# Patient Record
Sex: Female | Born: 1940
Health system: Southern US, Community
[De-identification: ages and names within clinical notes are randomized; demographics above are authoritative.]

## PROBLEM LIST (undated history)

## (undated) DIAGNOSIS — D649 Anemia, unspecified: Secondary | ICD-10-CM

## (undated) DIAGNOSIS — E349 Endocrine disorder, unspecified: Secondary | ICD-10-CM

## (undated) DIAGNOSIS — I428 Other cardiomyopathies: Secondary | ICD-10-CM

## (undated) DIAGNOSIS — I509 Heart failure, unspecified: Secondary | ICD-10-CM

## (undated) DIAGNOSIS — N189 Chronic kidney disease, unspecified: Secondary | ICD-10-CM

## (undated) DIAGNOSIS — M1990 Unspecified osteoarthritis, unspecified site: Secondary | ICD-10-CM

## (undated) DIAGNOSIS — I429 Cardiomyopathy, unspecified: Secondary | ICD-10-CM

## (undated) DIAGNOSIS — K219 Gastro-esophageal reflux disease without esophagitis: Secondary | ICD-10-CM

## (undated) DIAGNOSIS — I2510 Atherosclerotic heart disease of native coronary artery without angina pectoris: Secondary | ICD-10-CM

## (undated) DIAGNOSIS — Z8739 Personal history of other diseases of the musculoskeletal system and connective tissue: Secondary | ICD-10-CM

## (undated) DIAGNOSIS — G63 Polyneuropathy in diseases classified elsewhere: Secondary | ICD-10-CM

## (undated) DIAGNOSIS — I10 Essential (primary) hypertension: Secondary | ICD-10-CM

## (undated) HISTORY — PX: NEUROPLASTY / TRANSPOSITION MEDIAN NERVE AT CARPAL TUNNEL: SUR893

## (undated) HISTORY — PX: APPENDECTOMY: SHX54

## (undated) HISTORY — DX: Personal history of other diseases of the musculoskeletal system and connective tissue: Z87.39

## (undated) HISTORY — DX: Heart failure, unspecified: I50.9

## (undated) HISTORY — DX: Anemia, unspecified: D64.9

## (undated) HISTORY — DX: Essential (primary) hypertension: I10

## (undated) HISTORY — PX: FOOT SURGERY: SHX648

---

## 1967-11-21 HISTORY — PX: CHOLECYSTECTOMY: SHX55

## 1969-11-20 HISTORY — PX: ABDOMINAL HYSTERECTOMY: SHX81

## 2007-11-21 HISTORY — PX: BACK SURGERY: SHX140

## 2010-11-22 ENCOUNTER — Ambulatory Visit (HOSPITAL_BASED_OUTPATIENT_CLINIC_OR_DEPARTMENT_OTHER): Payer: Medicare PPO | Admitting: Hematology and Oncology

## 2010-12-22 ENCOUNTER — Encounter (HOSPITAL_BASED_OUTPATIENT_CLINIC_OR_DEPARTMENT_OTHER): Payer: Medicare PPO | Admitting: Hematology and Oncology

## 2010-12-22 DIAGNOSIS — D509 Iron deficiency anemia, unspecified: Secondary | ICD-10-CM

## 2010-12-22 LAB — URINALYSIS, MICROSCOPIC - CHCC
Bilirubin (Urine): NEGATIVE
Leukocyte Esterase: NEGATIVE
RBC count: NEGATIVE (ref 0–2)
WBC, UA: NEGATIVE (ref 0–2)
pH: 5 (ref 4.6–8.0)

## 2010-12-22 LAB — CBC & DIFF AND RETIC
Basophils Absolute: 0 10*3/uL (ref 0.0–0.1)
EOS%: 1 % (ref 0.0–7.0)
Eosinophils Absolute: 0.1 10*3/uL (ref 0.0–0.5)
HCT: 30.9 % — ABNORMAL LOW (ref 34.8–46.6)
HGB: 10.2 g/dL — ABNORMAL LOW (ref 11.6–15.9)
Immature Retic Fract: 7 % (ref 0.00–10.70)
MCH: 29.1 pg (ref 25.1–34.0)
MONO#: 0.3 10*3/uL (ref 0.1–0.9)
NEUT#: 3.1 10*3/uL (ref 1.5–6.5)
RDW: 15.2 % — ABNORMAL HIGH (ref 11.2–14.5)
Retic %: 0.63 % (ref 0.50–1.50)
Retic Ct Abs: 22.11 10*3/uL (ref 18.30–72.70)
WBC: 4.8 10*3/uL (ref 3.9–10.3)
lymph#: 1.4 10*3/uL (ref 0.9–3.3)

## 2010-12-22 LAB — MORPHOLOGY: PLT EST: ADEQUATE

## 2010-12-26 LAB — PROTEIN ELECTROPHORESIS, SERUM, WITH REFLEX
Albumin ELP: 56.4 % (ref 55.8–66.1)
Alpha-1-Globulin: 4.3 % (ref 2.9–4.9)
Alpha-2-Globulin: 7.7 % (ref 7.1–11.8)
Gamma Globulin: 19.4 % — ABNORMAL HIGH (ref 11.1–18.8)
Total Protein, Serum Electrophoresis: 7.3 g/dL (ref 6.0–8.3)

## 2010-12-26 LAB — COMPREHENSIVE METABOLIC PANEL
ALT: 10 U/L (ref 0–35)
AST: 15 U/L (ref 0–37)
Creatinine, Ser: 1.35 mg/dL — ABNORMAL HIGH (ref 0.40–1.20)
Total Bilirubin: 0.4 mg/dL (ref 0.3–1.2)

## 2010-12-26 LAB — IRON AND TIBC
Iron: 51 ug/dL (ref 42–145)
UIBC: 243 ug/dL

## 2010-12-26 LAB — DIRECT ANTIGLOBULIN TEST (NOT AT ARMC)
DAT (Complement): NEGATIVE
DAT IgG: NEGATIVE

## 2010-12-26 LAB — HEMOGLOBINOPATHY EVALUATION
Hemoglobin Other: 0 % (ref 0.0–0.0)
Hgb A: 97.6 % (ref 96.8–97.8)

## 2010-12-26 LAB — LACTATE DEHYDROGENASE: LDH: 171 U/L (ref 94–250)

## 2010-12-28 ENCOUNTER — Encounter (HOSPITAL_BASED_OUTPATIENT_CLINIC_OR_DEPARTMENT_OTHER): Payer: Medicare PPO | Admitting: Hematology and Oncology

## 2010-12-28 DIAGNOSIS — D509 Iron deficiency anemia, unspecified: Secondary | ICD-10-CM

## 2011-01-04 ENCOUNTER — Encounter (HOSPITAL_BASED_OUTPATIENT_CLINIC_OR_DEPARTMENT_OTHER): Payer: Medicare PPO | Admitting: Hematology and Oncology

## 2011-01-04 DIAGNOSIS — D509 Iron deficiency anemia, unspecified: Secondary | ICD-10-CM

## 2011-03-27 ENCOUNTER — Other Ambulatory Visit: Payer: Self-pay | Admitting: Hematology and Oncology

## 2011-03-27 ENCOUNTER — Encounter (HOSPITAL_BASED_OUTPATIENT_CLINIC_OR_DEPARTMENT_OTHER): Payer: Medicare PPO | Admitting: Hematology and Oncology

## 2011-03-27 DIAGNOSIS — D509 Iron deficiency anemia, unspecified: Secondary | ICD-10-CM

## 2011-03-27 LAB — CBC WITH DIFFERENTIAL/PLATELET
BASO%: 0.2 % (ref 0.0–2.0)
Basophils Absolute: 0 10e3/uL (ref 0.0–0.1)
EOS%: 1.2 % (ref 0.0–7.0)
Eosinophils Absolute: 0.1 10e3/uL (ref 0.0–0.5)
HCT: 30.3 % — ABNORMAL LOW (ref 34.8–46.6)
HGB: 10.1 g/dL — ABNORMAL LOW (ref 11.6–15.9)
LYMPH%: 30.8 % (ref 14.0–49.7)
MCH: 29.3 pg (ref 25.1–34.0)
MCHC: 33.3 g/dL (ref 31.5–36.0)
MCV: 87.8 fL (ref 79.5–101.0)
MONO#: 0.3 10e3/uL (ref 0.1–0.9)
MONO%: 7.2 % (ref 0.0–14.0)
NEUT#: 2.6 10e3/uL (ref 1.5–6.5)
NEUT%: 60.6 % (ref 38.4–76.8)
Platelets: 184 10e3/uL (ref 145–400)
RBC: 3.45 10e6/uL — ABNORMAL LOW (ref 3.70–5.45)
RDW: 15.8 % — ABNORMAL HIGH (ref 11.2–14.5)
WBC: 4.3 10e3/uL (ref 3.9–10.3)
lymph#: 1.3 10e3/uL (ref 0.9–3.3)
nRBC: 0 % (ref 0–0)

## 2011-03-27 LAB — IRON AND TIBC
%SAT: 22 % (ref 20–55)
Iron: 54 ug/dL (ref 42–145)
TIBC: 247 ug/dL — ABNORMAL LOW (ref 250–470)
UIBC: 193 ug/dL

## 2011-03-27 LAB — BASIC METABOLIC PANEL
BUN: 33 mg/dL — ABNORMAL HIGH (ref 6–23)
CO2: 25 mEq/L (ref 19–32)
Chloride: 107 mEq/L (ref 96–112)
Glucose, Bld: 110 mg/dL — ABNORMAL HIGH (ref 70–99)
Potassium: 3.9 mEq/L (ref 3.5–5.3)

## 2011-03-27 LAB — FERRITIN: Ferritin: 106 ng/mL (ref 10–291)

## 2011-03-29 ENCOUNTER — Encounter (HOSPITAL_BASED_OUTPATIENT_CLINIC_OR_DEPARTMENT_OTHER): Payer: Medicare PPO | Admitting: Hematology and Oncology

## 2011-03-29 DIAGNOSIS — D509 Iron deficiency anemia, unspecified: Secondary | ICD-10-CM

## 2011-04-01 ENCOUNTER — Emergency Department (HOSPITAL_COMMUNITY)
Admission: EM | Admit: 2011-04-01 | Discharge: 2011-04-02 | Disposition: A | Discharge status: Home / Self Care | Payer: Medicare PPO | Attending: Emergency Medicine | Admitting: Emergency Medicine

## 2011-04-01 ENCOUNTER — Emergency Department (HOSPITAL_COMMUNITY): Payer: Medicare PPO

## 2011-04-01 DIAGNOSIS — M545 Low back pain, unspecified: Secondary | ICD-10-CM | POA: Insufficient documentation

## 2011-04-01 DIAGNOSIS — X500XXA Overexertion from strenuous movement or load, initial encounter: Secondary | ICD-10-CM | POA: Insufficient documentation

## 2011-04-01 DIAGNOSIS — T148XXA Other injury of unspecified body region, initial encounter: Secondary | ICD-10-CM | POA: Insufficient documentation

## 2011-04-01 DIAGNOSIS — R609 Edema, unspecified: Secondary | ICD-10-CM | POA: Insufficient documentation

## 2011-04-01 DIAGNOSIS — E119 Type 2 diabetes mellitus without complications: Secondary | ICD-10-CM | POA: Insufficient documentation

## 2011-04-01 DIAGNOSIS — I10 Essential (primary) hypertension: Secondary | ICD-10-CM | POA: Insufficient documentation

## 2011-06-14 ENCOUNTER — Inpatient Hospital Stay (HOSPITAL_COMMUNITY)
Admission: EM | Admit: 2011-06-14 | Discharge: 2011-06-16 | DRG: 683 | Disposition: A | Discharge status: Home / Self Care | Payer: Medicare HMO | Attending: Family Medicine | Admitting: Family Medicine

## 2011-06-14 DIAGNOSIS — D649 Anemia, unspecified: Secondary | ICD-10-CM | POA: Diagnosis present

## 2011-06-14 DIAGNOSIS — E871 Hypo-osmolality and hyponatremia: Secondary | ICD-10-CM | POA: Diagnosis present

## 2011-06-14 DIAGNOSIS — N390 Urinary tract infection, site not specified: Secondary | ICD-10-CM | POA: Diagnosis present

## 2011-06-14 DIAGNOSIS — E119 Type 2 diabetes mellitus without complications: Secondary | ICD-10-CM | POA: Diagnosis present

## 2011-06-14 DIAGNOSIS — R197 Diarrhea, unspecified: Secondary | ICD-10-CM | POA: Diagnosis present

## 2011-06-14 DIAGNOSIS — N179 Acute kidney failure, unspecified: Principal | ICD-10-CM | POA: Diagnosis present

## 2011-06-14 LAB — CBC
HCT: 32.1 % — ABNORMAL LOW (ref 36.0–46.0)
MCHC: 34 g/dL (ref 30.0–36.0)
MCV: 85.8 fL (ref 78.0–100.0)
Platelets: 181 10*3/uL (ref 150–400)
RDW: 14.1 % (ref 11.5–15.5)

## 2011-06-14 LAB — DIFFERENTIAL
Basophils Absolute: 0 10*3/uL (ref 0.0–0.1)
Lymphocytes Relative: 32 % (ref 12–46)
Lymphs Abs: 1.4 10*3/uL (ref 0.7–4.0)
Neutrophils Relative %: 53 % (ref 43–77)

## 2011-06-15 ENCOUNTER — Emergency Department (HOSPITAL_COMMUNITY): Payer: Medicare HMO

## 2011-06-15 LAB — COMPREHENSIVE METABOLIC PANEL
AST: 22 U/L (ref 0–37)
Albumin: 3.7 g/dL (ref 3.5–5.2)
BUN: 43 mg/dL — ABNORMAL HIGH (ref 6–23)
Creatinine, Ser: 3.09 mg/dL — ABNORMAL HIGH (ref 0.50–1.10)
Total Protein: 7.5 g/dL (ref 6.0–8.3)

## 2011-06-15 LAB — BASIC METABOLIC PANEL
CO2: 21 mEq/L (ref 19–32)
Calcium: 10 mg/dL (ref 8.4–10.5)
Creatinine, Ser: 2.82 mg/dL — ABNORMAL HIGH (ref 0.50–1.10)
GFR calc non Af Amer: 17 mL/min — ABNORMAL LOW (ref >60)

## 2011-06-15 LAB — HEMOGLOBIN A1C
Hgb A1c MFr Bld: 6.2 % — ABNORMAL HIGH (ref <5.7)
Mean Plasma Glucose: 131 mg/dL — ABNORMAL HIGH (ref <117)

## 2011-06-15 LAB — URINALYSIS, ROUTINE W REFLEX MICROSCOPIC
Glucose, UA: NEGATIVE mg/dL
Protein, ur: NEGATIVE mg/dL
Specific Gravity, Urine: 1.012 (ref 1.005–1.030)
pH: 5.5 (ref 5.0–8.0)

## 2011-06-15 LAB — URINE MICROSCOPIC-ADD ON

## 2011-06-15 LAB — LIPID PANEL
Cholesterol: 152 mg/dL (ref 0–200)
VLDL: 16 mg/dL (ref 0–40)

## 2011-06-15 LAB — GLUCOSE, CAPILLARY: Glucose-Capillary: 71 mg/dL (ref 70–99)

## 2011-06-16 LAB — GLUCOSE, CAPILLARY: Glucose-Capillary: 86 mg/dL (ref 70–99)

## 2011-06-16 LAB — BASIC METABOLIC PANEL
GFR calc Af Amer: 27 mL/min — ABNORMAL LOW (ref >60)
GFR calc non Af Amer: 22 mL/min — ABNORMAL LOW (ref >60)
Potassium: 3.2 mEq/L — ABNORMAL LOW (ref 3.5–5.1)
Sodium: 138 mEq/L (ref 135–145)

## 2011-06-16 LAB — DIFFERENTIAL
Basophils Absolute: 0 10*3/uL (ref 0.0–0.1)
Basophils Relative: 1 % (ref 0–1)
Eosinophils Relative: 1 % (ref 0–5)
Monocytes Absolute: 0.6 10*3/uL (ref 0.1–1.0)
Neutro Abs: 1.9 10*3/uL (ref 1.7–7.7)

## 2011-06-16 LAB — CBC
Hemoglobin: 9.5 g/dL — ABNORMAL LOW (ref 12.0–15.0)
MCHC: 34.2 g/dL (ref 30.0–36.0)
RDW: 14.4 % (ref 11.5–15.5)

## 2011-06-18 LAB — URINE CULTURE: Colony Count: 15000

## 2011-06-19 NOTE — H&P (Signed)
NAME:  ALEA, RYER NO.:  [id number]  MEDICAL RECORD NO.:  [id number]  LOCATION:  WLED                         FACILITY:  [facility]  PHYSICIAN:  Houston Siren, MD           DATE OF BIRTH:  July 13, 1941  DATE OF ADMISSION:  06/14/2011 DATE OF DISCHARGE:                             HISTORY & PHYSICAL   PRIMARY CARE PHYSICIAN:  Stacie Acres. Cliffton Asters, M.D.  ADVANCE DIRECTIVE:  Full code.  REASON FOR ADMISSION:  Acute renal failure.  HISTORY OF PRESENT ILLNESS:  This is a 70 year old female with history of diabetes type 2, hypertension, hypercholesterolemia who was recently started on metformin, developed watery diarrhea, presents to her primary care where a creatinine was found to be elevated.  She was subsequently sent to the emergency room where evaluation confirmed a creatinine of 3.0 along with concomitant serum sodium of 128.  Review of her records showed that she has been on captopril along with HCTZ.  She also has aurinalysis, which was positive for evidence of a urinary tract infection as well.  She clinically was dehydrated.  She denied any nonsteroidal anti-inflammatory drug use.  Hospitalist was asked to admit the patient because of acute renal failure.  PAST MEDICAL HISTORY: 1. Diabetes. 2. Hypertension. 3. Hypercholesterolemia. 4. Obesity.  FAMILY HISTORY:  Diabetes and hypertension.  SOCIAL HISTORY:  She is divorced with 3 children.  She is retired and denied tobacco, alcohol, or drug use.  ALLERGIES:  SULFA.  CURRENT MEDICATIONS:  Norvasc, captopril, HCTZ, omeprazole, pravastatin, ophthalmic tobramycin, vitamin D, metformin, and glipizide.  PHYSICAL EXAMINATION:  GENERAL:  She is alert and oriented and is in no apparent distress. VITAL SIGNS:  Blood pressure 140/57, pulse of 61, respiratory rate of 17, temperature 97.8. HEENT:  She has facial symmetry and fluent speech.  Tongue is midline. NECK:  Supple.  No carotid bruit. CARDIAC:  S1 and S2  regular.  I did not hear murmur, rub, or gallop. LUNGS:  Clear. ABDOMEN:  Slightly obese, nondistended, nontender.  No renal bruit. EXTREMITIES:  No edema.  Good distal pulses bilaterally.  No calf tenderness. SKIN:  Warm and dry.  She has no asterixis. NEUROLOGIC:  Unremarkable. PSYCHIATRIC:  Unremarkable.  OBJECTIVE FINDINGS:  Serum sodium 128, potassium 3.4, glucose 134, creatinine 3.04, BUN 43.  Liver function tests are normal.  White count of 4500, hemoglobin of 10.9, MCV of 85, platelet count of 181,000. Urinalysis shows that there is cloudy with moderate leukocyte esterase, many bacteria, and too numerous to count WBCs.  IMPRESSION AND PLAN:  This is a 70 year old with diabetes, hypertension, hypercholesterolemia, admitted for acute renal failure.  I suspect that this is prerenal, and it is likely because of the diuretic along with the diarrhea.  I think the diarrhea is indeed from the metformin.  She is volume depleted as evident with hyponatremia as well.  We will need to admit her and give normal saline.  I would discontinue metformin, hold Capoten, and discontinue HCTZ.  I suspect that her creatinine will normalize.  Even though it admittedly low yield, we will send stool for studies to include Clostridium difficile and C and S.  We will  treat her urinary tract infection with Rocephin.  She is stable and we will admit her to [facility].  She is a  full code.     Houston Siren, MD     PL/MEDQ  D:  06/15/2011  T:  06/15/2011  Job:  [id number]  Electronically Signed by Houston Siren  on 06/19/2011 03:57:46 AM

## 2011-06-29 NOTE — Discharge Summary (Signed)
NAME:  NICKOL, COLLISTER NO.:  [id number]  MEDICAL RECORD NO.:  [id number]  LOCATION:  1513                         FACILITY:  [facility]  PHYSICIAN:  Brendia Sacks, MD    DATE OF BIRTH:  July 26, 1941  DATE OF ADMISSION:  06/14/2011 DATE OF DISCHARGE:  06/16/2011                              DISCHARGE SUMMARY   PRIMARY CARE PHYSICIAN:  Stacie Acres. Cliffton Asters, M.D.  PRIMARY NEPHROLOGIST:  Wilber Bihari. Caryn Section, M.D.  CONDITION ON DISCHARGE:  Improved.  DISPOSITION:  Home.  DISCHARGE DIAGNOSES: 1. Acute renal failure. 2. Hyponatremia. 3. Diabetes mellitus type 2, stable. 4. Urinary tract infection. 5. Normocytic anemia.  HISTORY OF PRESENT ILLNESS:  This is a 70 year old woman who developed watery diarrhea and saw her primary care physician for the same. Laboratory studies revealed elevated creatinine, and she was referred to the emergency room for further evaluation for a creatinine of 3.0 and the sodium of 128.  Of note, patient is also on captopril and hydrochlorothiazide at that time.  Urinary tract infection was also noted.  HOSPITAL COURSE:  The patient was admitted to the medical floor.  She was placed on IV fluids.  She has had excellent urine output and creatinine continues to trend downwards.  Her electrolytes are otherwise unremarkable except for mild hypokalemia.  Metformin has been discontinued.  She has excellent control of her diabetes, will continue on glipizide alone.  Her hyponatremia has resolved with fluid repletion. Presumably, her renal failure is multifactorial in nature including diarrhea superimposed, on diuretic and ACE inhibitor therapy.  Her blood pressure is stable, on Norvasc at this point and her diuretic and ACE inhibitor have been discontinued at this time.  Her normocytic anemia appears to be stable, and she continued iron in the outpatient setting. She will followup with her primary care physician next week, at which time basic  metabolic panel is suggested to can follow her creatinine. Once her creatinine normalizes, consider reinstituting ACE inhibitor therapy.  CONSULTATIONS:  None.  PROCEDURES:  None.  IMAGING:  Renal ultrasound, June 15, 2011:  No hydronephrosis. Echogenicity of the renal parenchyma is within normal limits.  PERTINENT LABORATORY STUDIES: 1. TSH within normal limits. 2. Hemoglobin A1c is 6.2. 3. Hemoglobin stable at 9.5. 4. Capillary blood sugars well controlled. 5. BUN 32 and creatinine 2.19 on discharge.  On admission BUN was 43     and creatinine was 3.09. 6. Sodium was 128 on admission and is 138 on discharge.  PHYSICAL EXAMINATION ON DISCHARGE:  GENERAL:  The patient is feeling well.  She has had no diarrhea.  She has excellent urine output. VITAL SIGNS:  Temperature is 97.8, pulse 63, respirations 18, blood pressure 110/64, sat 100% on room air.  Urine output 1350 over the last 24 hours. CARDIOVASCULAR:  Regular rate and rhythm.  No murmurs, rubs, or gallops. RESPIRATORY:  Clear to auscultation bilaterally.  No wheezes, rales, or rhonchi.  She has normal respiratory effort.  She appears well.  DISCHARGE INSTRUCTIONS: 1. The patient will be discharged home today. 2. Her diet is a heart-healthy diabetic diet. 3. Activities unrestricted. 4. Followup with Dr. Laurann Montana in 1 week.  DISCHARGE MEDICATIONS:  Amoxicillin 875  mg p.o. b.i.d. for urinary tract infection.  First dose on June 09, 2011 at 8 a.m. Resume: 1. Amlodipine 10 mg p.o. daily. 2. Glipizide XL 2.5 mg p.o. daily. 3. Multivitamins p.o. daily. 4. NU - iron 150 mg p.o. daily. 5. Omeprazole 40 mg p.o. daily. 6. Pravastatin 40 mg p.o. daily. 7. Vitamin D3 2000 units p.o. daily.  DISCONTINUED MEDICATIONS:  Discontinue the following medications: 1. Metformin. 2. Captopril. 3. Hydrochlorothiazide.  Things followup in the outpatient setting: 1. Resolution of acute renal failure.  Consider reinstituting ACE      inhibitor therapy.  Defer to her primary care physician.  The     patient has been followed by Dr. Caryn Section in the outpatient setting.     Most recent creatinine was 1.29, June 05, 2011 per his office. 2. Normocytic anemia further evaluation as clinically indicated. 3. Three urine culture results.  Time coordinating discharge is 20 minutes.     Brendia Sacks, MD     DG/MEDQ  D:  06/16/2011  T:  06/16/2011  Job:  [id number]  cc:   Stacie Acres. Cliffton Asters, M.D. Fax: [phone]  Wilber Bihari. Caryn Section, M.D. Fax: [phone]  Electronically Signed by Brendia Sacks  on 06/29/2011 05:36:33 PM

## 2011-09-01 ENCOUNTER — Encounter: Payer: Self-pay | Admitting: *Deleted

## 2011-09-22 ENCOUNTER — Encounter: Payer: Self-pay | Admitting: *Deleted

## 2011-09-26 ENCOUNTER — Other Ambulatory Visit: Payer: Self-pay | Admitting: Hematology and Oncology

## 2011-09-26 ENCOUNTER — Other Ambulatory Visit (HOSPITAL_BASED_OUTPATIENT_CLINIC_OR_DEPARTMENT_OTHER): Payer: Medicare HMO | Admitting: Lab

## 2011-09-26 DIAGNOSIS — D509 Iron deficiency anemia, unspecified: Secondary | ICD-10-CM

## 2011-09-26 LAB — CBC WITH DIFFERENTIAL/PLATELET
BASO%: 0.5 % (ref 0.0–2.0)
Eosinophils Absolute: 0.1 10*3/uL (ref 0.0–0.5)
MCV: 91.2 fL (ref 79.5–101.0)
MONO#: 0.4 10*3/uL (ref 0.1–0.9)
MONO%: 9.2 % (ref 0.0–14.0)
NEUT#: 2.5 10*3/uL (ref 1.5–6.5)
RBC: 3.41 10*6/uL — ABNORMAL LOW (ref 3.70–5.45)
RDW: 15.1 % — ABNORMAL HIGH (ref 11.2–14.5)
WBC: 4.4 10*3/uL (ref 3.9–10.3)

## 2011-09-26 LAB — IRON AND TIBC
%SAT: 18 % — ABNORMAL LOW (ref 20–55)
TIBC: 277 ug/dL (ref 250–470)

## 2011-09-26 LAB — FERRITIN: Ferritin: 110 ng/mL (ref 10–291)

## 2011-09-26 LAB — COMPREHENSIVE METABOLIC PANEL
BUN: 29 mg/dL — ABNORMAL HIGH (ref 6–23)
CO2: 26 mEq/L (ref 19–32)
Calcium: 9.8 mg/dL (ref 8.4–10.5)
Chloride: 105 mEq/L (ref 96–112)
Creatinine, Ser: 1.45 mg/dL — ABNORMAL HIGH (ref 0.50–1.10)

## 2011-09-29 ENCOUNTER — Ambulatory Visit (HOSPITAL_BASED_OUTPATIENT_CLINIC_OR_DEPARTMENT_OTHER): Payer: Medicare HMO | Admitting: Hematology and Oncology

## 2011-09-29 VITALS — BP 115/67 | HR 79 | Temp 97.4°F | Ht 65.0 in | Wt 233.7 lb

## 2011-09-29 DIAGNOSIS — E119 Type 2 diabetes mellitus without complications: Secondary | ICD-10-CM

## 2011-09-29 DIAGNOSIS — N289 Disorder of kidney and ureter, unspecified: Secondary | ICD-10-CM

## 2011-09-29 DIAGNOSIS — D638 Anemia in other chronic diseases classified elsewhere: Secondary | ICD-10-CM

## 2011-09-29 DIAGNOSIS — D509 Iron deficiency anemia, unspecified: Secondary | ICD-10-CM

## 2011-09-29 DIAGNOSIS — D539 Nutritional anemia, unspecified: Secondary | ICD-10-CM

## 2011-09-29 NOTE — Progress Notes (Signed)
CC:   Mandy Rhodes, M.D. Richard F. Caryn Section, M.D. Shirley Friar, MD  IDENTIFYING STATEMENT:  The patient is a 70 year old woman with anemia who presents for followup.  INTERVAL HISTORY:  Mandy Rhodes was last seen 6 months ago.  Since that time has had no major concerns.  She has good energy levels and is able to complete all ADLs.  Has no evidence of bleeding.  Continues on Nu- Iron daily.  Has not had any change in her bowel function.  LABORATORY DATA:  Review of lab data obtained 09/26/2011:  White cell count 4.4, hemoglobin 10.3, hematocrit 31.1, platelets 205; BUN and creatinine 29 and 1.45 respectively; ferritin 110 (106), iron 49, TIBC 277, saturation 18%.  MEDICATIONS:  Medications documented and as in nursing intake sheet.  ALLERGIES:  Sulfa.  PHYSICAL EXAMINATION:  General:  Well-appearing, well-nourished woman in no distress.  Vitals:  Pulse 79, blood pressure 105/67, temperature 97.4, respirations 20, weight 233 pounds.  HEENT:  Head is atraumatic, normocephalic.  Sclerae anicteric.  Mouth moist.  Neck:  Supple. Chest/CVS:  Unremarkable.  Abdomen:  Soft.  Bowel sounds present. Extremities:  No edema.  LABORATORY DATA:  Lab data as above.  In addition, sodium 140, potassium 3.7, chloride 105, CO2 of 26, BUN 109, T-bili 0.2, alkaline phosphatase 112, AST 14, ALT 12, calcium 9.8.  IMPRESSION AND PLAN:  Mandy Rhodes is a 70 year old woman with mixture of iron deficiency and anemia of chronic disease.  She has underlying diabetes and also mild renal insufficiency.  She has more than adequate iron levels, thus will not benefit from IV iron at this time. She is to continue with oral iron.  She routinely follows up in 6 months' time.    ______________________________ Laurice Record, M.D. LIO/MEDQ  D:  09/29/2011  T:  09/29/2011  Job:  [id number]

## 2011-09-29 NOTE — Progress Notes (Signed)
This office note has been dictated.

## 2012-06-04 ENCOUNTER — Telehealth: Payer: Self-pay | Admitting: Hematology and Oncology

## 2012-06-04 NOTE — Telephone Encounter (Signed)
Moved 7/23 appt to 8/1 w/JH per LO (PAL). lmonvm for pt and mailed new schedule. Also confirmed 7/19 appt.

## 2012-06-07 ENCOUNTER — Other Ambulatory Visit (HOSPITAL_BASED_OUTPATIENT_CLINIC_OR_DEPARTMENT_OTHER): Payer: Medicare HMO | Admitting: Lab

## 2012-06-07 DIAGNOSIS — D638 Anemia in other chronic diseases classified elsewhere: Secondary | ICD-10-CM

## 2012-06-07 LAB — CBC WITH DIFFERENTIAL/PLATELET
BASO%: 0.8 % (ref 0.0–2.0)
Basophils Absolute: 0 10*3/uL (ref 0.0–0.1)
EOS%: 1.6 % (ref 0.0–7.0)
HCT: 30 % — ABNORMAL LOW (ref 34.8–46.6)
HGB: 9.8 g/dL — ABNORMAL LOW (ref 11.6–15.9)
MCH: 29.3 pg (ref 25.1–34.0)
MCHC: 32.5 g/dL (ref 31.5–36.0)
MONO#: 0.4 10*3/uL (ref 0.1–0.9)
NEUT%: 65.9 % (ref 38.4–76.8)
RDW: 16.4 % — ABNORMAL HIGH (ref 11.2–14.5)
WBC: 4.8 10*3/uL (ref 3.9–10.3)
lymph#: 1.1 10*3/uL (ref 0.9–3.3)

## 2012-06-07 LAB — IRON AND TIBC
%SAT: 21 % (ref 20–55)
TIBC: 263 ug/dL (ref 250–470)
UIBC: 207 ug/dL (ref 125–400)

## 2012-06-07 LAB — BASIC METABOLIC PANEL
BUN: 24 mg/dL — ABNORMAL HIGH (ref 6–23)
Calcium: 9.7 mg/dL (ref 8.4–10.5)
Glucose, Bld: 132 mg/dL — ABNORMAL HIGH (ref 70–99)
Potassium: 4.2 mEq/L (ref 3.5–5.3)

## 2012-06-12 ENCOUNTER — Ambulatory Visit: Payer: Medicare HMO | Admitting: Hematology and Oncology

## 2012-06-20 ENCOUNTER — Telehealth: Payer: Self-pay | Admitting: Hematology and Oncology

## 2012-06-20 ENCOUNTER — Ambulatory Visit (HOSPITAL_BASED_OUTPATIENT_CLINIC_OR_DEPARTMENT_OTHER): Payer: Medicare HMO | Admitting: Family

## 2012-06-20 ENCOUNTER — Encounter: Payer: Self-pay | Admitting: Family

## 2012-06-20 VITALS — BP 120/70 | HR 75 | Temp 97.8°F | Ht 65.0 in | Wt 241.5 lb

## 2012-06-20 DIAGNOSIS — N289 Disorder of kidney and ureter, unspecified: Secondary | ICD-10-CM

## 2012-06-20 DIAGNOSIS — D539 Nutritional anemia, unspecified: Secondary | ICD-10-CM

## 2012-06-20 DIAGNOSIS — D509 Iron deficiency anemia, unspecified: Secondary | ICD-10-CM

## 2012-06-20 DIAGNOSIS — E119 Type 2 diabetes mellitus without complications: Secondary | ICD-10-CM

## 2012-06-20 DIAGNOSIS — D638 Anemia in other chronic diseases classified elsewhere: Secondary | ICD-10-CM

## 2012-06-20 NOTE — Progress Notes (Signed)
Patient ID: Mandy Rhodes, female   DOB: 08-26-1941, 71 y.o.   MRN: [id number] CSN: [id number]  CC: Stacie Acres. Cliffton Asters, M.D.  Richard F. Caryn Section, M.D.  Shirley Friar, MD  Identifying Statement: Mandy Rhodes is a 71 y.o. African-American female with anemia who presents for follow-up.   Interval History: Mandy Rhodes was last seen 6 months ago in November, 2012.  Since that time she has not had any major concerns.  The patient did state that she does have bilateral lower extremity pain for which she receives steroid injections in her knees by an Orthopedist.  The patient continues oral iron supplementation daily with Nu-Iron.  The patient denies constipation or any other symptomatology.  Medications: Current Outpatient Prescriptions  Medication Sig Dispense Refill  . amLODipine (NORVASC) 5 MG tablet Take 5 mg by mouth daily.        . captopril (CAPOTEN) 50 MG tablet Take 50 mg by mouth 2 (two) times daily.        . cholecalciferol (VITAMIN D) 1000 UNITS tablet Take 2,000 Units by mouth daily.        Marland Kitchen glipiZIDE (GLUCOTROL XL) 2.5 MG 24 hr tablet Take 2.5 mg by mouth daily.        . hydrochlorothiazide (HYDRODIURIL) 12.5 MG tablet Take 12.5 mg by mouth daily.        Marland Kitchen ibuprofen (ADVIL,MOTRIN) 200 MG tablet Take 200 mg by mouth as needed.        . metFORMIN (GLUCOPHAGE-XR) 500 MG 24 hr tablet Take 1,000 mg by mouth daily.       . Multiple Vitamin (MULTIVITAMIN) tablet Take 1 tablet by mouth daily.        Marland Kitchen omeprazole (PRILOSEC) 40 MG capsule Take 40 mg by mouth daily.        . polysaccharide iron (NIFEREX) 150 MG CAPS capsule Take 150 mg by mouth daily.        . pravastatin (PRAVACHOL) 40 MG tablet Take 40 mg by mouth daily.         Allergies: Allergies  Allergen Reactions  . Sulfa Antibiotics Swelling    Family History: No family history on file.  Social History: History  Substance Use Topics  . Smoking status: Never Smoker   . Smokeless tobacco: Not on file  . Alcohol Use:       Review of Systems: 10 point review of systems was completed and is negative except as noted above.   Physical Exam: Blood pressure 120/70, pulse 75, temperature 97.8 F (36.6 C), temperature source Oral, height 5\' 5"  (1.651 m), weight 241 lb 8 oz (109.544 kg).   General appearance: Alert, cooperative, well developed, well nourished, obese, no apparent distress Head: Normocephalic, without obvious abnormality, atraumatic Eyes: Conjunctivae clear, PERRLA, EOMI  Nose: Nares, septum, mucosa are normal, no drainage or sinus tenderness Throat: Lips, mucosa, gums and tongue are normal, dentures for upper and lower dentition Resp: Clear to auscultation bilaterally Cardio: Regular rate and rhythm, S1, S2 normal, distant heart sounds, no murmur/click/rub/gallop GI: Soft, non-tender, distended, hypoactive bowel sounds, no organomegaly Extremities: Extremities normal, atraumatic, no cyanosis, generalized/dependent LE edema 2+  Laboratory Data: WC is 4.8,Neutrophils 3.1, hemoglobin 9.8, hematocrit 30.0, platelets 187, MCV 90.1, MCH 29.3, MCHC 32.5, RBC 3.33, RDW 16.4, iron 56, U. IBC 207, TIBC 263, percent sat 21 ferritin 102, sodium 139, potassium 4.2, chloride 107, CO2 22, glucose 132, BUN 24, creatinine 1.37, calcium 9.7  Impression/Plan: Mandy Rhodes is a 71 year old woman with mixture  of iron deficiency and anemia of chronic disease. She has underlying  diabetes and also mild renal insufficiency. She has adequate iron levels, and will contnue with oral iron therpay. She routinely follows up in 6 months' time (February 2014).   Mandy Devery NP-C 06/20/2012, 11:11 AM

## 2012-06-20 NOTE — Patient Instructions (Signed)
Patient ID: Mandy Rhodes,   DOB: Feb 21, 1941,  MRN: [id number]   [facility] Discharge Instructions  RECOMMENDATIONS MAD BY THE CONSULTANT AND ANY TEST RESULT(S) WILL BE FORWARDED TO YOU REFERRING DOCTOR   EXAM FINDINGS BY NURSE PRACTITIONER TODAY TO REPORT TO THE CLINIC OR PRIMARY PROVIDER:    Your Current Medications Are: Current Outpatient Prescriptions  Medication Sig Dispense Refill  . amLODipine (NORVASC) 5 MG tablet Take 5 mg by mouth daily.        . captopril (CAPOTEN) 50 MG tablet Take 50 mg by mouth 2 (two) times daily.        . cholecalciferol (VITAMIN D) 1000 UNITS tablet Take 2,000 Units by mouth daily.        Marland Kitchen glipiZIDE (GLUCOTROL XL) 2.5 MG 24 hr tablet Take 2.5 mg by mouth daily.        . hydrochlorothiazide (HYDRODIURIL) 12.5 MG tablet Take 12.5 mg by mouth daily.        Marland Kitchen ibuprofen (ADVIL,MOTRIN) 200 MG tablet Take 200 mg by mouth as needed.        . metFORMIN (GLUCOPHAGE-XR) 500 MG 24 hr tablet Take 1,000 mg by mouth daily.       . Multiple Vitamin (MULTIVITAMIN) tablet Take 1 tablet by mouth daily.        Marland Kitchen omeprazole (PRILOSEC) 40 MG capsule Take 40 mg by mouth daily.        . polysaccharide iron (NIFEREX) 150 MG CAPS capsule Take 150 mg by mouth daily.        . pravastatin (PRAVACHOL) 40 MG tablet Take 40 mg by mouth daily.           INSTRUCTIONS GIVEN, DISCUSSED AND FOLLOW-UP: Your doing great, please continue doing what you are doing.  I acknowledge that I have been informed and understand all the instructions given to me and have received a copy.  I do not have any further questions at this time, but I understand that I may call the [facility] at [phone] during business hours should I have any further questions or need assistance in obtaining follow-up care.   06/20/2012, 10:52 AM

## 2012-06-20 NOTE — Telephone Encounter (Signed)
appts and printed for pt aom °

## 2012-12-27 ENCOUNTER — Other Ambulatory Visit (HOSPITAL_BASED_OUTPATIENT_CLINIC_OR_DEPARTMENT_OTHER): Payer: Medicare HMO | Admitting: Lab

## 2012-12-27 DIAGNOSIS — D509 Iron deficiency anemia, unspecified: Secondary | ICD-10-CM

## 2012-12-27 DIAGNOSIS — D539 Nutritional anemia, unspecified: Secondary | ICD-10-CM

## 2012-12-27 LAB — IRON AND TIBC
Iron: 33 ug/dL — ABNORMAL LOW (ref 42–145)
UIBC: 273 ug/dL (ref 125–400)

## 2012-12-27 LAB — CBC WITH DIFFERENTIAL/PLATELET
EOS%: 1.8 % (ref 0.0–7.0)
MCH: 29.5 pg (ref 25.1–34.0)
MCHC: 33.2 g/dL (ref 31.5–36.0)
MCV: 88.8 fL (ref 79.5–101.0)
MONO%: 8 % (ref 0.0–14.0)
NEUT#: 3.1 10*3/uL (ref 1.5–6.5)
RBC: 3.49 10*6/uL — ABNORMAL LOW (ref 3.70–5.45)
RDW: 15.9 % — ABNORMAL HIGH (ref 11.2–14.5)

## 2012-12-27 LAB — COMPREHENSIVE METABOLIC PANEL (CC13)
CO2: 22 mEq/L (ref 22–29)
Calcium: 10 mg/dL (ref 8.4–10.4)
Chloride: 108 mEq/L — ABNORMAL HIGH (ref 98–107)
Creatinine: 2 mg/dL — ABNORMAL HIGH (ref 0.6–1.1)
Glucose: 108 mg/dl — ABNORMAL HIGH (ref 70–99)
Total Bilirubin: 0.37 mg/dL (ref 0.20–1.20)
Total Protein: 7.4 g/dL (ref 6.4–8.3)

## 2012-12-31 ENCOUNTER — Ambulatory Visit (HOSPITAL_BASED_OUTPATIENT_CLINIC_OR_DEPARTMENT_OTHER): Payer: Medicare HMO | Admitting: Physician Assistant

## 2012-12-31 ENCOUNTER — Other Ambulatory Visit: Payer: Medicare HMO | Admitting: Lab

## 2012-12-31 VITALS — BP 138/64 | HR 78 | Temp 97.3°F | Resp 20 | Ht 65.0 in | Wt 248.4 lb

## 2012-12-31 DIAGNOSIS — D509 Iron deficiency anemia, unspecified: Secondary | ICD-10-CM

## 2012-12-31 DIAGNOSIS — D631 Anemia in chronic kidney disease: Secondary | ICD-10-CM

## 2012-12-31 DIAGNOSIS — N289 Disorder of kidney and ureter, unspecified: Secondary | ICD-10-CM

## 2012-12-31 DIAGNOSIS — D539 Nutritional anemia, unspecified: Secondary | ICD-10-CM

## 2012-12-31 NOTE — Progress Notes (Signed)
[facility]  [phone]   OFFICE PROGRESS NOTE  Rhodes,Mandy S, MD  PATIENT HISTORY:  Anemia of Chronic Disease Anemia, Iron deficiency H/o Renal Insufficiency Hypertension H/o Spinal Stenosis H/o Rotator Cuff Tear S/P Left Carpal Tunnel Repair S/P Cholecystectomy, 1969 S/P Partial Hysterectomy, 1971  INTERVAL HISTORY:  Mandy Rhodes returns to the Cancer Center for a 6 month follow up visit for Iron deficiency in the setting of Chronic Renal Insufficiency.. She continues to take oral Iron supplements daily with Niferex 150 mg caps. She tolerates the drug well without significant sides effects. She had a recent evaluation by her PCP, Dr. Cliffton Asters. Patient reports no new health issues. She is up to date with her mammograms, last performed at [facility] on October 2013, normal. She has also been seen by her Nephrologist, Dr. Caryn Section in January 2014. Her labs as of 12/27/2012 show Iron of 33 with  Ferritin levels of 71. TIBC and percent saturation are 306 and 11 respectively. No bleeding issues. No significant cold intolerance. She has gained weight, diet induced, from 141 lbs to 148 lbs today.   MEDICATIONS: Current Outpatient Prescriptions  Medication Sig Dispense Refill  . amLODipine (NORVASC) 5 MG tablet Take 5 mg by mouth daily.        . captopril (CAPOTEN) 50 MG tablet Take 50 mg by mouth 2 (two) times daily.        . cholecalciferol (VITAMIN D) 1000 UNITS tablet Take 2,000 Units by mouth daily.        Marland Kitchen glipiZIDE (GLUCOTROL XL) 2.5 MG 24 hr tablet Take 2.5 mg by mouth daily.        . hydrochlorothiazide (HYDRODIURIL) 12.5 MG tablet Take 12.5 mg by mouth daily.        Marland Kitchen ibuprofen (ADVIL,MOTRIN) 200 MG tablet Take 200 mg by mouth as needed.        . metFORMIN (GLUCOPHAGE-XR) 500 MG 24 hr tablet Take 1,000 mg by mouth daily.       . Multiple Vitamin (MULTIVITAMIN) tablet Take 1 tablet by mouth daily.        Marland Kitchen omeprazole (PRILOSEC) 40 MG capsule Take 40 mg by mouth  daily.        . polysaccharide iron (NIFEREX) 150 MG CAPS capsule Take 150 mg by mouth daily.        . pravastatin (PRAVACHOL) 40 MG tablet Take 40 mg by mouth daily.         No current facility-administered medications for this visit.    ALLERGIES:  is allergic to sulfa antibiotics.  REVIEW OF SYSTEMS:  The rest of the 14-point review of system was negative.   Filed Vitals:   12/31/12 1011  BP: 138/64  Pulse: 78  Temp: 97.3 F (36.3 C)  Resp: 20   Wt Readings from Last 3 Encounters:  12/31/12 248 lb 6.4 oz (112.674 kg)  06/20/12 241 lb 8 oz (109.544 kg)  09/29/11 233 lb 11.2 oz (106.006 kg)      PHYSICAL EXAMINATION:  General:  well-nourished in no acute distress. Alert and oriented x 3  HEENT:  Eyes:  no scleral icterus.No oropharyngeal lesions.  Neck was without   thyromegaly. No cervical, supraclavicular or axillary adenopathy. Respiratory: lungs were clear bilaterally without wheezing or crackles.  Cardiovascular:  Regular rate and rhythm, S1/S2, without murmur, rub or gallop.   GI:  abdomen was  Obese, soft, nontender, without organomegaly.   Musculoskeletal:  no spinal tenderness of palpation of vertebral spine.  Skin exam was without ecchymosis, petechiae.   Neuro exam: nonfocal.  Patient was able to get on and off exam table without assistance.    Attention was good.   Language was appropriate.  Mood was normal without depression.  Speech was not pressured.  Thought content was not tangential.      LABORATORY/RADIOLOGY DATA:   Recent Labs Lab 12/27/12 0849  WBC 4.9  HGB 10.3*  HCT 31.0*  PLT 179  MCV 88.8  MCH 29.5  MCHC 33.2  RDW 15.9*  LYMPHSABS 1.3  MONOABS 0.4  EOSABS 0.1  BASOSABS 0.0    CMP    Recent Labs Lab 12/27/12 0849  NA 141  K 3.9  CL 108*  CO2 22  GLUCOSE 108*  BUN 42.0*  CREATININE 2.0*  CALCIUM 10.0  AST 17  ALT 14  ALKPHOS 92  BILITOT 0.37        Component Value Date/Time   BILITOT 0.37 12/27/2012 0849   BILITOT  0.2* 09/26/2011 1703     Radiology Studies:  No results found.     ASSESSMENT AND PLAN:  Mandy Rhodes is a 72 year old woman with a mixed history of Iron Deficiency anemia and anemia of chronic disease, in the setting of mild renal insufficiency. Her Iron levels as of 12/27/12 are somewhat lower than previous at 33 but Ferritin levels of 71 . As of 06/07/12, Fe was 56 and Ferritin 102. She remains clinically stable; however, she has been instructed that increasing Niferex to bid may be beneficial, and she is considering to increase her dose. PAtient wishes to draw her interim labs at her PCP's office, that will be in 6 months, September 2014. She is to follow up with Dr. Arline Asp in one year, February 2015, with CBC, CMET, and Iron studies. She knows to call us if she has any questions or concerns.   Time spent extensively researching the patient's chart, Meeting her new Hematologist, Dr. Arline Asp  and time spent face to face was greater than 40 minutes.

## 2012-12-31 NOTE — Patient Instructions (Signed)
You Iron is a little lower than before, but if wish to improve the Iron levels then you may want to take Niferex 150 mg 2 times a day  You will have your labs checked by r. White in  6 months including Iron panel with Ferritin and return to Dr. Arline Asp in 1 year.

## 2013-01-02 ENCOUNTER — Telehealth: Payer: Self-pay | Admitting: Oncology

## 2013-01-02 NOTE — Telephone Encounter (Signed)
lmonvm for pt re appt for 12/30/2013. Schedule mailed.

## 2013-12-15 ENCOUNTER — Other Ambulatory Visit: Payer: Self-pay | Admitting: Nephrology

## 2013-12-15 DIAGNOSIS — Q619 Cystic kidney disease, unspecified: Secondary | ICD-10-CM

## 2013-12-18 ENCOUNTER — Ambulatory Visit
Admission: RE | Admit: 2013-12-18 | Discharge: 2013-12-18 | Disposition: A | Discharge status: Home / Self Care | Payer: Medicare HMO | Source: Ambulatory Visit | Attending: Nephrology | Admitting: Nephrology

## 2013-12-18 DIAGNOSIS — Q619 Cystic kidney disease, unspecified: Secondary | ICD-10-CM

## 2013-12-30 ENCOUNTER — Ambulatory Visit (HOSPITAL_BASED_OUTPATIENT_CLINIC_OR_DEPARTMENT_OTHER): Payer: Medicare HMO | Admitting: Internal Medicine

## 2013-12-30 ENCOUNTER — Other Ambulatory Visit (HOSPITAL_BASED_OUTPATIENT_CLINIC_OR_DEPARTMENT_OTHER): Payer: Medicare HMO

## 2013-12-30 ENCOUNTER — Encounter (INDEPENDENT_AMBULATORY_CARE_PROVIDER_SITE_OTHER): Payer: Self-pay

## 2013-12-30 ENCOUNTER — Telehealth: Payer: Self-pay | Admitting: Internal Medicine

## 2013-12-30 VITALS — BP 133/75 | HR 80 | Temp 97.0°F | Resp 18 | Ht 65.0 in | Wt 253.2 lb

## 2013-12-30 DIAGNOSIS — D539 Nutritional anemia, unspecified: Secondary | ICD-10-CM

## 2013-12-30 DIAGNOSIS — N189 Chronic kidney disease, unspecified: Secondary | ICD-10-CM

## 2013-12-30 DIAGNOSIS — D509 Iron deficiency anemia, unspecified: Secondary | ICD-10-CM

## 2013-12-30 DIAGNOSIS — D638 Anemia in other chronic diseases classified elsewhere: Secondary | ICD-10-CM

## 2013-12-30 DIAGNOSIS — N289 Disorder of kidney and ureter, unspecified: Secondary | ICD-10-CM

## 2013-12-30 LAB — CBC WITH DIFFERENTIAL/PLATELET
BASO%: 0.4 % (ref 0.0–2.0)
BASOS ABS: 0 10*3/uL (ref 0.0–0.1)
EOS%: 1.7 % (ref 0.0–7.0)
Eosinophils Absolute: 0.1 10*3/uL (ref 0.0–0.5)
HEMATOCRIT: 29.7 % — AB (ref 34.8–46.6)
HEMOGLOBIN: 9.6 g/dL — AB (ref 11.6–15.9)
LYMPH%: 28.7 % (ref 14.0–49.7)
MCH: 28.5 pg (ref 25.1–34.0)
MCHC: 32.3 g/dL (ref 31.5–36.0)
MCV: 88.1 fL (ref 79.5–101.0)
MONO#: 0.3 10*3/uL (ref 0.1–0.9)
MONO%: 6.5 % (ref 0.0–14.0)
NEUT#: 2.9 10*3/uL (ref 1.5–6.5)
NEUT%: 62.7 % (ref 38.4–76.8)
PLATELETS: 181 10*3/uL (ref 145–400)
RBC: 3.37 10*6/uL — ABNORMAL LOW (ref 3.70–5.45)
RDW: 16.1 % — ABNORMAL HIGH (ref 11.2–14.5)
WBC: 4.6 10*3/uL (ref 3.9–10.3)
lymph#: 1.3 10*3/uL (ref 0.9–3.3)

## 2013-12-30 LAB — COMPREHENSIVE METABOLIC PANEL (CC13)
ALT: 15 U/L (ref 0–55)
ANION GAP: 7 meq/L (ref 3–11)
AST: 15 U/L (ref 5–34)
Albumin: 3.8 g/dL (ref 3.5–5.0)
Alkaline Phosphatase: 86 U/L (ref 40–150)
BILIRUBIN TOTAL: 0.45 mg/dL (ref 0.20–1.20)
BUN: 25.8 mg/dL (ref 7.0–26.0)
CO2: 24 mEq/L (ref 22–29)
CREATININE: 1.5 mg/dL — AB (ref 0.6–1.1)
Calcium: 9.6 mg/dL (ref 8.4–10.4)
Chloride: 110 mEq/L — ABNORMAL HIGH (ref 98–109)
Glucose: 131 mg/dl (ref 70–140)
Potassium: 4.5 mEq/L (ref 3.5–5.1)
Sodium: 141 mEq/L (ref 136–145)
Total Protein: 7.3 g/dL (ref 6.4–8.3)

## 2013-12-30 LAB — VITAMIN B12: Vitamin B-12: 1137 pg/mL — ABNORMAL HIGH (ref 211–911)

## 2013-12-30 LAB — IRON AND TIBC CHCC
%SAT: 17 % — ABNORMAL LOW (ref 21–57)
Iron: 44 ug/dL (ref 41–142)
TIBC: 254 ug/dL (ref 236–444)
UIBC: 209 ug/dL (ref 120–384)

## 2013-12-30 LAB — FERRITIN CHCC: FERRITIN: 94 ng/mL (ref 9–269)

## 2013-12-30 NOTE — Patient Instructions (Signed)
Anemia, Nonspecific Anemia is a condition in which the concentration of red blood cells or hemoglobin in the blood is below normal. Hemoglobin is a substance in red blood cells that carries oxygen to the tissues of the body. Anemia results in not enough oxygen reaching these tissues.  CAUSES  Common causes of anemia include:   Excessive bleeding. Bleeding may be internal or external. This includes excessive bleeding from periods (in women) or from the intestine.   Poor nutrition.   Chronic kidney, thyroid, and liver disease.  Bone marrow disorders that decrease red blood cell production.  Cancer and treatments for cancer.  HIV, AIDS, and their treatments.  Spleen problems that increase red blood cell destruction.  Blood disorders.  Excess destruction of red blood cells due to infection, medicines, and autoimmune disorders. SIGNS AND SYMPTOMS   Minor weakness.   Dizziness.   Headache.  Palpitations.   Shortness of breath, especially with exercise.   Paleness.  Cold sensitivity.  Indigestion.  Nausea.  Difficulty sleeping.  Difficulty concentrating. Symptoms may occur suddenly or they may develop slowly.  DIAGNOSIS  Additional blood tests are often needed. These help your health care provider determine the best treatment. Your health care provider will check your stool for blood and look for other causes of blood loss.  TREATMENT  Treatment varies depending on the cause of the anemia. Treatment can include:   Supplements of iron, vitamin T06, or folic acid.   Hormone medicines.   A blood transfusion. This may be needed if blood loss is severe.   Hospitalization. This may be needed if there is significant continual blood loss.   Dietary changes.  Spleen removal. HOME CARE INSTRUCTIONS Keep all follow-up appointments. It often takes many weeks to correct anemia, and having your health care provider check on your condition and your response to  treatment is very important. SEEK IMMEDIATE MEDICAL CARE IF:   You develop extreme weakness, shortness of breath, or chest pain.   You become dizzy or have trouble concentrating.  You develop heavy vaginal bleeding.   You develop a rash.   You have bloody or black, tarry stools.   You faint.   You vomit up blood.   You vomit repeatedly.   You have abdominal pain.  You have a fever or persistent symptoms for more than 2 3 days.   You have a fever and your symptoms suddenly get worse.   You are dehydrated.  MAKE SURE YOU:  Understand these instructions.  Will watch your condition.  Will get help right away if you are not doing well or get worse. Document Released: 12/14/2004 Document Revised: 07/09/2013 Document Reviewed: 05/02/2013 [facility] Patient Information 2014 [place]. Chronic Kidney Disease Chronic kidney disease occurs when the kidneys are damaged over a long period. The kidneys are two organs that lie on either side of the spine between the middle of the back and the front of the abdomen. The kidneys:   Remove wastes and extra water from the blood.   Produce important hormones. These help keep bones strong, regulate blood pressure, and help create red blood cells.   Balance the fluids and chemicals in the blood and tissues. A small amount of kidney damage may not cause problems, but a large amount of damage may make it difficult or impossible for the kidneys to work the way they should. If steps are not taken to slow down the kidney damage or stop it from getting worse, the kidneys may stop working  permanently. Most of the time, chronic kidney disease does not go away. However, it can often be controlled, and those with the disease can usually live normal lives. CAUSES  The most common causes of chronic kidney disease are diabetes and high blood pressure (hypertension). Chronic kidney disease may also be caused by:   Diseases that cause  kidneys' filters to become inflamed.   Diseases that affect the immune system.   Genetic diseases.   Medicines that damage the kidneys, such as anti-inflammatory medicines.  Poisoning or exposure to toxic substances.   A reoccurring kidney or urinary infection.   A problem with urine flow. This may be caused by:   Cancer.   Kidney stones.   An enlarged prostate in males. SYMPTOMS  Because the kidney damage in chronic kidney disease occurs slowly, symptoms develop slowly and may not be obvious until the kidney damage becomes severe. A person may have a kidney disease for years without showing any symptoms. Symptoms can include:   Swelling (edema) of the legs, ankles, or feet.   Tiredness (lethargy).   Nausea or vomiting.   Confusion.   Problems with urination, such as:   Decreased urine production.   Frequent urination, especially at night.   Frequent accidents in children who are potty trained.   Muscle twitches and cramps.   Shortness of breath.  Weakness.   Persistent itchiness.   Loss of appetite.  Metallic taste in the mouth.  Trouble sleeping.  Slowed development in children.  Short stature in children. DIAGNOSIS  Chronic kidney disease may be detected and diagnosed by tests, including blood, urine, imaging, or kidney biopsy tests.  TREATMENT  Most chronic kidney diseases cannot be cured. Treatment usually involves relieving symptoms and preventing or slowing the progression of the disease. Treatment may include:   A special diet. You may need to avoid alcohol and foods thatare salty and high in potassium.   Medicines. These may:   Lower blood pressure.   Relieve anemia.   Relieve swelling.   Protect the bones. HOME CARE INSTRUCTIONS   Follow your prescribed diet.   Only take over-the-counter or prescription medicines as directed by your caregiver.  Do not take any new medicines (prescription,  over-the-counter, or nutritional supplements) unless approved by your caregiver. Many medicines can worsen your kidney damage or need to have the dose adjusted.   Quit smoking if you are a smoker. Talk to your caregiver about a smoking cessation program.   Keep all follow-up appointments as directed by your caregiver. SEEK IMMEDIATE MEDICAL CARE IF:  Your symptoms get worse or you develop new symptoms.   You develop symptoms of end-stage kidney disease. These include:   Headaches.   Abnormally dark or light skin.   Numbness in the hands or feet.   Easy bruising.   Frequent hiccups.   Menstruation stops.   You have a fever.   You have decreased urine production.   You havepain or bleeding when urinating. MAKE SURE YOU:  Understand these instructions.  Will watch your condition.  Will get help right away if you are not doing well or get worse. FOR MORE INFORMATION  American Association of Kidney Patients: BombTimer.gl National Kidney Foundation: www.kidney.[place]: https://mathis.com/ [facility]: www.lifeoptions.org and www.kidneyschool.org Document Released: 08/15/2008 Document Revised: 10/23/2012 Document Reviewed: 07/05/2012 [facility] Patient Information 2014 [place], [place].

## 2013-12-30 NOTE — Telephone Encounter (Signed)
Gave pt appt for lab and MD for 2016  °

## 2013-12-31 ENCOUNTER — Encounter: Payer: Self-pay | Admitting: Internal Medicine

## 2013-12-31 NOTE — Progress Notes (Signed)
[facility] OFFICE PROGRESS NOTE  Vidal Schwalbe, MD [place], [place] [id number]  DIAGNOSIS: Renal insufficiency - Plan: CBC with Differential, Comprehensive metabolic panel (Cmet) - [facility], Ferritin, Iron and TIBC  Unspecified deficiency anemia - Plan: CBC with Differential, Comprehensive metabolic panel (Cmet) - [facility], Ferritin, Iron and TIBC  Chief Complaint  Patient presents with  . Unspecified deficiency anemia    CURRENT THERAPY: Niferex 150 mg daily  INTERVAL HISTORY: Mandy Rhodes 73 y.o. female with a history of Iron deficiency in the setting of Chronic Renal Insufficiency is her for a one-year follow-up.  She continues to take oral Iron supplements daily with Niferex 150 mg caps. She tolerates the drug well without significant sides effects. She was recently seen by Dr. Baird Cancer her nephrologist with minor changes in her anti-hypertensive she reports.  She has not required an iron infusion since several years.   Her mammogram was last performed at [facility] on October 2013, normal.  She has gained weight to 253.2 pounds up from 248.6 pounds.  She denies any recent hospitalizations or emergency room visits.   MEDICAL HISTORY: Past Medical History  Diagnosis Date  . Anemia   . Diabetes mellitus   . Hypertension   . History of spinal stenosis   . History of rotator cuff tear     Torn right rotator cuff.    INTERIM HISTORY: has Unspecified deficiency anemia; Diabetes mellitus; and Renal insufficiency on her problem list.    ALLERGIES:  is allergic to sulfa antibiotics.  MEDICATIONS: has a current medication list which includes the following prescription(s): amitriptyline, amlodipine, captopril, cholecalciferol, glipizide, hydrochlorothiazide, ibuprofen, metformin, multivitamin, omeprazole, polysaccharide iron, and pravastatin.  SURGICAL HISTORY:  Past Surgical History  Procedure Laterality Date  . Neuroplasty / transposition median nerve  at carpal tunnel      Hx  Left carpal tunnel repair  . Cholecystectomy  1969    Status post   . Cholecystectomy  1969  . Abdominal hysterectomy  1971    Partial    REVIEW OF SYSTEMS:   Constitutional: Denies fevers, chills or abnormal weight loss but a 5 lb weight gain  Eyes: Denies blurriness of vision Ears, nose, mouth, throat, and face: Denies mucositis or sore throat Respiratory: Denies cough, dyspnea or wheezes Cardiovascular: Denies palpitation, chest discomfort or lower extremity swelling Gastrointestinal:  Denies nausea, heartburn or change in bowel habits Skin: Denies abnormal skin rashes Lymphatics: Denies new lymphadenopathy or easy bruising Neurological:Denies numbness, tingling or new weaknesses Behavioral/Psych: Mood is stable, no new changes  All other systems were reviewed with the patient and are negative.  PHYSICAL EXAMINATION: ECOG PERFORMANCE STATUS: 1 - Symptomatic but completely ambulatory  Blood pressure 133/75, pulse 80, temperature 97 F (36.1 C), temperature source Oral, resp. rate 18, height 5\' 5"  (1.651 m), weight 253 lb 3.2 oz (114.851 kg).  GENERAL:alert, no distress and comfortable; obese, ambulates with a walker.  SKIN: skin color, texture, turgor are normal, no rashes or significant lesions EYES: normal, Conjunctiva are pink and non-injected, sclera clear OROPHARYNX:no exudate, no erythema and lips, buccal mucosa, and tongue normal  NECK: supple, thyroid normal size, non-tender, without nodularity LYMPH:  no palpable lymphadenopathy in the cervical, axillary or supraclavicular LUNGS: clear to auscultation and percussion with normal breathing effort HEART: regular rate & rhythm and no murmurs and trace extremity edema ABDOMEN:abdomen soft, non-tender and normal bowel sounds Musculoskeletal:no cyanosis of digits and no clubbing  NEURO: alert & oriented x 3  with fluent speech, no focal motor/sensory deficits   LABORATORY DATA: Results for orders  placed in visit on 12/30/13 (from the past 48 hour(s))  CBC WITH DIFFERENTIAL     Status: Abnormal   Collection Time    12/30/13 10:14 AM      Result Value Ref Range   WBC 4.6  3.9 - 10.3 10e3/uL   NEUT# 2.9  1.5 - 6.5 10e3/uL   HGB 9.6 (*) 11.6 - 15.9 g/dL   HCT 29.7 (*) 34.8 - 46.6 %   Platelets 181  145 - 400 10e3/uL   MCV 88.1  79.5 - 101.0 fL   MCH 28.5  25.1 - 34.0 pg   MCHC 32.3  31.5 - 36.0 g/dL   RBC 3.37 (*) 3.70 - 5.45 10e6/uL   RDW 16.1 (*) 11.2 - 14.5 %   lymph# 1.3  0.9 - 3.3 10e3/uL   MONO# 0.3  0.1 - 0.9 10e3/uL   Eosinophils Absolute 0.1  0.0 - 0.5 10e3/uL   Basophils Absolute 0.0  0.0 - 0.1 10e3/uL   NEUT% 62.7  38.4 - 76.8 %   LYMPH% 28.7  14.0 - 49.7 %   MONO% 6.5  0.0 - 14.0 %   EOS% 1.7  0.0 - 7.0 %   BASO% 0.4  0.0 - 2.0 %  VITAMIN B12     Status: Abnormal   Collection Time    12/30/13 10:14 AM      Result Value Ref Range   Vitamin B-12 1137 (*) 211 - 911 pg/mL  COMPREHENSIVE METABOLIC PANEL (0000000)     Status: Abnormal   Collection Time    12/30/13 10:14 AM      Result Value Ref Range   Sodium 141  136 - 145 mEq/L   Potassium 4.5  3.5 - 5.1 mEq/L   Chloride 110 (*) 98 - 109 mEq/L   CO2 24  22 - 29 mEq/L   Glucose 131  70 - 140 mg/dl   BUN 25.8  7.0 - 26.0 mg/dL   Creatinine 1.5 (*) 0.6 - 1.1 mg/dL   Total Bilirubin 0.45  0.20 - 1.20 mg/dL   Alkaline Phosphatase 86  40 - 150 U/L   AST 15  5 - 34 U/L   ALT 15  0 - 55 U/L   Total Protein 7.3  6.4 - 8.3 g/dL   Albumin 3.8  3.5 - 5.0 g/dL   Calcium 9.6  8.4 - 10.4 mg/dL   Anion Gap 7  3 - 11 mEq/L  FERRITIN [facility]     Status: None   Collection Time    12/30/13 10:14 AM      Result Value Ref Range   Ferritin 94  9 - 269 ng/ml  IRON AND TIBC [facility]     Status: Abnormal   Collection Time    12/30/13 10:14 AM      Result Value Ref Range   Iron 44  41 - 142 ug/dL   TIBC 254  236 - 444 ug/dL   UIBC 209  120 - 384 ug/dL   %SAT 17 (*) 21 - 57 %     Labs:  Lab Results  Component Value Date   WBC  4.6 12/30/2013   HGB 9.6* 12/30/2013   HCT 29.7* 12/30/2013   MCV 88.1 12/30/2013   PLT 181 12/30/2013   NEUTROABS 2.9 12/30/2013      Chemistry      Component Value Date/Time   NA 141 12/30/2013 1014  NA 139 06/07/2012 0857   K 4.5 12/30/2013 1014   K 4.2 06/07/2012 0857   CL 108* 12/27/2012 0849   CL 107 06/07/2012 0857   CO2 24 12/30/2013 1014   CO2 22 06/07/2012 0857   BUN 25.8 12/30/2013 1014   BUN 24* 06/07/2012 0857   CREATININE 1.5* 12/30/2013 1014   CREATININE 1.37* 06/07/2012 0857      Component Value Date/Time   CALCIUM 9.6 12/30/2013 1014   CALCIUM 9.7 06/07/2012 0857   ALKPHOS 86 12/30/2013 1014   ALKPHOS 112 09/26/2011 1703   AST 15 12/30/2013 1014   AST 14 09/26/2011 1703   ALT 15 12/30/2013 1014   ALT 12 09/26/2011 1703   BILITOT 0.45 12/30/2013 1014   BILITOT 0.2* 09/26/2011 1703      Basic Metabolic Panel:  Recent Labs Lab 12/30/13 1014  NA 141  K 4.5  CO2 24  GLUCOSE 131  BUN 25.8  CREATININE 1.5*  CALCIUM 9.6   GFR Estimated Creatinine Clearance: 42.9 ml/min (by C-G formula based on Cr of 1.5). Liver Function Tests:  Recent Labs Lab 12/30/13 1014  AST 15  ALT 15  ALKPHOS 86  BILITOT 0.45  PROT 7.3  ALBUMIN 3.8   CBC:  Recent Labs Lab 12/30/13 1014  WBC 4.6  NEUTROABS 2.9  HGB 9.6*  HCT 29.7*  MCV 88.1  PLT 181   Anemia work up  Recent Labs  12/30/13 1014  VITAMINB12 1137*  FERRITIN 94  TIBC 254  IRON 44   Studies:  No results found.   RADIOGRAPHIC STUDIES: US Renal  12/18/2013   CLINICAL DATA:  Renal cysts.  EXAM: RENAL/URINARY TRACT ULTRASOUND COMPLETE  COMPARISON:  06/15/2011.  FINDINGS: Right Kidney:  Length: 10.3 cm in length. Echogenicity within normal limits. No mass or hydronephrosis visualized. In the upper pole of the right kidney there is a 2.4 x 2.4 x 2.3 cm anechoic lesion with increased through transmission and no internal blood flow, compatible with a simple cyst. A smaller 1.0 x 0.8 x 0.9 cm hypoechoic lesion with  increased through transmission is also noted in the lower aspect of the kidney at the junction of the interpolar and lower pole, favored to represent a small cyst (too small to characterize).  Left Kidney:  Length: 9.7 cm in length. Echogenicity within normal limits. No mass or hydronephrosis visualized. 1 cm hypoechoic lesion with increased through transmission in the interpolar region of the left kidney, favored to represent a small cyst (but too small to definitively characterize).  Bladder:  Appears normal for degree of bladder distention.  IMPRESSION: 1. Slight interval enlargement of simple cyst in the upper pole of the right kidney. 2. Additional smaller lesions in the kidneys bilaterally are favored to represent tiny cysts, but are too small to definitively characterize by ultrasound. 3. No hydronephrosis.   Electronically Signed   By: Vinnie Langton M.D.   On: 12/18/2013 11:25    ASSESSMENT: Mandy Rhodes 73 y.o. female with a history of Renal insufficiency - Plan: CBC with Differential, Comprehensive metabolic panel (Cmet) - [facility], Ferritin, Iron and TIBC  Unspecified deficiency anemia - Plan: CBC with Differential, Comprehensive metabolic panel (Cmet) - [facility], Ferritin, Iron and TIBC   PLAN:  1. Mixed anemia. -- She has a mixed history of Iron Deficiency anemia and anemia of chronic disease, in the setting of mild renal insufficiency. Her iron levels are stable.  She remains clinically stable;  she has been instructed to continue her  Niferex bid . She wishes to draw her interim labs at her PCP's office, that will be in 6 months, September 2015. She is to follow up February 2016, with CBC, CMET, and Iron studies. She knows to call us if she has any questions or concerns.   All questions were answered. The patient knows to call the clinic with any problems, questions or concerns. We can certainly see the patient much sooner if necessary.  I spent 15 minutes counseling the patient face to  face. The total time spent in the appointment was 25 minutes. Time was spent researching the patient's chart.     Jolyssa Oplinger, MD 12/31/2013 4:59 AM

## 2014-11-23 ENCOUNTER — Other Ambulatory Visit (INDEPENDENT_AMBULATORY_CARE_PROVIDER_SITE_OTHER): Payer: Self-pay | Admitting: Surgery

## 2014-12-01 ENCOUNTER — Encounter (HOSPITAL_COMMUNITY): Payer: Self-pay | Admitting: *Deleted

## 2014-12-02 MED ORDER — CEFAZOLIN SODIUM-DEXTROSE 2-3 GM-% IV SOLR
2.0000 g | INTRAVENOUS | Status: AC
  Administered 2014-12-03: 2 g via INTRAVENOUS
  Filled 2014-12-02: qty 50

## 2014-12-02 NOTE — H&P (Signed)
rances L. Duecker 11/23/2014 1:53 PM Location: [facility] Patient #: [id number] DOB: 1941-08-09 Undefined / Language: Cleophus Molt / Race: Black or African American Female  History of Present Illness (Ryder Chesmore A. Ninfa Linden MD; 11/23/2014 2:13 PM) Patient words: sebc. cyst on mid back.  The patient is a 74 year old female who presents with a complaint of Skin problems. This is a very pleasant lady referred by Dr. Harlan Stains for evaluation of a chronic infected sebaceous cyst. She has had 2 separate incision and drainage of a cyst on her right back over the past several years. The most recent was in October. She reports that she will still have occasional drainage from the cyst. She is diabetic. She is otherwise without complaints.   Other Problems Briant Cedar, CMA; 11/23/2014 1:54 PM) Arthritis Back Pain Cholelithiasis Diabetes Mellitus High blood pressure Hypercholesterolemia  Past Surgical History Briant Cedar, CMA; 11/23/2014 1:54 PM) Breast Biopsy Right. Cataract Surgery Bilateral. Foot Surgery Bilateral. Gallbladder Surgery - Open Hysterectomy (not due to cancer) - Partial Spinal Surgery - Neck  Diagnostic Studies History Briant Cedar, CMA; 11/23/2014 1:54 PM) Colonoscopy 5-10 years ago Mammogram within last year  Allergies Briant Cedar, [place]; 11/23/2014 1:59 PM) [facility]  Medication History Briant Cedar, CMA; 11/23/2014 2:00 PM) MetFORMIN HCl (1000MG  Tablet, Oral) Active. Pravastatin Sodium (40MG  Tablet, Oral) Active. AmLODIPine Besylate (5MG  Tablet, Oral) Active. Hydrochlorothiazide (25MG  Tablet, Oral) Active. Omeprazole (40MG  Capsule DR, Oral) Active. GlipiZIDE ER (2.5MG  Tablet ER 24HR, Oral) Active. Iron-Vitamins (Oral) Active. Nortriptyline HCl (10MG  Capsule, Oral) Active.  Social History Briant Cedar, [place]; 11/23/2014 1:54 PM) Alcohol use Remotely quit alcohol use. Caffeine use Carbonated beverages, Coffee,  Tea. No drug use Tobacco use Former smoker.  Family History Briant Cedar, [place]; 11/23/2014 1:54 PM) Arthritis Father. Diabetes Mellitus Mother. Heart Disease Father. Heart disease in female family member before age 69 Hypertension Father. Kidney Disease Father. Ovarian Cancer Sister.  Pregnancy / Birth History Briant Cedar, [place]; 11/23/2014 1:54 PM) Age at menarche 87 years. Age of menopause <45 Gravida 3 Para 3  Review of Systems Briant Cedar CMA; 11/23/2014 1:54 PM) General Not Present- Appetite Loss, Chills, Fatigue, Fever, Night Sweats, Weight Gain and Weight Loss. HEENT Not Present- Earache, Hearing Loss, Hoarseness, Nose Bleed, Oral Ulcers, Ringing in the Ears, Seasonal Allergies, Sinus Pain, Sore Throat, Visual Disturbances, Wears glasses/contact lenses and Yellow Eyes. Respiratory Not Present- Bloody sputum, Chronic Cough, Difficulty Breathing, Snoring and Wheezing. Breast Not Present- Breast Mass, Breast Pain, Nipple Discharge and Skin Changes. Cardiovascular Not Present- Chest Pain, Difficulty Breathing Lying Down, Leg Cramps, Palpitations, Rapid Heart Rate, Shortness of Breath and Swelling of Extremities. Gastrointestinal Not Present- Abdominal Pain, Bloating, Bloody Stool, Change in Bowel Habits, Chronic diarrhea, Constipation, Difficulty Swallowing, Excessive gas, Gets full quickly at meals, Hemorrhoids, Indigestion, Nausea, Rectal Pain and Vomiting. Female Genitourinary Present- Frequency. Not Present- Nocturia, Painful Urination, Pelvic Pain and Urgency. Musculoskeletal Present- Joint Pain. Not Present- Back Pain, Joint Stiffness, Muscle Pain, Muscle Weakness and Swelling of Extremities. Neurological Not Present- Decreased Memory, Fainting, Headaches, Numbness, Seizures, Tingling, Tremor, Trouble walking and Weakness. Psychiatric Not Present- Anxiety, Bipolar, Change in Sleep Pattern, Depression, Fearful and Frequent crying. Endocrine Not Present- Cold  Intolerance, Excessive Hunger, Hair Changes, Heat Intolerance, Hot flashes and New Diabetes. Hematology Not Present- Easy Bruising, Excessive bleeding, Gland problems, HIV and Persistent Infections.   Vitals Briant Cedar CMA; 11/23/2014 2:01 PM) 11/23/2014 2:01 PM Weight: 245 lb Height: 63in Body Surface Area: 2.22 m Body Mass Index: 43.4 kg/m Temp.:  97.56F  Pulse: 83 (Regular)  BP: 122/78 (Sitting, Left Arm, Standard)    Physical Exam (Bader Stubblefield A. Ninfa Linden MD; 11/23/2014 2:13 PM) The physical exam findings are as follows: Note:Well-developed well-nourished female in no distress walking with a walker. Lungs clear bilaterally. Cardiac is regular rate and rhythm There is a chronic sebaceous cyst which I could express sebaceous debris from on the right mid back laterally.    Assessment & Plan (Robert Sperl A. Ninfa Linden MD; 11/23/2014 2:14 PM) INFLAMED SEBACEOUS CYST (706.2  L72.3) Impression: Because of recurrent infections and her diabetes, excision of this cyst and surrounding skin is recommended to prevent further infections and recurrences. I discussed this with her in detail. I discussed the risk which includes but is not limited to bleeding, infection, recurrence, having a chronic open wound,

## 2014-12-03 ENCOUNTER — Encounter (HOSPITAL_COMMUNITY): Payer: Self-pay | Admitting: Critical Care Medicine

## 2014-12-03 ENCOUNTER — Encounter (HOSPITAL_COMMUNITY)
Admission: RE | Disposition: A | Discharge status: Home / Self Care | Payer: Self-pay | Source: Ambulatory Visit | Attending: Surgery

## 2014-12-03 ENCOUNTER — Ambulatory Visit (HOSPITAL_COMMUNITY): Payer: Medicare HMO | Admitting: Critical Care Medicine

## 2014-12-03 ENCOUNTER — Ambulatory Visit (HOSPITAL_COMMUNITY)
Admission: RE | Admit: 2014-12-03 | Discharge: 2014-12-03 | Disposition: A | Discharge status: Home / Self Care | Payer: Medicare HMO | Source: Ambulatory Visit | Attending: Surgery | Admitting: Surgery

## 2014-12-03 DIAGNOSIS — Z6841 Body Mass Index (BMI) 40.0 and over, adult: Secondary | ICD-10-CM | POA: Insufficient documentation

## 2014-12-03 DIAGNOSIS — Z79899 Other long term (current) drug therapy: Secondary | ICD-10-CM | POA: Diagnosis not present

## 2014-12-03 DIAGNOSIS — Z833 Family history of diabetes mellitus: Secondary | ICD-10-CM | POA: Diagnosis not present

## 2014-12-03 DIAGNOSIS — E119 Type 2 diabetes mellitus without complications: Secondary | ICD-10-CM | POA: Diagnosis not present

## 2014-12-03 DIAGNOSIS — M1990 Unspecified osteoarthritis, unspecified site: Secondary | ICD-10-CM | POA: Diagnosis not present

## 2014-12-03 DIAGNOSIS — L728 Other follicular cysts of the skin and subcutaneous tissue: Secondary | ICD-10-CM | POA: Insufficient documentation

## 2014-12-03 DIAGNOSIS — K219 Gastro-esophageal reflux disease without esophagitis: Secondary | ICD-10-CM | POA: Insufficient documentation

## 2014-12-03 DIAGNOSIS — Z87891 Personal history of nicotine dependence: Secondary | ICD-10-CM | POA: Insufficient documentation

## 2014-12-03 DIAGNOSIS — I10 Essential (primary) hypertension: Secondary | ICD-10-CM | POA: Diagnosis not present

## 2014-12-03 DIAGNOSIS — L723 Sebaceous cyst: Secondary | ICD-10-CM | POA: Diagnosis present

## 2014-12-03 DIAGNOSIS — E780 Pure hypercholesterolemia: Secondary | ICD-10-CM | POA: Diagnosis not present

## 2014-12-03 DIAGNOSIS — G629 Polyneuropathy, unspecified: Secondary | ICD-10-CM | POA: Insufficient documentation

## 2014-12-03 HISTORY — DX: Unspecified osteoarthritis, unspecified site: M19.90

## 2014-12-03 HISTORY — PX: CYST REMOVAL TRUNK: SHX6283

## 2014-12-03 HISTORY — DX: Polyneuropathy in diseases classified elsewhere: G63

## 2014-12-03 HISTORY — DX: Endocrine disorder, unspecified: E34.9

## 2014-12-03 HISTORY — DX: Gastro-esophageal reflux disease without esophagitis: K21.9

## 2014-12-03 HISTORY — DX: Chronic kidney disease, unspecified: N18.9

## 2014-12-03 LAB — GLUCOSE, CAPILLARY
GLUCOSE-CAPILLARY: 93 mg/dL (ref 70–99)
Glucose-Capillary: 101 mg/dL — ABNORMAL HIGH (ref 70–99)

## 2014-12-03 LAB — CBC
HCT: 31 % — ABNORMAL LOW (ref 36.0–46.0)
Hemoglobin: 10.2 g/dL — ABNORMAL LOW (ref 12.0–15.0)
MCH: 29.2 pg (ref 26.0–34.0)
MCHC: 32.9 g/dL (ref 30.0–36.0)
MCV: 88.8 fL (ref 78.0–100.0)
PLATELETS: 204 10*3/uL (ref 150–400)
RBC: 3.49 MIL/uL — ABNORMAL LOW (ref 3.87–5.11)
RDW: 15.8 % — ABNORMAL HIGH (ref 11.5–15.5)
WBC: 4.7 10*3/uL (ref 4.0–10.5)

## 2014-12-03 LAB — BASIC METABOLIC PANEL
ANION GAP: 12 (ref 5–15)
BUN: 26 mg/dL — ABNORMAL HIGH (ref 6–23)
CALCIUM: 9.8 mg/dL (ref 8.4–10.5)
CHLORIDE: 104 meq/L (ref 96–112)
CO2: 22 mmol/L (ref 19–32)
CREATININE: 1.84 mg/dL — AB (ref 0.50–1.10)
GFR calc Af Amer: 30 mL/min — ABNORMAL LOW (ref >90)
GFR calc non Af Amer: 26 mL/min — ABNORMAL LOW (ref >90)
GLUCOSE: 103 mg/dL — AB (ref 70–99)
Potassium: 4 mmol/L (ref 3.5–5.1)
Sodium: 138 mmol/L (ref 135–145)

## 2014-12-03 SURGERY — CYST REMOVAL TRUNK
Anesthesia: Monitor Anesthesia Care | Site: Back | Laterality: Right

## 2014-12-03 MED ORDER — MIDAZOLAM HCL 5 MG/5ML IJ SOLN
INTRAMUSCULAR | Status: DC | PRN
  Administered 2014-12-03 (×2): 1 mg via INTRAVENOUS

## 2014-12-03 MED ORDER — OXYCODONE-ACETAMINOPHEN 5-325 MG PO TABS: 1.0000 | ORAL_TABLET | ORAL | Status: DC | PRN

## 2014-12-03 MED ORDER — LIDOCAINE HCL (CARDIAC) 20 MG/ML IV SOLN
INTRAVENOUS | Status: AC
  Filled 2014-12-03: qty 5

## 2014-12-03 MED ORDER — LIDOCAINE HCL 1 % IJ SOLN
INTRAMUSCULAR | Status: DC | PRN
  Administered 2014-12-03: 30 mL

## 2014-12-03 MED ORDER — LIDOCAINE HCL (PF) 1 % IJ SOLN
INTRAMUSCULAR | Status: AC
  Filled 2014-12-03: qty 30

## 2014-12-03 MED ORDER — PROPOFOL 10 MG/ML IV BOLUS
INTRAVENOUS | Status: AC
  Filled 2014-12-03: qty 20

## 2014-12-03 MED ORDER — MIDAZOLAM HCL 2 MG/2ML IJ SOLN
INTRAMUSCULAR | Status: AC
  Filled 2014-12-03: qty 2

## 2014-12-03 MED ORDER — 0.9 % SODIUM CHLORIDE (POUR BTL) OPTIME
TOPICAL | Status: DC | PRN
  Administered 2014-12-03: 1000 mL

## 2014-12-03 MED ORDER — ROCURONIUM BROMIDE 50 MG/5ML IV SOLN
INTRAVENOUS | Status: AC
  Filled 2014-12-03: qty 1

## 2014-12-03 MED ORDER — BUPIVACAINE-EPINEPHRINE (PF) 0.25% -1:200000 IJ SOLN
INTRAMUSCULAR | Status: AC
  Filled 2014-12-03: qty 30

## 2014-12-03 MED ORDER — EPHEDRINE SULFATE 50 MG/ML IJ SOLN
INTRAMUSCULAR | Status: AC
  Filled 2014-12-03: qty 1

## 2014-12-03 MED ORDER — FENTANYL CITRATE 0.05 MG/ML IJ SOLN
INTRAMUSCULAR | Status: AC
  Filled 2014-12-03: qty 5

## 2014-12-03 MED ORDER — SUCCINYLCHOLINE CHLORIDE 20 MG/ML IJ SOLN
INTRAMUSCULAR | Status: AC
  Filled 2014-12-03: qty 1

## 2014-12-03 MED ORDER — LACTATED RINGERS IV SOLN
INTRAVENOUS | Status: DC | PRN
  Administered 2014-12-03: 07:00:00 via INTRAVENOUS

## 2014-12-03 MED ORDER — BUPIVACAINE-EPINEPHRINE 0.25% -1:200000 IJ SOLN
INTRAMUSCULAR | Status: DC | PRN
  Administered 2014-12-03: 30 mL

## 2014-12-03 MED ORDER — ARTIFICIAL TEARS OP OINT
TOPICAL_OINTMENT | OPHTHALMIC | Status: AC
  Filled 2014-12-03: qty 3.5

## 2014-12-03 MED ORDER — PROPOFOL INFUSION 10 MG/ML OPTIME
INTRAVENOUS | Status: DC | PRN
  Administered 2014-12-03: 25 ug/kg/min via INTRAVENOUS

## 2014-12-03 MED ORDER — FENTANYL CITRATE 0.05 MG/ML IJ SOLN
INTRAMUSCULAR | Status: DC | PRN
  Administered 2014-12-03 (×3): 25 ug via INTRAVENOUS

## 2014-12-03 MED ORDER — SODIUM CHLORIDE 0.9 % IJ SOLN
INTRAMUSCULAR | Status: AC
  Filled 2014-12-03: qty 10

## 2014-12-03 MED ORDER — ONDANSETRON HCL 4 MG/2ML IJ SOLN
INTRAMUSCULAR | Status: DC | PRN
  Administered 2014-12-03: 4 mg via INTRAVENOUS

## 2014-12-03 SURGICAL SUPPLY — 47 items
BENZOIN TINCTURE PRP APPL 2/3 (GAUZE/BANDAGES/DRESSINGS) ×2 IMPLANT
BLADE SURG 10 STRL SS (BLADE) ×2 IMPLANT
BLADE SURG 15 STRL LF DISP TIS (BLADE) ×1 IMPLANT
BLADE SURG 15 STRL SS (BLADE) ×1
CANISTER SUCTION 2500CC (MISCELLANEOUS) IMPLANT
COVER SURGICAL LIGHT HANDLE (MISCELLANEOUS) ×2 IMPLANT
DECANTER SPIKE VIAL GLASS SM (MISCELLANEOUS) ×2 IMPLANT
DRAPE LAPAROSCOPIC ABDOMINAL (DRAPES) IMPLANT
DRAPE PED LAPAROTOMY (DRAPES) ×2 IMPLANT
DRAPE UTILITY XL STRL (DRAPES) ×4 IMPLANT
ELECT CAUTERY BLADE 6.4 (BLADE) ×2 IMPLANT
ELECT REM PT RETURN 9FT ADLT (ELECTROSURGICAL) ×2
ELECTRODE REM PT RTRN 9FT ADLT (ELECTROSURGICAL) ×1 IMPLANT
GAUZE SPONGE 4X4 12PLY STRL (GAUZE/BANDAGES/DRESSINGS) IMPLANT
GLOVE BIO SURGEON STRL SZ7 (GLOVE) ×2 IMPLANT
GLOVE BIO SURGEON STRL SZ7.5 (GLOVE) ×2 IMPLANT
GLOVE BIOGEL PI IND STRL 7.0 (GLOVE) ×1 IMPLANT
GLOVE BIOGEL PI IND STRL 7.5 (GLOVE) ×3 IMPLANT
GLOVE BIOGEL PI INDICATOR 7.0 (GLOVE) ×1
GLOVE BIOGEL PI INDICATOR 7.5 (GLOVE) ×3
GLOVE SURG SIGNA 7.5 PF LTX (GLOVE) ×2 IMPLANT
GLOVE SURG SS PI 7.5 STRL IVOR (GLOVE) ×4 IMPLANT
GOWN STRL REUS W/ TWL LRG LVL3 (GOWN DISPOSABLE) ×2 IMPLANT
GOWN STRL REUS W/ TWL XL LVL3 (GOWN DISPOSABLE) ×3 IMPLANT
GOWN STRL REUS W/TWL LRG LVL3 (GOWN DISPOSABLE) ×2
GOWN STRL REUS W/TWL XL LVL3 (GOWN DISPOSABLE) ×3
KIT BASIN OR (CUSTOM PROCEDURE TRAY) ×2 IMPLANT
KIT ROOM TURNOVER OR (KITS) ×2 IMPLANT
LIQUID BAND (GAUZE/BANDAGES/DRESSINGS) ×2 IMPLANT
NEEDLE HYPO 25GX1X1/2 BEV (NEEDLE) ×2 IMPLANT
NEEDLE HYPO 25X1 1.5 SAFETY (NEEDLE) ×2 IMPLANT
NS IRRIG 1000ML POUR BTL (IV SOLUTION) ×2 IMPLANT
PACK SURGICAL SETUP 50X90 (CUSTOM PROCEDURE TRAY) ×2 IMPLANT
PAD ARMBOARD 7.5X6 YLW CONV (MISCELLANEOUS) ×4 IMPLANT
PENCIL BUTTON HOLSTER BLD 10FT (ELECTRODE) ×2 IMPLANT
SPECIMEN JAR SMALL (MISCELLANEOUS) ×2 IMPLANT
SPONGE LAP 18X18 X RAY DECT (DISPOSABLE) ×4 IMPLANT
STRIP CLOSURE SKIN 1/2X4 (GAUZE/BANDAGES/DRESSINGS) ×2 IMPLANT
SUT MNCRL AB 4-0 PS2 18 (SUTURE) ×2 IMPLANT
SUT VIC AB 3-0 SH 27 (SUTURE) ×1
SUT VIC AB 3-0 SH 27XBRD (SUTURE) ×1 IMPLANT
SYR BULB 3OZ (MISCELLANEOUS) ×2 IMPLANT
SYR CONTROL 10ML LL (SYRINGE) ×4 IMPLANT
TOWEL OR 17X24 6PK STRL BLUE (TOWEL DISPOSABLE) ×2 IMPLANT
TOWEL OR 17X26 10 PK STRL BLUE (TOWEL DISPOSABLE) ×2 IMPLANT
TUBE CONNECTING 12X1/4 (SUCTIONS) IMPLANT
YANKAUER SUCT BULB TIP NO VENT (SUCTIONS) IMPLANT

## 2014-12-03 NOTE — Anesthesia Procedure Notes (Signed)
Procedure Name: MAC Date/Time: 12/03/2014 7:30 AM Performed by: Carola Frost Pre-anesthesia Checklist: Patient identified and Timeout performed Oxygen Delivery Method: Nasal cannula Intubation Type: IV induction Placement Confirmation: positive ETCO2 and breath sounds checked- equal and bilateral Dental Injury: Teeth and Oropharynx as per pre-operative assessment

## 2014-12-03 NOTE — Transfer of Care (Signed)
Immediate Anesthesia Transfer of Care Note  Patient: Mandy Rhodes  Procedure(s) Performed: Procedure(s): EXCISION OF RIGHT BACK CYST  (Right)  Patient Location: PACU  Anesthesia Type:MAC  Level of Consciousness: awake, alert  and oriented  Airway & Oxygen Therapy: Patient Spontanous Breathing and Patient connected to nasal cannula oxygen  Post-op Assessment: Report given to PACU RN, Post -op Vital signs reviewed and stable and Patient moving all extremities X 4  Post vital signs: Reviewed and stable  Complications: No apparent anesthesia complications

## 2014-12-03 NOTE — Interval H&P Note (Signed)
History and Physical Interval Note:No change in H and P  12/03/2014 6:59 AM  Mandy Rhodes  has presented today for surgery, with the diagnosis of Infected Sebacious Cyst  The various methods of treatment have been discussed with the patient and family. After consideration of risks, benefits and other options for treatment, the patient has consented to  Procedure(s): EXCISION OF RIGHT BACK CYST  (Right) as a surgical intervention .  The patient's history has been reviewed, patient examined, no change in status, stable for surgery.  I have reviewed the patient's chart and labs.  Questions were answered to the patient's satisfaction.     Hania Cerone A

## 2014-12-03 NOTE — Op Note (Signed)
EXCISION OF RIGHT BACK CYST   Procedure Note  Mandy Rhodes 12/03/2014   Pre-op Diagnosis: chronically Infected Sebacious Cyst right back     Post-op Diagnosis: same  Procedure(s): EXCISION OF RIGHT BACK CYST ( 2 cm )  Surgeon(s): Coralie Keens, MD  Anesthesia: Monitor Anesthesia Care  Staff:  Circulator: Denna Haggard, RN Scrub Person: Jon Gills Kretschmaier Circulator Assistant: Jaci Standard, RN  Estimated Blood Loss: Minimal               Specimens: sent to path  Findings: The patient was found to have a chronic sebaceous cyst without evidence of acute.  Procedure: The patient was brought to the operating room and identified as the correct patient. She is placed on the operative table and anesthesia was induced. I then placed a bump over the patient shoulder. The cyst was on the very lateral right mid back. There was already some sebaceous debris visible. The area was prepped and draped in the usual sterile fashion. I anesthetized skin with 1% lidocaine and made an elliptical incision around the opening from the cyst with a scalpel. After the stented subjacent tissue with electrocautery. I was able to remove the cyst entirely with the electrocautery. I evaluated the wound and found no more capsule from the cyst. I then anesthetized further with Marcaine. I achieved hemostasis with the cautery. I irrigated the wound with saline. I then closed subjacent tissue with interrupted 2-0 Vicryl sutures and closed the skin with a running 4-0 Monocryl suture. Skin glue was then applied. The patient tolerated the procedure well. All counts were correct at the end of procedure. The patient was then taken in a stable condition from the operating room to the recovery room.          Antavion Bartoszek A   Date: 12/03/2014  Time: 8:03 AM

## 2014-12-03 NOTE — Discharge Instructions (Signed)
Ok to shower starting tomorrow  Ice pack as needed also for pain

## 2014-12-03 NOTE — Anesthesia Postprocedure Evaluation (Signed)
  Anesthesia Post-op Note  Patient: Mandy Rhodes  Procedure(s) Performed: Procedure(s): EXCISION OF RIGHT BACK CYST  (Right)  Patient Location: PACU  Anesthesia Type:MAC  Level of Consciousness: awake, alert , oriented and patient cooperative  Airway and Oxygen Therapy: Patient Spontanous Breathing  Post-op Pain: none  Post-op Assessment: Post-op Vital signs reviewed, Patient's Cardiovascular Status Stable, Respiratory Function Stable, Patent Airway, No signs of Nausea or vomiting, Adequate PO intake and Pain level controlled  Post-op Vital Signs: Reviewed and stable  Last Vitals:  Filed Vitals:   12/03/14 0850  BP: 120/63  Pulse:   Temp:   Resp: 17    Complications: No apparent anesthesia complications

## 2014-12-03 NOTE — Anesthesia Preprocedure Evaluation (Addendum)
Anesthesia Evaluation  Patient identified by MRN, date of birth, ID band Patient awake    Reviewed: Allergy & Precautions, NPO status , Patient's Chart, lab work & pertinent test results  History of Anesthesia Complications Negative for: history of anesthetic complications  Airway Mallampati: II  TM Distance: >3 FB Neck ROM: Full    Dental  (+) Edentulous Upper, Missing, Dental Advisory Given, Poor Dentition   Pulmonary former smoker,  breath sounds clear to auscultation        Cardiovascular hypertension, Pt. on medications - anginaRhythm:Regular Rate:Normal     Neuro/Psych H/o spinal stenosis and neuropathy    GI/Hepatic Neg liver ROS, GERD-  Medicated and Controlled,  Endo/Other  diabetes (glu 101), Type 2, Oral Hypoglycemic AgentsMorbid obesity  Renal/GU Renal InsufficiencyRenal disease     Musculoskeletal  (+) Arthritis -,   Abdominal (+) + obese,   Peds  Hematology   Anesthesia Other Findings   Reproductive/Obstetrics                           Anesthesia Physical Anesthesia Plan  ASA: III  Anesthesia Plan: MAC   Post-op Pain Management:    Induction: Intravenous  Airway Management Planned: Simple Face Mask and Natural Airway  Additional Equipment:   Intra-op Plan:   Post-operative Plan:   Informed Consent: I have reviewed the patients History and Physical, chart, labs and discussed the procedure including the risks, benefits and alternatives for the proposed anesthesia with the patient or authorized representative who has indicated his/her understanding and acceptance.   Dental advisory given  Plan Discussed with: CRNA and Surgeon  Anesthesia Plan Comments: (Plan routine monitors, MAC)        Anesthesia Quick Evaluation

## 2014-12-07 ENCOUNTER — Encounter (HOSPITAL_COMMUNITY): Payer: Self-pay | Admitting: Surgery

## 2014-12-09 ENCOUNTER — Telehealth: Payer: Self-pay | Admitting: Internal Medicine

## 2014-12-09 NOTE — Telephone Encounter (Signed)
, °

## 2014-12-30 ENCOUNTER — Ambulatory Visit: Payer: Medicare HMO

## 2014-12-30 ENCOUNTER — Other Ambulatory Visit: Payer: Medicare HMO

## 2015-01-12 ENCOUNTER — Other Ambulatory Visit: Payer: Self-pay | Admitting: Medical Oncology

## 2015-01-12 DIAGNOSIS — D649 Anemia, unspecified: Secondary | ICD-10-CM

## 2015-01-13 ENCOUNTER — Other Ambulatory Visit (HOSPITAL_BASED_OUTPATIENT_CLINIC_OR_DEPARTMENT_OTHER): Payer: Medicare HMO

## 2015-01-13 ENCOUNTER — Ambulatory Visit (HOSPITAL_BASED_OUTPATIENT_CLINIC_OR_DEPARTMENT_OTHER): Payer: Medicare HMO | Admitting: Internal Medicine

## 2015-01-13 ENCOUNTER — Encounter: Payer: Self-pay | Admitting: Internal Medicine

## 2015-01-13 VITALS — BP 132/59 | HR 80 | Temp 97.5°F | Resp 19 | Ht 63.0 in | Wt 233.2 lb

## 2015-01-13 DIAGNOSIS — N289 Disorder of kidney and ureter, unspecified: Secondary | ICD-10-CM

## 2015-01-13 DIAGNOSIS — D638 Anemia in other chronic diseases classified elsewhere: Secondary | ICD-10-CM | POA: Diagnosis not present

## 2015-01-13 DIAGNOSIS — D649 Anemia, unspecified: Secondary | ICD-10-CM

## 2015-01-13 DIAGNOSIS — D509 Iron deficiency anemia, unspecified: Secondary | ICD-10-CM | POA: Diagnosis not present

## 2015-01-13 DIAGNOSIS — D539 Nutritional anemia, unspecified: Secondary | ICD-10-CM

## 2015-01-13 LAB — CBC WITH DIFFERENTIAL/PLATELET
BASO%: 0.4 % (ref 0.0–2.0)
Basophils Absolute: 0 10*3/uL (ref 0.0–0.1)
EOS ABS: 0.1 10*3/uL (ref 0.0–0.5)
EOS%: 1.4 % (ref 0.0–7.0)
HEMATOCRIT: 32.2 % — AB (ref 34.8–46.6)
HGB: 10.6 g/dL — ABNORMAL LOW (ref 11.6–15.9)
LYMPH%: 30.7 % (ref 14.0–49.7)
MCH: 29.4 pg (ref 25.1–34.0)
MCHC: 32.9 g/dL (ref 31.5–36.0)
MCV: 89.4 fL (ref 79.5–101.0)
MONO#: 0.4 10*3/uL (ref 0.1–0.9)
MONO%: 7.2 % (ref 0.0–14.0)
NEUT#: 3.1 10*3/uL (ref 1.5–6.5)
NEUT%: 60.3 % (ref 38.4–76.8)
Platelets: 166 10*3/uL (ref 145–400)
RBC: 3.6 10*6/uL — AB (ref 3.70–5.45)
RDW: 15.1 % — ABNORMAL HIGH (ref 11.2–14.5)
WBC: 5.1 10*3/uL (ref 3.9–10.3)
lymph#: 1.6 10*3/uL (ref 0.9–3.3)

## 2015-01-13 LAB — COMPREHENSIVE METABOLIC PANEL (CC13)
ALT: 13 U/L (ref 0–55)
ANION GAP: 11 meq/L (ref 3–11)
AST: 17 U/L (ref 5–34)
Albumin: 3.7 g/dL (ref 3.5–5.0)
Alkaline Phosphatase: 74 U/L (ref 40–150)
BILIRUBIN TOTAL: 0.45 mg/dL (ref 0.20–1.20)
BUN: 32.8 mg/dL — ABNORMAL HIGH (ref 7.0–26.0)
CO2: 24 mEq/L (ref 22–29)
CREATININE: 2.1 mg/dL — AB (ref 0.6–1.1)
Calcium: 9.9 mg/dL (ref 8.4–10.4)
Chloride: 105 mEq/L (ref 98–109)
EGFR: 23 mL/min/{1.73_m2} — AB (ref >90)
GLUCOSE: 106 mg/dL (ref 70–140)
Potassium: 4.2 mEq/L (ref 3.5–5.1)
Sodium: 139 mEq/L (ref 136–145)
Total Protein: 7 g/dL (ref 6.4–8.3)

## 2015-01-13 NOTE — Progress Notes (Signed)
[facility] [phone]   [phone]  OFFICE PROGRESS NOTE  Vidal Schwalbe, MD [place], [place] [address]  DIAGNOSIS: Anemia of chronic disease plus/minus iron deficiency  PRIOR THERAPY: None  CURRENT THERAPY: Niferex 150 mg by mouth twice a day.  INTERVAL HISTORY: Mandy Rhodes 74 y.o. female returns to the clinic today for annual follow-up visit. She is a former patient of Dr. Ralene Ok and Dr. Juliann Mule. She is here today for evaluation. The patient is feeling fine and denied having any specific complaints. She has no significant fatigue or weakness. She denied having any dizzy spells. She is tolerating her treatment with Niferex fairly well with no significant adverse effects. She is followed by her primary care physician Dr. Harlan Stains as well as her nephrologist, Dr. Joelyn Oms. The patient denied having any significant chest pain, shortness breath, cough or hemoptysis. She denied having any fever or chills. She had repeat CBC and comprehensive metabolic panel performed recently and she is here for evaluation and discussion of her lab results.  MEDICAL HISTORY: Past Medical History  Diagnosis Date  . Anemia   . Hypertension   . History of spinal stenosis   . History of rotator cuff tear     Torn right rotator cuff.  . Neuropathy associated with endocrine disorder   . Diabetes mellitus     type 2  . Chronic kidney disease     Renal Insuffiency, see [facility] once a year  . GERD (gastroesophageal reflux disease)   . Arthritis     ALLERGIES:  is allergic to sulfa antibiotics.  MEDICATIONS:  Current Outpatient Prescriptions  Medication Sig Dispense Refill  . amLODipine (NORVASC) 5 MG tablet Take 5 mg by mouth daily.      . Calcium Carbonate-Vit D-Min (CALCIUM 1200 PO) Take 1,200 mg by mouth daily.    . captopril (CAPOTEN) 25 MG tablet Take 50 mg by mouth 2 (two) times daily.     . cholecalciferol  (VITAMIN D) 1000 UNITS tablet Take 2,000 Units by mouth daily.      . ferrous sulfate 325 (65 FE) MG EC tablet Take 325 mg by mouth 2 (two) times daily.     Marland Kitchen glipiZIDE (GLUCOTROL XL) 2.5 MG 24 hr tablet Take 2.5 mg by mouth daily.      . hydrochlorothiazide (HYDRODIURIL) 25 MG tablet Take 25 mg by mouth daily.    Marland Kitchen ibuprofen (ADVIL,MOTRIN) 200 MG tablet Take 200 mg by mouth as needed (pain).     . metFORMIN (GLUCOPHAGE-XR) 500 MG 24 hr tablet Take 1,000 mg by mouth daily.     . Multiple Vitamin (MULTIVITAMIN) tablet Take 1 tablet by mouth daily.      . nortriptyline (PAMELOR) 10 MG capsule Take 10 mg by mouth at bedtime.    Marland Kitchen omeprazole (PRILOSEC) 40 MG capsule Take 40 mg by mouth daily.      . pravastatin (PRAVACHOL) 40 MG tablet Take 40 mg by mouth daily.       No current facility-administered medications for this visit.    SURGICAL HISTORY:  Past Surgical History  Procedure Laterality Date  . Neuroplasty / transposition median nerve at carpal tunnel Bilateral     Hx  Left carpal tunnel repair  . Cholecystectomy  1969    Status post   . Abdominal hysterectomy  1971    Partial  . Back surgery  2009    Disectomy  .  Appendectomy    . Foot surgery Bilateral     bone removed  . Cyst removal trunk Right 12/03/2014    Procedure: EXCISION OF RIGHT BACK CYST ;  Surgeon: Coralie Keens, MD;  Location: [facility];  Service: General;  Laterality: Right;    REVIEW OF SYSTEMS:  A comprehensive review of systems was negative.   PHYSICAL EXAMINATION: General appearance: alert, cooperative and no distress Head: Normocephalic, without obvious abnormality, atraumatic Neck: no adenopathy, no JVD, supple, symmetrical, trachea midline and thyroid not enlarged, symmetric, no tenderness/mass/nodules Lymph nodes: Cervical, supraclavicular, and axillary nodes normal. Resp: clear to auscultation bilaterally Back: symmetric, no curvature. ROM normal. No CVA tenderness. Cardio: regular rate and rhythm, S1,  S2 normal, no murmur, click, rub or gallop GI: soft, non-tender; bowel sounds normal; no masses,  no organomegaly Extremities: extremities normal, atraumatic, no cyanosis or edema  ECOG PERFORMANCE STATUS: 1 - Symptomatic but completely ambulatory  Blood pressure 132/59, pulse 80, temperature 97.5 F (36.4 C), temperature source Oral, resp. rate 19, height 5\' 3"  (1.6 m), weight 233 lb 3.2 oz (105.779 kg), SpO2 100 %.  LABORATORY DATA: Lab Results  Component Value Date   WBC 5.1 01/13/2015   HGB 10.6* 01/13/2015   HCT 32.2* 01/13/2015   MCV 89.4 01/13/2015   PLT 166 01/13/2015      Chemistry      Component Value Date/Time   NA 139 01/13/2015 1016   NA 138 12/03/2014 0628   K 4.2 01/13/2015 1016   K 4.0 12/03/2014 0628   CL 104 12/03/2014 0628   CL 108* 12/27/2012 0849   CO2 24 01/13/2015 1016   CO2 22 12/03/2014 0628   BUN 32.8* 01/13/2015 1016   BUN 26* 12/03/2014 0628   CREATININE 2.1* 01/13/2015 1016   CREATININE 1.84* 12/03/2014 0628      Component Value Date/Time   CALCIUM 9.9 01/13/2015 1016   CALCIUM 9.8 12/03/2014 0628   ALKPHOS 74 01/13/2015 1016   ALKPHOS 112 09/26/2011 1703   AST 17 01/13/2015 1016   AST 14 09/26/2011 1703   ALT 13 01/13/2015 1016   ALT 12 09/26/2011 1703   BILITOT 0.45 01/13/2015 1016   BILITOT 0.2* 09/26/2011 1703       RADIOGRAPHIC STUDIES: No results found.  ASSESSMENT AND PLAN: This is a very pleasant 74 years old African-American female with history of anemia of chronic disease secondary to renal insufficiency as well as iron deficiency. The patient is currently on oral iron tablets and tolerating it fairly well. Her hemoglobin and hematocrit are better compared to a year ago. I recommended for the patient to continue on the oral iron tablets for now. She is currently followed by her primary care physician as well as her nephrologist. I recommended for the patient to continue her routine visit with these physicians. I don't  see a need for the patient to continue routine follow-up visit with me at this point but I'll be happy to see her in the future if needed. The patient agreed with the current plan. She was advised to call if she has any concerning symptoms. The patient voices understanding of current disease status and treatment options and is in agreement with the current care plan.  All questions were answered. The patient knows to call the clinic with any problems, questions or concerns. We can certainly see the patient much sooner if necessary.  Disclaimer: This note was dictated with voice recognition software. Similar sounding words can inadvertently be transcribed and  may not be corrected upon review.

## 2015-04-26 ENCOUNTER — Other Ambulatory Visit: Payer: Self-pay | Admitting: Family Medicine

## 2015-04-26 ENCOUNTER — Ambulatory Visit
Admission: RE | Admit: 2015-04-26 | Discharge: 2015-04-26 | Disposition: A | Discharge status: Home / Self Care | Payer: Medicare HMO | Source: Ambulatory Visit | Attending: Family Medicine | Admitting: Family Medicine

## 2015-04-26 DIAGNOSIS — M545 Low back pain: Secondary | ICD-10-CM

## 2015-07-15 ENCOUNTER — Emergency Department (HOSPITAL_COMMUNITY)
Admission: EM | Admit: 2015-07-15 | Discharge: 2015-07-15 | Disposition: A | Discharge status: Home / Self Care | Payer: Commercial Managed Care - HMO | Attending: Emergency Medicine | Admitting: Emergency Medicine

## 2015-07-15 ENCOUNTER — Emergency Department (HOSPITAL_COMMUNITY): Payer: Commercial Managed Care - HMO

## 2015-07-15 ENCOUNTER — Encounter (HOSPITAL_COMMUNITY): Payer: Self-pay | Admitting: *Deleted

## 2015-07-15 DIAGNOSIS — I129 Hypertensive chronic kidney disease with stage 1 through stage 4 chronic kidney disease, or unspecified chronic kidney disease: Secondary | ICD-10-CM | POA: Diagnosis not present

## 2015-07-15 DIAGNOSIS — K219 Gastro-esophageal reflux disease without esophagitis: Secondary | ICD-10-CM | POA: Diagnosis not present

## 2015-07-15 DIAGNOSIS — D649 Anemia, unspecified: Secondary | ICD-10-CM | POA: Insufficient documentation

## 2015-07-15 DIAGNOSIS — M1990 Unspecified osteoarthritis, unspecified site: Secondary | ICD-10-CM | POA: Diagnosis not present

## 2015-07-15 DIAGNOSIS — Y9289 Other specified places as the place of occurrence of the external cause: Secondary | ICD-10-CM | POA: Insufficient documentation

## 2015-07-15 DIAGNOSIS — S3991XA Unspecified injury of abdomen, initial encounter: Secondary | ICD-10-CM | POA: Insufficient documentation

## 2015-07-15 DIAGNOSIS — Z79899 Other long term (current) drug therapy: Secondary | ICD-10-CM | POA: Diagnosis not present

## 2015-07-15 DIAGNOSIS — S8991XA Unspecified injury of right lower leg, initial encounter: Secondary | ICD-10-CM | POA: Insufficient documentation

## 2015-07-15 DIAGNOSIS — Y9389 Activity, other specified: Secondary | ICD-10-CM | POA: Insufficient documentation

## 2015-07-15 DIAGNOSIS — N189 Chronic kidney disease, unspecified: Secondary | ICD-10-CM | POA: Insufficient documentation

## 2015-07-15 DIAGNOSIS — S8992XA Unspecified injury of left lower leg, initial encounter: Secondary | ICD-10-CM | POA: Diagnosis not present

## 2015-07-15 DIAGNOSIS — S0990XA Unspecified injury of head, initial encounter: Secondary | ICD-10-CM | POA: Diagnosis not present

## 2015-07-15 DIAGNOSIS — R350 Frequency of micturition: Secondary | ICD-10-CM | POA: Insufficient documentation

## 2015-07-15 DIAGNOSIS — W01198A Fall on same level from slipping, tripping and stumbling with subsequent striking against other object, initial encounter: Secondary | ICD-10-CM | POA: Insufficient documentation

## 2015-07-15 DIAGNOSIS — G629 Polyneuropathy, unspecified: Secondary | ICD-10-CM | POA: Insufficient documentation

## 2015-07-15 DIAGNOSIS — Y998 Other external cause status: Secondary | ICD-10-CM | POA: Insufficient documentation

## 2015-07-15 DIAGNOSIS — S199XXA Unspecified injury of neck, initial encounter: Secondary | ICD-10-CM | POA: Diagnosis not present

## 2015-07-15 DIAGNOSIS — Z87891 Personal history of nicotine dependence: Secondary | ICD-10-CM | POA: Diagnosis not present

## 2015-07-15 DIAGNOSIS — E118 Type 2 diabetes mellitus with unspecified complications: Secondary | ICD-10-CM

## 2015-07-15 DIAGNOSIS — E119 Type 2 diabetes mellitus without complications: Secondary | ICD-10-CM | POA: Diagnosis not present

## 2015-07-15 DIAGNOSIS — R109 Unspecified abdominal pain: Secondary | ICD-10-CM

## 2015-07-15 LAB — URINALYSIS, ROUTINE W REFLEX MICROSCOPIC
BILIRUBIN URINE: NEGATIVE
Glucose, UA: NEGATIVE mg/dL
KETONES UR: NEGATIVE mg/dL
Leukocytes, UA: NEGATIVE
NITRITE: NEGATIVE
Protein, ur: NEGATIVE mg/dL
Specific Gravity, Urine: 1.011 (ref 1.005–1.030)
Urobilinogen, UA: 0.2 mg/dL (ref 0.0–1.0)
pH: 7 (ref 5.0–8.0)

## 2015-07-15 LAB — CBC WITH DIFFERENTIAL/PLATELET
BASOS PCT: 0 % (ref 0–1)
Basophils Absolute: 0 10*3/uL (ref 0.0–0.1)
EOS ABS: 0.1 10*3/uL (ref 0.0–0.7)
Eosinophils Relative: 2 % (ref 0–5)
HCT: 35 % — ABNORMAL LOW (ref 36.0–46.0)
Hemoglobin: 11.6 g/dL — ABNORMAL LOW (ref 12.0–15.0)
Lymphocytes Relative: 21 % (ref 12–46)
Lymphs Abs: 0.9 10*3/uL (ref 0.7–4.0)
MCH: 29.7 pg (ref 26.0–34.0)
MCHC: 33.1 g/dL (ref 30.0–36.0)
MCV: 89.5 fL (ref 78.0–100.0)
MONO ABS: 0.3 10*3/uL (ref 0.1–1.0)
MONOS PCT: 8 % (ref 3–12)
NEUTROS PCT: 69 % (ref 43–77)
Neutro Abs: 2.9 10*3/uL (ref 1.7–7.7)
Platelets: 163 10*3/uL (ref 150–400)
RBC: 3.91 MIL/uL (ref 3.87–5.11)
RDW: 15.2 % (ref 11.5–15.5)
WBC: 4.1 10*3/uL (ref 4.0–10.5)

## 2015-07-15 LAB — BASIC METABOLIC PANEL
Anion gap: 7 (ref 5–15)
BUN: 32 mg/dL — ABNORMAL HIGH (ref 6–20)
CALCIUM: 10.4 mg/dL — AB (ref 8.9–10.3)
CO2: 26 mmol/L (ref 22–32)
CREATININE: 1.47 mg/dL — AB (ref 0.44–1.00)
Chloride: 105 mmol/L (ref 101–111)
GFR calc non Af Amer: 34 mL/min — ABNORMAL LOW (ref >60)
GFR, EST AFRICAN AMERICAN: 40 mL/min — AB (ref >60)
GLUCOSE: 131 mg/dL — AB (ref 65–99)
Potassium: 4.6 mmol/L (ref 3.5–5.1)
Sodium: 138 mmol/L (ref 135–145)

## 2015-07-15 LAB — CBG MONITORING, ED: Glucose-Capillary: 89 mg/dL (ref 65–99)

## 2015-07-15 LAB — URINE MICROSCOPIC-ADD ON

## 2015-07-15 MED ORDER — ACETAMINOPHEN-CODEINE #3 300-30 MG PO TABS: 1.0000 | ORAL_TABLET | Freq: Four times a day (QID) | ORAL | Status: DC | PRN

## 2015-07-15 NOTE — ED Notes (Signed)
Patient reports left flank pain and urinary frequency for 2 days. Patient states she was going to her primary, but today the pain was so severe that she fell to her knees and bumped the back of her head on a door, but denies loss of consciousness. The fall was pain induced.

## 2015-07-15 NOTE — ED Notes (Signed)
Bed: RW41 Expected date:  Expected time:  Means of arrival:  Comments: Fall-ems

## 2015-07-15 NOTE — Discharge Instructions (Signed)
Please take Ultracet for pain control as prescribed.  Please follow up with your Primary care provider in 2 days for follow up. Please return to the Emergency department if symptoms worsen or new onset fever, nausea, vomiting, diarrhea, blood in urine or stool.

## 2015-07-15 NOTE — ED Provider Notes (Signed)
CSN: [id number]     Arrival date & time 07/15/15  1216 History   First MD Initiated Contact with Patient 07/15/15 1226     Chief Complaint  Patient presents with  . Flank Pain  . Urinary Frequency     (Consider location/radiation/quality/duration/timing/severity/associated sxs/prior Treatment) HPI Comments: Pt is a 74 yo female with history of DM and HTN who presents to the ED via EMS with complaint of fall and left flank pain. Pt reports she has had left flank pain radiating to her left hip for the past 2 days with urinary frequency. Denies fever, chills, abdominal pain, N/V, diarrhea, constipation, blood in stool or urine, dysuria, vaginal d/c. Pt denies history of kidney stones. She notes she was making her bed this morning and suddenly fell to her knees due to pain and consequentially fell backwards and hit the back of her head on the door knob. Pt denies LOC, syncope, headache, changes in vision, neck pain, back pain, dizziness, lightheadedness, weakness, numbness or tingling. Pt reports chronic swelling of lower extremities.   Patient is a 74 y.o. female presenting with flank pain and frequency.  Flank Pain  Urinary Frequency    Past Medical History  Diagnosis Date  . Anemia   . Hypertension   . History of spinal stenosis   . History of rotator cuff tear     Torn right rotator cuff.  . Neuropathy associated with endocrine disorder   . Diabetes mellitus     type 2  . Chronic kidney disease     Renal Insuffiency, see [facility] once a year  . GERD (gastroesophageal reflux disease)   . Arthritis    Past Surgical History  Procedure Laterality Date  . Neuroplasty / transposition median nerve at carpal tunnel Bilateral     Hx  Left carpal tunnel repair  . Cholecystectomy  1969    Status post   . Abdominal hysterectomy  1971    Partial  . Back surgery  2009    Disectomy  . Appendectomy    . Foot surgery Bilateral     bone removed  . Cyst removal trunk Right  12/03/2014    Procedure: EXCISION OF RIGHT BACK CYST ;  Surgeon: Coralie Keens, MD;  Location: [facility];  Service: General;  Laterality: Right;   No family history on file. Social History  Substance Use Topics  . Smoking status: Former Research scientist (life sciences)  . Smokeless tobacco: None     Comment: 12/02/13- quit over 20 years ago  . Alcohol Use: No   OB History    No data available     Review of Systems  Genitourinary: Positive for frequency and flank pain.  All other systems reviewed and are negative.     Allergies  Sulfa antibiotics  Home Medications   Prior to Admission medications   Medication Sig Start Date End Date Taking? Authorizing Provider  amLODipine (NORVASC) 5 MG tablet Take 5 mg by mouth daily.     Yes Historical Provider, MD  Calcium Carbonate-Vit D-Min (CALCIUM 1200 PO) Take 1,200 mg by mouth daily.   Yes Historical Provider, MD  captopril (CAPOTEN) 25 MG tablet Take 50 mg by mouth 2 (two) times daily.    Yes Historical Provider, MD  cholecalciferol (VITAMIN D) 1000 UNITS tablet Take 1,000 Units by mouth daily.    Yes Historical Provider, MD  ferrous sulfate 325 (65 FE) MG EC tablet Take 650 mg by mouth daily.    Yes Historical Provider, MD  glipiZIDE (GLUCOTROL XL) 2.5 MG 24 hr tablet Take 2.5 mg by mouth 2 (two) times daily.    Yes Historical Provider, MD  hydrochlorothiazide (HYDRODIURIL) 25 MG tablet Take 25 mg by mouth daily.   Yes Historical Provider, MD  ibuprofen (ADVIL,MOTRIN) 200 MG tablet Take 200 mg by mouth as needed (pain).    Yes Historical Provider, MD  Multiple Vitamin (MULTIVITAMIN) tablet Take 1 tablet by mouth daily.     Yes Historical Provider, MD  nortriptyline (PAMELOR) 10 MG capsule Take 10 mg by mouth at bedtime.   Yes Historical Provider, MD  omeprazole (PRILOSEC) 40 MG capsule Take 40 mg by mouth daily.     Yes Historical Provider, MD  pravastatin (PRAVACHOL) 40 MG tablet Take 40 mg by mouth daily.     Yes Historical Provider, MD  PRESCRIPTION  MEDICATION 1 drop every 3 (three) months. Eye drops to be used the day before, of , and after eye injections   Yes Historical Provider, MD  traMADol (ULTRAM) 50 MG tablet Take 50 mg by mouth every 6 (six) hours as needed. 04/26/15  Yes Historical Provider, MD   BP 129/70 mmHg  Pulse 76  Temp(Src) 98.1 F (36.7 C) (Oral)  Resp 18  Ht 5\' 5"  (1.651 m)  Wt 232 lb (105.235 kg)  BMI 38.61 kg/m2  SpO2 96% Physical Exam  Constitutional: She is oriented to person, place, and time. She appears well-developed and well-nourished.  HENT:  Head: Normocephalic and atraumatic. Head is without raccoon's eyes, without Battle's sign, without abrasion, without contusion and without laceration.  Right Ear: Tympanic membrane and external ear normal. No hemotympanum.  Left Ear: Tympanic membrane and external ear normal. No hemotympanum.  Nose: Nose normal. No sinus tenderness or nasal septal hematoma.  Mouth/Throat: Uvula is midline, oropharynx is clear and moist and mucous membranes are normal.  Eyes: Conjunctivae and EOM are normal. Pupils are equal, round, and reactive to light. Right eye exhibits no discharge. Left eye exhibits no discharge. No scleral icterus.  Neck: Normal range of motion. Neck supple.  No midline tenderness to c-spine.  Cardiovascular: Normal rate, regular rhythm, normal heart sounds and intact distal pulses.   Pulmonary/Chest: Effort normal and breath sounds normal. She has no wheezes. She has no rales. She exhibits no tenderness.  Abdominal: Soft. Bowel sounds are normal. She exhibits no mass. There is tenderness. There is no rebound, no guarding and no CVA tenderness.  Left flank mildly TTP.  Musculoskeletal: Normal range of motion. She exhibits no edema.  2+ pitting edema on right lower extremity extending up to knee, 1+ pitting edema on left lower extremity extending up to mid-shin. No C/T/L spine midline tenderness.  Lymphadenopathy:    She has no cervical adenopathy.   Neurological: She is alert and oriented to person, place, and time. She has normal strength and normal reflexes. No cranial nerve deficit or sensory deficit. She displays a negative Romberg sign. Coordination normal.  Normal finger-nose-finger exam.   Skin: Skin is warm and dry. No abrasion, no ecchymosis and no laceration noted.  Nursing note and vitals reviewed.   ED Course  Procedures (including critical care time) Labs Review Labs Reviewed  URINALYSIS, ROUTINE W REFLEX MICROSCOPIC (NOT AT [facility]) - Abnormal; Notable for the following:    Hgb urine dipstick TRACE (*)    All other components within normal limits  BASIC METABOLIC PANEL - Abnormal; Notable for the following:    Glucose, Bld 131 (*)    BUN 32 (*)  Creatinine, Ser 1.47 (*)    Calcium 10.4 (*)    GFR calc non Af Amer 34 (*)    GFR calc Af Amer 40 (*)    All other components within normal limits  CBC WITH DIFFERENTIAL/PLATELET - Abnormal; Notable for the following:    Hemoglobin 11.6 (*)    HCT 35.0 (*)    All other components within normal limits  URINE MICROSCOPIC-ADD ON - Abnormal; Notable for the following:    Squamous Epithelial / LPF FEW (*)    All other components within normal limits  URINE CULTURE  CBG MONITORING, ED    Imaging Review Ct Head Wo Contrast  07/15/2015   CLINICAL DATA:  Patient status post fall 2 the knees, bumping back of head on floor. No loss of consciousness.  EXAM: CT HEAD WITHOUT CONTRAST  CT CERVICAL SPINE WITHOUT CONTRAST  TECHNIQUE: Multidetector CT imaging of the head and cervical spine was performed following the standard protocol without intravenous contrast. Multiplanar CT image reconstructions of the cervical spine were also generated.  COMPARISON:  None.  FINDINGS: CT HEAD FINDINGS  Ventricles and sulci are appropriate for patient's age. No evidence for acute cortically based infarct, intracranial hemorrhage, mass lesion or mass-effect. There is a small 1.6 x 0.4 cm extra-axial  calcified mass overlying the left frontal calvarium. The orbits are unremarkable. Paranasal sinuses unremarkable. Calvarium is intact.  CT CERVICAL SPINE FINDINGS  Patient status post anterior cervical spinal fusion with osseous fusion of cervical spine. Hardware appears intact. No evidence for acute fracture. Multilevel facet degenerative changes. Craniocervical junction is unremarkable. Prevertebral soft tissues are unremarkable. Lung apices are clear. Visualized thyroid is unremarkable.  IMPRESSION: No acute intracranial process.  No acute cervical spine fracture.  Multilevel cervical spinal fusion.  Probable small meningioma overlying the left frontal calvarium.   Electronically Signed   By: Lovey Newcomer M.D.   On: 07/15/2015 14:49   Ct Cervical Spine Wo Contrast  07/15/2015   CLINICAL DATA:  Patient status post fall 2 the knees, bumping back of head on floor. No loss of consciousness.  EXAM: CT HEAD WITHOUT CONTRAST  CT CERVICAL SPINE WITHOUT CONTRAST  TECHNIQUE: Multidetector CT imaging of the head and cervical spine was performed following the standard protocol without intravenous contrast. Multiplanar CT image reconstructions of the cervical spine were also generated.  COMPARISON:  None.  FINDINGS: CT HEAD FINDINGS  Ventricles and sulci are appropriate for patient's age. No evidence for acute cortically based infarct, intracranial hemorrhage, mass lesion or mass-effect. There is a small 1.6 x 0.4 cm extra-axial calcified mass overlying the left frontal calvarium. The orbits are unremarkable. Paranasal sinuses unremarkable. Calvarium is intact.  CT CERVICAL SPINE FINDINGS  Patient status post anterior cervical spinal fusion with osseous fusion of cervical spine. Hardware appears intact. No evidence for acute fracture. Multilevel facet degenerative changes. Craniocervical junction is unremarkable. Prevertebral soft tissues are unremarkable. Lung apices are clear. Visualized thyroid is unremarkable.   IMPRESSION: No acute intracranial process.  No acute cervical spine fracture.  Multilevel cervical spinal fusion.  Probable small meningioma overlying the left frontal calvarium.   Electronically Signed   By: Lovey Newcomer M.D.   On: 07/15/2015 14:49   Ct Renal Stone Study  07/15/2015   CLINICAL DATA:  Left flank pain, urinary frequency  EXAM: CT ABDOMEN AND PELVIS WITHOUT CONTRAST  TECHNIQUE: Multidetector CT imaging of the abdomen and pelvis was performed following the standard protocol without IV contrast.  COMPARISON:  None.  FINDINGS: Lower chest:  Lung bases are clear.  Hepatobiliary: Unenhanced liver is unremarkable.  Status post cholecystectomy. No intrahepatic or extrahepatic ductal dilatation.  Pancreas: Within normal limits.  Spleen: Within normal limits.  Adrenals/Urinary Tract: Adrenal glands are unremarkable.  2.6 cm lateral interpolar right renal cyst (series 2/ image 29).  No renal, ureteral, or bladder calculi.  No hydronephrosis.  Bladder is within normal limits.  Stomach/Bowel: Stomach is within normal limits.  Prior appendectomy.  No evidence of bowel obstruction.  Vascular/Lymphatic: Atherosclerotic calcifications of the abdominal aorta and branch vessels.  No suspicious abdominopelvic lymphadenopathy.  Reproductive: Status post hysterectomy.  No adnexal masses.  Other: No abdominopelvic ascites.  Musculoskeletal: Degenerative changes of the visualized thoracolumbar spine.  IMPRESSION: No renal, ureteral, or bladder calculi.  No hydronephrosis.  No evidence of bowel obstruction.  Prior cholecystectomy, appendectomy, and hysterectomy.  No CT findings to account for the patient's left flank pain.   Electronically Signed   By: Julian Hy M.D.   On: 07/15/2015 15:39   I have personally reviewed and evaluated these images and lab results as part of my medical decision-making.  Filed Vitals:   07/15/15 1555  BP: 129/70  Pulse: 76  Temp:   Resp: 18   Meds given in ED:  Medications  - No data to display  New Prescriptions   ACETAMINOPHEN-CODEINE (TYLENOL #3) 300-30 MG PER TABLET    Take 1 tablet by mouth every 6 (six) hours as needed for moderate pain.      MDM   Final diagnoses:  Left sided abdominal pain  Type 2 diabetes mellitus with complication  Left flank pain    Pt presents with left flank pain radiating to left hip and urinary frequency x2 days. Fall occurred PTA due to flank pain resulting in pt hitting back of her head, denies LOC.  CT head ordered to rule out cranial hemorrhage due to fall and reported head injury. Pt started complaining of neck pain while in CT. CT cervical spine ordered.   CBC, BMP unremarkable. UA showed trace hgb. Discussed results with pt, agreed on plan to order CT Renal Stone study to evaluate pt for kidney stone. CT head and neck revealed no acute process or fx and probably small meningioma overlying left frontal calvarium . CT renal stone study showed no calculi, no hydronephrosis, no bowel obstruction and no CT findings to account for the pt's left flank pain.  Evaluation does not show pathology requring ongoing emergent intervention or admission. I do not suspect kidney stones, UTI, pyelonephritis at this time based off pt presentation and results. Pt is hemodynamically stable and mentating appropriately. Discussed findings/results and plan with patient/guardian, who agrees with plan. Pt advised to follow up with her PCP in 2 days and pt give rx for tylenol-codeine and advised to take as prescribed. All questions answered. Return precautions discussed.      Chesley Noon [place], [place] 07/15/15 1633  Varney Biles, MD 07/23/15 [phone]

## 2015-07-15 NOTE — ED Notes (Signed)
Attending at bedside.

## 2015-07-17 LAB — URINE CULTURE: Special Requests: NORMAL

## 2016-03-17 ENCOUNTER — Emergency Department (HOSPITAL_COMMUNITY): Payer: Medicare Other

## 2016-03-17 ENCOUNTER — Encounter (HOSPITAL_COMMUNITY): Payer: Self-pay | Admitting: Emergency Medicine

## 2016-03-17 ENCOUNTER — Observation Stay (HOSPITAL_COMMUNITY)
Admission: EM | Admit: 2016-03-17 | Discharge: 2016-03-23 | Disposition: A | Discharge status: Home / Self Care | Payer: Medicare Other | Attending: Internal Medicine | Admitting: Internal Medicine

## 2016-03-17 DIAGNOSIS — N183 Chronic kidney disease, stage 3 unspecified: Secondary | ICD-10-CM | POA: Insufficient documentation

## 2016-03-17 DIAGNOSIS — Z87891 Personal history of nicotine dependence: Secondary | ICD-10-CM | POA: Diagnosis not present

## 2016-03-17 DIAGNOSIS — D509 Iron deficiency anemia, unspecified: Secondary | ICD-10-CM | POA: Diagnosis not present

## 2016-03-17 DIAGNOSIS — R059 Cough, unspecified: Secondary | ICD-10-CM | POA: Insufficient documentation

## 2016-03-17 DIAGNOSIS — Z9049 Acquired absence of other specified parts of digestive tract: Secondary | ICD-10-CM | POA: Diagnosis not present

## 2016-03-17 DIAGNOSIS — I5021 Acute systolic (congestive) heart failure: Secondary | ICD-10-CM | POA: Diagnosis present

## 2016-03-17 DIAGNOSIS — R778 Other specified abnormalities of plasma proteins: Secondary | ICD-10-CM | POA: Diagnosis present

## 2016-03-17 DIAGNOSIS — E785 Hyperlipidemia, unspecified: Secondary | ICD-10-CM | POA: Insufficient documentation

## 2016-03-17 DIAGNOSIS — D638 Anemia in other chronic diseases classified elsewhere: Secondary | ICD-10-CM | POA: Insufficient documentation

## 2016-03-17 DIAGNOSIS — E1122 Type 2 diabetes mellitus with diabetic chronic kidney disease: Secondary | ICD-10-CM | POA: Diagnosis present

## 2016-03-17 DIAGNOSIS — R9439 Abnormal result of other cardiovascular function study: Secondary | ICD-10-CM | POA: Diagnosis not present

## 2016-03-17 DIAGNOSIS — R05 Cough: Secondary | ICD-10-CM | POA: Insufficient documentation

## 2016-03-17 DIAGNOSIS — I130 Hypertensive heart and chronic kidney disease with heart failure and stage 1 through stage 4 chronic kidney disease, or unspecified chronic kidney disease: Principal | ICD-10-CM | POA: Insufficient documentation

## 2016-03-17 DIAGNOSIS — R7989 Other specified abnormal findings of blood chemistry: Secondary | ICD-10-CM | POA: Diagnosis present

## 2016-03-17 DIAGNOSIS — Z79899 Other long term (current) drug therapy: Secondary | ICD-10-CM | POA: Insufficient documentation

## 2016-03-17 DIAGNOSIS — K219 Gastro-esophageal reflux disease without esophagitis: Secondary | ICD-10-CM | POA: Insufficient documentation

## 2016-03-17 DIAGNOSIS — N189 Chronic kidney disease, unspecified: Secondary | ICD-10-CM | POA: Insufficient documentation

## 2016-03-17 DIAGNOSIS — I517 Cardiomegaly: Secondary | ICD-10-CM

## 2016-03-17 DIAGNOSIS — E119 Type 2 diabetes mellitus without complications: Secondary | ICD-10-CM

## 2016-03-17 DIAGNOSIS — N184 Chronic kidney disease, stage 4 (severe): Secondary | ICD-10-CM | POA: Diagnosis present

## 2016-03-17 DIAGNOSIS — I2510 Atherosclerotic heart disease of native coronary artery without angina pectoris: Secondary | ICD-10-CM | POA: Insufficient documentation

## 2016-03-17 DIAGNOSIS — N182 Chronic kidney disease, stage 2 (mild): Secondary | ICD-10-CM

## 2016-03-17 DIAGNOSIS — I10 Essential (primary) hypertension: Secondary | ICD-10-CM | POA: Diagnosis present

## 2016-03-17 DIAGNOSIS — I509 Heart failure, unspecified: Secondary | ICD-10-CM

## 2016-03-17 LAB — COMPREHENSIVE METABOLIC PANEL
ALBUMIN: 4 g/dL (ref 3.5–5.0)
ALK PHOS: 76 U/L (ref 38–126)
ALT: 20 U/L (ref 14–54)
AST: 21 U/L (ref 15–41)
Anion gap: 10 (ref 5–15)
BUN: 27 mg/dL — AB (ref 6–20)
CALCIUM: 10.2 mg/dL (ref 8.9–10.3)
CO2: 25 mmol/L (ref 22–32)
Chloride: 104 mmol/L (ref 101–111)
Creatinine, Ser: 1.68 mg/dL — ABNORMAL HIGH (ref 0.44–1.00)
GFR calc Af Amer: 33 mL/min — ABNORMAL LOW (ref >60)
GFR calc non Af Amer: 29 mL/min — ABNORMAL LOW (ref >60)
GLUCOSE: 121 mg/dL — AB (ref 65–99)
POTASSIUM: 4 mmol/L (ref 3.5–5.1)
Sodium: 139 mmol/L (ref 135–145)
TOTAL PROTEIN: 7.2 g/dL (ref 6.5–8.1)
Total Bilirubin: 0.6 mg/dL (ref 0.3–1.2)

## 2016-03-17 LAB — CBC WITH DIFFERENTIAL/PLATELET
BASOS ABS: 0 10*3/uL (ref 0.0–0.1)
BASOS PCT: 0 %
Eosinophils Absolute: 0.1 10*3/uL (ref 0.0–0.7)
Eosinophils Relative: 2 %
HCT: 32.7 % — ABNORMAL LOW (ref 36.0–46.0)
HEMOGLOBIN: 11 g/dL — AB (ref 12.0–15.0)
Lymphocytes Relative: 34 %
Lymphs Abs: 1.5 10*3/uL (ref 0.7–4.0)
MCH: 29 pg (ref 26.0–34.0)
MCHC: 33.6 g/dL (ref 30.0–36.0)
MCV: 86.3 fL (ref 78.0–100.0)
Monocytes Absolute: 0.4 10*3/uL (ref 0.1–1.0)
Monocytes Relative: 8 %
NEUTROS ABS: 2.4 10*3/uL (ref 1.7–7.7)
NEUTROS PCT: 56 %
Platelets: 170 10*3/uL (ref 150–400)
RBC: 3.79 MIL/uL — ABNORMAL LOW (ref 3.87–5.11)
RDW: 14.8 % (ref 11.5–15.5)
WBC: 4.4 10*3/uL (ref 4.0–10.5)

## 2016-03-17 LAB — TROPONIN I: TROPONIN I: 0.18 ng/mL — AB (ref <0.031)

## 2016-03-17 LAB — I-STAT TROPONIN, ED: Troponin i, poc: 0.19 ng/mL (ref 0.00–0.08)

## 2016-03-17 LAB — BRAIN NATRIURETIC PEPTIDE: B Natriuretic Peptide: 377.8 pg/mL — ABNORMAL HIGH (ref 0.0–100.0)

## 2016-03-17 MED ORDER — FUROSEMIDE 10 MG/ML IJ SOLN
20.0000 mg | Freq: Once | INTRAMUSCULAR | Status: AC
  Administered 2016-03-17: 20 mg via INTRAVENOUS
  Filled 2016-03-17: qty 4

## 2016-03-17 NOTE — ED Notes (Signed)
Ralene Bathe, MD given troponin result.

## 2016-03-17 NOTE — H&P (Signed)
Mandy Rhodes [id number] DOB: 02-Aug-1941 DOA: 03/17/2016   Referring MD Ayesha Rumpf PCP: Vidal Schwalbe, MD   Outpatient Specialists: Oncology [facility] Patient coming from: home  Chief Complaint: Coughing and cold symptoms    HPI: Mandy Rhodes is a 75 y.o. female with medical history significant of diabetes mellitus associated chronic kidney disease, iron deficiency anemia as well as anemia of chronic disease and History of hypertension    Presented with Coughing and cold symptoms for the past 5 days this was associated with chest congestion and dry cough which is worse at night when she is trying to lay down flat. She have had nasal congestion, post nasal drip no eye iritation. She have had some leg swelling for a long time but it has been mild.  This was also associated with rib/back pain which worse when she tries to cough and feels like a  muscle spasms. This was not associated with fevers or chills no lightheadedness no abdominal pain and nausea no vomiting no diarrhea no rhinorrhea no shortness of breath was so throat no wheezing. Patient denies any shortness of breath or chest pain with ambulation but she only walks around the house secondary to spinal stenosis   Regarding pertinent Chronic problems: She has history of chronic iron deficiency anemia been seen by oncology in the past for this and currently receiving iron supliments He has history of diabetes associated diabetic neuropathy as well as chronic kidney disease stage II followed at  kidney center   She has history of spinal canal stenosis with chronic back pain requiring walker when she ambulates  IN ER: WBC 4.4 hemoglobin 11.0 creatinine 1.67 troponin elevated 0.18 same on repeat. BUN elevated 377  Chest x-ray showing cardiomegaly but no evidence of acute fluid overload    Hospitalist was called for admission for elevated troponin in the setting of cardiomegaly and recent URI type of symptoms  Review of Systems:      Pertinent positives include:  Back pain (catch) non-productive cough, sneezing,  nasal congestion, post nasal drip, fatigue, Bilateral lower extremity swelling, decreased appetite  Constitutional:  No weight loss, night sweats, Fevers, chills, weight loss  HEENT:  No headaches, Difficulty swallowing,Tooth/dental problems,Sore throat,  No itching, ear ache,  Cardio-vascular:  No Orthopnea, PND, anasarca, dizziness, palpitations.no   GI:  No heartburn, indigestion, abdominal pain, nausea, vomiting, diarrhea, change in bowel habits, loss of appetite, melena, blood in stool, hematemesis Resp:  no shortness of breath at rest. NoNo excess mucus, no productive cough, No, No coughing up of blood.No change in color of mucus.No wheezing. Skin:  no rash or lesions. No jaundice GU:  no dysuria, change in color of urine, no urgency or frequency. No straining to urinate.  No flank pain.  Musculoskeletal:  No joint pain or no joint swelling. No decreased range of motion. No back pain.  Psych:  No change in mood or affect. No depression or anxiety. No memory loss.  Neuro: no localizing neurological complaints, no tingling, no weakness, no double vision, no gait abnormality, no slurred speech, no confusion  As per HPI otherwise 10 point review of systems negative.   Past Medical History: Past Medical History  Diagnosis Date  . Anemia   . Hypertension   . History of spinal stenosis   . History of rotator cuff tear     Torn right rotator cuff.  . Neuropathy associated with endocrine disorder ([place])   . Diabetes mellitus     type 2  .  Chronic kidney disease     Renal Insuffiency, see [facility] once a year  . GERD (gastroesophageal reflux disease)   . Arthritis    Past Surgical History  Procedure Laterality Date  . Neuroplasty / transposition median nerve at carpal tunnel Bilateral     Hx  Left carpal tunnel repair  . Cholecystectomy  1969    Status post   . Abdominal  hysterectomy  1971    Partial  . Back surgery  2009    Disectomy  . Appendectomy    . Foot surgery Bilateral     bone removed  . Cyst removal trunk Right 12/03/2014    Procedure: EXCISION OF RIGHT BACK CYST ;  Surgeon: Coralie Keens, MD;  Location: [facility];  Service: General;  Laterality: Right;     Social History:  Ambulatory walker  Lives at home alone,        reports that she has quit smoking. She does not have any smokeless tobacco history on file. She reports that she does not drink alcohol or use illicit drugs.  Allergies:   Allergies  Allergen Reactions  . Sulfa Antibiotics Swelling       Family History:    Family History  Problem Relation Age of Onset  . Diabetes type II Mother   . Hypertension Father   . Diabetes type II Sister   . Ovarian cancer Sister   . Diabetes type II Other   . Stroke Neg Hx   . CAD Neg Hx   . Heart failure Neg Hx     Medications: Prior to Admission medications   Medication Sig Start Date End Date Taking? Authorizing Provider  amLODipine (NORVASC) 5 MG tablet Take 5 mg by mouth daily.     Yes Historical Provider, MD  Calcium Carbonate-Vit D-Min (CALCIUM 1200 PO) Take 1,200 mg by mouth daily.   Yes Historical Provider, MD  captopril (CAPOTEN) 25 MG tablet Take 50 mg by mouth 2 (two) times daily.    Yes Historical Provider, MD  cholecalciferol (VITAMIN D) 1000 UNITS tablet Take 1,000 Units by mouth daily.    Yes Historical Provider, MD  ferrous sulfate 325 (65 FE) MG EC tablet Take 650 mg by mouth daily.    Yes Historical Provider, MD  glimepiride (AMARYL) 1 MG tablet Take 1 mg by mouth 2 (two) times daily.  02/15/16  Yes Historical Provider, MD  hydrochlorothiazide (HYDRODIURIL) 25 MG tablet Take 25 mg by mouth daily.   Yes Historical Provider, MD  Multiple Vitamin (MULTIVITAMIN) tablet Take 1 tablet by mouth daily.     Yes Historical Provider, MD  nortriptyline (PAMELOR) 10 MG capsule Take 10 mg by mouth at bedtime.   Yes Historical  Provider, MD  omeprazole (PRILOSEC) 40 MG capsule Take 40 mg by mouth daily.     Yes Historical Provider, MD  pravastatin (PRAVACHOL) 40 MG tablet Take 40 mg by mouth daily.     Yes Historical Provider, MD  traMADol (ULTRAM) 50 MG tablet Take 50 mg by mouth every 6 (six) hours as needed. 04/26/15  Yes Historical Provider, MD  acetaminophen-codeine (TYLENOL #3) 300-30 MG per tablet Take 1 tablet by mouth every 6 (six) hours as needed for moderate pain. Patient not taking: Reported on 03/17/2016 07/15/15   Mandy Dell, PA-C  ibuprofen (ADVIL,MOTRIN) 200 MG tablet Take 200 mg by mouth as needed (pain).     Historical Provider, MD  PRESCRIPTION MEDICATION 1 drop every 3 (three) months. Eye drops to  be used the day before, of , and after eye injections    Historical Provider, MD    Physical Exam: Patient Vitals for the past 24 hrs:  BP Temp Temp src Pulse Resp SpO2  03/17/16 1803 126/77 mmHg - - 68 20 98 %  03/17/16 1542 121/60 mmHg 98.1 F (36.7 C) Oral 79 18 95 %    1. General:  in No Acute distress 2. Psychological: Alert and   Oriented 3. Head/ENT:   Moist   Mucous Membranes                          Head Non traumatic, neck supple                          Normal  Dentition 4. SKIN: normal Skin turgor,  Skin clean Dry and intact no rash 5. Heart: Regular rate and rhythm no  Murmur, Rub or gallop 6. Lungs:  no wheezes or crackles   7. Abdomen: Soft, non-tender, Non distended 8. Lower extremities: no clubbing, cyanosis, or edema 9. Neurologically Grossly intact, moving all 4 extremities equally 10. MSK: Normal range of motion   body mass index is unknown because there is no weight on file.  Labs on Admission:   Labs on Admission: I have personally reviewed following labs and imaging studies  CBC:  Recent Labs Lab 03/17/16 1818  WBC 4.4  NEUTROABS 2.4  HGB 11.0*  HCT 32.7*  MCV 86.3  PLT 123XX123   Basic Metabolic Panel:  Recent Labs Lab 03/17/16 1818  NA 139  K  4.0  CL 104  CO2 25  GLUCOSE 121*  BUN 27*  CREATININE 1.68*  CALCIUM 10.2   GFR: CrCl cannot be calculated (Unknown ideal weight.). Liver Function Tests:  Recent Labs Lab 03/17/16 1818  AST 21  ALT 20  ALKPHOS 76  BILITOT 0.6  PROT 7.2  ALBUMIN 4.0   No results for input(s): LIPASE, AMYLASE in the last 168 hours. No results for input(s): AMMONIA in the last 168 hours. Coagulation Profile: No results for input(s): INR, PROTIME in the last 168 hours. Cardiac Enzymes:  Recent Labs Lab 03/17/16 1818  TROPONINI 0.18*   BNP (last 3 results) No results for input(s): PROBNP in the last 8760 hours. HbA1C: No results for input(s): HGBA1C in the last 72 hours. CBG: No results for input(s): GLUCAP in the last 168 hours. Lipid Profile: No results for input(s): CHOL, HDL, LDLCALC, TRIG, CHOLHDL, LDLDIRECT in the last 72 hours. Thyroid Function Tests: No results for input(s): TSH, T4TOTAL, FREET4, T3FREE, THYROIDAB in the last 72 hours. Anemia Panel: No results for input(s): VITAMINB12, FOLATE, FERRITIN, TIBC, IRON, RETICCTPCT in the last 72 hours. Urine analysis:    Component Value Date/Time   COLORURINE YELLOW 07/15/2015 [place] 07/15/2015 1349   LABSPEC 1.011 07/15/2015 1349   LABSPEC 1.020 12/22/2010 1142   PHURINE 7.0 07/15/2015 1349   PHURINE 5.0 12/22/2010 1142   GLUCOSEU NEGATIVE 07/15/2015 1349   HGBUR TRACE* 07/15/2015 1349   HGBUR Negative 12/22/2010 1142   BILIRUBINUR NEGATIVE 07/15/2015 1349   BILIRUBINUR Negative 12/22/2010 1142   KETONESUR NEGATIVE 07/15/2015 1349   KETONESUR Negative 12/22/2010 [place] 07/15/2015 1349   PROTEINUR Negative 12/22/2010 1142   UROBILINOGEN 0.2 07/15/2015 1349   NITRITE NEGATIVE 07/15/2015 1349   NITRITE Negative 12/22/2010 [place] 07/15/2015 1349  LEUKOCYTESUR Negative 12/22/2010 1142   Sepsis Labs: @LABRCNTIP (procalcitonin:4,lacticidven:4) )No results found  for this or any previous visit (from the past 240 hour(s)).    UA ordered  Lab Results  Component Value Date   HGBA1C 6.2* 06/15/2011    CrCl cannot be calculated (Unknown ideal weight.).  BNP (last 3 results) No results for input(s): PROBNP in the last 8760 hours.   ECG REPORT  Independently reviewed Rate: 69  Rhythm: Sinus rhythm with borderline conduction delay ST&T Change: T wave flattening multiple leads QTC 454  There were no vitals filed for this visit.   Cultures:    Component Value Date/Time   SDES URINE, CLEAN CATCH 07/15/2015 1349   SPECREQUEST Normal 07/15/2015 1349   CULT  07/15/2015 1349    MULTIPLE SPECIES PRESENT, SUGGEST RECOLLECTION Performed at [facility] 07/17/2015 FINAL 07/15/2015 1349     Radiological Exams on Admission: Dg Chest 2 View  03/17/2016  CLINICAL DATA:  Productive cough. EXAM: CHEST  2 VIEW COMPARISON:  No recent. FINDINGS: Cardiomegaly with normal pulmonary vascularity. No focal infiltrate. No pleural effusion or pneumothorax. No acute bony abnormality . IMPRESSION: Cardiomegaly. No overt congestive heart failure. Exam otherwise unremarkable. Electronically Signed   By: Marcello Moores  Register   On: 03/17/2016 16:35    Chart has been reviewed   Assessment/Plan   75 y.o. female with medical history significant of diabetes mellitus associated chronic kidney disease, iron deficiency anemia as well as anemia of chronic disease and History of hypertension here with symptoms worrisome for URI versus bronchitis  was found to have incidentally elevated troponin with newly found cardiomegaly mild lower extremity edema no symptoms of Chest pain or SOB  Present on Admission:  . Cardiomegaly - obtain echogram, , check TSH  . CKD (chronic kidney disease) stage 2, GFR 60-89 ml/min - currently at baseline hold ACE inhibitor for now to see that contributing to cough  . Elevated troponin - ACS much less likely patient is chest  pain-free no evidence of shortness of breath no EKG changes suggestive of acute ischemia. Likely secondary to undiagnosed heart failure and renal insufficiency continued cycle obtain echogram and repeat EKG  . Essential hypertension continue Norvasc cold ACE inhibitor hold hydrochlorothiazide and initiate Lasix  . DM type 2 causing CKD stage 2 (HCC) sliding scale hold by mouth medication     Other plan as per orders.  DVT prophylaxis:   Lovenox     Code Status:   DNR/DNI  as per patient   Family Communication:   Family not at  Bedside   Disposition Plan:    To home once workup is complete and patient is stable   Consults called: emailed cardiology  Admission status:   obs    Level of care     tele       I have spent a total of 65 min on this admission   Mandy Rhodes 03/17/2016, 10:53 PM    [facility]  Pager [phone]   after 2 AM please page floor coverage PA If 7AM-7PM, please contact the day team taking care of the patient  [email or web address]  Password [email or web address]

## 2016-03-17 NOTE — ED Notes (Signed)
Lala Lund, pt's POA, can be reached at  [phone].

## 2016-03-17 NOTE — ED Notes (Signed)
Per pt, states cold symptoms, nonproductive cough since Sunday

## 2016-03-17 NOTE — ED Notes (Signed)
MD at bedside. 

## 2016-03-17 NOTE — ED Provider Notes (Signed)
CSN: [id number]     Arrival date & time 03/17/16  1531 History   First MD Initiated Contact with Patient 03/17/16 1609     Chief Complaint  Patient presents with  . Cough    HPI Comments: 75 year old female with PMH of HTN, DM, CKD, and anemia of chronic disease secondary to renal insufficiency as well as iron deficiency presents with congestion and a dry cough worse at night when lying down for the past 5 days. She reports associated bilateral lower rib pain which feels like muscle spasms/squeezing. Denies fever, chills, headache, syncope, dizziness, chest pain, abdominal pain, nausea, vomiting, diarrhea, dysuria. She is on an ACE-I.  Patient is a 75 y.o. female presenting with cough.  Cough Associated symptoms: no chest pain, no chills, no ear pain, no fever, no rhinorrhea, no shortness of breath, no sore throat and no wheezing     Past Medical History  Diagnosis Date  . Anemia   . Hypertension   . History of spinal stenosis   . History of rotator cuff tear     Torn right rotator cuff.  . Neuropathy associated with endocrine disorder ([place])   . Diabetes mellitus     type 2  . Chronic kidney disease     Renal Insuffiency, see [facility] once a year  . GERD (gastroesophageal reflux disease)   . Arthritis    Past Surgical History  Procedure Laterality Date  . Neuroplasty / transposition median nerve at carpal tunnel Bilateral     Hx  Left carpal tunnel repair  . Cholecystectomy  1969    Status post   . Abdominal hysterectomy  1971    Partial  . Back surgery  2009    Disectomy  . Appendectomy    . Foot surgery Bilateral     bone removed  . Cyst removal trunk Right 12/03/2014    Procedure: EXCISION OF RIGHT BACK CYST ;  Surgeon: Coralie Keens, MD;  Location: [facility];  Service: General;  Laterality: Right;   No family history on file. Social History  Substance Use Topics  . Smoking status: Former Research scientist (life sciences)  . Smokeless tobacco: None     Comment: 12/02/13- quit over 20  years ago  . Alcohol Use: No   OB History    No data available     Review of Systems  Constitutional: Negative for fever and chills.  HENT: Positive for congestion. Negative for ear pain, rhinorrhea, sinus pressure and sore throat.   Respiratory: Positive for cough. Negative for shortness of breath and wheezing.   Cardiovascular: Negative for chest pain, palpitations and leg swelling.  Gastrointestinal: Negative for nausea, vomiting, abdominal pain, diarrhea and constipation.  Genitourinary: Negative for dysuria.  All other systems reviewed and are negative.   Allergies  Sulfa antibiotics  Home Medications   Prior to Admission medications   Medication Sig Start Date End Date Taking? Authorizing Provider  amLODipine (NORVASC) 5 MG tablet Take 5 mg by mouth daily.     Yes Historical Provider, MD  Calcium Carbonate-Vit D-Min (CALCIUM 1200 PO) Take 1,200 mg by mouth daily.   Yes Historical Provider, MD  captopril (CAPOTEN) 25 MG tablet Take 50 mg by mouth 2 (two) times daily.    Yes Historical Provider, MD  cholecalciferol (VITAMIN D) 1000 UNITS tablet Take 1,000 Units by mouth daily.    Yes Historical Provider, MD  ferrous sulfate 325 (65 FE) MG EC tablet Take 650 mg by mouth daily.  Yes Historical Provider, MD  glimepiride (AMARYL) 1 MG tablet Take 1 mg by mouth 2 (two) times daily.  02/15/16  Yes Historical Provider, MD  hydrochlorothiazide (HYDRODIURIL) 25 MG tablet Take 25 mg by mouth daily.   Yes Historical Provider, MD  Multiple Vitamin (MULTIVITAMIN) tablet Take 1 tablet by mouth daily.     Yes Historical Provider, MD  nortriptyline (PAMELOR) 10 MG capsule Take 10 mg by mouth at bedtime.   Yes Historical Provider, MD  omeprazole (PRILOSEC) 40 MG capsule Take 40 mg by mouth daily.     Yes Historical Provider, MD  pravastatin (PRAVACHOL) 40 MG tablet Take 40 mg by mouth daily.     Yes Historical Provider, MD  traMADol (ULTRAM) 50 MG tablet Take 50 mg by mouth every 6 (six) hours  as needed. 04/26/15  Yes Historical Provider, MD  acetaminophen-codeine (TYLENOL #3) 300-30 MG per tablet Take 1 tablet by mouth every 6 (six) hours as needed for moderate pain. Patient not taking: Reported on 03/17/2016 07/15/15   Nona Dell, PA-C  ibuprofen (ADVIL,MOTRIN) 200 MG tablet Take 200 mg by mouth as needed (pain).     Historical Provider, MD  PRESCRIPTION MEDICATION 1 drop every 3 (three) months. Eye drops to be used the day before, of , and after eye injections    Historical Provider, MD   BP 121/60 mmHg  Pulse 79  Temp(Src) 98.1 F (36.7 C) (Oral)  Resp 18  SpO2 95%   Physical Exam  Constitutional: She is oriented to person, place, and time. She appears well-developed and well-nourished. No distress.  HENT:  Head: Normocephalic and atraumatic.  Eyes: Conjunctivae are normal. Pupils are equal, round, and reactive to light. Right eye exhibits no discharge. Left eye exhibits no discharge. No scleral icterus.  Neck: Normal range of motion.  Cardiovascular: Normal rate and regular rhythm.  Exam reveals no gallop and no friction rub.   No murmur heard. Pulmonary/Chest: Effort normal and breath sounds normal. No respiratory distress. She has no wheezes. She has no rales. She exhibits no tenderness.  Abdominal: Soft. There is no tenderness.  Musculoskeletal: She exhibits no edema.  Neurological: She is alert and oriented to person, place, and time.  Skin: Skin is warm and dry.  Psychiatric: She has a normal mood and affect.   ED Course  Procedures (including critical care time) Labs Review Labs Reviewed  CBC WITH DIFFERENTIAL/PLATELET - Abnormal; Notable for the following:    RBC 3.79 (*)    Hemoglobin 11.0 (*)    HCT 32.7 (*)    All other components within normal limits  COMPREHENSIVE METABOLIC PANEL - Abnormal; Notable for the following:    Glucose, Bld 121 (*)    BUN 27 (*)    Creatinine, Ser 1.68 (*)    GFR calc non Af Amer 29 (*)    GFR calc Af Amer 33  (*)    All other components within normal limits  BRAIN NATRIURETIC PEPTIDE - Abnormal; Notable for the following:    B Natriuretic Peptide 377.8 (*)    All other components within normal limits  TROPONIN I - Abnormal; Notable for the following:    Troponin I 0.18 (*)    All other components within normal limits  URINALYSIS, ROUTINE W REFLEX MICROSCOPIC (NOT AT [facility]) - Abnormal; Notable for the following:    APPearance CLOUDY (*)    All other components within normal limits  I-STAT TROPOININ, ED - Abnormal; Notable for the following:  Troponin i, poc 0.19 (*)    All other components within normal limits  GLUCOSE, CAPILLARY  TROPONIN I  TROPONIN I  TROPONIN I  BASIC METABOLIC PANEL  TSH  BRAIN NATRIURETIC PEPTIDE  MAGNESIUM  HEMOGLOBIN A1C  LIPID PANEL  VITAMIN B12  FOLATE  IRON AND TIBC  FERRITIN  RETICULOCYTES    Imaging Review Dg Chest 2 View  03/17/2016  CLINICAL DATA:  Productive cough. EXAM: CHEST  2 VIEW COMPARISON:  No recent. FINDINGS: Cardiomegaly with normal pulmonary vascularity. No focal infiltrate. No pleural effusion or pneumothorax. No acute bony abnormality . IMPRESSION: Cardiomegaly. No overt congestive heart failure. Exam otherwise unremarkable. Electronically Signed   By: Marcello Moores  Register   On: 03/17/2016 16:35   I have personally reviewed and evaluated these images and lab results as part of my medical decision-making.   EKG Interpretation   Date/Time:  Friday March 17 2016 17:51:33 EDT Ventricular Rate:  69 PR Interval:  185 QRS Duration: 112 QT Interval:  424 QTC Calculation: 454 R Axis:   91 Text Interpretation:  Sinus rhythm Borderline intraventricular conduction  delay Borderline T abnormalities, lateral leads Baseline wander in lead(s)  II III aVF Confirmed by Hazle Coca [id number]) on 03/17/2016 6:01:26 PM      MDM   Final diagnoses:  Cardiomegaly  CKD (chronic kidney disease) stage 2, GFR 60-89 ml/min  Elevated troponin  Cough    75 year old female presents with dry cough and congestion for the past 5 days. Her troponin was elevated at .19 followed by .18. She does have a history of CKD. Chest x-ray shows cardiomegaly with no overt CHF however BNP is elevated at 377.8. Patient does not feel like she has been short of breath or had increased lower leg edema. EKG shows normal sinus rhythm borderline T-wave abnormalities.  CBC shows anemia which is a chronic problem for her. CMP is remarkable for serum creatinine of 1.68 which is around her baseline. She had a visit with Dr. Ralene Bathe who consult hospitalist for admission. Patient is in no acute distress with stable vital signs.      Recardo Evangelist, PA-C 03/18/16 0116  Recardo Evangelist, PA-C 03/18/16 0118  Quintella Reichert, MD 03/19/16 [facility]

## 2016-03-18 ENCOUNTER — Observation Stay (HOSPITAL_BASED_OUTPATIENT_CLINIC_OR_DEPARTMENT_OTHER): Payer: Medicare Other

## 2016-03-18 DIAGNOSIS — I517 Cardiomegaly: Secondary | ICD-10-CM

## 2016-03-18 DIAGNOSIS — I509 Heart failure, unspecified: Secondary | ICD-10-CM

## 2016-03-18 DIAGNOSIS — E1122 Type 2 diabetes mellitus with diabetic chronic kidney disease: Secondary | ICD-10-CM

## 2016-03-18 DIAGNOSIS — R7989 Other specified abnormal findings of blood chemistry: Secondary | ICD-10-CM

## 2016-03-18 DIAGNOSIS — I130 Hypertensive heart and chronic kidney disease with heart failure and stage 1 through stage 4 chronic kidney disease, or unspecified chronic kidney disease: Secondary | ICD-10-CM | POA: Diagnosis not present

## 2016-03-18 DIAGNOSIS — N182 Chronic kidney disease, stage 2 (mild): Secondary | ICD-10-CM

## 2016-03-18 DIAGNOSIS — I10 Essential (primary) hypertension: Secondary | ICD-10-CM

## 2016-03-18 DIAGNOSIS — N183 Chronic kidney disease, stage 3 (moderate): Secondary | ICD-10-CM | POA: Diagnosis not present

## 2016-03-18 DIAGNOSIS — I5021 Acute systolic (congestive) heart failure: Secondary | ICD-10-CM | POA: Diagnosis not present

## 2016-03-18 LAB — BASIC METABOLIC PANEL
ANION GAP: 10 (ref 5–15)
BUN: 27 mg/dL — ABNORMAL HIGH (ref 6–20)
CO2: 24 mmol/L (ref 22–32)
Calcium: 10.2 mg/dL (ref 8.9–10.3)
Chloride: 105 mmol/L (ref 101–111)
Creatinine, Ser: 1.57 mg/dL — ABNORMAL HIGH (ref 0.44–1.00)
GFR, EST AFRICAN AMERICAN: 36 mL/min — AB (ref >60)
GFR, EST NON AFRICAN AMERICAN: 31 mL/min — AB (ref >60)
Glucose, Bld: 145 mg/dL — ABNORMAL HIGH (ref 65–99)
POTASSIUM: 3.4 mmol/L — AB (ref 3.5–5.1)
SODIUM: 139 mmol/L (ref 135–145)

## 2016-03-18 LAB — LIPID PANEL
Cholesterol: 171 mg/dL (ref 0–200)
HDL: 56 mg/dL (ref >40)
LDL CALC: 100 mg/dL — AB (ref 0–99)
TRIGLYCERIDES: 77 mg/dL (ref <150)
Total CHOL/HDL Ratio: 3.1 RATIO
VLDL: 15 mg/dL (ref 0–40)

## 2016-03-18 LAB — GLUCOSE, CAPILLARY
GLUCOSE-CAPILLARY: 156 mg/dL — AB (ref 65–99)
GLUCOSE-CAPILLARY: 66 mg/dL (ref 65–99)
Glucose-Capillary: 132 mg/dL — ABNORMAL HIGH (ref 65–99)
Glucose-Capillary: 147 mg/dL — ABNORMAL HIGH (ref 65–99)
Glucose-Capillary: 137 mg/dL — ABNORMAL HIGH (ref 65–99)

## 2016-03-18 LAB — URINALYSIS, ROUTINE W REFLEX MICROSCOPIC
Bilirubin Urine: NEGATIVE
GLUCOSE, UA: NEGATIVE mg/dL
Hgb urine dipstick: NEGATIVE
Ketones, ur: NEGATIVE mg/dL
LEUKOCYTES UA: NEGATIVE
Nitrite: NEGATIVE
PROTEIN: NEGATIVE mg/dL
Specific Gravity, Urine: 1.02 (ref 1.005–1.030)
pH: 6 (ref 5.0–8.0)

## 2016-03-18 LAB — RETICULOCYTES
RBC.: 3.69 MIL/uL — ABNORMAL LOW (ref 3.87–5.11)
RETIC COUNT ABSOLUTE: 36.9 10*3/uL (ref 19.0–186.0)
RETIC CT PCT: 1 % (ref 0.4–3.1)

## 2016-03-18 LAB — FOLATE: Folate: 40 ng/mL (ref >5.9)

## 2016-03-18 LAB — VITAMIN B12: Vitamin B-12: 911 pg/mL (ref 180–914)

## 2016-03-18 LAB — ECHOCARDIOGRAM COMPLETE
HEIGHTINCHES: 66 in
WEIGHTICAEL: 3460.34 [oz_av]

## 2016-03-18 LAB — TSH: TSH: 2.97 u[IU]/mL (ref 0.350–4.500)

## 2016-03-18 LAB — IRON AND TIBC
Iron: 55 ug/dL (ref 28–170)
Saturation Ratios: 23 % (ref 10.4–31.8)
TIBC: 244 ug/dL — ABNORMAL LOW (ref 250–450)
UIBC: 189 ug/dL

## 2016-03-18 LAB — TROPONIN I
TROPONIN I: 0.18 ng/mL — AB (ref <0.031)
Troponin I: 0.2 ng/mL — ABNORMAL HIGH (ref <0.031)

## 2016-03-18 LAB — MAGNESIUM: Magnesium: 1.7 mg/dL (ref 1.7–2.4)

## 2016-03-18 LAB — FERRITIN: FERRITIN: 136 ng/mL (ref 11–307)

## 2016-03-18 LAB — BRAIN NATRIURETIC PEPTIDE: B Natriuretic Peptide: 521 pg/mL — ABNORMAL HIGH (ref 0.0–100.0)

## 2016-03-18 MED ORDER — SODIUM CHLORIDE 0.9% FLUSH
3.0000 mL | Freq: Two times a day (BID) | INTRAVENOUS | Status: DC
  Administered 2016-03-18 – 2016-03-21 (×8): 3 mL via INTRAVENOUS

## 2016-03-18 MED ORDER — ENOXAPARIN SODIUM 30 MG/0.3ML ~~LOC~~ SOLN: 30.0000 mg | SUBCUTANEOUS | Status: DC

## 2016-03-18 MED ORDER — ASPIRIN EC 81 MG PO TBEC
81.0000 mg | DELAYED_RELEASE_TABLET | Freq: Every day | ORAL | Status: DC
  Administered 2016-03-19 – 2016-03-23 (×3): 81 mg via ORAL
  Filled 2016-03-18 (×6): qty 1

## 2016-03-18 MED ORDER — CAPTOPRIL 50 MG PO TABS
50.0000 mg | ORAL_TABLET | Freq: Two times a day (BID) | ORAL | Status: DC
  Administered 2016-03-18 (×2): 50 mg via ORAL
  Filled 2016-03-18 (×3): qty 1

## 2016-03-18 MED ORDER — AMLODIPINE BESYLATE 5 MG PO TABS
5.0000 mg | ORAL_TABLET | Freq: Every day | ORAL | Status: DC
  Filled 2016-03-18: qty 1

## 2016-03-18 MED ORDER — TRAMADOL HCL 50 MG PO TABS: 50.0000 mg | ORAL_TABLET | Freq: Four times a day (QID) | ORAL | Status: DC | PRN

## 2016-03-18 MED ORDER — PANTOPRAZOLE SODIUM 40 MG PO TBEC
40.0000 mg | DELAYED_RELEASE_TABLET | Freq: Every day | ORAL | Status: DC
  Administered 2016-03-18 – 2016-03-23 (×5): 40 mg via ORAL
  Filled 2016-03-18 (×7): qty 1

## 2016-03-18 MED ORDER — INSULIN ASPART 100 UNIT/ML ~~LOC~~ SOLN
0.0000 [IU] | Freq: Three times a day (TID) | SUBCUTANEOUS | Status: DC
  Administered 2016-03-18 – 2016-03-19 (×4): 2 [IU] via SUBCUTANEOUS
  Administered 2016-03-19: 5 [IU] via SUBCUTANEOUS
  Administered 2016-03-19: 2 [IU] via SUBCUTANEOUS
  Administered 2016-03-20: 3 [IU] via SUBCUTANEOUS
  Administered 2016-03-20: 2 [IU] via SUBCUTANEOUS
  Administered 2016-03-21: 3 [IU] via SUBCUTANEOUS
  Administered 2016-03-22: 2 [IU] via SUBCUTANEOUS

## 2016-03-18 MED ORDER — NORTRIPTYLINE HCL 10 MG PO CAPS
10.0000 mg | ORAL_CAPSULE | Freq: Every day | ORAL | Status: DC
  Administered 2016-03-18 – 2016-03-22 (×5): 10 mg via ORAL
  Filled 2016-03-18 (×8): qty 1

## 2016-03-18 MED ORDER — INSULIN ASPART 100 UNIT/ML ~~LOC~~ SOLN: 0.0000 [IU] | Freq: Every day | SUBCUTANEOUS | Status: DC

## 2016-03-18 MED ORDER — CARVEDILOL 3.125 MG PO TABS
3.1250 mg | ORAL_TABLET | Freq: Two times a day (BID) | ORAL | Status: DC
  Administered 2016-03-18 – 2016-03-23 (×9): 3.125 mg via ORAL
  Filled 2016-03-18 (×9): qty 1

## 2016-03-18 MED ORDER — FERROUS SULFATE 325 (65 FE) MG PO TABS
650.0000 mg | ORAL_TABLET | Freq: Every day | ORAL | Status: DC
Start: 2016-03-18 — End: 2016-03-23
  Administered 2016-03-18: 325 mg via ORAL
  Administered 2016-03-19 – 2016-03-20 (×2): 650 mg via ORAL
  Administered 2016-03-21: 325 mg via ORAL
  Administered 2016-03-23: 650 mg via ORAL
  Filled 2016-03-18 (×6): qty 2

## 2016-03-18 MED ORDER — SODIUM CHLORIDE 0.9% FLUSH: 3.0000 mL | INTRAVENOUS | Status: DC | PRN

## 2016-03-18 MED ORDER — ACETAMINOPHEN 325 MG PO TABS
650.0000 mg | ORAL_TABLET | ORAL | Status: DC | PRN
  Administered 2016-03-18 (×2): 650 mg via ORAL
  Filled 2016-03-18 (×2): qty 2

## 2016-03-18 MED ORDER — PRAVASTATIN SODIUM 40 MG PO TABS
40.0000 mg | ORAL_TABLET | Freq: Every day | ORAL | Status: DC
  Administered 2016-03-18 – 2016-03-22 (×5): 40 mg via ORAL
  Filled 2016-03-18 (×6): qty 1

## 2016-03-18 MED ORDER — ONDANSETRON HCL 4 MG/2ML IJ SOLN: 4.0000 mg | Freq: Four times a day (QID) | INTRAMUSCULAR | Status: DC | PRN

## 2016-03-18 MED ORDER — ENOXAPARIN SODIUM 40 MG/0.4ML ~~LOC~~ SOLN
40.0000 mg | SUBCUTANEOUS | Status: DC
  Administered 2016-03-18 – 2016-03-21 (×4): 40 mg via SUBCUTANEOUS
  Filled 2016-03-18 (×5): qty 0.4

## 2016-03-18 MED ORDER — FUROSEMIDE 10 MG/ML IJ SOLN
40.0000 mg | Freq: Every day | INTRAMUSCULAR | Status: DC
  Administered 2016-03-18: 40 mg via INTRAVENOUS
  Filled 2016-03-18: qty 4

## 2016-03-18 MED ORDER — SODIUM CHLORIDE 0.9 % IV SOLN: 250.0000 mL | INTRAVENOUS | Status: DC | PRN

## 2016-03-18 NOTE — Progress Notes (Signed)
*  PRELIMINARY RESULTS* Echocardiogram 2D Echocardiogram has been performed.  Leavy Cella 03/18/2016, 1:22 PM

## 2016-03-18 NOTE — Progress Notes (Signed)
[facility] PROGRESS NOTE    Progress Note  Mandy Rhodes  [id number] DOB: 1940-12-12 DOA: 03/17/2016 PCP: Vidal Schwalbe, MD  Outpatient Specialists:    Brief Narrative:   Mandy Rhodes is an 75 y.o. female past medical history of diabetes mellitus chronic kidney disease stage 3, with a baseline creatinine of 1.6-2.0, hypertension who comes to the ED complaining of cough and cold symptoms 5 days prior to admission.  Assessment/Plan:   Cardiomegaly: She already received some IV Lasix she seems to be euvolemic on physical exam. Follow strict I's and O's and daily weight, 2-D echo is pending. He had no events on telemetry. Agree with beta blocker, DC Norvasc avoid NSAID's.    CKD (chronic kidney disease) stage 2, GFR 60-89 ml/min Creatinine at baseline, Resume captopril.  Elevated troponin: Her cardiac biomarkers have remained flat there is likely due to chronic kidney disease.  Essential hypertension: Blood Pressure not at goal DC Norvasc restart captopril agree with beta blocker. They will have to be titrated up as tolerated.  DM type 2 causing CKD stage 2 ([place]): Good control continue sliding scale insulin A1c is pending.  DVT prophylaxis: heparin order Family Communication:none Disposition Plan: inpatient Code Status:     Code Status Orders        Start     Ordered   03/18/16 0020  Do not attempt resuscitation (DNR)   Continuous    Question Answer Comment  In the event of cardiac or respiratory ARREST Do not call a "code blue"   In the event of cardiac or respiratory ARREST Do not perform Intubation, CPR, defibrillation or ACLS   In the event of cardiac or respiratory ARREST Use medication by any route, position, wound care, and other measures to relive pain and suffering. May use oxygen, suction and manual treatment of airway obstruction as needed for comfort.      03/18/16 0020    Code Status History    Date Active Date Inactive Code Status  Order ID Comments User Context   This patient has a current code status but no historical code status.    Advance Directive Documentation        Most Recent Value   Type of Advance Directive  Healthcare Power of Attorney, Living will   Pre-existing out of facility DNR order (yellow form or pink MOST form)     "MOST" Form in Place?          IV Access:    Peripheral IV   Procedures and diagnostic studies:   Dg Chest 2 View  03/17/2016  CLINICAL DATA:  Productive cough. EXAM: CHEST  2 VIEW COMPARISON:  No recent. FINDINGS: Cardiomegaly with normal pulmonary vascularity. No focal infiltrate. No pleural effusion or pneumothorax. No acute bony abnormality . IMPRESSION: Cardiomegaly. No overt congestive heart failure. Exam otherwise unremarkable. Electronically Signed   By: Marcello Moores  Register   On: 03/17/2016 16:35     Medical Consultants:    None.  Anti-Infectives:  none  Subjective:    Mandy Rhodes she relates her cough is better she has never had orthopnea  Objective:    Filed Vitals:   03/17/16 2230 03/17/16 2231 03/18/16 0020 03/18/16 0618  BP: 137/126 127/80 148/98 140/78  Pulse: 25  74 69  Temp:   97.7 F (36.5 C) 98 F (36.7 C)  TempSrc:   Oral Oral  Resp: 17 18 18 18   Height:   5\' 6"  (1.676 m)   Weight:  98.1 kg (216 lb 4.3 oz)   SpO2: 98%  99% 100%   No intake or output data in the 24 hours ending 03/18/16 0813 Filed Weights   03/18/16 0020  Weight: 98.1 kg (216 lb 4.3 oz)    Exam: General exam: In no acute distress. Respiratory system: Good air movement and clear to auscultation. Cardiovascular system: S1 & S2 heard, RRR. No JVD, murmurs, rubs, gallops or clicks.  Gastrointestinal system: Abdomen is nondistended, soft and nontender.  Central nervous system: Alert and oriented. No focal neurological deficits. Extremities: No Lower extremity edema. Skin: No rashes, lesions or ulcers Psychiatry: Judgement and insight appear normal. Mood &  affect appropriate.    Data Reviewed:    Labs: Basic Metabolic Panel:  Recent Labs Lab 03/17/16 1818 03/18/16 0625  NA 139 139  K 4.0 3.4*  CL 104 105  CO2 25 24  GLUCOSE 121* 145*  BUN 27* 27*  CREATININE 1.68* 1.57*  CALCIUM 10.2 10.2  MG  --  1.7   GFR Estimated Creatinine Clearance: 37.1 mL/min (by C-G formula based on Cr of 1.57). Liver Function Tests:  Recent Labs Lab 03/17/16 1818  AST 21  ALT 20  ALKPHOS 76  BILITOT 0.6  PROT 7.2  ALBUMIN 4.0   No results for input(s): LIPASE, AMYLASE in the last 168 hours. No results for input(s): AMMONIA in the last 168 hours. Coagulation profile No results for input(s): INR, PROTIME in the last 168 hours.  CBC:  Recent Labs Lab 03/17/16 1818  WBC 4.4  NEUTROABS 2.4  HGB 11.0*  HCT 32.7*  MCV 86.3  PLT 170   Cardiac Enzymes:  Recent Labs Lab 03/17/16 1818 03/18/16 0032 03/18/16 0625  TROPONINI 0.18* 0.20* 0.18*   BNP (last 3 results) No results for input(s): PROBNP in the last 8760 hours. CBG:  Recent Labs Lab 03/18/16 0025 03/18/16 0802  GLUCAP 66 132*   D-Dimer: No results for input(s): DDIMER in the last 72 hours. Hgb A1c: No results for input(s): HGBA1C in the last 72 hours. Lipid Profile: No results for input(s): CHOL, HDL, LDLCALC, TRIG, CHOLHDL, LDLDIRECT in the last 72 hours. Thyroid function studies:  Recent Labs  03/18/16 0626  TSH 2.970   Anemia work up:  Recent Labs  03/18/16 0625  RETICCTPCT 1.0   Sepsis Labs:  Recent Labs Lab 03/17/16 1818  WBC 4.4   Microbiology No results found for this or any previous visit (from the past 240 hour(s)).   Medications:   . amLODipine  5 mg Oral Daily  . aspirin EC  81 mg Oral Daily  . carvedilol  3.125 mg Oral BID WC  . enoxaparin (LOVENOX) injection  40 mg Subcutaneous Q24H  . ferrous sulfate  650 mg Oral Daily  . furosemide  40 mg Intravenous Daily  . insulin aspart  0-15 Units Subcutaneous TID WC  . insulin  aspart  0-5 Units Subcutaneous QHS  . nortriptyline  10 mg Oral QHS  . pantoprazole  40 mg Oral Daily  . pravastatin  40 mg Oral q1800  . sodium chloride flush  3 mL Intravenous Q12H   Continuous Infusions:   Time spent: 25 min     Mandy Rhodes  [facility] Pager [phone]  *Please refer to [email or web address], password [email or web address] to get updated schedule on who will round on this patient, as hospitalists switch teams weekly. If 7PM-7AM, please contact night-coverage at [email or web address], password [email or web address] for any overnight needs.  03/18/2016, 8:13 AM

## 2016-03-18 NOTE — Consult Note (Signed)
CONSULTATION NOTE  Reason for Consult: Elevated troponin and cardiomegaly  Requesting Physician: Dr. Roel Cluck  Cardiologist: None (NEW)  HPI: This is a 75 y.o. female with a past medical history significant for IDDM, CKD 3, HTN, and Anemia, presents with cough and URI symptoms for 1 week, worse when lying down flat. She denies DOE or chest pain. Her cough is non-productive. CXR in the ER shows cardiomegaly without acute congestive heart failure. No infiltrates. Labs on admission shows an elevated BNP to 378. Troponin mildly elevated and flat (0.19, 0.18, 0.2, 0.18). This is likely consistent with CHF. She was started on lasix overnight. No I's/O's are recorded. She is on captopril and low dose carvedilol, aspirin, and pravastatin 40 mg daily. Overnight she reports brisk diuresis and breathing has improved.  PMHx:  Past Medical History  Diagnosis Date  . Anemia   . Hypertension   . History of spinal stenosis   . History of rotator cuff tear     Torn right rotator cuff.  . Neuropathy associated with endocrine disorder ([place])   . Diabetes mellitus     type 2  . Chronic kidney disease     Renal Insuffiency, see [facility] once a year  . GERD (gastroesophageal reflux disease)   . Arthritis    Past Surgical History  Procedure Laterality Date  . Neuroplasty / transposition median nerve at carpal tunnel Bilateral     Hx  Left carpal tunnel repair  . Cholecystectomy  1969    Status post   . Abdominal hysterectomy  1971    Partial  . Back surgery  2009    Disectomy  . Appendectomy    . Foot surgery Bilateral     bone removed  . Cyst removal trunk Right 12/03/2014    Procedure: EXCISION OF RIGHT BACK CYST ;  Surgeon: Coralie Keens, MD;  Location: [facility];  Service: General;  Laterality: Right;    FAMHx: Family History  Problem Relation Age of Onset  . Diabetes type II Mother   . Hypertension Father   . Diabetes type II Sister   . Ovarian cancer Sister   . Diabetes  type II Other   . Stroke Neg Hx   . CAD Neg Hx   . Heart failure Neg Hx     SOCHx:  reports that she has quit smoking. She does not have any smokeless tobacco history on file. She reports that she does not drink alcohol or use illicit drugs.  ALLERGIES: Allergies  Allergen Reactions  . Sulfa Antibiotics Swelling    ROS: Pertinent items noted in HPI and remainder of comprehensive ROS otherwise negative.  HOME MEDICATIONS:   Medication List    ASK your doctor about these medications        acetaminophen-codeine 300-30 MG tablet  Commonly known as:  TYLENOL #3  Take 1 tablet by mouth every 6 (six) hours as needed for moderate pain.     amLODipine 5 MG tablet  Commonly known as:  NORVASC  Take 5 mg by mouth daily.     CALCIUM 1200 PO  Take 1,200 mg by mouth daily.     captopril 25 MG tablet  Commonly known as:  CAPOTEN  Take 50 mg by mouth 2 (two) times daily.     cholecalciferol 1000 units tablet  Commonly known as:  VITAMIN D  Take 1,000 Units by mouth daily.     ferrous sulfate 325 (65 FE) MG EC tablet  Take 650  mg by mouth daily.     glimepiride 1 MG tablet  Commonly known as:  AMARYL  Take 1 mg by mouth 2 (two) times daily.     hydrochlorothiazide 25 MG tablet  Commonly known as:  HYDRODIURIL  Take 25 mg by mouth daily.     ibuprofen 200 MG tablet  Commonly known as:  ADVIL,MOTRIN  Take 200 mg by mouth as needed (pain).     multivitamin tablet  Take 1 tablet by mouth daily.     nortriptyline 10 MG capsule  Commonly known as:  PAMELOR  Take 10 mg by mouth at bedtime.     omeprazole 40 MG capsule  Commonly known as:  PRILOSEC  Take 40 mg by mouth daily.     pravastatin 40 MG tablet  Commonly known as:  PRAVACHOL  Take 40 mg by mouth daily.     PRESCRIPTION MEDICATION  1 drop every 3 (three) months. Eye drops to be used the day before, of , and after eye injections     traMADol 50 MG tablet  Commonly known as:  ULTRAM  Take 50 mg by mouth  every 6 (six) hours as needed.        HOSPITAL MEDICATIONS: Prior to Admission:  Prescriptions prior to admission  Medication Sig Dispense Refill Last Dose  . amLODipine (NORVASC) 5 MG tablet Take 5 mg by mouth daily.     03/16/2016 at Unknown time  . Calcium Carbonate-Vit D-Min (CALCIUM 1200 PO) Take 1,200 mg by mouth daily.   03/17/2016 at Unknown time  . captopril (CAPOTEN) 25 MG tablet Take 50 mg by mouth 2 (two) times daily.    03/17/2016 at 1200  . cholecalciferol (VITAMIN D) 1000 UNITS tablet Take 1,000 Units by mouth daily.    03/16/2016 at Unknown time  . ferrous sulfate 325 (65 FE) MG EC tablet Take 650 mg by mouth daily.    03/16/2016 at Unknown time  . glimepiride (AMARYL) 1 MG tablet Take 1 mg by mouth 2 (two) times daily.    03/17/2016 at Unknown time  . hydrochlorothiazide (HYDRODIURIL) 25 MG tablet Take 25 mg by mouth daily.   03/17/2016 at Unknown time  . Multiple Vitamin (MULTIVITAMIN) tablet Take 1 tablet by mouth daily.     03/17/2016 at Unknown time  . nortriptyline (PAMELOR) 10 MG capsule Take 10 mg by mouth at bedtime.   03/16/2016 at Unknown time  . omeprazole (PRILOSEC) 40 MG capsule Take 40 mg by mouth daily.     03/17/2016 at Unknown time  . pravastatin (PRAVACHOL) 40 MG tablet Take 40 mg by mouth daily.     03/16/2016 at Unknown time  . traMADol (ULTRAM) 50 MG tablet Take 50 mg by mouth every 6 (six) hours as needed.  0 Past Week at Unknown time  . acetaminophen-codeine (TYLENOL #3) 300-30 MG per tablet Take 1 tablet by mouth every 6 (six) hours as needed for moderate pain. (Patient not taking: Reported on 03/17/2016) 15 tablet 0 Completed Course at Unknown time  . ibuprofen (ADVIL,MOTRIN) 200 MG tablet Take 200 mg by mouth as needed (pain).    unknown  . PRESCRIPTION MEDICATION 1 drop every 3 (three) months. Eye drops to be used the day before, of , and after eye injections   2 months    VITALS: Blood pressure 140/78, pulse 69, temperature 98 F (36.7 C), temperature  source Oral, resp. rate 18, height '5\' 6"'  (1.676 m), weight 216 lb 4.3 oz (98.1 kg),  SpO2 100 %.  PHYSICAL EXAM: General appearance: alert, no distress and moderately obese Neck: JVD - 3 cm above sternal notch and no carotid bruit Lungs: clear to auscultation bilaterally Heart: regular rate and rhythm, S1, S2 normal, no murmur, click, rub or gallop Abdomen: soft, non-tender; bowel sounds normal; no masses,  no organomegaly and obese Extremities: extremities normal, atraumatic, no cyanosis or edema Pulses: 2+ and symmetric Skin: Skin color, texture, turgor normal. No rashes or lesions Neurologic: Grossly normal Psych: Pleasant  LABS: Results for orders placed or performed during the hospital encounter of 03/17/16 (from the past 48 hour(s))  I-stat troponin, ED     Status: Abnormal   Collection Time: 03/17/16  5:54 PM  Result Value Ref Range   Troponin i, poc 0.19 (HH) 0.00 - 0.08 ng/mL   Comment NOTIFIED PHYSICIAN    Comment 3            Comment: Due to the release kinetics of cTnI, a negative result within the first hours of the onset of symptoms does not rule out myocardial infarction with certainty. If myocardial infarction is still suspected, repeat the test at appropriate intervals.   CBC with Differential     Status: Abnormal   Collection Time: 03/17/16  6:18 PM  Result Value Ref Range   WBC 4.4 4.0 - 10.5 K/uL   RBC 3.79 (L) 3.87 - 5.11 MIL/uL   Hemoglobin 11.0 (L) 12.0 - 15.0 g/dL   HCT 32.7 (L) 36.0 - 46.0 %   MCV 86.3 78.0 - 100.0 fL   MCH 29.0 26.0 - 34.0 pg   MCHC 33.6 30.0 - 36.0 g/dL   RDW 14.8 11.5 - 15.5 %   Platelets 170 150 - 400 K/uL   Neutrophils Relative % 56 %   Neutro Abs 2.4 1.7 - 7.7 K/uL   Lymphocytes Relative 34 %   Lymphs Abs 1.5 0.7 - 4.0 K/uL   Monocytes Relative 8 %   Monocytes Absolute 0.4 0.1 - 1.0 K/uL   Eosinophils Relative 2 %   Eosinophils Absolute 0.1 0.0 - 0.7 K/uL   Basophils Relative 0 %   Basophils Absolute 0.0 0.0 - 0.1 K/uL    Comprehensive metabolic panel     Status: Abnormal   Collection Time: 03/17/16  6:18 PM  Result Value Ref Range   Sodium 139 135 - 145 mmol/L   Potassium 4.0 3.5 - 5.1 mmol/L   Chloride 104 101 - 111 mmol/L   CO2 25 22 - 32 mmol/L   Glucose, Bld 121 (H) 65 - 99 mg/dL   BUN 27 (H) 6 - 20 mg/dL   Creatinine, Ser 1.68 (H) 0.44 - 1.00 mg/dL   Calcium 10.2 8.9 - 10.3 mg/dL   Total Protein 7.2 6.5 - 8.1 g/dL   Albumin 4.0 3.5 - 5.0 g/dL   AST 21 15 - 41 U/L   ALT 20 14 - 54 U/L   Alkaline Phosphatase 76 38 - 126 U/L   Total Bilirubin 0.6 0.3 - 1.2 mg/dL   GFR calc non Af Amer 29 (L) >60 mL/min   GFR calc Af Amer 33 (L) >60 mL/min    Comment: (NOTE) The eGFR has been calculated using the CKD EPI equation. This calculation has not been validated in all clinical situations. eGFR's persistently <60 mL/min signify possible Chronic Kidney Disease.    Anion gap 10 5 - 15  Brain natriuretic peptide     Status: Abnormal   Collection Time: 03/17/16  6:18  PM  Result Value Ref Range   B Natriuretic Peptide 377.8 (H) 0.0 - 100.0 pg/mL  Troponin I     Status: Abnormal   Collection Time: 03/17/16  6:18 PM  Result Value Ref Range   Troponin I 0.18 (H) <0.031 ng/mL    Comment:        PERSISTENTLY INCREASED TROPONIN VALUES IN THE RANGE OF 0.04-0.49 ng/mL CAN BE SEEN IN:       -UNSTABLE ANGINA       -CONGESTIVE HEART FAILURE       -MYOCARDITIS       -CHEST TRAUMA       -ARRYHTHMIAS       -LATE PRESENTING MYOCARDIAL INFARCTION       -COPD   CLINICAL FOLLOW-UP RECOMMENDED.   Urinalysis, Routine w reflex microscopic (not at [facility])     Status: Abnormal   Collection Time: 03/17/16 10:34 PM  Result Value Ref Range   Color, Urine YELLOW YELLOW   APPearance CLOUDY (A) CLEAR   Specific Gravity, Urine 1.020 1.005 - 1.030   pH 6.0 5.0 - 8.0   Glucose, UA NEGATIVE NEGATIVE mg/dL   Hgb urine dipstick NEGATIVE NEGATIVE   Bilirubin Urine NEGATIVE NEGATIVE   Ketones, ur NEGATIVE NEGATIVE mg/dL    Protein, ur NEGATIVE NEGATIVE mg/dL   Nitrite NEGATIVE NEGATIVE   Leukocytes, UA NEGATIVE NEGATIVE    Comment: MICROSCOPIC NOT DONE ON URINES WITH NEGATIVE PROTEIN, BLOOD, LEUKOCYTES, NITRITE, OR GLUCOSE <1000 mg/dL.  Glucose, capillary     Status: None   Collection Time: 03/18/16 12:25 AM  Result Value Ref Range   Glucose-Capillary 66 65 - 99 mg/dL  Troponin I     Status: Abnormal   Collection Time: 03/18/16 12:32 AM  Result Value Ref Range   Troponin I 0.20 (H) <0.031 ng/mL    Comment:        PERSISTENTLY INCREASED TROPONIN VALUES IN THE RANGE OF 0.04-0.49 ng/mL CAN BE SEEN IN:       -UNSTABLE ANGINA       -CONGESTIVE HEART FAILURE       -MYOCARDITIS       -CHEST TRAUMA       -ARRYHTHMIAS       -LATE PRESENTING MYOCARDIAL INFARCTION       -COPD   CLINICAL FOLLOW-UP RECOMMENDED.   Troponin I     Status: Abnormal   Collection Time: 03/18/16  6:25 AM  Result Value Ref Range   Troponin I 0.18 (H) <0.031 ng/mL    Comment:        PERSISTENTLY INCREASED TROPONIN VALUES IN THE RANGE OF 0.04-0.49 ng/mL CAN BE SEEN IN:       -UNSTABLE ANGINA       -CONGESTIVE HEART FAILURE       -MYOCARDITIS       -CHEST TRAUMA       -ARRYHTHMIAS       -LATE PRESENTING MYOCARDIAL INFARCTION       -COPD   CLINICAL FOLLOW-UP RECOMMENDED.   Basic metabolic panel     Status: Abnormal   Collection Time: 03/18/16  6:25 AM  Result Value Ref Range   Sodium 139 135 - 145 mmol/L   Potassium 3.4 (L) 3.5 - 5.1 mmol/L   Chloride 105 101 - 111 mmol/L   CO2 24 22 - 32 mmol/L   Glucose, Bld 145 (H) 65 - 99 mg/dL   BUN 27 (H) 6 - 20 mg/dL   Creatinine, Ser 1.57 (H) 0.44 - 1.00  mg/dL   Calcium 10.2 8.9 - 10.3 mg/dL   GFR calc non Af Amer 31 (L) >60 mL/min   GFR calc Af Amer 36 (L) >60 mL/min    Comment: (NOTE) The eGFR has been calculated using the CKD EPI equation. This calculation has not been validated in all clinical situations. eGFR's persistently <60 mL/min signify possible Chronic  Kidney Disease.    Anion gap 10 5 - 15  Magnesium     Status: None   Collection Time: 03/18/16  6:25 AM  Result Value Ref Range   Magnesium 1.7 1.7 - 2.4 mg/dL  Reticulocytes     Status: Abnormal   Collection Time: 03/18/16  6:25 AM  Result Value Ref Range   Retic Ct Pct 1.0 0.4 - 3.1 %   RBC. 3.69 (L) 3.87 - 5.11 MIL/uL   Retic Count, Manual 36.9 19.0 - 186.0 K/uL  TSH     Status: None   Collection Time: 03/18/16  6:26 AM  Result Value Ref Range   TSH 2.970 0.350 - 4.500 uIU/mL  Brain natriuretic peptide     Status: Abnormal   Collection Time: 03/18/16  6:26 AM  Result Value Ref Range   B Natriuretic Peptide 521.0 (H) 0.0 - 100.0 pg/mL  Glucose, capillary     Status: Abnormal   Collection Time: 03/18/16  8:02 AM  Result Value Ref Range   Glucose-Capillary 132 (H) 65 - 99 mg/dL    IMAGING: Dg Chest 2 View  03/17/2016  CLINICAL DATA:  Productive cough. EXAM: CHEST  2 VIEW COMPARISON:  No recent. FINDINGS: Cardiomegaly with normal pulmonary vascularity. No focal infiltrate. No pleural effusion or pneumothorax. No acute bony abnormality . IMPRESSION: Cardiomegaly. No overt congestive heart failure. Exam otherwise unremarkable. Electronically Signed   By: Marcello Moores  Register   On: 03/17/2016 16:35    HOSPITAL DIAGNOSES: Active Problems:   Diabetes mellitus ([place])   Cardiomegaly   CKD (chronic kidney disease) stage 2, GFR 60-89 ml/min   Elevated troponin   Essential hypertension   DM type 2 causing CKD stage 2 ([facility])   Elevated troponin I level   IMPRESSION: 1. Mild acute congestive heart failure, NYHA Class II symptoms (mostly cough, minimal orthopnea) 2. CKD 2-3 3. Elevated troponin likely secondary to CHF 4. HTN 5. IDDM 6. Dyslipidemia  RECOMMENDATION: 1. Presentation is consistent with congestive heart failure or volume overload secondary to CKD. Agree with echo today to help determine etiology. Multiple coronary risk factors. If LV function is decreased, she will  likely need an ischemia evaluation. Continue lasix for now.   Thanks for the consultation. Cardiology will follow with you.  Time Spent Directly with Patient: 30 minutes  Pixie Casino, MD, [facility] Attending Cardiologist [place] 03/18/2016, 8:22 AM

## 2016-03-19 DIAGNOSIS — I5021 Acute systolic (congestive) heart failure: Secondary | ICD-10-CM | POA: Diagnosis not present

## 2016-03-19 DIAGNOSIS — R059 Cough, unspecified: Secondary | ICD-10-CM | POA: Insufficient documentation

## 2016-03-19 DIAGNOSIS — N182 Chronic kidney disease, stage 2 (mild): Secondary | ICD-10-CM | POA: Diagnosis not present

## 2016-03-19 DIAGNOSIS — I517 Cardiomegaly: Secondary | ICD-10-CM | POA: Diagnosis not present

## 2016-03-19 DIAGNOSIS — R7989 Other specified abnormal findings of blood chemistry: Secondary | ICD-10-CM | POA: Diagnosis not present

## 2016-03-19 DIAGNOSIS — R05 Cough: Secondary | ICD-10-CM

## 2016-03-19 LAB — BASIC METABOLIC PANEL
Anion gap: 10 (ref 5–15)
BUN: 42 mg/dL — AB (ref 6–20)
CO2: 26 mmol/L (ref 22–32)
Calcium: 9.7 mg/dL (ref 8.9–10.3)
Chloride: 101 mmol/L (ref 101–111)
Creatinine, Ser: 2.02 mg/dL — ABNORMAL HIGH (ref 0.44–1.00)
GFR, EST AFRICAN AMERICAN: 27 mL/min — AB (ref >60)
GFR, EST NON AFRICAN AMERICAN: 23 mL/min — AB (ref >60)
Glucose, Bld: 121 mg/dL — ABNORMAL HIGH (ref 65–99)
POTASSIUM: 3.6 mmol/L (ref 3.5–5.1)
SODIUM: 137 mmol/L (ref 135–145)

## 2016-03-19 LAB — GLUCOSE, CAPILLARY
GLUCOSE-CAPILLARY: 124 mg/dL — AB (ref 65–99)
Glucose-Capillary: 151 mg/dL — ABNORMAL HIGH (ref 65–99)
Glucose-Capillary: 218 mg/dL — ABNORMAL HIGH (ref 65–99)
Glucose-Capillary: 128 mg/dL — ABNORMAL HIGH (ref 65–99)

## 2016-03-19 MED ORDER — CAPTOPRIL 25 MG PO TABS
25.0000 mg | ORAL_TABLET | Freq: Two times a day (BID) | ORAL | Status: DC
  Administered 2016-03-19: 25 mg via ORAL
  Filled 2016-03-19 (×3): qty 1

## 2016-03-19 NOTE — Progress Notes (Signed)
PROGRESS NOTE    Mandy Rhodes  [id number] DOB: [birth year] DOA: 03/17/2016 PCP: Vidal Schwalbe, MD  Outpatient Specialists:   Brief Narrative: Mandy Rhodes is an 75 y.o. female past medical history of diabetes mellitus chronic kidney disease stage 3, with a baseline creatinine of 1.6-2.0, hypertension who comes to the ED complaining of cough and cold symptoms 5 days prior to admission.  Assessment & Plan   Acute systolic CHF -Echocardiogram shows EF 35-40% -Patient was on IV Lasix, however her creatinine has been rising -Cardiology consulted and appreciated -Given her increasing creatinine, Lasix held today. Catapres decrease to 25 mg twice a day -May consider Myoview tomorrow -Cardiology feels this may be due to hypertensive or infiltrative disease -Continue to monitor intake and output, daily weights -Currently appears to be euvolemic   Acute on chronic kidney disease, Stage III -Reviewing records from 2012, patient was stage IV at one point in time However improved in 2016 to stage III. -Patient currently diuresing given systolic CHF -Lasix held today -Continue to monitor BMP  Elevated troponin -Her cardiac biomarkers have remained flat there is likely due to chronic kidney disease.  Essential hypertension: -Catapril decreased to 25 mg twice a day, Lasix held today -BP on the soft side.  DM type 2 causing CKD stage 2 ([place]): -Continue insulin sliding scale CBG monitoring -Hemoglobin A1c pending  DVT Prophylaxis  Lovenox  Code Status: DO NOT RESUSCITATE  Family Communication: None at bedside  Disposition Plan: Admitted, pending Myoview and improvement in creatinine as well as heart failure  Consultants Cardiology  Procedures  Echocardiogram  Antibiotics   Anti-infectives    None      Subjective:   Mandy Rhodes seen and examined today.  Patient states she is feeling better. Denies any shortness of breath or chest pain. Feels her edema has  also improved. Patient denies dizziness, chest pain, shortness of breath, abdominal pain, N/V/D/C, new weakness, numbess, tingling.    Objective:   Filed Vitals:   03/18/16 2137 03/19/16 0422 03/19/16 0748 03/19/16 0900  BP: 106/71 108/66 115/64 98/53  Pulse: 80 62 64 68  Temp: 98 F (36.7 C) 98.1 F (36.7 C)    TempSrc: Oral Oral    Resp: 18 18    Height:      Weight:  97.977 kg (216 lb)    SpO2: 100% 98%  99%    Intake/Output Summary (Last 24 hours) at 03/19/16 1047 Last data filed at 03/19/16 0900  Gross per 24 hour  Intake   1560 ml  Output   1625 ml  Net    -65 ml   Filed Weights   03/18/16 0020 03/19/16 0422  Weight: 98.1 kg (216 lb 4.3 oz) 97.977 kg (216 lb)    Exam  General: Well developed, well nourished, NAD, appears stated age  HEENT: NCAT, mucous membranes moist.   Cardiovascular: S1 S2 auscultated, No murmurs, regular rate and rhythm  Respiratory: Clear to auscultation bilaterally with equal chest rise  Abdomen: Soft, nontender, nondistended, + bowel sounds  Extremities: warm dry without cyanosis clubbing. Trace LE edema  Neuro: AAOx3, nonfocal  Psych: Normal affect and demeanor with intact judgement and insight  Data Reviewed: I have personally reviewed following labs and imaging studies  CBC:  Recent Labs Lab 03/17/16 1818  WBC 4.4  NEUTROABS 2.4  HGB 11.0*  HCT 32.7*  MCV 86.3  PLT [id number]   Basic Metabolic Panel:  Recent Labs Lab 03/17/16 1818 03/18/16 0625 03/19/16 0519  NA 139 139 137  K 4.0 3.4* 3.6  CL 104 105 101  CO2 25 24 26   GLUCOSE 121* 145* 121*  BUN 27* 27* 42*  CREATININE 1.68* 1.57* 2.02*  CALCIUM 10.2 10.2 9.7  MG  --  1.7  --    GFR: Estimated Creatinine Clearance: 28.9 mL/min (by C-G formula based on Cr of 2.02). Liver Function Tests:  Recent Labs Lab 03/17/16 1818  AST 21  ALT 20  ALKPHOS 76  BILITOT 0.6  PROT 7.2  ALBUMIN 4.0   No results for input(s): LIPASE, AMYLASE in the last 168 hours. No  results for input(s): AMMONIA in the last 168 hours. Coagulation Profile: No results for input(s): INR, PROTIME in the last 168 hours. Cardiac Enzymes:  Recent Labs Lab 03/17/16 1818 03/18/16 0032 03/18/16 0625  TROPONINI 0.18* 0.20* 0.18*   BNP (last 3 results) No results for input(s): PROBNP in the last 8760 hours. HbA1C: No results for input(s): HGBA1C in the last 72 hours. CBG:  Recent Labs Lab 03/18/16 0802 03/18/16 1318 03/18/16 1710 03/18/16 2129 03/19/16 0732  GLUCAP 132* 147* 137* 156* 124*   Lipid Profile:  Recent Labs  03/18/16 0626  CHOL 171  HDL 56  LDLCALC 100*  TRIG 77  CHOLHDL 3.1   Thyroid Function Tests:  Recent Labs  03/18/16 0626  TSH 2.970   Anemia Panel:  Recent Labs  03/18/16 0625 03/18/16 0626  VITAMINB12  --  911  FOLATE  --  40.0  FERRITIN  --  136  TIBC  --  244*  IRON  --  55  RETICCTPCT 1.0  --    Urine analysis:    Component Value Date/Time   COLORURINE YELLOW 03/17/2016 2234   APPEARANCEUR CLOUDY* 03/17/2016 2234   LABSPEC 1.020 03/17/2016 2234   LABSPEC 1.020 12/22/2010 1142   PHURINE 6.0 03/17/2016 2234   PHURINE 5.0 12/22/2010 1142   GLUCOSEU NEGATIVE 03/17/2016 2234   HGBUR NEGATIVE 03/17/2016 2234   HGBUR Negative 12/22/2010 1142   BILIRUBINUR NEGATIVE 03/17/2016 2234   BILIRUBINUR Negative 12/22/2010 1142   KETONESUR NEGATIVE 03/17/2016 2234   KETONESUR Negative 12/22/2010 1142   PROTEINUR NEGATIVE 03/17/2016 2234   PROTEINUR Negative 12/22/2010 1142   UROBILINOGEN 0.2 07/15/2015 1349   NITRITE NEGATIVE 03/17/2016 2234   NITRITE Negative 12/22/2010 1142   LEUKOCYTESUR NEGATIVE 03/17/2016 2234   LEUKOCYTESUR Negative 12/22/2010 1142   Sepsis Labs: @LABRCNTIP (procalcitonin:4,lacticidven:4)  )No results found for this or any previous visit (from the past 240 hour(s)).    Radiology Studies: Dg Chest 2 View  03/17/2016  CLINICAL DATA:  Productive cough. EXAM: CHEST  2 VIEW COMPARISON:  No recent.  FINDINGS: Cardiomegaly with normal pulmonary vascularity. No focal infiltrate. No pleural effusion or pneumothorax. No acute bony abnormality . IMPRESSION: Cardiomegaly. No overt congestive heart failure. Exam otherwise unremarkable. Electronically Signed   By: Marcello Moores  Register   On: 03/17/2016 16:35     Scheduled Meds: . aspirin EC  81 mg Oral Daily  . captopril  25 mg Oral BID  . carvedilol  3.125 mg Oral BID WC  . enoxaparin (LOVENOX) injection  40 mg Subcutaneous Q24H  . ferrous sulfate  650 mg Oral Daily  . insulin aspart  0-15 Units Subcutaneous TID WC  . insulin aspart  0-5 Units Subcutaneous QHS  . nortriptyline  10 mg Oral QHS  . pantoprazole  40 mg Oral Daily  . pravastatin  40 mg Oral q1800  . sodium chloride flush  3  mL Intravenous Q12H   Continuous Infusions:      Time Spent in minutes   30 minutes  Marceil Welp D.O. on 03/19/2016 at 10:47 AM  Between 7am to 7pm - Pager - [phone]  After 7pm go to [email or web address] - password [email or web address]  And look for the night coverage person covering for me after hours  [facility] Office  [phone]

## 2016-03-19 NOTE — Progress Notes (Signed)
DAILY PROGRESS NOTE  Subjective:  No events overnight. Breathing better today. Net even I's/O's overnight. Appears clinically euvolemic. Echo reviewed yesterday, there is a newly decreased LVEF of 35-40%, severe LVH with bright interstitial septum - ?infiltrative cardiomyopathy such as amyloid, Grade 2 DD, moderate LAE. Creatinine is rising.  Objective:  Temp:  [98 F (36.7 C)-98.3 F (36.8 C)] 98.1 F (36.7 C) (04/30 0422) Pulse Rate:  [62-80] 64 (04/30 0748) Resp:  [18] 18 (04/30 0422) BP: (106-115)/(58-71) 115/64 mmHg (04/30 0748) SpO2:  [98 %-100 %] 98 % (04/30 0422) Weight:  [216 lb (97.977 kg)] 216 lb (97.977 kg) (04/30 0422) Weight change: -4.3 oz (-0.123 kg)  Intake/Output from previous day: 04/29 0701 - 04/30 0700 In: 1660 [P.O.:1560] Out: 6283 [Urine:1495]  Intake/Output from this shift: Total I/O In: 200 [P.O.:200] Out: -   Medications: Current Facility-Administered Medications  Medication Dose Route Frequency Provider Last Rate Last Dose  . 0.9 %  sodium chloride infusion  250 mL Intravenous PRN Toy Baker, MD      . acetaminophen (TYLENOL) tablet 650 mg  650 mg Oral Q4H PRN Toy Baker, MD   650 mg at 03/18/16 2027  . aspirin EC tablet 81 mg  81 mg Oral Daily Toy Baker, MD   81 mg at 03/18/16 1016  . captopril (CAPOTEN) tablet 50 mg  50 mg Oral BID Charlynne Cousins, MD   50 mg at 03/18/16 2234  . carvedilol (COREG) tablet 3.125 mg  3.125 mg Oral BID WC Toy Baker, MD   3.125 mg at 03/19/16 0748  . enoxaparin (LOVENOX) injection 40 mg  40 mg Subcutaneous Q24H Toy Baker, MD   40 mg at 03/19/16 0749  . ferrous sulfate tablet 650 mg  650 mg Oral Daily Toy Baker, MD   325 mg at 03/18/16 1015  . insulin aspart (novoLOG) injection 0-15 Units  0-15 Units Subcutaneous TID WC Toy Baker, MD   2 Units at 03/19/16 0749  . insulin aspart (novoLOG) injection 0-5 Units  0-5 Units Subcutaneous QHS Toy Baker, MD   0 Units at 03/18/16 0040  . nortriptyline (PAMELOR) capsule 10 mg  10 mg Oral QHS Toy Baker, MD   10 mg at 03/18/16 2234  . ondansetron (ZOFRAN) injection 4 mg  4 mg Intravenous Q6H PRN Toy Baker, MD      . pantoprazole (PROTONIX) EC tablet 40 mg  40 mg Oral Daily Toy Baker, MD   40 mg at 03/18/16 1015  . pravastatin (PRAVACHOL) tablet 40 mg  40 mg Oral q1800 Toy Baker, MD   40 mg at 03/18/16 1751  . sodium chloride flush (NS) 0.9 % injection 3 mL  3 mL Intravenous Q12H Toy Baker, MD   3 mL at 03/18/16 2236  . sodium chloride flush (NS) 0.9 % injection 3 mL  3 mL Intravenous PRN Toy Baker, MD      . traMADol (ULTRAM) tablet 50 mg  50 mg Oral Q6H PRN Toy Baker, MD        Physical Exam: General appearance: alert and no distress Lungs: clear to auscultation bilaterally Heart: regular rate and rhythm, S1, S2 normal, no murmur, click, rub or gallop Extremities: edema trace pedal Neurologic: Grossly normal  Lab Results: Results for orders placed or performed during the hospital encounter of 03/17/16 (from the past 48 hour(s))  I-stat troponin, ED     Status: Abnormal   Collection Time: 03/17/16  5:54 PM  Result Value Ref Range  Troponin i, poc 0.19 (HH) 0.00 - 0.08 ng/mL   Comment NOTIFIED PHYSICIAN    Comment 3            Comment: Due to the release kinetics of cTnI, a negative result within the first hours of the onset of symptoms does not rule out myocardial infarction with certainty. If myocardial infarction is still suspected, repeat the test at appropriate intervals.   CBC with Differential     Status: Abnormal   Collection Time: 03/17/16  6:18 PM  Result Value Ref Range   WBC 4.4 4.0 - 10.5 K/uL   RBC 3.79 (L) 3.87 - 5.11 MIL/uL   Hemoglobin 11.0 (L) 12.0 - 15.0 g/dL   HCT 32.7 (L) 36.0 - 46.0 %   MCV 86.3 78.0 - 100.0 fL   MCH 29.0 26.0 - 34.0 pg   MCHC 33.6 30.0 - 36.0 g/dL   RDW 14.8 11.5 -  15.5 %   Platelets 170 150 - 400 K/uL   Neutrophils Relative % 56 %   Neutro Abs 2.4 1.7 - 7.7 K/uL   Lymphocytes Relative 34 %   Lymphs Abs 1.5 0.7 - 4.0 K/uL   Monocytes Relative 8 %   Monocytes Absolute 0.4 0.1 - 1.0 K/uL   Eosinophils Relative 2 %   Eosinophils Absolute 0.1 0.0 - 0.7 K/uL   Basophils Relative 0 %   Basophils Absolute 0.0 0.0 - 0.1 K/uL  Comprehensive metabolic panel     Status: Abnormal   Collection Time: 03/17/16  6:18 PM  Result Value Ref Range   Sodium 139 135 - 145 mmol/L   Potassium 4.0 3.5 - 5.1 mmol/L   Chloride 104 101 - 111 mmol/L   CO2 25 22 - 32 mmol/L   Glucose, Bld 121 (H) 65 - 99 mg/dL   BUN 27 (H) 6 - 20 mg/dL   Creatinine, Ser 1.68 (H) 0.44 - 1.00 mg/dL   Calcium 10.2 8.9 - 10.3 mg/dL   Total Protein 7.2 6.5 - 8.1 g/dL   Albumin 4.0 3.5 - 5.0 g/dL   AST 21 15 - 41 U/L   ALT 20 14 - 54 U/L   Alkaline Phosphatase 76 38 - 126 U/L   Total Bilirubin 0.6 0.3 - 1.2 mg/dL   GFR calc non Af Amer 29 (L) >60 mL/min   GFR calc Af Amer 33 (L) >60 mL/min    Comment: (NOTE) The eGFR has been calculated using the CKD EPI equation. This calculation has not been validated in all clinical situations. eGFR's persistently <60 mL/min signify possible Chronic Kidney Disease.    Anion gap 10 5 - 15  Brain natriuretic peptide     Status: Abnormal   Collection Time: 03/17/16  6:18 PM  Result Value Ref Range   B Natriuretic Peptide 377.8 (H) 0.0 - 100.0 pg/mL  Troponin I     Status: Abnormal   Collection Time: 03/17/16  6:18 PM  Result Value Ref Range   Troponin I 0.18 (H) <0.031 ng/mL    Comment:        PERSISTENTLY INCREASED TROPONIN VALUES IN THE RANGE OF 0.04-0.49 ng/mL CAN BE SEEN IN:       -UNSTABLE ANGINA       -CONGESTIVE HEART FAILURE       -MYOCARDITIS       -CHEST TRAUMA       -ARRYHTHMIAS       -LATE PRESENTING MYOCARDIAL INFARCTION       -COPD  CLINICAL FOLLOW-UP RECOMMENDED.   Urinalysis, Routine w reflex microscopic (not at [facility])      Status: Abnormal   Collection Time: 03/17/16 10:34 PM  Result Value Ref Range   Color, Urine YELLOW YELLOW   APPearance CLOUDY (A) CLEAR   Specific Gravity, Urine 1.020 1.005 - 1.030   pH 6.0 5.0 - 8.0   Glucose, UA NEGATIVE NEGATIVE mg/dL   Hgb urine dipstick NEGATIVE NEGATIVE   Bilirubin Urine NEGATIVE NEGATIVE   Ketones, ur NEGATIVE NEGATIVE mg/dL   Protein, ur NEGATIVE NEGATIVE mg/dL   Nitrite NEGATIVE NEGATIVE   Leukocytes, UA NEGATIVE NEGATIVE    Comment: MICROSCOPIC NOT DONE ON URINES WITH NEGATIVE PROTEIN, BLOOD, LEUKOCYTES, NITRITE, OR GLUCOSE <1000 mg/dL.  Glucose, capillary     Status: None   Collection Time: 03/18/16 12:25 AM  Result Value Ref Range   Glucose-Capillary 66 65 - 99 mg/dL  Troponin I     Status: Abnormal   Collection Time: 03/18/16 12:32 AM  Result Value Ref Range   Troponin I 0.20 (H) <0.031 ng/mL    Comment:        PERSISTENTLY INCREASED TROPONIN VALUES IN THE RANGE OF 0.04-0.49 ng/mL CAN BE SEEN IN:       -UNSTABLE ANGINA       -CONGESTIVE HEART FAILURE       -MYOCARDITIS       -CHEST TRAUMA       -ARRYHTHMIAS       -LATE PRESENTING MYOCARDIAL INFARCTION       -COPD   CLINICAL FOLLOW-UP RECOMMENDED.   Troponin I     Status: Abnormal   Collection Time: 03/18/16  6:25 AM  Result Value Ref Range   Troponin I 0.18 (H) <0.031 ng/mL    Comment:        PERSISTENTLY INCREASED TROPONIN VALUES IN THE RANGE OF 0.04-0.49 ng/mL CAN BE SEEN IN:       -UNSTABLE ANGINA       -CONGESTIVE HEART FAILURE       -MYOCARDITIS       -CHEST TRAUMA       -ARRYHTHMIAS       -LATE PRESENTING MYOCARDIAL INFARCTION       -COPD   CLINICAL FOLLOW-UP RECOMMENDED.   Basic metabolic panel     Status: Abnormal   Collection Time: 03/18/16  6:25 AM  Result Value Ref Range   Sodium 139 135 - 145 mmol/L   Potassium 3.4 (L) 3.5 - 5.1 mmol/L   Chloride 105 101 - 111 mmol/L   CO2 24 22 - 32 mmol/L   Glucose, Bld 145 (H) 65 - 99 mg/dL   BUN 27 (H) 6 - 20 mg/dL    Creatinine, Ser 1.57 (H) 0.44 - 1.00 mg/dL   Calcium 10.2 8.9 - 10.3 mg/dL   GFR calc non Af Amer 31 (L) >60 mL/min   GFR calc Af Amer 36 (L) >60 mL/min    Comment: (NOTE) The eGFR has been calculated using the CKD EPI equation. This calculation has not been validated in all clinical situations. eGFR's persistently <60 mL/min signify possible Chronic Kidney Disease.    Anion gap 10 5 - 15  Magnesium     Status: None   Collection Time: 03/18/16  6:25 AM  Result Value Ref Range   Magnesium 1.7 1.7 - 2.4 mg/dL  Reticulocytes     Status: Abnormal   Collection Time: 03/18/16  6:25 AM  Result Value Ref Range   Retic Ct Pct 1.0  0.4 - 3.1 %   RBC. 3.69 (L) 3.87 - 5.11 MIL/uL   Retic Count, Manual 36.9 19.0 - 186.0 K/uL  TSH     Status: None   Collection Time: 03/18/16  6:26 AM  Result Value Ref Range   TSH 2.970 0.350 - 4.500 uIU/mL  Brain natriuretic peptide     Status: Abnormal   Collection Time: 03/18/16  6:26 AM  Result Value Ref Range   B Natriuretic Peptide 521.0 (H) 0.0 - 100.0 pg/mL  Lipid panel     Status: Abnormal   Collection Time: 03/18/16  6:26 AM  Result Value Ref Range   Cholesterol 171 0 - 200 mg/dL   Triglycerides 77 <150 mg/dL   HDL 56 >40 mg/dL   Total CHOL/HDL Ratio 3.1 RATIO   VLDL 15 0 - 40 mg/dL   LDL Cholesterol 100 (H) 0 - 99 mg/dL    Comment:        Total Cholesterol/HDL:CHD Risk Coronary Heart Disease Risk Table                     Men   Women  1/2 Average Risk   3.4   3.3  Average Risk       5.0   4.4  2 X Average Risk   9.6   7.1  3 X Average Risk  23.4   11.0        Use the calculated Patient Ratio above and the CHD Risk Table to determine the patient's CHD Risk.        ATP III CLASSIFICATION (LDL):  <100     mg/dL   Optimal  100-129  mg/dL   Near or Above                    Optimal  130-159  mg/dL   Borderline  160-189  mg/dL   High  >190     mg/dL   Very High Performed at [facility]   Vitamin B12     Status: None    Collection Time: 03/18/16  6:26 AM  Result Value Ref Range   Vitamin B-12 911 180 - 914 pg/mL    Comment: (NOTE) This assay is not validated for testing neonatal or myeloproliferative syndrome specimens for Vitamin B12 levels. Performed at [facility]   Folate     Status: None   Collection Time: 03/18/16  6:26 AM  Result Value Ref Range   Folate 40.0 >5.9 ng/mL    Comment: Performed at [facility]  Iron and TIBC     Status: Abnormal   Collection Time: 03/18/16  6:26 AM  Result Value Ref Range   Iron 55 28 - 170 ug/dL   TIBC 244 (L) 250 - 450 ug/dL   Saturation Ratios 23 10.4 - 31.8 %   UIBC 189 ug/dL    Comment: Performed at [facility]  Ferritin     Status: None   Collection Time: 03/18/16  6:26 AM  Result Value Ref Range   Ferritin 136 11 - 307 ng/mL    Comment: Performed at [facility]  Glucose, capillary     Status: Abnormal   Collection Time: 03/18/16  8:02 AM  Result Value Ref Range   Glucose-Capillary 132 (H) 65 - 99 mg/dL  Glucose, capillary     Status: Abnormal   Collection Time: 03/18/16  1:18 PM  Result Value Ref Range   Glucose-Capillary 147 (H) 65 -  99 mg/dL  Glucose, capillary     Status: Abnormal   Collection Time: 03/18/16  5:10 PM  Result Value Ref Range   Glucose-Capillary 137 (H) 65 - 99 mg/dL  Glucose, capillary     Status: Abnormal   Collection Time: 03/18/16  9:29 PM  Result Value Ref Range   Glucose-Capillary 156 (H) 65 - 99 mg/dL  Basic metabolic panel     Status: Abnormal   Collection Time: 03/19/16  5:19 AM  Result Value Ref Range   Sodium 137 135 - 145 mmol/L   Potassium 3.6 3.5 - 5.1 mmol/L   Chloride 101 101 - 111 mmol/L   CO2 26 22 - 32 mmol/L   Glucose, Bld 121 (H) 65 - 99 mg/dL   BUN 42 (H) 6 - 20 mg/dL   Creatinine, Ser 2.02 (H) 0.44 - 1.00 mg/dL   Calcium 9.7 8.9 - 10.3 mg/dL   GFR calc non Af Amer 23 (L) >60 mL/min   GFR calc Af Amer 27 (L) >60 mL/min    Comment: (NOTE) The eGFR has been  calculated using the CKD EPI equation. This calculation has not been validated in all clinical situations. eGFR's persistently <60 mL/min signify possible Chronic Kidney Disease.    Anion gap 10 5 - 15  Glucose, capillary     Status: Abnormal   Collection Time: 03/19/16  7:32 AM  Result Value Ref Range   Glucose-Capillary 124 (H) 65 - 99 mg/dL    Imaging: Dg Chest 2 View  03/17/2016  CLINICAL DATA:  Productive cough. EXAM: CHEST  2 VIEW COMPARISON:  No recent. FINDINGS: Cardiomegaly with normal pulmonary vascularity. No focal infiltrate. No pleural effusion or pneumothorax. No acute bony abnormality . IMPRESSION: Cardiomegaly. No overt congestive heart failure. Exam otherwise unremarkable. Electronically Signed   By: Marcello Moores  Register   On: 03/17/2016 16:35    Assessment:  Principal Problem:   Acute systolic congestive heart failure, NYHA class 3 (HCC) Active Problems:   Diabetes mellitus ([place])   Cardiomegaly   CKD (chronic kidney disease) stage 2, GFR 60-89 ml/min   Elevated troponin   Essential hypertension   DM type 2 causing CKD stage 2 (HCC)   Elevated troponin I level   Plan:  1. New acute systolic congestive heart failure, LVEF 35-40% - appears clinically euvolemic. Creatinine is rising. Hold lasix today, decrease captopril to 25 mg BID. Creatinine will not allow cath at this time, but would consider myoview tomorrow - this is likely non-ischemic cardiomyopathy - either due to hypertensive or infiltrative disease.  Time Spent Directly with Patient:  15 minutes  Length of Stay:    Pixie Casino, MD, [facility] Attending Cardiologist [place] 03/19/2016, 9:10 AM

## 2016-03-19 NOTE — Care Management Obs Status (Signed)
[place] NOTIFICATION   Patient Details  Name: Mandy Rhodes MRN: [id number] Date of Birth: Mar 19, 1941   Medicare Observation Status Notification Given:  Yes    Erenest Rasher, RN 03/19/2016, 10:18 AM

## 2016-03-19 NOTE — Care Management Note (Addendum)
Case Management Note  Patient Details  Name: Mandy Rhodes MRN: [id number] Date of Birth: 09/05/1941  Subjective/Objective:   CHF, AKI                 Action/Plan: Discharge Planning:  NCM spoke to pt at bedside. States she lives at home alone. She has RW at home. Pt may benefits from [facility] RN at dc to help manage disease process. [facility] referral for follow up for Disease Management.   PCP-  WHITE, CYNTHIA MD   1600 NCM spoke to pt and offered choice/provided [place] list. Pt agreeable to [facility]. Contacted [facility] Liaison with new referral. Faxed pt's new insurance info to admission. Pt has [employer].   Expected Discharge Date:  03/20/2016               Expected Discharge Plan:  Home/Self Care  In-House Referral:  NA  Discharge planning Services  CM Consult  Post Acute Care Choice:  NA Choice offered to:  NA  DME Arranged:  N/A DME Agency:  NA  HH Arranged:  NA HH Agency:  NA  Status of Service:  Completed, signed off  Medicare Important Message Given:    Date Medicare IM Given:    Medicare IM give by:    Date Additional Medicare IM Given:    Additional Medicare Important Message give by:     If discussed at [place] of Stay Meetings, dates discussed:    Additional Comments:  Erenest Rasher, RN 03/19/2016, 3:28 PM

## 2016-03-19 NOTE — Progress Notes (Signed)
Myoview stress test scheduled for 8am 5/1 per tech @ [facility]. [facility] notified and transport set up for 0730 on 5/1. Pt aware.

## 2016-03-20 ENCOUNTER — Ambulatory Visit (HOSPITAL_COMMUNITY)
Admit: 2016-03-20 | Discharge: 2016-03-20 | Disposition: A | Discharge status: Home / Self Care | Payer: Medicare HMO | Attending: Internal Medicine | Admitting: Internal Medicine

## 2016-03-20 ENCOUNTER — Other Ambulatory Visit: Payer: Self-pay

## 2016-03-20 ENCOUNTER — Ambulatory Visit (HOSPITAL_COMMUNITY)
Admission: RE | Admit: 2016-03-20 | Discharge: 2016-03-20 | Disposition: A | Discharge status: Home / Self Care | Payer: Medicare Other | Source: Ambulatory Visit | Attending: Internal Medicine | Admitting: Internal Medicine

## 2016-03-20 ENCOUNTER — Ambulatory Visit: Payer: Self-pay | Admitting: *Deleted

## 2016-03-20 DIAGNOSIS — I110 Hypertensive heart disease with heart failure: Secondary | ICD-10-CM | POA: Insufficient documentation

## 2016-03-20 DIAGNOSIS — R931 Abnormal findings on diagnostic imaging of heart and coronary circulation: Secondary | ICD-10-CM | POA: Diagnosis not present

## 2016-03-20 DIAGNOSIS — E119 Type 2 diabetes mellitus without complications: Secondary | ICD-10-CM | POA: Insufficient documentation

## 2016-03-20 DIAGNOSIS — N189 Chronic kidney disease, unspecified: Secondary | ICD-10-CM | POA: Insufficient documentation

## 2016-03-20 DIAGNOSIS — I509 Heart failure, unspecified: Secondary | ICD-10-CM

## 2016-03-20 DIAGNOSIS — I5021 Acute systolic (congestive) heart failure: Secondary | ICD-10-CM | POA: Diagnosis not present

## 2016-03-20 DIAGNOSIS — N183 Chronic kidney disease, stage 3 (moderate): Secondary | ICD-10-CM | POA: Diagnosis not present

## 2016-03-20 DIAGNOSIS — E1122 Type 2 diabetes mellitus with diabetic chronic kidney disease: Secondary | ICD-10-CM | POA: Diagnosis not present

## 2016-03-20 DIAGNOSIS — R7989 Other specified abnormal findings of blood chemistry: Secondary | ICD-10-CM | POA: Diagnosis not present

## 2016-03-20 DIAGNOSIS — I10 Essential (primary) hypertension: Secondary | ICD-10-CM | POA: Diagnosis not present

## 2016-03-20 DIAGNOSIS — I130 Hypertensive heart and chronic kidney disease with heart failure and stage 1 through stage 4 chronic kidney disease, or unspecified chronic kidney disease: Secondary | ICD-10-CM | POA: Diagnosis not present

## 2016-03-20 DIAGNOSIS — I517 Cardiomegaly: Secondary | ICD-10-CM | POA: Diagnosis not present

## 2016-03-20 LAB — GLUCOSE, CAPILLARY
GLUCOSE-CAPILLARY: 107 mg/dL — AB (ref 65–99)
GLUCOSE-CAPILLARY: 174 mg/dL — AB (ref 65–99)
GLUCOSE-CAPILLARY: 123 mg/dL — AB (ref 65–99)
Glucose-Capillary: 125 mg/dL — ABNORMAL HIGH (ref 65–99)

## 2016-03-20 LAB — NM MYOCAR MULTI W/SPECT W/WALL MOTION / EF
CHL CUP RESTING HR STRESS: 66 {beats}/min
CSEPED: 5 min
CSEPEDS: 0 s

## 2016-03-20 LAB — BASIC METABOLIC PANEL
Anion gap: 9 (ref 5–15)
BUN: 48 mg/dL — AB (ref 6–20)
CALCIUM: 9.6 mg/dL (ref 8.9–10.3)
CHLORIDE: 103 mmol/L (ref 101–111)
CO2: 27 mmol/L (ref 22–32)
CREATININE: 2.08 mg/dL — AB (ref 0.44–1.00)
GFR calc non Af Amer: 22 mL/min — ABNORMAL LOW (ref >60)
GFR, EST AFRICAN AMERICAN: 26 mL/min — AB (ref >60)
GLUCOSE: 111 mg/dL — AB (ref 65–99)
Potassium: 3.9 mmol/L (ref 3.5–5.1)
Sodium: 139 mmol/L (ref 135–145)

## 2016-03-20 LAB — CBC
HCT: 31 % — ABNORMAL LOW (ref 36.0–46.0)
Hemoglobin: 10.3 g/dL — ABNORMAL LOW (ref 12.0–15.0)
MCH: 28.5 pg (ref 26.0–34.0)
MCHC: 33.2 g/dL (ref 30.0–36.0)
MCV: 85.9 fL (ref 78.0–100.0)
PLATELETS: 159 10*3/uL (ref 150–400)
RBC: 3.61 MIL/uL — ABNORMAL LOW (ref 3.87–5.11)
RDW: 14.9 % (ref 11.5–15.5)
WBC: 4.5 10*3/uL (ref 4.0–10.5)

## 2016-03-20 LAB — HEMOGLOBIN A1C
Hgb A1c MFr Bld: 6.9 % — ABNORMAL HIGH (ref 4.8–5.6)
Mean Plasma Glucose: 151 mg/dL

## 2016-03-20 MED ORDER — REGADENOSON 0.4 MG/5ML IV SOLN
INTRAVENOUS | Status: AC
  Filled 2016-03-20: qty 5

## 2016-03-20 MED ORDER — REGADENOSON 0.4 MG/5ML IV SOLN: 0.4000 mg | Freq: Once | INTRAVENOUS | Status: DC

## 2016-03-20 MED ORDER — TECHNETIUM TC 99M SESTAMIBI - CARDIOLITE
30.0000 | Freq: Once | INTRAVENOUS | Status: AC | PRN
  Administered 2016-03-20: 30 via INTRAVENOUS

## 2016-03-20 MED ORDER — CAPTOPRIL 12.5 MG PO TABS
12.5000 mg | ORAL_TABLET | Freq: Two times a day (BID) | ORAL | Status: DC
  Administered 2016-03-20 – 2016-03-22 (×4): 12.5 mg via ORAL
  Filled 2016-03-20 (×7): qty 1

## 2016-03-20 MED ORDER — TECHNETIUM TC 99M SESTAMIBI GENERIC - CARDIOLITE
10.0000 | Freq: Once | INTRAVENOUS | Status: AC | PRN
  Administered 2016-03-20: 10 via INTRAVENOUS

## 2016-03-20 MED ORDER — REGADENOSON 0.4 MG/5ML IV SOLN
0.4000 mg | Freq: Once | INTRAVENOUS | Status: AC
  Administered 2016-03-20: 0.4 mg via INTRAVENOUS

## 2016-03-20 NOTE — Progress Notes (Addendum)
Patient Name: Mandy Rhodes Date of Encounter: 03/20/2016     Principal Problem:   Acute systolic congestive heart failure, NYHA class 3 (Mandy Rhodes) Active Problems:   Diabetes mellitus ([place])   Cardiomegaly   CKD (chronic kidney disease) stage 2, GFR 60-89 ml/min   Elevated troponin   Essential hypertension   DM type 2 causing CKD stage 2 (HCC)   Elevated troponin I level   Cough    SUBJECTIVE No SOB    CURRENT MEDS . aspirin EC  81 mg Oral Daily  . captopril  25 mg Oral BID  . carvedilol  3.125 mg Oral BID WC  . enoxaparin (LOVENOX) injection  40 mg Subcutaneous Q24H  . ferrous sulfate  650 mg Oral Daily  . insulin aspart  0-15 Units Subcutaneous TID WC  . insulin aspart  0-5 Units Subcutaneous QHS  . nortriptyline  10 mg Oral QHS  . pantoprazole  40 mg Oral Daily  . pravastatin  40 mg Oral q1800  . sodium chloride flush  3 mL Intravenous Q12H    OBJECTIVE  Filed Vitals:   03/19/16 1742 03/19/16 2010 03/19/16 2021 03/20/16 0445  BP: 100/43 132/64 97/54 106/50  Pulse: 67 75 65 59  Temp:  98.3 F (36.8 C) 97.6 F (36.4 C) 97.7 F (36.5 C)  TempSrc:  Oral Oral Oral  Resp: 18 20 20 20   Height:      Weight:    212 lb 8.4 oz (96.4 kg)  SpO2:  98% 100% 100%    Intake/Output Summary (Last 24 hours) at 03/20/16 0941 Last data filed at 03/20/16 0730  Gross per 24 hour  Intake    600 ml  Output   1151 ml  Net   -551 ml   Filed Weights   03/18/16 0020 03/19/16 0422 03/20/16 0445  Weight: 216 lb 4.3 oz (98.1 kg) 216 lb (97.977 kg) 212 lb 8.4 oz (96.4 kg)    PHYSICAL EXAM  General: Pleasant, NAD. Neuro: Alert and oriented X 3. Moves all extremities spontaneously. Psych: Normal affect. HEENT:  Normal  Neck: Supple without bruits or JVD. Lungs:  Resp regular and unlabored, CTA. Heart: RRR no s3, s4, or murmurs. Abdomen: Soft, non-tender, non-distended, BS + x 4.  Extremities: No clubbing, cyanosis or edema. DP/PT/Radials 2+ and equal  bilaterally.  Accessory Clinical Findings  CBC  Recent Labs  03/17/16 1818 03/20/16 0507  WBC 4.4 4.5  NEUTROABS 2.4  --   HGB 11.0* 10.3*  HCT 32.7* 31.0*  MCV 86.3 85.9  PLT 170 Q000111Q   Basic Metabolic Panel  Recent Labs  03/18/16 0625 03/19/16 0519 03/20/16 0507  NA 139 137 139  K 3.4* 3.6 3.9  CL 105 101 103  CO2 24 26 27   GLUCOSE 145* 121* 111*  BUN 27* 42* 48*  CREATININE 1.57* 2.02* 2.08*  CALCIUM 10.2 9.7 9.6  MG 1.7  --   --    Liver Function Tests  Recent Labs  03/17/16 1818  AST 21  ALT 20  ALKPHOS 76  BILITOT 0.6  PROT 7.2  ALBUMIN 4.0   No results for input(s): LIPASE, AMYLASE in the last 72 hours. Cardiac Enzymes  Recent Labs  03/17/16 1818 03/18/16 0032 03/18/16 0625  TROPONINI 0.18* 0.20* 0.18*   BNP Invalid input(s): POCBNP D-Dimer No results for input(s): DDIMER in the last 72 hours. Hemoglobin A1C No results for input(s): HGBA1C in the last 72 hours. Fasting Lipid Panel  Recent Labs  03/18/16  0626  CHOL 171  HDL 56  LDLCALC 100*  TRIG 77  CHOLHDL 3.1   Thyroid Function Tests  Recent Labs  03/18/16 0626  TSH 2.970    TELE NSR  Radiology/Studies  Dg Chest 2 View  03/17/2016  CLINICAL DATA:  Productive cough. EXAM: CHEST  2 VIEW COMPARISON:  No recent. FINDINGS: Cardiomegaly with normal pulmonary vascularity. No focal infiltrate. No pleural effusion or pneumothorax. No acute bony abnormality . IMPRESSION: Cardiomegaly. No overt congestive heart failure. Exam otherwise unremarkable. Electronically Signed   By: Marcello Moores  Register   On: 03/17/2016 16:35    2D ECHO: 03/18/2016 LV EF: 35% - 40% Study Conclusions - Left ventricle: The cavity size was normal. Wall thickness was  increased in a pattern of severe LVH. Bright-appearing  interatrial septum -consider infiltrative cardiomyopathy such as  amyloidosis. Systolic function was moderately reduced. The  estimated ejection fraction was in the range of 35% to  40%.  Diffuse hypokinesis. Doppler parameters are consistent with  abnormal left ventricular relaxation (grade 2 diastolic  dysfunction). The E/e&' ratio is >15, suggesting elevated LV  filling pressure. - Aortic valve: Trileaflet. Sclerosis without stenosis. There was  no regurgitation. - Mitral valve: Mildly thickened leaflets . There was trivial  regurgitation. - Left atrium: Moderately dilated at 44 ml/m2. - Right ventricle: The cavity size was mildly dilated. - Right atrium: The atrium was mildly dilated. - Inferior vena cava: The vessel was normal in size. The  respirophasic diameter changes were in the normal range (>= 50%),  consistent with normal central venous pressure. Impressions: - LVEF 35-40%, global hypokinesis, severe LVH with possible  infiltrative cardiomyopathy, Grade 2 diastolic dysfunction with  elevated LV filling pressure, moderate LAE, mild RAE, mild RVE,  normal IVC. Consider additional imaging test such a cMRI to  further evaluate LV wall thickening.   ASSESSMENT AND PLAN Mandy Rhodes is a 75 y.o. female with a history of IDDM, CKD 3, HTN, and anemia who presented to [facility] on 03/17/16 with cough and orthopnea. Her troponin was noted to be elevated and cardiology was consulted.   New acute combined S/D CHF:  -- Presented with cough and orthopnea. BNP mildly elevated and CXR with cardiomegaly without overt edema. -- 2D ECHO with newly reduced EF (35-40%), severe LVH with bright interstitial septum - ?infiltrative cardiomyopathy such as amyloid, Grade 2 DD, moderate LAE. -- Started on IV lasix, net neg 261mL. Weight down 4 lbs. Appears clinically euvolemic and creat rising, so lasix held and captopril decreased to 25mg  BID.  -- Due to CKD, proceeding with lexiscan myoview instead of cath. Likely NICM due to HTN or infiltrative disease -- Continue BB and ACE. Resume lasix when creat stabilizes.   Elevated troponin: c/w demand in the setting of acute  CHF. Myoview today to rule out ischemia .  Acute on CKD: creat up to 2.08. It was 1.57 on 03/18/16. Lasix currently held. ACE decreased.   HTN: BP normal to soft.   Judy Pimple PA-C  Pager [phone]  Patient seen and examined. Agree with assessment and plan. Patient denies recent chest pain. Mildly increased troponin levels with a plateau pattern suggestive of demand ischemia rather than acute coronary syndrome. Moderate LV dysfunction with grade 2 diastolic dysfunction on echocardiography with a bright appearing intraventricular septum, possibly suggestive of an infiltrated cardiomyopathy. She is for [employer] study to evaluate potential ischemic etiology to her LV dysfunction and symptoms. Creatinine today is increased to 2.08; will reduce  captopril dose to 12.5 mg and hold Lasix.   [name], MD, [facility] 03/20/2016 10:57 AM

## 2016-03-20 NOTE — Progress Notes (Deleted)
DAILY PROGRESS NOTE  Subjective:  No events overnight. Breathing better today. Net even I's/O's overnight. Appears clinically euvolemic. Echo showed a newly decreased LVEF of 35-40%, severe LVH with bright interstitial septum - ?infiltrative cardiomyopathy such as amyloid, Grade 2 DD, moderate LAE.   Objective:  Temp:  [97.6 F (36.4 C)-98.3 F (36.8 C)] 97.7 F (36.5 C) (05/01 0445) Pulse Rate:  [59-75] 59 (05/01 0445) Resp:  [18-20] 20 (05/01 0445) BP: (97-132)/(43-67) 113/67 mmHg (05/01 1020) SpO2:  [98 %-100 %] 100 % (05/01 0445) Weight:  [212 lb 8.4 oz (96.4 kg)] 212 lb 8.4 oz (96.4 kg) (05/01 0445) Weight change: -3 lb 7.6 oz (-1.577 kg)  Intake/Output from previous day: 04/30 0701 - 05/01 0700 In: 980 [P.O.:980] Out: 1201 [Urine:1200; Stool:1]  Intake/Output from this shift: Total I/O In: -  Out: 200 [Urine:200]  Medications: Current Facility-Administered Medications  Medication Dose Route Frequency Provider Last Rate Last Dose  . 0.9 %  sodium chloride infusion  250 mL Intravenous PRN Toy Baker, MD      . acetaminophen (TYLENOL) tablet 650 mg  650 mg Oral Q4H PRN Toy Baker, MD   650 mg at 03/18/16 2027  . aspirin EC tablet 81 mg  81 mg Oral Daily Toy Baker, MD   81 mg at 03/19/16 0930  . captopril (CAPOTEN) tablet 25 mg  25 mg Oral BID Pixie Casino, MD   25 mg at 03/19/16 0930  . carvedilol (COREG) tablet 3.125 mg  3.125 mg Oral BID WC Toy Baker, MD   3.125 mg at 03/19/16 1744  . enoxaparin (LOVENOX) injection 40 mg  40 mg Subcutaneous Q24H Toy Baker, MD   40 mg at 03/19/16 0749  . ferrous sulfate tablet 650 mg  650 mg Oral Daily Toy Baker, MD   650 mg at 03/19/16 0930  . insulin aspart (novoLOG) injection 0-15 Units  0-15 Units Subcutaneous TID WC Toy Baker, MD   5 Units at 03/19/16 1744  . insulin aspart (novoLOG) injection 0-5 Units  0-5 Units Subcutaneous QHS Toy Baker, MD   0 Units at  03/18/16 0040  . nortriptyline (PAMELOR) capsule 10 mg  10 mg Oral QHS Toy Baker, MD   10 mg at 03/19/16 2233  . ondansetron (ZOFRAN) injection 4 mg  4 mg Intravenous Q6H PRN Toy Baker, MD      . pantoprazole (PROTONIX) EC tablet 40 mg  40 mg Oral Daily Toy Baker, MD   40 mg at 03/19/16 0930  . pravastatin (PRAVACHOL) tablet 40 mg  40 mg Oral q1800 Toy Baker, MD   40 mg at 03/19/16 1746  . sodium chloride flush (NS) 0.9 % injection 3 mL  3 mL Intravenous Q12H Toy Baker, MD   3 mL at 03/19/16 2233  . sodium chloride flush (NS) 0.9 % injection 3 mL  3 mL Intravenous PRN Toy Baker, MD      . traMADol (ULTRAM) tablet 50 mg  50 mg Oral Q6H PRN Toy Baker, MD       Facility-Administered Medications Ordered in Other Encounters  Medication Dose Route Frequency Provider Last Rate Last Dose  . regadenoson (LEXISCAN) 0.4 MG/5ML injection SOLN           . regadenoson (LEXISCAN) injection SOLN 0.4 mg  0.4 mg Intravenous Once Erlene Quan, [place]        Physical Exam: General appearance: alert and no distress Lungs: clear to auscultation bilaterally Heart: regular rate and rhythm, S1, S2  normal, no murmur, click, rub or gallop Extremities: edema trace pedal Neurologic: Grossly normal  Lab Results: Results for orders placed or performed during the hospital encounter of 03/17/16 (from the past 48 hour(s))  Glucose, capillary     Status: Abnormal   Collection Time: 03/18/16  1:18 PM  Result Value Ref Range   Glucose-Capillary 147 (H) 65 - 99 mg/dL  Glucose, capillary     Status: Abnormal   Collection Time: 03/18/16  5:10 PM  Result Value Ref Range   Glucose-Capillary 137 (H) 65 - 99 mg/dL  Glucose, capillary     Status: Abnormal   Collection Time: 03/18/16  9:29 PM  Result Value Ref Range   Glucose-Capillary 156 (H) 65 - 99 mg/dL  Basic metabolic panel     Status: Abnormal   Collection Time: 03/19/16  5:19 AM  Result Value Ref Range    Sodium 137 135 - 145 mmol/L   Potassium 3.6 3.5 - 5.1 mmol/L   Chloride 101 101 - 111 mmol/L   CO2 26 22 - 32 mmol/L   Glucose, Bld 121 (H) 65 - 99 mg/dL   BUN 42 (H) 6 - 20 mg/dL   Creatinine, Ser 2.02 (H) 0.44 - 1.00 mg/dL   Calcium 9.7 8.9 - 10.3 mg/dL   GFR calc non Af Amer 23 (L) >60 mL/min   GFR calc Af Amer 27 (L) >60 mL/min    Comment: (NOTE) The eGFR has been calculated using the CKD EPI equation. This calculation has not been validated in all clinical situations. eGFR's persistently <60 mL/min signify possible Chronic Kidney Disease.    Anion gap 10 5 - 15  Glucose, capillary     Status: Abnormal   Collection Time: 03/19/16  7:32 AM  Result Value Ref Range   Glucose-Capillary 124 (H) 65 - 99 mg/dL  Glucose, capillary     Status: Abnormal   Collection Time: 03/19/16 11:52 AM  Result Value Ref Range   Glucose-Capillary 151 (H) 65 - 99 mg/dL  Glucose, capillary     Status: Abnormal   Collection Time: 03/19/16  5:30 PM  Result Value Ref Range   Glucose-Capillary 218 (H) 65 - 99 mg/dL  Glucose, capillary     Status: Abnormal   Collection Time: 03/19/16  8:19 PM  Result Value Ref Range   Glucose-Capillary 128 (H) 65 - 99 mg/dL   Comment 1 Notify RN    Comment 2 Document in Chart   Glucose, capillary     Status: Abnormal   Collection Time: 03/20/16  4:41 AM  Result Value Ref Range   Glucose-Capillary 107 (H) 65 - 99 mg/dL   Comment 1 Notify RN    Comment 2 Document in Chart   Basic metabolic panel     Status: Abnormal   Collection Time: 03/20/16  5:07 AM  Result Value Ref Range   Sodium 139 135 - 145 mmol/L   Potassium 3.9 3.5 - 5.1 mmol/L   Chloride 103 101 - 111 mmol/L   CO2 27 22 - 32 mmol/L   Glucose, Bld 111 (H) 65 - 99 mg/dL   BUN 48 (H) 6 - 20 mg/dL   Creatinine, Ser 2.08 (H) 0.44 - 1.00 mg/dL   Calcium 9.6 8.9 - 10.3 mg/dL   GFR calc non Af Amer 22 (L) >60 mL/min   GFR calc Af Amer 26 (L) >60 mL/min    Comment: (NOTE) The eGFR has been calculated  using the CKD EPI equation. This calculation has  not been validated in all clinical situations. eGFR's persistently <60 mL/min signify possible Chronic Kidney Disease.    Anion gap 9 5 - 15  CBC     Status: Abnormal   Collection Time: 03/20/16  5:07 AM  Result Value Ref Range   WBC 4.5 4.0 - 10.5 K/uL   RBC 3.61 (L) 3.87 - 5.11 MIL/uL   Hemoglobin 10.3 (L) 12.0 - 15.0 g/dL   HCT 31.0 (L) 36.0 - 46.0 %   MCV 85.9 78.0 - 100.0 fL   MCH 28.5 26.0 - 34.0 pg   MCHC 33.2 30.0 - 36.0 g/dL   RDW 14.9 11.5 - 15.5 %   Platelets 159 150 - 400 K/uL    Imaging: No results found.  Assessment:  Principal Problem:   Acute systolic congestive heart failure, NYHA class 3 (HCC) Active Problems:   Diabetes mellitus (HCC)   Cardiomegaly   CKD (chronic kidney disease) stage 2, GFR 60-89 ml/min   Elevated troponin   Essential hypertension   DM type 2 causing CKD stage 2 (HCC)   Elevated troponin I level   Cough   Plan:  1. New acute systolic congestive heart failure, LVEF 35-40% - appears clinically euvolemic. Creatinine is rising. Hold lasix today, decrease captopril to 25 mg BID. Creatinine will not allow cath at this time, - this is likely non-ischemic cardiomyopathy - either due to hypertensive or infiltrative disease. Myoview today  Time Spent Directly with Patient:  15 minutes    Lear Carstens K PA 03/20/2016, 10:30 AM

## 2016-03-20 NOTE — Progress Notes (Signed)
PROGRESS NOTE    Mandy Rhodes  [id number] DOB: [birth year] DOA: 03/17/2016 PCP: Vidal Schwalbe, MD  Outpatient Specialists:   Brief Narrative: Mandy Rhodes is an 75 y.o. female past medical history of diabetes mellitus chronic kidney disease stage 3, with a baseline creatinine of 1.6-2.0, hypertension who comes to the ED complaining of cough and cold symptoms 5 days prior to admission.  Assessment & Plan   Acute systolic CHF -Echocardiogram shows EF 35-40% -Patient was on IV Lasix, however her creatinine has been rising -Cardiology consulted and appreciated -Given her increasing creatinine, Lasix held today. Catapres decrease to 25 mg twice a day -Cardiology feels this may be due to hypertensive or infiltrative disease -Continue to monitor intake and output, daily weights -Currently appears to be euvolemic -Pending myoview today   Acute on chronic kidney disease, Stage III -Reviewing records from 2012, patient was stage IV at one point in time However improved in 2016 to stage III. -Patient currently diuresing given systolic CHF -Lasix held -Creatinine today 2.08 -Continue to monitor BMP -Cardiology will decrease captopril to 12.5mg   Elevated troponin -Her cardiac biomarkers have remained flat there is likely due to chronic kidney disease. -Myoview pending  Essential hypertension: -Catapril decreased to 12.5 mg twice a day, Lasix held today -BP on the soft side.  DM type 2 causing CKD stage 2 ([place]): -Continue insulin sliding scale CBG monitoring -Hemoglobin A1c pending  DVT Prophylaxis  Lovenox  Code Status: DO NOT RESUSCITATE  Family Communication: None at bedside  Disposition Plan: Admitted, pending Myoview and improvement in creatinine as well as heart failure  Consultants Cardiology  Procedures  Echocardiogram  Antibiotics   Anti-infectives    None      Subjective:   Mandy Rhodes seen and examined today.  States she is feeling better.  Denies chest pain, shortness of breath, abdominal pain, N/V.  Would like to know the results of her test this morning   Objective:   Filed Vitals:   03/19/16 1742 03/19/16 2010 03/19/16 2021 03/20/16 0445  BP: 100/43 132/64 97/54 106/50  Pulse: 67 75 65 59  Temp:  98.3 F (36.8 C) 97.6 F (36.4 C) 97.7 F (36.5 C)  TempSrc:  Oral Oral Oral  Resp: 18 20 20 20   Height:      Weight:    96.4 kg (212 lb 8.4 oz)  SpO2:  98% 100% 100%    Intake/Output Summary (Last 24 hours) at 03/20/16 1034 Last data filed at 03/20/16 0730  Gross per 24 hour  Intake    600 ml  Output   1151 ml  Net   -551 ml   Filed Weights   03/18/16 0020 03/19/16 0422 03/20/16 0445  Weight: 98.1 kg (216 lb 4.3 oz) 97.977 kg (216 lb) 96.4 kg (212 lb 8.4 oz)    Exam  General: Well developed, well nourished, NAD  HEENT: NCAT, mucous membranes moist.   Cardiovascular: S1 S2 auscultated, No murmurs, RRR  Respiratory: Clear to auscultation bilaterally   Abdomen: Soft, nontender, nondistended, + bowel sounds  Extremities: warm dry without cyanosis clubbing. Trace LE edema  Neuro: AAOx3, nonfocal  Psych: Normal affect and demeanor, pleasant  Data Reviewed: I have personally reviewed following labs and imaging studies  CBC:  Recent Labs Lab 03/17/16 1818 03/20/16 0507  WBC 4.4 4.5  NEUTROABS 2.4  --   HGB 11.0* 10.3*  HCT 32.7* 31.0*  MCV 86.3 85.9  PLT 170 Q000111Q   Basic Metabolic Panel:  Recent  Labs Lab 03/17/16 1818 03/18/16 0625 03/19/16 0519 03/20/16 0507  NA 139 139 137 139  K 4.0 3.4* 3.6 3.9  CL 104 105 101 103  CO2 25 24 26 27   GLUCOSE 121* 145* 121* 111*  BUN 27* 27* 42* 48*  CREATININE 1.68* 1.57* 2.02* 2.08*  CALCIUM 10.2 10.2 9.7 9.6  MG  --  1.7  --   --    GFR: Estimated Creatinine Clearance: 27.8 mL/min (by C-G formula based on Cr of 2.08). Liver Function Tests:  Recent Labs Lab 03/17/16 1818  AST 21  ALT 20  ALKPHOS 76  BILITOT 0.6  PROT 7.2  ALBUMIN 4.0     No results for input(s): LIPASE, AMYLASE in the last 168 hours. No results for input(s): AMMONIA in the last 168 hours. Coagulation Profile: No results for input(s): INR, PROTIME in the last 168 hours. Cardiac Enzymes:  Recent Labs Lab 03/17/16 1818 03/18/16 0032 03/18/16 0625  TROPONINI 0.18* 0.20* 0.18*   BNP (last 3 results) No results for input(s): PROBNP in the last 8760 hours. HbA1C: No results for input(s): HGBA1C in the last 72 hours. CBG:  Recent Labs Lab 03/19/16 0732 03/19/16 1152 03/19/16 1730 03/19/16 2019 03/20/16 0441  GLUCAP 124* 151* 218* 128* 107*   Lipid Profile:  Recent Labs  03/18/16 0626  CHOL 171  HDL 56  LDLCALC 100*  TRIG 77  CHOLHDL 3.1   Thyroid Function Tests:  Recent Labs  03/18/16 0626  TSH 2.970   Anemia Panel:  Recent Labs  03/18/16 0625 03/18/16 0626  VITAMINB12  --  911  FOLATE  --  40.0  FERRITIN  --  136  TIBC  --  244*  IRON  --  55  RETICCTPCT 1.0  --    Urine analysis:    Component Value Date/Time   COLORURINE YELLOW 03/17/2016 2234   APPEARANCEUR CLOUDY* 03/17/2016 2234   LABSPEC 1.020 03/17/2016 2234   LABSPEC 1.020 12/22/2010 1142   PHURINE 6.0 03/17/2016 2234   PHURINE 5.0 12/22/2010 1142   GLUCOSEU NEGATIVE 03/17/2016 2234   HGBUR NEGATIVE 03/17/2016 2234   HGBUR Negative 12/22/2010 1142   BILIRUBINUR NEGATIVE 03/17/2016 2234   BILIRUBINUR Negative 12/22/2010 1142   KETONESUR NEGATIVE 03/17/2016 2234   KETONESUR Negative 12/22/2010 1142   PROTEINUR NEGATIVE 03/17/2016 2234   PROTEINUR Negative 12/22/2010 1142   UROBILINOGEN 0.2 07/15/2015 1349   NITRITE NEGATIVE 03/17/2016 2234   NITRITE Negative 12/22/2010 1142   LEUKOCYTESUR NEGATIVE 03/17/2016 2234   LEUKOCYTESUR Negative 12/22/2010 1142   Sepsis Labs: @LABRCNTIP (procalcitonin:4,lacticidven:4)  )No results found for this or any previous visit (from the past 240 hour(s)).    Radiology Studies: No results found.   Scheduled  Meds: . aspirin EC  81 mg Oral Daily  . captopril  25 mg Oral BID  . carvedilol  3.125 mg Oral BID WC  . enoxaparin (LOVENOX) injection  40 mg Subcutaneous Q24H  . ferrous sulfate  650 mg Oral Daily  . insulin aspart  0-15 Units Subcutaneous TID WC  . insulin aspart  0-5 Units Subcutaneous QHS  . nortriptyline  10 mg Oral QHS  . pantoprazole  40 mg Oral Daily  . pravastatin  40 mg Oral q1800  . sodium chloride flush  3 mL Intravenous Q12H   Continuous Infusions:      Time Spent in minutes   30 minutes  Mattelyn Imhoff D.O. on 03/20/2016 at 10:34 AM  Between 7am to 7pm - Pager - [phone]  After 7pm go to [email or web address] - password [email or web address]  And look for the night coverage person covering for me after hours  [facility] Office  [phone]

## 2016-03-21 ENCOUNTER — Other Ambulatory Visit: Payer: Self-pay | Admitting: *Deleted

## 2016-03-21 DIAGNOSIS — I5021 Acute systolic (congestive) heart failure: Secondary | ICD-10-CM | POA: Diagnosis not present

## 2016-03-21 DIAGNOSIS — N183 Chronic kidney disease, stage 3 (moderate): Secondary | ICD-10-CM | POA: Diagnosis not present

## 2016-03-21 DIAGNOSIS — R931 Abnormal findings on diagnostic imaging of heart and coronary circulation: Secondary | ICD-10-CM | POA: Diagnosis not present

## 2016-03-21 DIAGNOSIS — I10 Essential (primary) hypertension: Secondary | ICD-10-CM | POA: Diagnosis not present

## 2016-03-21 LAB — CBC
HCT: 33.1 % — ABNORMAL LOW (ref 36.0–46.0)
HEMOGLOBIN: 11.1 g/dL — AB (ref 12.0–15.0)
MCH: 29.3 pg (ref 26.0–34.0)
MCHC: 33.5 g/dL (ref 30.0–36.0)
MCV: 87.3 fL (ref 78.0–100.0)
Platelets: 178 10*3/uL (ref 150–400)
RBC: 3.79 MIL/uL — AB (ref 3.87–5.11)
RDW: 15.1 % (ref 11.5–15.5)
WBC: 4.1 10*3/uL (ref 4.0–10.5)

## 2016-03-21 LAB — BASIC METABOLIC PANEL
ANION GAP: 7 (ref 5–15)
BUN: 49 mg/dL — ABNORMAL HIGH (ref 6–20)
CHLORIDE: 106 mmol/L (ref 101–111)
CO2: 28 mmol/L (ref 22–32)
Calcium: 9.8 mg/dL (ref 8.9–10.3)
Creatinine, Ser: 1.8 mg/dL — ABNORMAL HIGH (ref 0.44–1.00)
GFR calc non Af Amer: 27 mL/min — ABNORMAL LOW (ref >60)
GFR, EST AFRICAN AMERICAN: 31 mL/min — AB (ref >60)
Glucose, Bld: 122 mg/dL — ABNORMAL HIGH (ref 65–99)
Potassium: 4 mmol/L (ref 3.5–5.1)
Sodium: 141 mmol/L (ref 135–145)

## 2016-03-21 LAB — GLUCOSE, CAPILLARY
GLUCOSE-CAPILLARY: 108 mg/dL — AB (ref 65–99)
GLUCOSE-CAPILLARY: 182 mg/dL — AB (ref 65–99)
Glucose-Capillary: 109 mg/dL — ABNORMAL HIGH (ref 65–99)
Glucose-Capillary: 144 mg/dL — ABNORMAL HIGH (ref 65–99)

## 2016-03-21 MED ORDER — SODIUM CHLORIDE 0.9 % IV SOLN
INTRAVENOUS | Status: DC
  Administered 2016-03-21: 18:00:00 via INTRAVENOUS

## 2016-03-21 NOTE — Progress Notes (Addendum)
Pt  Refuses to take ASA, she said it messed up her stomach in the past.

## 2016-03-21 NOTE — Consult Note (Addendum)
   [facility] CM Inpatient Consult   03/21/2016  Mandy Rhodes Jan 22, 1941 [id number]     [facility] Care Management follow up visit made with patient. Patient evaluated for long-term disease management services with [place] Management program. Martin Majestic to bedside to discuss and offer [place] Management services. Patient is agreeable and written consent signed. Explained to patient that she will receive post hospital transition of care calls and will be evaluated for monthly home visits. Confirmed Primary Care MD as Dr. Dema Severin.  Confirmed best contact number as [phone]. Explained that [place] Management will not interfere or replace services provided by home health. Patient to have home health services as well.  Left [facility] Care Management packet and contact information at bedside. Will make inpatient RNCM aware that patient will be followed by [place] Management post hospital discharge.   [name] endorses she lives alone. She uses [employer] for transportation. She states she attempted to enroll for Mobile Meal services. However, [name] states she was told that she did not qualify because she was able to still cook. Discussed that maybe [facility] could possibly assist with with this. [name] denies having issues with affording medications.   Will request for patient to be assigned for CHF and DM teaching. [facility] Licensed CSW for mobile meals program assistance.     Marthenia Rolling, MSN-Ed, RN,BSN [facility] Liaison [phone].

## 2016-03-21 NOTE — Progress Notes (Addendum)
Patient Name: Mandy Rhodes Date of Encounter: 03/21/2016     Principal Problem:   Acute systolic congestive heart failure, NYHA class 3 ([place]) Active Problems:   Diabetes mellitus ([place])   Cardiomegaly   CKD (chronic kidney disease) stage 2, GFR 60-89 ml/min   Elevated troponin   Essential hypertension   DM type 2 causing CKD stage 2 (HCC)   Elevated troponin I level   Cough   CHF (congestive heart failure) (HCC)   CKD (chronic kidney disease)    SUBJECTIVE No SOB  No CP, breathing back to baseline. Pt says she had a heart cath over 10 years ago in [place] that did have a blockage but nothing to fix  CURRENT MEDS . aspirin EC  81 mg Oral Daily  . captopril  12.5 mg Oral BID  . carvedilol  3.125 mg Oral BID WC  . enoxaparin (LOVENOX) injection  40 mg Subcutaneous Q24H  . ferrous sulfate  650 mg Oral Daily  . insulin aspart  0-15 Units Subcutaneous TID WC  . insulin aspart  0-5 Units Subcutaneous QHS  . nortriptyline  10 mg Oral QHS  . pantoprazole  40 mg Oral Daily  . pravastatin  40 mg Oral q1800  . sodium chloride flush  3 mL Intravenous Q12H    OBJECTIVE  Filed Vitals:   03/20/16 0445 03/20/16 1430 03/20/16 2108 03/21/16 0500  BP: 106/50 114/63 110/62 94/73  Pulse: 59 78 61 68  Temp: 97.7 F (36.5 C) 97.5 F (36.4 C) 98 F (36.7 C) 98.2 F (36.8 C)  TempSrc: Oral Oral Oral Oral  Resp: 20 20 20 18   Height:      Weight: 212 lb 8.4 oz (96.4 kg)   214 lb 11.7 oz (97.4 kg)  SpO2: 100% 100% 98% 100%    Intake/Output Summary (Last 24 hours) at 03/21/16 0646 Last data filed at 03/21/16 0311  Gross per 24 hour  Intake    240 ml  Output   1150 ml  Net   -910 ml   Filed Weights   03/19/16 0422 03/20/16 0445 03/21/16 0500  Weight: 216 lb (97.977 kg) 212 lb 8.4 oz (96.4 kg) 214 lb 11.7 oz (97.4 kg)    PHYSICAL EXAM  General: Pleasant, NAD. Neuro: Alert and oriented X 3. Moves all extremities spontaneously. Psych: Normal affect. HEENT:  Normal  Neck:  Supple without bruits or JVD. Lungs:  Resp regular and unlabored, CTA. Heart: RRR no s3, s4, or murmurs. Abdomen: Soft, non-tender, non-distended, BS + x 4.  Extremities: No clubbing, cyanosis or edema. DP/PT/Radials 2+ and equal bilaterally.  Accessory Clinical Findings  CBC  Recent Labs  03/20/16 0507 03/21/16 0525  WBC 4.5 4.1  HGB 10.3* 11.1*  HCT 31.0* 33.1*  MCV 85.9 87.3  PLT 159 0000000   Basic Metabolic Panel  Recent Labs  03/20/16 0507 03/21/16 0525  NA 139 141  K 3.9 4.0  CL 103 106  CO2 27 28  GLUCOSE 111* 122*  BUN 48* 49*  CREATININE 2.08* 1.80*  CALCIUM 9.6 9.8    TELE NSR w/ PVCs  Radiology/Studies  Dg Chest 2 View  03/17/2016  CLINICAL DATA:  Productive cough. EXAM: CHEST  2 VIEW COMPARISON:  No recent. FINDINGS: Cardiomegaly with normal pulmonary vascularity. No focal infiltrate. No pleural effusion or pneumothorax. No acute bony abnormality . IMPRESSION: Cardiomegaly. No overt congestive heart failure. Exam otherwise unremarkable. Electronically Signed   By: Marcello Moores  Register   On: 03/17/2016 16:35  Nm Myocar Multi W/spect W/wall Motion / Ef  03/20/2016  CLINICAL DATA:  CHF, hypertension, diabetes EXAM: MYOCARDIAL IMAGING WITH SPECT (REST AND PHARMACOLOGIC-STRESS) GATED LEFT VENTRICULAR WALL MOTION STUDY LEFT VENTRICULAR EJECTION FRACTION TECHNIQUE: Standard myocardial SPECT imaging was performed after resting intravenous injection of 10 mCi Tc-22m sestamibi. Subsequently, intravenous infusion of Lexiscan was performed under the supervision of the Cardiology staff. At peak effect of the drug, 30 mCi Tc-62m sestamibi was injected intravenously and standard myocardial SPECT imaging was performed. Quantitative gated imaging was also performed to evaluate left ventricular wall motion, and estimate left ventricular ejection fraction. COMPARISON:  None. FINDINGS: Perfusion: There is decreased activity noted inferiorly on rest images, worsening on stress images. No  other areas of reversibility. Wall Motion: Decreased wall motion noted inferiorly. This segment does thickened. Mild generalized hypokinesia. Left Ventricular Ejection Fraction: 35 % End diastolic volume 0000000 ml End systolic volume 123XX123 ml IMPRESSION: 1. Decreased activity inferiorly on rest images, which worsens on stress images. This is concerning for back from I continue a shin with superimposed inducible ischemia. 2. Mild generalized hypokinesia. More pronounced decreased wall abnormality motion inferiorly. 3. Left ventricular ejection fraction 35% 4. High-risk stress test findings*. *2012 Appropriate Use Criteria for Coronary Revascularization Focused Update: J Am Coll Cardiol. [id number]. http://content.[email or web address].aspx?articleid=[email or web address] Electronically Signed   By: Rolm Baptise M.D.   On: 03/20/2016 12:58    2D ECHO: 03/18/2016 LV EF: 35% - 40% Study Conclusions - Left ventricle: The cavity size was normal. Wall thickness was  increased in a pattern of severe LVH. Bright-appearing  interatrial septum -consider infiltrative cardiomyopathy such as  amyloidosis. Systolic function was moderately reduced. The  estimated ejection fraction was in the range of 35% to 40%.  Diffuse hypokinesis. Doppler parameters are consistent with  abnormal left ventricular relaxation (grade 2 diastolic  dysfunction). The E/e&' ratio is >15, suggesting elevated LV  filling pressure. - Aortic valve: Trileaflet. Sclerosis without stenosis. There was  no regurgitation. - Mitral valve: Mildly thickened leaflets . There was trivial  regurgitation. - Left atrium: Moderately dilated at 44 ml/m2. - Right ventricle: The cavity size was mildly dilated. - Right atrium: The atrium was mildly dilated. - Inferior vena cava: The vessel was normal in size. The  respirophasic diameter changes were in the normal range (>= 50%),  consistent with normal central venous pressure. Impressions: - LVEF  35-40%, global hypokinesis, severe LVH with possible  infiltrative cardiomyopathy, Grade 2 diastolic dysfunction with  elevated LV filling pressure, moderate LAE, mild RAE, mild RVE,  normal IVC. Consider additional imaging test such a cMRI to  further evaluate LV wall thickening.   ASSESSMENT AND PLAN Mandy Rhodes is a 75 y.o. female with a history of IDDM, CKD 3, HTN, and anemia who presented to [facility] on 03/17/16 with cough and orthopnea. Her troponin was noted to be elevated and cardiology was consulted.   New acute combined S/D CHF:  -- Presented with cough and orthopnea. BNP mildly elevated and CXR with cardiomegaly without overt edema. -- 2D ECHO with newly reduced EF (35-40%), severe LVH with bright interstitial septum - ?infiltrative cardiomyopathy such as amyloid, Grade 2 DD, moderate LAE. -- Started on IV lasix, net neg 95mL. Weight down 2 lbs. Appears clinically euvolemic and creat rising, so lasix held and captopril decreased to 12.5 mg BID. Can likely start on PO lasix today if we are deciding against cath.  -- Due to CKD, she underwent lexiscan myoview instead of cath which showed possible inducible  ischemia -- Continue BB and ACE.   Elevated troponin: c/w demand in the setting of acute CHF. Myoview showed possible inducible ischemia. Will defer to Dr. Claiborne Billings next steps in work up. Of note, pt says she had a heart cath over 10 years ago in [place] that did have a blockage but nothing to fix  Acute on CKD: creat up to 2.08. It was 1.57 on 03/18/16. Lasix currently held. ACE decreased. Creat improved to 1.8 today.  HTN: BP normal to soft.   Judy Pimple PA-C  Pager [phone]  Patient seen and examined. Agree with assessment and plan. No chest pain. Nuclear study revealed EF 35% with mild generalized hypokinesis, more pronounced inferiorly with suggestion of mild inferior ischemia. High risk because of EF. I discussed possible cath with patient. She wants to  further discuss with daughter. Cr better but still elevated. She had mild CAD at cath > 10 yrs ago.  Will start gentle hydration at 30 cc/hr. Keep NPO in am for potential cath tomorrow or next day if renal fxn is better   [name], MD, [facility] 03/21/2016 2:15 PM

## 2016-03-21 NOTE — Progress Notes (Signed)
PROGRESS NOTE    Mandy Rhodes  [id number] DOB: 1941-07-17 DOA: 03/17/2016 PCP: Vidal Schwalbe, MD  Outpatient Specialists:   Brief Narrative: Mandy Rhodes is an 75 y.o. female past medical history of diabetes mellitus chronic kidney disease stage 3, with a baseline creatinine of 1.6-2.0, hypertension who comes to the ED complaining of cough and cold symptoms 5 days prior to admission.  Assessment & Plan   Acute systolic CHF -Echocardiogram shows EF 35-40% -Patient was on IV Lasix, however her creatinine has been rising -Cardiology consulted and appreciated -Given her increasing creatinine, Lasix held today. Catapres decrease to 12.5 mg twice a day -Cardiology feels this may be due to hypertensive or infiltrative disease -Continue to monitor intake and output, daily weights -Currently appears to be euvolemic -Myoview listed below   Acute on chronic kidney disease, Stage III -Reviewing records from 2012, patient was stage IV at one point in time However improved in 2016 to stage III. -Lasix held -Creatinine today 1.8 -Continue to monitor BMP -Continue captopril to 12.5mg   Elevated troponin -Her cardiac biomarkers have remained flat there is likely due to chronic kidney disease. -Myoview:  EF 35%, mild generalized hypokinesia, high risk stress test findings -Pending further recommendations from cardiology  Essential hypertension: -Catapril decreased to 12.5 mg twice a day, Lasix held today -BP on the soft side.  DM type 2 causing CKD stage 2 ([place]): -Continue insulin sliding scale CBG monitoring -Hemoglobin A1c 6.9  DVT Prophylaxis  Lovenox  Code Status: DO NOT RESUSCITATE  Family Communication: None at bedside  Disposition Plan: Admitted, pending further recommendations from cardiology  Consultants Cardiology  Procedures  Echocardiogram Myoview  Antibiotics   Anti-infectives    None      Subjective:   Mandy Rhodes seen and examined today.   Has no complaints today. Feels better overall.  "Would have liked to received good news." Currently denies chest pain, shortness of breath, abdominal pain, N/V.   Objective:   Filed Vitals:   03/20/16 0445 03/20/16 1430 03/20/16 2108 03/21/16 0500  BP: 106/50 114/63 110/62 94/73  Pulse: 59 78 61 68  Temp: 97.7 F (36.5 C) 97.5 F (36.4 C) 98 F (36.7 C) 98.2 F (36.8 C)  TempSrc: Oral Oral Oral Oral  Resp: 20 20 20 18   Height:      Weight: 96.4 kg (212 lb 8.4 oz)   97.4 kg (214 lb 11.7 oz)  SpO2: 100% 100% 98% 100%    Intake/Output Summary (Last 24 hours) at 03/21/16 1326 Last data filed at 03/21/16 1300  Gross per 24 hour  Intake    240 ml  Output   1250 ml  Net  -1010 ml   Filed Weights   03/19/16 0422 03/20/16 0445 03/21/16 0500  Weight: 97.977 kg (216 lb) 96.4 kg (212 lb 8.4 oz) 97.4 kg (214 lb 11.7 oz)    Exam  General: Well developed, well nourished, no distress  HEENT: NCAT, mucous membranes moist.   Cardiovascular: S1 S2 auscultated, No murmurs, RRR  Respiratory: Clear to auscultation bilaterally   Abdomen: Soft, nontender, nondistended, + bowel sounds  Extremities: warm dry without cyanosis clubbing.  Neuro: AAOx3, nonfocal  Psych: Normal affect and demeanor, pleasant  Data Reviewed: I have personally reviewed following labs and imaging studies  CBC:  Recent Labs Lab 03/17/16 1818 03/20/16 0507 03/21/16 0525  WBC 4.4 4.5 4.1  NEUTROABS 2.4  --   --   HGB 11.0* 10.3* 11.1*  HCT 32.7* 31.0* 33.1*  MCV 86.3 85.9 87.3  PLT 170 159 0000000   Basic Metabolic Panel:  Recent Labs Lab 03/17/16 1818 03/18/16 0625 03/19/16 0519 03/20/16 0507 03/21/16 0525  NA 139 139 137 139 141  K 4.0 3.4* 3.6 3.9 4.0  CL 104 105 101 103 106  CO2 25 24 26 27 28   GLUCOSE 121* 145* 121* 111* 122*  BUN 27* 27* 42* 48* 49*  CREATININE 1.68* 1.57* 2.02* 2.08* 1.80*  CALCIUM 10.2 10.2 9.7 9.6 9.8  MG  --  1.7  --   --   --    GFR: Estimated Creatinine  Clearance: 32.2 mL/min (by C-G formula based on Cr of 1.8). Liver Function Tests:  Recent Labs Lab 03/17/16 1818  AST 21  ALT 20  ALKPHOS 76  BILITOT 0.6  PROT 7.2  ALBUMIN 4.0   No results for input(s): LIPASE, AMYLASE in the last 168 hours. No results for input(s): AMMONIA in the last 168 hours. Coagulation Profile: No results for input(s): INR, PROTIME in the last 168 hours. Cardiac Enzymes:  Recent Labs Lab 03/17/16 1818 03/18/16 0032 03/18/16 0625  TROPONINI 0.18* 0.20* 0.18*   BNP (last 3 results) No results for input(s): PROBNP in the last 8760 hours. HbA1C: No results for input(s): HGBA1C in the last 72 hours. CBG:  Recent Labs Lab 03/20/16 1250 03/20/16 1634 03/20/16 2107 03/21/16 0725 03/21/16 1157  GLUCAP 174* 125* 123* 108* 182*   Lipid Profile: No results for input(s): CHOL, HDL, LDLCALC, TRIG, CHOLHDL, LDLDIRECT in the last 72 hours. Thyroid Function Tests: No results for input(s): TSH, T4TOTAL, FREET4, T3FREE, THYROIDAB in the last 72 hours. Anemia Panel: No results for input(s): VITAMINB12, FOLATE, FERRITIN, TIBC, IRON, RETICCTPCT in the last 72 hours. Urine analysis:    Component Value Date/Time   COLORURINE YELLOW 03/17/2016 2234   APPEARANCEUR CLOUDY* 03/17/2016 2234   LABSPEC 1.020 03/17/2016 2234   LABSPEC 1.020 12/22/2010 1142   PHURINE 6.0 03/17/2016 2234   PHURINE 5.0 12/22/2010 1142   GLUCOSEU NEGATIVE 03/17/2016 2234   HGBUR NEGATIVE 03/17/2016 2234   HGBUR Negative 12/22/2010 1142   BILIRUBINUR NEGATIVE 03/17/2016 2234   BILIRUBINUR Negative 12/22/2010 1142   KETONESUR NEGATIVE 03/17/2016 2234   KETONESUR Negative 12/22/2010 1142   PROTEINUR NEGATIVE 03/17/2016 2234   PROTEINUR Negative 12/22/2010 1142   UROBILINOGEN 0.2 07/15/2015 1349   NITRITE NEGATIVE 03/17/2016 2234   NITRITE Negative 12/22/2010 1142   LEUKOCYTESUR NEGATIVE 03/17/2016 2234   LEUKOCYTESUR Negative 12/22/2010 1142   Sepsis  Labs: @LABRCNTIP (procalcitonin:4,lacticidven:4)  )No results found for this or any previous visit (from the past 240 hour(s)).    Radiology Studies: Nm Myocar Multi W/spect W/wall Motion / Ef  03/20/2016  CLINICAL DATA:  CHF, hypertension, diabetes EXAM: MYOCARDIAL IMAGING WITH SPECT (REST AND PHARMACOLOGIC-STRESS) GATED LEFT VENTRICULAR WALL MOTION STUDY LEFT VENTRICULAR EJECTION FRACTION TECHNIQUE: Standard myocardial SPECT imaging was performed after resting intravenous injection of 10 mCi Tc-78m sestamibi. Subsequently, intravenous infusion of Lexiscan was performed under the supervision of the Cardiology staff. At peak effect of the drug, 30 mCi Tc-11m sestamibi was injected intravenously and standard myocardial SPECT imaging was performed. Quantitative gated imaging was also performed to evaluate left ventricular wall motion, and estimate left ventricular ejection fraction. COMPARISON:  None. FINDINGS: Perfusion: There is decreased activity noted inferiorly on rest images, worsening on stress images. No other areas of reversibility. Wall Motion: Decreased wall motion noted inferiorly. This segment does thickened. Mild generalized hypokinesia. Left Ventricular Ejection Fraction: 35 % End diastolic  volume 0000000 ml End systolic volume 123XX123 ml IMPRESSION: 1. Decreased activity inferiorly on rest images, which worsens on stress images. This is concerning for back from I continue a shin with superimposed inducible ischemia. 2. Mild generalized hypokinesia. More pronounced decreased wall abnormality motion inferiorly. 3. Left ventricular ejection fraction 35% 4. High-risk stress test findings*. *2012 Appropriate Use Criteria for Coronary Revascularization Focused Update: J Am Coll Cardiol. [id number]. http://content.[email or web address].aspx?articleid=[email or web address] Electronically Signed   By: Rolm Baptise M.D.   On: 03/20/2016 12:58     Scheduled Meds: . aspirin EC  81 mg Oral Daily  . captopril  12.5  mg Oral BID  . carvedilol  3.125 mg Oral BID WC  . enoxaparin (LOVENOX) injection  40 mg Subcutaneous Q24H  . ferrous sulfate  650 mg Oral Daily  . insulin aspart  0-15 Units Subcutaneous TID WC  . insulin aspart  0-5 Units Subcutaneous QHS  . nortriptyline  10 mg Oral QHS  . pantoprazole  40 mg Oral Daily  . pravastatin  40 mg Oral q1800  . sodium chloride flush  3 mL Intravenous Q12H   Continuous Infusions:    LOS: 1 day   Time Spent in minutes   30 minutes  Mandy Rhodes D.O. on 03/21/2016 at 1:26 PM  Between 7am to 7pm - Pager - [phone]  After 7pm go to [email or web address] - password [email or web address]  And look for the night coverage person covering for me after hours  [facility] Office  [phone]

## 2016-03-21 NOTE — Consult Note (Signed)
   [facility] CM Inpatient Consult   03/21/2016  Mandy Rhodes 01-12-1941 [id number]   [facility] Care Management referral received. Spoke with inpatient RNCM prior to visiting patient at bedside. Went to bedside to speak with patient. However, she has a room full of visitors. Asked that writer come back at later time.   Marthenia Rolling, MSN-Ed, RN,BSN [facility] Liaison [phone]

## 2016-03-21 NOTE — Care Management Note (Signed)
Case Management Note  Patient Details  Name: SNEHA HASHIMOTO MRN: [id number] Date of Birth: Apr 03, 1941  Subjective/Objective:  [facility] following for disease mgmnt, await HHRN,f50f order.Rep Santiago Glad aware.                 Action/Plan:d/c plan home w/HHC.   Expected Discharge Date:                  Expected Discharge Plan:  [place]  In-House Referral:  NA  Discharge planning Services  CM Consult  Post Acute Care Choice:  NA Choice offered to:  NA  DME Arranged:  N/A DME Agency:  NA  HH Arranged:  NA HH Agency:  NA  Status of Service:  Completed, signed off  Medicare Important Message Given:    Date Medicare IM Given:    Medicare IM give by:    Date Additional Medicare IM Given:    Additional Medicare Important Message give by:     If discussed at [place] of Stay Meetings, dates discussed:    Additional Comments:  Dessa Phi, RN 03/21/2016, 1:30 PM

## 2016-03-21 NOTE — Care Management Note (Signed)
Case Management Note  Patient Details  Name: EDELL DELEON MRN: [id number] Date of Birth: 03-Jul-1941  Subjective/Objective:Cath in am. Patient issued Advanced Beneficiary Notice(ABN)-patient voiced understanding.MD notified. [facility] following for post d/c services.                   Action/Plan:d/c plan home w/HHC.   Expected Discharge Date:                  Expected Discharge Plan:  [place]  In-House Referral:  NA  Discharge planning Services  CM Consult  Post Acute Care Choice:  NA Choice offered to:  NA  DME Arranged:  N/A DME Agency:  NA  HH Arranged:  NA, RN [place] Agency:  NA, [place]  Status of Service:  Completed, signed off  Medicare Important Message Given:    Date Medicare IM Given:    Medicare IM give by:    Date Additional Medicare IM Given:    Additional Medicare Important Message give by:     If discussed at [place] of Stay Meetings, dates discussed:    Additional Comments:  Dessa Phi, RN 03/21/2016, 3:06 PM

## 2016-03-22 ENCOUNTER — Encounter (HOSPITAL_COMMUNITY)
Admission: EM | Disposition: A | Discharge status: Home / Self Care | Payer: Self-pay | Source: Home / Self Care | Attending: Internal Medicine

## 2016-03-22 ENCOUNTER — Encounter (HOSPITAL_COMMUNITY): Payer: Self-pay | Admitting: Cardiology

## 2016-03-22 ENCOUNTER — Encounter: Payer: Self-pay | Admitting: *Deleted

## 2016-03-22 DIAGNOSIS — R931 Abnormal findings on diagnostic imaging of heart and coronary circulation: Secondary | ICD-10-CM

## 2016-03-22 DIAGNOSIS — R7989 Other specified abnormal findings of blood chemistry: Secondary | ICD-10-CM | POA: Diagnosis not present

## 2016-03-22 DIAGNOSIS — N183 Chronic kidney disease, stage 3 (moderate): Secondary | ICD-10-CM | POA: Diagnosis not present

## 2016-03-22 DIAGNOSIS — I5021 Acute systolic (congestive) heart failure: Secondary | ICD-10-CM | POA: Diagnosis not present

## 2016-03-22 DIAGNOSIS — I10 Essential (primary) hypertension: Secondary | ICD-10-CM | POA: Diagnosis not present

## 2016-03-22 HISTORY — PX: CARDIAC CATHETERIZATION: SHX172

## 2016-03-22 LAB — BASIC METABOLIC PANEL
ANION GAP: 8 (ref 5–15)
BUN: 46 mg/dL — AB (ref 6–20)
CALCIUM: 9.7 mg/dL (ref 8.9–10.3)
CO2: 27 mmol/L (ref 22–32)
CREATININE: 1.57 mg/dL — AB (ref 0.44–1.00)
Chloride: 108 mmol/L (ref 101–111)
GFR calc Af Amer: 36 mL/min — ABNORMAL LOW (ref >60)
GFR, EST NON AFRICAN AMERICAN: 31 mL/min — AB (ref >60)
GLUCOSE: 113 mg/dL — AB (ref 65–99)
Potassium: 4 mmol/L (ref 3.5–5.1)
Sodium: 143 mmol/L (ref 135–145)

## 2016-03-22 LAB — GLUCOSE, CAPILLARY
GLUCOSE-CAPILLARY: 150 mg/dL — AB (ref 65–99)
Glucose-Capillary: 87 mg/dL (ref 65–99)
Glucose-Capillary: 71 mg/dL (ref 65–99)
Glucose-Capillary: 113 mg/dL — ABNORMAL HIGH (ref 65–99)

## 2016-03-22 SURGERY — LEFT HEART CATH AND CORONARY ANGIOGRAPHY

## 2016-03-22 MED ORDER — HEPARIN (PORCINE) IN NACL 2-0.9 UNIT/ML-% IJ SOLN
INTRAMUSCULAR | Status: AC
  Filled 2016-03-22: qty 1500

## 2016-03-22 MED ORDER — SODIUM CHLORIDE 0.9 % IV SOLN: 250.0000 mL | INTRAVENOUS | Status: DC | PRN

## 2016-03-22 MED ORDER — SODIUM CHLORIDE 0.9% FLUSH: 3.0000 mL | Freq: Two times a day (BID) | INTRAVENOUS | Status: DC

## 2016-03-22 MED ORDER — HEPARIN (PORCINE) IN NACL 2-0.9 UNIT/ML-% IJ SOLN
INTRAMUSCULAR | Status: DC | PRN
  Administered 2016-03-22: 1500 mL

## 2016-03-22 MED ORDER — HEPARIN SODIUM (PORCINE) 1000 UNIT/ML IJ SOLN: INTRAMUSCULAR | Status: DC | PRN

## 2016-03-22 MED ORDER — VERAPAMIL HCL 2.5 MG/ML IV SOLN
INTRAVENOUS | Status: AC
  Filled 2016-03-22: qty 2

## 2016-03-22 MED ORDER — SODIUM CHLORIDE 0.9% FLUSH: 3.0000 mL | INTRAVENOUS | Status: DC | PRN

## 2016-03-22 MED ORDER — FENTANYL CITRATE (PF) 100 MCG/2ML IJ SOLN
INTRAMUSCULAR | Status: AC
  Filled 2016-03-22: qty 2

## 2016-03-22 MED ORDER — SODIUM CHLORIDE 0.9 % WEIGHT BASED INFUSION: 1.0000 mL/kg/h | INTRAVENOUS | Status: AC

## 2016-03-22 MED ORDER — MIDAZOLAM HCL 2 MG/2ML IJ SOLN
INTRAMUSCULAR | Status: DC | PRN
  Administered 2016-03-22: 1 mg via INTRAVENOUS

## 2016-03-22 MED ORDER — LIDOCAINE HCL (PF) 1 % IJ SOLN
INTRAMUSCULAR | Status: DC | PRN
  Administered 2016-03-22: 2 mL
  Administered 2016-03-22: 10 mL

## 2016-03-22 MED ORDER — ENOXAPARIN SODIUM 40 MG/0.4ML ~~LOC~~ SOLN
40.0000 mg | SUBCUTANEOUS | Status: DC
  Administered 2016-03-23: 40 mg via SUBCUTANEOUS
  Filled 2016-03-22: qty 0.4

## 2016-03-22 MED ORDER — FENTANYL CITRATE (PF) 100 MCG/2ML IJ SOLN
INTRAMUSCULAR | Status: DC | PRN
  Administered 2016-03-22: 25 ug via INTRAVENOUS

## 2016-03-22 MED ORDER — LIDOCAINE HCL (PF) 1 % IJ SOLN
INTRAMUSCULAR | Status: AC
  Filled 2016-03-22: qty 30

## 2016-03-22 MED ORDER — IOPAMIDOL (ISOVUE-370) INJECTION 76%
INTRAVENOUS | Status: AC
  Filled 2016-03-22: qty 100

## 2016-03-22 MED ORDER — MIDAZOLAM HCL 2 MG/2ML IJ SOLN
INTRAMUSCULAR | Status: AC
  Filled 2016-03-22: qty 2

## 2016-03-22 MED ORDER — HEPARIN SODIUM (PORCINE) 1000 UNIT/ML IJ SOLN
INTRAMUSCULAR | Status: AC
  Filled 2016-03-22: qty 1

## 2016-03-22 MED ORDER — VERAPAMIL HCL 2.5 MG/ML IV SOLN
INTRAVENOUS | Status: DC | PRN
  Administered 2016-03-22: 10 mL via INTRA_ARTERIAL

## 2016-03-22 SURGICAL SUPPLY — 14 items
CATH INFINITI 5FR ANG PIGTAIL (CATHETERS) ×2 IMPLANT
CATH INFINITI 5FR JL4 (CATHETERS) ×2 IMPLANT
CATH INFINITI JR4 5F (CATHETERS) ×2 IMPLANT
DEVICE RAD COMP TR BAND LRG (VASCULAR PRODUCTS) ×2 IMPLANT
GLIDESHEATH SLEND SS 6F .021 (SHEATH) ×2 IMPLANT
KIT HEART LEFT (KITS) ×2 IMPLANT
PACK CARDIAC CATHETERIZATION (CUSTOM PROCEDURE TRAY) ×2 IMPLANT
SHEATH PINNACLE 5F 10CM (SHEATH) ×2 IMPLANT
SYR MEDRAD MARK V 150ML (SYRINGE) ×2 IMPLANT
TRANSDUCER W/STOPCOCK (MISCELLANEOUS) ×2 IMPLANT
TUBING CIL FLEX 10 FLL-RA (TUBING) ×2 IMPLANT
WIRE EMERALD 3MM-J .025X260CM (WIRE) IMPLANT
WIRE HI TORQ VERSACORE-J 145CM (WIRE) ×2 IMPLANT
WIRE SAFE-T 1.5MM-J .035X260CM (WIRE) ×2 IMPLANT

## 2016-03-22 NOTE — H&P (View-Only) (Signed)
Patient Name: Mandy Rhodes Date of Encounter: 03/21/2016     Principal Problem:   Acute systolic congestive heart failure, NYHA class 3 ([place]) Active Problems:   Diabetes mellitus ([place])   Cardiomegaly   CKD (chronic kidney disease) stage 2, GFR 60-89 ml/min   Elevated troponin   Essential hypertension   DM type 2 causing CKD stage 2 (HCC)   Elevated troponin I level   Cough   CHF (congestive heart failure) (HCC)   CKD (chronic kidney disease)    SUBJECTIVE No SOB  No CP, breathing back to baseline. Pt says she had a heart cath over 10 years ago in [place] that did have a blockage but nothing to fix  CURRENT MEDS . aspirin EC  81 mg Oral Daily  . captopril  12.5 mg Oral BID  . carvedilol  3.125 mg Oral BID WC  . enoxaparin (LOVENOX) injection  40 mg Subcutaneous Q24H  . ferrous sulfate  650 mg Oral Daily  . insulin aspart  0-15 Units Subcutaneous TID WC  . insulin aspart  0-5 Units Subcutaneous QHS  . nortriptyline  10 mg Oral QHS  . pantoprazole  40 mg Oral Daily  . pravastatin  40 mg Oral q1800  . sodium chloride flush  3 mL Intravenous Q12H    OBJECTIVE  Filed Vitals:   03/20/16 0445 03/20/16 1430 03/20/16 2108 03/21/16 0500  BP: 106/50 114/63 110/62 94/73  Pulse: 59 78 61 68  Temp: 97.7 F (36.5 C) 97.5 F (36.4 C) 98 F (36.7 C) 98.2 F (36.8 C)  TempSrc: Oral Oral Oral Oral  Resp: 20 20 20 18   Height:      Weight: 212 lb 8.4 oz (96.4 kg)   214 lb 11.7 oz (97.4 kg)  SpO2: 100% 100% 98% 100%    Intake/Output Summary (Last 24 hours) at 03/21/16 0646 Last data filed at 03/21/16 0311  Gross per 24 hour  Intake    240 ml  Output   1150 ml  Net   -910 ml   Filed Weights   03/19/16 0422 03/20/16 0445 03/21/16 0500  Weight: 216 lb (97.977 kg) 212 lb 8.4 oz (96.4 kg) 214 lb 11.7 oz (97.4 kg)    PHYSICAL EXAM  General: Pleasant, NAD. Neuro: Alert and oriented X 3. Moves all extremities spontaneously. Psych: Normal affect. HEENT:  Normal  Neck:  Supple without bruits or JVD. Lungs:  Resp regular and unlabored, CTA. Heart: RRR no s3, s4, or murmurs. Abdomen: Soft, non-tender, non-distended, BS + x 4.  Extremities: No clubbing, cyanosis or edema. DP/PT/Radials 2+ and equal bilaterally.  Accessory Clinical Findings  CBC  Recent Labs  03/20/16 0507 03/21/16 0525  WBC 4.5 4.1  HGB 10.3* 11.1*  HCT 31.0* 33.1*  MCV 85.9 87.3  PLT 159 0000000   Basic Metabolic Panel  Recent Labs  03/20/16 0507 03/21/16 0525  NA 139 141  K 3.9 4.0  CL 103 106  CO2 27 28  GLUCOSE 111* 122*  BUN 48* 49*  CREATININE 2.08* 1.80*  CALCIUM 9.6 9.8    TELE NSR w/ PVCs  Radiology/Studies  Dg Chest 2 View  03/17/2016  CLINICAL DATA:  Productive cough. EXAM: CHEST  2 VIEW COMPARISON:  No recent. FINDINGS: Cardiomegaly with normal pulmonary vascularity. No focal infiltrate. No pleural effusion or pneumothorax. No acute bony abnormality . IMPRESSION: Cardiomegaly. No overt congestive heart failure. Exam otherwise unremarkable. Electronically Signed   By: Marcello Moores  Register   On: 03/17/2016 16:35  Nm Myocar Multi W/spect W/wall Motion / Ef  03/20/2016  CLINICAL DATA:  CHF, hypertension, diabetes EXAM: MYOCARDIAL IMAGING WITH SPECT (REST AND PHARMACOLOGIC-STRESS) GATED LEFT VENTRICULAR WALL MOTION STUDY LEFT VENTRICULAR EJECTION FRACTION TECHNIQUE: Standard myocardial SPECT imaging was performed after resting intravenous injection of 10 mCi Tc-72m sestamibi. Subsequently, intravenous infusion of Lexiscan was performed under the supervision of the Cardiology staff. At peak effect of the drug, 30 mCi Tc-42m sestamibi was injected intravenously and standard myocardial SPECT imaging was performed. Quantitative gated imaging was also performed to evaluate left ventricular wall motion, and estimate left ventricular ejection fraction. COMPARISON:  None. FINDINGS: Perfusion: There is decreased activity noted inferiorly on rest images, worsening on stress images. No  other areas of reversibility. Wall Motion: Decreased wall motion noted inferiorly. This segment does thickened. Mild generalized hypokinesia. Left Ventricular Ejection Fraction: 35 % End diastolic volume 0000000 ml End systolic volume 123XX123 ml IMPRESSION: 1. Decreased activity inferiorly on rest images, which worsens on stress images. This is concerning for back from I continue a shin with superimposed inducible ischemia. 2. Mild generalized hypokinesia. More pronounced decreased wall abnormality motion inferiorly. 3. Left ventricular ejection fraction 35% 4. High-risk stress test findings*. *2012 Appropriate Use Criteria for Coronary Revascularization Focused Update: J Am Coll Cardiol. [id number]. http://content.[email or web address]?[email or web address] Electronically Signed   By: Rolm Baptise M.D.   On: 03/20/2016 12:58    2D ECHO: 03/18/2016 LV EF: 35% - 40% Study Conclusions - Left ventricle: The cavity size was normal. Wall thickness was  increased in a pattern of severe LVH. Bright-appearing  interatrial septum -consider infiltrative cardiomyopathy such as  amyloidosis. Systolic function was moderately reduced. The  estimated ejection fraction was in the range of 35% to 40%.  Diffuse hypokinesis. Doppler parameters are consistent with  abnormal left ventricular relaxation (grade 2 diastolic  dysfunction). The E/e&' ratio is >15, suggesting elevated LV  filling pressure. - Aortic valve: Trileaflet. Sclerosis without stenosis. There was  no regurgitation. - Mitral valve: Mildly thickened leaflets . There was trivial  regurgitation. - Left atrium: Moderately dilated at 44 ml/m2. - Right ventricle: The cavity size was mildly dilated. - Right atrium: The atrium was mildly dilated. - Inferior vena cava: The vessel was normal in size. The  respirophasic diameter changes were in the normal range (>= 50%),  consistent with normal central venous pressure. Impressions: - LVEF  35-40%, global hypokinesis, severe LVH with possible  infiltrative cardiomyopathy, Grade 2 diastolic dysfunction with  elevated LV filling pressure, moderate LAE, mild RAE, mild RVE,  normal IVC. Consider additional imaging test such a cMRI to  further evaluate LV wall thickening.   ASSESSMENT AND PLAN Mandy Rhodes is a 75 y.o. female with a history of IDDM, CKD 3, HTN, and anemia who presented to [facility] on [facility]/17/16 with cough and orthopnea. Her troponin was noted to be elevated and cardiology was consulted.   New acute combined S/D CHF:  -- Presented with cough and orthopnea. BNP mildly elevated and CXR with cardiomegaly without overt edema. -- 2D ECHO with newly reduced EF (35-40%), severe LVH with bright interstitial septum - ?infiltrative cardiomyopathy such as amyloid, Grade 2 DD, moderate LAE. -- Started on IV lasix, net neg 921mL. Weight down 2 lbs. Appears clinically euvolemic and creat rising, so lasix held and captopril decreased to 12.5 mg BID. Can likely start on PO lasix today if we are deciding against cath.  -- Due to CKD, she underwent lexiscan myoview instead of cath which showed possible inducible  ischemia -- Continue BB and ACE.   Elevated troponin: c/w demand in the setting of acute CHF. Myoview showed possible inducible ischemia. Will defer to Dr. Claiborne Billings next steps in work up. Of note, pt says she had a heart cath over 10 years ago in [place] that did have a blockage but nothing to fix  Acute on CKD: creat up to 2.08. It was 1.57 on 03/18/16. Lasix currently held. ACE decreased. Creat improved to 1.8 today.  HTN: BP normal to soft.   Mandy Pimple PA-C  Pager [phone]  Patient seen and examined. Agree with assessment and plan. No chest pain. Nuclear study revealed EF 35% with mild generalized hypokinesis, more pronounced inferiorly with suggestion of mild inferior ischemia. High risk because of EF. I discussed possible cath with patient. She wants to  further discuss with daughter. Cr better but still elevated. She had mild CAD at cath > 10 yrs ago.  Will start gentle hydration at 30 cc/hr. Keep NPO in am for potential cath tomorrow or next day if renal fxn is better   [name], MD, [facility] 03/21/2016 2:15 PM

## 2016-03-22 NOTE — Progress Notes (Signed)
Patient Name: Mandy Rhodes Date of Encounter: 03/22/2016  Hospital Problem List     Principal Problem:   Acute systolic congestive heart failure, NYHA class 3 (HCC) Active Problems:   Diabetes mellitus (HCC)   Cardiomegaly   CKD (chronic kidney disease) stage 2, GFR 60-89 ml/min   Elevated troponin   Essential hypertension   DM type 2 causing CKD stage 2 (HCC)   Elevated troponin I level   Cough   CHF (congestive heart failure) (HCC)   CKD (chronic kidney disease)    Subjective   Feeling well this morning. Plan for cath this afternoon.  Inpatient Medications    . aspirin EC  81 mg Oral Daily  . captopril  12.5 mg Oral BID  . carvedilol  3.125 mg Oral BID WC  . enoxaparin (LOVENOX) injection  40 mg Subcutaneous Q24H  . ferrous sulfate  650 mg Oral Daily  . insulin aspart  0-15 Units Subcutaneous TID WC  . insulin aspart  0-5 Units Subcutaneous QHS  . nortriptyline  10 mg Oral QHS  . pantoprazole  40 mg Oral Daily  . pravastatin  40 mg Oral q1800  . sodium chloride flush  3 mL Intravenous Q12H    Vital Signs    Filed Vitals:   03/21/16 0500 03/21/16 1341 03/21/16 2012 03/22/16 0453  BP: 94/73 104/57 117/57 129/66  Pulse: 68 69 65 68  Temp: 98.2 F (36.8 C) 97.5 F (36.4 C) 98.7 F (37.1 C) 98 F (36.7 C)  TempSrc: Oral Oral Oral Oral  Resp: 18 18 18 18   Height:      Weight: 214 lb 11.7 oz (97.4 kg)   217 lb 6 oz (98.6 kg)  SpO2: 100% 98% 99%     Intake/Output Summary (Last 24 hours) at 03/22/16 0806 Last data filed at 03/22/16 0454  Gross per 24 hour  Intake    720 ml  Output    900 ml  Net   -180 ml   Filed Weights   03/20/16 0445 03/21/16 0500 03/22/16 0453  Weight: 212 lb 8.4 oz (96.4 kg) 214 lb 11.7 oz (97.4 kg) 217 lb 6 oz (98.6 kg)    Physical Exam    General: Pleasant, NAD. Neuro: Alert and oriented X 3. Moves all extremities spontaneously. Psych: Normal affect. HEENT:  Normal  Neck: Supple without bruits or JVD. Lungs:  Resp  regular and unlabored, CTA. Heart: RRR no s3, s4, or murmurs. Abdomen: Soft, non-tender, non-distended, BS + x 4.  Extremities: No clubbing, cyanosis or edema. DP/PT/Radials 2+ and equal bilaterally.  Labs    CBC  Recent Labs  03/20/16 0507 03/21/16 0525  WBC 4.5 4.1  HGB 10.3* 11.1*  HCT 31.0* 33.1*  MCV 85.9 87.3  PLT 159 0000000   Basic Metabolic Panel  Recent Labs  03/21/16 0525 03/22/16 0504  NA 141 143  K 4.0 4.0  CL 106 108  CO2 28 27  GLUCOSE 122* 113*  BUN 49* 46*  CREATININE 1.80* 1.57*  CALCIUM 9.8 9.7    Telemetry    SR Rate-60s PVCs  ECG    No recent EKG  Radiology    Nm Myocar Multi W/spect W/wall Motion / Ef  03/20/2016  CLINICAL DATA:  CHF, hypertension, diabetes EXAM: MYOCARDIAL IMAGING WITH SPECT (REST AND PHARMACOLOGIC-STRESS) GATED LEFT VENTRICULAR WALL MOTION STUDY LEFT VENTRICULAR EJECTION FRACTION TECHNIQUE: Standard myocardial SPECT imaging was performed after resting intravenous injection of 10 mCi Tc-93m sestamibi. Subsequently, intravenous infusion of  Lexiscan was performed under the supervision of the Cardiology staff. At peak effect of the drug, 30 mCi Tc-22m sestamibi was injected intravenously and standard myocardial SPECT imaging was performed. Quantitative gated imaging was also performed to evaluate left ventricular wall motion, and estimate left ventricular ejection fraction. COMPARISON:  None. FINDINGS: Perfusion: There is decreased activity noted inferiorly on rest images, worsening on stress images. No other areas of reversibility. Wall Motion: Decreased wall motion noted inferiorly. This segment does thickened. Mild generalized hypokinesia. Left Ventricular Ejection Fraction: 35 % End diastolic volume 0000000 ml End systolic volume 123XX123 ml IMPRESSION: 1. Decreased activity inferiorly on rest images, which worsens on stress images. This is concerning for back from I continue a shin with superimposed inducible ischemia. 2. Mild generalized  hypokinesia. More pronounced decreased wall abnormality motion inferiorly. 3. Left ventricular ejection fraction 35% 4. High-risk stress test findings*. *2012 Appropriate Use Criteria for Coronary Revascularization Focused Update: J Am Coll Cardiol. [id number]. http://[email or web address]?[email or web address] Electronically Signed   By: Rolm Baptise M.D.   On: 03/20/2016 12:58    Assessment & Plan    Mandy Rhodes is a 75 y.o. female with a history of IDDM, CKD 3, HTN, and anemia who presented to [facility] on 03/17/16 with cough and orthopnea. Her troponin was noted to be elevated and cardiology was consulted.   New acute combined S/D CHF:  -- Presented with cough and orthopnea. BNP mildly elevated and CXR with cardiomegaly without overt edema. -- 2D ECHO with newly reduced EF (35-40%), severe LVH with bright interstitial septum - ?infiltrative cardiomyopathy such as amyloid, Grade 2 DD, moderate LAE. -- Started on IV lasix, net neg 1175mL. Weight stable. Appears clinically euvolemic and creat better this morning.  -- Due to CKD, she underwent lexiscan myoview instead of cath which showed possible inducible ischemia --Plan for cath this afternoon since Cr has improved over night. -- Continue BB and ACE.   Elevated troponin: c/w demand in the setting of acute CHF. Myoview showed possible inducible ischemia. Of note, pt says she had a heart cath over 10 years ago in [place] that did have a blockage but nothing to fix. Plan for cath this afternoon.  Acute on CKD: creat up to 2.08, now down to 1.57 this am. Lasix currently held. ACE decreased.   HTN: BP normal to soft.    Signed, Reino Bellis NP-C Pager [phone]  Patient seen and examined. Agree with assessment and plan. Renal function improved today.at 1.57. No chest pain. Lasix held.  For cath today. The risks and benefits of a cardiac catheterization including, but not limited to, death, stroke, MI, kidney damage  and bleeding were discussed with the patient who indicates understanding and agrees to proceed.   [name], MD, [facility]  Addendum:  Cath just completed data reviewed;  No significant obstructive CAD with 20% proximal LAD and 10% Ramus stenosis.  LVEDP 19 mm HG.  Medical therapy.  [name], MD, [facility] 03/22/2016 11:52 AM

## 2016-03-22 NOTE — Progress Notes (Signed)
Site area: rt groin fa sheath Site Prior to Removal:  Level 0 Pressure Applied For: 20 minutes Manual:   yes Patient Status During Pull:  stable Post Pull Site:  Level  0 Post Pull Instructions Given:  yes Post Pull Pulses Present: yes, rt dp Dressing Applied:  tegaderm Bedrest begins @  1220 Comments:

## 2016-03-22 NOTE — Progress Notes (Signed)
TR BAND REMOVAL  LOCATION:    Radial rt wrist  DEFLATED PER PROTOCOL: yes    TIME BAND OFF / DRESSING APPLIED:    1514, small tegaderm  SITE UPON ARRIVAL:    Level  0  SITE AFTER BAND REMOVAL:    Level  0  CIRCULATION SENSATION AND MOVEMENT:    Within Normal Limits :  yes  COMMENTS:   [facility] here to transport patient back to [facility]. Has had regular gingerale and graham crackers; did not want anything else to eat. No family here.

## 2016-03-22 NOTE — Interval H&P Note (Signed)
History and Physical Interval Note:  03/22/2016 10:53 AM  Mandy Rhodes  has presented today for surgery, with the diagnosis of cp  The various methods of treatment have been discussed with the patient and family. After consideration of risks, benefits and other options for treatment, the patient has consented to  Procedure(s): Left Heart Cath and Coronary Angiography (N/A) as a surgical intervention .  The patient's history has been reviewed, patient examined, no change in status, stable for surgery.  I have reviewed the patient's chart and labs.  Questions were answered to the patient's satisfaction.   Cath Lab Visit (complete for each Cath Lab visit)  Clinical Evaluation Leading to the Procedure:   ACS: Yes.    Non-ACS:    Anginal Classification: CCS II  Anti-ischemic medical therapy: Minimal Therapy (1 class of medications)  Non-Invasive Test Results: Intermediate-risk stress test findings: cardiac mortality 1-3%/year  Prior CABG: No previous CABG        Mandy Rhodes [facility] 03/22/2016 10:53 AM

## 2016-03-22 NOTE — Progress Notes (Signed)
PROGRESS NOTE        PATIENT DETAILS Name: Mandy Rhodes Age: 75 y.o. Sex: female Date of Birth: 02-Sep-1941 Admit Date: 03/17/2016 Admitting Physician Toy Baker, MD [id number] S, MD Outpatient Specialists:None  Brief Narrative: Patient is a 75 y.o. female is an 75 y.o. female past medical history of diabetes mellitus chronic kidney disease stage 3, with a baseline creatinine of 1.6-2.0, hypertension -further evaluation revealed acute systolic heart failure.  Subjective: Breathing much better-lower extremity edema is significantly improved.  Assessment/Plan: Principal Problem: Acute systolic congestive heart failure: EF around 35-40%, diuresed with IV Lasix. Much more compensated. Cardiology consulted, underwent nuclear stress test that was positive for ischemia-however cardiac catheterization on 5/3-showed nonobstructive CAD. Await further recommendations from cardiology.  Active Problems: Nonobstructive CAD: LHC on 5/3 showed 20% proximal LAD and 10% Ramus stenosis.Plan to manage medically.  Minimally elevated troponin: Likely secondary to acute CHF, LHC as above.  AKI on chronic kidney disease stage III: Improving-Lasix remains on hold.  Hypertension: Controlled, continue lisinopril and Coreg  Type 2 diabetes: CBGs stable, continue SSI  DVT Prophylaxis: SCD's  Code Status: Full code   Family Communication: None at bedside  Disposition Plan: Remain inpatient-home in next day or so  Antimicrobial agents: None  Procedures: 5/3>LHC 4/29>>TTE  CONSULTS:  cardiology  Time spent: 25 minutes-Greater than 50% of this time was spent in counseling, explanation of diagnosis, planning of further management, and coordination of care.  MEDICATIONS: Anti-infectives    None      Scheduled Meds: . aspirin EC  81 mg Oral Daily  . captopril  12.5 mg Oral BID  . carvedilol  3.125 mg Oral BID WC  . [START ON 03/23/2016]  enoxaparin (LOVENOX) injection  40 mg Subcutaneous Q24H  . ferrous sulfate  650 mg Oral Daily  . insulin aspart  0-15 Units Subcutaneous TID WC  . insulin aspart  0-5 Units Subcutaneous QHS  . nortriptyline  10 mg Oral QHS  . pantoprazole  40 mg Oral Daily  . pravastatin  40 mg Oral q1800  . sodium chloride flush  3 mL Intravenous Q12H  . sodium chloride flush  3 mL Intravenous Q12H   Continuous Infusions: . sodium chloride 1 mL/kg/hr (03/22/16 1325)   PRN Meds:.sodium chloride, sodium chloride, acetaminophen, ondansetron (ZOFRAN) IV, sodium chloride flush, sodium chloride flush, traMADol   PHYSICAL EXAM: Vital signs: Filed Vitals:   03/22/16 1335 03/22/16 1350 03/22/16 1405 03/22/16 1500  BP: 133/46 137/74 138/74 130/70  Pulse: 68 63 76 68  Temp:    98.6 F (37 C)  TempSrc:    Oral  Resp: 20 18 17 20   Height:      Weight:      SpO2: 98% 99% 99% 98%   Filed Weights   03/20/16 0445 03/21/16 0500 03/22/16 0453  Weight: 96.4 kg (212 lb 8.4 oz) 97.4 kg (214 lb 11.7 oz) 98.6 kg (217 lb 6 oz)   Body mass index is 35.1 kg/(m^2).   Gen Exam: Awake and alert with clear speech. Not in any distress  Neck: Supple, No JVD.  Chest: B/L Clear.   CVS: S1 S2 Regular, no murmurs.  Abdomen: soft, BS +, non tender, non distended. Extremities: no edema, lower extremities warm to touch Neurologic: Non Focal.  Skin: No Rash or lesions   Wounds: N/A.    LABORATORY DATA:  CBC:  Recent Labs Lab 03/17/16 1818 03/20/16 0507 03/21/16 0525  WBC 4.4 4.5 4.1  NEUTROABS 2.4  --   --   HGB 11.0* 10.3* 11.1*  HCT 32.7* 31.0* 33.1*  MCV 86.3 85.9 87.3  PLT 170 159 0000000    Basic Metabolic Panel:  Recent Labs Lab 03/18/16 0625 03/19/16 0519 03/20/16 0507 03/21/16 0525 03/22/16 0504  NA 139 137 139 141 143  K 3.4* 3.6 3.9 4.0 4.0  CL 105 101 103 106 108  CO2 24 26 27 28 27   GLUCOSE 145* 121* 111* 122* 113*  BUN 27* 42* 48* 49* 46*  CREATININE 1.57* 2.02* 2.08* 1.80* 1.57*    CALCIUM 10.2 9.7 9.6 9.8 9.7  MG 1.7  --   --   --   --     GFR: Estimated Creatinine Clearance: 37.2 mL/min (by C-G formula based on Cr of 1.57).  Liver Function Tests:  Recent Labs Lab 03/17/16 1818  AST 21  ALT 20  ALKPHOS 76  BILITOT 0.6  PROT 7.2  ALBUMIN 4.0   No results for input(s): LIPASE, AMYLASE in the last 168 hours. No results for input(s): AMMONIA in the last 168 hours.  Coagulation Profile: No results for input(s): INR, PROTIME in the last 168 hours.  Cardiac Enzymes:  Recent Labs Lab 03/17/16 1818 03/18/16 0032 03/18/16 0625  TROPONINI 0.18* 0.20* 0.18*    BNP (last 3 results) No results for input(s): PROBNP in the last 8760 hours.  HbA1C: No results for input(s): HGBA1C in the last 72 hours.  CBG:  Recent Labs Lab 03/21/16 1157 03/21/16 1652 03/21/16 2113 03/22/16 0800 03/22/16 1232  GLUCAP 182* 109* 144* 87 71    Lipid Profile: No results for input(s): CHOL, HDL, LDLCALC, TRIG, CHOLHDL, LDLDIRECT in the last 72 hours.  Thyroid Function Tests: No results for input(s): TSH, T4TOTAL, FREET4, T3FREE, THYROIDAB in the last 72 hours.  Anemia Panel: No results for input(s): VITAMINB12, FOLATE, FERRITIN, TIBC, IRON, RETICCTPCT in the last 72 hours.  Urine analysis:    Component Value Date/Time   COLORURINE YELLOW 03/17/2016 2234   APPEARANCEUR CLOUDY* 03/17/2016 2234   LABSPEC 1.020 03/17/2016 2234   LABSPEC 1.020 12/22/2010 1142   PHURINE 6.0 03/17/2016 2234   PHURINE 5.0 12/22/2010 1142   GLUCOSEU NEGATIVE 03/17/2016 2234   HGBUR NEGATIVE 03/17/2016 2234   HGBUR Negative 12/22/2010 1142   BILIRUBINUR NEGATIVE 03/17/2016 2234   BILIRUBINUR Negative 12/22/2010 1142   KETONESUR NEGATIVE 03/17/2016 2234   KETONESUR Negative 12/22/2010 1142   PROTEINUR NEGATIVE 03/17/2016 2234   PROTEINUR Negative 12/22/2010 1142   UROBILINOGEN 0.2 07/15/2015 1349   NITRITE NEGATIVE 03/17/2016 2234   NITRITE Negative 12/22/2010 1142    LEUKOCYTESUR NEGATIVE 03/17/2016 2234   LEUKOCYTESUR Negative 12/22/2010 1142    Sepsis Labs: Lactic Acid, Venous No results found for: LATICACIDVEN  MICROBIOLOGY: No results found for this or any previous visit (from the past 240 hour(s)).  RADIOLOGY STUDIES/RESULTS: Dg Chest 2 View  03/17/2016  CLINICAL DATA:  Productive cough. EXAM: CHEST  2 VIEW COMPARISON:  No recent. FINDINGS: Cardiomegaly with normal pulmonary vascularity. No focal infiltrate. No pleural effusion or pneumothorax. No acute bony abnormality . IMPRESSION: Cardiomegaly. No overt congestive heart failure. Exam otherwise unremarkable. Electronically Signed   By: Marcello Moores  Register   On: 03/17/2016 16:35   Nm Myocar Multi W/spect W/wall Motion / Ef  03/20/2016  CLINICAL DATA:  CHF, hypertension, diabetes EXAM: MYOCARDIAL IMAGING WITH SPECT (REST AND PHARMACOLOGIC-STRESS) GATED LEFT  VENTRICULAR WALL MOTION STUDY LEFT VENTRICULAR EJECTION FRACTION TECHNIQUE: Standard myocardial SPECT imaging was performed after resting intravenous injection of 10 mCi Tc-54m sestamibi. Subsequently, intravenous infusion of Lexiscan was performed under the supervision of the Cardiology staff. At peak effect of the drug, 30 mCi Tc-46m sestamibi was injected intravenously and standard myocardial SPECT imaging was performed. Quantitative gated imaging was also performed to evaluate left ventricular wall motion, and estimate left ventricular ejection fraction. COMPARISON:  None. FINDINGS: Perfusion: There is decreased activity noted inferiorly on rest images, worsening on stress images. No other areas of reversibility. Wall Motion: Decreased wall motion noted inferiorly. This segment does thickened. Mild generalized hypokinesia. Left Ventricular Ejection Fraction: 35 % End diastolic volume 0000000 ml End systolic volume 123XX123 ml IMPRESSION: 1. Decreased activity inferiorly on rest images, which worsens on stress images. This is concerning for back from I continue a  shin with superimposed inducible ischemia. 2. Mild generalized hypokinesia. More pronounced decreased wall abnormality motion inferiorly. 3. Left ventricular ejection fraction 35% 4. High-risk stress test findings*. *2012 Appropriate Use Criteria for Coronary Revascularization Focused Update: J Am Coll Cardiol. [id number]. http://content.airportbarriers.com.aspx?articleid=[email or web address] Electronically Signed   By: Rolm Baptise M.D.   On: 03/20/2016 12:58     LOS: 2 days   Oren Binet, MD  [facility] Pager:[phone]  If 7PM-7AM, please contact night-coverage [email or web address] Password [email or web address] 03/22/2016, 3:40 PM

## 2016-03-23 DIAGNOSIS — N183 Chronic kidney disease, stage 3 unspecified: Secondary | ICD-10-CM | POA: Insufficient documentation

## 2016-03-23 DIAGNOSIS — I5021 Acute systolic (congestive) heart failure: Secondary | ICD-10-CM | POA: Diagnosis not present

## 2016-03-23 DIAGNOSIS — I517 Cardiomegaly: Secondary | ICD-10-CM | POA: Diagnosis not present

## 2016-03-23 DIAGNOSIS — E1122 Type 2 diabetes mellitus with diabetic chronic kidney disease: Secondary | ICD-10-CM | POA: Diagnosis not present

## 2016-03-23 LAB — BASIC METABOLIC PANEL
Anion gap: 7 (ref 5–15)
BUN: 33 mg/dL — AB (ref 6–20)
CALCIUM: 9.7 mg/dL (ref 8.9–10.3)
CO2: 22 mmol/L (ref 22–32)
CREATININE: 1.37 mg/dL — AB (ref 0.44–1.00)
Chloride: 108 mmol/L (ref 101–111)
GFR, EST AFRICAN AMERICAN: 43 mL/min — AB (ref >60)
GFR, EST NON AFRICAN AMERICAN: 37 mL/min — AB (ref >60)
Glucose, Bld: 165 mg/dL — ABNORMAL HIGH (ref 65–99)
Potassium: 4.4 mmol/L (ref 3.5–5.1)
SODIUM: 137 mmol/L (ref 135–145)

## 2016-03-23 LAB — GLUCOSE, CAPILLARY
GLUCOSE-CAPILLARY: 90 mg/dL (ref 65–99)
GLUCOSE-CAPILLARY: 108 mg/dL — AB (ref 65–99)

## 2016-03-23 MED ORDER — CAPTOPRIL 12.5 MG PO TABS: 12.5000 mg | ORAL_TABLET | Freq: Two times a day (BID) | ORAL | Status: DC

## 2016-03-23 MED ORDER — FUROSEMIDE 20 MG PO TABS: 20.0000 mg | ORAL_TABLET | Freq: Every day | ORAL | Status: DC

## 2016-03-23 MED ORDER — POTASSIUM CHLORIDE ER 10 MEQ PO TBCR: 10.0000 meq | EXTENDED_RELEASE_TABLET | Freq: Every day | ORAL | Status: DC

## 2016-03-23 MED ORDER — CARVEDILOL 3.125 MG PO TABS: 3.1250 mg | ORAL_TABLET | Freq: Two times a day (BID) | ORAL | Status: DC

## 2016-03-23 MED ORDER — ASPIRIN 81 MG PO TBEC: 81.0000 mg | DELAYED_RELEASE_TABLET | Freq: Every day | ORAL | Status: DC

## 2016-03-23 MED FILL — Heparin Sodium (Porcine) Inj 1000 Unit/ML: INTRAMUSCULAR | Qty: 10 | Status: AC

## 2016-03-23 NOTE — Discharge Instructions (Signed)

## 2016-03-23 NOTE — Progress Notes (Signed)
Patient Name: Mandy Rhodes Date of Encounter: 03/23/2016  Hospital Problem List     Principal Problem:   Acute systolic congestive heart failure, NYHA class 3 (HCC) Active Problems:   Diabetes mellitus (HCC)   Cardiomegaly   CKD (chronic kidney disease) stage 2, GFR 60-89 ml/min   Elevated troponin   Essential hypertension   DM type 2 causing CKD stage 2 (HCC)   Elevated troponin I level   Cough   CHF (congestive heart failure) (HCC)   CKD (chronic kidney disease)    Subjective   Feeling well. No recurrent episodes of chest pain.    Inpatient Medications    . aspirin EC  81 mg Oral Daily  . captopril  12.5 mg Oral BID  . carvedilol  3.125 mg Oral BID WC  . enoxaparin (LOVENOX) injection  40 mg Subcutaneous Q24H  . ferrous sulfate  650 mg Oral Daily  . insulin aspart  0-15 Units Subcutaneous TID WC  . insulin aspart  0-5 Units Subcutaneous QHS  . nortriptyline  10 mg Oral QHS  . pantoprazole  40 mg Oral Daily  . pravastatin  40 mg Oral q1800  . sodium chloride flush  3 mL Intravenous Q12H  . sodium chloride flush  3 mL Intravenous Q12H    Vital Signs    Filed Vitals:   03/22/16 1405 03/22/16 1500 03/22/16 2020 03/23/16 0403  BP: 138/74 130/70 107/61 96/59  Pulse: 76 68 71 69  Temp:  98.6 F (37 C) 98.2 F (36.8 C) 98 F (36.7 C)  TempSrc:  Oral Oral Oral  Resp: 17 20 20 20   Height:      Weight:    212 lb 8.4 oz (96.4 kg)  SpO2: 99% 98% 100% 100%    Intake/Output Summary (Last 24 hours) at 03/23/16 0813 Last data filed at 03/23/16 0403  Gross per 24 hour  Intake    200 ml  Output    250 ml  Net    -50 ml   Filed Weights   03/21/16 0500 03/22/16 0453 03/23/16 0403  Weight: 214 lb 11.7 oz (97.4 kg) 217 lb 6 oz (98.6 kg) 212 lb 8.4 oz (96.4 kg)    Physical Exam    General: Pleasant, NAD. Neuro: Alert and oriented X 3. Moves all extremities spontaneously. Psych: Normal affect. HEENT:  Normal  Neck: Supple without bruits or JVD. Lungs:  Resp  regular and unlabored, CTA. Heart: RRR no s3, s4, or murmurs. Abdomen: Soft, non-tender, non-distended, BS + x 4.  Extremities: No clubbing, cyanosis or edema. DP/PT/Radials 2+ and equal bilaterally. Right wrist with no bruising or hematoma. Slight swelling noted to the right hand.   Labs    CBC  Recent Labs  03/21/16 0525  WBC 4.1  HGB 11.1*  HCT 33.1*  MCV 87.3  PLT 0000000   Basic Metabolic Panel  Recent Labs  03/21/16 0525 03/22/16 0504  NA 141 143  K 4.0 4.0  CL 106 108  CO2 28 27  GLUCOSE 122* 113*  BUN 49* 46*  CREATININE 1.80* 1.57*  CALCIUM 9.8 9.7    Telemetry    SR Rate-70s  ECG    No morning EKG  Radiology     Assessment & Plan    Mandy Rhodes is a 75 y.o. female with a history of IDDM, CKD 3, HTN, and anemia who presented to [facility] on 03/17/16 with cough and orthopnea. Her troponin was noted to be elevated and cardiology  was consulted, she went to [facility] yesterday.   New acute combined S/D CHF:  -- Presented with cough and orthopnea. BNP mildly elevated and CXR with cardiomegaly without overt edema. -- 2D ECHO with newly reduced EF (35-40%), severe LVH with bright interstitial septum - ?infiltrative cardiomyopathy such as amyloid, Grade 2 DD, moderate LAE. -- Started on IV lasix, net neg 1165mL. Weight down 4lbs since admission. Appears clinically euvolemic and creat better this morning.  -- Due to CKD, she underwent lexiscan myoview instead of cath which showed possible inducible ischemia --LHC yesterday  Conclusion     Prox LAD lesion, 20% stenosed.  Ost Ramus to Ramus lesion, 10% stenosed.  1. Minor nonobstructive CAD  Plan: medical management.   -- Continue BB, statin, ASA and ACE.  --BP is soft this morning, will hold captopril dose this am.  --Doing well post-cath, stable from cardiac stand point.  -- Follow up appt scheduled.  Elevated troponin: c/w demand in the setting of acute CHF. Myoview showed possible inducible ischemia.  Of note, pt says she had a heart cath over 10 years ago in [place] that did have a blockage but nothing to fix. LHC showed minor obstructive CAD, with plans for medical management.   Acute on CKD: creat up to 2.08, now down to 1.57 yesterday. Lasix currently held. Will check morning BMET before adding low dose lasix 20mg  daily.   HTN: BP soft this am. ACE held this morning due soft BP.   Signed, Reino Bellis NP-C Pager [phone]   Patient seen and examined. Agree with assessment and plan. BUN 33/Cr 1.37 today.  No chest pain. Radial and groin sites are stable. EF 35-40%. Medical therapy. Consider future cardiac MRI to further evaluate ? Infiltrative cardiomyopathy of septum.   [name], MD, [facility] 03/23/2016 2:44 PM

## 2016-03-23 NOTE — Discharge Summary (Signed)
PATIENT DETAILS Name: Mandy Rhodes Age: 75 y.o. Sex: female Date of Birth: 07-Jul-1941 MRN: [id number]. Admitting Physician: Toy Baker, MD [id number] S, MD  Admit Date: 03/17/2016 Discharge date: 03/23/2016  Recommendations for Outpatient Follow-up:  1. Ensure follow-up with cardiology-new diagnosis of  systolic heart failure  2. New medications-Coreg, Lasix  3. Please repeat CBC/BMET at next visit  PRIMARY DISCHARGE DIAGNOSIS:  Principal Problem:   Acute systolic congestive heart failure, NYHA class 3 (HCC) Active Problems:   Diabetes mellitus (HCC)   Cardiomegaly   CKD (chronic kidney disease) stage 2, GFR 60-89 ml/min   Elevated troponin   Essential hypertension   DM type 2 causing CKD stage 2 (HCC)   Elevated troponin I level   Cough   CHF (congestive heart failure) (HCC)   CKD (chronic kidney disease)      PAST MEDICAL HISTORY: Past Medical History  Diagnosis Date  . Anemia   . Hypertension   . History of spinal stenosis   . History of rotator cuff tear     Torn right rotator cuff.  . Neuropathy associated with endocrine disorder ([place])   . Diabetes mellitus     type 2  . Chronic kidney disease     Renal Insuffiency, see [facility] once a year  . GERD (gastroesophageal reflux disease)   . Arthritis     DISCHARGE MEDICATIONS: Current Discharge Medication List    START taking these medications   Details  aspirin EC 81 MG EC tablet Take 1 tablet (81 mg total) by mouth daily. Qty: 30 tablet, Refills: 0    carvedilol (COREG) 3.125 MG tablet Take 1 tablet (3.125 mg total) by mouth 2 (two) times daily with a meal. Qty: 60 tablet, Refills: 0    furosemide (LASIX) 20 MG tablet Take 1 tablet (20 mg total) by mouth daily. Qty: 30 tablet, Refills: 0    potassium chloride (K-DUR) 10 MEQ tablet Take 1 tablet (10 mEq total) by mouth daily. Qty: 30 tablet, Refills: 0      CONTINUE these medications which have CHANGED   Details    captopril (CAPOTEN) 12.5 MG tablet Take 1 tablet (12.5 mg total) by mouth 2 (two) times daily. Qty: 60 tablet, Refills: 0      CONTINUE these medications which have NOT CHANGED   Details  Calcium Carbonate-Vit D-Min (CALCIUM 1200 PO) Take 600 mg by mouth daily.     cholecalciferol (VITAMIN D) 1000 UNITS tablet Take 1,000 Units by mouth daily.     docusate sodium (COLACE) 100 MG capsule Take 100 mg by mouth daily as needed for mild constipation.    ferrous sulfate 325 (65 FE) MG EC tablet Take 325 mg by mouth daily.     glimepiride (AMARYL) 1 MG tablet Take 1 mg by mouth 2 (two) times daily.     Multiple Vitamin (MULTIVITAMIN) tablet Take 1 tablet by mouth daily.      nortriptyline (PAMELOR) 10 MG capsule Take 10 mg by mouth at bedtime.    omeprazole (PRILOSEC) 40 MG capsule Take 40 mg by mouth daily.      pravastatin (PRAVACHOL) 40 MG tablet Take 40 mg by mouth daily.      traMADol (ULTRAM) 50 MG tablet Take 50 mg by mouth every 6 (six) hours as needed for moderate pain.  Refills: 0    PRESCRIPTION MEDICATION 1 drop every 3 (three) months. Eye drops to be used the day before, of , and after eye injections  STOP taking these medications     amLODipine (NORVASC) 5 MG tablet      hydrochlorothiazide (HYDRODIURIL) 25 MG tablet      acetaminophen-codeine (TYLENOL #3) 300-30 MG per tablet      ibuprofen (ADVIL,MOTRIN) 200 MG tablet         ALLERGIES:   Allergies  Allergen Reactions  . Sulfa Antibiotics Swelling    BRIEF HPI:  See H&P, Labs, Consult and Test reports for all details in brief, Patient is a 75 y.o. female is an 75 y.o. female past medical history of diabetes mellitus chronic kidney disease stage 3, with a baseline creatinine of 1.6-2.0, hypertension -further evaluation revealed acute systolic heart failure.  CONSULTATIONS:   cardiology  PERTINENT RADIOLOGIC STUDIES: Dg Chest 2 View  03/17/2016  CLINICAL DATA:  Productive cough. EXAM: CHEST  2  VIEW COMPARISON:  No recent. FINDINGS: Cardiomegaly with normal pulmonary vascularity. No focal infiltrate. No pleural effusion or pneumothorax. No acute bony abnormality . IMPRESSION: Cardiomegaly. No overt congestive heart failure. Exam otherwise unremarkable. Electronically Signed   By: Marcello Moores  Register   On: 03/17/2016 16:35   Nm Myocar Multi W/spect W/wall Motion / Ef  03/20/2016  CLINICAL DATA:  CHF, hypertension, diabetes EXAM: MYOCARDIAL IMAGING WITH SPECT (REST AND PHARMACOLOGIC-STRESS) GATED LEFT VENTRICULAR WALL MOTION STUDY LEFT VENTRICULAR EJECTION FRACTION TECHNIQUE: Standard myocardial SPECT imaging was performed after resting intravenous injection of 10 mCi Tc-56m sestamibi. Subsequently, intravenous infusion of Lexiscan was performed under the supervision of the Cardiology staff. At peak effect of the drug, 30 mCi Tc-11m sestamibi was injected intravenously and standard myocardial SPECT imaging was performed. Quantitative gated imaging was also performed to evaluate left ventricular wall motion, and estimate left ventricular ejection fraction. COMPARISON:  None. FINDINGS: Perfusion: There is decreased activity noted inferiorly on rest images, worsening on stress images. No other areas of reversibility. Wall Motion: Decreased wall motion noted inferiorly. This segment does thickened. Mild generalized hypokinesia. Left Ventricular Ejection Fraction: 35 % End diastolic volume 0000000 ml End systolic volume 123XX123 ml IMPRESSION: 1. Decreased activity inferiorly on rest images, which worsens on stress images. This is concerning for back from I continue a shin with superimposed inducible ischemia. 2. Mild generalized hypokinesia. More pronounced decreased wall abnormality motion inferiorly. 3. Left ventricular ejection fraction 35% 4. High-risk stress test findings*. *2012 Appropriate Use Criteria for Coronary Revascularization Focused Update: J Am Coll Cardiol. [id number].  http://content.airportbarriers.com.aspx?articleid=[email or web address] Electronically Signed   By: Rolm Baptise M.D.   On: 03/20/2016 12:58     PERTINENT LAB RESULTS: CBC:  Recent Labs  03/21/16 0525  WBC 4.1  HGB 11.1*  HCT 33.1*  PLT 178   CMET CMP     Component Value Date/Time   NA 137 03/23/2016 0929   NA 139 01/13/2015 1016   K 4.4 03/23/2016 0929   K 4.2 01/13/2015 1016   CL 108 03/23/2016 0929   CL 108* 12/27/2012 0849   CO2 22 03/23/2016 0929   CO2 24 01/13/2015 1016   GLUCOSE 165* 03/23/2016 0929   GLUCOSE 106 01/13/2015 1016   GLUCOSE 108* 12/27/2012 0849   BUN 33* 03/23/2016 0929   BUN 32.8* 01/13/2015 1016   CREATININE 1.37* 03/23/2016 0929   CREATININE 2.1* 01/13/2015 1016   CALCIUM 9.7 03/23/2016 0929   CALCIUM 9.9 01/13/2015 1016   PROT 7.2 03/17/2016 1818   PROT 7.0 01/13/2015 1016   ALBUMIN 4.0 03/17/2016 1818   ALBUMIN 3.7 01/13/2015 1016   AST 21 03/17/2016 1818  AST 17 01/13/2015 1016   ALT 20 03/17/2016 1818   ALT 13 01/13/2015 1016   ALKPHOS 76 03/17/2016 1818   ALKPHOS 74 01/13/2015 1016   BILITOT 0.6 03/17/2016 1818   BILITOT 0.45 01/13/2015 1016   GFRNONAA 37* 03/23/2016 0929   GFRAA 43* 03/23/2016 0929    GFR Estimated Creatinine Clearance: 42.1 mL/min (by C-G formula based on Cr of 1.37). No results for input(s): LIPASE, AMYLASE in the last 72 hours. No results for input(s): CKTOTAL, CKMB, CKMBINDEX, TROPONINI in the last 72 hours. Invalid input(s): POCBNP No results for input(s): DDIMER in the last 72 hours. No results for input(s): HGBA1C in the last 72 hours. No results for input(s): CHOL, HDL, LDLCALC, TRIG, CHOLHDL, LDLDIRECT in the last 72 hours. No results for input(s): TSH, T4TOTAL, T3FREE, THYROIDAB in the last 72 hours.  Invalid input(s): FREET3 No results for input(s): VITAMINB12, FOLATE, FERRITIN, TIBC, IRON, RETICCTPCT in the last 72 hours. Coags: No results for input(s): INR in the last 72 hours.  Invalid input(s):  PT Microbiology: No results found for this or any previous visit (from the past 240 hour(s)).   BRIEF HOSPITAL COURSE:  Acute systolic congestive heart failure: EF around 35-40%, diuresed with IV Lasix. Much more compensated. Cardiology consulted, underwent nuclear stress test that was positive for ischemia-however cardiac catheterization on 5/3-showed nonobstructive CAD.By day of discharge, negative balance was 2 L, weight decreased to 212 pounds (216 pounds on admission). No further recommendations from cardiology, will continue low-dose Lasix on discharge along with Coreg and captopril. Will require further optimization of these medications on follow-up with either PCP or cardiology.  Active Problems: Nonobstructive CAD: LHC on 5/3 showed 20% proximal LAD and 10% Ramus stenosis.Plan to manage medically.  Minimally elevated troponin: Likely secondary to acute CHF, LHC as above.  AKI on chronic kidney disease stage III: Improving and creatinine now close to usual baseline-please continue to monitor electrolytes very closely well on Lasix and captopril.  Hypertension: Controlled, continue captopril and Coreg  Type 2 diabetes: CBGs stable with SSI-resume. A1c 6.9  TODAY-DAY OF DISCHARGE:  Subjective:   Kamauri Gilmore today has no headache,no chest abdominal pain,no new weakness tingling or numbness, feels much better wants to go home today.   Objective:   Blood pressure 96/59, pulse 69, temperature 98 F (36.7 C), temperature source Oral, resp. rate 20, height 5\' 6"  (1.676 m), weight 96.4 kg (212 lb 8.4 oz), SpO2 100 %.  Intake/Output Summary (Last 24 hours) at 03/23/16 1331 Last data filed at 03/23/16 1257  Gross per 24 hour  Intake    560 ml  Output    400 ml  Net    160 ml   Filed Weights   03/21/16 0500 03/22/16 0453 03/23/16 0403  Weight: 97.4 kg (214 lb 11.7 oz) 98.6 kg (217 lb 6 oz) 96.4 kg (212 lb 8.4 oz)    Exam Awake Alert, Oriented *3, No new F.N deficits, Normal  affect [place].AT,PERRAL Supple Neck,No JVD, No cervical lymphadenopathy appriciated.  Symmetrical Chest wall movement, Good air movement bilaterally, CTAB RRR,No Gallops,Rubs or new Murmurs, No Parasternal Heave +ve B.Sounds, Abd Soft, Non tender, No organomegaly appriciated, No rebound -guarding or rigidity. No Cyanosis, Clubbing or edema, No new Rash or bruise  DISCHARGE CONDITION: Stable  DISPOSITION: Home  DISCHARGE INSTRUCTIONS:    Activity:  As tolerated   Get Medicines reviewed and adjusted: Please take all your medications with you for your next visit with your Primary MD  Please request  your Primary MD to go over all hospital tests and procedure/radiological results at the follow up, please ask your Primary MD to get all Hospital records sent to his/her office.  If you experience worsening of your admission symptoms, develop shortness of breath, life threatening emergency, suicidal or homicidal thoughts you must seek medical attention immediately by calling 911 or calling your MD immediately  if symptoms less severe.  You must read complete instructions/literature along with all the possible adverse reactions/side effects for all the Medicines you take and that have been prescribed to you. Take any new Medicines after you have completely understood and accpet all the possible adverse reactions/side effects.   Do not drive when taking Pain medications.   Do not take more than prescribed Pain, Sleep and Anxiety Medications  Special Instructions: If you have smoked or chewed Tobacco  in the last 2 yrs please stop smoking, stop any regular Alcohol  and or any Recreational drug use.  Wear Seat belts while driving.  Please note  You were cared for by a hospitalist during your hospital stay. Once you are discharged, your primary care physician will handle any further medical issues. Please note that NO REFILLS for any discharge medications will be authorized once you are  discharged, as it is imperative that you return to your primary care physician (or establish a relationship with a primary care physician if you do not have one) for your aftercare needs so that they can reassess your need for medications and monitor your lab values.   Diet recommendation: Diabetic Diet Heart Healthy diet  Discharge Instructions    (HEART FAILURE PATIENTS) Call MD:  Anytime you have any of the following symptoms: 1) 3 pound weight gain in 24 hours or 5 pounds in 1 week 2) shortness of breath, with or without a dry hacking cough 3) swelling in the hands, feet or stomach 4) if you have to sleep on extra pillows at night in order to breathe.    Complete by:  As directed      AMB Referral to [place] Management    Complete by:  As directed   Please assign to [place] for CHF and DM management and teaching. [facility] Licensed CSW to please assist with mobile meals program with [facility]. Written consent obtained. Please call with questions. Thanks. Marthenia Rolling, [place], RN,BSN-[facility] Liaison-[phone]  Reason for consult:  Please assign to [place] and [facility] LCSW  Diagnoses of:   Heart Failure Diabetes    Expected date of contact:  1-3 days (reserved for hospital discharges)     Diet - low sodium heart healthy    Complete by:  As directed      Diet Carb Modified    Complete by:  As directed      Heart Failure patients record your daily weight using the same scale at the same time of day    Complete by:  As directed      Increase activity slowly    Complete by:  As directed            Follow-up Information    Follow up with [place].   Why:  [facility]   Contact information:   [place] [address] [phone]       Follow up with Tarri Fuller, PA-C On 04/13/2016.   Specialties:  Physician Assistant, Radiology, Interventional Cardiology   Why:  2:30pm with Dr. Evette Georges PA for hospital follow  up.    Contact information:     [place] [address] [phone]       Follow up with Vidal Schwalbe, MD. Schedule an appointment as soon as possible for a visit in 1 week.   Specialty:  Family Medicine   Why:  Hospital follow up   Contact information:   [address] [place] [address] [phone]      Total Time spent on discharge equals 45 minutes.  SignedOren Binet 03/23/2016 1:31 PM

## 2016-03-23 NOTE — Care Management Note (Signed)
Case Management Note  Patient Details  Name: Mandy Rhodes MRN: [id number] Date of Birth: 12/20/1940  Subjective/Objective:                    Action/Plan:d/c home w/HHC.   Expected Discharge Date:                  Expected Discharge Plan:  [place]  In-House Referral:  NA  Discharge planning Services  CM Consult  Post Acute Care Choice:  NA Choice offered to:  NA  DME Arranged:  N/A DME Agency:  NA  HH Arranged:  NA, RN [place] Agency:  NA, [place]  Status of Service:  Completed, signed off  Medicare Important Message Given:    Date Medicare IM Given:    Medicare IM give by:    Date Additional Medicare IM Given:    Additional Medicare Important Message give by:     If discussed at [place] of Stay Meetings, dates discussed:    Additional Comments:  Dessa Phi, RN 03/23/2016, 1:42 PM

## 2016-03-24 ENCOUNTER — Other Ambulatory Visit: Payer: Self-pay | Admitting: *Deleted

## 2016-03-24 NOTE — Patient Outreach (Signed)
Telephone call made to Ms. Mandy Rhodes at [phone] to confirm her H number with her [facility] insurance as her insurance card was not in the system. Ms. Mandy Rhodes confirms her number on her [employer] [phone]. Ms. Mandy Rhodes is eligible for [place] [facility] services. Made [facility] Care Management office staff aware.  Mandy Rolling, MSN-Ed, RN,BSN [facility] Liaison [phone]

## 2016-03-24 NOTE — Patient Outreach (Signed)
Referral received from hospital liaison to start transition of care program once member discharged.  Member recently admitted (4/28-5/4) for elevated troponin and congestive heart failure.  According to chart, she also has history of diabetes, hypertension, and chronic kidney disease.  Call placed to member to begin program, no answer [phone]).  Unable to leave a voice message as there was no voicemail set up. Will make second attempt to contact member next week.  Valente David, BSN, [place] Management  [facility] Care Manager [phone]

## 2016-03-24 NOTE — Patient Outreach (Signed)
[facility]) Care Management  03/24/2016  Mandy Rhodes [birth year] [id number]   Request received from [facility], LCSW and Marthenia Rolling, RN to arrange mobile meals. Referral made to Wanda Plump at [facility] on 03/24/16. Waiting on return call for confirmation of start date for meal delivery.   [name]  [facility] Care Management Assistant

## 2016-03-27 ENCOUNTER — Encounter: Payer: Self-pay | Admitting: *Deleted

## 2016-03-27 ENCOUNTER — Other Ambulatory Visit: Payer: Self-pay | Admitting: *Deleted

## 2016-03-27 NOTE — Patient Outreach (Signed)
Second attempt made to contact member and start transition of care program.  This care manager introduced self, [facility] care management explained.  She state she spoke with someone from mobile meals today, will start receiving delivered meals.  Noted that the [facility] social worker, J. Saporito, also contacted the member today, member denies speaking with anyone except someone from mobile meals.  Informed that Mrs. Saporito may have been the person to contact her regarding the meals.  She verbalizes understanding.  Member state she is feeling "much better."  She state that she originally thought she was having trouble with asthma and went to her PCP, after which she was hospitalized for congestive heart failure.  She denies having a previous history of heart failure, but confirms that she did receive education from the hospital.  Heart failure zones discussed, she state that she is in the green zone today.  She does have a working scale, however she state that it need to be calibrated as it does not read in pounds.  She state that she does not know the unit that it shows, but she has still been weighing herself, reporting that "the number has been going down so I know I'm not retaining any fluid."  She state she will have her son calibrate it back to pounds.  If this is impossible, she will purchase a new one.  She reports taking all medications as prescribed.  Patient was recently discharged from hospital and all medications have been reviewed.  She confirms home health involvement through [place].  She state she will receive weekly visits, but does not now how long.  She denies any urgent concerns at this time.  Encouraged to contact for concerns.  Initial home visit scheduled for next week.  Valente David, BSN, [place] Management  [facility] Care Manager [phone]

## 2016-03-27 NOTE — Patient Outreach (Signed)
[facility]) Care Management  03/27/2016  TACHINA GOLDSBY 07/25/1941 [id number]   Request received from Nat Christen, LCSW to mail patient food resource information. Information mailed today. Also, confirmation received from [employer] [place] that referral for meals on wheels transition meals was received and will begin on 03/28/16. [facility] [employer] will contact patient for confirmation.

## 2016-03-27 NOTE — Patient Outreach (Signed)
[facility]) Care Management  03/27/2016  [name] 1940/11/30 [id number]  CSW was able to make initial contact with patient today to perform phone assessment, as well as assess and assist with social work needs and services.  CSW introduced self, explained role and types of services provided through [place] Management ([place] [facility]).  CSW further explained to patient that CSW works with patient's RNCM, also with [place] [facility], Valente David. CSW then explained the reason for the call, indicating that [name] thought that patient would benefit from social work services and resources to assist with obtaining food and food resources.  CSW obtained two HIPAA compliant identifiers from patient, which included patient's name and date of birth. Patient admits that she needs assistance with preparing meals, at least for the first two weeks, post hospital discharge.  CSW submitted an In Basket message to Josepha Pigg, Care Management Assistant with [place] Management, requesting that she mail patient a packet of resource information, as well as make a referral for patient to the [employer] Program, courtesy of [place] Management.  This will entitle patient to ten days worth of free meals, at the expense of [facility] Care Management.  The packet of resource information will include the following information:  The [place] in [place] in [place] for [employer] through [employer] for [place] through the [place] will perform a case closure on patient, as all goals of treatment have been met from social work standpoint and no additional social work needs have been identified at this time. CSW will notify patient's RNCM with [place] Management, Valente David of CSW's plans to close patient's case. CSW will fax a correspondence letter to patient's Primary Care Physician, Dr. Harlan Stains to ensure that Dr. Dema Severin is aware of CSW's case closure plans.   CSW will submit a case closure request to Lurline Del, Care Management Assistant with [place] Management, in the form of an In [employer].  CSW will ensure that [name] is aware of [name], RNCM with [place] Management, continued involvement with patient's care. Nat Christen, BSW, MSW, LCSW  Licensed Education officer, environmental Health System  Mailing [place] [address] [place], [place] [address] Physical Address-[address], [place], [place] [address] Toll Free Main # [phone] Fax # [phone] Cell # [phone]  Fax # [phone]  [email or web address] Patient's preferred language:  [place]   English  ATTENTION:  If you speak English, language assistance services, free of charge, are available to you.    Nondiscrimination and Accessibility Statement: Discrimination is Against the [employer], a subsidiary of [employer], complies with [employer] civil rights laws and does not discriminate on the basis of race, color, national origin, age, disability, or sex.  [place] does not exclude people or treat them differently because of race, color, national origin, age, disability, or sex.  [place] Providers will:  . Provide free aids and services to people with disabilities to communicate effectively with [place], such as:     ? Qualified sign language interpreters  ? Written information in other formats (large print, audio, accessible electronic formats, other formats)   . Provide free language services  to people whose primary language is not [place], such as:    ? Qualified interpreters     ? Information written in other languages   If you need these services, contact your [facility] psychologist.  If you believe that a [employer] has failed to provide these services or discriminated in another way on the basis of race, color, national origin, age, disability, or sex, you can file a Tourist information centre manager with: [place], [phone] or [email or web address].info/.  You can file a grievance in person or by mail, fax, or email. If you need help filing a grievance, you may contact Valrie Hart, Interim Compliance Officer, [facility] Department of Compliance and Integrity, [address]nd Floor, [place], [place]. [place], [phone], [email or web address].    You can also file a civil rights complaint with the U.S. Department of Health and Financial controller, Office for [employer], electronically through the Office for Civil Rights Complaint Portal, available at OnSiteLending.nl.jsf, or by mail or phone at:  [place]. Department of Health and Human Services [address] [place] Room [phone], [facility] [place], [place]  [phone], [phone] (TDD) Complaint forms are available at CutFunds.si.

## 2016-04-04 ENCOUNTER — Other Ambulatory Visit: Payer: Self-pay | Admitting: *Deleted

## 2016-04-04 ENCOUNTER — Encounter: Payer: Self-pay | Admitting: *Deleted

## 2016-04-04 NOTE — Patient Outreach (Signed)
[facility]) Care Management   04/04/2016  CHRISTELL STEINMILLER 02/20/1941 [id number]  CASS EDINGER is an 75 y.o. female  Subjective:   Member reports that she is "doing pretty good."  She reports that she has had a follow up appointment with her PCP, and will have one with her cardiologist next week.  She report that she is taking all medications as prescribed, uses a pill box for management.  She state that she has obtained a new scale, but still doubt the accuracy. Reports that she is still receiving visits from [place], but state they will be complete this week.  Objective:   Review of Systems  Constitutional: Negative.   HENT: Negative.   Eyes: Negative.   Respiratory: Negative.   Cardiovascular: Positive for leg swelling.  Gastrointestinal: Negative.   Genitourinary: Negative.   Musculoskeletal: Negative.   Skin: Negative.   Neurological: Negative.   Endo/Heme/Allergies: Negative.   Psychiatric/Behavioral: Negative.     Physical Exam  Constitutional: She is oriented to person, place, and time. She appears well-developed and well-nourished.  Neck: Normal range of motion.  Cardiovascular: Normal rate, regular rhythm and normal heart sounds.   Respiratory: Effort normal and breath sounds normal.  GI: Soft. Bowel sounds are normal.  Musculoskeletal: Normal range of motion.  Neurological: She is alert and oriented to person, place, and time.  Skin: Skin is warm and dry.    BP 112/66 mmHg  Pulse 62  Resp 18  Ht 1.676 m (_0 )  Wt 208 lb (94.348 kg)  BMI 33.59 kg/m2  SpO2 96%   Encounter Medications:   Outpatient Encounter Prescriptions as of 04/04/2016  Medication Sig Note  . aspirin EC 81 MG EC tablet Take 1 tablet (81 mg total) by mouth daily.   . Calcium Carbonate-Vit D-Min (CALCIUM 1200 PO) Take 600 mg by mouth daily.    . captopril (CAPOTEN) 12.5 MG tablet Take 1 tablet (12.5 mg total) by mouth 2 (two) times daily.   . carvedilol  (COREG) 3.125 MG tablet Take 1 tablet (3.125 mg total) by mouth 2 (two) times daily with a meal.   . cholecalciferol (VITAMIN D) 1000 UNITS tablet Take 1,000 Units by mouth daily. Reported on 03/27/2016   . docusate sodium (COLACE) 100 MG capsule Take 100 mg by mouth daily as needed for mild constipation.   . ferrous sulfate 325 (65 FE) MG EC tablet Take 325 mg by mouth daily.    . furosemide (LASIX) 20 MG tablet Take 1 tablet (20 mg total) by mouth daily.   Marland Kitchen glimepiride (AMARYL) 1 MG tablet Take 1 mg by mouth 2 (two) times daily.    . Multiple Vitamin (MULTIVITAMIN) tablet Take 1 tablet by mouth daily.     . nortriptyline (PAMELOR) 10 MG capsule Take 10 mg by mouth at bedtime.   Marland Kitchen omeprazole (PRILOSEC) 40 MG capsule Take 40 mg by mouth daily.     . potassium chloride (K-DUR) 10 MEQ tablet Take 1 tablet (10 mEq total) by mouth daily.   . pravastatin (PRAVACHOL) 40 MG tablet Take 40 mg by mouth daily.     Marland Kitchen PRESCRIPTION MEDICATION 1 drop every 3 (three) months. Eye drops to be used the day before, of , and after eye injections 03/17/2016: Next injection due on 03/24/16.  Marland Kitchen traMADol (ULTRAM) 50 MG tablet Take 50 mg by mouth every 6 (six) hours as needed for moderate pain.     No facility-administered encounter medications on  file as of 04/04/2016.    Functional Status:   In your present state of health, do you have any difficulty performing the following activities: 03/27/2016 03/18/2016  Hearing? N -  Vision? N -  Difficulty concentrating or making decisions? N -  Walking or climbing stairs? N -  Dressing or bathing? N -  Doing errands, shopping? N N  Preparing Food and eating ? Y -  Using the Toilet? N -  In the past six months, have you accidently leaked urine? N -  Do you have problems with loss of bowel control? N -  Managing your Medications? N -  Managing your Finances? Y -  Housekeeping or managing your Housekeeping? Y -    Fall/Depression Screening:    PHQ 2/9 Scores 03/27/2016  PHQ  - 2 Score 0   Fall Risk  03/27/2016  Falls in the past year? Yes  Number falls in past yr: 2 or more  Injury with Fall? Yes  Risk Factor Category  High Fall Risk  Risk for fall due to : Impaired balance/gait;Impaired mobility  Follow up Education provided;Falls prevention discussed     Assessment:    Member confirms that she has congestive heart failure education from hospital, zones explained and discussed.  She verbalizes understanding of zones and when to notify physician.  Encouraged to continue using the scale that she has and compare her weights on a daily basis, notifying physician with significant change.  She state she will also compare her weight from office visit to office visit (their scale) to determine fluid status.  Discussed proper diet, including daily fluid intake and sodium intake, along with portion control and serving sizes.  Provided with handout on proper vs improper foods for low salt diet.  Provided with [facility] calendar book, encourage to use for daily weights.  Instructed to notify this care manager if she continues to have fluctuation of weights and [facility] will provide new scale.  She denies any other concerns at this time, provided with contact information.  Encouraged to contact with questions.  Plan:   Will continue with weekly transition of care calls next week.  [facility] CM Care Plan Problem One        Most Recent Value   Care Plan Problem One  Recent hospitalization   Role Documenting the Problem One  Care Management Coordinator   Care Plan for Problem One  Active   [facility] Long Term Goal (31-90 days)  Member will not be readmitted to hospital within the next 31 days   [facility] Long Term Goal Start Date  03/27/16   Interventions for Problem One Long Term Goal  Discussed with member the importance of following discharge instructions, including follow up appointments, medications, diet, and home health involvement, to decrease the risk of readmission   [facility] CM Short Term Goal  #1 (0-30 days)  Member will have follow up appointment within the next 2 weeks   [facility] CM Short Term Goal #1 Start Date  03/27/16   [facility] CM Short Term Goal #1 Met Date  04/04/16 [facility] appointment complete, Cardiology appointment 5/25]   Interventions for Short Term Goal #1  Appointment confirmed for PCP on 5/9, confirmed for cardiology for 5/25, transportation confirmed (will use [facility])   [facility] CM Short Term Goal #2 (0-30 days)  Member will report taking all medications as prescribed over the next 4 weeks   [facility] CM Short Term Goal #2 Start Date  03/27/16   Interventions for Short Term  Goal #2  Medications reviewed, only need refill for Vitamin D   [facility] CM Short Term Goal #3 (0-30 days)  Member will report weighing self and recording readings daily over the next 4 weeks   [facility] CM Short Term Goal #3 Start Date  03/27/16   Interventions for Short Tern Goal #3  Confirmed member does have working scale, however it does not record in pounds.  Will have son calibrate or buy new one     Valente David, BSN, [place] Manager [phone]

## 2016-04-05 ENCOUNTER — Encounter: Payer: Self-pay | Admitting: *Deleted

## 2016-04-11 ENCOUNTER — Ambulatory Visit: Payer: Self-pay | Admitting: *Deleted

## 2016-04-13 ENCOUNTER — Encounter: Payer: Self-pay | Admitting: Physician Assistant

## 2016-04-13 ENCOUNTER — Ambulatory Visit (INDEPENDENT_AMBULATORY_CARE_PROVIDER_SITE_OTHER): Payer: Medicare Other | Admitting: Physician Assistant

## 2016-04-13 VITALS — BP 94/60 | HR 80 | Ht 66.0 in | Wt 222.0 lb

## 2016-04-13 DIAGNOSIS — I5042 Chronic combined systolic (congestive) and diastolic (congestive) heart failure: Secondary | ICD-10-CM

## 2016-04-13 DIAGNOSIS — N189 Chronic kidney disease, unspecified: Secondary | ICD-10-CM | POA: Diagnosis not present

## 2016-04-13 DIAGNOSIS — I10 Essential (primary) hypertension: Secondary | ICD-10-CM | POA: Diagnosis not present

## 2016-04-13 DIAGNOSIS — I5043 Acute on chronic combined systolic (congestive) and diastolic (congestive) heart failure: Secondary | ICD-10-CM | POA: Insufficient documentation

## 2016-04-13 MED ORDER — CARVEDILOL 3.125 MG PO TABS: 3.1250 mg | ORAL_TABLET | Freq: Two times a day (BID) | ORAL | Status: DC

## 2016-04-13 MED ORDER — POTASSIUM CHLORIDE ER 10 MEQ PO TBCR: 10.0000 meq | EXTENDED_RELEASE_TABLET | Freq: Every day | ORAL | Status: DC

## 2016-04-13 MED ORDER — FUROSEMIDE 20 MG PO TABS: 20.0000 mg | ORAL_TABLET | Freq: Every day | ORAL | Status: DC

## 2016-04-13 MED ORDER — CAPTOPRIL 12.5 MG PO TABS: 12.5000 mg | ORAL_TABLET | Freq: Two times a day (BID) | ORAL | Status: DC

## 2016-04-13 NOTE — Patient Instructions (Addendum)
Weigh daily about the same time each day in about the same amount of clothes in the morning.  If you gain 3lbs in 24 hours or 5lbs in one week --- then take lasix 40mg  daily until weight comes down -- if you take extra lasix, take an extra 91mEq potassium -- otherwise continue taking lasix 20mg  every other day  [place] recommends that you purchase a new scale.   Refills have been sent to [facility]  Your physician recommends that you schedule a follow-up appointment in: [place] with Dr. Debara Pickett

## 2016-04-13 NOTE — Progress Notes (Signed)
Patient ID: Mandy Rhodes, female   DOB: 11-13-1941, 75 y.o.   MRN: [id number]    Date:  04/13/2016   ID:  Mandy Rhodes, DOB March 15, 1941, MRN [id number]  PCP:  Vidal Schwalbe, MD  Primary Cardiologist:  [facility]  Chief Complaint  Patient presents with  . Follow-up    some swelling in ankles, occ cramping     History of Present Illness: Mandy Rhodes is a 75 y.o. female  was recently hospitalized for acute combined systolic and diastolic heart failure. Echocardiogram revealed a newly reduced EF of 35-40% with severe LVH was started on IV Lasix and diuresed. Her left heart catheterization revealing a 20% proximal LAD lesion Percent ostial ramus. Echocardiogram was suggestive of infiltrative cardiomyopathy of the septum. May need to consider cardiac MRI in the future  Patient does report a hospital follow-up. She reports doing well with just a little bit of ankle edema. She denies any orthopnea, PND, dyspnea as well as nausea, vomiting, fever, chest pain,  dizziness,  cough, congestion, abdominal pain, hematochezia, melena, lower extremity edema, claudication. Her primary doctor had told her to start taking her Lasix every other day back on May 5 is her creatinine had been elevated. We do not have those lab values. Last serum creatinine was 1.37 on May 4. She been weighing herself at home she has a had a couple [place] scales however her weight on Monday was 202 and the next day was 191. Reviewed her weight record and it was very erratic. She'll try and get a new scale  Wt Readings from Last 3 Encounters:  04/13/16 222 lb (100.699 kg)  04/04/16 208 lb (94.348 kg)  03/23/16 212 lb 8.4 oz (96.4 kg)     Past Medical History  Diagnosis Date  . Anemia   . Hypertension   . History of spinal stenosis   . History of rotator cuff tear     Torn right rotator cuff.  . Neuropathy associated with endocrine disorder ([place])   . Diabetes mellitus     type 2  . Chronic kidney disease     Renal  Insuffiency, see [facility] once a year  . GERD (gastroesophageal reflux disease)   . Arthritis   . CHF (congestive heart failure) ([place])     Current Outpatient Prescriptions  Medication Sig Dispense Refill  . aspirin EC 81 MG EC tablet Take 1 tablet (81 mg total) by mouth daily. 30 tablet 0  . Calcium Carbonate-Vit D-Min (CALCIUM 1200 PO) Take 600 mg by mouth daily.     . captopril (CAPOTEN) 12.5 MG tablet Take 1 tablet (12.5 mg total) by mouth 2 (two) times daily. 180 tablet 3  . carvedilol (COREG) 3.125 MG tablet Take 1 tablet (3.125 mg total) by mouth 2 (two) times daily with a meal. 180 tablet 3  . cholecalciferol (VITAMIN D) 1000 UNITS tablet Take 1,000 Units by mouth daily. Reported on 03/27/2016    . docusate sodium (COLACE) 100 MG capsule Take 100 mg by mouth daily as needed for mild constipation.    . ferrous sulfate 325 (65 FE) MG EC tablet Take 325 mg by mouth daily.     . furosemide (LASIX) 20 MG tablet Take 1 tablet (20 mg total) by mouth daily. 90 tablet 3  . glimepiride (AMARYL) 1 MG tablet Take 1 mg by mouth 2 (two) times daily.     . Multiple Vitamin (MULTIVITAMIN) tablet Take 1 tablet by mouth daily.      Marland Kitchen  nortriptyline (PAMELOR) 10 MG capsule Take 10 mg by mouth at bedtime.    Marland Kitchen omeprazole (PRILOSEC) 40 MG capsule Take 40 mg by mouth daily.      . potassium chloride (K-DUR) 10 MEQ tablet Take 1 tablet (10 mEq total) by mouth daily. 90 tablet 3  . pravastatin (PRAVACHOL) 40 MG tablet Take 40 mg by mouth daily.      Marland Kitchen PRESCRIPTION MEDICATION 1 drop every 3 (three) months. Eye drops to be used the day before, of , and after eye injections    . traMADol (ULTRAM) 50 MG tablet Take 50 mg by mouth every 6 (six) hours as needed for moderate pain.   0   No current facility-administered medications for this visit.    Allergies:    Allergies  Allergen Reactions  . Sulfa Antibiotics Swelling    Social History:  The patient  reports that she has quit smoking. She does not  have any smokeless tobacco history on file. She reports that she does not drink alcohol or use illicit drugs.   Family history:   Family History  Problem Relation Age of Onset  . Diabetes type II Mother   . Hypertension Father   . Diabetes type II Sister   . Ovarian cancer Sister   . Diabetes type II Other   . Stroke Neg Hx   . CAD Neg Hx   . Heart failure Neg Hx     ROS:  Please see the history of present illness.  All other systems reviewed and negative.   PHYSICAL EXAM: VS:  BP 94/60 mmHg  Pulse 80  Ht 5\' 6"  (1.676 m)  Wt 222 lb (100.699 kg)  BMI 35.85 kg/m2 Obese, well developed, in no acute distress HEENT: Pupils are equal round react to light accommodation extraocular movements are intact.  Neck: no JVDNo cervical lymphadenopathy. Cardiac: Regular rate and rhythm without murmurs rubs or gallops. Lungs:  clear to auscultation bilaterally, no wheezing, rhonchi or rales Abd: soft, nontender, positive bowel sounds all quadrants, no hepatosplenomegaly Ext: 1+ lower extremity edema.  2+ radial and dorsalis pedis pulses. Skin: warm and dry Neuro:  Grossly normal    ASSESSMENT AND PLAN:  Problem List Items Addressed This Visit    Essential hypertension - Primary   Relevant Medications   captopril (CAPOTEN) 12.5 MG tablet   carvedilol (COREG) 3.125 MG tablet   furosemide (LASIX) 20 MG tablet   CKD (chronic kidney disease)   Chronic combined systolic and diastolic heart failure (HCC)   Relevant Medications   captopril (CAPOTEN) 12.5 MG tablet   carvedilol (COREG) 3.125 MG tablet   furosemide (LASIX) 20 MG tablet     This Otero appears to be doing well post hospital. Based on our scale compared to the hospital weight she's up 14 pounds however, I do not think this is accurate. Her weights at home are also inaccurate. She's going to try and get a new scale. With the exception of some mild lower extremity edema she appears euvolemic. Lungs are clear with no JVD. Continue  Lasix 20 mg every other day. Will increase to 40 mg daily if she goes up 3 pounds in 24 hours or 5 in a week. She'll also increase her potassium to 20 and acute that time. If her weight continues to go up she will call the office. Blood pressure is a little bit low today however she is not symptomatic. No changes to current medications which include captopril 12.5, Coreg 3.125 twice  daily. Also continue statin and aspirin. Follow-up with Dr. Debara Pickett in 3 months

## 2016-04-14 ENCOUNTER — Other Ambulatory Visit: Payer: Self-pay | Admitting: *Deleted

## 2016-04-14 MED ORDER — CAPTOPRIL 12.5 MG PO TABS: 12.5000 mg | ORAL_TABLET | Freq: Two times a day (BID) | ORAL | Status: DC

## 2016-04-14 MED ORDER — CARVEDILOL 3.125 MG PO TABS: 3.1250 mg | ORAL_TABLET | Freq: Two times a day (BID) | ORAL | Status: DC

## 2016-04-14 MED ORDER — POTASSIUM CHLORIDE ER 10 MEQ PO TBCR: 10.0000 meq | EXTENDED_RELEASE_TABLET | Freq: Every day | ORAL | Status: DC

## 2016-04-14 MED ORDER — FUROSEMIDE 20 MG PO TABS: 20.0000 mg | ORAL_TABLET | Freq: Every day | ORAL | Status: DC

## 2016-04-14 NOTE — Patient Outreach (Signed)
Call received from member inquiring about obtaining a reliable scale.  She report that she was seen at her cardiology office today and weighed 222 pounds.  She state that her scale at home this morning read 190 pounds.  She is concerned that this is a new scale and it is very different from physician office.  She is requesting assistance obtaining a new scale that correlates better.  She has [employer], will contact them to see if she qualifies for telemonitoring program, in which a scale will be provided for her.  She verbalizes understanding.  She reports that she has not had any problems with her breathing, and her swelling continues to decrease.  She states she is complaint with her medication, no questions.  Reports being in the green zone today, but again voices concern regarding the difference in weights.  If does not qualify for [employer] telemonitoring program, [facility] will provide accurate scale.  Member denies any further question, advised to contact with concerns.  Will follow up next week.  Valente David, BSN, [place] Management  [facility] Care Manager [phone]

## 2016-04-19 ENCOUNTER — Other Ambulatory Visit: Payer: Self-pay | Admitting: *Deleted

## 2016-04-19 MED ORDER — POTASSIUM CHLORIDE ER 10 MEQ PO TBCR: 10.0000 meq | EXTENDED_RELEASE_TABLET | Freq: Every day | ORAL | Status: DC

## 2016-04-19 MED ORDER — CAPTOPRIL 12.5 MG PO TABS: 12.5000 mg | ORAL_TABLET | Freq: Two times a day (BID) | ORAL | Status: DC

## 2016-04-19 MED ORDER — FUROSEMIDE 20 MG PO TABS: 20.0000 mg | ORAL_TABLET | Freq: Every day | ORAL | Status: DC

## 2016-04-19 MED ORDER — CARVEDILOL 3.125 MG PO TABS: 3.1250 mg | ORAL_TABLET | Freq: Two times a day (BID) | ORAL | Status: DC

## 2016-04-19 NOTE — Telephone Encounter (Signed)
Rx(s) sent to pharmacy electronically.  

## 2016-04-19 NOTE — Telephone Encounter (Signed)
Per patient, [facility] will need these to be authorized by an MD before they can process them.

## 2016-04-20 ENCOUNTER — Other Ambulatory Visit: Payer: Self-pay | Admitting: Internal Medicine

## 2016-04-21 ENCOUNTER — Other Ambulatory Visit: Payer: Self-pay | Admitting: *Deleted

## 2016-04-21 NOTE — Patient Outreach (Signed)
Weekly transition of care call placed to member.  She state she is doing "fine."  She denies any shortness of breath or chest discomfort.  She state her swelling continues to decrease.  Made aware that referral for telemonitoring through [employer] was placed, denies being contacted as of yet.  Although she has not received a new scale, she has continued to monitor daily on the one she has, comparing trends.  Last Friday, she reported a weight of 190 from her scale, today she is consistent at 190.  Encouraged to continue to daily weights while waiting to look forward to contact from [employer].  She also reports compliance with medications.  Denies any concerns at this time, advised to contact with questions.  Will follow up next week.  Valente David, [place], MSN [place]  [phone]

## 2016-04-28 ENCOUNTER — Other Ambulatory Visit: Payer: Self-pay | Admitting: *Deleted

## 2016-04-28 DIAGNOSIS — I10 Essential (primary) hypertension: Secondary | ICD-10-CM

## 2016-04-28 DIAGNOSIS — E1169 Type 2 diabetes mellitus with other specified complication: Secondary | ICD-10-CM

## 2016-04-28 DIAGNOSIS — I5041 Acute combined systolic (congestive) and diastolic (congestive) heart failure: Secondary | ICD-10-CM

## 2016-04-28 NOTE — Patient Outreach (Signed)
Weekly transition of care call placed to member.  She state she is doing "good."  She reports that she has gotten her scale to be more accurate, correlating to her physician's scale.  She reports her weights today being 214 pounds, which is 8 pounds down from her last office visit.  She is happy with her progress but request additional education on managing a low salt diet.  She has follow up appointments with her PCP and cardiologist in August, and has secured an appointment with the podiatrist for next week.  Member made aware that she has completed her transition of care program.  She denies any needs for community care management, but is open to continued involvement with [facility] health coach for education on heart failure management, particularly diet.  She is aware that she may notify the health coach at any time if she need additional resources and/or a home visit from this care manager.  She verbalizes understanding.  Will place referral to health coach.  Valente David, [place], MSN [place] [phone]

## 2016-05-08 ENCOUNTER — Other Ambulatory Visit: Payer: Self-pay | Admitting: *Deleted

## 2016-05-08 NOTE — Patient Outreach (Signed)
Call received from Harmon Pier, nurse with [employer], to discuss telemonitoring and member's involvement with their heart failure program.  She state that the member has been chosen to begin program and equipment has already been shipped to her home.  Harmon Pier state she will contact member to follow up with any questions regarding equipment and/or the program.  This care manager will update health coach as she is now involved with disease management.  Valente David, [place], MSN [place] [phone]

## 2016-05-09 ENCOUNTER — Encounter: Payer: Self-pay | Admitting: Podiatry

## 2016-05-09 ENCOUNTER — Ambulatory Visit (INDEPENDENT_AMBULATORY_CARE_PROVIDER_SITE_OTHER): Payer: Medicare Other | Admitting: Podiatry

## 2016-05-09 VITALS — BP 121/74 | HR 69 | Resp 12

## 2016-05-09 DIAGNOSIS — E1142 Type 2 diabetes mellitus with diabetic polyneuropathy: Secondary | ICD-10-CM | POA: Diagnosis not present

## 2016-05-09 DIAGNOSIS — L84 Corns and callosities: Secondary | ICD-10-CM

## 2016-05-09 NOTE — Patient Instructions (Signed)
Today your diabetic foot screen demonstrated decreased feeling in your feet associated with neuropathy There was a well-organized corn on the end of the third left toe which was trimmed Return as needed for repeat trimming of his corn Return at least yearly for diabetic foot evaluation Diabetes and Foot Care Diabetes may cause you to have problems because of poor blood supply (circulation) to your feet and legs. This may cause the skin on your feet to become thinner, break easier, and heal more slowly. Your skin may become dry, and the skin may peel and crack. You may also have nerve damage in your legs and feet causing decreased feeling in them. You may not notice minor injuries to your feet that could lead to infections or more serious problems. Taking care of your feet is one of the most important things you can do for yourself.  HOME CARE INSTRUCTIONS  Wear shoes at all times, even in the house. Do not go barefoot. Bare feet are easily injured.  Check your feet daily for blisters, cuts, and redness. If you cannot see the bottom of your feet, use a mirror or ask someone for help.  Wash your feet with warm water (do not use hot water) and mild soap. Then pat your feet and the areas between your toes until they are completely dry. Do not soak your feet as this can dry your skin.  Apply a moisturizing lotion or petroleum jelly (that does not contain alcohol and is unscented) to the skin on your feet and to dry, brittle toenails. Do not apply lotion between your toes.  Trim your toenails straight across. Do not dig under them or around the cuticle. File the edges of your nails with an emery board or nail file.  Do not cut corns or calluses or try to remove them with medicine.  Wear clean socks or stockings every day. Make sure they are not too tight. Do not wear knee-high stockings since they may decrease blood flow to your legs.  Wear shoes that fit properly and have enough cushioning. To break  in new shoes, wear them for just a few hours a day. This prevents you from injuring your feet. Always look in your shoes before you put them on to be sure there are no objects inside.  Do not cross your legs. This may decrease the blood flow to your feet.  If you find a minor scrape, cut, or break in the skin on your feet, keep it and the skin around it clean and dry. These areas may be cleansed with mild soap and water. Do not cleanse the area with peroxide, alcohol, or iodine.  When you remove an adhesive bandage, be sure not to damage the skin around it.  If you have a wound, look at it several times a day to make sure it is healing.  Do not use heating pads or hot water bottles. They may burn your skin. If you have lost feeling in your feet or legs, you may not know it is happening until it is too late.  Make sure your health care provider performs a complete foot exam at least annually or more often if you have foot problems. Report any cuts, sores, or bruises to your health care provider immediately. SEEK MEDICAL CARE IF:   You have an injury that is not healing.  You have cuts or breaks in the skin.  You have an ingrown nail.  You notice redness on your legs or  feet.  You feel burning or tingling in your legs or feet.  You have pain or cramps in your legs and feet.  Your legs or feet are numb.  Your feet always feel cold. SEEK IMMEDIATE MEDICAL CARE IF:   There is increasing redness, swelling, or pain in or around a wound.  There is a red line that goes up your leg.  Pus is coming from a wound.  You develop a fever or as directed by your health care provider.  You notice a bad smell coming from an ulcer or wound.   This information is not intended to replace advice given to you by your health care provider. Make sure you discuss any questions you have with your health care provider.   Document Released: 11/03/2000 Document Revised: 07/09/2013 Document Reviewed:  04/15/2013 Elsevier Interactive Patient Education [employer].

## 2016-05-09 NOTE — Progress Notes (Signed)
   Subjective:    Patient ID: Mandy Rhodes, female    DOB: May 05, 1941, 75 y.o.   MRN: [id number]  HPI    This patient presents today complaining of a painful third left toe for 1 month aggravated with standing walking wearing shoes and relieved with rest and elevation. The discomfort has increased gradually over the past 1 month Patient denies any professional treatment or self treatment for this problem She is a diabetic type II denying any history of foot ulceration, claudication or amputation  Review of Systems  Cardiovascular: Positive for leg swelling.  Genitourinary: Positive for frequency.  Musculoskeletal: Positive for gait problem.       Objective:   Physical Exam  Orientated 3  Vascular: No calf edema or calf tenderness bilaterally DP ulcers 2/4 bilaterally PT pulses 1/4 bilaterally Capillary reflex immediate bilaterally  Neurological: Sensation to 10 g monofilament wire intact 3/5 right 1/5 left Vibratory sensation nonreactive bilaterally Ankle reflexes reactive bilaterally  Dermatological: No open skin lesions bilaterally Well-organized core distal third left toe The toenails are hypertrophic, deformed 6-10  Musculoskeletal: Patient walks slowly with assistance of a walker HAV left Hammertoe second left There is no restriction ankle, subtalar, midtarsal joints bilaterally       Assessment & Plan:   Assessment: Diabetic peripheral neuropathy Corn distal third left toe Mycotic toenails 6-10  Plan: Today review the results examination patient today made aware of that there was a well-organized corn of the distal third left toenail recommended debridement. Patient verbally consents The coronary distal third left toe was debrided without any bleeding. Patient advised that the corn would recur and return as needed for debridement  Reappoint at patient's request or yearly intervals

## 2016-05-19 ENCOUNTER — Other Ambulatory Visit: Payer: Self-pay | Admitting: *Deleted

## 2016-05-19 NOTE — Patient Outreach (Signed)
[place] [facility]) Care Management  05/19/2016  ADITHI POPKO [birth year] [id number]  RN Health Coach attempted #1 Follow up outreach call to patient.  Patient was unavailable. HIPPA compliance voicemail message was left with return callback number.   Plan: RN will call patient again within 14 days.    [place] Care Management [phone]

## 2016-06-01 ENCOUNTER — Encounter: Payer: Self-pay | Admitting: *Deleted

## 2016-06-01 ENCOUNTER — Other Ambulatory Visit: Payer: Self-pay | Admitting: *Deleted

## 2016-06-01 NOTE — Patient Outreach (Signed)
[facility]) Care Management  [place]  06/01/2016   Mandy Rhodes 01-Oct-1941 [id number]  Subjective: RN Health Coach telephone call to patient.  Hipaa compliance verified. Per patient she is doing pretty good. Patient is not having any shortness of breath and very little edema in ankles. Per patient she is taking her medications as ordered. Patient weight today was 218.4 patient is weighing every day and documenting. Patient is looking for some recipes on low sodium foods. Per patient she is doing some walking but not a regular exercise program. Patient is in the green zone. Patient has agreed to follow up outreach calls/   Objective:   Encounter Medications:  Outpatient Encounter Prescriptions as of 06/01/2016  Medication Sig Note  . aspirin EC 81 MG EC tablet Take 1 tablet (81 mg total) by mouth daily.   . Calcium Carbonate-Vit D-Min (CALCIUM 1200 PO) Take 600 mg by mouth daily.    . captopril (CAPOTEN) 12.5 MG tablet Take 1 tablet (12.5 mg total) by mouth 2 (two) times daily.   . carvedilol (COREG) 3.125 MG tablet Take 1 tablet (3.125 mg total) by mouth 2 (two) times daily with a meal.   . cholecalciferol (VITAMIN D) 1000 UNITS tablet Take 1,000 Units by mouth daily. Reported on 03/27/2016   . docusate sodium (COLACE) 100 MG capsule Take 100 mg by mouth daily as needed for mild constipation.   . ferrous sulfate 325 (65 FE) MG EC tablet Take 325 mg by mouth daily.    . furosemide (LASIX) 20 MG tablet Take 1 tablet (20 mg total) by mouth daily.   Marland Kitchen glimepiride (AMARYL) 1 MG tablet Take 1 mg by mouth 2 (two) times daily.    . Multiple Vitamin (MULTIVITAMIN) tablet Take 1 tablet by mouth daily.     . nortriptyline (PAMELOR) 10 MG capsule Take 10 mg by mouth at bedtime.   Marland Kitchen omeprazole (PRILOSEC) 40 MG capsule Take 40 mg by mouth daily.     . potassium chloride (K-DUR) 10 MEQ tablet Take 1 tablet (10 mEq total) by mouth daily.   . pravastatin (PRAVACHOL) 40 MG  tablet Take 40 mg by mouth daily.     Marland Kitchen PRESCRIPTION MEDICATION 1 drop every 3 (three) months. Eye drops to be used the day before, of , and after eye injections 06/01/2016: Next injection due 06/07/2016  . traMADol (ULTRAM) 50 MG tablet Take 50 mg by mouth every 6 (six) hours as needed for moderate pain.     No facility-administered encounter medications on file as of 06/01/2016.    Functional Status:  In your present state of health, do you have any difficulty performing the following activities: 06/01/2016 03/27/2016  Hearing? N N  Vision? Y N  Difficulty concentrating or making decisions? N N  Walking or climbing stairs? Y N  Dressing or bathing? N N  Doing errands, shopping? Y N  Preparing Food and eating ? N Y  Using the Toilet? N N  In the past six months, have you accidently leaked urine? N N  Do you have problems with loss of bowel control? N N  Managing your Medications? N N  Managing your Finances? N Y  Housekeeping or managing your Housekeeping? - Y    Fall/Depression Screening: PHQ 2/9 Scores 06/01/2016 03/27/2016  PHQ - 2 Score 0 0   THN CM Care Plan Problem One        Most Recent Value   Care Plan Problem One  Knowledge deficit in self management of congestive heart failure   Role Documenting the Problem One  [place] for Problem One  Active   THN Long Term Goal (31-90 days)  Patient will not be readmitted to hospital within the next 90 days for congestive heart failure   THN Long Term Goal Start Date  06/01/16   Interventions for Problem One [place] reminded the patient to keep appointments with PCP and cardiologist. RN reminded patient the importance of taking medications as prescribed, RN monitored patient monthly by telephonic   THN CM Short Term Goal #1 (0-30 days)  Patient will be able to describe food that are low and high in sodium within the next 30 days.   THN CM Short Term Goal #1 Start Date  06/01/16   Interventions for  Short Term Goal #1  RN sent patient a picture chart of low and high sodium foods. RN send patient educational material on [employer]. RN sent patient some low sodium recipes. RN will follow up within a month.   THN CM Short Term Goal #2 (0-30 days)  Patient will be able to report if peripheral edema is noted in lower extremities within the next 30 days   THN CM Short Term Goal #2 Start Date  06/01/16   Interventions for Short Term Goal #2  RN sent picture chart and educational material on signs of peripheral edema. RN will follow up with discussion and teach back within the next month       Assessment:  Patient will benefit from [place] telephonic outreach for education and support for Congestive Heart Failure self management.  Plan:  RN sent educational material on peripheral edema RN sent recipes for low sodium diet RN sent EMMI on Heart Failure and how to be [employer] RN send picture chart of low sodium food RN will follow up within the month of August for discussion and teach back  [place] Management [phone]

## 2016-06-30 ENCOUNTER — Encounter: Payer: Self-pay | Admitting: *Deleted

## 2016-06-30 ENCOUNTER — Other Ambulatory Visit: Payer: Self-pay | Admitting: *Deleted

## 2016-06-30 NOTE — Patient Outreach (Signed)
[place] [facility]) Care Management  06/30/2016   Mandy Rhodes 1941-04-20 [id number]  Subjective: RN Health Coach telephone call to patient.  Hipaa compliance verified. Per patient her weight today was 213.6. Patient stated that last week she had some episodes of swelling and gaining weight. Patient used action plan and took extra diuretic. Patient states that today she has very little swelling. Per patient she is having some back pain. Patient stated that her xray's showed  that she had arthritis in her back. RN discussed with the patient about doing some exercise to help strengthening her back and helping to make her more mobile. Patient agreed to call the [facility] and discuss the silver sneakers program. Patient stated that she does not eat snacks much. Patient is a diabetic and does not eat a HS snack. Her fasting blood sugar was 73 this am. RN discussed with patient about how the metabolism slows down when you don't eat a regular meals. Patient has agreed to follow up outreach calls.       Objective:   Current Medications:  Current Outpatient Prescriptions  Medication Sig Dispense Refill  . aspirin EC 81 MG EC tablet Take 1 tablet (81 mg total) by mouth daily. 30 tablet 0  . Calcium Carbonate-Vit D-Min (CALCIUM 1200 PO) Take 600 mg by mouth daily.     . captopril (CAPOTEN) 12.5 MG tablet Take 1 tablet (12.5 mg total) by mouth 2 (two) times daily. 180 tablet 1  . carvedilol (COREG) 3.125 MG tablet Take 1 tablet (3.125 mg total) by mouth 2 (two) times daily with a meal. 180 tablet 1  . cholecalciferol (VITAMIN D) 1000 UNITS tablet Take 1,000 Units by mouth daily. Reported on 03/27/2016    . docusate sodium (COLACE) 100 MG capsule Take 100 mg by mouth daily as needed for mild constipation.    . ferrous sulfate 325 (65 FE) MG EC tablet Take 325 mg by mouth daily.     . furosemide (LASIX) 20 MG tablet Take 1 tablet (20 mg total) by mouth daily. 90 tablet 1  . glimepiride (AMARYL) 1 MG  tablet Take 1 mg by mouth 2 (two) times daily.     . Multiple Vitamin (MULTIVITAMIN) tablet Take 1 tablet by mouth daily.      . nortriptyline (PAMELOR) 10 MG capsule Take 10 mg by mouth at bedtime.    Marland Kitchen omeprazole (PRILOSEC) 40 MG capsule Take 40 mg by mouth daily.      . potassium chloride (K-DUR) 10 MEQ tablet Take 1 tablet (10 mEq total) by mouth daily. 90 tablet 1  . pravastatin (PRAVACHOL) 40 MG tablet Take 40 mg by mouth daily.      Marland Kitchen PRESCRIPTION MEDICATION 1 drop every 3 (three) months. Eye drops to be used the day before, of , and after eye injections    . traMADol (ULTRAM) 50 MG tablet Take 50 mg by mouth every 6 (six) hours as needed for moderate pain.   0   No current facility-administered medications for this visit.     Functional Status:  In your present state of health, do you have any difficulty performing the following activities: 06/30/2016 06/01/2016  Hearing? N N  Vision? - Y  Difficulty concentrating or making decisions? N N  Walking or climbing stairs? Y Y  Dressing or bathing? N N  Doing errands, shopping? [name]  Preparing Food and eating ? N N  Using the Toilet? N N  In the past six months,  have you accidently leaked urine? N N  Do you have problems with loss of bowel control? N N  Managing your Medications? N N  Managing your Finances? N N  Housekeeping or managing your Housekeeping? N -  Some recent data might be hidden    Fall/Depression Screening: PHQ 2/9 Scores 06/30/2016 06/01/2016 03/27/2016  PHQ - 2 Score 0 0 0   THN CM Care Plan Problem One   Flowsheet Row Most Recent Value  Care Plan Problem One  Knowledge deficit in self management of congestive heart failure  Role Documenting the Problem One  [place] for Problem One  Active  THN Long Term Goal (31-90 days)  Patient will not be readmitted to hospital within the next 90 days for congestive heart failure  Interventions for Problem One [place] reminded the  patient to keep appointments with PCP and cardiologist. RN reminded patient the importance of taking medications as prescribed, RN monitored patient monthly by telephonic  THN CM Short Term Goal #1 (0-30 days)  Patient will be able to describe food that are low and high in sodium within the next 30 days.  THN CM Short Term Goal #1 Met Date  06/30/16  Interventions for Short Term Goal #1  RN sent patient a picture chart of low and high sodium foods. RN send patient educational material on [employer]. RN sent patient some low sodium recipes. RN will follow up within a month.  THN CM Short Term Goal #2 (0-30 days)  Patient will be able to report if peripheral edema is noted in lower extremities within the next 30 days  THN CM Short Term Goal #2 Met Date  06/30/16  Interventions for Short Term Goal #2  RN sent picture chart and educational material on signs of peripheral edema. RN will follow up with discussion and teach back within the next month  THN CM Short Term Goal #3 (0-30 days)  Patient will be able to verbalize that she has checked into an excercise program within the next 30 days  THN CM Short Term Goal #3 Start Date  06/30/16  Interventions for Short Tern Goal #3  RN discussed with patient about exercising. RN sent patient chair exercises. Patient is calling [employer] to check on programs available and cost  THN CM Short Term Goal #4 (0-30 days)  Patient will be able to verbalize healthy snacks within the next 30 days  THN CM Short Term Goal #4 Start Date  06/30/16  Interventions for Short Term Goal #4  RN sent patient a list of healthy snacks. RN will follow up with discussion and teach back      Assessment:  Patient does not eat meals regularly Patient is adhering to a low sodium diabetic diet Patient is taking medications as per physician order Patient does not exercise on a routine basis  Plan: RN sent  patient a list of healthy snacks RN sent patient a list of chair exercises RN sent  patient some educational information on diabetes RN will follow up outreach within the month of September  [place] Management [phone]

## 2016-07-11 ENCOUNTER — Emergency Department (HOSPITAL_COMMUNITY): Payer: Medicare Other

## 2016-07-11 ENCOUNTER — Emergency Department (HOSPITAL_COMMUNITY)
Admission: EM | Admit: 2016-07-11 | Discharge: 2016-07-11 | Disposition: A | Discharge status: Home / Self Care | Payer: Medicare Other | Attending: Emergency Medicine | Admitting: Emergency Medicine

## 2016-07-11 ENCOUNTER — Encounter (HOSPITAL_COMMUNITY): Payer: Self-pay

## 2016-07-11 DIAGNOSIS — I509 Heart failure, unspecified: Secondary | ICD-10-CM | POA: Diagnosis not present

## 2016-07-11 DIAGNOSIS — N183 Chronic kidney disease, stage 3 (moderate): Secondary | ICD-10-CM | POA: Insufficient documentation

## 2016-07-11 DIAGNOSIS — M545 Low back pain, unspecified: Secondary | ICD-10-CM

## 2016-07-11 DIAGNOSIS — R0602 Shortness of breath: Secondary | ICD-10-CM | POA: Diagnosis present

## 2016-07-11 DIAGNOSIS — E1122 Type 2 diabetes mellitus with diabetic chronic kidney disease: Secondary | ICD-10-CM | POA: Insufficient documentation

## 2016-07-11 DIAGNOSIS — Z87891 Personal history of nicotine dependence: Secondary | ICD-10-CM | POA: Insufficient documentation

## 2016-07-11 DIAGNOSIS — Z7984 Long term (current) use of oral hypoglycemic drugs: Secondary | ICD-10-CM | POA: Insufficient documentation

## 2016-07-11 DIAGNOSIS — Z7982 Long term (current) use of aspirin: Secondary | ICD-10-CM | POA: Insufficient documentation

## 2016-07-11 DIAGNOSIS — I130 Hypertensive heart and chronic kidney disease with heart failure and stage 1 through stage 4 chronic kidney disease, or unspecified chronic kidney disease: Secondary | ICD-10-CM | POA: Diagnosis not present

## 2016-07-11 DIAGNOSIS — Z79899 Other long term (current) drug therapy: Secondary | ICD-10-CM | POA: Diagnosis not present

## 2016-07-11 LAB — CBG MONITORING, ED: Glucose-Capillary: 92 mg/dL (ref 65–99)

## 2016-07-11 MED ORDER — OXYCODONE-ACETAMINOPHEN 5-325 MG PO TABS
2.0000 | ORAL_TABLET | Freq: Once | ORAL | Status: AC
  Administered 2016-07-11: 2 via ORAL
  Filled 2016-07-11: qty 2

## 2016-07-11 MED ORDER — FUROSEMIDE 10 MG/ML IJ SOLN
40.0000 mg | Freq: Once | INTRAMUSCULAR | Status: AC
  Administered 2016-07-11: 40 mg via INTRAVENOUS
  Filled 2016-07-11: qty 4

## 2016-07-11 MED ORDER — POLYMYXIN B-TRIMETHOPRIM 10000-0.1 UNIT/ML-% OP SOLN
2.0000 [drp] | Freq: Once | OPHTHALMIC | Status: AC
  Administered 2016-07-11: 2 [drp] via OPHTHALMIC
  Filled 2016-07-11: qty 10

## 2016-07-11 MED ORDER — OXYCODONE HCL 5 MG PO TABS: 5.0000 mg | ORAL_TABLET | Freq: Two times a day (BID) | ORAL | 0 refills | Status: DC | PRN

## 2016-07-11 MED ORDER — FUROSEMIDE 40 MG PO TABS: 40.0000 mg | ORAL_TABLET | Freq: Every day | ORAL | 0 refills | Status: DC

## 2016-07-11 NOTE — ED Notes (Signed)
Pt in xray

## 2016-07-11 NOTE — ED Triage Notes (Signed)
Pt complains of lower back pain for two weeks, ans tonight she was getting up to got to the bathroom and became short of breath

## 2016-07-11 NOTE — ED Provider Notes (Addendum)
[facility] DEPT Provider Note   CSN: [id number] Arrival date & time: 07/11/16  [id number]     History   Chief Complaint Chief Complaint  Patient presents with  . Back Pain    HPI Mandy Rhodes is a 75 y.o. female with a past medical history of CHF on Lasix 20 mg every other day, presenting today with back pain shortness of breath. Patient states she has had worsening back pain since she bent over to pick something up a couple of days ago. She has been taking pain medication given to her by her primary care physician without any significant relief. Patient also complains of worsening lower stability edema and dyspnea on exertion. She believes this is her heart failure. She has increased her Lasix to 20 mg daily without any resolution of her symptoms. She has some mild chest pressure as well as shortness of breath becomes severe. She denies any trauma to her back. She has no incontinence. She's had no fevers or weight loss. There are no further complaints.    10 Systems reviewed and are negative for acute change except as noted in the HPI.   HPI  Past Medical History:  Diagnosis Date  . Anemia   . Arthritis   . CHF (congestive heart failure) ([place])   . Chronic kidney disease    Renal Insuffiency, see [facility] once a year  . Diabetes mellitus    type 2  . GERD (gastroesophageal reflux disease)   . History of rotator cuff tear    Torn right rotator cuff.  . History of spinal stenosis   . Hypertension   . Neuropathy associated with endocrine disorder [facility])     Patient Active Problem List   Diagnosis Date Noted  . Chronic combined systolic and diastolic heart failure ([place]) 04/13/2016  . Type 2 diabetes mellitus with stage 3 chronic kidney disease, without long-term current use of insulin ([place])   . CHF (congestive heart failure) ([place]) 03/20/2016  . CKD (chronic kidney disease)   . Acute systolic congestive heart failure, NYHA class 3 ([place]) 03/19/2016  . Cough   .  Cardiomegaly 03/17/2016  . CKD (chronic kidney disease) stage 2, GFR 60-89 ml/min 03/17/2016  . Elevated troponin 03/17/2016  . Essential hypertension 03/17/2016  . DM type 2 causing CKD stage 2 ([place]) 03/17/2016  . Elevated troponin I level 03/17/2016  . Deficiency anemia 09/29/2011  . Diabetes mellitus ([place]) 09/29/2011  . Renal insufficiency 09/29/2011    Past Surgical History:  Procedure Laterality Date  . ABDOMINAL HYSTERECTOMY  1971   Partial  . APPENDECTOMY    . BACK SURGERY  2009   Disectomy  . CARDIAC CATHETERIZATION N/A 03/22/2016   Procedure: Left Heart Cath and Coronary Angiography;  Surgeon: Peter M Martinique, MD;  Location: [facility];  Service: Cardiovascular;  Laterality: N/A;  . CHOLECYSTECTOMY  1969   Status post   . CYST REMOVAL TRUNK Right 12/03/2014   Procedure: EXCISION OF RIGHT BACK CYST ;  Surgeon: Coralie Keens, MD;  Location: [facility];  Service: General;  Laterality: Right;  . FOOT SURGERY Bilateral    bone removed  . NEUROPLASTY / TRANSPOSITION MEDIAN NERVE AT CARPAL TUNNEL Bilateral    Hx  Left carpal tunnel repair    OB History    No data available       Home Medications    Prior to Admission medications   Medication Sig Start Date End Date Taking? Authorizing Provider  aspirin EC  81 MG EC tablet Take 1 tablet (81 mg total) by mouth daily. 03/23/16   Shanker Kristeen Mans, MD  Calcium Carbonate-Vit D-Min (CALCIUM 1200 PO) Take 600 mg by mouth daily.     Historical Provider, MD  captopril (CAPOTEN) 12.5 MG tablet Take 1 tablet (12.5 mg total) by mouth 2 (two) times daily. 04/19/16   Pixie Casino, MD  carvedilol (COREG) 3.125 MG tablet Take 1 tablet (3.125 mg total) by mouth 2 (two) times daily with a meal. 04/19/16   Pixie Casino, MD  cholecalciferol (VITAMIN D) 1000 UNITS tablet Take 1,000 Units by mouth daily. Reported on 03/27/2016    Historical Provider, MD  docusate sodium (COLACE) 100 MG capsule Take 100 mg by mouth daily as needed for mild  constipation.    Historical Provider, MD  ferrous sulfate 325 (65 FE) MG EC tablet Take 325 mg by mouth daily.     Historical Provider, MD  furosemide (LASIX) 40 MG tablet Take 1 tablet (40 mg total) by mouth daily. 07/11/16   Everlene Balls, MD  glimepiride (AMARYL) 1 MG tablet Take 1 mg by mouth 2 (two) times daily.  02/15/16   Historical Provider, MD  Multiple Vitamin (MULTIVITAMIN) tablet Take 1 tablet by mouth daily.      Historical Provider, MD  nortriptyline (PAMELOR) 10 MG capsule Take 10 mg by mouth at bedtime.    Historical Provider, MD  omeprazole (PRILOSEC) 40 MG capsule Take 40 mg by mouth daily.      Historical Provider, MD  potassium chloride (K-DUR) 10 MEQ tablet Take 1 tablet (10 mEq total) by mouth daily. 04/19/16   Pixie Casino, MD  pravastatin (PRAVACHOL) 40 MG tablet Take 40 mg by mouth daily.      Historical Provider, MD  PRESCRIPTION MEDICATION 1 drop every 3 (three) months. Eye drops to be used the day before, of , and after eye injections    Historical Provider, MD  traMADol (ULTRAM) 50 MG tablet Take 50 mg by mouth every 6 (six) hours as needed for moderate pain.  04/26/15   Historical Provider, MD    Family History Family History  Problem Relation Age of Onset  . Diabetes type II Mother   . Hypertension Father   . Diabetes type II Sister   . Ovarian cancer Sister   . Diabetes type II Other   . Stroke Neg Hx   . CAD Neg Hx   . Heart failure Neg Hx     Social History Social History  Substance Use Topics  . Smoking status: Former Research scientist (life sciences)  . Smokeless tobacco: Never Used     Comment: 12/02/13- quit over 20 years ago  . Alcohol use No     Allergies   Sulfa antibiotics   Review of Systems Review of Systems   Physical Exam Updated Vital Signs BP 123/56 (BP Location: Left Arm)   Pulse 73   Temp 97.7 F (36.5 C) (Oral)   SpO2 94%   Physical Exam  Constitutional: She is oriented to person, place, and time. She appears well-developed and well-nourished.  No distress.  HENT:  Head: Normocephalic and atraumatic.  Nose: Nose normal.  Mouth/Throat: Oropharynx is clear and moist. No oropharyngeal exudate.  Eyes: EOM are normal. Pupils are equal, round, and reactive to light. Left eye exhibits discharge. No scleral icterus.  There is erythema in the left sclera. There is purulent discharge seen on the nasal aspect.  Neck: Normal range of motion. Neck supple. No  JVD present. No tracheal deviation present. No thyromegaly present.  Cardiovascular: Normal rate, regular rhythm and normal heart sounds.  Exam reveals no gallop and no friction rub.   No murmur heard. Pulmonary/Chest: Effort normal and breath sounds normal. No respiratory distress. She has no wheezes. She exhibits no tenderness.  Abdominal: Soft. Bowel sounds are normal. She exhibits no distension and no mass. There is no tenderness. There is no rebound and no guarding.  Musculoskeletal: Normal range of motion. She exhibits edema. She exhibits no tenderness.  No tenderness to palpation of the lumbar spine. No deformity seen.  Lymphadenopathy:    She has no cervical adenopathy.  Neurological: She is alert and oriented to person, place, and time. No cranial nerve deficit. She exhibits normal muscle tone.  Normal strength and sensation in all extremities. Normal cerebellar testing.  Skin: Skin is warm and dry. No rash noted. No erythema. No pallor.  Nursing note and vitals reviewed.    ED Treatments / Results  Labs (all labs ordered are listed, but only abnormal results are displayed) Labs Reviewed  CBG MONITORING, ED    EKG  EKG Interpretation  Date/Time:  Tuesday July 11 2016 06:27:41 EDT Ventricular Rate:  75 PR Interval:    QRS Duration: 109 QT Interval:  425 QTC Calculation: 475 R Axis:   72 Text Interpretation:  Sinus rhythm No significant change since last tracing Confirmed by Glynn Octave [phone]) on 07/11/2016 6:36:55 AM       Radiology Dg Chest 2  View  Result Date: 07/11/2016 CLINICAL DATA:  Acute onset of mid chest pain and shortness of breath tonight. EXAM: CHEST  2 VIEW COMPARISON:  Radiographs 03/17/2016 FINDINGS: Cardiomegaly which is similar to prior exam. Increasing vascular congestion in possible mild perihilar edema. Blunting of the costophrenic angles without frank pleural effusions. No focal airspace opacity or pneumothorax. There is degenerative change throughout the spine and in both shoulders. IMPRESSION: Findings suggesting mild CHF. Electronically Signed   By: Jeb Levering M.D.   On: 07/11/2016 06:37   Dg Lumbar Spine Complete  Result Date: 07/11/2016 CLINICAL DATA:  Worsening lumbosacral back pain over the past 2 weeks. No known injury. EXAM: LUMBAR SPINE - COMPLETE 4+ VIEW COMPARISON:  Lumbar spine radiographs 04/26/2015 FINDINGS: Transitional lumbosacral anatomy with sacralization of L5. Alignment is unchanged from previous. Vertebral body heights are maintained, no compression deformity. L5-S1 space narrowing secondary to variant anatomy. Minimal disc space narrowing at L3-L4. Multilevel anterior osteophytes. Marked facet arthropathy throughout the entire lumbar spine. No acute fracture. There is atherosclerosis of the abdominal aorta. IMPRESSION: Multilevel degenerative change throughout the lumbar spine, with prominent facet arthropathy. Minimal disc space narrowing at L3-L4 has progressed from prior. No evidence of acute bony abnormality. Electronically Signed   By: Jeb Levering M.D.   On: 07/11/2016 06:41    Procedures Procedures (including critical care time)  Medications Ordered in ED Medications  furosemide (LASIX) injection 40 mg (not administered)  trimethoprim-polymyxin b (POLYTRIM) ophthalmic solution 2 drop (not administered)  oxyCODONE-acetaminophen (PERCOCET/ROXICET) 5-325 MG per tablet 2 tablet (not administered)     Initial Impression / Assessment and Plan / ED Course  I have reviewed the  triage vital signs and the nursing notes.  Pertinent labs & imaging results that were available during my care of the patient were reviewed by me and considered in my medical decision making (see chart for details).  Clinical Course    Patient presents to the emergency department for back pain  and shortness of breath. Her back pain appears muscle skeletal. Due to her age. X-ray for evaluation. Patient is likely having a mild CHF exacerbation. EKG unremarkable and does not show any acute ischemia.   7:24 AM CXR confirms CHF, consistent with exam findings.  She was given 40 mg IV Lasix. We'll discharge home with 40 mg daily for 5 days and follow-up with her cardiologist. Also oxycodone for pain control..  Patient also given Polytrim for possible bacterial conjunctivitis. She has no hypoxia, no tachypnea, and she appears well and comfortable on room air.  Final Clinical Impressions(s) / ED Diagnoses   Final diagnoses:  Acute on chronic congestive heart failure, unspecified congestive heart failure type (HCC)  Midline low back pain without sciatica    New Prescriptions New Prescriptions   FUROSEMIDE (LASIX) 40 MG TABLET    Take 1 tablet (40 mg total) by mouth daily.       Everlene Balls, MD 07/11/16 [phone]

## 2016-07-11 NOTE — ED Notes (Signed)
Back pain for past two weeks

## 2016-07-12 ENCOUNTER — Telehealth: Payer: Self-pay | Admitting: Internal Medicine

## 2016-07-12 NOTE — Telephone Encounter (Signed)
Received records from [facility] for appointment with Dr. Debara Pickett on 07/14/16. Records given to [facility] for Dr. Lysbeth Penner schedule for 07/14/16. ab

## 2016-07-14 ENCOUNTER — Encounter: Payer: Self-pay | Admitting: Internal Medicine

## 2016-07-14 ENCOUNTER — Ambulatory Visit (INDEPENDENT_AMBULATORY_CARE_PROVIDER_SITE_OTHER): Payer: Medicare Other | Admitting: Internal Medicine

## 2016-07-14 VITALS — BP 125/72 | HR 82 | Ht 66.0 in | Wt 217.8 lb

## 2016-07-14 DIAGNOSIS — R0602 Shortness of breath: Secondary | ICD-10-CM | POA: Diagnosis not present

## 2016-07-14 DIAGNOSIS — N189 Chronic kidney disease, unspecified: Secondary | ICD-10-CM

## 2016-07-14 DIAGNOSIS — Z79899 Other long term (current) drug therapy: Secondary | ICD-10-CM

## 2016-07-14 DIAGNOSIS — I428 Other cardiomyopathies: Secondary | ICD-10-CM | POA: Insufficient documentation

## 2016-07-14 DIAGNOSIS — I429 Cardiomyopathy, unspecified: Secondary | ICD-10-CM

## 2016-07-14 DIAGNOSIS — I10 Essential (primary) hypertension: Secondary | ICD-10-CM

## 2016-07-14 DIAGNOSIS — E785 Hyperlipidemia, unspecified: Secondary | ICD-10-CM

## 2016-07-14 MED ORDER — SACUBITRIL-VALSARTAN 24-26 MG PO TABS: 1.0000 | ORAL_TABLET | Freq: Two times a day (BID) | ORAL | 1 refills | Status: DC

## 2016-07-14 NOTE — Progress Notes (Signed)
OFFICE NOTE  Chief Complaint:  Hospital follow-up  Primary Care Physician: Mandy Schwalbe, MD  HPI:  Mandy Rhodes is a 75 y.o. female was recently hospitalized for acute combined systolic and diastolic heart failure. Echocardiogram revealed a newly reduced EF of 35-40% with severe LVH was started on IV Lasix and diuresed. Her left heart catheterization revealing a 20% proximal LAD lesion Percent ostial ramus. Echocardiogram was suggestive of infiltrative cardiomyopathy of the septum. May need to consider cardiac MRI in the future. She saw Mandy Craw, PA-C in follow-up in May 2017. She seemed to be doing well at that time however her weight was going up. He increased her Lasix for a few days and then decreased it down to 20 mg QOD. She has been enrolled in home monitoring with [place] [facility] and recently had an increase in her weight over the past week. She was seen in the emergency department on August 22 for back pain and at that time was more short of breath and had some swelling. They increased her Lasix to 40 mg daily and she is currently on that dose. Weight is now down about 5 pounds and her breathing has improved somewhat.  PMHx:  Past Medical History:  Diagnosis Date  . Anemia   . Arthritis   . CHF (congestive heart failure) ([place])   . Chronic kidney disease    Renal Insuffiency, see [facility] once a year  . Diabetes mellitus    type 2  . GERD (gastroesophageal reflux disease)   . History of rotator cuff tear    Torn right rotator cuff.  . History of spinal stenosis   . Hypertension   . Neuropathy associated with endocrine disorder [facility])     Past Surgical History:  Procedure Laterality Date  . ABDOMINAL HYSTERECTOMY  1971   Partial  . APPENDECTOMY    . BACK SURGERY  2009   Disectomy  . CARDIAC CATHETERIZATION N/A 03/22/2016   Procedure: Left Heart Cath and Coronary Angiography;  Surgeon: Mandy M Martinique, MD;  Location: [place] CV LAB;  Service:  Cardiovascular;  Laterality: N/A;  . CHOLECYSTECTOMY  1969   Status post   . CYST REMOVAL TRUNK Right 12/03/2014   Procedure: EXCISION OF RIGHT BACK CYST ;  Surgeon: Mandy Keens, MD;  Location: [place];  Service: General;  Laterality: Right;  . FOOT SURGERY Bilateral    bone removed  . NEUROPLASTY / TRANSPOSITION MEDIAN NERVE AT CARPAL TUNNEL Bilateral    Hx  Left carpal tunnel repair    FAMHx:  Family History  Problem Relation Age of Onset  . Diabetes type II Mother   . Hypertension Father   . Diabetes type II Sister   . Ovarian cancer Sister   . Diabetes type II Other   . Stroke Neg Hx   . CAD Neg Hx   . Heart failure Neg Hx     SOCHx:   reports that she has quit smoking. She has never used smokeless tobacco. She reports that she does not drink alcohol or use drugs.  ALLERGIES:  Allergies  Allergen Reactions  . Sulfa Antibiotics Swelling    ROS: Pertinent items noted in HPI and remainder of comprehensive ROS otherwise negative.  HOME MEDS: Current Outpatient Prescriptions  Medication Sig Dispense Refill  . aspirin EC 81 MG EC tablet Take 1 tablet (81 mg total) by mouth daily. 30 tablet 0  . Calcium Carbonate-Vit D-Min (CALCIUM 1200 PO) Take 600 mg by mouth  daily.     . carvedilol (COREG) 3.125 MG tablet Take 1 tablet (3.125 mg total) by mouth 2 (two) times daily with a meal. 180 tablet 1  . cholecalciferol (VITAMIN D) 1000 UNITS tablet Take 1,000 Units by mouth daily. Reported on 03/27/2016    . docusate sodium (COLACE) 100 MG capsule Take 100 mg by mouth daily as needed for mild constipation.    . ferrous sulfate 325 (65 FE) MG EC tablet Take 325 mg by mouth daily.     . furosemide (LASIX) 40 MG tablet Take 1 tablet (40 mg total) by mouth daily. 5 tablet 0  . glimepiride (AMARYL) 1 MG tablet Take 1 mg by mouth 2 (two) times daily.     . Multiple Vitamin (MULTIVITAMIN) tablet Take 1 tablet by mouth daily.      . nortriptyline (PAMELOR) 10 MG capsule Take 10 mg by  mouth at bedtime.    Marland Kitchen omeprazole (PRILOSEC) 40 MG capsule Take 40 mg by mouth daily.      Marland Kitchen oxyCODONE (ROXICODONE) 5 MG immediate release tablet Take 1 tablet (5 mg total) by mouth 2 (two) times daily as needed for severe pain. 10 tablet 0  . potassium chloride (K-DUR) 10 MEQ tablet Take 1 tablet (10 mEq total) by mouth daily. 90 tablet 1  . pravastatin (PRAVACHOL) 40 MG tablet Take 40 mg by mouth daily.      Marland Kitchen PRESCRIPTION MEDICATION 1 drop every 3 (three) months. Eye drops to be used the day before, of , and after eye injections    . traMADol (ULTRAM) 50 MG tablet Take 50 mg by mouth every 6 (six) hours as needed for moderate pain.   0  . sacubitril-valsartan (ENTRESTO) 24-26 MG Take 1 tablet by mouth 2 (two) times daily. 180 tablet 1   No current facility-administered medications for this visit.     LABS/IMAGING: No results found for this or any previous visit (from the past 48 hour(s)). No results found.  WEIGHTS: Wt Readings from Last 3 Encounters:  07/14/16 217 lb 12.8 oz (98.8 kg)  04/13/16 222 lb (100.7 kg)  04/04/16 208 lb (94.3 kg)    VITALS: BP 125/72   Pulse 82   Ht 5\' 6"  (1.676 m)   Wt 217 lb 12.8 oz (98.8 kg)   BMI 35.15 kg/m   EXAM: General appearance: alert, no distress and moderately obese Neck: no carotid bruit and no JVD Lungs: clear to auscultation bilaterally Heart: regular rate and rhythm Abdomen: soft, non-tender; bowel sounds normal; no masses,  no organomegaly and obese Extremities: extremities normal, atraumatic, no cyanosis or edema Pulses: 2+ and symmetric Skin: Skin color, texture, turgor normal. No rashes or lesions Neurologic: Grossly normal Psych: Pleasant  EKG: Normal sinus rhythm at 77  ASSESSMENT: 1. Chronic systolic congestive heart failure, LVEF 35-40% 2. Nonischemic cardiomyopathy-possible infiltrative cardiomyopathy based on echo features 3. Hypertension 4. Dyslipidemia 5. Type 2 diabetes 6. CKD2  PLAN: 1.   Mandy Rhodes  had recent exacerbation of heart failure with weight gain and was noted to have back pain. This is treated with narcotics in the ER and her Lasix was increased. Home weights indicate weight gain over the past month with every other day Lasix however currently she is on 40 mg daily. She will need a repeat metabolic profile and BNP next week. We also discussed optimal treatment medications for her heart failure. I would like to see her on Entresto rather than captopril. Plan to hold captopril at  least 36 hours per guideline recommendations before beginning low-dose Entresto. She will follow-up with her pharmacist in 2 weeks and may have further up titration of her Entresto at that time. If weight is stable or going down she may need to decrease her Lasix to 20 mg daily. Finally, I would like to refer her for a cardiac MRI is risk concern about possible infiltrative cardiomyopathy - for example, amyloidosis or sarcoidosis should be ruled out. This of course, is dependent on her renal function. Creatinine has been around 1.5 and would need to be lower than that for her to get contrast. This would also give [place] an opportunity to reassess LV function.  Follow-up with me in one month.  Pixie Casino, MD, [facility] - Inpatient Attending Cardiologist [place] 07/14/2016, 10:03 AM

## 2016-07-14 NOTE — Patient Instructions (Addendum)
Your physician has recommended you make the following change in your medication...  1. STOP captopril  2. START entresto 24-26mg  twice daily 36 hours after last dose of captopril   Your physician recommends that you schedule a follow-up appointment in [place] with clinical pharmacy staff for a blood pressure check/entresto med titration   Your physician recommends that you return for lab work in [place] (BNP, BMET)  Dr. Debara Pickett has ordered a CARDIAC MRI - this is done at [facility] -- needs to be schedule more than 1 week out (after labs)  Your physician recommends that you schedule a follow-up appointment in: [place] with Dr. Eustaquio Boyden 24-26mg  samples - 1 box (14 day supply) provided to patient

## 2016-07-19 ENCOUNTER — Ambulatory Visit (HOSPITAL_COMMUNITY): Payer: Medicare Other

## 2016-07-20 ENCOUNTER — Ambulatory Visit (HOSPITAL_COMMUNITY)
Admission: RE | Admit: 2016-07-20 | Discharge: 2016-07-20 | Disposition: A | Discharge status: Home / Self Care | Payer: Medicare Other | Source: Ambulatory Visit | Attending: Internal Medicine | Admitting: Internal Medicine

## 2016-07-20 ENCOUNTER — Encounter (HOSPITAL_COMMUNITY): Payer: Self-pay

## 2016-07-20 DIAGNOSIS — I428 Other cardiomyopathies: Secondary | ICD-10-CM | POA: Diagnosis not present

## 2016-07-20 LAB — CREATININE, SERUM
CREATININE: 2.69 mg/dL — AB (ref 0.44–1.00)
GFR calc Af Amer: 19 mL/min — ABNORMAL LOW (ref >60)
GFR, EST NON AFRICAN AMERICAN: 16 mL/min — AB (ref >60)

## 2016-07-21 ENCOUNTER — Telehealth: Payer: Self-pay | Admitting: Internal Medicine

## 2016-07-21 NOTE — Telephone Encounter (Signed)
Returned patient call-made aware of MD Hilty recommendations:  ----- Message from Pixie Casino, MD sent at 07/21/2016  8:16 AM EDT ----- Regarding: Elevated creatinine - MRI cancelled Please instruct patient to stop Entresto and captopril (if for some reason she did not stop it as instructed). Also instruct to hold lasix for renal failure. Recheck BMP and BNP (ordered) in 1 week.  Thanks.  Dr. Debara Pickett   Pt verbalized understanding and aware to get lab work completed next Friday 07/28/16.  Advised to call with further questions/concerns.

## 2016-07-21 NOTE — Telephone Encounter (Signed)
LMTCB

## 2016-07-21 NOTE — Telephone Encounter (Signed)
-----   Message from Pixie Casino, MD sent at 07/21/2016  8:16 AM EDT ----- Regarding: Elevated creatinine - MRI cancelled Please instruct patient to stop Entresto and captopril (if for some reason she did not stop it as instructed). Also instruct to hold lasix for renal failure. Recheck BMP and BNP (ordered) in 1 week.  Thanks.  Dr. Debara Pickett

## 2016-07-21 NOTE — Telephone Encounter (Signed)
New Message ° °Pt returning RN call. Please call back to discuss  °

## 2016-07-25 ENCOUNTER — Telehealth: Payer: Self-pay | Admitting: Internal Medicine

## 2016-07-25 NOTE — Telephone Encounter (Signed)
Pt is coming for blood pressure check on Thursday. She wants to know if she can get her lab work when she comes Thursday please?

## 2016-07-25 NOTE — Telephone Encounter (Signed)
Patient advised to go to lab as recommended, f/u Thursday. Pt voiced understanding. States she has further questions related to [facility] coverage which she will ask at appt. No further concerns.

## 2016-07-27 ENCOUNTER — Ambulatory Visit (INDEPENDENT_AMBULATORY_CARE_PROVIDER_SITE_OTHER): Payer: Medicare Other | Admitting: Pharmacist

## 2016-07-27 ENCOUNTER — Encounter: Payer: Self-pay | Admitting: Pharmacist

## 2016-07-27 VITALS — BP 132/76 | HR 75

## 2016-07-27 DIAGNOSIS — I10 Essential (primary) hypertension: Secondary | ICD-10-CM | POA: Diagnosis not present

## 2016-07-27 NOTE — Patient Instructions (Signed)
Go to the lab today for blood work. We will call you with results and medication changes.

## 2016-07-27 NOTE — Progress Notes (Signed)
Patient ID: Mandy Rhodes                 DOB: Jul 06, 1941                      MRN: [id number]     HPI: Mandy Rhodes is a 75 y.o. female patient of Dr. Debara Pickett with PMH below who presents today for hypertension evaluation. She was recently seen in the ED for back pain and SOB at which time her furosemide was increased. At her follow up Dr. Debara Pickett, she was changed to [facility]. She states she felt fine with both medications changes. However, after labs last week that revealed a decline in kidney function her Lasix and Entresto were put on hold. She reports she took the [facility] for about 7 days before putting it on hold.   Weights - today 213, yesterday 212, previously 211 (reports has been around same area always). She has notice a little fluid in her feet, but otherwise does not feel she is retaining fluid.   She also reports that her portion of the copay for Entresto was $230. She is unable to afford this.   Cardiac Hx: Cardiomegaly, HTN, CHF, non-ischemic cardiomyopathy, DM2, CKD, HLD  Current HTN meds:  Carvedilol 3.125mg  BID Furosemide 40mg  daily - ON HOLD  Entresto 24/26mg  BID - ON HOLD  Previously tried:  Captopril - changed to entresto   BP goal: <150/90  Family History: She reports she does not have any significant cardiac family history.  Social History: She quit smoking 20 years ago. She denies alcohol.   Diet: She prepares most of her meals from home. She avoids salt and does not add it at the table. She occasionally will drink a cup of coffee, tea, or soda.   Exercise: She does seated leg exercises and arm exercises when she thinks about it.   Home BP readings: no cuff at home.   Wt Readings from Last 3 Encounters:  07/14/16 217 lb 12.8 oz (98.8 kg)  04/13/16 222 lb (100.7 kg)  04/04/16 208 lb (94.3 kg)   BP Readings from Last 3 Encounters:  07/27/16 132/76  07/14/16 125/72  07/11/16 118/84   Pulse Readings from Last 3 Encounters:  07/27/16 75  07/14/16 82    07/11/16 76    Renal function: Estimated Creatinine Clearance: 21.8 mL/min (by C-G formula based on SCr of 2.69 mg/dL).  Past Medical History:  Diagnosis Date  . Anemia   . Arthritis   . CHF (congestive heart failure) ([place])   . Chronic kidney disease    Renal Insuffiency, see [facility] once a year  . Diabetes mellitus    type 2  . GERD (gastroesophageal reflux disease)   . History of rotator cuff tear    Torn right rotator cuff.  . History of spinal stenosis   . Hypertension   . Neuropathy associated with endocrine disorder [facility])     Current Outpatient Prescriptions on File Prior to Visit  Medication Sig Dispense Refill  . aspirin EC 81 MG EC tablet Take 1 tablet (81 mg total) by mouth daily. 30 tablet 0  . Calcium Carbonate-Vit D-Min (CALCIUM 1200 PO) Take 600 mg by mouth daily.     . carvedilol (COREG) 3.125 MG tablet Take 1 tablet (3.125 mg total) by mouth 2 (two) times daily with a meal. 180 tablet 1  . cholecalciferol (VITAMIN D) 1000 UNITS tablet Take 1,000 Units by mouth daily. Reported on 03/27/2016    .  docusate sodium (COLACE) 100 MG capsule Take 100 mg by mouth daily as needed for mild constipation.    . ferrous sulfate 325 (65 FE) MG EC tablet Take 325 mg by mouth daily.     Marland Kitchen glimepiride (AMARYL) 1 MG tablet Take 1 mg by mouth 2 (two) times daily.     . Multiple Vitamin (MULTIVITAMIN) tablet Take 1 tablet by mouth daily.      . nortriptyline (PAMELOR) 10 MG capsule Take 10 mg by mouth at bedtime.    Marland Kitchen omeprazole (PRILOSEC) 40 MG capsule Take 40 mg by mouth daily.      . potassium chloride (K-DUR) 10 MEQ tablet Take 1 tablet (10 mEq total) by mouth daily. 90 tablet 1  . pravastatin (PRAVACHOL) 40 MG tablet Take 40 mg by mouth daily.      Marland Kitchen PRESCRIPTION MEDICATION 1 drop every 3 (three) months. Eye drops to be used the day before, of , and after eye injections    . traMADol (ULTRAM) 50 MG tablet Take 50 mg by mouth every 6 (six) hours as needed for moderate  pain.   0  . furosemide (LASIX) 40 MG tablet Take 1 tablet (40 mg total) by mouth daily. (Patient not taking: Reported on 07/27/2016) 5 tablet 0  . sacubitril-valsartan (ENTRESTO) 24-26 MG Take 1 tablet by mouth 2 (two) times daily. (Patient not taking: Reported on 07/27/2016) 180 tablet 1   No current facility-administered medications on file prior to visit.     Allergies  Allergen Reactions  . Sulfa Antibiotics Swelling    Blood pressure 132/76, pulse 75.   Assessment/Plan: Hypertension: Her blood pressure today is at goal despite being off Entresto and furosemide. She will have a repeat BMET today. Will determine if she can go back on Entresto and furosemide once BMET results. Ideally she would be on Entresto for CHF. She was provided samples today in the event she is able to restart medication. Will have her submit to [facility] for coverage of medication. She will follow up in 2 weeks in hypertension clinic.    Thank you, Lelan Pons. Patterson Hammersmith, [facility]  07/27/2016 11:48 AM

## 2016-07-28 LAB — BASIC METABOLIC PANEL
BUN: 38 mg/dL — AB (ref 7–25)
CHLORIDE: 102 mmol/L (ref 98–110)
CO2: 21 mmol/L (ref 20–31)
CREATININE: 1.84 mg/dL — AB (ref 0.60–0.93)
Calcium: 10 mg/dL (ref 8.6–10.4)
Glucose, Bld: 95 mg/dL (ref 65–99)
POTASSIUM: 5 mmol/L (ref 3.5–5.3)
Sodium: 138 mmol/L (ref 135–146)

## 2016-07-28 LAB — BRAIN NATRIURETIC PEPTIDE: Brain Natriuretic Peptide: 516.1 pg/mL — ABNORMAL HIGH (ref <100)

## 2016-07-31 ENCOUNTER — Telehealth: Payer: Self-pay | Admitting: Pharmacist

## 2016-07-31 DIAGNOSIS — I5021 Acute systolic (congestive) heart failure: Secondary | ICD-10-CM

## 2016-07-31 NOTE — Telephone Encounter (Signed)
-----   Message from Pixie Casino, MD sent at 07/31/2016  3:08 PM EDT ----- Creatinine improved. Would consider restart Entresto 24/26 mg and DO NOT start lasix at this time. Recheck BMET in 1 week.  Dr. Lemmie Evens

## 2016-07-31 NOTE — Telephone Encounter (Signed)
Spoke to patient and made her aware of recommendations. She will come for BMET with BP appt on 9/22 as it is difficult for her to get to office. (about 10 days after resuming Entresto).   She has enough of the medication to last her until her follow up next week.   She states appreciation for call and will keep appt next week. Order for BMET entered.

## 2016-08-08 ENCOUNTER — Other Ambulatory Visit: Payer: Self-pay | Admitting: *Deleted

## 2016-08-08 ENCOUNTER — Encounter: Payer: Self-pay | Admitting: *Deleted

## 2016-08-08 NOTE — Patient Outreach (Signed)
[facility]) Care Management  08/08/2016   RYLAH FUKUDA 1941-03-11 [id number]  Subjective: RN Health Coach telephone call to patient.  [employer] compliance verified. Per patient she is having some swelling in her legs. Her weight at the Dr office is 221.4 today. She did not weigh on her home scale. Per patient the Dr has put a hold on the lasix and she has been started on Entresto. Per patient she is having problems affording it. She has spoke with the Dr and will be referred to the pharmacist at his office to see what additional funding help is available. RN discussed with patient that If needed she can be referred to [place]. Per patient she is to have a MRI of the heart but they can't do it because her kidney functions are elevated. Patient requested some information on it that she can sit and read. Per patient her back is not hurting today. Per patient she went to Dr today for sinus infection and the cleaning of her ears.   Patient has agreed to follow up outreach calls. Objective:   Current Medications:  Current Outpatient Prescriptions  Medication Sig Dispense Refill  . aspirin EC 81 MG EC tablet Take 1 tablet (81 mg total) by mouth daily. 30 tablet 0  . Calcium Carbonate-Vit D-Min (CALCIUM 1200 PO) Take 600 mg by mouth daily.     . carvedilol (COREG) 3.125 MG tablet Take 1 tablet (3.125 mg total) by mouth 2 (two) times daily with a meal. 180 tablet 1  . cholecalciferol (VITAMIN D) 1000 UNITS tablet Take 1,000 Units by mouth daily. Reported on 03/27/2016    . docusate sodium (COLACE) 100 MG capsule Take 100 mg by mouth daily as needed for mild constipation.    . ferrous sulfate 325 (65 FE) MG EC tablet Take 325 mg by mouth daily.     Marland Kitchen glimepiride (AMARYL) 1 MG tablet Take 1 mg by mouth 2 (two) times daily.     . Multiple Vitamin (MULTIVITAMIN) tablet Take 1 tablet by mouth daily.      . nortriptyline (PAMELOR) 10 MG capsule Take 10 mg by mouth at bedtime.    Marland Kitchen  omeprazole (PRILOSEC) 40 MG capsule Take 40 mg by mouth daily.      . potassium chloride (K-DUR) 10 MEQ tablet Take 1 tablet (10 mEq total) by mouth daily. 90 tablet 1  . pravastatin (PRAVACHOL) 40 MG tablet Take 40 mg by mouth daily.      Marland Kitchen PRESCRIPTION MEDICATION 1 drop every 3 (three) months. Eye drops to be used the day before, of , and after eye injections    . sacubitril-valsartan (ENTRESTO) 24-26 MG Take 1 tablet by mouth 2 (two) times daily. 180 tablet 1  . traMADol (ULTRAM) 50 MG tablet Take 50 mg by mouth every 6 (six) hours as needed for moderate pain.   0  . furosemide (LASIX) 40 MG tablet Take 1 tablet (40 mg total) by mouth daily. (Patient not taking: Reported on 08/08/2016) 5 tablet 0   No current facility-administered medications for this visit.     Functional Status:  In your present state of health, do you have any difficulty performing the following activities: 08/08/2016 06/30/2016  Hearing? N N  Vision? Y -  Difficulty concentrating or making decisions? N N  Walking or climbing stairs? Y Y  Dressing or bathing? N N  Doing errands, shopping? Y Y  Preparing Food and eating ? - N  Using the  Toilet? - N  In the past six months, have you accidently leaked urine? - N  Do you have problems with loss of bowel control? - N  Managing your Medications? - N  Managing your Finances? - N  Housekeeping or managing your Housekeeping? - N  Some recent data might be hidden    Fall/Depression Screening: PHQ 2/9 Scores 08/08/2016 06/30/2016 06/01/2016 03/27/2016  PHQ - 2 Score 0 0 0 0   THN CM Care Plan Problem One   Flowsheet Row Most Recent Value  Care Plan Problem One  Knowledge deficit in self management of congestive heart failure  Role Documenting the Problem One  [place] for Problem One  Active  [facility]N Long Term Goal (31-90 days)  Patient will not be readmitted to hospital within the next 90 days for congestive heart failure  Interventions for Problem One [place] reminded the patient to keep appointments with PCP and cardiologist. RN reminded patient the importance of taking medications as prescribed, RN monitored patient monthly by telephonic  [facility] CM Short Term Goal #3 (0-30 days)  Patient will be able to verbalize that she has checked into an excercise program within the next 30 days  Interventions for Short Tern Goal #3  RN discussed with patient about exercising. RN sent patient chair exercises. Patient is calling [employer] to check on programs available and cost  [facility] CM Short Term Goal #4 Met Date  08/08/16  Interventions for Short Term Goal #4  RN sent patient a list of healthy snacks. RN will follow up with discussion and teach back  [facility]N CM Short Term Goal #5 (0-30 days)  Patient will be able to verbalize an understanding of what BUN and creatinine means within the next 30 days  [facility]N CM Short Term Goal #5 Start Date  08/08/16  Interventions for Short Term Goal #5  RN sent patient educational material on BUN and creatine and renal insufficiency. Patient is awaiting testing and requesting information      Assessment:  Patient requested information on Kidney function test meaning Patient is pending MRI of heart Patient is documenting her daily weights Per patient she is reading information sent by RN Health Coach Patient refused referral to [place] at this time  Plan:  RN will send patient a new calendar book for documenting weights RN sent educational material renal function tests RN will follow up within the month of October  [facility] [place] Care Management [phone]

## 2016-08-11 ENCOUNTER — Ambulatory Visit (INDEPENDENT_AMBULATORY_CARE_PROVIDER_SITE_OTHER): Payer: Medicare Other | Admitting: Pharmacist

## 2016-08-11 ENCOUNTER — Other Ambulatory Visit: Payer: Self-pay | Admitting: Internal Medicine

## 2016-08-11 VITALS — BP 98/64 | HR 98

## 2016-08-11 DIAGNOSIS — I5021 Acute systolic (congestive) heart failure: Secondary | ICD-10-CM

## 2016-08-11 DIAGNOSIS — I10 Essential (primary) hypertension: Secondary | ICD-10-CM | POA: Diagnosis not present

## 2016-08-11 LAB — BASIC METABOLIC PANEL
BUN: 35 mg/dL — AB (ref 7–25)
CALCIUM: 9.3 mg/dL (ref 8.6–10.4)
CO2: 25 mmol/L (ref 20–31)
CREATININE: 1.72 mg/dL — AB (ref 0.60–0.93)
Chloride: 109 mmol/L (ref 98–110)
GLUCOSE: 84 mg/dL (ref 65–99)
POTASSIUM: 4.9 mmol/L (ref 3.5–5.3)
Sodium: 141 mmol/L (ref 135–146)

## 2016-08-11 NOTE — Progress Notes (Signed)
Patient ID: Mandy Rhodes                 DOB: Jun 04, 1941                      MRN: [id number]     HPI: Mandy Rhodes is a 75 y.o. female patient of Dr. Alain Honey with PMH below who presents today for medication titration. She recently restarted her Entresto after a slight decline in kidney function on both Entresto and increased dose of furosemide.   She reports feeling well since restarting the Entresto. She denies any symptoms of low blood pressure such as dizziness. She states she has minimal fluid retention. She reports it is not bad at all she just notices it is slightly more than when she took Lasix daily.  She inquires about stopping potassium since she stopped the Lasix, as her primary care doctor had recommended this to her.   She also reports that her portion of the copay for Entresto was $230. She is unable to afford this. She has about 4 weeks of medication left.  Cardiac Hx: Cardiomegaly, HTN, CHF, non-ischemic cardiomyopathy, DM2, CKD, HLD  Current HTN meds:  Carvedilol 3.125mg  BID Entresto 24/26mg  BID  Previously tried:  Captopril - changed to [employer]  Family History: She reports she does not have any significant cardiac family history.  Social History: She quit smoking 20 years ago. She denies alcohol.   Diet: She prepares most of her meals from home. She avoids salt and does not add it at the table. She occasionally will drink a cup of coffee, tea, or soda.   Exercise: She does seated leg exercises and arm exercises when she thinks about it.   Home BP readings: no cuff at home  Wt Readings from Last 3 Encounters:  07/14/16 217 lb 12.8 oz (98.8 kg)  04/13/16 222 lb (100.7 kg)  04/04/16 208 lb (94.3 kg)   BP Readings from Last 3 Encounters:  08/11/16 98/64  07/27/16 132/76  07/14/16 125/72   Pulse Readings from Last 3 Encounters:  08/11/16 98  07/27/16 75  07/14/16 82    Renal function: CrCl cannot be calculated (Unknown ideal weight.).  Past  Medical History:  Diagnosis Date  . Anemia   . Arthritis   . CHF (congestive heart failure) ([place])   . Chronic kidney disease    Renal Insuffiency, see [facility] once a year  . Diabetes mellitus    type 2  . GERD (gastroesophageal reflux disease)   . History of rotator cuff tear    Torn right rotator cuff.  . History of spinal stenosis   . Hypertension   . Neuropathy associated with endocrine disorder [facility])     Current Outpatient Prescriptions on File Prior to Visit  Medication Sig Dispense Refill  . aspirin EC 81 MG EC tablet Take 1 tablet (81 mg total) by mouth daily. 30 tablet 0  . Calcium Carbonate-Vit D-Min (CALCIUM 1200 PO) Take 600 mg by mouth daily.     . carvedilol (COREG) 3.125 MG tablet Take 1 tablet (3.125 mg total) by mouth 2 (two) times daily with a meal. 180 tablet 1  . cholecalciferol (VITAMIN D) 1000 UNITS tablet Take 1,000 Units by mouth daily. Reported on 03/27/2016    . docusate sodium (COLACE) 100 MG capsule Take 100 mg by mouth daily as needed for mild constipation.    . ferrous sulfate 325 (65 FE) MG EC tablet Take 325 mg  by mouth daily.     Marland Kitchen glimepiride (AMARYL) 1 MG tablet Take 1 mg by mouth 2 (two) times daily.     . Multiple Vitamin (MULTIVITAMIN) tablet Take 1 tablet by mouth daily.      . nortriptyline (PAMELOR) 10 MG capsule Take 10 mg by mouth at bedtime.    Marland Kitchen omeprazole (PRILOSEC) 40 MG capsule Take 40 mg by mouth daily.      . potassium chloride (K-DUR) 10 MEQ tablet Take 1 tablet (10 mEq total) by mouth daily. 90 tablet 1  . pravastatin (PRAVACHOL) 40 MG tablet Take 40 mg by mouth daily.      Marland Kitchen PRESCRIPTION MEDICATION 1 drop every 3 (three) months. Eye drops to be used the day before, of , and after eye injections    . sacubitril-valsartan (ENTRESTO) 24-26 MG Take 1 tablet by mouth 2 (two) times daily. 180 tablet 1  . traMADol (ULTRAM) 50 MG tablet Take 50 mg by mouth every 6 (six) hours as needed for moderate pain.   0  . furosemide (LASIX) 40  MG tablet Take 1 tablet (40 mg total) by mouth daily. (Patient not taking: Reported on 08/11/2016) 5 tablet 0   No current facility-administered medications on file prior to visit.     Allergies  Allergen Reactions  . Sulfa Antibiotics Swelling    Blood pressure 98/64, pulse 98.   Assessment/Plan: Medication titration: BP today is on low side. Will have her continue Entresto 24/26mg  BID as I am not sure her pressure will tolerate an increase in dose. Will have her discontinue potassium as her potassium was on the high side on her most recent BMET. Will get repeat BMET today to ensure kidney function appropriate after restart Entresto. Information on [employer] given and instructed her to call our office if she is unable to afford Entresto in the future as we may be able to help with samples or may need to change her over to a cheaper alternative.    Thank you, Lelan Pons. Patterson [place], [place]

## 2016-08-11 NOTE — Patient Instructions (Addendum)
Go to the lab for blood work today.  You may stop potassium. Continue all other medications as prescribed.   Follow up with Dr. Debara Pickett as scheduled.   If you are unable to afford your Delene Loll please call [phone] or go online at [email or web address] - the [email or web address].

## 2016-08-21 ENCOUNTER — Encounter: Payer: Self-pay | Admitting: Internal Medicine

## 2016-08-21 ENCOUNTER — Other Ambulatory Visit: Payer: Self-pay | Admitting: Family Medicine

## 2016-08-21 ENCOUNTER — Ambulatory Visit (INDEPENDENT_AMBULATORY_CARE_PROVIDER_SITE_OTHER): Payer: Medicare Other | Admitting: Internal Medicine

## 2016-08-21 VITALS — BP 98/62 | HR 58 | Ht 66.0 in | Wt 227.0 lb

## 2016-08-21 DIAGNOSIS — R609 Edema, unspecified: Secondary | ICD-10-CM

## 2016-08-21 DIAGNOSIS — I5021 Acute systolic (congestive) heart failure: Secondary | ICD-10-CM

## 2016-08-21 DIAGNOSIS — I5042 Chronic combined systolic (congestive) and diastolic (congestive) heart failure: Secondary | ICD-10-CM

## 2016-08-21 DIAGNOSIS — R0602 Shortness of breath: Secondary | ICD-10-CM | POA: Diagnosis not present

## 2016-08-21 DIAGNOSIS — G8929 Other chronic pain: Secondary | ICD-10-CM

## 2016-08-21 DIAGNOSIS — M545 Low back pain, unspecified: Secondary | ICD-10-CM

## 2016-08-21 MED ORDER — FUROSEMIDE 40 MG PO TABS: 40.0000 mg | ORAL_TABLET | Freq: Two times a day (BID) | ORAL | 0 refills | Status: DC

## 2016-08-21 MED ORDER — POTASSIUM CHLORIDE CRYS ER 20 MEQ PO TBCR: 20.0000 meq | EXTENDED_RELEASE_TABLET | Freq: Every day | ORAL | 3 refills | Status: DC

## 2016-08-21 NOTE — Patient Instructions (Addendum)
Medication Instructions:  STOP Coreg INCREASE Lasix to 40mg  Take take 1 tablet by mouth twice a day. INCREASE Potassium to 20MEQ Take 1 tablet Once a day  Labwork: Your physician recommends that you return for lab work in: COMPLETE A BMET ON Friday.   Testing/Procedures: NONE  Follow-Up: Your physician recommends that you schedule a follow-up appointment in: NEXT WEEK with EXTENDER.   Any Other Special Instructions Will Be Listed Below (If Applicable).   If you need a refill on your cardiac medications before your next appointment, please call your pharmacy.  NEXT APPT:  DATE:_____________________________________________  TIME:______________________________________AM/PM

## 2016-08-23 DIAGNOSIS — R609 Edema, unspecified: Secondary | ICD-10-CM | POA: Insufficient documentation

## 2016-08-23 NOTE — Progress Notes (Signed)
OFFICE NOTE  Chief Complaint:  Follow-up CHF  Primary Care Physician: Vidal Schwalbe, MD  HPI:  Mandy Rhodes is a 75 y.o. female was recently hospitalized for acute combined systolic and diastolic heart failure. Echocardiogram revealed a newly reduced EF of 35-40% with severe LVH was started on IV Lasix and diuresed. Her left heart catheterization revealing a 20% proximal LAD lesion Percent ostial ramus. Echocardiogram was suggestive of infiltrative cardiomyopathy of the septum. May need to consider cardiac MRI in the future. She saw Tenny Craw, PA-C in follow-up in May 2017. She seemed to be doing well at that time however her weight was going up. He increased her Lasix for a few days and then decreased it down to 20 mg QOD. She has been enrolled in home monitoring with [place] [facility] and recently had an increase in her weight over the past week. She was seen in the emergency department on August 22 for back pain and at that time was more short of breath and had some swelling. They increased her Lasix to 40 mg daily and she is currently on that dose. Weight is now down about 5 pounds and her breathing has improved somewhat.  08/21/2016  Mandy Rhodes returns today for follow-up. Her weight is 227, which is increased actually 10 pounds since her last office weight however she denies any worsening swelling. She reports her appetite is increased and this weight gain is related to gating weight back after her recent weight loss. We had referred her for cardiac MRI to evaluate for sarcoidosis however unfortunately her creatinine was markedly elevated over 2.6. Previously it had been 1.6. This was then canceled and we made adjustments on her medications including holding her ARB medication. She was also scheduled to be on Entresto. That was discontinued as well and renal function returned to normal. She was then restarted on low-dose Entresto by our hypertension clinic pharmacist and she will  need reassessment of her renal function.  PMHx:  Past Medical History:  Diagnosis Date  . Anemia   . Arthritis   . CHF (congestive heart failure) ([place])   . Chronic kidney disease    Renal Insuffiency, see [facility] once a year  . Diabetes mellitus    type 2  . GERD (gastroesophageal reflux disease)   . History of rotator cuff tear    Torn right rotator cuff.  . History of spinal stenosis   . Hypertension   . Neuropathy associated with endocrine disorder [facility])     Past Surgical History:  Procedure Laterality Date  . ABDOMINAL HYSTERECTOMY  1971   Partial  . APPENDECTOMY    . BACK SURGERY  2009   Disectomy  . CARDIAC CATHETERIZATION N/A 03/22/2016   Procedure: Left Heart Cath and Coronary Angiography;  Surgeon: Peter M Martinique, MD;  Location: [facility];  Service: Cardiovascular;  Laterality: N/A;  . CHOLECYSTECTOMY  1969   Status post   . CYST REMOVAL TRUNK Right 12/03/2014   Procedure: EXCISION OF RIGHT BACK CYST ;  Surgeon: Coralie Keens, MD;  Location: [facility];  Service: General;  Laterality: Right;  . FOOT SURGERY Bilateral    bone removed  . NEUROPLASTY / TRANSPOSITION MEDIAN NERVE AT CARPAL TUNNEL Bilateral    Hx  Left carpal tunnel repair    FAMHx:  Family History  Problem Relation Age of Onset  . Diabetes type II Mother   . Hypertension Father   . Diabetes type II Sister   .  Ovarian cancer Sister   . Diabetes type II Other   . Stroke Neg Hx   . CAD Neg Hx   . Heart failure Neg Hx     SOCHx:   reports that she has quit smoking. She has never used smokeless tobacco. She reports that she does not drink alcohol or use drugs.  ALLERGIES:  Allergies  Allergen Reactions  . Sulfa Antibiotics Swelling    ROS: Pertinent items noted in HPI and remainder of comprehensive ROS otherwise negative.  HOME MEDS: Current Outpatient Prescriptions  Medication Sig Dispense Refill  . aspirin EC 81 MG EC tablet Take 1 tablet (81 mg total) by mouth daily.  30 tablet 0  . Calcium Carbonate-Vit D-Min (CALCIUM 1200 PO) Take 600 mg by mouth daily.     . cholecalciferol (VITAMIN D) 1000 UNITS tablet Take 1,000 Units by mouth daily. Reported on 03/27/2016    . docusate sodium (COLACE) 100 MG capsule Take 100 mg by mouth daily as needed for mild constipation.    . ferrous sulfate 325 (65 FE) MG EC tablet Take 325 mg by mouth daily.     . furosemide (LASIX) 40 MG tablet Take 1 tablet (40 mg total) by mouth 2 (two) times daily. TAKE FOR 1 WEEK 60 tablet 0  . glimepiride (AMARYL) 1 MG tablet Take 1 mg by mouth 2 (two) times daily.     . Multiple Vitamin (MULTIVITAMIN) tablet Take 1 tablet by mouth daily.      . nortriptyline (PAMELOR) 10 MG capsule Take 10 mg by mouth at bedtime.    Marland Kitchen omeprazole (PRILOSEC) 40 MG capsule Take 40 mg by mouth daily.      Marland Kitchen oxyCODONE (OXY IR/ROXICODONE) 5 MG immediate release tablet Take 5 mg by mouth as needed for pain.    . potassium chloride SA (KLOR-CON M20) 20 MEQ tablet Take 1 tablet (20 mEq total) by mouth daily. 90 tablet 3  . pravastatin (PRAVACHOL) 40 MG tablet Take 40 mg by mouth daily.      Marland Kitchen PRESCRIPTION MEDICATION 1 drop every 3 (three) months. Eye drops to be used the day before, of , and after eye injections    . sacubitril-valsartan (ENTRESTO) 24-26 MG Take 1 tablet by mouth 2 (two) times daily. 180 tablet 1  . traMADol (ULTRAM) 50 MG tablet Take 50 mg by mouth every 6 (six) hours as needed for moderate pain.   0   No current facility-administered medications for this visit.     LABS/IMAGING: No results found for this or any previous visit (from the past 48 hour(s)). No results found.  WEIGHTS: Wt Readings from Last 3 Encounters:  08/21/16 227 lb (103 kg)  07/14/16 217 lb 12.8 oz (98.8 kg)  04/13/16 222 lb (100.7 kg)    VITALS: BP 98/62   Pulse (!) 58   Ht 5\' 6"  (1.676 m)   Wt 227 lb (103 kg)   BMI 36.64 kg/m   EXAM: General appearance: alert, no distress and moderately obese Neck: JVD - 3 cm  above sternal notch and no carotid bruit Lungs: clear to auscultation bilaterally Heart: regular rate and rhythm Abdomen: soft, non-tender; bowel sounds normal; no masses,  no organomegaly and obese Extremities: edema Trace to 1+ bilateral lower extremity Pulses: 2+ and symmetric Skin: Skin color, texture, turgor normal. No rashes or lesions Neurologic: Grossly normal Psych: Pleasant  EKG: Deferred  ASSESSMENT: 1. Acute on chronic systolic congestive heart failure, LVEF 35-40% 2. Nonischemic cardiomyopathy-possible infiltrative cardiomyopathy  based on echo features 3. Hypertension 4. Dyslipidemia 5. Type 2 diabetes 6. CKD2-3  PLAN: 1.   Mandy Rhodes has had some recent worsening renal function on ARB but seems to tolerate low-dose Entresto. We'll have to follow-up her blood work closely. Blood pressure is low normal today 98/62, however she does appear to be volume overloaded. I like to increase her Lasix to 40 mg twice a day. Plan to stop carvedilol to allow blood pressure room for diuresis. In addition will increase her potassium to 20 mEq daily. She'll need a repeat metabolic profile on Friday. We'll plan to have her follow-up with an extender next week. If she is not improving her renal functions worsening then she will likely need a nephrology consultation and adjustment in her medications.  Pixie Casino, MD, [facility] Attending Cardiologist Haliimaile C Quentin Strebel 08/23/2016, 6:32 PM

## 2016-08-26 LAB — BASIC METABOLIC PANEL
BUN: 42 mg/dL — ABNORMAL HIGH (ref 7–25)
CHLORIDE: 103 mmol/L (ref 98–110)
CO2: 26 mmol/L (ref 20–31)
CREATININE: 1.67 mg/dL — AB (ref 0.60–0.93)
Calcium: 10 mg/dL (ref 8.6–10.4)
Glucose, Bld: 76 mg/dL (ref 65–99)
POTASSIUM: 4.3 mmol/L (ref 3.5–5.3)
SODIUM: 139 mmol/L (ref 135–146)

## 2016-08-28 ENCOUNTER — Other Ambulatory Visit: Payer: Self-pay | Admitting: *Deleted

## 2016-08-28 ENCOUNTER — Ambulatory Visit (INDEPENDENT_AMBULATORY_CARE_PROVIDER_SITE_OTHER): Payer: Medicare Other | Admitting: Physician Assistant

## 2016-08-28 ENCOUNTER — Encounter: Payer: Self-pay | Admitting: Physician Assistant

## 2016-08-28 VITALS — BP 111/64 | HR 72 | Ht 66.0 in | Wt 215.8 lb

## 2016-08-28 DIAGNOSIS — N183 Chronic kidney disease, stage 3 unspecified: Secondary | ICD-10-CM

## 2016-08-28 DIAGNOSIS — I5043 Acute on chronic combined systolic (congestive) and diastolic (congestive) heart failure: Secondary | ICD-10-CM

## 2016-08-28 DIAGNOSIS — Z79899 Other long term (current) drug therapy: Secondary | ICD-10-CM

## 2016-08-28 DIAGNOSIS — I428 Other cardiomyopathies: Secondary | ICD-10-CM

## 2016-08-28 DIAGNOSIS — I10 Essential (primary) hypertension: Secondary | ICD-10-CM | POA: Diagnosis not present

## 2016-08-28 MED ORDER — FUROSEMIDE 40 MG PO TABS: 40.0000 mg | ORAL_TABLET | Freq: Two times a day (BID) | ORAL | 11 refills | Status: DC

## 2016-08-28 NOTE — Patient Instructions (Signed)
Medication Instructions:  Take and extra Furosemide and Potassium for 1 week  Labwork: None Ordered  Testing/Procedures: None Ordered  Follow-Up: Your physician recommends that you schedule a follow-up appointment in: 1 months with Dr Debara Pickett   Any Other Special Instructions Will Be Listed Below (If Applicable).   If you need a refill on your cardiac medications before your next appointment, please call your pharmacy.

## 2016-08-28 NOTE — Progress Notes (Signed)
Cardiology Office Note   Date:  08/28/2016   ID:  Mandy Rhodes, DOB Feb 08, 1941, MRN [id number]  PCP:  Vidal Schwalbe, MD  Cardiologist:  Dr Debara Pickett 08/21/2016 Rosaria Ferries, PA-C   Chief Complaint  Patient presents with  . Follow-up    1 week; lightheaed/dizziness. edema; in legs and feet.     History of Present Illness: Mandy Rhodes is a 75 y.o. female with a history of S-D-CHF, EF 35-40% echo 02/2016 (?infiltrative CM septum, may need MRI), cath w/ 20% LAD 03/2016. CKD III, Cr spiked 2.6 so no MRI yet, HTN, DM  Seen in office 10/02 and was volume overloaded, on Entresto, renal function being followed closely, early f/u 10/06 BMET and K+ 4.3 w/ BUN/Cr 42/1.67 (lower than previous values)  Mandy Rhodes presents for Early follow-up for her lower extremity edema.  Her lower extremity edema has improved. She still has dyspnea on exertion and her chronic musculoskeletal issues, so does not move around very much. She is in a wheelchair here in the office today and her daughter-in-law is with her. She still gets up and gets herself dressed in the mornings, but admits that this is hard for her and wishes she had an aide to help with this.   She feels she is doing pretty well with a low sodium diet. She does not add salt to foods and is reading labels to make low sodium choices. She has not had any chest pain. Her weight is down both on her home scales and on our scales. On our scales, it is down 12 pounds. On her home scales, it is down a similar amount. She states that she has been compliant with daily weights. She has not had chest pain or palpitations.   Past Medical History:  Diagnosis Date  . Anemia   . Arthritis   . CHF (congestive heart failure) ([place])   . Chronic kidney disease    Renal Insuffiency, see [facility] once a year  . Diabetes mellitus    type 2  . GERD (gastroesophageal reflux disease)   . History of rotator cuff tear    Torn right rotator  cuff.  . History of spinal stenosis   . Hypertension   . Neuropathy associated with endocrine disorder [facility])     Past Surgical History:  Procedure Laterality Date  . ABDOMINAL HYSTERECTOMY  1971   Partial  . APPENDECTOMY    . BACK SURGERY  2009   Disectomy  . CARDIAC CATHETERIZATION N/A 03/22/2016   Procedure: Left Heart Cath and Coronary Angiography;  Surgeon: Peter M Martinique, MD;  Location: [facility] [facility];  Service: Cardiovascular;  Laterality: N/A;  . CHOLECYSTECTOMY  1969   Status post   . CYST REMOVAL TRUNK Right 12/03/2014   Procedure: EXCISION OF RIGHT BACK CYST ;  Surgeon: Coralie Keens, MD;  Location: [facility];  Service: General;  Laterality: Right;  . FOOT SURGERY Bilateral    bone removed  . NEUROPLASTY / TRANSPOSITION MEDIAN NERVE AT CARPAL TUNNEL Bilateral    Hx  Left carpal tunnel repair    Current Outpatient Prescriptions  Medication Sig Dispense Refill  . aspirin EC 81 MG EC tablet Take 1 tablet (81 mg total) by mouth daily. 30 tablet 0  . Calcium Carbonate-Vit D-Min (CALCIUM 1200 PO) Take 600 mg by mouth daily.     . cholecalciferol (VITAMIN D) 1000 UNITS tablet Take 1,000 Units by mouth daily. Reported on 03/27/2016    .  docusate sodium (COLACE) 100 MG capsule Take 100 mg by mouth daily as needed for mild constipation.    . ferrous sulfate 325 (65 FE) MG EC tablet Take 325 mg by mouth daily.     . furosemide (LASIX) 40 MG tablet Take 1 tablet (40 mg total) by mouth 2 (two) times daily. TAKE FOR 1 WEEK 60 tablet 0  . glimepiride (AMARYL) 1 MG tablet Take 1 mg by mouth 2 (two) times daily.     . Multiple Vitamin (MULTIVITAMIN) tablet Take 1 tablet by mouth daily.      . nortriptyline (PAMELOR) 10 MG capsule Take 10 mg by mouth at bedtime.    Marland Kitchen omeprazole (PRILOSEC) 40 MG capsule Take 40 mg by mouth daily.      Marland Kitchen oxyCODONE (OXY IR/ROXICODONE) 5 MG immediate release tablet Take 5 mg by mouth as needed for pain.    . potassium chloride SA (KLOR-CON M20) 20 MEQ tablet  Take 1 tablet (20 mEq total) by mouth daily. 90 tablet 3  . pravastatin (PRAVACHOL) 40 MG tablet Take 40 mg by mouth daily.      Marland Kitchen PRESCRIPTION MEDICATION 1 drop every 3 (three) months. Eye drops to be used the day before, of , and after eye injections    . sacubitril-valsartan (ENTRESTO) 24-26 MG Take 1 tablet by mouth 2 (two) times daily. 180 tablet 1  . traMADol (ULTRAM) 50 MG tablet Take 50 mg by mouth every 6 (six) hours as needed for moderate pain.   0   No current facility-administered medications for this visit.     Allergies:   Sulfa antibiotics    Social History:  The patient  reports that she has quit smoking. She has never used smokeless tobacco. She reports that she does not drink alcohol or use drugs.   Family History:  The patient's family history includes Diabetes type II in her mother, other, and sister; Hypertension in her father; Ovarian cancer in her sister.    ROS:  Please see the history of present illness. All other systems are reviewed and negative.   PHYSICAL EXAM: VS:  BP 111/64   Pulse 72   Ht 5\' 6"  (1.676 m)   Wt 215 lb 12.8 oz (97.9 kg)   BMI 34.83 kg/m  , BMI Body mass index is 34.83 kg/m. GEN: Well nourished, well developed, female in no acute distress  HEENT: normal for age  Neck: minimal JVD, no carotid bruit, no masses Cardiac: RRR; soft murmur, no rubs, or gallops Respiratory: Decreased breath sounds bases but clear bilaterally, normal work of breathing GI: soft, nontender, nondistended, + BS MS: no deformity or atrophy; 2+ edema; distal pulses are 2+ in upper extremities, not palpable in lower extremities due to edema  Skin: warm and dry, no rash Neuro:  Generalized weakness, sensation are intact  EKG:  EKG is not ordered today.  Recent Labs: 03/17/2016: ALT 20 03/18/2016: Magnesium 1.7; TSH 2.970 03/21/2016: Hemoglobin 11.1; Platelets 178 07/27/2016: Brain Natriuretic Peptide 516.1 08/25/2016: BUN 42; Creat 1.67; Potassium 4.3; Sodium 139    BMET    Component Value Date/Time   NA 139 08/25/2016 1151   NA 139 01/13/2015 1016   K 4.3 08/25/2016 1151   K 4.2 01/13/2015 1016   CL 103 08/25/2016 1151   CL 108 (H) 12/27/2012 0849   CO2 26 08/25/2016 1151   CO2 24 01/13/2015 1016   GLUCOSE 76 08/25/2016 1151   GLUCOSE 106 01/13/2015 1016   GLUCOSE 108 (H)  12/27/2012 0849   BUN 42 (H) 08/25/2016 1151   BUN 32.8 (H) 01/13/2015 1016   CREATININE 1.67 (H) 08/25/2016 1151   CREATININE 2.1 (H) 01/13/2015 1016   CALCIUM 10.0 08/25/2016 1151   CALCIUM 9.9 01/13/2015 1016   GFRNONAA 16 (L) 07/20/2016 1105   GFRAA 19 (L) 07/20/2016 1105    Lipid Panel    Component Value Date/Time   CHOL 171 03/18/2016 0626   TRIG 77 03/18/2016 0626   HDL 56 03/18/2016 0626   CHOLHDL 3.1 03/18/2016 0626   VLDL 15 03/18/2016 0626   LDLCALC 100 (H) 03/18/2016 0626     Wt Readings from Last 3 Encounters:  08/28/16 215 lb 12.8 oz (97.9 kg)  08/21/16 227 lb (103 kg)  07/14/16 217 lb 12.8 oz (98.8 kg)     Other studies Reviewed: Additional studies/ records that were reviewed today include: Office notes, hospital records and testing.  ASSESSMENT AND PLAN:  1.  Acute on chronic combined systolic and diastolic CHF: Her symptoms are much improved and her weight is down 12 pounds. However, she still has significant volume overload. Her renal function remained stable/improved after the week of taking Lasix twice a day. We will do this for another week.   Therefore, she will take Lasix 40 mg twice a day for one week, then go back to 40 mg daily. She will increase her potassium tablets to twice a day while she is on twice a day Lasix. Every time she takes a Lasix tablet as needed for a weight increase or extra edema, she is to take an extra potassium.  She is encouraged to continue a low sodium diabetic diet as she is. She is encouraged to continue daily weights. Continued Entresto and carvedilol.  2. Hypertension: Her blood pressure and heart rate  are well controlled.   3. Chronic kidney disease stage III: The lab work that she had done after being started on the [facility] was stable. Recheck in 2-3 weeks. Or at her follow-up appointment with Dr. Debara Pickett.   Current medicines are reviewed at length with the patient today.  The patient does not have concerns regarding medicines.  The following changes have been made:  Continue twice a day Lasix and potassium for a week  Labs/ tests ordered today include:  BMET  Disposition:   FU with Dr Debara Pickett  Signed, Rosaria Ferries, PA-C  08/28/2016 2:51 PM    [place] Phone: [phone]; Fax: [phone]  This note was written with the assistance of speech recognition software. Please excuse any transcriptional errors.

## 2016-08-29 ENCOUNTER — Encounter: Payer: Self-pay | Admitting: Internal Medicine

## 2016-08-29 ENCOUNTER — Telehealth: Payer: Self-pay | Admitting: Internal Medicine

## 2016-08-29 NOTE — Telephone Encounter (Signed)
Called patient and gave her date, time and location of MRI.

## 2016-08-30 ENCOUNTER — Telehealth: Payer: Self-pay | Admitting: Internal Medicine

## 2016-08-30 NOTE — Telephone Encounter (Signed)
Cont.Marland KitchenMarland KitchenMarland KitchenMarland Kitchenpt states that she is unable to get assistance from PAN, pt states that she has a week worth of medication but cannot afford the rx for the entresto. Please advise.

## 2016-08-30 NOTE — Telephone Encounter (Signed)
New message       Patient calling the office for samples of medication:   1.  What medication and dosage are you requesting samples for?  entresto 24-26  2.  Are you currently out of this medication? Almost out

## 2016-08-30 NOTE — Telephone Encounter (Signed)
Spoke with pt states that she has 1week left of her Mandy Rhodes she states that this medication is too expensive for her for 90/days will cost $278. She states that she called the entresto assistance program and they are not taking anyone else for their program. She tried to apply for the [facility] program and cant "get thru" to apply for this program.

## 2016-08-31 MED ORDER — CAPTOPRIL 12.5 MG PO TABS: 12.5000 mg | ORAL_TABLET | Freq: Two times a day (BID) | ORAL | 3 refills | Status: DC

## 2016-08-31 NOTE — Telephone Encounter (Signed)
If she is unable to afford Entresto we will need switch her back to a more affordable agent. We can consider a change back to captopril. Or maybe an ARB. Currently there is no recommendation for change back to an ACEi. Thus I would recommend holding for at least 24 hours before resuming captopril.

## 2016-08-31 NOTE — Telephone Encounter (Signed)
lmtcb

## 2016-08-31 NOTE — Telephone Encounter (Signed)
Pt notified, new rx for captopril sent to mail in pharmacy as requested

## 2016-09-01 ENCOUNTER — Telehealth: Payer: Self-pay | Admitting: Internal Medicine

## 2016-09-01 ENCOUNTER — Ambulatory Visit
Admission: RE | Admit: 2016-09-01 | Discharge: 2016-09-01 | Disposition: A | Discharge status: Home / Self Care | Payer: Medicare Other | Source: Ambulatory Visit | Attending: Family Medicine | Admitting: Family Medicine

## 2016-09-01 DIAGNOSIS — G8929 Other chronic pain: Secondary | ICD-10-CM

## 2016-09-01 DIAGNOSIS — M545 Low back pain: Principal | ICD-10-CM

## 2016-09-01 MED ORDER — POTASSIUM CHLORIDE ER 10 MEQ PO TBCR: 10.0000 meq | EXTENDED_RELEASE_TABLET | Freq: Two times a day (BID) | ORAL | 1 refills | Status: DC

## 2016-09-01 MED ORDER — POTASSIUM CHLORIDE ER 10 MEQ PO TBCR: 10.0000 meq | EXTENDED_RELEASE_TABLET | Freq: Two times a day (BID) | ORAL | 3 refills | Status: DC

## 2016-09-01 NOTE — Telephone Encounter (Signed)
Prescriptions sent at patient request to [facility] and local [facility] for pickup today. She voiced thanks and acknowledged.

## 2016-09-01 NOTE — Telephone Encounter (Signed)
Thanks Georgina Peer.  Dr. Lemmie Evens

## 2016-09-01 NOTE — Telephone Encounter (Signed)
Pt called needs her Potassium chloride refilled.   Please call pt when done.  She would like to pick it up today.

## 2016-09-02 ENCOUNTER — Other Ambulatory Visit: Payer: Self-pay | Admitting: Internal Medicine

## 2016-09-04 ENCOUNTER — Telehealth: Payer: Self-pay | Admitting: Internal Medicine

## 2016-09-04 MED ORDER — POTASSIUM CHLORIDE CRYS ER 20 MEQ PO TBCR: 20.0000 meq | EXTENDED_RELEASE_TABLET | Freq: Every day | ORAL | 3 refills | Status: DC

## 2016-09-04 MED ORDER — FUROSEMIDE 40 MG PO TABS: 40.0000 mg | ORAL_TABLET | Freq: Every day | ORAL | 3 refills | Status: DC

## 2016-09-04 NOTE — Telephone Encounter (Signed)
Mandy Rhodes is calling because was her Potassium went up to 40mg  , where she was taking 20mg  in the morning and 20mg  in the evening . Now her prescription says 40mg 's . (2) States that her prescription should have been filled for 30 days which was $50 and she gets a 90 day supply from [facility] for free. Marland Kitchen Please Call

## 2016-09-04 NOTE — Telephone Encounter (Signed)
PATIENT CALLED IN REGARDS TO MEDICATION SHE SHOULD BE TAKING. Patient states she received Rx from [facility] 09/01/16 for furosemide 40 mg one tablet twice a day ,one week # 60. She states she did not received potassium Tablets.    She states that is not what Dr Debara Pickett HAD INFORMED her to do at last visit. Patient has been taking 20 meq twice a day since visit per patient. Patient states she is out of potassium tablets at present time.  No swelling noted. Patient wanted to know if she should be taking 40 mg twice a day since she just received the medication.  RN reviewed last to office notes with patient 08/21/16 and 08/28/16.  RN informed patient if she has no swelling at present time. Take 40 mg furosemide daily with 20 meq of potassium as prescribed. If weight gain is more than 3 lbs in night time. Take an extra 40 mg furosemide and extra potassium 20 meq that day.  Patient verbalized understanding. Patient wanted a RX for #30 day [facility]q [facility] AND [facility] supply to  [facility].   Orders placed an medication list changed

## 2016-09-06 ENCOUNTER — Other Ambulatory Visit: Payer: Self-pay | Admitting: *Deleted

## 2016-09-06 ENCOUNTER — Encounter: Payer: Self-pay | Admitting: *Deleted

## 2016-09-06 DIAGNOSIS — I5042 Chronic combined systolic (congestive) and diastolic (congestive) heart failure: Secondary | ICD-10-CM

## 2016-09-06 NOTE — Patient Outreach (Signed)
[facility]) Care Management  09/06/2016   Mandy Rhodes [birth year] [id number]  Subjective: RN Health Coach telephone call to patient.  [employer] compliance verified. Per patient she is in the green zone. She does not have any swelling at this time in her lower extremities. Patient stated she is having a lot of drainage in her throat from sinus. Per patient she called the Dr and was going to get some mucinex to take. Patient stated she received educational information sent by RN Health Coach. Per patient she is documenting in the calendar book. Per patient her weight today is 206.6. Patient stated she was on a medication called Entresto and she couldn't afford it. Patient has agreed to follow up outreach calls.   Objective:   Current Medications:  Current Outpatient Prescriptions  Medication Sig Dispense Refill  . aspirin EC 81 MG EC tablet Take 1 tablet (81 mg total) by mouth daily. 30 tablet 0  . Calcium Carbonate-Vit D-Min (CALCIUM 1200 PO) Take 600 mg by mouth daily.     . captopril (CAPOTEN) 12.5 MG tablet Take 1 tablet (12.5 mg total) by mouth 2 (two) times daily. 180 tablet 3  . carvedilol (COREG) 3.125 MG tablet     . cholecalciferol (VITAMIN D) 1000 UNITS tablet Take 1,000 Units by mouth daily. Reported on 03/27/2016    . docusate sodium (COLACE) 100 MG capsule Take 100 mg by mouth daily as needed for mild constipation.    . ferrous sulfate 325 (65 FE) MG EC tablet Take 325 mg by mouth daily.     . furosemide (LASIX) 40 MG tablet Take 1 tablet (40 mg total) by mouth daily. OK TO TAKE ONE ADDITIONAL TABLET AS NEEDED FOR WEIGHT GAIN 100 tablet 3  . glimepiride (AMARYL) 1 MG tablet Take 1 mg by mouth 2 (two) times daily.     . Multiple Vitamin (MULTIVITAMIN) tablet Take 1 tablet by mouth daily.      . nortriptyline (PAMELOR) 10 MG capsule Take 10 mg by mouth at bedtime.    Marland Kitchen omeprazole (PRILOSEC) 40 MG capsule Take 40 mg by mouth daily.      Marland Kitchen oxyCODONE (OXY  IR/ROXICODONE) 5 MG immediate release tablet Take 5 mg by mouth as needed for pain.    . potassium chloride SA (K-DUR,KLOR-CON) 20 MEQ tablet Take 1 tablet (20 mEq total) by mouth daily. May take an extra tablet if extra furosemide is taken 30 tablet 3  . potassium chloride SA (K-DUR,KLOR-CON) 20 MEQ tablet Take 1 tablet (20 mEq total) by mouth daily. May take an extra tablet if extra furosemide is taken. 90 tablet 3  . pravastatin (PRAVACHOL) 40 MG tablet Take 40 mg by mouth daily.      Marland Kitchen PRESCRIPTION MEDICATION 1 drop every 3 (three) months. Eye drops to be used the day before, of , and after eye injections    . traMADol (ULTRAM) 50 MG tablet Take 50 mg by mouth every 6 (six) hours as needed for moderate pain.   0   No current facility-administered medications for this visit.     Functional Status:  In your present state of health, do you have any difficulty performing the following activities: 09/06/2016 09/06/2016  Hearing? N N  Vision? Y Y  Difficulty concentrating or making decisions? N N  Walking or climbing stairs? Y Y  Dressing or bathing? N N  Doing errands, shopping? [name]  Preparing Food and eating ? N -  Using the Toilet?  N -  In the past six months, have you accidently leaked urine? N -  Do you have problems with loss of bowel control? - -  Managing your Medications? - -  Managing your Finances? - -  Housekeeping or managing your Housekeeping? - -  Some recent data might be hidden    Fall/Depression Screening: PHQ 2/9 Scores 09/06/2016 08/08/2016 06/30/2016 06/01/2016 03/27/2016  PHQ - 2 Score 0 0 0 0 0   TH[facility] CM Care Plan Problem One   Flowsheet Row Most Recent Value  Care Plan Problem One  Knowledge deficit in self management of congestive heart failure  Role Documenting the Problem One  [place] for Problem One  Active  [facility]N Long Term Goal (31-90 days)  Patient will not be readmitted to hospital within the next 90 days for congestive heart failure   Interventions for Problem One [place] reminded the patient to keep appointments with PCP and cardiologist. RN reminded patient the importance of taking medications as prescribed, RN monitored patient monthly by telephonic  THN CM Short Term Goal #1 (0-30 days)  Patient will be able to verbalize taking medications as prescribed within the next 30 days  THN CM Short Term Goal #1 Start Date  09/06/16  Interventions for Short Term Goal #1  RN discussed with patient about medication adherence. RN referred to pharmacy regarding entresto. RN sent patient information on the PAN foundation.  THN CM Short Term Goal #2 (0-30 days)  Patient will report understanding what serving sizes mean  THN CM Short Term Goal #2 Start Date  09/06/16  Interventions for Short Term Goal #2  RN sent educational material on serving sizes, eating healthy on a budget and foods to lower your triglycerides.RN will follow up with discussiona and teachback   THN CM Short Term Goal #5 Met Date  09/06/16      Assessment:  Patient is in green zone Patient  unable to afford medication Patient is trying to eat Healthier and loose weight Patient will benefit from Health Coach telephonic outreach for education and support for Congestive Heart Failure self management.   Plan:  RN sent patient information on the [place] assistance program Referred to pharmacy RN sent educational material on serving sizes RN sent educational material on Eating on a budget RN sent educational material on  Healthy foods to decrease Triglycerides RN will follow up within the month of November  [name] Clarence Management [phone]

## 2016-09-08 ENCOUNTER — Ambulatory Visit (HOSPITAL_COMMUNITY)
Admission: RE | Admit: 2016-09-08 | Discharge: 2016-09-08 | Disposition: A | Discharge status: Home / Self Care | Payer: Medicare Other | Source: Ambulatory Visit | Attending: Internal Medicine | Admitting: Internal Medicine

## 2016-09-08 DIAGNOSIS — I428 Other cardiomyopathies: Secondary | ICD-10-CM

## 2016-09-08 DIAGNOSIS — I517 Cardiomegaly: Secondary | ICD-10-CM | POA: Insufficient documentation

## 2016-09-08 DIAGNOSIS — I340 Nonrheumatic mitral (valve) insufficiency: Secondary | ICD-10-CM | POA: Diagnosis not present

## 2016-09-08 LAB — CREATININE, SERUM
Creatinine, Ser: 1.81 mg/dL — ABNORMAL HIGH (ref 0.44–1.00)
GFR calc Af Amer: 30 mL/min — ABNORMAL LOW (ref >60)
GFR calc non Af Amer: 26 mL/min — ABNORMAL LOW (ref >60)

## 2016-09-08 MED ORDER — GADOBENATE DIMEGLUMINE 529 MG/ML IV SOLN
15.0000 mL | Freq: Once | INTRAVENOUS | Status: AC | PRN
  Administered 2016-09-08: 15 mL via INTRAVENOUS

## 2016-09-13 ENCOUNTER — Telehealth: Payer: Self-pay | Admitting: *Deleted

## 2016-09-13 ENCOUNTER — Encounter: Payer: Self-pay | Admitting: *Deleted

## 2016-09-13 DIAGNOSIS — R9389 Abnormal findings on diagnostic imaging of other specified body structures: Secondary | ICD-10-CM

## 2016-09-13 NOTE — Progress Notes (Signed)
This encounter was created in error - please disregard.

## 2016-09-13 NOTE — Telephone Encounter (Signed)
Left message for [facility] to call.

## 2016-09-13 NOTE — Telephone Encounter (Signed)
-----   Message from Pixie Casino, MD sent at 09/11/2016  2:07 PM EDT ----- MRI suggestive of cardiac amyloidosis. LVEF 42%. Abdominal fat pad needle biopsy recommended to confirm the diagnosis of amyloidosis - not sure who does this - please try referral to [facility] for this.   Dr. Lemmie Evens

## 2016-09-14 NOTE — Telephone Encounter (Signed)
Spoke with [facility], they do not do the . Left message for interventional radiology to call.

## 2016-09-15 NOTE — Telephone Encounter (Signed)
Spoke with interventional radiology, order placed for biopsy. They are to call me back to schedule.

## 2016-09-18 NOTE — Telephone Encounter (Signed)
Returning Debra's call from Friday. °

## 2016-09-18 NOTE — Telephone Encounter (Signed)
Spoke w patient. She got call for biopsy appt - no further questions. Notes she was unfamiliar w amyloidosis diagnosis and was going to have her daughter in law Nia call back and states she was giving verbal permission to talk to daughter in law regarding her health.

## 2016-09-18 NOTE — Telephone Encounter (Signed)
Left msg to call.

## 2016-09-19 ENCOUNTER — Encounter: Payer: Self-pay | Admitting: Internal Medicine

## 2016-09-19 NOTE — Telephone Encounter (Signed)
F/u  Please call daughter-in-law Mandy Rhodes at [phone] and help her understand what diagnosticc procedure Mandy Rhodes is having done.  The patient does not comprehend what is being said to her.

## 2016-09-19 NOTE — Telephone Encounter (Signed)
This encounter was created in error - please disregard.

## 2016-09-19 NOTE — Telephone Encounter (Signed)
No answer- No answering machine 

## 2016-09-19 NOTE — Telephone Encounter (Signed)
Returned call to patient's daughter in law, Maree Erie, ok per verbal permission of patient from previous phone call. Mia expressed patient is apprehensvice about the upcomin procedure as she thinks they are going to take a biopsy of her heart. Explained that this is a biopsy of the of the fatty tissue in her stomach not the heart. Mia was relieved and will also let patient know as well. Duaghter in law asked what would be the treatment of amyloidosis. I advised I didn't have the answer to that question but would advise to wait until results and discuss with Dr Debara Pickett at f/u appt in November. She verbalized understanding.

## 2016-09-22 ENCOUNTER — Other Ambulatory Visit: Payer: Self-pay | Admitting: Physician Assistant

## 2016-09-22 ENCOUNTER — Other Ambulatory Visit: Payer: Self-pay | Admitting: Radiology

## 2016-09-25 ENCOUNTER — Ambulatory Visit (HOSPITAL_COMMUNITY)
Admission: RE | Admit: 2016-09-25 | Discharge: 2016-09-25 | Disposition: A | Discharge status: Home / Self Care | Payer: Medicare Other | Source: Ambulatory Visit | Attending: Internal Medicine | Admitting: Internal Medicine

## 2016-09-25 ENCOUNTER — Other Ambulatory Visit: Payer: Self-pay | Admitting: Radiology

## 2016-09-25 ENCOUNTER — Encounter (HOSPITAL_COMMUNITY): Payer: Self-pay

## 2016-09-25 DIAGNOSIS — Z7982 Long term (current) use of aspirin: Secondary | ICD-10-CM | POA: Diagnosis not present

## 2016-09-25 DIAGNOSIS — I5040 Unspecified combined systolic (congestive) and diastolic (congestive) heart failure: Secondary | ICD-10-CM | POA: Insufficient documentation

## 2016-09-25 DIAGNOSIS — I428 Other cardiomyopathies: Secondary | ICD-10-CM | POA: Diagnosis not present

## 2016-09-25 DIAGNOSIS — E1140 Type 2 diabetes mellitus with diabetic neuropathy, unspecified: Secondary | ICD-10-CM | POA: Insufficient documentation

## 2016-09-25 DIAGNOSIS — Z7984 Long term (current) use of oral hypoglycemic drugs: Secondary | ICD-10-CM | POA: Insufficient documentation

## 2016-09-25 DIAGNOSIS — Z882 Allergy status to sulfonamides status: Secondary | ICD-10-CM | POA: Insufficient documentation

## 2016-09-25 DIAGNOSIS — Z833 Family history of diabetes mellitus: Secondary | ICD-10-CM | POA: Insufficient documentation

## 2016-09-25 DIAGNOSIS — Z87891 Personal history of nicotine dependence: Secondary | ICD-10-CM | POA: Insufficient documentation

## 2016-09-25 DIAGNOSIS — Z8249 Family history of ischemic heart disease and other diseases of the circulatory system: Secondary | ICD-10-CM | POA: Diagnosis not present

## 2016-09-25 DIAGNOSIS — K219 Gastro-esophageal reflux disease without esophagitis: Secondary | ICD-10-CM | POA: Insufficient documentation

## 2016-09-25 DIAGNOSIS — Z79899 Other long term (current) drug therapy: Secondary | ICD-10-CM | POA: Diagnosis not present

## 2016-09-25 DIAGNOSIS — E1122 Type 2 diabetes mellitus with diabetic chronic kidney disease: Secondary | ICD-10-CM | POA: Diagnosis not present

## 2016-09-25 DIAGNOSIS — M1990 Unspecified osteoarthritis, unspecified site: Secondary | ICD-10-CM | POA: Diagnosis not present

## 2016-09-25 DIAGNOSIS — R9389 Abnormal findings on diagnostic imaging of other specified body structures: Secondary | ICD-10-CM

## 2016-09-25 DIAGNOSIS — N189 Chronic kidney disease, unspecified: Secondary | ICD-10-CM | POA: Insufficient documentation

## 2016-09-25 DIAGNOSIS — I130 Hypertensive heart and chronic kidney disease with heart failure and stage 1 through stage 4 chronic kidney disease, or unspecified chronic kidney disease: Secondary | ICD-10-CM | POA: Diagnosis not present

## 2016-09-25 LAB — CBC
HEMATOCRIT: 32.3 % — AB (ref 36.0–46.0)
Hemoglobin: 10.4 g/dL — ABNORMAL LOW (ref 12.0–15.0)
MCH: 28.1 pg (ref 26.0–34.0)
MCHC: 32.2 g/dL (ref 30.0–36.0)
MCV: 87.3 fL (ref 78.0–100.0)
PLATELETS: 233 10*3/uL (ref 150–400)
RBC: 3.7 MIL/uL — AB (ref 3.87–5.11)
RDW: 15.2 % (ref 11.5–15.5)
WBC: 6.8 10*3/uL (ref 4.0–10.5)

## 2016-09-25 LAB — APTT: APTT: 30 s (ref 24–36)

## 2016-09-25 LAB — PROTIME-INR
INR: 1.31
Prothrombin Time: 16.4 seconds — ABNORMAL HIGH (ref 11.4–15.2)

## 2016-09-25 MED ORDER — SODIUM CHLORIDE 0.9 % IV SOLN
INTRAVENOUS | Status: DC
  Administered 2016-09-25: 12:00:00 via INTRAVENOUS

## 2016-09-25 MED ORDER — FENTANYL CITRATE (PF) 100 MCG/2ML IJ SOLN
INTRAMUSCULAR | Status: AC
  Filled 2016-09-25: qty 2

## 2016-09-25 MED ORDER — MIDAZOLAM HCL 2 MG/2ML IJ SOLN
INTRAMUSCULAR | Status: AC | PRN
  Administered 2016-09-25 (×2): 0.5 mg via INTRAVENOUS

## 2016-09-25 MED ORDER — MIDAZOLAM HCL 2 MG/2ML IJ SOLN
INTRAMUSCULAR | Status: AC
  Filled 2016-09-25: qty 2

## 2016-09-25 MED ORDER — FLUMAZENIL 1 MG/10ML IV SOLN
INTRAVENOUS | Status: AC
  Filled 2016-09-25: qty 10

## 2016-09-25 MED ORDER — LIDOCAINE HCL 1 % IJ SOLN
INTRAMUSCULAR | Status: AC
  Filled 2016-09-25: qty 20

## 2016-09-25 MED ORDER — FENTANYL CITRATE (PF) 100 MCG/2ML IJ SOLN
INTRAMUSCULAR | Status: AC | PRN
  Administered 2016-09-25 (×2): 25 ug via INTRAVENOUS

## 2016-09-25 NOTE — Discharge Instructions (Signed)
Needle Biopsy, Care After °These instructions give you information about caring for yourself after your procedure. Your doctor may also give you more specific instructions. Call your doctor if you have any problems or questions after your procedure. °HOME CARE °· Rest as told by your doctor. °· Take medicines only as told by your doctor. °· There are many different ways to close and cover the biopsy site, including stitches (sutures), skin glue, and adhesive strips. Follow instructions from your doctor about: °¨ How to take care of your biopsy site. °¨ When and how you should change your bandage (dressing). °¨ When you should remove your dressing. °¨ Removing whatever was used to close your biopsy site. °· Check your biopsy site every day for signs of infection. Watch for: °¨ Redness, swelling, or pain. °¨ Fluid, blood, or pus. °GET HELP IF: °· You have a fever. °· You have redness, swelling, or pain at the biopsy site, and it lasts longer than a few days. °· You have fluid, blood, or pus coming from the biopsy site. °· You feel sick to your stomach (nauseous). °· You throw up (vomit). °GET HELP RIGHT AWAY IF: °· You are short of breath. °· You have trouble breathing. °· Your chest hurts. °· You feel dizzy or you pass out (faint). °· You have bleeding that does not stop with pressure or a bandage. °· You cough up blood. °· Your belly (abdomen) hurts. °  °This information is not intended to replace advice given to you by your health care provider. Make sure you discuss any questions you have with your health care provider. °  °Document Released: 10/19/2008 Document Revised: 03/23/2015 Document Reviewed: 11/02/2014 °Elsevier Interactive Patient Education ©2016 [employer] ° °

## 2016-09-25 NOTE — H&P (Signed)
Chief Complaint: Patient was seen in consultation today for fat pad biopsy at the request of Hilty,Kenneth C  Referring Physician(s): Pixie Casino  Supervising Physician: Corrie Mckusick  Patient Status: [facility] - In-pt  History of Present Illness: Mandy Rhodes is a 75 y.o. female   Systolic and diastolic congestive heart failure Dizzy; edema Still volume overloaded Non ischemic cardiomyopathy MRI: 09/08/16 IMPRESSION: 1. Moderately dilated left ventricle with severe concentric hypertrophy (14-16 mm) and mildly decreased systolic function (LVEF = 42%) with diffuse hypokinesis. There is diffuse early and late gadolinium uptake in the left ventricular myocardium. 2. Mildly dilated right ventricle with moderate right ventricular hypertrophy and mildly reduced systolic function (RVEF = 37%). 3.  Moderately dilated left atrium and mildly dilated right atrium. 4.  Mild mitral regurgitation. 5.  Dilated pulmonary artery measuring 32 mm. Collectively, these findings are highly suspicious for cardiac amyloidosis with diffuse biventricular involvement. Further evaluation with abdominal fat pad biopsy is recommended.  Now scheduled for fat pad bx  Past Medical History:  Diagnosis Date  . Anemia   . Arthritis   . CHF (congestive heart failure) ([place])   . Chronic kidney disease    Renal Insuffiency, see [facility] once a year  . Diabetes mellitus    type 2  . GERD (gastroesophageal reflux disease)   . History of rotator cuff tear    Torn right rotator cuff.  . History of spinal stenosis   . Hypertension   . Neuropathy associated with endocrine disorder [facility])     Past Surgical History:  Procedure Laterality Date  . ABDOMINAL HYSTERECTOMY  1971   Partial  . APPENDECTOMY    . BACK SURGERY  2009   Disectomy  . CARDIAC CATHETERIZATION N/A 03/22/2016   Procedure: Left Heart Cath and Coronary Angiography;  Surgeon: Peter M Martinique, MD;  Location: [place] CV LAB;   Service: Cardiovascular;  Laterality: N/A;  . CHOLECYSTECTOMY  1969   Status post   . CYST REMOVAL TRUNK Right 12/03/2014   Procedure: EXCISION OF RIGHT BACK CYST ;  Surgeon: Coralie Keens, MD;  Location: [place];  Service: General;  Laterality: Right;  . FOOT SURGERY Bilateral    bone removed  . NEUROPLASTY / TRANSPOSITION MEDIAN NERVE AT CARPAL TUNNEL Bilateral    Hx  Left carpal tunnel repair    Allergies: Sulfa antibiotics  Medications: Prior to Admission medications   Medication Sig Start Date End Date Taking? Authorizing Provider  aspirin EC 81 MG EC tablet Take 1 tablet (81 mg total) by mouth daily. 03/23/16  Yes Shanker Kristeen Mans, MD  Calcium Carbonate-Vit D-Min (CALCIUM 1200 PO) Take 600 mg by mouth daily.    Yes Historical Provider, MD  captopril (CAPOTEN) 12.5 MG tablet Take 1 tablet (12.5 mg total) by mouth 2 (two) times daily. 08/31/16  Yes Pixie Casino, MD  cholecalciferol (VITAMIN D) 1000 UNITS tablet Take 1,000 Units by mouth daily. Reported on 03/27/2016   Yes Historical Provider, MD  docusate sodium (COLACE) 100 MG capsule Take 100 mg by mouth daily as needed for mild constipation.   Yes Historical Provider, MD  ferrous sulfate 325 (65 FE) MG EC tablet Take 325 mg by mouth daily.    Yes Historical Provider, MD  furosemide (LASIX) 40 MG tablet Take 1 tablet (40 mg total) by mouth daily. OK TO TAKE ONE ADDITIONAL TABLET AS NEEDED FOR WEIGHT GAIN Patient taking differently: Take 40 mg by mouth every other day. OK TO TAKE  ONE ADDITIONAL TABLET AS NEEDED FOR WEIGHT GAIN 09/04/16  Yes Rhonda G Barrett, PA-C  glimepiride (AMARYL) 1 MG tablet Take 1 mg by mouth daily with breakfast.  02/15/16  Yes Historical Provider, MD  Multiple Vitamin (MULTIVITAMIN) tablet Take 1 tablet by mouth daily.     Yes Historical Provider, MD  nortriptyline (PAMELOR) 10 MG capsule Take 10 mg by mouth at bedtime.   Yes Historical Provider, MD  omeprazole (PRILOSEC) 40 MG capsule Take 40 mg by mouth  daily.     Yes Historical Provider, MD  potassium chloride SA (K-DUR,KLOR-CON) 20 MEQ tablet Take 1 tablet (20 mEq total) by mouth daily. May take an extra tablet if extra furosemide is taken Patient taking differently: Take 20 mEq by mouth every other day. May take an extra tablet if extra furosemide is taken 09/04/16  Yes Pixie Casino, MD  pravastatin (PRAVACHOL) 40 MG tablet Take 40 mg by mouth daily.     Yes Historical Provider, MD  PRESCRIPTION MEDICATION 1 drop every 3 (three) months. Eye drops to be used the day before, of , and after eye injections   Yes Historical Provider, MD     Family History  Problem Relation Age of Onset  . Diabetes type II Mother   . Hypertension Father   . Diabetes type II Sister   . Ovarian cancer Sister   . Diabetes type II Other   . Stroke Neg Hx   . CAD Neg Hx   . Heart failure Neg Hx     Social History   Social History  . Marital status: Divorced    Spouse name: N/A  . Number of children: N/A  . Years of education: N/A   Social History Main Topics  . Smoking status: Former Research scientist (life sciences)  . Smokeless tobacco: Never Used     Comment: 12/02/13- quit over 20 years ago  . Alcohol use No  . Drug use: No  . Sexual activity: Not Asked   Other Topics Concern  . None   Social History Narrative  . None     Review of Systems: A 12 point ROS discussed and pertinent positives are indicated in the HPI above.  All other systems are negative.  Review of Systems  Constitutional: Positive for activity change and fatigue. Negative for fever.  Respiratory: Positive for shortness of breath and wheezing.   Cardiovascular: Positive for leg swelling. Negative for chest pain.  Gastrointestinal: Negative for abdominal pain.  Neurological: Positive for dizziness and weakness. Negative for tremors, seizures, syncope, facial asymmetry, speech difficulty, light-headedness, numbness and headaches.  Psychiatric/Behavioral: Negative for behavioral problems and  confusion.    Vital Signs: BP (!) 109/58   Pulse 84   Temp 97.7 F (36.5 C)   Resp 20   Ht 5\' 6"  (1.676 m)   Wt 186 lb 3.2 oz (84.5 kg)   SpO2 97%   BMI 30.05 kg/m   Physical Exam  Constitutional: She is oriented to person, place, and time. She appears well-nourished.  Cardiovascular:  Irregular rate  Pulmonary/Chest: Effort normal. She has wheezes.  Abdominal: Soft. Bowel sounds are normal. There is no tenderness.  Musculoskeletal: Normal range of motion. She exhibits edema.  Neurological: She is alert and oriented to person, place, and time.  Skin: Skin is warm and dry.  Psychiatric: She has a normal mood and affect. Her behavior is normal. Judgment and thought content normal.  Nursing note and vitals reviewed.   Mallampati Score:  MD Evaluation Airway:  WNL Heart: WNL Abdomen: WNL Chest/ Lungs: WNL ASA  Classification: 3 Mallampati/Airway Score: One  Imaging: Mr Lumbar Spine Wo Contrast  Result Date: 09/01/2016 CLINICAL DATA:  Initial evaluation for low back pain for approximately 3 months, it noted injury. Also with intermittent leg numbness. EXAM: MRI LUMBAR SPINE WITHOUT CONTRAST TECHNIQUE: Multiplanar, multisequence MR imaging of the lumbar spine was performed. No intravenous contrast was administered. COMPARISON:  Prior radiograph from 07/11/2016 as well as prior CT from 07/15/2015. FINDINGS: Segmentation: Transitional lumbosacral anatomy with sacralization of the L5 vertebral body. Alignment: Trace anterolisthesis of L3 on L4. Vertebral bodies otherwise normally aligned with preservation of the normal lumbar lordosis. Vertebrae: Vertebral body heights maintained. No evidence for acute or chronic fracture. No worrisome osseous lesions. Signal intensity within the vertebral body bone marrow is somewhat patchy and heterogeneous in appearance, but felt to be within normal limits. The prominent diffusely flowing bulky osteophytic spurring seen throughout the thorax lumbar  spine suggestive of DISH, similar to prior CT. Conus medullaris: Extends to the L1 level and appears normal. Paraspinal and other soft tissues: Paraspinous soft tissues demonstrate no acute abnormality. Scattered T2 hyperintense cyst partially visualize within the right kidney. Visualized fissure structures are otherwise unremarkable. Disc levels: T11-12: Degenerative intervertebral disc space narrowing. Central disc protrusion indents the ventral thecal sac (Series 14, image 3). Associated annular fissure. Superimposed posterior element hypertrophy. There is resultant mild canal stenosis. No significant foraminal encroachment. T12-L1:  Negative. L1-2: Shallow right foraminal disc protrusion closely approximates the exiting right L1 nerve root without neural impingement (series 18, image 8). Superimposed bilateral facet hypertrophy. No significant canal stenosis. Mild right foraminal narrowing. Left neural foramen remains widely patent. L2-3: Diffuse degenerative disc bulge with disc desiccation. Disc bulging slightly eccentric to the left without focal disc herniation. Left E centric disc bulge closely approximates the exiting left L2 nerve root without frank neural impingement (series 18, image 15). Superimposed facet and ligamentum flavum hypertrophy. Resultant mild canal and bilateral lateral recess stenosis. Mild bilateral foraminal narrowing. L3-4: Trace 2 mm anterolisthesis of L3 on L4. Associated diffuse disc bulge with disc desiccation. There is a superimposed shallow right foraminal disc protrusion, closely approximating the exiting right L3 nerve root (series 18, image 22). Additional small central disc protrusion minimally indents the ventral thecal sac (series 18, image 22). Moderate facet arthrosis with mild ligamentum flavum hypertrophy. Resultant mild moderate canal stenosis. Moderate bilateral foraminal narrowing. L4-5: Diffuse degenerative disc bulge with disc desiccation. Superimposed central disc  protrusion indents the ventral thecal sac (series 18, image 28). Superimposed moderate facet arthrosis, slightly worse on the left. Mild canal and bilateral foraminal stenosis. L5-S1: Sacralization of the L5 vertebral body with rudimentary L5-S1 disc. Shallow central/left subarticular disc protrusion and/or posterior osseous ridging mildly encroaches upon the left lateral recess, closely approximating the left S1 nerve root (series 18, image 33). No significant canal stenosis. Foramina remain patent. IMPRESSION: 1. Moderate multilevel degenerative spondylolysis and facet arthropathy, with multiple small disc protrusions as detailed above. Resultant mild to moderate canal stenosis at L2-3 through L4-5 (greatest at L3-4). Please see above report for a full description of these findings. 2. Prominent diffusely flowing bulky osteophytic spurring throughout the visualized thoracolumbar spine, suggestive of DISH. Finding is similar relative to prior CT from 07/15/2015. 3. Transitional lumbosacral anatomy with sacralization of the L5 vertebral body. Careful correlation with numbering system on this exam recommended prior to any potential future planned surgical intervention. Electronically Signed   By: Pincus Badder.D.  On: 09/01/2016 14:54   Mr Card Morphology Wo/w Cm  Result Date: 09/10/2016 CLINICAL DATA:  75 year old female with nonischemic cardiomyopathy of unknown etiology. Echocardiogram suspicious for cardiac amyloidosis. EXAM: CARDIAC MRI TECHNIQUE: The patient was scanned on a 1.5 Tesla GE magnet. A dedicated cardiac coil was used. Functional imaging was done using Fiesta sequences. 2,3, and 4 chamber views were done to assess for RWMA's. Modified Simpson's rule using a short axis stack was used to calculate an ejection fraction on a dedicated work Conservation officer, nature. The patient received 15 cc of Multihance. After 10 minutes inversion recovery sequences were used to assess for  infiltration and scar tissue. CONTRAST:  15 cc  of Multihance FINDINGS: 1. Moderately dilated left ventricle with severe concentric hypertrophy (14-16 mm) and mildly decreased systolic function (LVEF = 42%) with diffuse hypokinesis. Half dose of gadolinium contrast use as GFR borderline, therefore low quality delayed gadolinium images. However, there is diffuse early and late gadolinium uptake in the left ventricular myocardium. LVEDD:  63 mm LVESD:  53 mm LVEDV:  201 ml LVESV:  115 ml SV:  86 ml CO:  8.2 L/min Myocardial mass:  236 g 2. Mildly dilated right ventricle with moderate right ventricular hypertrophy and mildly reduced systolic function (RVEF = 37%). 3.  Moderately dilated left atrium and mildly dilated right atrium. 4.  Mild mitral regurgitation. 5. Normal size of the aortic root and thoracic aorta. Dilated pulmonary artery measuring 32 mm. 6.  Normal pericardium.  No effusion. IMPRESSION: 1. Moderately dilated left ventricle with severe concentric hypertrophy (14-16 mm) and mildly decreased systolic function (LVEF = 42%) with diffuse hypokinesis. There is diffuse early and late gadolinium uptake in the left ventricular myocardium. 2. Mildly dilated right ventricle with moderate right ventricular hypertrophy and mildly reduced systolic function (RVEF = 37%). 3.  Moderately dilated left atrium and mildly dilated right atrium. 4.  Mild mitral regurgitation. 5.  Dilated pulmonary artery measuring 32 mm. Collectively, these findings are highly suspicious for cardiac amyloidosis with diffuse biventricular involvement. Further evaluation with abdominal fat pad biopsy is recommended. Ena Dawley Electronically Signed   By: Ena Dawley   On: 09/10/2016 18:38    Labs:  CBC:  Recent Labs  03/17/16 1818 03/20/16 0507 03/21/16 0525 09/25/16 1202  WBC 4.4 4.5 4.1 6.8  HGB 11.0* 10.3* 11.1* 10.4*  HCT 32.7* 31.0* 33.1* 32.3*  PLT 170 159 178 233    COAGS: No results for input(s): INR, APTT  in the last 8760 hours.  BMP:  Recent Labs  03/22/16 0504 03/23/16 0929 07/20/16 1105 07/27/16 1029 08/11/16 1022 08/25/16 1151 09/08/16 0750  NA 143 137  --  138 141 139  --   K 4.0 4.4  --  5.0 4.9 4.3  --   CL 108 108  --  102 109 103  --   CO2 27 22  --  21 25 26   --   GLUCOSE 113* 165*  --  95 84 76  --   BUN 46* 33*  --  38* 35* 42*  --   CALCIUM 9.7 9.7  --  10.0 9.3 10.0  --   CREATININE 1.57* 1.37* 2.69* 1.84* 1.72* 1.67* 1.81*  GFRNONAA 31* 37* 16*  --   --   --  26*  GFRAA 36* 43* 19*  --   --   --  30*    LIVER FUNCTION TESTS:  Recent Labs  03/17/16 1818  BILITOT 0.6  AST 21  ALT 20  ALKPHOS 76  PROT 7.2  ALBUMIN 4.0    TUMOR MARKERS: No results for input(s): AFPTM, CEA, CA199, CHROMGRNA in the last 8760 hours.  Assessment and Plan:  CHF Nonischemic cardiomyopathy Cardiac MRI worrisome for amyloidosis Now scheduled for fat pad biopsy Risks and Benefits discussed with the patient including, but not limited to bleeding, infection, damage to adjacent structures or low yield requiring additional tests. All of the patient's questions were answered, patient is agreeable to proceed. Consent signed and in chart.   Thank you for this interesting consult.  I greatly enjoyed meeting Mandy Rhodes and look forward to participating in their care.  A copy of this report was sent to the requesting provider on this date.  Electronically Signed: Rilyn Scroggs A 09/25/2016, 12:35 PM   I spent a total of  30 Minutes   in face to face in clinical consultation, greater than 50% of which was counseling/coordinating care for fat pad bx

## 2016-09-25 NOTE — Procedures (Signed)
Interventional Radiology Procedure Note  Procedure: US guided biopsy of anterior abd wall fat pad, for possible amyloidosis.  Complications: None Recommendations:  - Ok to shower tomorrow - Do not submerge for 7 days - Routine care  - follow up pathology  Signed,  Dulcy Fanny. Earleen Newport, DO

## 2016-09-27 ENCOUNTER — Telehealth: Payer: Self-pay | Admitting: Internal Medicine

## 2016-09-27 NOTE — Telephone Encounter (Signed)
Pt returned call to Dr. Lysbeth Penner nurse-pls call [phone]

## 2016-09-27 NOTE — Telephone Encounter (Signed)
Results reviewed and discussed w patient. No further action needed at this time.

## 2016-09-28 ENCOUNTER — Telehealth: Payer: Self-pay | Admitting: Internal Medicine

## 2016-09-28 DIAGNOSIS — Z79899 Other long term (current) drug therapy: Secondary | ICD-10-CM

## 2016-09-28 NOTE — Telephone Encounter (Signed)
Tried to call patient to clarify her medications. No answer. Left message to call back.

## 2016-09-28 NOTE — Telephone Encounter (Signed)
LMTCB for Mandy Rhodes, [place]

## 2016-09-28 NOTE — Telephone Encounter (Signed)
New message  Harmon Pier from [facility] call to speak with RN about some questions she had about pt care. Please call back to discuss

## 2016-09-28 NOTE — Telephone Encounter (Signed)
Received incoming call from Heart Failure nurse. She stated the patient has had 32 lb weight loss over the past 32 days. So 1 pound per day. Patient was taking furosemide 40 mg bid along with potassium 20 mEq bid. Patient is now taking furosemide 40 mg twice a week and still taking potassium 20 mEq twice a day. Cannot find where furosemide was changed to twice a week. From office visit with Suanne Marker on 08/28/16, patient was to take lasix 40mg  bid for once week then day lasix once a day along with potassium.  Heart failure nurse is concerned because patient is still taking same amount of potassium and wants clarification on Potassium and lasix for patient.   Discussed with Dr Ellyn Hack, DOD, patient will need BMP.

## 2016-09-29 NOTE — Telephone Encounter (Signed)
Pt states that she is taking lasix 40mg  daily and potassium 20MeQ daily. She will be in on Monday to have labs done and will wait for call back with results and to see if there any changes in her medication.

## 2016-09-29 NOTE — Telephone Encounter (Signed)
No answer. Left message to call back.   

## 2016-09-29 NOTE — Telephone Encounter (Signed)
Mandy Rhodes is returning a call . Please call at [phone]

## 2016-10-02 ENCOUNTER — Other Ambulatory Visit: Payer: Self-pay | Admitting: Pharmacist

## 2016-10-02 NOTE — Patient Outreach (Signed)
Mandy Rhodes is a 75 y.o. female referred to pharmacy for medication assistance related to affording her Entresto. Called and spoke with patient. HIPAA identifiers verified and verbal consent received.  Reports that her provider has switched her from Entresto to captopril for now for affordability. Reports that she spoke with a nurse from [employer] a couple of weeks ago who helped her to complete the Extra Help application over the phone. Reports that she has not yet received the response in the mail from [employer]. Reports that she has not yet gotten in touch with the Patient Access Network (PAN) Foundation. Patient reports that she no longer has this number. Provide patient with the phone number again and she reports that she will call now.  Mandy Rhodes calls me back to let me know that she had to leave a message with the [facility] due to high call volume, but that she will await their call back.  PLAN:  Will call to follow up with Mandy Rhodes on 10/04/16.  Harlow Asa, PharmD Clinical Pharmacist [facility] [phone]

## 2016-10-03 ENCOUNTER — Telehealth: Payer: Self-pay | Admitting: Internal Medicine

## 2016-10-03 ENCOUNTER — Other Ambulatory Visit: Payer: Self-pay

## 2016-10-03 ENCOUNTER — Other Ambulatory Visit: Payer: Self-pay | Admitting: *Deleted

## 2016-10-03 ENCOUNTER — Other Ambulatory Visit: Payer: Medicare Other | Admitting: *Deleted

## 2016-10-03 DIAGNOSIS — I10 Essential (primary) hypertension: Secondary | ICD-10-CM

## 2016-10-03 DIAGNOSIS — Z79899 Other long term (current) drug therapy: Secondary | ICD-10-CM

## 2016-10-03 LAB — BASIC METABOLIC PANEL
BUN: 33 mg/dL — ABNORMAL HIGH (ref 7–25)
CO2: 24 mmol/L (ref 20–31)
Calcium: 10 mg/dL (ref 8.6–10.4)
Chloride: 102 mmol/L (ref 98–110)
Creat: 1.55 mg/dL — ABNORMAL HIGH (ref 0.60–0.93)
Glucose, Bld: 111 mg/dL — ABNORMAL HIGH (ref 65–99)
POTASSIUM: 3.9 mmol/L (ref 3.5–5.3)
SODIUM: 141 mmol/L (ref 135–146)

## 2016-10-03 NOTE — Telephone Encounter (Signed)
Left message for rep at [facility] to call if she has any further needs.

## 2016-10-03 NOTE — Telephone Encounter (Signed)
New message      [facility] is at [facility] to have labs drawn. However, they do not have an order.  Please call

## 2016-10-03 NOTE — Telephone Encounter (Signed)
Received a call from patient.Stated she is presently at [facility] and no order in their system.Bmet order entered for [facility].

## 2016-10-03 NOTE — Telephone Encounter (Signed)
Pt is calling to get an order for her lab work she is at [facility]

## 2016-10-03 NOTE — Addendum Note (Signed)
Addended by: Eulis Foster on: 10/03/2016 03:49 PM   Modules accepted: Orders

## 2016-10-03 NOTE — Telephone Encounter (Signed)
I called and was unable to reach [facility] by phone. I left pt a detailed msg (OK per DPR) explaining that we had put in initial orders for [facility] lab but that I would submit order for [facility] - any questions to call our office.

## 2016-10-04 ENCOUNTER — Encounter: Payer: Self-pay | Admitting: *Deleted

## 2016-10-04 ENCOUNTER — Other Ambulatory Visit: Payer: Self-pay | Admitting: *Deleted

## 2016-10-04 ENCOUNTER — Other Ambulatory Visit: Payer: Self-pay | Admitting: Pharmacist

## 2016-10-04 ENCOUNTER — Ambulatory Visit: Payer: Self-pay | Admitting: Pharmacist

## 2016-10-04 NOTE — Patient Outreach (Signed)
Called to follow up with Ms. Becklund regarding her call to the Patient Psychologist, clinical (PAN) Foundation. Left a HIPAA compliant message on the patient's voicemail. If have not heard from patient by 10/06/16, will give her another call at that time.  Harlow Asa, PharmD Clinical Pharmacist [facility] [phone]

## 2016-10-04 NOTE — Patient Outreach (Addendum)
[facility]) Care Management  10/04/2016   Mandy Rhodes 1941-02-27 [id number]  Subjective: RN Health Coach telephone call to patient.  Hipaa compliance verified.  Per patient her weight today is 181.6 pounds . Patient reported that her last weight on 09/06/2016 was 206.6.   Patient stated that she is having difficulty swallowing and she is having throat pain. Per patient she is coughing up phlegm and vomits from coughing. Per patient she is unable to eat any thing.  Per patient she has not checked her blood sugar. Patient stated she didn't feel like her blood sugar was low. But RN noted patient stated she feels so tired. Patient stated she has a lot of congestion in her chest. Per patient she was afraid she was getting pneumonia. Patient stated she was going to call the Dr but felt so bad.     Objective:   Current Medications:  Current Outpatient Prescriptions  Medication Sig Dispense Refill  . aspirin EC 81 MG EC tablet Take 1 tablet (81 mg total) by mouth daily. 30 tablet 0  . Calcium Carbonate-Vit D-Min (CALCIUM 1200 PO) Take 600 mg by mouth daily.     . captopril (CAPOTEN) 12.5 MG tablet Take 1 tablet (12.5 mg total) by mouth 2 (two) times daily. 180 tablet 3  . cholecalciferol (VITAMIN D) 1000 UNITS tablet Take 1,000 Units by mouth daily. Reported on 03/27/2016    . docusate sodium (COLACE) 100 MG capsule Take 100 mg by mouth daily as needed for mild constipation.    . ferrous sulfate 325 (65 FE) MG EC tablet Take 325 mg by mouth daily.     . furosemide (LASIX) 40 MG tablet Take 1 tablet (40 mg total) by mouth daily. OK TO TAKE ONE ADDITIONAL TABLET AS NEEDED FOR WEIGHT GAIN (Patient taking differently: Take 40 mg by mouth every other day. OK TO TAKE ONE ADDITIONAL TABLET AS NEEDED FOR WEIGHT GAIN) 100 tablet 3  . glimepiride (AMARYL) 1 MG tablet Take 1 mg by mouth daily with breakfast.     . Multiple Vitamin (MULTIVITAMIN) tablet Take 1 tablet by mouth daily.      .  nortriptyline (PAMELOR) 10 MG capsule Take 10 mg by mouth at bedtime.    Marland Kitchen omeprazole (PRILOSEC) 40 MG capsule Take 40 mg by mouth daily.      . potassium chloride SA (K-DUR,KLOR-CON) 20 MEQ tablet Take 1 tablet (20 mEq total) by mouth daily. May take an extra tablet if extra furosemide is taken (Patient taking differently: Take 20 mEq by mouth every other day. May take an extra tablet if extra furosemide is taken) 30 tablet 3  . pravastatin (PRAVACHOL) 40 MG tablet Take 40 mg by mouth daily.      Marland Kitchen PRESCRIPTION MEDICATION 1 drop every 3 (three) months. Eye drops to be used the day before, of , and after eye injections     No current facility-administered medications for this visit.     Functional Status:  In your present state of health, do you have any difficulty performing the following activities: 10/04/2016 09/25/2016  Hearing? N N  Vision? N N  Difficulty concentrating or making decisions? N N  Walking or climbing stairs? Y Y  Dressing or bathing? N N  Doing errands, shopping? Y -  Conservation officer, nature and eating ? - -  Using the Toilet? - -  In the past six months, have you accidently leaked urine? - -  Do you have problems with loss  of bowel control? - -  Managing your Medications? - -  Managing your Finances? - -  Housekeeping or managing your Housekeeping? - -  Some recent data might be hidden    Fall/Depression Screening: PHQ 2/9 Scores 10/04/2016 09/06/2016 08/08/2016 06/30/2016 06/01/2016 03/27/2016  PHQ - 2 Score 0 0 0 0 0 0    Assessment:  Per Patient is having difficulty swallowing  Per Patient stated she is coughing and vomiting Patient wt is 181.8 Per Patient her weight was 206.6 on 09/06/2016  RN noted  patient weight was 186 on 09/25/2016 at Dr office   Plan:  RN called Dr and made appointment for today RN notified patient of appointment time so she can arrange transportation RN informed  patient to tell the Dr all the symptoms she had stated above. RN sent  educational material on colds, flu and pneumonia RN will follow up with patient in December for outreach  [facility] [phone]

## 2016-10-06 ENCOUNTER — Other Ambulatory Visit: Payer: Self-pay | Admitting: Pharmacist

## 2016-10-06 NOTE — Patient Outreach (Signed)
Called to follow up with Mandy Rhodes regarding her call to the Patient Psychologist, clinical (PAN) [employer]. Called and spoke with patient. HIPAA identifiers verified and verbal consent received.  Ms. Cohan reports that she has been feeling sick with congestion and coughing. Reports that she went to her doctor yesterday and was prescribed a Zpak. Reports that she is feeling better today. Counsel patient on the importance of adherence.   Patient reports that she did not receive a call back from the Patient [employer] (PAN) [employer]. Reports that she still has the foundation's phone number and that she will call back again today. Patient reports that she has no further medication questions/concerns for me at this time. Confirm that patient has my phone number.  Will close pharmacy episode at this time.   Harlow Asa, PharmD Clinical Pharmacist [facility] [phone]

## 2016-10-06 NOTE — Patient Outreach (Signed)
Called again to follow up with Mandy Rhodes regarding her call to the Patient Psychologist, clinical (PAN) Foundation. Left a HIPAA compliant message on the patient's voicemail. If have not heard from patient by 10/09/16, will give her another call at that time.  Harlow Asa, PharmD Clinical Pharmacist [facility] [phone]

## 2016-10-12 ENCOUNTER — Encounter (HOSPITAL_COMMUNITY): Payer: Self-pay | Admitting: Emergency Medicine

## 2016-10-12 ENCOUNTER — Inpatient Hospital Stay (HOSPITAL_COMMUNITY)
Admission: EM | Admit: 2016-10-12 | Discharge: 2016-10-27 | DRG: 515 | Disposition: A | Discharge status: Skilled Nursing Facility | Payer: Medicare Other | Attending: Family Medicine | Admitting: Family Medicine

## 2016-10-12 ENCOUNTER — Emergency Department (HOSPITAL_COMMUNITY): Payer: Medicare Other

## 2016-10-12 DIAGNOSIS — E872 Acidosis: Secondary | ICD-10-CM | POA: Diagnosis present

## 2016-10-12 DIAGNOSIS — I248 Other forms of acute ischemic heart disease: Secondary | ICD-10-CM

## 2016-10-12 DIAGNOSIS — I25119 Atherosclerotic heart disease of native coronary artery with unspecified angina pectoris: Secondary | ICD-10-CM | POA: Diagnosis present

## 2016-10-12 DIAGNOSIS — R6520 Severe sepsis without septic shock: Secondary | ICD-10-CM | POA: Diagnosis present

## 2016-10-12 DIAGNOSIS — E1140 Type 2 diabetes mellitus with diabetic neuropathy, unspecified: Secondary | ICD-10-CM | POA: Diagnosis present

## 2016-10-12 DIAGNOSIS — E859 Amyloidosis, unspecified: Secondary | ICD-10-CM | POA: Diagnosis present

## 2016-10-12 DIAGNOSIS — J387 Other diseases of larynx: Secondary | ICD-10-CM

## 2016-10-12 DIAGNOSIS — J9601 Acute respiratory failure with hypoxia: Secondary | ICD-10-CM | POA: Diagnosis present

## 2016-10-12 DIAGNOSIS — E870 Hyperosmolality and hypernatremia: Secondary | ICD-10-CM

## 2016-10-12 DIAGNOSIS — I214 Non-ST elevation (NSTEMI) myocardial infarction: Secondary | ICD-10-CM | POA: Diagnosis present

## 2016-10-12 DIAGNOSIS — J449 Chronic obstructive pulmonary disease, unspecified: Secondary | ICD-10-CM | POA: Diagnosis present

## 2016-10-12 DIAGNOSIS — N289 Disorder of kidney and ureter, unspecified: Secondary | ICD-10-CM

## 2016-10-12 DIAGNOSIS — N179 Acute kidney failure, unspecified: Secondary | ICD-10-CM | POA: Diagnosis present

## 2016-10-12 DIAGNOSIS — T8463XA Infection and inflammatory reaction due to internal fixation device of spine, initial encounter: Principal | ICD-10-CM | POA: Diagnosis present

## 2016-10-12 DIAGNOSIS — I429 Cardiomyopathy, unspecified: Secondary | ICD-10-CM | POA: Diagnosis present

## 2016-10-12 DIAGNOSIS — F329 Major depressive disorder, single episode, unspecified: Secondary | ICD-10-CM | POA: Diagnosis present

## 2016-10-12 DIAGNOSIS — A419 Sepsis, unspecified organism: Secondary | ICD-10-CM

## 2016-10-12 DIAGNOSIS — E1122 Type 2 diabetes mellitus with diabetic chronic kidney disease: Secondary | ICD-10-CM | POA: Diagnosis present

## 2016-10-12 DIAGNOSIS — K219 Gastro-esophageal reflux disease without esophagitis: Secondary | ICD-10-CM | POA: Diagnosis present

## 2016-10-12 DIAGNOSIS — Z66 Do not resuscitate: Secondary | ICD-10-CM | POA: Diagnosis present

## 2016-10-12 DIAGNOSIS — R229 Localized swelling, mass and lump, unspecified: Secondary | ICD-10-CM

## 2016-10-12 DIAGNOSIS — Z7984 Long term (current) use of oral hypoglycemic drugs: Secondary | ICD-10-CM

## 2016-10-12 DIAGNOSIS — J9690 Respiratory failure, unspecified, unspecified whether with hypoxia or hypercapnia: Secondary | ICD-10-CM

## 2016-10-12 DIAGNOSIS — Z6832 Body mass index (BMI) 32.0-32.9, adult: Secondary | ICD-10-CM

## 2016-10-12 DIAGNOSIS — Z0189 Encounter for other specified special examinations: Secondary | ICD-10-CM

## 2016-10-12 DIAGNOSIS — E440 Moderate protein-calorie malnutrition: Secondary | ICD-10-CM | POA: Diagnosis present

## 2016-10-12 DIAGNOSIS — R7989 Other specified abnormal findings of blood chemistry: Secondary | ICD-10-CM

## 2016-10-12 DIAGNOSIS — L8990 Pressure ulcer of unspecified site, unspecified stage: Secondary | ICD-10-CM | POA: Insufficient documentation

## 2016-10-12 DIAGNOSIS — I10 Essential (primary) hypertension: Secondary | ICD-10-CM | POA: Diagnosis present

## 2016-10-12 DIAGNOSIS — R1319 Other dysphagia: Secondary | ICD-10-CM | POA: Diagnosis present

## 2016-10-12 DIAGNOSIS — Z87891 Personal history of nicotine dependence: Secondary | ICD-10-CM

## 2016-10-12 DIAGNOSIS — I5043 Acute on chronic combined systolic (congestive) and diastolic (congestive) heart failure: Secondary | ICD-10-CM | POA: Diagnosis present

## 2016-10-12 DIAGNOSIS — R0602 Shortness of breath: Secondary | ICD-10-CM | POA: Diagnosis not present

## 2016-10-12 DIAGNOSIS — M6281 Muscle weakness (generalized): Secondary | ICD-10-CM

## 2016-10-12 DIAGNOSIS — IMO0002 Reserved for concepts with insufficient information to code with codable children: Secondary | ICD-10-CM

## 2016-10-12 DIAGNOSIS — E878 Other disorders of electrolyte and fluid balance, not elsewhere classified: Secondary | ICD-10-CM | POA: Diagnosis not present

## 2016-10-12 DIAGNOSIS — J690 Pneumonitis due to inhalation of food and vomit: Secondary | ICD-10-CM | POA: Diagnosis present

## 2016-10-12 DIAGNOSIS — D649 Anemia, unspecified: Secondary | ICD-10-CM

## 2016-10-12 DIAGNOSIS — E1151 Type 2 diabetes mellitus with diabetic peripheral angiopathy without gangrene: Secondary | ICD-10-CM | POA: Diagnosis present

## 2016-10-12 DIAGNOSIS — J189 Pneumonia, unspecified organism: Secondary | ICD-10-CM

## 2016-10-12 DIAGNOSIS — D509 Iron deficiency anemia, unspecified: Secondary | ICD-10-CM | POA: Diagnosis present

## 2016-10-12 DIAGNOSIS — N183 Chronic kidney disease, stage 3 unspecified: Secondary | ICD-10-CM | POA: Diagnosis present

## 2016-10-12 DIAGNOSIS — K223 Perforation of esophagus: Secondary | ICD-10-CM

## 2016-10-12 DIAGNOSIS — E11649 Type 2 diabetes mellitus with hypoglycemia without coma: Secondary | ICD-10-CM | POA: Diagnosis present

## 2016-10-12 DIAGNOSIS — I428 Other cardiomyopathies: Secondary | ICD-10-CM

## 2016-10-12 DIAGNOSIS — I959 Hypotension, unspecified: Secondary | ICD-10-CM | POA: Diagnosis not present

## 2016-10-12 DIAGNOSIS — M4620 Osteomyelitis of vertebra, site unspecified: Secondary | ICD-10-CM | POA: Diagnosis present

## 2016-10-12 DIAGNOSIS — E876 Hypokalemia: Secondary | ICD-10-CM

## 2016-10-12 DIAGNOSIS — J9602 Acute respiratory failure with hypercapnia: Secondary | ICD-10-CM

## 2016-10-12 DIAGNOSIS — Y838 Other surgical procedures as the cause of abnormal reaction of the patient, or of later complication, without mention of misadventure at the time of the procedure: Secondary | ICD-10-CM | POA: Diagnosis present

## 2016-10-12 DIAGNOSIS — Z79899 Other long term (current) drug therapy: Secondary | ICD-10-CM

## 2016-10-12 DIAGNOSIS — L89152 Pressure ulcer of sacral region, stage 2: Secondary | ICD-10-CM | POA: Diagnosis present

## 2016-10-12 DIAGNOSIS — Z4659 Encounter for fitting and adjustment of other gastrointestinal appliance and device: Secondary | ICD-10-CM

## 2016-10-12 DIAGNOSIS — Z7982 Long term (current) use of aspirin: Secondary | ICD-10-CM

## 2016-10-12 DIAGNOSIS — G9341 Metabolic encephalopathy: Secondary | ICD-10-CM | POA: Diagnosis present

## 2016-10-12 DIAGNOSIS — E1169 Type 2 diabetes mellitus with other specified complication: Secondary | ICD-10-CM | POA: Diagnosis present

## 2016-10-12 DIAGNOSIS — J390 Retropharyngeal and parapharyngeal abscess: Secondary | ICD-10-CM | POA: Diagnosis present

## 2016-10-12 DIAGNOSIS — I493 Ventricular premature depolarization: Secondary | ICD-10-CM | POA: Diagnosis not present

## 2016-10-12 DIAGNOSIS — I471 Supraventricular tachycardia: Secondary | ICD-10-CM | POA: Insufficient documentation

## 2016-10-12 DIAGNOSIS — N189 Chronic kidney disease, unspecified: Secondary | ICD-10-CM

## 2016-10-12 DIAGNOSIS — I130 Hypertensive heart and chronic kidney disease with heart failure and stage 1 through stage 4 chronic kidney disease, or unspecified chronic kidney disease: Secondary | ICD-10-CM | POA: Diagnosis present

## 2016-10-12 DIAGNOSIS — G9340 Encephalopathy, unspecified: Secondary | ICD-10-CM

## 2016-10-12 DIAGNOSIS — E785 Hyperlipidemia, unspecified: Secondary | ICD-10-CM | POA: Diagnosis present

## 2016-10-12 DIAGNOSIS — I5042 Chronic combined systolic (congestive) and diastolic (congestive) heart failure: Secondary | ICD-10-CM | POA: Diagnosis present

## 2016-10-12 DIAGNOSIS — R778 Other specified abnormalities of plasma proteins: Secondary | ICD-10-CM

## 2016-10-12 LAB — CBC WITH DIFFERENTIAL/PLATELET
BASOS ABS: 0 10*3/uL (ref 0.0–0.1)
Basophils Relative: 0 %
EOS ABS: 0 10*3/uL (ref 0.0–0.7)
Eosinophils Relative: 0 %
HCT: 38.3 % (ref 36.0–46.0)
HEMOGLOBIN: 12.3 g/dL (ref 12.0–15.0)
Lymphocytes Relative: 8 %
Lymphs Abs: 1 10*3/uL (ref 0.7–4.0)
MCH: 28.7 pg (ref 26.0–34.0)
MCHC: 32.1 g/dL (ref 30.0–36.0)
MCV: 89.3 fL (ref 78.0–100.0)
MONOS PCT: 6 %
Monocytes Absolute: 0.7 10*3/uL (ref 0.1–1.0)
NEUTROS PCT: 86 %
Neutro Abs: 10.4 10*3/uL — ABNORMAL HIGH (ref 1.7–7.7)
Platelets: 179 10*3/uL (ref 150–400)
RBC: 4.29 MIL/uL (ref 3.87–5.11)
RDW: 16.3 % — AB (ref 11.5–15.5)
WBC: 12 10*3/uL — ABNORMAL HIGH (ref 4.0–10.5)

## 2016-10-12 LAB — COMPREHENSIVE METABOLIC PANEL
ALBUMIN: 3.3 g/dL — AB (ref 3.5–5.0)
ALK PHOS: 97 U/L (ref 38–126)
ALT: 30 U/L (ref 14–54)
AST: 33 U/L (ref 15–41)
Anion gap: 10 (ref 5–15)
BILIRUBIN TOTAL: 1 mg/dL (ref 0.3–1.2)
BUN: 60 mg/dL — AB (ref 6–20)
CALCIUM: 10.5 mg/dL — AB (ref 8.9–10.3)
CO2: 26 mmol/L (ref 22–32)
Chloride: 109 mmol/L (ref 101–111)
Creatinine, Ser: 1.84 mg/dL — ABNORMAL HIGH (ref 0.44–1.00)
GFR calc Af Amer: 30 mL/min — ABNORMAL LOW (ref >60)
GFR calc non Af Amer: 26 mL/min — ABNORMAL LOW (ref >60)
GLUCOSE: 137 mg/dL — AB (ref 65–99)
POTASSIUM: 3.7 mmol/L (ref 3.5–5.1)
Sodium: 145 mmol/L (ref 135–145)
TOTAL PROTEIN: 8 g/dL (ref 6.5–8.1)

## 2016-10-12 LAB — BLOOD GAS, ARTERIAL
Acid-Base Excess: 2 mmol/L (ref 0.0–2.0)
Bicarbonate: 31.2 mmol/L — ABNORMAL HIGH (ref 20.0–28.0)
Drawn by: 422461
O2 Content: 4 L/min
O2 Saturation: 98.6 %
PCO2 ART: 78 mmHg — AB (ref 32.0–48.0)
PH ART: 7.225 — AB (ref 7.350–7.450)
Patient temperature: 98.1
pO2, Arterial: 230 mmHg — ABNORMAL HIGH (ref 83.0–108.0)

## 2016-10-12 LAB — BRAIN NATRIURETIC PEPTIDE: B Natriuretic Peptide: 313.4 pg/mL — ABNORMAL HIGH (ref 0.0–100.0)

## 2016-10-12 LAB — ETHANOL: Alcohol, Ethyl (B): <5 mg/dL (ref <5)

## 2016-10-12 LAB — CBG MONITORING, ED: GLUCOSE-CAPILLARY: 120 mg/dL — AB (ref 65–99)

## 2016-10-12 NOTE — ED Triage Notes (Signed)
Brought in by EMS from home with c/o shortness of breath.  Hx of CHF.  Pt also reports generalized weakness since 2 weeks ago.  Has recently completed a course of antibiotics which her PCP prescribed when pt visited her PCP for same complaint of shortness of breath.  Pt arrived to ED on O2 at 4L/min by EMS; pt appears lethargic but easily aroused and responds appropriately.

## 2016-10-12 NOTE — ED Notes (Signed)
Bed: WA03 Expected date:  Expected time:  Means of arrival:  Comments: EMS SOB/female

## 2016-10-12 NOTE — ED Provider Notes (Signed)
[facility] DEPT Provider Note   CSN: [id number] Arrival date & time: 10/12/16  2220  By signing my name below, I, Emmanuella Mensah, attest that this documentation has been prepared under the direction and in the presence of Delora Fuel, MD. Electronically Signed: Judithann Sauger, ED Scribe. 10/12/16. 11:18 PM.   History   Chief Complaint Chief Complaint  Patient presents with  . Shortness of Breath    HPI Comments: Level 5 Caveat due to Altered Mental Status  Mandy Rhodes is a 75 y.o. female with a hx of DM, CHF, chronic kidney disease, and hypertension brought in by ambulance, who presents to the Emergency Department for evaluation of shortness of breath. Per EMS, pt was complaining of generalized weakness 2 weeks ago. Pt recently finished a course of antibiotics after c/o shortness of breath. She arrived tonight on 4L O2. Pt has an allergy to Sulfa Antibiotics. No known hx of COPD or asthma.   The history is provided by the EMS personnel. The history is limited by the condition of the patient. No language interpreter was used.    Past Medical History:  Diagnosis Date  . Anemia   . Arthritis   . CHF (congestive heart failure) ([place])   . Chronic kidney disease    Renal Insuffiency, see [facility] once a year  . Diabetes mellitus    type 2  . GERD (gastroesophageal reflux disease)   . History of rotator cuff tear    Torn right rotator cuff.  . History of spinal stenosis   . Hypertension   . Neuropathy associated with endocrine disorder [facility])     Patient Active Problem List   Diagnosis Date Noted  . Edema 08/23/2016  . Shortness of breath 07/14/2016  . Medication management 07/14/2016  . Non-ischemic cardiomyopathy ([place]) 07/14/2016  . Hyperlipidemia 07/14/2016  . Chronic combined systolic and diastolic heart failure ([place]) 04/13/2016  . Type 2 diabetes mellitus with stage 3 chronic kidney disease, without long-term current use of insulin ([place])   . CHF  (congestive heart failure) ([place]) 03/20/2016  . CKD (chronic kidney disease)   . Acute systolic congestive heart failure, NYHA class 3 ([place]) 03/19/2016  . Cough   . Cardiomegaly 03/17/2016  . CKD (chronic kidney disease) stage 2, GFR 60-89 ml/min 03/17/2016  . Elevated troponin 03/17/2016  . Essential hypertension 03/17/2016  . DM type 2 causing CKD stage 2 ([place]) 03/17/2016  . Elevated troponin I level 03/17/2016  . Deficiency anemia 09/29/2011  . Diabetes mellitus ([place]) 09/29/2011  . Renal insufficiency 09/29/2011    Past Surgical History:  Procedure Laterality Date  . ABDOMINAL HYSTERECTOMY  1971   Partial  . APPENDECTOMY    . BACK SURGERY  2009   Disectomy  . CARDIAC CATHETERIZATION N/A 03/22/2016   Procedure: Left Heart Cath and Coronary Angiography;  Surgeon: Peter M Martinique, MD;  Location: [facility];  Service: Cardiovascular;  Laterality: N/A;  . CHOLECYSTECTOMY  1969   Status post   . CYST REMOVAL TRUNK Right 12/03/2014   Procedure: EXCISION OF RIGHT BACK CYST ;  Surgeon: Coralie Keens, MD;  Location: [facility];  Service: General;  Laterality: Right;  . FOOT SURGERY Bilateral    bone removed  . NEUROPLASTY / TRANSPOSITION MEDIAN NERVE AT CARPAL TUNNEL Bilateral    Hx  Left carpal tunnel repair    OB History    No data available       Home Medications    Prior to Admission medications  Medication Sig Start Date End Date Taking? Authorizing Provider  aspirin EC 81 MG EC tablet Take 1 tablet (81 mg total) by mouth daily. 03/23/16   Shanker Kristeen Mans, MD  azithromycin (ZITHROMAX) 250 MG tablet Take by mouth daily.    Historical Provider, MD  Calcium Carbonate-Vit D-Min (CALCIUM 1200 PO) Take 600 mg by mouth daily.     Historical Provider, MD  captopril (CAPOTEN) 12.5 MG tablet Take 1 tablet (12.5 mg total) by mouth 2 (two) times daily. 08/31/16   Pixie Casino, MD  cholecalciferol (VITAMIN D) 1000 UNITS tablet Take 1,000 Units by mouth daily. Reported on  03/27/2016    Historical Provider, MD  docusate sodium (COLACE) 100 MG capsule Take 100 mg by mouth daily as needed for mild constipation.    Historical Provider, MD  ferrous sulfate 325 (65 FE) MG EC tablet Take 325 mg by mouth daily.     Historical Provider, MD  furosemide (LASIX) 40 MG tablet Take 1 tablet (40 mg total) by mouth daily. OK TO TAKE ONE ADDITIONAL TABLET AS NEEDED FOR WEIGHT GAIN Patient taking differently: Take 40 mg by mouth every other day. OK TO TAKE ONE ADDITIONAL TABLET AS NEEDED FOR WEIGHT GAIN 09/04/16   Evelene Croon Barrett, PA-C  glimepiride (AMARYL) 1 MG tablet Take 1 mg by mouth daily with breakfast.  02/15/16   Historical Provider, MD  Multiple Vitamin (MULTIVITAMIN) tablet Take 1 tablet by mouth daily.      Historical Provider, MD  nortriptyline (PAMELOR) 10 MG capsule Take 10 mg by mouth at bedtime.    Historical Provider, MD  omeprazole (PRILOSEC) 40 MG capsule Take 40 mg by mouth daily.      Historical Provider, MD  potassium chloride SA (K-DUR,KLOR-CON) 20 MEQ tablet Take 1 tablet (20 mEq total) by mouth daily. May take an extra tablet if extra furosemide is taken Patient taking differently: Take 20 mEq by mouth every other day. May take an extra tablet if extra furosemide is taken 09/04/16   Pixie Casino, MD  pravastatin (PRAVACHOL) 40 MG tablet Take 40 mg by mouth daily.      Historical Provider, MD  PRESCRIPTION MEDICATION 1 drop every 3 (three) months. Eye drops to be used the day before, of , and after eye injections    Historical Provider, MD    Family History Family History  Problem Relation Age of Onset  . Diabetes type II Mother   . Hypertension Father   . Diabetes type II Sister   . Ovarian cancer Sister   . Diabetes type II Other   . Stroke Neg Hx   . CAD Neg Hx   . Heart failure Neg Hx     Social History Social History  Substance Use Topics  . Smoking status: Former Research scientist (life sciences)  . Smokeless tobacco: Never Used     Comment: 12/02/13- quit over 20  years ago  . Alcohol use No     Allergies   Sulfa antibiotics   Review of Systems Review of Systems  Unable to perform ROS: Mental status change     Physical Exam Updated Vital Signs BP 121/73   Pulse 74   Temp 98.1 F (36.7 C) (Oral)   Resp 20   Ht 5\' 6"  (1.676 m)   Wt 186 lb (84.4 kg)   SpO2 100%   BMI 30.02 kg/m   Physical Exam  Constitutional: She appears well-developed and well-nourished.  Somnolent with periods of apnea  Arousal to voice  and deep painful stimuli   HENT:  Head: Normocephalic and atraumatic.  Eyes: EOM are normal. Pupils are equal, round, and reactive to light.  Neck: Normal range of motion. Neck supple. No JVD present.  Cardiovascular: Normal rate, regular rhythm and normal heart sounds.   No murmur heard. Frequent premature beats; JVD present   Pulmonary/Chest: Effort normal. She has decreased breath sounds. She has no wheezes. She has no rales. She exhibits no tenderness.  Coarse breaths throughout; decreased breath sounds to right base   Abdominal: Soft. Bowel sounds are normal. She exhibits no distension and no mass. There is no tenderness.  Musculoskeletal: Normal range of motion. She exhibits edema.  3+ pretibial edema  Lymphadenopathy:    She has no cervical adenopathy.  Neurological: She is alert. No cranial nerve deficit. Coordination normal.  Mental status as noted above Pt minimally verbal; does not follow commands; increased muscle tone but no focal weakness   Skin: Skin is warm and dry. No rash noted.  Psychiatric: She has a normal mood and affect. Her behavior is normal. Judgment and thought content normal.  Nursing note and vitals reviewed.    ED Treatments / Results  DIAGNOSTIC STUDIES: Oxygen Saturation is 100% on RA, normal by my interpretation.    COORDINATION OF CARE: 11:03 PM- Pt advised of plan for treatment and pt agrees. Pt will receive lab work, chest x-ray, and EKG for further evaluation.    Labs (all labs  ordered are listed, but only abnormal results are displayed) Labs Reviewed - No data to display  EKG Undetermined rhythm because of severe artifact, with frequent PVCs, rate of 58 Left axis deviation Nonspecific intraventricular conduction delay Nonspecific ST and T changes Compared with ECG of 07/14/2016, undetermined rhythm, PVCs, PACs are new, nonspecific ST and T changes are new  Radiology Dg Abd 1 View  Result Date: 10/13/2016 CLINICAL DATA:  Enteric tube placement. EXAM: ABDOMEN - 1 VIEW COMPARISON:  None. FINDINGS: Normal bowel gas pattern. Enteric tube tip projects over the mid stomach. Multilevel degenerative changes of the lumbar spine. Moderate bilateral hip osteoarthrosis. IMPRESSION: Enteric tube tip projects over mid stomach. Electronically Signed   By: Kristine Garbe M.D.   On: 10/13/2016 05:30   Ct Soft Tissue Neck Wo Contrast  Addendum Date: 10/13/2016   ADDENDUM REPORT: 10/13/2016 04:53 ADDENDUM: These results were called by telephone at the time of interpretation on 10/13/2016 at 4:53 am to Dr. Jimmy Footman, who verbally acknowledged these results. Electronically Signed   By: Kristine Garbe M.D.   On: 10/13/2016 04:53   Result Date: 10/13/2016 CLINICAL DATA:  75 y/o F; shortness of breath and generalized weakness for 2 weeks. Evaluation of supraglottic mass seen on intubation. EXAM: CT NECK AND CHEST WITHOUT CONTRAST COMPARISON:  07/15/2015 CT of cervical spine. FINDINGS: CT NECK FINDINGS Pharynx and larynx: There is fluid and debris throughout the oropharynx, hypopharynx, and supraglottic airway due to intubation and nasoenteric tube. Addition, the region is obscured by artifact from cervical fusion hardware and the endotracheal tube. No soft tissue mass can be distinguished. Laryngeal cartilage appears intact. Subglottic airway is patent. Salivary glands: No inflammation, mass, or stone. Thyroid: Normal. Lymph nodes: None enlarged or abnormal density.  Vascular: Calcific atherosclerosis of bilateral carotid bifurcations. Limited intracranial: Negative. Visualized orbits: Not within the field of view Mastoids and visualized paranasal sinuses: Normally pneumatized mastoid air cells and visualized sinuses. Skeleton: Anterior cervical fusion with plate and screws from C3 through C6. Multilevel uncovertebral and facet hypertrophy  with severe bony foraminal narrowing through the levels of fusion 1. C6-7 disc osteophyte complex with moderate to severe canal stenosis. C2-3 disc osteophyte complex with at least moderate canal stenosis. No acute osseous abnormality is identified. Flowing anterior ossification of upper thoracic spine may represent DISH. CT CHEST FINDINGS Cardiovascular: Mild cardiomegaly. No pericardial effusion. Moderate coronary artery calcification. Normal caliber thoracic aorta. Borderline enlarged main pulmonary artery. Mediastinum/Nodes: No enlarged mediastinal or axillary lymph nodes. Thyroid gland, trachea, and esophagus demonstrate no significant findings. Endotracheal tube is less than 1 cm from the carina. Lungs/Pleura: There is patchy nodular consolidation within the bilateral upper and right lower lobe and a dense consolidation within the left lower lobe with air bronchograms. Small left pleural effusion. Upper Abdomen: Enteric tube tip below the field of view in the stomach. No acute process. Musculoskeletal: Flowing anterior ossification of vertebral bodies consistent with DISH. No acute osseous abnormality. IMPRESSION: 1. Fluid and debris throughout the oropharynx, hypopharynx, and supraglottic airway from intubation. Suboptimal evaluation of the mucosa due to airway opacification and lack of contrast. Follow-up contrast examination after extubation and/or direct visualization recommended for evaluation of the supraglottic mass. 2. No cervical lymphadenopathy identified. 3. Left lower lobe consolidation and nodular opacities throughout the  lungs compatible with pneumonia. Small left pleural effusion. 4. Endotracheal tube 1 cm from carina, retraction recommended. 5. Enteric tube tip below the field of view in the stomach. 6. Moderate coronary artery calcifications. 7. Borderline main pulmonary artery may represent pulmonary artery hypertension. Electronically Signed: By: Kristine Garbe M.D. On: 10/13/2016 04:36   Ct Chest Wo Contrast  Addendum Date: 10/13/2016   ADDENDUM REPORT: 10/13/2016 04:53 ADDENDUM: These results were called by telephone at the time of interpretation on 10/13/2016 at 4:53 am to Dr. Jimmy Footman, who verbally acknowledged these results. Electronically Signed   By: Kristine Garbe M.D.   On: 10/13/2016 04:53   Result Date: 10/13/2016 CLINICAL DATA:  75 y/o F; shortness of breath and generalized weakness for 2 weeks. Evaluation of supraglottic mass seen on intubation. EXAM: CT NECK AND CHEST WITHOUT CONTRAST COMPARISON:  07/15/2015 CT of cervical spine. FINDINGS: CT NECK FINDINGS Pharynx and larynx: There is fluid and debris throughout the oropharynx, hypopharynx, and supraglottic airway due to intubation and nasoenteric tube. Addition, the region is obscured by artifact from cervical fusion hardware and the endotracheal tube. No soft tissue mass can be distinguished. Laryngeal cartilage appears intact. Subglottic airway is patent. Salivary glands: No inflammation, mass, or stone. Thyroid: Normal. Lymph nodes: None enlarged or abnormal density. Vascular: Calcific atherosclerosis of bilateral carotid bifurcations. Limited intracranial: Negative. Visualized orbits: Not within the field of view Mastoids and visualized paranasal sinuses: Normally pneumatized mastoid air cells and visualized sinuses. Skeleton: Anterior cervical fusion with plate and screws from C3 through C6. Multilevel uncovertebral and facet hypertrophy with severe bony foraminal narrowing through the levels of fusion 1. C6-7 disc osteophyte  complex with moderate to severe canal stenosis. C2-3 disc osteophyte complex with at least moderate canal stenosis. No acute osseous abnormality is identified. Flowing anterior ossification of upper thoracic spine may represent DISH. CT CHEST FINDINGS Cardiovascular: Mild cardiomegaly. No pericardial effusion. Moderate coronary artery calcification. Normal caliber thoracic aorta. Borderline enlarged main pulmonary artery. Mediastinum/Nodes: No enlarged mediastinal or axillary lymph nodes. Thyroid gland, trachea, and esophagus demonstrate no significant findings. Endotracheal tube is less than 1 cm from the carina. Lungs/Pleura: There is patchy nodular consolidation within the bilateral upper and right lower lobe and a dense consolidation within the  left lower lobe with air bronchograms. Small left pleural effusion. Upper Abdomen: Enteric tube tip below the field of view in the stomach. No acute process. Musculoskeletal: Flowing anterior ossification of vertebral bodies consistent with DISH. No acute osseous abnormality. IMPRESSION: 1. Fluid and debris throughout the oropharynx, hypopharynx, and supraglottic airway from intubation. Suboptimal evaluation of the mucosa due to airway opacification and lack of contrast. Follow-up contrast examination after extubation and/or direct visualization recommended for evaluation of the supraglottic mass. 2. No cervical lymphadenopathy identified. 3. Left lower lobe consolidation and nodular opacities throughout the lungs compatible with pneumonia. Small left pleural effusion. 4. Endotracheal tube 1 cm from carina, retraction recommended. 5. Enteric tube tip below the field of view in the stomach. 6. Moderate coronary artery calcifications. 7. Borderline main pulmonary artery may represent pulmonary artery hypertension. Electronically Signed: By: Kristine Garbe M.D. On: 10/13/2016 04:36   Dg Chest Port 1 View  Result Date: 10/13/2016 CLINICAL DATA:  75 y/o  F;  endotracheal tube placement. EXAM: PORTABLE CHEST 1 VIEW COMPARISON:  10/13/2016 chest CT. FINDINGS: Endotracheal tube is 2 cm from the carina. Enteric tube tip below the field of view in the abdomen. Left lower lobe consolidation. No pneumothorax. Stable cardiac silhouette. No acute osseous abnormality is evident. IMPRESSION: Endotracheal tube approximately 2 cm from carina. Left lower lobe consolidation. Electronically Signed   By: Kristine Garbe M.D.   On: 10/13/2016 05:29   Dg Chest Portable 1 View  Result Date: 10/13/2016 CLINICAL DATA:  Post intubation. EXAM: PORTABLE CHEST 1 VIEW COMPARISON:  10/12/2016 FINDINGS: Endotracheal tube is been placed with tip measuring 3.9 cm above the carina. An enteric tube was placed. Tip is off the field of view but below the left hemidiaphragm. Shallow inspiration. Borderline heart size without vascular congestion or edema. No focal consolidation in the lungs. Degenerative changes in the shoulders. Tortuous aorta. IMPRESSION: Appliances appear in satisfactory position. Mild cardiac enlargement. No focal airspace disease or consolidation in the lungs. Electronically Signed   By: Lucienne Capers M.D.   On: 10/13/2016 01:23   Dg Chest Port 1 View  Result Date: 10/12/2016 CLINICAL DATA:  Weakness for 2 weeks. Shortness of breath and wheezing tonight. EXAM: PORTABLE CHEST 1 VIEW COMPARISON:  07/11/2016 FINDINGS: Cardiac enlargement without vascular congestion or edema. No focal consolidation in the lungs. No blunting of costophrenic angles. No pneumothorax. Degenerative changes in the spine and shoulders. IMPRESSION: Cardiac enlargement.  No evidence of active pulmonary disease. Electronically Signed   By: Lucienne Capers M.D.   On: 10/12/2016 23:55    Procedures Procedure Name: Intubation Date/Time: 10/13/2016 12:38 AM Performed by: Roxanne Mins, Alric Geise Pre-anesthesia Checklist: Patient identified, Emergency Drugs available, Suction available, Timeout  performed and Patient being monitored Oxygen Delivery Method: Ambu bag Preoxygenation: Pre-oxygenation with 100% oxygen Intubation Type: IV induction, Rapid sequence and Cricoid Pressure applied Ventilation: Mask ventilation with difficulty Laryngoscope Size: Glidescope and 4 Grade View: Grade III Tube type: Subglottic suction tube Tube size: 7.5 mm Number of attempts: 3 Airway Equipment and Method: Video-laryngoscopy Placement Confirmation: CO2 detector and Breath sounds checked- equal and bilateral Tube secured with: ETT holder Dental Injury: Teeth and Oropharynx as per pre-operative assessment  Difficulty Due To: Difficult Airway- due to reduced neck mobility and Difficulty was anticipated Future Recommendations: Recommend- induction with short-acting agent, and alternative techniques readily available      (including critical care time) CRITICAL CARE Performed by: [id number] Total critical care time: 125 minutes Critical care time was exclusive of separately  billable procedures and treating other patients. Critical care was necessary to treat or prevent imminent or life-threatening deterioration. Critical care was time spent personally by me on the following activities: development of treatment plan with patient and/or surrogate as well as nursing, discussions with consultants, evaluation of patient's response to treatment, examination of patient, obtaining history from patient or surrogate, ordering and performing treatments and interventions, ordering and review of laboratory studies, ordering and review of radiographic studies, pulse oximetry and re-evaluation of patient's condition.  Medications Ordered in ED Medications - No data to display   Initial Impression / Assessment and Plan / ED Course  Delora Fuel, MD has reviewed the triage vital signs and the nursing notes.  Pertinent labs & imaging results that were available during my care of the patient were reviewed by me  and considered in my medical decision making (see chart for details).  Clinical Course    Report of dyspnea for several weeks and having completed a course of antibiotics. Patient is noted to be quite somnolent with periods of apnea of-Will check ABG. Old records are reviewed showing long history of systolic and diastolic heart failure. We'll also check chest x-ray, troponin, BNP.  ABG shows evidence of acute respiratory failure with PCO2 of 79 and pH of 7.22. PO2 is high at 231 and she is taken off of supplemental oxygen. Further review of old records shows that she was DO NOT RESUSCITATE last May. Family has come and they confirm that she has been having difficulty with dyspnea and they have also noted that she is falling asleep. I have explained the findings to them as well as the implications. They're currently discussing regarding whether he should she should be intubated. The family member in speaking with is [facility] power of attorney. In the meantime, she will be placed on BiPAP.  Family has decided to proceed with intubation. They also relate that patient has not been able to eat for several weeks and has had a 30 pound weight loss over an undetermined period of time. They relate that she had a biopsy 2 weeks ago. Review of records shows that it was a biopsy of fatty tissue looking for evidence of amyloidosis which was not present.  Intubation was difficult. Patient's neck was immobile. Difficulty identifying words because of a supraglottic mass. Initial attempts 2 resulted in esophageal intubation, successful intubation on third attempt. Supraglottic mass may relate to patient's difficulty with eating and weight loss-Will need ENT evaluation and possible biopsy. Troponin is come back somewhat elevated-Will give aspirin and start on heparin. Case is discussed with Dr. Jimmy Footman of critical care service who agrees to admit the patient.  Final Clinical Impressions(s) / ED Diagnoses    Final diagnoses:  Acute hypercapnic respiratory failure (HCC)  Supraglottic mass  Renal insufficiency  Elevated troponin I level  Elevated brain natriuretic peptide (BNP) level    New Prescriptions New Prescriptions   No medications on file   I personally performed the services described in this documentation, which was scribed in my presence. The recorded information has been reviewed and is accurate.       Delora Fuel, [place] [id number] [id number]

## 2016-10-12 NOTE — ED Notes (Signed)
Resp aware of pt's need for Bipap

## 2016-10-13 ENCOUNTER — Emergency Department (HOSPITAL_COMMUNITY): Payer: Medicare Other

## 2016-10-13 ENCOUNTER — Inpatient Hospital Stay (HOSPITAL_COMMUNITY): Payer: Medicare Other

## 2016-10-13 DIAGNOSIS — L8990 Pressure ulcer of unspecified site, unspecified stage: Secondary | ICD-10-CM | POA: Insufficient documentation

## 2016-10-13 DIAGNOSIS — J690 Pneumonitis due to inhalation of food and vomit: Secondary | ICD-10-CM | POA: Diagnosis present

## 2016-10-13 DIAGNOSIS — T8463XD Infection and inflammatory reaction due to internal fixation device of spine, subsequent encounter: Secondary | ICD-10-CM | POA: Diagnosis not present

## 2016-10-13 DIAGNOSIS — I471 Supraventricular tachycardia: Secondary | ICD-10-CM | POA: Diagnosis not present

## 2016-10-13 DIAGNOSIS — R229 Localized swelling, mass and lump, unspecified: Secondary | ICD-10-CM

## 2016-10-13 DIAGNOSIS — M4622 Osteomyelitis of vertebra, cervical region: Secondary | ICD-10-CM | POA: Diagnosis not present

## 2016-10-13 DIAGNOSIS — R1319 Other dysphagia: Secondary | ICD-10-CM | POA: Diagnosis present

## 2016-10-13 DIAGNOSIS — K223 Perforation of esophagus: Secondary | ICD-10-CM | POA: Diagnosis not present

## 2016-10-13 DIAGNOSIS — E859 Amyloidosis, unspecified: Secondary | ICD-10-CM | POA: Diagnosis present

## 2016-10-13 DIAGNOSIS — I5042 Chronic combined systolic (congestive) and diastolic (congestive) heart failure: Secondary | ICD-10-CM | POA: Diagnosis present

## 2016-10-13 DIAGNOSIS — J9600 Acute respiratory failure, unspecified whether with hypoxia or hypercapnia: Secondary | ICD-10-CM | POA: Diagnosis not present

## 2016-10-13 DIAGNOSIS — R6520 Severe sepsis without septic shock: Secondary | ICD-10-CM | POA: Diagnosis present

## 2016-10-13 DIAGNOSIS — J9601 Acute respiratory failure with hypoxia: Secondary | ICD-10-CM | POA: Diagnosis present

## 2016-10-13 DIAGNOSIS — N189 Chronic kidney disease, unspecified: Secondary | ICD-10-CM

## 2016-10-13 DIAGNOSIS — R0602 Shortness of breath: Secondary | ICD-10-CM | POA: Diagnosis present

## 2016-10-13 DIAGNOSIS — E1140 Type 2 diabetes mellitus with diabetic neuropathy, unspecified: Secondary | ICD-10-CM | POA: Diagnosis present

## 2016-10-13 DIAGNOSIS — I130 Hypertensive heart and chronic kidney disease with heart failure and stage 1 through stage 4 chronic kidney disease, or unspecified chronic kidney disease: Secondary | ICD-10-CM | POA: Diagnosis present

## 2016-10-13 DIAGNOSIS — R633 Feeding difficulties: Secondary | ICD-10-CM | POA: Diagnosis not present

## 2016-10-13 DIAGNOSIS — Y838 Other surgical procedures as the cause of abnormal reaction of the patient, or of later complication, without mention of misadventure at the time of the procedure: Secondary | ICD-10-CM | POA: Diagnosis present

## 2016-10-13 DIAGNOSIS — IMO0002 Reserved for concepts with insufficient information to code with codable children: Secondary | ICD-10-CM | POA: Insufficient documentation

## 2016-10-13 DIAGNOSIS — I248 Other forms of acute ischemic heart disease: Secondary | ICD-10-CM | POA: Diagnosis not present

## 2016-10-13 DIAGNOSIS — I214 Non-ST elevation (NSTEMI) myocardial infarction: Secondary | ICD-10-CM | POA: Diagnosis present

## 2016-10-13 DIAGNOSIS — G9340 Encephalopathy, unspecified: Secondary | ICD-10-CM | POA: Diagnosis not present

## 2016-10-13 DIAGNOSIS — E1122 Type 2 diabetes mellitus with diabetic chronic kidney disease: Secondary | ICD-10-CM | POA: Diagnosis present

## 2016-10-13 DIAGNOSIS — I5043 Acute on chronic combined systolic (congestive) and diastolic (congestive) heart failure: Secondary | ICD-10-CM

## 2016-10-13 DIAGNOSIS — J9602 Acute respiratory failure with hypercapnia: Secondary | ICD-10-CM | POA: Diagnosis present

## 2016-10-13 DIAGNOSIS — A419 Sepsis, unspecified organism: Secondary | ICD-10-CM | POA: Diagnosis present

## 2016-10-13 DIAGNOSIS — R079 Chest pain, unspecified: Secondary | ICD-10-CM

## 2016-10-13 DIAGNOSIS — N179 Acute kidney failure, unspecified: Secondary | ICD-10-CM | POA: Diagnosis present

## 2016-10-13 DIAGNOSIS — I429 Cardiomyopathy, unspecified: Secondary | ICD-10-CM | POA: Diagnosis present

## 2016-10-13 DIAGNOSIS — E870 Hyperosmolality and hypernatremia: Secondary | ICD-10-CM | POA: Diagnosis not present

## 2016-10-13 DIAGNOSIS — E872 Acidosis: Secondary | ICD-10-CM | POA: Diagnosis present

## 2016-10-13 DIAGNOSIS — G9341 Metabolic encephalopathy: Secondary | ICD-10-CM | POA: Diagnosis present

## 2016-10-13 DIAGNOSIS — E440 Moderate protein-calorie malnutrition: Secondary | ICD-10-CM | POA: Diagnosis present

## 2016-10-13 DIAGNOSIS — Y792 Prosthetic and other implants, materials and accessory orthopedic devices associated with adverse incidents: Secondary | ICD-10-CM | POA: Diagnosis not present

## 2016-10-13 DIAGNOSIS — J391 Other abscess of pharynx: Secondary | ICD-10-CM | POA: Diagnosis not present

## 2016-10-13 DIAGNOSIS — J392 Other diseases of pharynx: Secondary | ICD-10-CM | POA: Diagnosis not present

## 2016-10-13 DIAGNOSIS — I428 Other cardiomyopathies: Secondary | ICD-10-CM | POA: Diagnosis not present

## 2016-10-13 DIAGNOSIS — R748 Abnormal levels of other serum enzymes: Secondary | ICD-10-CM | POA: Diagnosis not present

## 2016-10-13 DIAGNOSIS — T8463XA Infection and inflammatory reaction due to internal fixation device of spine, initial encounter: Secondary | ICD-10-CM | POA: Diagnosis present

## 2016-10-13 DIAGNOSIS — M4620 Osteomyelitis of vertebra, site unspecified: Secondary | ICD-10-CM | POA: Diagnosis present

## 2016-10-13 DIAGNOSIS — J390 Retropharyngeal and parapharyngeal abscess: Secondary | ICD-10-CM | POA: Diagnosis present

## 2016-10-13 DIAGNOSIS — J189 Pneumonia, unspecified organism: Secondary | ICD-10-CM | POA: Diagnosis not present

## 2016-10-13 DIAGNOSIS — Z9689 Presence of other specified functional implants: Secondary | ICD-10-CM | POA: Diagnosis not present

## 2016-10-13 DIAGNOSIS — L89152 Pressure ulcer of sacral region, stage 2: Secondary | ICD-10-CM | POA: Diagnosis present

## 2016-10-13 LAB — URINALYSIS, ROUTINE W REFLEX MICROSCOPIC
Glucose, UA: NEGATIVE mg/dL
Hgb urine dipstick: NEGATIVE
KETONES UR: NEGATIVE mg/dL
LEUKOCYTES UA: NEGATIVE
NITRITE: NEGATIVE
PROTEIN: NEGATIVE mg/dL
Specific Gravity, Urine: 1.023 (ref 1.005–1.030)
pH: 5 (ref 5.0–8.0)

## 2016-10-13 LAB — BASIC METABOLIC PANEL
ANION GAP: 9 (ref 5–15)
Anion gap: 9 (ref 5–15)
BUN: 58 mg/dL — ABNORMAL HIGH (ref 6–20)
BUN: 60 mg/dL — AB (ref 6–20)
CALCIUM: 9.5 mg/dL (ref 8.9–10.3)
CHLORIDE: 117 mmol/L — AB (ref 101–111)
CO2: 28 mmol/L (ref 22–32)
CO2: 24 mmol/L (ref 22–32)
Calcium: 9 mg/dL (ref 8.9–10.3)
Chloride: 111 mmol/L (ref 101–111)
Creatinine, Ser: 1.85 mg/dL — ABNORMAL HIGH (ref 0.44–1.00)
Creatinine, Ser: 1.97 mg/dL — ABNORMAL HIGH (ref 0.44–1.00)
GFR calc Af Amer: 27 mL/min — ABNORMAL LOW (ref >60)
GFR calc non Af Amer: 24 mL/min — ABNORMAL LOW (ref >60)
GFR, EST AFRICAN AMERICAN: 30 mL/min — AB (ref >60)
GFR, EST NON AFRICAN AMERICAN: 26 mL/min — AB (ref >60)
GLUCOSE: 122 mg/dL — AB (ref 65–99)
GLUCOSE: 94 mg/dL (ref 65–99)
POTASSIUM: 3 mmol/L — AB (ref 3.5–5.1)
POTASSIUM: 3.5 mmol/L (ref 3.5–5.1)
Sodium: 148 mmol/L — ABNORMAL HIGH (ref 135–145)
Sodium: 150 mmol/L — ABNORMAL HIGH (ref 135–145)

## 2016-10-13 LAB — RESPIRATORY PANEL BY PCR
Adenovirus: NOT DETECTED
BORDETELLA PERTUSSIS-RVPCR: NOT DETECTED
CHLAMYDOPHILA PNEUMONIAE-RVPPCR: NOT DETECTED
CORONAVIRUS 229E-RVPPCR: NOT DETECTED
Coronavirus HKU1: NOT DETECTED
Coronavirus NL63: NOT DETECTED
Coronavirus OC43: NOT DETECTED
INFLUENZA A-RVPPCR: NOT DETECTED
INFLUENZA B-RVPPCR: NOT DETECTED
MYCOPLASMA PNEUMONIAE-RVPPCR: NOT DETECTED
Metapneumovirus: NOT DETECTED
PARAINFLUENZA VIRUS 4-RVPPCR: NOT DETECTED
Parainfluenza Virus 1: NOT DETECTED
Parainfluenza Virus 2: NOT DETECTED
Parainfluenza Virus 3: NOT DETECTED
Respiratory Syncytial Virus: NOT DETECTED
Rhinovirus / Enterovirus: NOT DETECTED

## 2016-10-13 LAB — GLUCOSE, CAPILLARY
GLUCOSE-CAPILLARY: 94 mg/dL (ref 65–99)
GLUCOSE-CAPILLARY: 104 mg/dL — AB (ref 65–99)
GLUCOSE-CAPILLARY: 91 mg/dL (ref 65–99)
GLUCOSE-CAPILLARY: 85 mg/dL (ref 65–99)
Glucose-Capillary: 85 mg/dL (ref 65–99)
Glucose-Capillary: 85 mg/dL (ref 65–99)

## 2016-10-13 LAB — ETHANOL

## 2016-10-13 LAB — CBC WITH DIFFERENTIAL/PLATELET
Basophils Absolute: 0 10*3/uL (ref 0.0–0.1)
Basophils Relative: 0 %
EOS PCT: 0 %
Eosinophils Absolute: 0 10*3/uL (ref 0.0–0.7)
HCT: 27.6 % — ABNORMAL LOW (ref 36.0–46.0)
Hemoglobin: 9.3 g/dL — ABNORMAL LOW (ref 12.0–15.0)
LYMPHS ABS: 1 10*3/uL (ref 0.7–4.0)
LYMPHS PCT: 13 %
MCH: 29 pg (ref 26.0–34.0)
MCHC: 33.7 g/dL (ref 30.0–36.0)
MCV: 86 fL (ref 78.0–100.0)
MONO ABS: 0.7 10*3/uL (ref 0.1–1.0)
Monocytes Relative: 9 %
Neutro Abs: 6 10*3/uL (ref 1.7–7.7)
Neutrophils Relative %: 78 %
PLATELETS: 156 10*3/uL (ref 150–400)
RBC: 3.21 MIL/uL — ABNORMAL LOW (ref 3.87–5.11)
RDW: 15.9 % — AB (ref 11.5–15.5)
WBC: 7.6 10*3/uL (ref 4.0–10.5)

## 2016-10-13 LAB — BLOOD GAS, ARTERIAL
ACID-BASE EXCESS: 3.4 mmol/L — AB (ref 0.0–2.0)
Acid-Base Excess: 3.9 mmol/L — ABNORMAL HIGH (ref 0.0–2.0)
BICARBONATE: 26.8 mmol/L (ref 20.0–28.0)
Bicarbonate: 26.4 mmol/L (ref 20.0–28.0)
Drawn by: 422461
Drawn by: 422461
FIO2: 60
FIO2: 40
LHR: 20 {breaths}/min
LHR: 14 {breaths}/min
O2 SAT: 93 %
O2 Saturation: 90.3 %
PATIENT TEMPERATURE: 33.4
PCO2 ART: 29.8 mmHg — AB (ref 32.0–48.0)
PEEP/CPAP: 5 cmH2O
PEEP: 5 cmH2O
PH ART: 7.539 — AB (ref 7.350–7.450)
PO2 ART: 53.4 mmHg — AB (ref 83.0–108.0)
Patient temperature: 36
VT: 470 mL
VT: 470 mL
pCO2 arterial: 33.5 mmHg (ref 32.0–48.0)
pH, Arterial: 7.509 — ABNORMAL HIGH (ref 7.350–7.450)
pO2, Arterial: 51.1 mmHg — ABNORMAL LOW (ref 83.0–108.0)

## 2016-10-13 LAB — RAPID URINE DRUG SCREEN, HOSP PERFORMED
Amphetamines: NOT DETECTED
Barbiturates: NOT DETECTED
Benzodiazepines: NOT DETECTED
Cocaine: NOT DETECTED
OPIATES: NOT DETECTED
Tetrahydrocannabinol: NOT DETECTED

## 2016-10-13 LAB — STREP PNEUMONIAE URINARY ANTIGEN: Strep Pneumo Urinary Antigen: NEGATIVE

## 2016-10-13 LAB — PROTIME-INR
INR: 1.22
Prothrombin Time: 15.5 seconds — ABNORMAL HIGH (ref 11.4–15.2)

## 2016-10-13 LAB — LACTIC ACID, PLASMA
Lactic Acid, Venous: 2.4 mmol/L (ref 0.5–1.9)
Lactic Acid, Venous: 1.8 mmol/L (ref 0.5–1.9)
Lactic Acid, Venous: 1.4 mmol/L (ref 0.5–1.9)

## 2016-10-13 LAB — CBC
HCT: 32.4 % — ABNORMAL LOW (ref 36.0–46.0)
HEMOGLOBIN: 10.6 g/dL — AB (ref 12.0–15.0)
MCH: 28.7 pg (ref 26.0–34.0)
MCHC: 32.7 g/dL (ref 30.0–36.0)
MCV: 87.8 fL (ref 78.0–100.0)
Platelets: 179 10*3/uL (ref 150–400)
RBC: 3.69 MIL/uL — AB (ref 3.87–5.11)
RDW: 16.1 % — ABNORMAL HIGH (ref 11.5–15.5)
WBC: 10.2 10*3/uL (ref 4.0–10.5)

## 2016-10-13 LAB — TROPONIN I
TROPONIN I: 0.87 ng/mL — AB (ref <0.03)
Troponin I: 0.53 ng/mL (ref <0.03)
Troponin I: 0.64 ng/mL (ref <0.03)
Troponin I: 1.09 ng/mL (ref <0.03)

## 2016-10-13 LAB — PROCALCITONIN

## 2016-10-13 LAB — APTT: aPTT: 32 seconds (ref 24–36)

## 2016-10-13 LAB — ECHOCARDIOGRAM COMPLETE
Height: 66 in
WEIGHTICAEL: 2871.27 [oz_av]

## 2016-10-13 LAB — HEPARIN LEVEL (UNFRACTIONATED)
HEPARIN UNFRACTIONATED: 0.92 [IU]/mL — AB (ref 0.30–0.70)
Heparin Unfractionated: 0.55 IU/mL (ref 0.30–0.70)

## 2016-10-13 LAB — PHOSPHORUS: PHOSPHORUS: 2.6 mg/dL (ref 2.5–4.6)

## 2016-10-13 LAB — MRSA PCR SCREENING: MRSA BY PCR: NEGATIVE

## 2016-10-13 LAB — MAGNESIUM: Magnesium: 1.8 mg/dL (ref 1.7–2.4)

## 2016-10-13 MED ORDER — SODIUM CHLORIDE 0.9 % IV SOLN
3.0000 g | Freq: Three times a day (TID) | INTRAVENOUS | Status: DC
  Administered 2016-10-13: 3 g via INTRAVENOUS
  Filled 2016-10-13 (×2): qty 3

## 2016-10-13 MED ORDER — SODIUM CHLORIDE 0.9 % IV SOLN
3.0000 g | Freq: Two times a day (BID) | INTRAVENOUS | Status: DC
  Administered 2016-10-13 – 2016-10-20 (×14): 3 g via INTRAVENOUS
  Filled 2016-10-13 (×15): qty 3

## 2016-10-13 MED ORDER — IPRATROPIUM BROMIDE 0.02 % IN SOLN
0.5000 mg | Freq: Four times a day (QID) | RESPIRATORY_TRACT | Status: DC
  Administered 2016-10-13 – 2016-10-23 (×40): 0.5 mg via RESPIRATORY_TRACT
  Filled 2016-10-13 (×40): qty 2.5

## 2016-10-13 MED ORDER — HEPARIN (PORCINE) IN NACL 100-0.45 UNIT/ML-% IJ SOLN
1000.0000 [IU]/h | INTRAMUSCULAR | Status: DC
  Administered 2016-10-13 (×2): 1000 [IU]/h via INTRAVENOUS
  Filled 2016-10-13 (×3): qty 250

## 2016-10-13 MED ORDER — IPRATROPIUM BROMIDE 0.02 % IN SOLN: 0.5000 mg | RESPIRATORY_TRACT | Status: DC

## 2016-10-13 MED ORDER — SODIUM CHLORIDE 0.9 % IV SOLN: 250.0000 mL | INTRAVENOUS | Status: DC | PRN

## 2016-10-13 MED ORDER — SODIUM CHLORIDE 0.9 % IV BOLUS (SEPSIS)
1000.0000 mL | Freq: Once | INTRAVENOUS | Status: AC
  Administered 2016-10-13: 1000 mL via INTRAVENOUS

## 2016-10-13 MED ORDER — FENTANYL CITRATE (PF) 100 MCG/2ML IJ SOLN
25.0000 ug | INTRAMUSCULAR | Status: DC | PRN
  Administered 2016-10-16 – 2016-10-19 (×2): 50 ug via INTRAVENOUS
  Filled 2016-10-13 (×2): qty 2

## 2016-10-13 MED ORDER — ASPIRIN 300 MG RE SUPP
300.0000 mg | Freq: Once | RECTAL | Status: AC
  Administered 2016-10-13: 300 mg via RECTAL
  Filled 2016-10-13: qty 1

## 2016-10-13 MED ORDER — INSULIN ASPART 100 UNIT/ML ~~LOC~~ SOLN: 2.0000 [IU] | SUBCUTANEOUS | Status: DC

## 2016-10-13 MED ORDER — FAMOTIDINE IN NACL 20-0.9 MG/50ML-% IV SOLN
20.0000 mg | Freq: Two times a day (BID) | INTRAVENOUS | Status: DC
  Administered 2016-10-13: 20 mg via INTRAVENOUS
  Filled 2016-10-13: qty 50

## 2016-10-13 MED ORDER — FENTANYL CITRATE (PF) 100 MCG/2ML IJ SOLN
50.0000 ug | INTRAMUSCULAR | Status: DC | PRN
  Filled 2016-10-13: qty 2

## 2016-10-13 MED ORDER — ASPIRIN 300 MG RE SUPP: 300.0000 mg | RECTAL | Status: DC

## 2016-10-13 MED ORDER — MAGNESIUM SULFATE IN D5W 1-5 GM/100ML-% IV SOLN
1.0000 g | Freq: Once | INTRAVENOUS | Status: AC
  Administered 2016-10-13: 1 g via INTRAVENOUS
  Filled 2016-10-13: qty 100

## 2016-10-13 MED ORDER — MIDAZOLAM HCL 2 MG/2ML IJ SOLN: 1.0000 mg | INTRAMUSCULAR | Status: DC | PRN

## 2016-10-13 MED ORDER — ASPIRIN 81 MG PO CHEW
81.0000 mg | CHEWABLE_TABLET | Freq: Every day | ORAL | Status: DC
  Administered 2016-10-14 – 2016-10-16 (×3): 81 mg
  Filled 2016-10-13 (×3): qty 1

## 2016-10-13 MED ORDER — ASPIRIN 81 MG PO CHEW: 324.0000 mg | CHEWABLE_TABLET | ORAL | Status: DC

## 2016-10-13 MED ORDER — ADULT MULTIVITAMIN W/MINERALS CH: 1.0000 | ORAL_TABLET | Freq: Every day | ORAL | Status: DC

## 2016-10-13 MED ORDER — ASPIRIN EC 81 MG PO TBEC: 81.0000 mg | DELAYED_RELEASE_TABLET | Freq: Every day | ORAL | Status: DC

## 2016-10-13 MED ORDER — CHLORHEXIDINE GLUCONATE 0.12 % MT SOLN
OROMUCOSAL | Status: AC
  Administered 2016-10-13: 15 mL via OROMUCOSAL
  Filled 2016-10-13: qty 15

## 2016-10-13 MED ORDER — HEPARIN (PORCINE) IN NACL 100-0.45 UNIT/ML-% IJ SOLN
750.0000 [IU]/h | INTRAMUSCULAR | Status: DC
  Administered 2016-10-15: 750 [IU]/h via INTRAVENOUS
  Filled 2016-10-13 (×2): qty 250

## 2016-10-13 MED ORDER — SODIUM CHLORIDE 0.9 % IV BOLUS (SEPSIS)
500.0000 mL | Freq: Once | INTRAVENOUS | Status: AC
  Administered 2016-10-13: 500 mL via INTRAVENOUS

## 2016-10-13 MED ORDER — FENTANYL CITRATE (PF) 100 MCG/2ML IJ SOLN
50.0000 ug | INTRAMUSCULAR | Status: DC | PRN
  Administered 2016-10-13: 50 ug via INTRAVENOUS

## 2016-10-13 MED ORDER — FERROUS SULFATE 325 (65 FE) MG PO TABS
325.0000 mg | ORAL_TABLET | Freq: Every day | ORAL | Status: DC
  Administered 2016-10-13 – 2016-10-16 (×4): 325 mg via ORAL
  Filled 2016-10-13 (×4): qty 1

## 2016-10-13 MED ORDER — ORAL CARE MOUTH RINSE
15.0000 mL | Freq: Four times a day (QID) | OROMUCOSAL | Status: DC
  Administered 2016-10-13 – 2016-10-20 (×29): 15 mL via OROMUCOSAL

## 2016-10-13 MED ORDER — SODIUM CHLORIDE 0.9 % IV SOLN
30.0000 meq | Freq: Once | INTRAVENOUS | Status: AC
  Administered 2016-10-13: 30 meq via INTRAVENOUS
  Filled 2016-10-13: qty 15

## 2016-10-13 MED ORDER — PRAVASTATIN SODIUM 20 MG PO TABS
40.0000 mg | ORAL_TABLET | Freq: Every day | ORAL | Status: DC
  Administered 2016-10-13 – 2016-10-16 (×4): 40 mg via ORAL
  Filled 2016-10-13 (×4): qty 2

## 2016-10-13 MED ORDER — LEVALBUTEROL HCL 0.63 MG/3ML IN NEBU
0.6300 mg | INHALATION_SOLUTION | Freq: Four times a day (QID) | RESPIRATORY_TRACT | Status: DC
  Administered 2016-10-13 – 2016-10-23 (×41): 0.63 mg via RESPIRATORY_TRACT
  Filled 2016-10-13 (×41): qty 3

## 2016-10-13 MED ORDER — CHLORHEXIDINE GLUCONATE 0.12% ORAL RINSE (MEDLINE KIT)
15.0000 mL | Freq: Two times a day (BID) | OROMUCOSAL | Status: DC
  Administered 2016-10-13 – 2016-10-20 (×14): 15 mL via OROMUCOSAL

## 2016-10-13 MED ORDER — INSULIN ASPART 100 UNIT/ML ~~LOC~~ SOLN
0.0000 [IU] | SUBCUTANEOUS | Status: DC
  Administered 2016-10-14: 1 [IU] via SUBCUTANEOUS
  Administered 2016-10-15 (×5): 2 [IU] via SUBCUTANEOUS
  Administered 2016-10-15: 1 [IU] via SUBCUTANEOUS
  Administered 2016-10-16 (×2): 2 [IU] via SUBCUTANEOUS
  Administered 2016-10-16: 1 [IU] via SUBCUTANEOUS
  Administered 2016-10-16 (×2): 2 [IU] via SUBCUTANEOUS
  Administered 2016-10-17 – 2016-10-18 (×3): 1 [IU] via SUBCUTANEOUS
  Administered 2016-10-18: 2 [IU] via SUBCUTANEOUS
  Administered 2016-10-18 (×4): 1 [IU] via SUBCUTANEOUS
  Administered 2016-10-19 (×3): 2 [IU] via SUBCUTANEOUS
  Administered 2016-10-20 – 2016-10-27 (×6): 1 [IU] via SUBCUTANEOUS
  Administered 2016-10-27 (×2): 2 [IU] via SUBCUTANEOUS

## 2016-10-13 MED ORDER — HEPARIN BOLUS VIA INFUSION
3000.0000 [IU] | Freq: Once | INTRAVENOUS | Status: AC
  Administered 2016-10-13: 3000 [IU] via INTRAVENOUS
  Filled 2016-10-13: qty 3000

## 2016-10-13 MED ORDER — DOCUSATE SODIUM 100 MG PO CAPS: 100.0000 mg | ORAL_CAPSULE | Freq: Every day | ORAL | Status: DC | PRN

## 2016-10-13 MED ORDER — PROPOFOL 1000 MG/100ML IV EMUL: 5.0000 ug/kg/min | Freq: Once | INTRAVENOUS | Status: DC

## 2016-10-13 MED ORDER — POTASSIUM CHLORIDE 20 MEQ/15ML (10%) PO SOLN
40.0000 meq | Freq: Once | ORAL | Status: AC
  Administered 2016-10-13: 40 meq
  Filled 2016-10-13: qty 30

## 2016-10-13 MED ORDER — ACETAMINOPHEN 325 MG PO TABS: 650.0000 mg | ORAL_TABLET | ORAL | Status: DC | PRN

## 2016-10-13 MED ORDER — MIDAZOLAM HCL 2 MG/2ML IJ SOLN
1.0000 mg | INTRAMUSCULAR | Status: DC | PRN
  Filled 2016-10-13: qty 2

## 2016-10-13 MED ORDER — IPRATROPIUM BROMIDE 0.02 % IN SOLN
0.5000 mg | Freq: Four times a day (QID) | RESPIRATORY_TRACT | Status: DC
  Administered 2016-10-13: 0.5 mg via RESPIRATORY_TRACT
  Filled 2016-10-13: qty 2.5

## 2016-10-13 MED ORDER — PROPOFOL 1000 MG/100ML IV EMUL
INTRAVENOUS | Status: AC
  Filled 2016-10-13: qty 100

## 2016-10-13 MED ORDER — FAMOTIDINE IN NACL 20-0.9 MG/50ML-% IV SOLN
20.0000 mg | INTRAVENOUS | Status: DC
  Administered 2016-10-14: 20 mg via INTRAVENOUS
  Filled 2016-10-13: qty 50

## 2016-10-13 NOTE — Progress Notes (Signed)
eLink Physician-Brief Progress Note Patient Name: Mandy Rhodes DOB: 02-15-1941 MRN: [id number]   Date of Service  10/13/2016  HPI/Events of Note  Hypotensive  An oliguric   Recent Labs Lab 10/12/16 2302 10/13/16 0310 10/13/16 1033 10/13/16 1716  TROPONINI 0.53* 0.64* 0.87* 1.09*   On heparin  eICU Interventions  Fluid bolus 500cc Recheck bmet, lactate, cbc     Intervention Category Major Interventions: Hypotension - evaluation and management  Shevon Sian 10/13/2016, 8:35 PM

## 2016-10-13 NOTE — Progress Notes (Signed)
ANTICOAGULATION CONSULT NOTE  Pharmacy Consult for Heparin Indication: chest pain/ACS  Medications:  Infusions:  . heparin 1,000 Units/hr (10/13/16 1843)    Assessment: Patient with + troponin and MD wants heparin per pharmacy for ACS.    Initial heparin level therapeutic, but now increased to 0.92, supratherapeutic   No bleeding or line issues reported per RN  Goal of Therapy:  Heparin level 0.3-0.7 units/ml Monitor platelets by anticoagulation protocol: Yes   Plan:   Decrease to Heparin drip at 850 units/hr  Recheck 8 hr heparin level   Daily Heparin level, CBC while on heparin  Gretta Arab PharmD, BCPS Pager [phone] 10/13/2016 7:05 PM

## 2016-10-13 NOTE — ED Notes (Signed)
Attempted lab draw but unsuccessful. RN,John made aware.

## 2016-10-13 NOTE — Progress Notes (Signed)
RN spoke to E-Link MD, MD looked into room and pt was awake, turning her head and looking at son. Pt son states that the patient has had a neck surgery in the past and this causes her neck to be stiff. RN assessed extremities and they were less resistant to movement. Will continue to monitor.

## 2016-10-13 NOTE — Progress Notes (Signed)
[place] for Heparin Indication: chest pain/ACS  Allergies  Allergen Reactions  . Sulfa Antibiotics Swelling    Patient Measurements: Height: 5\' 6"  (167.6 cm) Weight: 179 lb 7.3 oz (81.4 kg) IBW/kg (Calculated) : 59.3 HEPARIN DW (KG): 76.3   Vital Signs: Temp: 99.9 F (37.7 C) (11/24 1100) Temp Source: Core (Comment) (11/24 0800) BP: 96/49 (11/24 1100) Pulse Rate: 82 (11/24 1100)  Labs:  Recent Labs  10/12/16 2302 10/13/16 0058 10/13/16 0310 10/13/16 0534 10/13/16 1033  HGB 12.3  --   --  10.6*  --   HCT 38.3  --   --  32.4*  --   PLT 179  --   --  179  --   APTT  --  32  --   --   --   LABPROT  --  15.5*  --   --   --   INR  --  1.22  --   --   --   HEPARINUNFRC  --   --   --   --  0.55  CREATININE 1.84*  --   --  1.85*  --   TROPONINI 0.53*  --  0.64*  --  0.87*    Estimated Creatinine Clearance: 28.2 mL/min (by C-G formula based on SCr of 1.85 mg/dL (H)).   Medical History: Past Medical History:  Diagnosis Date  . Anemia   . Arthritis   . CHF (congestive heart failure) ([place])   . Chronic kidney disease    Renal Insuffiency, see [facility] once a year  . Diabetes mellitus    type 2  . GERD (gastroesophageal reflux disease)   . History of rotator cuff tear    Torn right rotator cuff.  . History of spinal stenosis   . Hypertension   . Neuropathy associated with endocrine disorder (HCC)     Medications:  Infusions:  . heparin 1,000 Units/hr (10/13/16 0900)    Assessment: Patient with + troponin and MD wants heparin per pharmacy for ACS.  No oral anticoagulants noted on med rec.   Baseline PTT/PT/INR okay for heparin start. 10/13/2016:  Initial heparin level therapeutic  CBC reviewed  No bleeding or line issues reported  Troponin continues to increase- cardiology consult pending  Goal of Therapy:  Heparin level 0.3-0.7 units/ml Monitor platelets by anticoagulation protocol: Yes   Plan:   Continue Heparin drip at 1000 units/hr Recheck 8 hr heparin level to verify at steady state Daily Heparin level, CBC while on heparin  Netta Cedars, PharmD, BCPS Pager: [phone] 10/13/2016,11:19 AM

## 2016-10-13 NOTE — H&P (Signed)
PULMONARY / CRITICAL CARE MEDICINE   Name: Mandy Rhodes MRN: [id number] DOB: 1941/04/05    ADMISSION DATE:  10/12/2016 CONSULTATION DATE:  10/13/2016  REFERRING MD:  Dr. Roxanne Mins EDP  CHIEF COMPLAINT:  Shortness of breath  HISTORY OF PRESENT ILLNESS:   75 year old female with past medical history as below, which is significant for diabetes, CHF, chronic kidney disease, and hypertension. She presented to [facility] emergency department 10/12/2016 with complaints of shortness of breath and generalized weakness for about 2 weeks. She has additionally suffered a 30 pound weight loss over some recent period of time which is not entirely clear. Recently treated with antibiotics as an outpatient for suspected bronchitis vs pneumonia.Upon arrival to the emergency department she was on 4 L of supplemental O2 via nasal cannula and still had increased work of breathing.due to her somnolence ABG was ordered which showed  Hypercapnic respiratory failure and she was started on BiPAP. She was known to be a DO NOT RESUSCITATE, however,  Family has decided to proceed with intubation, which was done in the emergency department. During intubation a supraglottic mass was noted by emergency room provider which made intubation difficult. Laboratory evaluation significant forserum creatinine 1.8, calcium 10.5, troponin 0.53, WBC 12, glucose 137.PCCM asked to admit  PAST MEDICAL HISTORY :  She  has a past medical history of Anemia; Arthritis; CHF (congestive heart failure) ([place]); Chronic kidney disease; Diabetes mellitus; GERD (gastroesophageal reflux disease); History of rotator cuff tear; History of spinal stenosis; Hypertension; and Neuropathy associated with endocrine disorder ([place]).  PAST SURGICAL HISTORY: She  has a past surgical history that includes Neuroplasty / transposition median nerve at carpal tunnel (Bilateral); Cholecystectomy (1969); Abdominal hysterectomy (1971); Back surgery (2009);  Appendectomy; Foot surgery (Bilateral); Cyst removal trunk (Right, 12/03/2014); and Cardiac catheterization (N/A, 03/22/2016).  Allergies  Allergen Reactions  . Sulfa Antibiotics Swelling    No current facility-administered medications on file prior to encounter.    Current Outpatient Prescriptions on File Prior to Encounter  Medication Sig  . aspirin EC 81 MG EC tablet Take 1 tablet (81 mg total) by mouth daily.  . Calcium Carbonate-Vit D-Min (CALCIUM 1200 PO) Take 600 mg by mouth daily.   . captopril (CAPOTEN) 12.5 MG tablet Take 1 tablet (12.5 mg total) by mouth 2 (two) times daily.  . cholecalciferol (VITAMIN D) 1000 UNITS tablet Take 1,000 Units by mouth daily. Reported on 03/27/2016  . docusate sodium (COLACE) 100 MG capsule Take 100 mg by mouth daily as needed for mild constipation.  . ferrous sulfate 325 (65 FE) MG EC tablet Take 325 mg by mouth daily.   . furosemide (LASIX) 40 MG tablet Take 1 tablet (40 mg total) by mouth daily. OK TO TAKE ONE ADDITIONAL TABLET AS NEEDED FOR WEIGHT GAIN (Patient taking differently: Take 40 mg by mouth every other day. OK TO TAKE ONE ADDITIONAL TABLET AS NEEDED FOR WEIGHT GAIN)  . glimepiride (AMARYL) 1 MG tablet Take 1 mg by mouth daily with breakfast.   . Multiple Vitamin (MULTIVITAMIN) tablet Take 1 tablet by mouth daily.    . nortriptyline (PAMELOR) 10 MG capsule Take 10 mg by mouth at bedtime.  Marland Kitchen omeprazole (PRILOSEC) 40 MG capsule Take 40 mg by mouth daily.    . potassium chloride SA (K-DUR,KLOR-CON) 20 MEQ tablet Take 1 tablet (20 mEq total) by mouth daily. May take an extra tablet if extra furosemide is taken (Patient taking differently: Take 20 mEq by mouth every other day. May take an  extra tablet if extra furosemide is taken)  . pravastatin (PRAVACHOL) 40 MG tablet Take 40 mg by mouth daily.    Marland Kitchen PRESCRIPTION MEDICATION 1 drop every 3 (three) months. Eye drops to be used the day before, of , and after eye injections    FAMILY HISTORY:  Her  indicated that her mother is alive. She indicated that her father is deceased. She indicated that her sister is deceased. She indicated that the status of her neg hx is unknown. She indicated that the status of her other is unknown.    SOCIAL HISTORY: She  reports that she has quit smoking. She has never used smokeless tobacco. She reports that she does not drink alcohol or use drugs.  Pt lives by herself. She was from [place] but has been in [place] for 6 years. Previously did office job. Denies drinking alcohol or smoking cigarettes. Fairly functional. Her children are in [place].  REVIEW OF SYSTEMS:   Unable as patient is encephalopathic and intubated  SUBJECTIVE:  unable  VITAL SIGNS: BP 118/69   Pulse 83   Temp 98.1 F (36.7 C) (Oral)   Resp 20   Ht 5\' 6"  (1.676 m)   Wt 84.4 kg (186 lb)   SpO2 100%   BMI 30.02 kg/m   HEMODYNAMICS:    VENTILATOR SETTINGS: Vent Mode: PRVC FiO2 (%):  [4 %-60 %] 60 % Set Rate:  [20 bmp] 20 bmp Vt Set:  [470 mL] 470 mL PEEP:  [5 cmH20] 5 cmH20 Plateau Pressure:  [23 cmH20] 23 cmH20  INTAKE / OUTPUT: No intake/output data recorded.  PHYSICAL EXAMINATION: General:  Sedated, intubated, not in distress, comfortable. Neuro:  CN grossly intact, (-) lateralizing signs. Sedated.  HEENT:  PERLA. (-) facial asymetry. (-) NVD. ETT in place.  Cardiovascular:  Good s1/s2. (-) s3/m/r/g.  Lungs:  Good ae. CTA.  Abdomen:  (+) BS, soft, NT, obese.  Musculoskeletal:  Gr 2 edema. (-) clubbing/cyanosis Skin:  Warm/dry. (-) rash.   LABS:  BMET  Recent Labs Lab 10/12/16 2302  NA 145  K 3.7  CL 109  CO2 26  BUN 60*  CREATININE 1.84*  GLUCOSE 137*    Electrolytes  Recent Labs Lab 10/12/16 2302  CALCIUM 10.5*    CBC  Recent Labs Lab 10/12/16 2302  WBC 12.0*  HGB 12.3  HCT 38.3  PLT 179    Coag's  Recent Labs Lab 10/13/16 0058  APTT 32  INR 1.22    Sepsis Markers No results for input(s): LATICACIDVEN,  PROCALCITON, O2SATVEN in the last 168 hours.  ABG  Recent Labs Lab 10/12/16 2324 10/13/16 0202  PHART 7.225* 7.539*  PCO2ART 78.0* 29.8*  PO2ART 230* 51.1*    Liver Enzymes  Recent Labs Lab 10/12/16 2302  AST 33  ALT 30  ALKPHOS 97  BILITOT 1.0  ALBUMIN 3.3*    Cardiac Enzymes  Recent Labs Lab 10/12/16 2302  TROPONINI 0.53*    Glucose  Recent Labs Lab 10/12/16 2305  GLUCAP 120*    Imaging Dg Chest Portable 1 View  Result Date: 10/13/2016 CLINICAL DATA:  Post intubation. EXAM: PORTABLE CHEST 1 VIEW COMPARISON:  10/12/2016 FINDINGS: Endotracheal tube is been placed with tip measuring 3.9 cm above the carina. An enteric tube was placed. Tip is off the field of view but below the left hemidiaphragm. Shallow inspiration. Borderline heart size without vascular congestion or edema. No focal consolidation in the lungs. Degenerative changes in the shoulders. Tortuous aorta. IMPRESSION: Appliances appear in  satisfactory position. Mild cardiac enlargement. No focal airspace disease or consolidation in the lungs. Electronically Signed   By: Lucienne Capers M.D.   On: 10/13/2016 01:23   Dg Chest Port 1 View  Result Date: 10/12/2016 CLINICAL DATA:  Weakness for 2 weeks. Shortness of breath and wheezing tonight. EXAM: PORTABLE CHEST 1 VIEW COMPARISON:  07/11/2016 FINDINGS: Cardiac enlargement without vascular congestion or edema. No focal consolidation in the lungs. No blunting of costophrenic angles. No pneumothorax. Degenerative changes in the spine and shoulders. IMPRESSION: Cardiac enlargement.  No evidence of active pulmonary disease. Electronically Signed   By: Lucienne Capers M.D.   On: 10/12/2016 23:55     STUDIES:  CT soft tissue neck 11/24>>  CULTURES: MRSA 11/24 > Trache 11/24 > Blood 11/24 >   ANTIBIOTICS: Unasyn >   SIGNIFICANT EVENTS: 11/24 intubated, admitted to ICU.  LINES/TUBES: 11/23 ETT>  DISCUSSION: 34F, known to have chronic kidney  disease not on dialysis, known to have chronic systolic and diastolic congestive heart failure, presenting with 2 week history of shortness of breath, worsening dysphagia. ABG with hypercapnia. On intubation, there was note of supraglottic mass.  ASSESSMENT / PLAN:  PULMONARY A: Acute hypercarbic respiratory failure Supraglottic mass  P:   Full ventilator support Follow ABG CXR in the morning Vap bundle Consult ENT in the morning  CARDIOVASCULAR A:  NSTEMI  P:  Telemetry monitoring Trend troponin Continue home aspirin Heparin infusion per pharmacy Continue home captopril, pravastatin Holding home Lasix Cardiology consult in the morning  RENAL A:   Chronic kidney disease-creatinine about at baseline  P:   Gentle IVF resuscitation Follow BMP  GASTROINTESTINAL A:   Mild protein calorie malnutrition Weight loss  P:   Nothing by mouth for tonight Pepcid for SUP  HEMATOLOGIC A:   Supraglottic mass  P:  CT soft tissues of the neck  To better characterize lesion ENT consult for likely biopsy Follow CBC Heparin infusion per pharmacy in setting   NSTEMI  INFECTIOUS A:   leukocytosis P:   Defer antibiotics for now Trend WBC and fever curve  ENDOCRINE A:   Diabetes  P:   CBG monitoring and sliding scale insulin Holding glimepiride  NEUROLOGIC A:   Acute metabolic encephalopathy P:   RASS goal: -1 to -2 Fentanyl infusion, when necessary Versed Holding nortriptyline   FAMILY  - Updates:   - Inter-disciplinary family meet or Palliative Care meeting due by:  11/30   Georgann Housekeeper, AGACNP-BC [place] [facility] Pager [phone] or [phone]  10/13/2016 2:49 AM   ATTENDING NOTE / ATTESTATION NOTE :   I have discussed the case with the resident/APP  Georgann Housekeeper.   I agree with the resident/APP's  history, physical examination, assessment, and plans.    I have edited the above note and modified it according to our  agreed history, physical examination, assessment and plan.   Briefly, patient known to have chronic kidney disease not on hemodialysis, known to have chronic systolic and diastolic congestive heart failure (concern for infiltrative CMP/?amyloidosis), comes in with worsening dyspnea accompanied by progressive dysphagia with poor by mouth intake and weight loss. ABG on admission showed hypercapnia. On intubation, she had a supraglottic mass making it difficult to intubate.   PE as above. VSS. Sedated, comfortable, not in distress. No obvious neck mass. ET tube in place. Good air entry, clear to auscultation. Cardiac exam showed good S1 and S2. Abdominal exam was benign. Chronic grade 2 edema. Warm extremities.  Labs  reviewed. Chest x-ray showed cardiomegaly, atelectasis at the bases. Repeat ABG showed hypercapnia. Creatinine slightly elevated from baseline. Mild leukocytosis.  Assessment/Plan : 1. Acute hypoxemic hypercapnic respiratory failure secondary to unable to protect the airway. Per ED documentation, patient has a supraglottic mass. Not sure if this is the main reason for her symptoms. Patient has possible "infiltrative CMP/ ? Amyloidosis". Chest x-ray shows cardiomegaly. Not sure if cardiac finding is related to supraglottic mass. Plan to get CT scan of the neck and chest. Patient will need ENT evaluation after CT scan is done. I extensively discussed this with her daughter-in-law, Rodena Piety. I mentioned to her that she has a neck mass which could be cancer but we are not sure. We'll need further workup and biopsy. 2. Concern for aspiration pneumonia. We'll panculture. Start Unasyn. Check lactic acid. Low threshold for discontinuing antibiotics depending on CT scan of the chest. Check PCT 3. CHF, EF 35-40 percent. Combined systolic and diastolic heart failure. She is not behaving like sepsis. She does not seem to be fluid deficient. We'll try to keep euvolemic. 4. Demand ischemia. Trend enzymes.  Heparin has been ordered. Cath done in early part of this year showed nonobstructive CAD. 4. AKI/CKD related to poor PO intake.  Has received 1L NS already. Will observe. Making urine. Hold off on home BP meds.  5. DM. CBG and sliding scale.    I have spent 30  minutes of critical care time with this patient today.  Family :Family updated at length today. Patient's daughter-in-law, Rodena Piety, was at bedside. We extensively discussed the plan. I mentioned to her the critical nature of her illness. I mentioned to her to let her children know that she is critically ill and they should come over from [place].   Monica Becton, MD 10/13/2016, 2:51 AM [facility] Pager [phone] After 3 pm or if no answer, call [phone]

## 2016-10-13 NOTE — Progress Notes (Signed)
Pt transported from [facility] to CT scan.  Pt remained stable throughout with no adverse reactions.

## 2016-10-13 NOTE — Progress Notes (Signed)
Pharmacy Antibiotic Note  Mandy Rhodes is a 75 y.o. female admitted on 10/12/2016 with pneumonia.  Pharmacy has been consulted for Unasyn dosing.  Plan: Unasyn 3gm iv q8hr  Height: 5\' 6"  (167.6 cm) Weight: 179 lb 7.3 oz (81.4 kg) IBW/kg (Calculated) : 59.3  Temp (24hrs), Avg:94.3 F (34.6 C), Min:90.9 F (32.7 C), Max:98.1 F (36.7 C)   Recent Labs Lab 10/12/16 2302 10/13/16 0310 10/13/16 0534  WBC 12.0*  --  10.2  CREATININE 1.84*  --   --   LATICACIDVEN  --  2.4* 1.8    Estimated Creatinine Clearance: 28.4 mL/min (by C-G formula based on SCr of 1.84 mg/dL (H)).    Allergies  Allergen Reactions  . Sulfa Antibiotics Swelling    Antimicrobials this admission: Unasyn 11/24 >>  Dose adjustments this admission: -  Microbiology results: pending  Thank you for allowing pharmacy to be a part of this patient's care.  Nani Skillern Crowford 10/13/2016 6:10 AM

## 2016-10-13 NOTE — Progress Notes (Signed)
[place] Progress Note Patient Name: Mandy Rhodes DOB: Sep 05, 1941 MRN: [id number]   Date of Service  10/13/2016  HPI/Events of Note  rn concerned patient neck is stiff  eICU Interventions  On camera patient neck is off pillow and she moves and is looking approrpaitely. Family member 4:38 PM relayed to RN that patient has "pins" in her neck -      Intervention Category Intermediate Interventions: Other:  Haifa Hatton 10/13/2016, 4:37 PM

## 2016-10-13 NOTE — Progress Notes (Signed)
ANTICOAGULATION CONSULT NOTE - Initial Consult  Pharmacy Consult for Heparin Indication: chest pain/ACS  Allergies  Allergen Reactions  . Sulfa Antibiotics Swelling    Patient Measurements: Height: 5\' 6"  (167.6 cm) Weight: 179 lb 7.3 oz (81.4 kg) IBW/kg (Calculated) : 59.3 Heparin Dosing Weight:   Vital Signs: Temp: 96.3 F (35.7 C) (11/24 0600) Temp Source: Core (Comment) (11/24 0600) BP: 81/42 (11/24 0600) Pulse Rate: 76 (11/24 0600)  Labs:  Recent Labs  10/12/16 2302 10/13/16 0058 10/13/16 0310 10/13/16 0534  HGB 12.3  --   --  10.6*  HCT 38.3  --   --  32.4*  PLT 179  --   --  179  APTT  --  32  --   --   LABPROT  --  15.5*  --   --   INR  --  1.22  --   --   CREATININE 1.84*  --   --   --   TROPONINI 0.53*  --  0.64*  --     Estimated Creatinine Clearance: 28.4 mL/min (by C-G formula based on SCr of 1.84 mg/dL (H)).   Medical History: Past Medical History:  Diagnosis Date  . Anemia   . Arthritis   . CHF (congestive heart failure) ([place])   . Chronic kidney disease    Renal Insuffiency, see [facility] once a year  . Diabetes mellitus    type 2  . GERD (gastroesophageal reflux disease)   . History of rotator cuff tear    Torn right rotator cuff.  . History of spinal stenosis   . Hypertension   . Neuropathy associated with endocrine disorder (HCC)     Medications:  Infusions:  . heparin 1,000 Units/hr (10/13/16 0134)    Assessment: Patient with + troponin and MD wants heparin per pharmacy for ACS.  No oral anticoagulants noted on med rec.   Baseline PTT/PT/INR okay for heparin start.  Goal of Therapy:  Heparin level 0.3-0.7 units/ml Monitor platelets by anticoagulation protocol: Yes   Plan:  Heparin 3000 units iv x1 Heparin drip at 1000 units/hr Daily CBC Next heparin level at [place], [place] Crowford 10/13/2016,6:11 AM

## 2016-10-13 NOTE — Progress Notes (Signed)
Pharmacy Antibiotic Note  Mandy Rhodes is a 75 y.o. female admitted on 10/12/2016 with SOB,weakness, weight loss.  On admit she was found to have respiratory failure currently intubated, elevated troponin being worked-up for ACS.  CXR also + LLL   pneumonia.   Pharmacy has been consulted for Unasyn dosing. PMH significant for CKD (baseline Scr ~ 1.8) and recent Z-pak completed in past 7 days as outpatient for PNA/bronchitis.  10/13/2016:  Afebrile  WBC wnl, LA normalized (2.4-->1.8)  CKD- at patient's baseline; CrCl ~ 25-38ml/min  Plan: Decrease Unasyn 3gm IV q12h for CrCl<13ml/min Monitor renal function and cx data    Height: 5\' 6"  (167.6 cm) Weight: 179 lb 7.3 oz (81.4 kg) IBW/kg (Calculated) : 59.3  Temp (24hrs), Avg:94.4 F (34.7 C), Min:90.9 F (32.7 C), Max:98.1 F (36.7 C)   Recent Labs Lab 10/12/16 2302 10/13/16 0310 10/13/16 0534  WBC 12.0*  --  10.2  CREATININE 1.84*  --  1.85*  LATICACIDVEN  --  2.4* 1.8    Estimated Creatinine Clearance: 28.2 mL/min (by C-G formula based on SCr of 1.85 mg/dL (H)).    Allergies  Allergen Reactions  . Sulfa Antibiotics Swelling    Antimicrobials this admission: Unasyn 11/24 >>  Dose adjustments this admission: -  Microbiology results: pending  Thank you for allowing pharmacy to be a part of this patient's care.  Netta Cedars, PharmD, BCPS Pager: [phone] 10/13/2016 7:14 AM

## 2016-10-13 NOTE — ED Notes (Signed)
Mandy Rhodes from lab advised of troponin value of 0.53.  Informed MD and RN.

## 2016-10-13 NOTE — Progress Notes (Signed)
[place] Progress Note Patient Name: Mandy Rhodes DOB: [birth year] MRN: [id number]   Date of Service  10/13/2016  HPI/Events of Note  Persistent hypotension verified by aline with current BP 91/45 (57).  Goal MAP of greater than 60.  Has no CVL but PIVs  eICU Interventions  Plan: 500 cc NS bolus for BP support.   Consider pressors      Intervention Category Intermediate Interventions: Hypotension - evaluation and management  Mayra Brahm 10/13/2016, 11:48 PM

## 2016-10-13 NOTE — Procedures (Signed)
Arterial Catheter Insertion Procedure Note Mandy Rhodes [id number] 1941/03/02  Procedure: Insertion of Arterial Catheter  Indications: Blood pressure monitoring  Procedure Details Consent: Risks of procedure as well as the alternatives and risks of each were explained to the (patient/caregiver).  Consent for procedure obtained. Time Out: Verified patient identification, verified procedure, site/side was marked, verified correct patient position, special equipment/implants available, medications/allergies/relevent history reviewed, required imaging and test results available.  Performed  Maximum sterile technique was used including antiseptics, cap, gloves, gown, hand hygiene, mask and sheet. Skin prep: Chlorhexidine; local anesthetic administered 20 gauge catheter was inserted into left radial artery using the Seldinger technique.  Evaluation Blood flow good; BP tracing good. Complications: No apparent complications.   Mandy Rhodes 10/13/2016

## 2016-10-13 NOTE — Progress Notes (Signed)
Pt transported from [facility] to 1229.   Trip made without incident.  Pt remained stable throughout trip.

## 2016-10-13 NOTE — Progress Notes (Signed)
  Echocardiogram 2D Echocardiogram has been performed.  Mandy Rhodes 10/13/2016, 2:28 PM

## 2016-10-13 NOTE — Consult Note (Signed)
Mandy Rhodes, Mandy Rhodes 75 y.o., female [id number]     Chief Complaint: laryngeal mass  HPI: 75 yo bf, admitted yesterday with hypercarbic resp failure.  On intubation in ED, laryngeal mass noted.  Intubation difficult due to mass and neck kyphosis.  CT scan without contrast unrevealing as far as nodes or primary laryngeal mass.  Pt with hx smoking. Hx CRF, DM.  30 lb wt loss recently.  Difficulty swallowing.  No prior hx cancer.   Critical care medicine working up pneumonia, CHF, possible heart attack. On anticoagulation.  Dr. Ashok Cordia feels resp failure likely secondary to pneumonia, possible CHF, possible OSA. Uncertain as to possible obstructing tumor .  Daughter in law describes recent stridor during telephone conversations.  No reported hemoptysis.    PMH: Past Medical History:  Diagnosis Date  . Anemia   . Arthritis   . CHF (congestive heart failure) ([place])   . Chronic kidney disease    Renal Insuffiency, see [facility] once a year  . Diabetes mellitus    type 2  . GERD (gastroesophageal reflux disease)   . History of rotator cuff tear    Torn right rotator cuff.  . History of spinal stenosis   . Hypertension   . Neuropathy associated with endocrine disorder ([place])     Surg Hx: Past Surgical History:  Procedure Laterality Date  . ABDOMINAL HYSTERECTOMY  1971   Partial  . APPENDECTOMY    . BACK SURGERY  2009   Disectomy  . CARDIAC CATHETERIZATION N/A 03/22/2016   Procedure: Left Heart Cath and Coronary Angiography;  Surgeon: Peter M Martinique, MD;  Location: [facility];  Service: Cardiovascular;  Laterality: N/A;  . CHOLECYSTECTOMY  1969   Status post   . CYST REMOVAL TRUNK Right 12/03/2014   Procedure: EXCISION OF RIGHT BACK CYST ;  Surgeon: Coralie Keens, MD;  Location: [facility];  Service: General;  Laterality: Right;  . FOOT SURGERY Bilateral    bone removed  . NEUROPLASTY / TRANSPOSITION MEDIAN NERVE AT CARPAL TUNNEL Bilateral    Hx  Left carpal tunnel repair     FHx:   Family History  Problem Relation Age of Onset  . Diabetes type II Mother   . Hypertension Father   . Diabetes type II Sister   . Ovarian cancer Sister   . Diabetes type II Other   . Stroke Neg Hx   . CAD Neg Hx   . Heart failure Neg Hx    SocHx:  reports that she has quit smoking. She has never used smokeless tobacco. She reports that she does not drink alcohol or use drugs.  ALLERGIES:  Allergies  Allergen Reactions  . Sulfa Antibiotics Swelling    Medications Prior to Admission  Medication Sig Dispense Refill  . aspirin EC 81 MG EC tablet Take 1 tablet (81 mg total) by mouth daily. 30 tablet 0  . Calcium Carbonate-Vit D-Min (CALCIUM 1200 PO) Take 600 mg by mouth daily.     . captopril (CAPOTEN) 12.5 MG tablet Take 1 tablet (12.5 mg total) by mouth 2 (two) times daily. 180 tablet 3  . cholecalciferol (VITAMIN D) 1000 UNITS tablet Take 1,000 Units by mouth daily. Reported on 03/27/2016    . docusate sodium (COLACE) 100 MG capsule Take 100 mg by mouth daily as needed for mild constipation.    . ferrous sulfate 325 (65 FE) MG EC tablet Take 325 mg by mouth daily.     . furosemide (LASIX) 40 MG  tablet Take 1 tablet (40 mg total) by mouth daily. OK TO TAKE ONE ADDITIONAL TABLET AS NEEDED FOR WEIGHT GAIN (Patient taking differently: Take 40 mg by mouth every other day. OK TO TAKE ONE ADDITIONAL TABLET AS NEEDED FOR WEIGHT GAIN) 100 tablet 3  . glimepiride (AMARYL) 1 MG tablet Take 1 mg by mouth daily with breakfast.     . Multiple Vitamin (MULTIVITAMIN) tablet Take 1 tablet by mouth daily.      . nortriptyline (PAMELOR) 10 MG capsule Take 10 mg by mouth at bedtime.    Marland Kitchen omeprazole (PRILOSEC) 40 MG capsule Take 40 mg by mouth daily.      . potassium chloride SA (K-DUR,KLOR-CON) 20 MEQ tablet Take 1 tablet (20 mEq total) by mouth daily. May take an extra tablet if extra furosemide is taken (Patient taking differently: Take 20 mEq by mouth every other day. May take an extra tablet  if extra furosemide is taken) 30 tablet 3  . pravastatin (PRAVACHOL) 40 MG tablet Take 40 mg by mouth daily.      Marland Kitchen PRESCRIPTION MEDICATION 1 drop every 3 (three) months. Eye drops to be used the day before, of , and after eye injections      Results for orders placed or performed during the hospital encounter of 10/12/16 (from the past 48 hour(s))  Comprehensive metabolic panel     Status: Abnormal   Collection Time: 10/12/16 11:02 PM  Result Value Ref Range   Sodium 145 135 - 145 mmol/L   Potassium 3.7 3.5 - 5.1 mmol/L   Chloride 109 101 - 111 mmol/L   CO2 26 22 - 32 mmol/L   Glucose, Bld 137 (H) 65 - 99 mg/dL   BUN 60 (H) 6 - 20 mg/dL   Creatinine, Ser 1.84 (H) 0.44 - 1.00 mg/dL   Calcium 10.5 (H) 8.9 - 10.3 mg/dL   Total Protein 8.0 6.5 - 8.1 g/dL   Albumin 3.3 (L) 3.5 - 5.0 g/dL   AST 33 15 - 41 U/L   ALT 30 14 - 54 U/L   Alkaline Phosphatase 97 38 - 126 U/L   Total Bilirubin 1.0 0.3 - 1.2 mg/dL   GFR calc non Af Amer 26 (L) >60 mL/min   GFR calc Af Amer 30 (L) >60 mL/min    Comment: (NOTE) The eGFR has been calculated using the CKD EPI equation. This calculation has not been validated in all clinical situations. eGFR's persistently <60 mL/min signify possible Chronic Kidney Disease.    Anion gap 10 5 - 15  Ethanol     Status: None   Collection Time: 10/12/16 11:02 PM  Result Value Ref Range   Alcohol, Ethyl (B) <5 <5 mg/dL    Comment:        LOWEST DETECTABLE LIMIT FOR SERUM ALCOHOL IS 5 mg/dL FOR MEDICAL PURPOSES ONLY   CBC with Differential     Status: Abnormal   Collection Time: 10/12/16 11:02 PM  Result Value Ref Range   WBC 12.0 (H) 4.0 - 10.5 K/uL   RBC 4.29 3.87 - 5.11 MIL/uL   Hemoglobin 12.3 12.0 - 15.0 g/dL   HCT 38.3 36.0 - 46.0 %   MCV 89.3 78.0 - 100.0 fL   MCH 28.7 26.0 - 34.0 pg   MCHC 32.1 30.0 - 36.0 g/dL   RDW 16.3 (H) 11.5 - 15.5 %   Platelets 179 150 - 400 K/uL   Neutrophils Relative % 86 %   Neutro Abs 10.4 (H) 1.7 -  7.7 K/uL    Lymphocytes Relative 8 %   Lymphs Abs 1.0 0.7 - 4.0 K/uL   Monocytes Relative 6 %   Monocytes Absolute 0.7 0.1 - 1.0 K/uL   Eosinophils Relative 0 %   Eosinophils Absolute 0.0 0.0 - 0.7 K/uL   Basophils Relative 0 %   Basophils Absolute 0.0 0.0 - 0.1 K/uL  Troponin I     Status: Abnormal   Collection Time: 10/12/16 11:02 PM  Result Value Ref Range   Troponin I 0.53 (HH) <0.03 ng/mL    Comment: RESULT REPEATED AND VERIFIED CRITICAL RESULT CALLED TO, READ BACK BY AND VERIFIED WITH: Les Pou RN @ 0011 ON 10/13/16 BY C DAVIS    Brain natriuretic peptide     Status: Abnormal   Collection Time: 10/12/16 11:02 PM  Result Value Ref Range   B Natriuretic Peptide 313.4 (H) 0.0 - 100.0 pg/mL  CBG monitoring, ED     Status: Abnormal   Collection Time: 10/12/16 11:05 PM  Result Value Ref Range   Glucose-Capillary 120 (H) 65 - 99 mg/dL  Blood gas, arterial     Status: Abnormal   Collection Time: 10/12/16 11:24 PM  Result Value Ref Range   O2 Content 4.0 L/min   Delivery systems NASAL CANNULA    pH, Arterial 7.225 (L) 7.350 - 7.450   pCO2 arterial 78.0 (HH) 32.0 - 48.0 mmHg    Comment: CRITICAL RESULT CALLED TO, READ BACK BY AND VERIFIED WITH: Delora Fuel, MD AT 9675 ON 91/63/8466 BY DANIEL JONES, RRT, RCP    pO2, Arterial 230 (H) 83.0 - 108.0 mmHg   Bicarbonate 31.2 (H) 20.0 - 28.0 mmol/L   Acid-Base Excess 2.0 0.0 - 2.0 mmol/L   O2 Saturation 98.6 %   Patient temperature 98.1    Collection site RIGHT RADIAL    Drawn by [id number]    Sample type ARTERIAL DRAW    Allens test (pass/fail) PASS PASS  APTT     Status: None   Collection Time: 10/13/16 12:58 AM  Result Value Ref Range   aPTT 32 24 - 36 seconds  Protime-INR     Status: Abnormal   Collection Time: 10/13/16 12:58 AM  Result Value Ref Range   Prothrombin Time 15.5 (H) 11.4 - 15.2 seconds   INR 1.22   Blood gas, arterial     Status: Abnormal   Collection Time: 10/13/16  2:02 AM  Result Value Ref Range   FIO2 60.00     Delivery systems VENTILATOR    Mode PRESSURE REGULATED VOLUME CONTROL    VT 470 mL   LHR 20 resp/min   Peep/cpap 5.0 cm H20   pH, Arterial 7.539 (H) 7.350 - 7.450   pCO2 arterial 29.8 (L) 32.0 - 48.0 mmHg   pO2, Arterial 51.1 (L) 83.0 - 108.0 mmHg   Bicarbonate 26.4 20.0 - 28.0 mmol/L   Acid-Base Excess 3.4 (H) 0.0 - 2.0 mmol/L   O2 Saturation 93.0 %   Patient temperature 33.4    Collection site RIGHT RADIAL    Drawn by [id number]    Sample type ARTERIAL DRAW    Allens test (pass/fail) PASS PASS  Lactic acid, plasma     Status: Abnormal   Collection Time: 10/13/16  3:10 AM  Result Value Ref Range   Lactic Acid, Venous 2.4 (HH) 0.5 - 1.9 mmol/L    Comment: CRITICAL RESULT CALLED TO, READ BACK BY AND VERIFIED WITH: Delton Prairie RN @ 0355 ON 10/13/16 BY C DAVIS  Troponin I     Status: Abnormal   Collection Time: 10/13/16  3:10 AM  Result Value Ref Range   Troponin I 0.64 (HH) <0.03 ng/mL    Comment: CRITICAL VALUE NOTED.  VALUE IS CONSISTENT WITH PREVIOUSLY REPORTED AND CALLED VALUE.  Procalcitonin - Baseline     Status: None   Collection Time: 10/13/16  3:10 AM  Result Value Ref Range   Procalcitonin <0.10 ng/mL    Comment:        Interpretation: PCT (Procalcitonin) <= 0.5 ng/mL: Systemic infection (sepsis) is not likely. Local bacterial infection is possible. (NOTE)         ICU PCT Algorithm               Non ICU PCT Algorithm    ----------------------------     ------------------------------         PCT < 0.25 ng/mL                 PCT < 0.1 ng/mL     Stopping of antibiotics            Stopping of antibiotics       strongly encouraged.               strongly encouraged.    ----------------------------     ------------------------------       PCT level decrease by               PCT < 0.25 ng/mL       >= 80% from peak PCT       OR PCT 0.25 - 0.5 ng/mL          Stopping of antibiotics                                             encouraged.     Stopping of antibiotics            encouraged.    ----------------------------     ------------------------------       PCT level decrease by              PCT >= 0.25 ng/mL       < 80% from peak PCT        AND PCT >= 0.5 ng/mL            Continuin g antibiotics                                              encouraged.       Continuing antibiotics            encouraged.    ----------------------------     ------------------------------     PCT level increase compared          PCT > 0.5 ng/mL         with peak PCT AND          PCT >= 0.5 ng/mL             Escalation of antibiotics  strongly encouraged.      Escalation of antibiotics        strongly encouraged.   Blood gas, arterial     Status: Abnormal   Collection Time: 10/13/16  4:43 AM  Result Value Ref Range   FIO2 40.00    Delivery systems VENTILATOR    Mode PRESSURE REGULATED VOLUME CONTROL    VT 470 mL   LHR 14 resp/min   Peep/cpap 5.0 cm H20   pH, Arterial 7.509 (H) 7.350 - 7.450   pCO2 arterial 33.5 32.0 - 48.0 mmHg   pO2, Arterial 53.4 (L) 83.0 - 108.0 mmHg   Bicarbonate 26.8 20.0 - 28.0 mmol/L   Acid-Base Excess 3.9 (H) 0.0 - 2.0 mmol/L   O2 Saturation 90.3 %   Patient temperature 36.0    Collection site RIGHT RADIAL    Drawn by [id number]    Sample type ARTERIAL DRAW    Allens test (pass/fail) PASS PASS  Urinalysis, Routine w reflex microscopic (not at [facility])     Status: Abnormal   Collection Time: 10/13/16  5:03 AM  Result Value Ref Range   Color, Urine AMBER (A) YELLOW    Comment: BIOCHEMICALS MAY BE AFFECTED BY COLOR   APPearance CLOUDY (A) CLEAR   Specific Gravity, Urine 1.023 1.005 - 1.030   pH 5.0 5.0 - 8.0   Glucose, UA NEGATIVE NEGATIVE mg/dL   Hgb urine dipstick NEGATIVE NEGATIVE   Bilirubin Urine MODERATE (A) NEGATIVE   Ketones, ur NEGATIVE NEGATIVE mg/dL   Protein, ur NEGATIVE NEGATIVE mg/dL   Nitrite NEGATIVE NEGATIVE   Leukocytes, UA NEGATIVE NEGATIVE    Comment: MICROSCOPIC NOT DONE ON URINES  WITH NEGATIVE PROTEIN, BLOOD, LEUKOCYTES, NITRITE, OR GLUCOSE <1000 mg/dL.  Strep pneumoniae urinary antigen  (not at [facility])     Status: None   Collection Time: 10/13/16  5:03 AM  Result Value Ref Range   Strep Pneumo Urinary Antigen NEGATIVE NEGATIVE    Comment:        Infection due to S. pneumoniae cannot be absolutely ruled out since the antigen present may be below the detection limit of the test.   MRSA PCR Screening     Status: None   Collection Time: 10/13/16  5:03 AM  Result Value Ref Range   MRSA by PCR NEGATIVE NEGATIVE    Comment:        The GeneXpert MRSA Assay (FDA approved for NASAL specimens only), is one component of a comprehensive MRSA colonization surveillance program. It is not intended to diagnose MRSA infection nor to guide or monitor treatment for MRSA infections.   Urine rapid drug screen (hosp performed)     Status: None   Collection Time: 10/13/16  5:03 AM  Result Value Ref Range   Opiates NONE DETECTED NONE DETECTED   Cocaine NONE DETECTED NONE DETECTED   Benzodiazepines NONE DETECTED NONE DETECTED   Amphetamines NONE DETECTED NONE DETECTED   Tetrahydrocannabinol NONE DETECTED NONE DETECTED   Barbiturates NONE DETECTED NONE DETECTED    Comment:        DRUG SCREEN FOR MEDICAL PURPOSES ONLY.  IF CONFIRMATION IS NEEDED FOR ANY PURPOSE, NOTIFY LAB WITHIN 5 DAYS.        LOWEST DETECTABLE LIMITS FOR URINE DRUG SCREEN Drug Class       Cutoff (ng/mL) Amphetamine      1000 Barbiturate      200 Benzodiazepine   111 Tricyclics       735 Opiates  300 Cocaine          300 THC              50   CBC     Status: Abnormal   Collection Time: 10/13/16  5:34 AM  Result Value Ref Range   WBC 10.2 4.0 - 10.5 K/uL   RBC 3.69 (L) 3.87 - 5.11 MIL/uL   Hemoglobin 10.6 (L) 12.0 - 15.0 g/dL   HCT 32.4 (L) 36.0 - 46.0 %   MCV 87.8 78.0 - 100.0 fL   MCH 28.7 26.0 - 34.0 pg   MCHC 32.7 30.0 - 36.0 g/dL   RDW 16.1 (H) 11.5 - 15.5 %   Platelets 179 150  - 400 K/uL  Lactic acid, plasma     Status: None   Collection Time: 10/13/16  5:34 AM  Result Value Ref Range   Lactic Acid, Venous 1.8 0.5 - 1.9 mmol/L  Basic metabolic panel     Status: Abnormal   Collection Time: 10/13/16  5:34 AM  Result Value Ref Range   Sodium 148 (H) 135 - 145 mmol/L   Potassium 3.0 (L) 3.5 - 5.1 mmol/L    Comment: DELTA CHECK NOTED   Chloride 111 101 - 111 mmol/L   CO2 28 22 - 32 mmol/L   Glucose, Bld 122 (H) 65 - 99 mg/dL   BUN 58 (H) 6 - 20 mg/dL   Creatinine, Ser 1.85 (H) 0.44 - 1.00 mg/dL   Calcium 9.5 8.9 - 10.3 mg/dL   GFR calc non Af Amer 26 (L) >60 mL/min   GFR calc Af Amer 30 (L) >60 mL/min    Comment: (NOTE) The eGFR has been calculated using the CKD EPI equation. This calculation has not been validated in all clinical situations. eGFR's persistently <60 mL/min signify possible Chronic Kidney Disease.    Anion gap 9 5 - 15  Magnesium     Status: None   Collection Time: 10/13/16  5:34 AM  Result Value Ref Range   Magnesium 1.8 1.7 - 2.4 mg/dL  Phosphorus     Status: None   Collection Time: 10/13/16  5:34 AM  Result Value Ref Range   Phosphorus 2.6 2.5 - 4.6 mg/dL  Ethanol     Status: None   Collection Time: 10/13/16  5:34 AM  Result Value Ref Range   Alcohol, Ethyl (B) <5 <5 mg/dL    Comment:        LOWEST DETECTABLE LIMIT FOR SERUM ALCOHOL IS 5 mg/dL FOR MEDICAL PURPOSES ONLY   Glucose, capillary     Status: None   Collection Time: 10/13/16  5:46 AM  Result Value Ref Range   Glucose-Capillary 94 65 - 99 mg/dL  Glucose, capillary     Status: Abnormal   Collection Time: 10/13/16  7:15 AM  Result Value Ref Range   Glucose-Capillary 104 (H) 65 - 99 mg/dL  Heparin level (unfractionated)     Status: None   Collection Time: 10/13/16 10:33 AM  Result Value Ref Range   Heparin Unfractionated 0.55 0.30 - 0.70 IU/mL    Comment:        IF HEPARIN RESULTS ARE BELOW EXPECTED VALUES, AND PATIENT DOSAGE HAS BEEN CONFIRMED, SUGGEST FOLLOW  UP TESTING OF ANTITHROMBIN III LEVELS.   Troponin I     Status: Abnormal   Collection Time: 10/13/16 10:33 AM  Result Value Ref Range   Troponin I 0.87 (HH) <0.03 ng/mL    Comment: CRITICAL VALUE NOTED.  VALUE  IS CONSISTENT WITH PREVIOUSLY REPORTED AND CALLED VALUE.  Glucose, capillary     Status: None   Collection Time: 10/13/16 11:14 AM  Result Value Ref Range   Glucose-Capillary 85 65 - 99 mg/dL   Dg Abd 1 View  Result Date: 10/13/2016 CLINICAL DATA:  Enteric tube placement. EXAM: ABDOMEN - 1 VIEW COMPARISON:  None. FINDINGS: Normal bowel gas pattern. Enteric tube tip projects over the mid stomach. Multilevel degenerative changes of the lumbar spine. Moderate bilateral hip osteoarthrosis. IMPRESSION: Enteric tube tip projects over mid stomach. Electronically Signed   By: Kristine Garbe M.D.   On: 10/13/2016 05:30   Ct Head Wo Contrast  Result Date: 10/13/2016 CLINICAL DATA:  Unresponsive patient. Patient complained of breath and generalized weakness for 2 weeks. EXAM: CT HEAD WITHOUT CONTRAST TECHNIQUE: Contiguous axial images were obtained from the base of the skull through the vertex without intravenous contrast. COMPARISON:  None. FINDINGS: Brain: No evidence of acute infarction, hemorrhage, hydrocephalus, extra-axial collection or mass lesion/mass effect. There is moderate brain parenchymal volume loss and symmetric bilateral microangiopathy. Vascular: Atherosclerotic disease at the skullbase. Skull: Normal. Negative for fracture or focal lesion. Sinuses/Orbits: No acute finding. Other: None. IMPRESSION: No acute intracranial abnormality. Moderate brain parenchymal atrophy and chronic microvascular disease. Electronically Signed   By: Fidela Salisbury M.D.   On: 10/13/2016 12:35   Ct Soft Tissue Neck Wo Contrast  Addendum Date: 10/13/2016   ADDENDUM REPORT: 10/13/2016 04:53 ADDENDUM: These results were called by telephone at the time of interpretation on 10/13/2016 at  4:53 am to Dr. Jimmy Footman, who verbally acknowledged these results. Electronically Signed   By: Kristine Garbe M.D.   On: 10/13/2016 04:53   Result Date: 10/13/2016 CLINICAL DATA:  75 y/o F; shortness of breath and generalized weakness for 2 weeks. Evaluation of supraglottic mass seen on intubation. EXAM: CT NECK AND CHEST WITHOUT CONTRAST COMPARISON:  07/15/2015 CT of cervical spine. FINDINGS: CT NECK FINDINGS Pharynx and larynx: There is fluid and debris throughout the oropharynx, hypopharynx, and supraglottic airway due to intubation and nasoenteric tube. Addition, the region is obscured by artifact from cervical fusion hardware and the endotracheal tube. No soft tissue mass can be distinguished. Laryngeal cartilage appears intact. Subglottic airway is patent. Salivary glands: No inflammation, mass, or stone. Thyroid: Normal. Lymph nodes: None enlarged or abnormal density. Vascular: Calcific atherosclerosis of bilateral carotid bifurcations. Limited intracranial: Negative. Visualized orbits: Not within the field of view Mastoids and visualized paranasal sinuses: Normally pneumatized mastoid air cells and visualized sinuses. Skeleton: Anterior cervical fusion with plate and screws from C3 through C6. Multilevel uncovertebral and facet hypertrophy with severe bony foraminal narrowing through the levels of fusion 1. C6-7 disc osteophyte complex with moderate to severe canal stenosis. C2-3 disc osteophyte complex with at least moderate canal stenosis. No acute osseous abnormality is identified. Flowing anterior ossification of upper thoracic spine may represent DISH. CT CHEST FINDINGS Cardiovascular: Mild cardiomegaly. No pericardial effusion. Moderate coronary artery calcification. Normal caliber thoracic aorta. Borderline enlarged main pulmonary artery. Mediastinum/Nodes: No enlarged mediastinal or axillary lymph nodes. Thyroid gland, trachea, and esophagus demonstrate no significant findings.  Endotracheal tube is less than 1 cm from the carina. Lungs/Pleura: There is patchy nodular consolidation within the bilateral upper and right lower lobe and a dense consolidation within the left lower lobe with air bronchograms. Small left pleural effusion. Upper Abdomen: Enteric tube tip below the field of view in the stomach. No acute process. Musculoskeletal: Flowing anterior ossification of vertebral bodies consistent  with DISH. No acute osseous abnormality. IMPRESSION: 1. Fluid and debris throughout the oropharynx, hypopharynx, and supraglottic airway from intubation. Suboptimal evaluation of the mucosa due to airway opacification and lack of contrast. Follow-up contrast examination after extubation and/or direct visualization recommended for evaluation of the supraglottic mass. 2. No cervical lymphadenopathy identified. 3. Left lower lobe consolidation and nodular opacities throughout the lungs compatible with pneumonia. Small left pleural effusion. 4. Endotracheal tube 1 cm from carina, retraction recommended. 5. Enteric tube tip below the field of view in the stomach. 6. Moderate coronary artery calcifications. 7. Borderline main pulmonary artery may represent pulmonary artery hypertension. Electronically Signed: By: Kristine Garbe M.D. On: 10/13/2016 04:36   Ct Chest Wo Contrast  Addendum Date: 10/13/2016   ADDENDUM REPORT: 10/13/2016 04:53 ADDENDUM: These results were called by telephone at the time of interpretation on 10/13/2016 at 4:53 am to Dr. Jimmy Footman, who verbally acknowledged these results. Electronically Signed   By: Kristine Garbe M.D.   On: 10/13/2016 04:53   Result Date: 10/13/2016 CLINICAL DATA:  75 y/o F; shortness of breath and generalized weakness for 2 weeks. Evaluation of supraglottic mass seen on intubation. EXAM: CT NECK AND CHEST WITHOUT CONTRAST COMPARISON:  07/15/2015 CT of cervical spine. FINDINGS: CT NECK FINDINGS Pharynx and larynx: There is fluid and  debris throughout the oropharynx, hypopharynx, and supraglottic airway due to intubation and nasoenteric tube. Addition, the region is obscured by artifact from cervical fusion hardware and the endotracheal tube. No soft tissue mass can be distinguished. Laryngeal cartilage appears intact. Subglottic airway is patent. Salivary glands: No inflammation, mass, or stone. Thyroid: Normal. Lymph nodes: None enlarged or abnormal density. Vascular: Calcific atherosclerosis of bilateral carotid bifurcations. Limited intracranial: Negative. Visualized orbits: Not within the field of view Mastoids and visualized paranasal sinuses: Normally pneumatized mastoid air cells and visualized sinuses. Skeleton: Anterior cervical fusion with plate and screws from C3 through C6. Multilevel uncovertebral and facet hypertrophy with severe bony foraminal narrowing through the levels of fusion 1. C6-7 disc osteophyte complex with moderate to severe canal stenosis. C2-3 disc osteophyte complex with at least moderate canal stenosis. No acute osseous abnormality is identified. Flowing anterior ossification of upper thoracic spine may represent DISH. CT CHEST FINDINGS Cardiovascular: Mild cardiomegaly. No pericardial effusion. Moderate coronary artery calcification. Normal caliber thoracic aorta. Borderline enlarged main pulmonary artery. Mediastinum/Nodes: No enlarged mediastinal or axillary lymph nodes. Thyroid gland, trachea, and esophagus demonstrate no significant findings. Endotracheal tube is less than 1 cm from the carina. Lungs/Pleura: There is patchy nodular consolidation within the bilateral upper and right lower lobe and a dense consolidation within the left lower lobe with air bronchograms. Small left pleural effusion. Upper Abdomen: Enteric tube tip below the field of view in the stomach. No acute process. Musculoskeletal: Flowing anterior ossification of vertebral bodies consistent with DISH. No acute osseous abnormality.  IMPRESSION: 1. Fluid and debris throughout the oropharynx, hypopharynx, and supraglottic airway from intubation. Suboptimal evaluation of the mucosa due to airway opacification and lack of contrast. Follow-up contrast examination after extubation and/or direct visualization recommended for evaluation of the supraglottic mass. 2. No cervical lymphadenopathy identified. 3. Left lower lobe consolidation and nodular opacities throughout the lungs compatible with pneumonia. Small left pleural effusion. 4. Endotracheal tube 1 cm from carina, retraction recommended. 5. Enteric tube tip below the field of view in the stomach. 6. Moderate coronary artery calcifications. 7. Borderline main pulmonary artery may represent pulmonary artery hypertension. Electronically Signed: By: Kristine Garbe M.D. On: 10/13/2016  04:36   Dg Chest Port 1 View  Result Date: 10/13/2016 CLINICAL DATA:  75 y/o  F; endotracheal tube placement. EXAM: PORTABLE CHEST 1 VIEW COMPARISON:  10/13/2016 chest CT. FINDINGS: Endotracheal tube is 2 cm from the carina. Enteric tube tip below the field of view in the abdomen. Left lower lobe consolidation. No pneumothorax. Stable cardiac silhouette. No acute osseous abnormality is evident. IMPRESSION: Endotracheal tube approximately 2 cm from carina. Left lower lobe consolidation. Electronically Signed   By: Kristine Garbe M.D.   On: 10/13/2016 05:29   Dg Chest Portable 1 View  Result Date: 10/13/2016 CLINICAL DATA:  Post intubation. EXAM: PORTABLE CHEST 1 VIEW COMPARISON:  10/12/2016 FINDINGS: Endotracheal tube is been placed with tip measuring 3.9 cm above the carina. An enteric tube was placed. Tip is off the field of view but below the left hemidiaphragm. Shallow inspiration. Borderline heart size without vascular congestion or edema. No focal consolidation in the lungs. Degenerative changes in the shoulders. Tortuous aorta. IMPRESSION: Appliances appear in satisfactory position.  Mild cardiac enlargement. No focal airspace disease or consolidation in the lungs. Electronically Signed   By: Lucienne Capers M.D.   On: 10/13/2016 01:23   Dg Chest Port 1 View  Result Date: 10/12/2016 CLINICAL DATA:  Weakness for 2 weeks. Shortness of breath and wheezing tonight. EXAM: PORTABLE CHEST 1 VIEW COMPARISON:  07/11/2016 FINDINGS: Cardiac enlargement without vascular congestion or edema. No focal consolidation in the lungs. No blunting of costophrenic angles. No pneumothorax. Degenerative changes in the spine and shoulders. IMPRESSION: Cardiac enlargement.  No evidence of active pulmonary disease. Electronically Signed   By: Lucienne Capers M.D.   On: 10/12/2016 23:55    ROS: could not obtain  Blood pressure (!) 96/49, pulse 82, temperature 99.9 F (37.7 C), resp. rate 18, height '5\' 6"'  (1.676 m), weight 81.4 kg (179 lb 7.3 oz), SpO2 100 %.  PHYSICAL EXAM: Overall appearance:  Obtunded.  Responds  Slightly but semi-purposefully to spoken voice.  Attempts to open eyes and open mouth.   Head:  NCAT Ears:  not examined Nose:  Not examined Oral Cavity:  OT, OG tube in place. Tongue appears nl.  No gross lesions in upper oropharynx.  Oral Pharynx/Hypopharynx/Larynx:  Could not examine Neuro:  did not fully examine Neck:  No adenopathy.  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  Hx smoking, recent wt loss and dysphagia.    Complex medical condition.  Plan:  Will need some time to define and stabilize medical conditions before she is a decent anesthesia candidate.  Will need direct laryngoscopy with biopsy for diagnosis and staging under anesthesia.  Does not seem likely she will need a trach for airway due to tumor obstruction, although daughter in law describes stridor when speaking with her by telephone. I don't think obstruction would be as likely to cause hypercarbia as the other factors.  She was previously a DNR pt.  Will  need to discuss with family to decide whether they would accept this if felt necessary in order to get her extubated.  Would need to be off heparin briefly for biopsy, longer if tracheostomy necessary.    Did discuss with daughter in law who has power of attorney.  She would like to talk with pt's sons and then perhaps have a group discussion with me.  We are still at a preliminary phase in terms of accurate diagnosis, so I will have to  speak in hypotheticals.    [name] [id number], 12:45 PM

## 2016-10-13 NOTE — Progress Notes (Addendum)
RN informed MD, Dr. Ashok Cordia, that pt has had 19mL of urine output for duration of shift. RN bladder scanned pt, bladder scan showed 0 volume in bladder. RN flushed foley catheter and had 35mL of urine return.MD ordered 1L bolus. Will continue to monitor.

## 2016-10-13 NOTE — Progress Notes (Signed)
MD, Dr. Ashok Cordia made aware of pt soft BPs, as well as low urine output. Will continue to monitor.

## 2016-10-13 NOTE — Progress Notes (Signed)
Pt rhythm changed from NSR to ST with a rate in the 120s sustained. E-Link RN made aware. Will continue to monitor.

## 2016-10-13 NOTE — Progress Notes (Signed)
1440. RN entered room to find patient stiffly holding head over pillow. Pt entire body was tense and left leg was intermittently twitching. Pt eyes closed. RN performed pupil assessment. PERRLA. RN tried to awaken patient, pt has been lethargic and non-arrousable (MD aware-CT of the head performed). After a few minutes of trying to arouse pt, pt awakened for a short period of time and looked around the room in confusion. RN performed random CBG check and CBG was 91. VS unchanged from earlier in the day. Pt still holding stiff posture. Pt legs closed tight together, arms are straight and tightly held towards midline. Neck and head lifted off of pillow, HOB at 30 degrees. RN consulted CCMD, awaiting call-back. Will continue to monitor.

## 2016-10-13 NOTE — Progress Notes (Signed)
PULMONARY / CRITICAL CARE MEDICINE   Name: Mandy Rhodes MRN: [id number] DOB: [birth year]    ADMISSION DATE:  10/12/2016 CONSULTATION DATE:  10/13/2016  REFERRING MD:  Dr. Roxanne Mins EDP  CHIEF COMPLAINT:  Shortness of breath  HISTORY OF PRESENT ILLNESS:  75 y.o. female with past medical history as below, which is significant for diabetes, CHF, chronic kidney disease, and hypertension. She presented to [facility] emergency department 10/12/2016 with complaints of shortness of breath and generalized weakness for about 2 weeks. She has additionally suffered a 30 pound weight loss over some recent period of time which is not entirely clear. Recently treated with antibiotics as an outpatient for suspected bronchitis vs pneumonia.Upon arrival to the emergency department she was on 4 L of supplemental O2 via nasal cannula and still had increased work of breathing.due to her somnolence ABG was ordered which showed  Hypercapnic respiratory failure and she was started on BiPAP. She was known to be a DO NOT RESUSCITATE, however,  Family has decided to proceed with intubation, which was done in the emergency department. During intubation a supraglottic mass was noted by emergency room provider which made intubation difficult. Laboratory evaluation significant forserum creatinine 1.8, calcium 10.5, troponin 0.53, WBC 12, glucose 137.PCCM asked to admit  SUBJECTIVE: No acute events since admission and intubation. Patient still not waking up.  REVIEW OF SYSTEMS:  Unable to obtain due to intubation and sedation.  VITAL SIGNS: BP (!) 98/43   Pulse 81   Temp 98.4 F (36.9 C)   Resp 19   Ht 5\' 6"  (1.676 m)   Wt 179 lb 7.3 oz (81.4 kg)   SpO2 100%   BMI 28.96 kg/m   HEMODYNAMICS:    VENTILATOR SETTINGS: Vent Mode: PRVC FiO2 (%):  [4 %-60 %] 40 % Set Rate:  [14 bmp-20 bmp] 17 bmp Vt Set:  [470 mL] 470 mL PEEP:  [5 cmH20] 5 cmH20 Plateau Pressure:  [17 cmH20-23 cmH20] 17 cmH20  INTAKE /  OUTPUT: I/O last 3 completed shifts: In: 694.3 [I.V.:44.3; IV Piggyback:650] Out: 300 [Urine:300]  PHYSICAL EXAMINATION: General:  Eyes closed. Daughter-in-law at bedside. No distress. Neuro:  Forcefully closes eyes. Moving all extremities spontaneously. Does not follow commands or open eyes on command. HEENT:  Endotracheal tube in place. No scleral icterus or injection. Cardiovascular:  Regular rate and rhythm. No edema. No appreciable JVD. Pulmonary: Symmetric chest wall rise on ventilator. Overall clear with auscultation. Abdomen:  Soft. Protuberant. Normal bowel sounds. Musculoskeletal:  No joint effusion or deformity appreciated. Skin:  Warm and dry. no rash on exposed skin.  LABS:  BMET  Recent Labs Lab 10/12/16 2302 10/13/16 0534  NA 145 148*  K 3.7 3.0*  CL 109 111  CO2 26 28  BUN 60* 58*  CREATININE 1.84* 1.85*  GLUCOSE 137* 122*    Electrolytes  Recent Labs Lab 10/12/16 2302 10/13/16 0534  CALCIUM 10.5* 9.5  MG  --  1.8  PHOS  --  2.6    CBC  Recent Labs Lab 10/12/16 2302 10/13/16 0534  WBC 12.0* 10.2  HGB 12.3 10.6*  HCT 38.3 32.4*  PLT 179 179    Coag's  Recent Labs Lab 10/13/16 0058  APTT 32  INR 1.22    Sepsis Markers  Recent Labs Lab 10/13/16 0310 10/13/16 0534  LATICACIDVEN 2.4* 1.8  PROCALCITON <0.10  --     ABG  Recent Labs Lab 10/12/16 2324 10/13/16 0202 10/13/16 0443  PHART 7.225* 7.539* 7.509*  PCO2ART 78.0* 29.8* 33.5  PO2ART 230* 51.1* 53.4*    Liver Enzymes  Recent Labs Lab 10/12/16 2302  AST 33  ALT 30  ALKPHOS 97  BILITOT 1.0  ALBUMIN 3.3*    Cardiac Enzymes  Recent Labs Lab 10/12/16 2302 10/13/16 0310  TROPONINI 0.53* 0.64*    Glucose  Recent Labs Lab 10/12/16 2305 10/13/16 0546 10/13/16 0715  GLUCAP 120* 94 104*    Imaging Dg Abd 1 View  Result Date: 10/13/2016 CLINICAL DATA:  Enteric tube placement. EXAM: ABDOMEN - 1 VIEW COMPARISON:  None. FINDINGS: Normal bowel gas  pattern. Enteric tube tip projects over the mid stomach. Multilevel degenerative changes of the lumbar spine. Moderate bilateral hip osteoarthrosis. IMPRESSION: Enteric tube tip projects over mid stomach. Electronically Signed   By: Kristine Garbe M.D.   On: 10/13/2016 05:30   Ct Soft Tissue Neck Wo Contrast  Addendum Date: 10/13/2016   ADDENDUM REPORT: 10/13/2016 04:53 ADDENDUM: These results were called by telephone at the time of interpretation on 10/13/2016 at 4:53 am to Dr. Jimmy Footman, who verbally acknowledged these results. Electronically Signed   By: Kristine Garbe M.D.   On: 10/13/2016 04:53   Result Date: 10/13/2016 CLINICAL DATA:  75 y/o F; shortness of breath and generalized weakness for 2 weeks. Evaluation of supraglottic mass seen on intubation. EXAM: CT NECK AND CHEST WITHOUT CONTRAST COMPARISON:  07/15/2015 CT of cervical spine. FINDINGS: CT NECK FINDINGS Pharynx and larynx: There is fluid and debris throughout the oropharynx, hypopharynx, and supraglottic airway due to intubation and nasoenteric tube. Addition, the region is obscured by artifact from cervical fusion hardware and the endotracheal tube. No soft tissue mass can be distinguished. Laryngeal cartilage appears intact. Subglottic airway is patent. Salivary glands: No inflammation, mass, or stone. Thyroid: Normal. Lymph nodes: None enlarged or abnormal density. Vascular: Calcific atherosclerosis of bilateral carotid bifurcations. Limited intracranial: Negative. Visualized orbits: Not within the field of view Mastoids and visualized paranasal sinuses: Normally pneumatized mastoid air cells and visualized sinuses. Skeleton: Anterior cervical fusion with plate and screws from C3 through C6. Multilevel uncovertebral and facet hypertrophy with severe bony foraminal narrowing through the levels of fusion 1. C6-7 disc osteophyte complex with moderate to severe canal stenosis. C2-3 disc osteophyte complex with at least  moderate canal stenosis. No acute osseous abnormality is identified. Flowing anterior ossification of upper thoracic spine may represent DISH. CT CHEST FINDINGS Cardiovascular: Mild cardiomegaly. No pericardial effusion. Moderate coronary artery calcification. Normal caliber thoracic aorta. Borderline enlarged main pulmonary artery. Mediastinum/Nodes: No enlarged mediastinal or axillary lymph nodes. Thyroid gland, trachea, and esophagus demonstrate no significant findings. Endotracheal tube is less than 1 cm from the carina. Lungs/Pleura: There is patchy nodular consolidation within the bilateral upper and right lower lobe and a dense consolidation within the left lower lobe with air bronchograms. Small left pleural effusion. Upper Abdomen: Enteric tube tip below the field of view in the stomach. No acute process. Musculoskeletal: Flowing anterior ossification of vertebral bodies consistent with DISH. No acute osseous abnormality. IMPRESSION: 1. Fluid and debris throughout the oropharynx, hypopharynx, and supraglottic airway from intubation. Suboptimal evaluation of the mucosa due to airway opacification and lack of contrast. Follow-up contrast examination after extubation and/or direct visualization recommended for evaluation of the supraglottic mass. 2. No cervical lymphadenopathy identified. 3. Left lower lobe consolidation and nodular opacities throughout the lungs compatible with pneumonia. Small left pleural effusion. 4. Endotracheal tube 1 cm from carina, retraction recommended. 5. Enteric tube tip below the field of  view in the stomach. 6. Moderate coronary artery calcifications. 7. Borderline main pulmonary artery may represent pulmonary artery hypertension. Electronically Signed: By: Kristine Garbe M.D. On: 10/13/2016 04:36   Ct Chest Wo Contrast  Addendum Date: 10/13/2016   ADDENDUM REPORT: 10/13/2016 04:53 ADDENDUM: These results were called by telephone at the time of interpretation on  10/13/2016 at 4:53 am to Dr. Jimmy Footman, who verbally acknowledged these results. Electronically Signed   By: Kristine Garbe M.D.   On: 10/13/2016 04:53   Result Date: 10/13/2016 CLINICAL DATA:  75 y/o F; shortness of breath and generalized weakness for 2 weeks. Evaluation of supraglottic mass seen on intubation. EXAM: CT NECK AND CHEST WITHOUT CONTRAST COMPARISON:  07/15/2015 CT of cervical spine. FINDINGS: CT NECK FINDINGS Pharynx and larynx: There is fluid and debris throughout the oropharynx, hypopharynx, and supraglottic airway due to intubation and nasoenteric tube. Addition, the region is obscured by artifact from cervical fusion hardware and the endotracheal tube. No soft tissue mass can be distinguished. Laryngeal cartilage appears intact. Subglottic airway is patent. Salivary glands: No inflammation, mass, or stone. Thyroid: Normal. Lymph nodes: None enlarged or abnormal density. Vascular: Calcific atherosclerosis of bilateral carotid bifurcations. Limited intracranial: Negative. Visualized orbits: Not within the field of view Mastoids and visualized paranasal sinuses: Normally pneumatized mastoid air cells and visualized sinuses. Skeleton: Anterior cervical fusion with plate and screws from C3 through C6. Multilevel uncovertebral and facet hypertrophy with severe bony foraminal narrowing through the levels of fusion 1. C6-7 disc osteophyte complex with moderate to severe canal stenosis. C2-3 disc osteophyte complex with at least moderate canal stenosis. No acute osseous abnormality is identified. Flowing anterior ossification of upper thoracic spine may represent DISH. CT CHEST FINDINGS Cardiovascular: Mild cardiomegaly. No pericardial effusion. Moderate coronary artery calcification. Normal caliber thoracic aorta. Borderline enlarged main pulmonary artery. Mediastinum/Nodes: No enlarged mediastinal or axillary lymph nodes. Thyroid gland, trachea, and esophagus demonstrate no significant  findings. Endotracheal tube is less than 1 cm from the carina. Lungs/Pleura: There is patchy nodular consolidation within the bilateral upper and right lower lobe and a dense consolidation within the left lower lobe with air bronchograms. Small left pleural effusion. Upper Abdomen: Enteric tube tip below the field of view in the stomach. No acute process. Musculoskeletal: Flowing anterior ossification of vertebral bodies consistent with DISH. No acute osseous abnormality. IMPRESSION: 1. Fluid and debris throughout the oropharynx, hypopharynx, and supraglottic airway from intubation. Suboptimal evaluation of the mucosa due to airway opacification and lack of contrast. Follow-up contrast examination after extubation and/or direct visualization recommended for evaluation of the supraglottic mass. 2. No cervical lymphadenopathy identified. 3. Left lower lobe consolidation and nodular opacities throughout the lungs compatible with pneumonia. Small left pleural effusion. 4. Endotracheal tube 1 cm from carina, retraction recommended. 5. Enteric tube tip below the field of view in the stomach. 6. Moderate coronary artery calcifications. 7. Borderline main pulmonary artery may represent pulmonary artery hypertension. Electronically Signed: By: Kristine Garbe M.D. On: 10/13/2016 04:36   Dg Chest Port 1 View  Result Date: 10/13/2016 CLINICAL DATA:  75 y/o  F; endotracheal tube placement. EXAM: PORTABLE CHEST 1 VIEW COMPARISON:  10/13/2016 chest CT. FINDINGS: Endotracheal tube is 2 cm from the carina. Enteric tube tip below the field of view in the abdomen. Left lower lobe consolidation. No pneumothorax. Stable cardiac silhouette. No acute osseous abnormality is evident. IMPRESSION: Endotracheal tube approximately 2 cm from carina. Left lower lobe consolidation. Electronically Signed   By: Edgardo Roys.D.  On: 10/13/2016 05:29   Dg Chest Portable 1 View  Result Date: 10/13/2016 CLINICAL DATA:   Post intubation. EXAM: PORTABLE CHEST 1 VIEW COMPARISON:  10/12/2016 FINDINGS: Endotracheal tube is been placed with tip measuring 3.9 cm above the carina. An enteric tube was placed. Tip is off the field of view but below the left hemidiaphragm. Shallow inspiration. Borderline heart size without vascular congestion or edema. No focal consolidation in the lungs. Degenerative changes in the shoulders. Tortuous aorta. IMPRESSION: Appliances appear in satisfactory position. Mild cardiac enlargement. No focal airspace disease or consolidation in the lungs. Electronically Signed   By: Lucienne Capers M.D.   On: 10/13/2016 01:23   Dg Chest Port 1 View  Result Date: 10/12/2016 CLINICAL DATA:  Weakness for 2 weeks. Shortness of breath and wheezing tonight. EXAM: PORTABLE CHEST 1 VIEW COMPARISON:  07/11/2016 FINDINGS: Cardiac enlargement without vascular congestion or edema. No focal consolidation in the lungs. No blunting of costophrenic angles. No pneumothorax. Degenerative changes in the spine and shoulders. IMPRESSION: Cardiac enlargement.  No evidence of active pulmonary disease. Electronically Signed   By: Lucienne Capers M.D.   On: 10/12/2016 23:55    STUDIES:  CT CHEST/NECK/SOFT TISSUE W/O 11/24: IMPRESSION: 1. Fluid and debris throughout the oropharynx, hypopharynx, and supraglottic airway from intubation. Suboptimal evaluation of the mucosa due to airway opacification and lack of contrast. Follow-up contrast examination after extubation and/or direct visualization recommended for evaluation of the supraglottic mass. 2. No cervical lymphadenopathy identified. 3. Left lower lobe consolidation and nodular opacities throughout the lungs compatible with pneumonia. Small left pleural effusion. 4. Endotracheal tube 1 cm from carina, retraction recommended. 5. Enteric tube tip below the field of view in the stomach. 6. Moderate coronary artery calcifications. 7. Borderline main pulmonary artery may  represent pulmonary artery hypertension.  MICROBIOLOGY: MRSA PCR 11/24:  Negative Blood Ctx x2 11/24 >> Urine Legionella Ag 11/24 >> Urine Streptococcal Ag 11/24 >> Respiratory Viral Panel PCR 11/24 (Tracheal) >> Tracheal Aspirate Ctx 11/24 >>  ANTIBIOTICS: Unasyn >>   SIGNIFICANT EVENTS: 11/24 - intubated, admitted to ICU.  LINES/TUBES: OETT 7.5 11/23 >> OGT 11/23 >> FOLEY 11/23 >> PIV  ASSESSMENT / PLAN:  PULMONARY A: Acute Hypercarbic Respiratory Failure Severe Community Acquired Pneumonia Left Lower Lobe Supraglottic Mass - Seen on intubation. Not appreciated on Neck CT w/o.  H/O Tobacco Use - Cigarettes & chewing tobacco  P:   Full ventilator support Intermittent ABG & Port CXR Vap Bundle Xopenex & Atrovent Nebs q6hr Scheduled Consulting ENT for Evaluation  CARDIOVASCULAR A:  NSTEMI/Elevated Troponin I - S/P 300mg  Rectal ASA. SVT - Seen since admission. H/O CHF H/O HTN H/o Hyperlipidemia  P:  Continuous Telemetry Monitoring Vitals per Unit Protocol Heparin gtt per Pharmacy Protocol Complete Echocardiogram pending ASA 81mg  VT daily Continue home captopril, pravastatin Holding home Lasix Holding on Cardiology Consult for now Trending Troponin I  RENAL A:   Lactic Acidosis - Resolved. Mild. Hypokalemia - Replaced. H/O CKD Stage   P:   Trending UOP with Foley Monitoring renal function & electrolytes daily Replacing electrolytes as indicated KCl 13mEq IV x1 & 83mEq VT x1 Magnesium Sulfate 1gm IV  GASTROINTESTINAL A:   Mild Protein-Calorie Malnutrition H/O GERD  P:   NPO Pepcid IV q24hr Holding on tube feedings  HEMATOLOGIC/ONCOLOGIC A:   Anemia - No signs of active bleeding. Chronic w/ Hgb 10.4-11.1. Supraglottic Mass - Not appreciated w/ noncontrast neck CT.  P:  Trending Cell Counts daily w/ CBC Heparin gtt  per Pharmacy Protocol  ENT Consult for assessment FeSO4 daily  INFECTIOUS A:   Sepsis Severe Community Acquired  Pneumonia Left Lower Lobe  P:   Empiric Unasyn Day #1 Trending Procalcitonin per Algorithm Awaiting Culture Results Checking Respiratory Viral Panel PCR from Tracheal Aspirate as well as Urine Strep & Legionella Antigens  ENDOCRINE A:   H/O Diabetes Mellitus Type 2  P:   Checking Hgb A1c SSI per Sensitive Algorithm Accu-Checks q4hr Holding home glimepiride  NEUROLOGIC A:   Acute Encephalopathy - Likely secondary to hypercarbia vs toxic metabolic. Sedation on Ventilator H/O Neuropathy - Secondary to diabetes.  P:   RASS goal: 0 to -1 Versed IV prn Sedation Fentanyl IV prn Pain/Discomfort. Holding home Nortriptyline   FAMILY  - Updates: Daughter-in-law updated at bedside 11/24 by Dr. Ashok Cordia.  - Inter-disciplinary family meet or Palliative Care meeting due by:  11/30  TODAY'S SUMMARY:  75 y.o. female known to have chronic kidney disease not on dialysis, known to have chronic systolic and diastolic congestive heart failure, presenting with 2 week history of shortness of breath, worsening dysphagia. ABG with hypercapnia. On intubation, there was note of supraglottic mass. Patient still not waking up. Checking CT head without contrast. Awaiting echocardiogram before consulting cardiology. Consulting ENT for supraglottic "mass".  I have spent an additional 36 minutes of critical care time today caring for the patient and consult to ENT for further evaluation as well as reviewing the patient's electronic medical record.  Sonia Baller Ashok Cordia, M.D. [facility] Pulmonary & Critical Care Pager:  [phone] After 3pm or if no response, call [phone] 10/13/2016, 10:19 AM

## 2016-10-13 NOTE — Progress Notes (Signed)
Initial Nutrition Assessment  DOCUMENTATION CODES:   Non-severe (moderate) malnutrition in context of acute illness/injury  INTERVENTION:  - If pt to remain intubated >/= 24 hours, recommend Vital 1.2 @ 25 mL/hr and increase by 10 mL every 8 hours to reach goal rate of Vital 1.2 @ 55 mL/hr. At goal rate, this regimen will provide 1584 kcal, 99 grams of protein, and 1071 mL free water. - RD will follow-up 11/27.  NUTRITION DIAGNOSIS:   Inadequate oral intake related to poor appetite, inability to eat as evidenced by per patient/family report, NPO status  GOAL:   Patient will meet greater than or equal to 90% of their needs  MONITOR:   Vent status, Weight trends, Labs, Skin, I & O's  REASON FOR ASSESSMENT:   Malnutrition Screening Tool, Ventilator  ASSESSMENT:   75 year old female with past medical history as below, which is significant for diabetes, CHF, chronic kidney disease, and hypertension. She presented to [facility] emergency department 10/12/2016 with complaints of shortness of breath and generalized weakness for about 2 weeks. She has additionally suffered a 30 pound weight loss over some recent period of time which is not entirely clear. Recently treated with antibiotics as an outpatient for suspected bronchitis vs pneumonia.Upon arrival to the emergency department she was on 4 L of supplemental O2 via nasal cannula and still had increased work of breathing.due to her somnolence ABG was ordered which showed  Hypercapnic respiratory failure and she was started on BiPAP. She was known to be a DO NOT RESUSCITATE, however,  Family has decided to proceed with intubation, which was done in the emergency department. During intubation a supraglottic mass was noted by emergency room provider which made intubation difficult.  Pt seen for MST and new vent. BMI indicates overweight status. Pt is intubated with OGT in place with ~300cc dark drainage present at time of RD visit.  Pt's daughter-in-law is at bedside and provides all information. She states that she lives out of town and the information that she provided to RD was provided to her over the phone by the pt PTA. For ~2 weeks pt had poor appetite/was eating less and also stated that it was difficult to swallow solid foods and liquids (noted in H&P note that intubation was difficult d/t supraglottic mass). Daughter-in-law states that she can tell that the pt lost weight by visualizing her but she is unsure of pt's UBW or how much weight pt may have lost.   Physical assessment shows mild fat wasting around orbital area but no other muscle or fat wasting noted; mild edema to BLE. Per chart review, pt has lost 7 lbs (4% body weight) in the past 18 days which is significant for time frame. Pt meets criteria based on weight loss and estimated <75% intakes for >/=7 days PTA.   Patient is currently intubated on ventilator support MV: 6.7 L/min Temp (24hrs), Avg:95.7 F (35.4 C), Min:90.9 F (32.7 C), Max:99.9 F (37.7 C) Propofol: none  Medications reviewed; 20 mg IV Pepcid, sliding scale Novolog, 1 g IV Mg sulfate x1 dose today, 40 mEq KCl per OGT x1 dose today, 30 mEq IV KCl x1 dose today.  Labs reviewed; CBGs: 85 and 104 mg/dL, Na: 148 mmol/L, K: 3 mmol/L, BUN: 58 mg/dL, creatinine: 1.85 mg/dL, GFR: 30 mL/min.  Drip: Heparin @ 1000 units/hr.    Diet Order:  Diet NPO time specified  Skin:  Wound (see comment) (Stage 2 L buttocks pressure injury)  Last BM:  PTA/unknown  Height:   Ht Readings from Last 1 Encounters:  10/13/16 5\' 6"  (1.676 m)    Weight:   Wt Readings from Last 1 Encounters:  10/13/16 179 lb 7.3 oz (81.4 kg)    Ideal Body Weight:  59.09 kg  BMI:  Body mass index is 28.96 kg/m.  Estimated Nutritional Needs:   Kcal:  [id number]  Protein:  98-122 grams (1.2-1.5 grams/kg)  Fluid:  1.5-1.7 L/day  EDUCATION NEEDS:   No education needs identified at this time    Jarome Matin, MS,  RD, LDN, CNSC Inpatient Clinical Dietitian Pager # [phone] After hours/weekend pager # [phone]

## 2016-10-14 DIAGNOSIS — J392 Other diseases of pharynx: Secondary | ICD-10-CM

## 2016-10-14 DIAGNOSIS — I471 Supraventricular tachycardia: Secondary | ICD-10-CM | POA: Insufficient documentation

## 2016-10-14 DIAGNOSIS — N183 Chronic kidney disease, stage 3 (moderate): Secondary | ICD-10-CM

## 2016-10-14 DIAGNOSIS — I4719 Other supraventricular tachycardia: Secondary | ICD-10-CM | POA: Insufficient documentation

## 2016-10-14 DIAGNOSIS — R748 Abnormal levels of other serum enzymes: Secondary | ICD-10-CM

## 2016-10-14 DIAGNOSIS — I10 Essential (primary) hypertension: Secondary | ICD-10-CM

## 2016-10-14 DIAGNOSIS — E440 Moderate protein-calorie malnutrition: Secondary | ICD-10-CM | POA: Insufficient documentation

## 2016-10-14 DIAGNOSIS — E1122 Type 2 diabetes mellitus with diabetic chronic kidney disease: Secondary | ICD-10-CM

## 2016-10-14 DIAGNOSIS — I5042 Chronic combined systolic (congestive) and diastolic (congestive) heart failure: Secondary | ICD-10-CM

## 2016-10-14 LAB — RENAL FUNCTION PANEL
ALBUMIN: 2.5 g/dL — AB (ref 3.5–5.0)
Anion gap: 9 (ref 5–15)
BUN: 58 mg/dL — AB (ref 6–20)
CALCIUM: 9 mg/dL (ref 8.9–10.3)
CO2: 24 mmol/L (ref 22–32)
CREATININE: 2.18 mg/dL — AB (ref 0.44–1.00)
Chloride: 117 mmol/L — ABNORMAL HIGH (ref 101–111)
GFR calc Af Amer: 24 mL/min — ABNORMAL LOW (ref >60)
GFR, EST NON AFRICAN AMERICAN: 21 mL/min — AB (ref >60)
Glucose, Bld: 90 mg/dL (ref 65–99)
PHOSPHORUS: 2.8 mg/dL (ref 2.5–4.6)
Potassium: 3.7 mmol/L (ref 3.5–5.1)
Sodium: 150 mmol/L — ABNORMAL HIGH (ref 135–145)

## 2016-10-14 LAB — HEPARIN LEVEL (UNFRACTIONATED)
HEPARIN UNFRACTIONATED: 0.74 [IU]/mL — AB (ref 0.30–0.70)
HEPARIN UNFRACTIONATED: 0.63 [IU]/mL (ref 0.30–0.70)
Heparin Unfractionated: 0.66 IU/mL (ref 0.30–0.70)

## 2016-10-14 LAB — HEMOGLOBIN A1C
HEMOGLOBIN A1C: 7.1 % — AB (ref 4.8–5.6)
MEAN PLASMA GLUCOSE: 157 mg/dL

## 2016-10-14 LAB — CBC WITH DIFFERENTIAL/PLATELET
BASOS ABS: 0 10*3/uL (ref 0.0–0.1)
BASOS PCT: 0 %
EOS ABS: 0 10*3/uL (ref 0.0–0.7)
EOS PCT: 0 %
HCT: 27.2 % — ABNORMAL LOW (ref 36.0–46.0)
Hemoglobin: 9.1 g/dL — ABNORMAL LOW (ref 12.0–15.0)
Lymphocytes Relative: 15 %
Lymphs Abs: 0.9 10*3/uL (ref 0.7–4.0)
MCH: 28.9 pg (ref 26.0–34.0)
MCHC: 33.5 g/dL (ref 30.0–36.0)
MCV: 86.3 fL (ref 78.0–100.0)
Monocytes Absolute: 0.6 10*3/uL (ref 0.1–1.0)
Monocytes Relative: 10 %
Neutro Abs: 4.7 10*3/uL (ref 1.7–7.7)
Neutrophils Relative %: 75 %
PLATELETS: 156 10*3/uL (ref 150–400)
RBC: 3.15 MIL/uL — AB (ref 3.87–5.11)
RDW: 16.2 % — ABNORMAL HIGH (ref 11.5–15.5)
WBC: 6.2 10*3/uL (ref 4.0–10.5)

## 2016-10-14 LAB — GLUCOSE, CAPILLARY
GLUCOSE-CAPILLARY: 90 mg/dL (ref 65–99)
GLUCOSE-CAPILLARY: 134 mg/dL — AB (ref 65–99)
Glucose-Capillary: 122 mg/dL — ABNORMAL HIGH (ref 65–99)
Glucose-Capillary: 85 mg/dL (ref 65–99)
Glucose-Capillary: 92 mg/dL (ref 65–99)
Glucose-Capillary: 94 mg/dL (ref 65–99)
Glucose-Capillary: 98 mg/dL (ref 65–99)

## 2016-10-14 LAB — MAGNESIUM: Magnesium: 1.8 mg/dL (ref 1.7–2.4)

## 2016-10-14 LAB — PROCALCITONIN: Procalcitonin: <0.1 ng/mL

## 2016-10-14 LAB — LEGIONELLA PNEUMOPHILA SEROGP 1 UR AG: L. PNEUMOPHILA SEROGP 1 UR AG: NEGATIVE

## 2016-10-14 MED ORDER — VITAL HIGH PROTEIN PO LIQD: 1000.0000 mL | ORAL | Status: DC

## 2016-10-14 MED ORDER — MAGNESIUM SULFATE 2 GM/50ML IV SOLN
2.0000 g | Freq: Once | INTRAVENOUS | Status: AC
  Administered 2016-10-14: 2 g via INTRAVENOUS
  Filled 2016-10-14: qty 50

## 2016-10-14 MED ORDER — VITAL AF 1.2 CAL PO LIQD
1000.0000 mL | ORAL | Status: DC
  Administered 2016-10-14 – 2016-10-16 (×3): 1000 mL
  Filled 2016-10-14 (×4): qty 1000

## 2016-10-14 MED ORDER — LACTATED RINGERS IV BOLUS (SEPSIS)
1000.0000 mL | Freq: Once | INTRAVENOUS | Status: AC
  Administered 2016-10-14: 1000 mL via INTRAVENOUS

## 2016-10-14 MED ORDER — FREE WATER
200.0000 mL | Freq: Three times a day (TID) | Status: DC
  Administered 2016-10-14 – 2016-10-16 (×6): 200 mL

## 2016-10-14 MED ORDER — PRO-STAT SUGAR FREE PO LIQD: 30.0000 mL | Freq: Two times a day (BID) | ORAL | Status: DC

## 2016-10-14 MED ORDER — PANTOPRAZOLE SODIUM 40 MG PO PACK
40.0000 mg | PACK | Freq: Every day | ORAL | Status: DC
  Administered 2016-10-14 – 2016-10-16 (×3): 40 mg
  Filled 2016-10-14 (×3): qty 20

## 2016-10-14 MED ORDER — CHLORHEXIDINE GLUCONATE 0.12 % MT SOLN
OROMUCOSAL | Status: AC
  Administered 2016-10-14: 15 mL via OROMUCOSAL
  Filled 2016-10-14: qty 15

## 2016-10-14 NOTE — Progress Notes (Signed)
CRITICAL VALUE ALERT  Critical value received:  Troponin 1.09  Date of notification:  11/24//2017  Time of notification:  [id number] pm  Critical value read back: yes  Nurse who received alert:  Dyann Ruddle, RN  MD notified (1st page):  E-link, MD  Time of first page:  0835 pm  MD notified (2nd page):  Time of second page:  Responding MD:  Margaree Mackintosh, MD  Time MD responded:  [id number] pm

## 2016-10-14 NOTE — Progress Notes (Signed)
PULMONARY / CRITICAL CARE MEDICINE   Name: Mandy Rhodes MRN: [id number] DOB: [birth year]    ADMISSION DATE:  10/12/2016 CONSULTATION DATE:  10/13/2016  REFERRING MD:  Dr. Roxanne Mins EDP  CHIEF COMPLAINT:  Shortness of breath  HISTORY OF PRESENT ILLNESS:  75 y.o. female with past medical history as below, which is significant for diabetes, CHF, chronic kidney disease, and hypertension. She presented to [facility] emergency department 10/12/2016 with complaints of shortness of breath and generalized weakness for about 2 weeks. She has additionally suffered a 30 pound weight loss over some recent period of time which is not entirely clear. Recently treated with antibiotics as an outpatient for suspected bronchitis vs pneumonia.Upon arrival to the emergency department she was on 4 L of supplemental O2 via nasal cannula and still had increased work of breathing.due to her somnolence ABG was ordered which showed  Hypercapnic respiratory failure and she was started on BiPAP. She was known to be a DO NOT RESUSCITATE, however,  Family has decided to proceed with intubation, which was done in the emergency department. During intubation a supraglottic mass was noted by emergency room provider which made intubation difficult. Laboratory evaluation significant forserum creatinine 1.8, calcium 10.5, troponin 0.53, WBC 12, glucose 137.PCCM asked to admit  SUBJECTIVE: Hypotension overnight responded to IV fluids. Patient evaluated by ENT and cardiology. More awake and alert this morning. Respiratory therapist reports patient has tolerated pressure support wean to 5/5. Patient nods no to any pain or difficulty breathing. Does admit that she is hungry.  REVIEW OF SYSTEMS:  Unable to obtain due to intubation.  VITAL SIGNS: BP (!) 90/55   Pulse 94   Temp 99.7 F (37.6 C) (Core (Comment))   Resp (!) 0   Ht 5\' 6"  (1.676 m)   Wt 183 lb 6.8 oz (83.2 kg)   SpO2 99%   BMI 29.61 kg/m   HEMODYNAMICS:     VENTILATOR SETTINGS: Vent Mode: CPAP;PSV FiO2 (%):  [25 %-40 %] 25 % Set Rate:  [14 bmp] 14 bmp Vt Set:  [470 mL] 470 mL PEEP:  [5 cmH20] 5 cmH20 Pressure Support:  [5 cmH20] 5 cmH20 Plateau Pressure:  [16 cmH20-22 cmH20] 18 cmH20  INTAKE / OUTPUT: I/O last 3 completed shifts: In: 5135.9 [I.V.:380.9; [id number]; IV [id number] Out: [id number] [Urine:710; Emesis/NG output:350]  PHYSICAL EXAMINATION: General:  Eyes open. Multiple family members at bedside. No distress. Neuro:  Following commands. Grossly nonfocal. Moving all extremities equally. HEENT:  Endotracheal tube in place. No scleral icterus or injection. Cardiovascular:  Regular rate and rhythm. No edema. No appreciable JVD. Pulmonary: Clear with auscultation bilaterally. Symmetric chest wall rise on ventilator. Abdomen:  Soft. Protuberant. Normal bowel sounds. Musculoskeletal:  No joint effusion or deformity appreciated. Skin:  Warm and dry. no rash on exposed skin.  LABS:  BMET  Recent Labs Lab 10/13/16 0534 10/13/16 2108 10/14/16 0404  NA 148* 150* 150*  K 3.0* 3.5 3.7  CL 111 117* 117*  CO2 28 24 24   BUN 58* 60* 58*  CREATININE 1.85* 1.97* 2.18*  GLUCOSE 122* 94 90    Electrolytes  Recent Labs Lab 10/13/16 0534 10/13/16 2108 10/14/16 0404  CALCIUM 9.5 9.0 9.0  MG 1.8  --  1.8  PHOS 2.6  --  2.8    CBC  Recent Labs Lab 10/13/16 0534 10/13/16 2108 10/14/16 0404  WBC 10.2 7.6 6.2  HGB 10.6* 9.3* 9.1*  HCT 32.4* 27.6* 27.2*  PLT 179 156 156  Coag's  Recent Labs Lab 10/13/16 0058  APTT 32  INR 1.22    Sepsis Markers  Recent Labs Lab 10/13/16 0310 10/13/16 0534 10/13/16 2109 10/14/16 0404  LATICACIDVEN 2.4* 1.8 1.4  --   PROCALCITON <0.10  --   --  <0.10    ABG  Recent Labs Lab 10/12/16 2324 10/13/16 0202 10/13/16 0443  PHART 7.225* 7.539* 7.509*  PCO2ART 78.0* 29.8* 33.5  PO2ART 230* 51.1* 53.4*    Liver Enzymes  Recent Labs Lab 10/12/16 2302  10/14/16 0404  AST 33  --   ALT 30  --   ALKPHOS 97  --   BILITOT 1.0  --   ALBUMIN 3.3* 2.5*    Cardiac Enzymes  Recent Labs Lab 10/13/16 0310 10/13/16 1033 10/13/16 1716  TROPONINI 0.64* 0.87* 1.09*    Glucose  Recent Labs Lab 10/13/16 1600 10/13/16 1943 10/14/16 0011 10/14/16 0357 10/14/16 0804 10/14/16 1133  GLUCAP 85 85 94 85 92 90    Imaging No results found.  STUDIES:  CT CHEST/NECK/SOFT TISSUE W/O 11/24: IMPRESSION: 1. Fluid and debris throughout the oropharynx, hypopharynx, and supraglottic airway from intubation. Suboptimal evaluation of the mucosa due to airway opacification and lack of contrast. Follow-up contrast examination after extubation and/or direct visualization recommended for evaluation of the supraglottic mass. 2. No cervical lymphadenopathy identified. 3. Left lower lobe consolidation and nodular opacities throughout the lungs compatible with pneumonia. Small left pleural effusion. 4. Endotracheal tube 1 cm from carina, retraction recommended. 5. Enteric tube tip below the field of view in the stomach. 6. Moderate coronary artery calcifications. 7. Borderline main pulmonary artery may represent pulmonary artery hypertension. CT HEAD W/O 11/24:  No acute intracranial abnormality. Moderate brain parenchymal atrophy and chronic microvascular disease. TTE 11/24: LV normal in size with EF 30-35%. Moderate concentric LVH. Appearance consistent with infiltrative cardiomyopathy. Moderate diffuse hypokinesis without regional variation. Grade 2 diastolic dysfunction. LA moderately dilated & RA normal in size. RV normal in size and function. No aortic stenosis or regurgitation. Aortic root normal in size. Trivial mitral regurgitation without stenosis. No pulmonic regurgitation. Mild tricuspid regurgitation. No pericardial effusion.  MICROBIOLOGY: MRSA PCR 11/24:  Negative Blood Ctx x2 11/24 >> Urine Legionella Ag 11/24 >> Urine Streptococcal Ag 11/24:   Negative Respiratory Viral Panel PCR 11/24 (Tracheal):  Negative Tracheal Aspirate Ctx 11/24 >>  ANTIBIOTICS: Unasyn 11/24 >>   SIGNIFICANT EVENTS: 11/24 - intubated, admitted to ICU.  LINES/TUBES: OETT 7.5 11/23 >> OGT 11/23 >> FOLEY 11/23 >> PIV  ASSESSMENT / PLAN:  PULMONARY A: Acute Hypercarbic Respiratory Failure Severe Community Acquired Pneumonia Left Lower Lobe Supraglottic Mass - Seen on intubation. Not appreciated on Neck CT w/o.  H/O Tobacco Use - Cigarettes & chewing tobacco  P:   Full ventilator support Intermittent ABG & Port CXR Vap Bundle Xopenex & Atrovent Nebs q6hr Scheduled ENT planning on biopsy & possible tracheostomy once off heparin drip  CARDIOVASCULAR A:  NSTEMI/Elevated Troponin I - S/P 300mg  Rectal ASA. SVT - Seen since admission. Borderline Hypotension - Likely due to hypovolemia.  Probable Infiltrative Cardiomyopathy - Seen on TTE. Question amyloidosis.  H/O CHF H/O HTN H/o Hyperlipidemia  P:  Continuous Telemetry Monitoring Vitals per Unit Protocol Heparin gtt per Pharmacy Protocol for now for arrhythmia  Cardiology Consulted & appreciate recommendations ASA 81mg  VT daily Continue home captopril, pravastatin Holding home Lasix LR 1 L bolus now  RENAL A:   Acute Renal Failure - Worsening. Lactic Acidosis - Resolved. Mild. Hypokalemia - Resolved.  Hypernatremia - Stable. Hyperchloremia - Stable. H/O CKD Stage   P:   Trending UOP with Foley Monitoring renal function & electrolytes daily Replacing electrolytes as indicated Magnesium Sulfate 2gm IV Starting Free Water 200cc VT q8hr  GASTROINTESTINAL A:   Moderate Protein-Calorie Malnutrition H/O GERD  P:   NPO Switch to Protonix VT daily D/C Pepcid Dietician consult for tube feedings  HEMATOLOGIC/ONCOLOGIC A:   Anemia - No signs of active bleeding. Chronic w/ Hgb 10.4-11.1. Supraglottic Mass - Not appreciated w/ noncontrast neck CT.  P:  Trending Cell Counts  daily w/ CBC Heparin gtt per Pharmacy Protocol  ENT Consulted & appreciate assistance FeSO4 daily  INFECTIOUS A:   Possible Sepsis Possible Severe Community Acquired Pneumonia Left Lower Lobe  P:   Empiric Unasyn Day #2 Trending Procalcitonin per Algorithm Awaiting Culture Results & urine legionella antigen result  ENDOCRINE A:   H/O Diabetes Mellitus Type 2 - A1c 7.2.  P:   SSI per Sensitive Algorithm Accu-Checks q4hr Holding home glimepiride  NEUROLOGIC A:   Acute Encephalopathy - Likely secondary to hypercarbia vs toxic metabolic. Sedation on Ventilator H/O Neuropathy - Secondary to diabetes.  P:   RASS goal: 0 to -1 Versed IV prn Sedation Fentanyl IV prn Pain/Discomfort. Holding home Nortriptyline   FAMILY  - Updates: Family updated by Dr. Ashok Cordia 11/25 at bedside.  - Inter-disciplinary family meet or Palliative Care meeting due by:  11/30  TODAY'S SUMMARY:  75 y.o. female known to have chronic kidney disease not on dialysis, known to have chronic systolic and diastolic congestive heart failure, presenting with 2 week history of shortness of breath, worsening dysphagia. Patient's mental status has remarkably improved. Respiratory status is improving as well. Awaiting family discussion with ENT and decision on timing of biopsy and potential tracheostomy. I did explain this to the patient's family at bedside. Continuing empiric antibiotics. Holding on extubation pending biopsy.  I have spent a total of 34 minutes of critical care time today caring for the patient, updating family at bedside, and reviewing the patient's electronic medical record.  Sonia Baller Ashok Cordia, M.D. [facility] Pulmonary & Critical Care Pager:  [phone] After 3pm or if no response, call [phone] 10/14/2016, 1:04 PM

## 2016-10-14 NOTE — Progress Notes (Signed)
NUTRITION NOTE  Consult for TF initiation and management received. Pt seen by this RD for full assessment 11/24 with associated note at 1351. Recommendation at that time for Vital 1.2 @ 25 mL/hr and increase by 10 mL every 8 hours to reach goal rate of Vital 1.2 @ 55 mL/hr. At goal rate, this regimen will provide 1584 kcal, 99 grams of protein, and 1071 mL free water.  Will place order based on above recommendation. Order in place per MD for 200 mL free water TID (600 mL/day). RD will follow-up 11/27.   Estimated Nutritional Needs:  Kcal:  [id number] Protein:  98-122 grams (1.2-1.5 grams/kg) Fluid:  1.5-1.7 L/day    Jarome Matin, MS, RD, LDN, [facility] Inpatient Clinical Dietitian Pager # [phone] After hours/weekend pager # [phone]

## 2016-10-14 NOTE — Consult Note (Signed)
Reason for Consult: Abnormal troponin  Requesting Physician: Corrie Dandy  Cardiologist: Debara Pickett  HPI: This is a 75 y.o. female with a past medical history significant for nonischemic cardiomyopathy (highly suspicious for amyloidosis by MRI, nondiagnostic fat pad biopsy) with EF 35-40%, minor CAD by recent cath (20% LAD May 2017), HTN, DM, CKD stage 3, admitted with acute respiratory failure with hypoxia, evidence of lower left lobe pneumonia, recent important weight loss and currently intubated and mechanically ventilated. Hypotensive.  Angina was not a complaint per notes at time of admission. Troponin elevated at approximately 1. Has had a transient LBBB, possibly rate related, but no frank ischemic changes on ECG. Intermittent episodes of SVT 125 bpm, abrupt onset and termination, PAT versus AFlutter.  PMHx:  Past Medical History:  Diagnosis Date  . Anemia   . Arthritis   . CHF (congestive heart failure) ([place])   . Chronic kidney disease    Renal Insuffiency, see [facility] once a year  . Diabetes mellitus    type 2  . GERD (gastroesophageal reflux disease)   . History of rotator cuff tear    Torn right rotator cuff.  . History of spinal stenosis   . Hypertension   . Neuropathy associated with endocrine disorder [facility])    Past Surgical History:  Procedure Laterality Date  . ABDOMINAL HYSTERECTOMY  1971   Partial  . APPENDECTOMY    . BACK SURGERY  2009   Disectomy  . CARDIAC CATHETERIZATION N/A 03/22/2016   Procedure: Left Heart Cath and Coronary Angiography;  Surgeon: Peter M Martinique, MD;  Location: [place] [facility];  Service: Cardiovascular;  Laterality: N/A;  . CHOLECYSTECTOMY  1969   Status post   . CYST REMOVAL TRUNK Right 12/03/2014   Procedure: EXCISION OF RIGHT BACK CYST ;  Surgeon: Coralie Keens, MD;  Location: [place];  Service: General;  Laterality: Right;  . FOOT SURGERY Bilateral    bone removed  . NEUROPLASTY / TRANSPOSITION MEDIAN NERVE AT CARPAL  TUNNEL Bilateral    Hx  Left carpal tunnel repair    FAMHx: Family History  Problem Relation Age of Onset  . Diabetes type II Mother   . Hypertension Father   . Diabetes type II Sister   . Ovarian cancer Sister   . Diabetes type II Other   . Stroke Neg Hx   . CAD Neg Hx   . Heart failure Neg Hx     SOCHx:  reports that she has quit smoking. She has never used smokeless tobacco. She reports that she does not drink alcohol or use drugs.  ALLERGIES: Allergies  Allergen Reactions  . Sulfa Antibiotics Swelling    ROS: Review of systems not obtained due to patient factors.  HOME MEDICATIONS: Prescriptions Prior to Admission  Medication Sig Dispense Refill Last Dose  . aspirin EC 81 MG EC tablet Take 1 tablet (81 mg total) by mouth daily. 30 tablet 0 10/12/2016 at Unknown time  . Calcium Carbonate-Vit D-Min (CALCIUM 1200 PO) Take 600 mg by mouth daily.    10/12/2016 at Unknown time  . captopril (CAPOTEN) 12.5 MG tablet Take 1 tablet (12.5 mg total) by mouth 2 (two) times daily. 180 tablet 3 10/12/2016 at Unknown time  . cholecalciferol (VITAMIN D) 1000 UNITS tablet Take 1,000 Units by mouth daily. Reported on 03/27/2016   10/12/2016 at Unknown time  . docusate sodium (COLACE) 100 MG capsule Take 100 mg by mouth daily as needed for mild constipation.  Past Week at Unknown time  . ferrous sulfate 325 (65 FE) MG EC tablet Take 325 mg by mouth daily.    10/12/2016 at Unknown time  . furosemide (LASIX) 40 MG tablet Take 1 tablet (40 mg total) by mouth daily. OK TO TAKE ONE ADDITIONAL TABLET AS NEEDED FOR WEIGHT GAIN (Patient taking differently: Take 40 mg by mouth every other day. OK TO TAKE ONE ADDITIONAL TABLET AS NEEDED FOR WEIGHT GAIN) 100 tablet 3 10/11/2016 at Unknown time  . glimepiride (AMARYL) 1 MG tablet Take 1 mg by mouth daily with breakfast.    10/12/2016 at Unknown time  . Multiple Vitamin (MULTIVITAMIN) tablet Take 1 tablet by mouth daily.     10/12/2016 at Unknown time  .  nortriptyline (PAMELOR) 10 MG capsule Take 10 mg by mouth at bedtime.   Past Week at Unknown time  . omeprazole (PRILOSEC) 40 MG capsule Take 40 mg by mouth daily.     10/12/2016 at Unknown time  . potassium chloride SA (K-DUR,KLOR-CON) 20 MEQ tablet Take 1 tablet (20 mEq total) by mouth daily. May take an extra tablet if extra furosemide is taken (Patient taking differently: Take 20 mEq by mouth every other day. May take an extra tablet if extra furosemide is taken) 30 tablet 3 10/11/2016 at unknown  . pravastatin (PRAVACHOL) 40 MG tablet Take 40 mg by mouth daily.     Past Week at Unknown time  . PRESCRIPTION MEDICATION 1 drop every 3 (three) months. Eye drops to be used the day before, of , and after eye injections       HOSPITAL MEDICATIONS: I have reviewed the patient's current medications. Scheduled: . ampicillin-sulbactam (UNASYN) IV  3 g Intravenous Q12H  . aspirin  81 mg Per Tube Daily  . chlorhexidine gluconate (MEDLINE KIT)  15 mL Mouth Rinse BID  . famotidine (PEPCID) IV  20 mg Intravenous Q24H  . ferrous sulfate  325 mg Oral Daily  . insulin aspart  0-9 Units Subcutaneous Q4H  . ipratropium  0.5 mg Nebulization Q6H  . levalbuterol  0.63 mg Nebulization Q6H  . mouth rinse  15 mL Mouth Rinse QID  . pravastatin  40 mg Oral Daily   Continuous: . heparin 750 Units/hr (10/14/16 0436)    VITALS: Blood pressure (!) 90/55, pulse (!) 126, temperature (!) 100.8 F (38.2 C), temperature source Core (Comment), resp. rate 17, height '5\' 6"'  (1.676 m), weight 183 lb 6.8 oz (83.2 kg), SpO2 99 %.  PHYSICAL EXAM:  General: Alert, calm, no distress on ventilator Head: no evidence of trauma, PERRL, EOMI, no exophtalmos or lid lag, no myxedema, no xanthelasma; normal ears, nose and oropharynx Neck: normal jugular venous pulsations and no hepatojugular reflux; brisk carotid pulses without delay and no carotid bruits Chest: clear to auscultation anteriorly, slow symmetrical and full respiratory  excursions Cardiovascular: normal position and quality of the apical impulse, regular rhythm, distant but otherwise normal first heart sound and normal second heart sound, no rubs or gallops, no murmur Abdomen: no tenderness or distention, no masses by palpation, no abnormal pulsatility or arterial bruits, normal bowel sounds, no hepatosplenomegaly Extremities: no clubbing, cyanosis;  no edema; 2+ radial, ulnar and brachial pulses bilaterally; 2+ right femoral, posterior tibial and dorsalis pedis pulses; 2+ left femoral, posterior tibial and dorsalis pedis pulses; no subclavian or femoral bruits Neurological: grossly nonfocal   LABS  CBC  Recent Labs  10/13/16 2108 10/14/16 0404  WBC 7.6 6.2  NEUTROABS 6.0 4.7  HGB  9.3* 9.1*  HCT 27.6* 27.2*  MCV 86.0 86.3  PLT 156 779   Basic Metabolic Panel  Recent Labs  10/13/16 0534 10/13/16 2108 10/14/16 0404  NA 148* 150* 150*  K 3.0* 3.5 3.7  CL 111 117* 117*  CO2 '28 24 24  ' GLUCOSE 122* 94 90  BUN 58* 60* 58*  CREATININE 1.85* 1.97* 2.18*  CALCIUM 9.5 9.0 9.0  MG 1.8  --  1.8  PHOS 2.6  --  2.8   Liver Function Tests  Recent Labs  10/12/16 2302 10/14/16 0404  AST 33  --   ALT 30  --   ALKPHOS 97  --   BILITOT 1.0  --   PROT 8.0  --   ALBUMIN 3.3* 2.5*   No results for input(s): LIPASE, AMYLASE in the last 72 hours. Cardiac Enzymes  Recent Labs  10/13/16 0310 10/13/16 1033 10/13/16 1716  TROPONINI 0.64* 0.87* 1.09*   BNP Invalid input(s): POCBNP D-Dimer No results for input(s): DDIMER in the last 72 hours. Hemoglobin A1C  Recent Labs  10/13/16 0534  HGBA1C 7.1*    IMAGING: Dg Abd 1 View  Result Date: 10/13/2016 CLINICAL DATA:  Enteric tube placement. EXAM: ABDOMEN - 1 VIEW COMPARISON:  None. FINDINGS: Normal bowel gas pattern. Enteric tube tip projects over the mid stomach. Multilevel degenerative changes of the lumbar spine. Moderate bilateral hip osteoarthrosis. IMPRESSION: Enteric tube tip  projects over mid stomach. Electronically Signed   By: Kristine Garbe M.D.   On: 10/13/2016 05:30   Ct Head Wo Contrast  Result Date: 10/13/2016 CLINICAL DATA:  Unresponsive patient. Patient complained of breath and generalized weakness for 2 weeks. EXAM: CT HEAD WITHOUT CONTRAST TECHNIQUE: Contiguous axial images were obtained from the base of the skull through the vertex without intravenous contrast. COMPARISON:  None. FINDINGS: Brain: No evidence of acute infarction, hemorrhage, hydrocephalus, extra-axial collection or mass lesion/mass effect. There is moderate brain parenchymal volume loss and symmetric bilateral microangiopathy. Vascular: Atherosclerotic disease at the skullbase. Skull: Normal. Negative for fracture or focal lesion. Sinuses/Orbits: No acute finding. Other: None. IMPRESSION: No acute intracranial abnormality. Moderate brain parenchymal atrophy and chronic microvascular disease. Electronically Signed   By: Fidela Salisbury M.D.   On: 10/13/2016 12:35   Ct Soft Tissue Neck Wo Contrast  Addendum Date: 10/13/2016   ADDENDUM REPORT: 10/13/2016 04:53 ADDENDUM: These results were called by telephone at the time of interpretation on 10/13/2016 at 4:53 am to Dr. Jimmy Footman, who verbally acknowledged these results. Electronically Signed   By: Kristine Garbe M.D.   On: 10/13/2016 04:53   Result Date: 10/13/2016 CLINICAL DATA:  75 y/o F; shortness of breath and generalized weakness for 2 weeks. Evaluation of supraglottic mass seen on intubation. EXAM: CT NECK AND CHEST WITHOUT CONTRAST COMPARISON:  07/15/2015 CT of cervical spine. FINDINGS: CT NECK FINDINGS Pharynx and larynx: There is fluid and debris throughout the oropharynx, hypopharynx, and supraglottic airway due to intubation and nasoenteric tube. Addition, the region is obscured by artifact from cervical fusion hardware and the endotracheal tube. No soft tissue mass can be distinguished. Laryngeal cartilage appears  intact. Subglottic airway is patent. Salivary glands: No inflammation, mass, or stone. Thyroid: Normal. Lymph nodes: None enlarged or abnormal density. Vascular: Calcific atherosclerosis of bilateral carotid bifurcations. Limited intracranial: Negative. Visualized orbits: Not within the field of view Mastoids and visualized paranasal sinuses: Normally pneumatized mastoid air cells and visualized sinuses. Skeleton: Anterior cervical fusion with plate and screws from C3 through C6. Multilevel uncovertebral  and facet hypertrophy with severe bony foraminal narrowing through the levels of fusion 1. C6-7 disc osteophyte complex with moderate to severe canal stenosis. C2-3 disc osteophyte complex with at least moderate canal stenosis. No acute osseous abnormality is identified. Flowing anterior ossification of upper thoracic spine may represent DISH. CT CHEST FINDINGS Cardiovascular: Mild cardiomegaly. No pericardial effusion. Moderate coronary artery calcification. Normal caliber thoracic aorta. Borderline enlarged main pulmonary artery. Mediastinum/Nodes: No enlarged mediastinal or axillary lymph nodes. Thyroid gland, trachea, and esophagus demonstrate no significant findings. Endotracheal tube is less than 1 cm from the carina. Lungs/Pleura: There is patchy nodular consolidation within the bilateral upper and right lower lobe and a dense consolidation within the left lower lobe with air bronchograms. Small left pleural effusion. Upper Abdomen: Enteric tube tip below the field of view in the stomach. No acute process. Musculoskeletal: Flowing anterior ossification of vertebral bodies consistent with DISH. No acute osseous abnormality. IMPRESSION: 1. Fluid and debris throughout the oropharynx, hypopharynx, and supraglottic airway from intubation. Suboptimal evaluation of the mucosa due to airway opacification and lack of contrast. Follow-up contrast examination after extubation and/or direct visualization recommended for  evaluation of the supraglottic mass. 2. No cervical lymphadenopathy identified. 3. Left lower lobe consolidation and nodular opacities throughout the lungs compatible with pneumonia. Small left pleural effusion. 4. Endotracheal tube 1 cm from carina, retraction recommended. 5. Enteric tube tip below the field of view in the stomach. 6. Moderate coronary artery calcifications. 7. Borderline main pulmonary artery may represent pulmonary artery hypertension. Electronically Signed: By: Kristine Garbe M.D. On: 10/13/2016 04:36   Ct Chest Wo Contrast  Addendum Date: 10/13/2016   ADDENDUM REPORT: 10/13/2016 04:53 ADDENDUM: These results were called by telephone at the time of interpretation on 10/13/2016 at 4:53 am to Dr. Jimmy Footman, who verbally acknowledged these results. Electronically Signed   By: Kristine Garbe M.D.   On: 10/13/2016 04:53   Result Date: 10/13/2016 CLINICAL DATA:  75 y/o F; shortness of breath and generalized weakness for 2 weeks. Evaluation of supraglottic mass seen on intubation. EXAM: CT NECK AND CHEST WITHOUT CONTRAST COMPARISON:  07/15/2015 CT of cervical spine. FINDINGS: CT NECK FINDINGS Pharynx and larynx: There is fluid and debris throughout the oropharynx, hypopharynx, and supraglottic airway due to intubation and nasoenteric tube. Addition, the region is obscured by artifact from cervical fusion hardware and the endotracheal tube. No soft tissue mass can be distinguished. Laryngeal cartilage appears intact. Subglottic airway is patent. Salivary glands: No inflammation, mass, or stone. Thyroid: Normal. Lymph nodes: None enlarged or abnormal density. Vascular: Calcific atherosclerosis of bilateral carotid bifurcations. Limited intracranial: Negative. Visualized orbits: Not within the field of view Mastoids and visualized paranasal sinuses: Normally pneumatized mastoid air cells and visualized sinuses. Skeleton: Anterior cervical fusion with plate and screws from C3  through C6. Multilevel uncovertebral and facet hypertrophy with severe bony foraminal narrowing through the levels of fusion 1. C6-7 disc osteophyte complex with moderate to severe canal stenosis. C2-3 disc osteophyte complex with at least moderate canal stenosis. No acute osseous abnormality is identified. Flowing anterior ossification of upper thoracic spine may represent DISH. CT CHEST FINDINGS Cardiovascular: Mild cardiomegaly. No pericardial effusion. Moderate coronary artery calcification. Normal caliber thoracic aorta. Borderline enlarged main pulmonary artery. Mediastinum/Nodes: No enlarged mediastinal or axillary lymph nodes. Thyroid gland, trachea, and esophagus demonstrate no significant findings. Endotracheal tube is less than 1 cm from the carina. Lungs/Pleura: There is patchy nodular consolidation within the bilateral upper and right lower lobe and a dense  consolidation within the left lower lobe with air bronchograms. Small left pleural effusion. Upper Abdomen: Enteric tube tip below the field of view in the stomach. No acute process. Musculoskeletal: Flowing anterior ossification of vertebral bodies consistent with DISH. No acute osseous abnormality. IMPRESSION: 1. Fluid and debris throughout the oropharynx, hypopharynx, and supraglottic airway from intubation. Suboptimal evaluation of the mucosa due to airway opacification and lack of contrast. Follow-up contrast examination after extubation and/or direct visualization recommended for evaluation of the supraglottic mass. 2. No cervical lymphadenopathy identified. 3. Left lower lobe consolidation and nodular opacities throughout the lungs compatible with pneumonia. Small left pleural effusion. 4. Endotracheal tube 1 cm from carina, retraction recommended. 5. Enteric tube tip below the field of view in the stomach. 6. Moderate coronary artery calcifications. 7. Borderline main pulmonary artery may represent pulmonary artery hypertension.  Electronically Signed: By: Kristine Garbe M.D. On: 10/13/2016 04:36   Dg Chest Port 1 View  Result Date: 10/13/2016 CLINICAL DATA:  75 y/o  F; endotracheal tube placement. EXAM: PORTABLE CHEST 1 VIEW COMPARISON:  10/13/2016 chest CT. FINDINGS: Endotracheal tube is 2 cm from the carina. Enteric tube tip below the field of view in the abdomen. Left lower lobe consolidation. No pneumothorax. Stable cardiac silhouette. No acute osseous abnormality is evident. IMPRESSION: Endotracheal tube approximately 2 cm from carina. Left lower lobe consolidation. Electronically Signed   By: Kristine Garbe M.D.   On: 10/13/2016 05:29   Dg Chest Portable 1 View  Result Date: 10/13/2016 CLINICAL DATA:  Post intubation. EXAM: PORTABLE CHEST 1 VIEW COMPARISON:  10/12/2016 FINDINGS: Endotracheal tube is been placed with tip measuring 3.9 cm above the carina. An enteric tube was placed. Tip is off the field of view but below the left hemidiaphragm. Shallow inspiration. Borderline heart size without vascular congestion or edema. No focal consolidation in the lungs. Degenerative changes in the shoulders. Tortuous aorta. IMPRESSION: Appliances appear in satisfactory position. Mild cardiac enlargement. No focal airspace disease or consolidation in the lungs. Electronically Signed   By: Lucienne Capers M.D.   On: 10/13/2016 01:23   Dg Chest Port 1 View  Result Date: 10/12/2016 CLINICAL DATA:  Weakness for 2 weeks. Shortness of breath and wheezing tonight. EXAM: PORTABLE CHEST 1 VIEW COMPARISON:  07/11/2016 FINDINGS: Cardiac enlargement without vascular congestion or edema. No focal consolidation in the lungs. No blunting of costophrenic angles. No pneumothorax. Degenerative changes in the spine and shoulders. IMPRESSION: Cardiac enlargement.  No evidence of active pulmonary disease. Electronically Signed   By: Lucienne Capers M.D.   On: 10/12/2016 23:55    ECG: Poor quality tracing, probable sinus  bradycardia, nonspecific minor IVCD  TELEMETRY: NSR alternating with SVT (PAT versus atrial flutter with 2:1 AV block)  IMPRESSION: 1. Sepsis: resolving 2. CHF: chronic reduction in EF 40%, no current evidence for CHF. Watch for worsening HF due to aggressive volume resuscitation during this admission. Will have to restart HF meds and diuretics gradually. Per Dr. Lysbeth Penner notes she was on Entresto. Med reconciliation lists captopril. There are notes regarding inability to afford the [facility]. Need family to bring meds to clarify. Valsartan preffered over captopril to allow return to [facility] in the future. 3. HTN: off all meds due to acute illness/hypotension. 4. Elevated troponin: marker of severity of acute illness/hypotension, not a true acute coronary sd. 5. Acute on chronic renal failure: slow and relatively mild deterioration, suspect will "turn around" soon. 6. PAT vs atrial flutter: poor quality tracings due to baseline artifact. Not  particularly tachycardiac during arrhythmic events. Due to suspicion for underlying amyloidosis, best to avoid digoxin. Consider beta blocker after extubation/ full hemodynamic recovery (abut may be limited by sinus bradycardia). In October she was on Carvedilol. By Nov 15 this is not listed, again not clear why.  RECOMMENDATION: 1. Supportive care with goal of extubation soon 2. Does not need anticoagulation for acute coronary sd, but will continue for now until we clarify the nature of her arrhythmia 3. No additional cardiac investigation planned on current admission  Time Spent Directly with Patient: 45 minutes  Sanda Klein, MD, [facility] [phone] office [phone] pager   10/14/2016, 9:02 AM

## 2016-10-14 NOTE — Progress Notes (Signed)
10/14/2016 4:00 PM  Mandy Rhodes [id number]  Hosp  Day 2    Temp:  [99 F (37.2 C)-100.8 F (38.2 C)] 99 F (37.2 C) (11/25 1500) Pulse Rate:  [79-134] 93 (11/25 1500) Resp:  [0-22] 19 (11/25 1500) BP: (78-112)/(35-66) 92/35 (11/25 1458) SpO2:  [98 %-100 %] 99 % (11/25 1500) Arterial Line BP: (82-111)/(35-59) 99/54 (11/25 1500) FiO2 (%):  [25 %-40 %] 25 % (11/25 1500) Weight:  [83.2 kg (183 lb 6.8 oz)] 83.2 kg (183 lb 6.8 oz) (11/25 0500),     Intake/Output Summary (Last 24 hours) at 10/14/16 1600 Last data filed at 10/14/16 1500  Gross per 24 hour  Intake          4164.06 ml  Output              770 ml  Net          3394.06 ml    Results for orders placed or performed during the hospital encounter of 10/12/16 (from the past 24 hour(s))  Troponin I     Status: Abnormal   Collection Time: 10/13/16  5:16 PM  Result Value Ref Range   Troponin I 1.09 (HH) <0.03 ng/mL  Heparin level (unfractionated)     Status: Abnormal   Collection Time: 10/13/16  5:16 PM  Result Value Ref Range   Heparin Unfractionated 0.92 (H) 0.30 - 0.70 IU/mL  Glucose, capillary     Status: None   Collection Time: 10/13/16  7:43 PM  Result Value Ref Range   Glucose-Capillary 85 65 - 99 mg/dL   Comment 1 Notify RN    Comment 2 Document in Chart   Basic metabolic panel     Status: Abnormal   Collection Time: 10/13/16  9:08 PM  Result Value Ref Range   Sodium 150 (H) 135 - 145 mmol/L   Potassium 3.5 3.5 - 5.1 mmol/L   Chloride 117 (H) 101 - 111 mmol/L   CO2 24 22 - 32 mmol/L   Glucose, Bld 94 65 - 99 mg/dL   BUN 60 (H) 6 - 20 mg/dL   Creatinine, Ser 1.97 (H) 0.44 - 1.00 mg/dL   Calcium 9.0 8.9 - 10.3 mg/dL   GFR calc non Af Amer 24 (L) >60 mL/min   GFR calc Af Amer 27 (L) >60 mL/min   Anion gap 9 5 - 15  CBC with Differential/Platelet     Status: Abnormal   Collection Time: 10/13/16  9:08 PM  Result Value Ref Range   WBC 7.6 4.0 - 10.5 K/uL   RBC 3.21 (L) 3.87 - 5.11 MIL/uL   Hemoglobin  9.3 (L) 12.0 - 15.0 g/dL   HCT 27.6 (L) 36.0 - 46.0 %   MCV 86.0 78.0 - 100.0 fL   MCH 29.0 26.0 - 34.0 pg   MCHC 33.7 30.0 - 36.0 g/dL   RDW 15.9 (H) 11.5 - 15.5 %   Platelets 156 150 - 400 K/uL   Neutrophils Relative % 78 %   Neutro Abs 6.0 1.7 - 7.7 K/uL   Lymphocytes Relative 13 %   Lymphs Abs 1.0 0.7 - 4.0 K/uL   Monocytes Relative 9 %   Monocytes Absolute 0.7 0.1 - 1.0 K/uL   Eosinophils Relative 0 %   Eosinophils Absolute 0.0 0.0 - 0.7 K/uL   Basophils Relative 0 %   Basophils Absolute 0.0 0.0 - 0.1 K/uL  Lactic acid, plasma     Status: None   Collection Time: 10/13/16  9:09 PM  Result Value Ref Range   Lactic Acid, Venous 1.4 0.5 - 1.9 mmol/L  Glucose, capillary     Status: None   Collection Time: 10/14/16 12:11 AM  Result Value Ref Range   Glucose-Capillary 94 65 - 99 mg/dL   Comment 1 Notify RN    Comment 2 Document in Chart   Glucose, capillary     Status: None   Collection Time: 10/14/16  3:57 AM  Result Value Ref Range   Glucose-Capillary 85 65 - 99 mg/dL  Magnesium     Status: None   Collection Time: 10/14/16  4:04 AM  Result Value Ref Range   Magnesium 1.8 1.7 - 2.4 mg/dL  Procalcitonin     Status: None   Collection Time: 10/14/16  4:04 AM  Result Value Ref Range   Procalcitonin <0.10 ng/mL  Renal function panel     Status: Abnormal   Collection Time: 10/14/16  4:04 AM  Result Value Ref Range   Sodium 150 (H) 135 - 145 mmol/L   Potassium 3.7 3.5 - 5.1 mmol/L   Chloride 117 (H) 101 - 111 mmol/L   CO2 24 22 - 32 mmol/L   Glucose, Bld 90 65 - 99 mg/dL   BUN 58 (H) 6 - 20 mg/dL   Creatinine, Ser 2.18 (H) 0.44 - 1.00 mg/dL   Calcium 9.0 8.9 - 10.3 mg/dL   Phosphorus 2.8 2.5 - 4.6 mg/dL   Albumin 2.5 (L) 3.5 - 5.0 g/dL   GFR calc non Af Amer 21 (L) >60 mL/min   GFR calc Af Amer 24 (L) >60 mL/min   Anion gap 9 5 - 15  CBC with Differential/Platelet     Status: Abnormal   Collection Time: 10/14/16  4:04 AM  Result Value Ref Range   WBC 6.2 4.0 - 10.5  K/uL   RBC 3.15 (L) 3.87 - 5.11 MIL/uL   Hemoglobin 9.1 (L) 12.0 - 15.0 g/dL   HCT 27.2 (L) 36.0 - 46.0 %   MCV 86.3 78.0 - 100.0 fL   MCH 28.9 26.0 - 34.0 pg   MCHC 33.5 30.0 - 36.0 g/dL   RDW 16.2 (H) 11.5 - 15.5 %   Platelets 156 150 - 400 K/uL   Neutrophils Relative % 75 %   Neutro Abs 4.7 1.7 - 7.7 K/uL   Lymphocytes Relative 15 %   Lymphs Abs 0.9 0.7 - 4.0 K/uL   Monocytes Relative 10 %   Monocytes Absolute 0.6 0.1 - 1.0 K/uL   Eosinophils Relative 0 %   Eosinophils Absolute 0.0 0.0 - 0.7 K/uL   Basophils Relative 0 %   Basophils Absolute 0.0 0.0 - 0.1 K/uL  Heparin level (unfractionated)     Status: Abnormal   Collection Time: 10/14/16  4:04 AM  Result Value Ref Range   Heparin Unfractionated 0.74 (H) 0.30 - 0.70 IU/mL  Glucose, capillary     Status: None   Collection Time: 10/14/16  8:04 AM  Result Value Ref Range   Glucose-Capillary 92 65 - 99 mg/dL  Glucose, capillary     Status: None   Collection Time: 10/14/16 11:33 AM  Result Value Ref Range   Glucose-Capillary 90 65 - 99 mg/dL  Heparin level (unfractionated)     Status: None   Collection Time: 10/14/16  3:00 PM  Result Value Ref Range   Heparin Unfractionated 0.66 0.30 - 0.70 IU/mL  Glucose, capillary     Status: None   Collection Time: 10/14/16  3:20 PM  Result Value Ref Range   Glucose-Capillary 98 65 - 99 mg/dL   Comment 1 Notify RN    Comment 2 Document in Chart     SUBJECTIVE:   Much more awake, and assisting vent to some degree.  Appears to understand conversation.   OBJECTIVE:  Attentive.  Still intubated.  Attempting to mouth words.    IMPRESSION:  Fairly rapid medical improvement.  Laryngeal tumor.   PLAN:  Had a detailed conversation with pt and several family  members.  Discussed presumptive diagnosis of laryngeal cancer, and possible treatment options.  Will need panenedoscopy and biopsy soon to establish diagnosis and staging.  If the tumor is obstructing, then she may need a tracheostomy to  allow extubation, and possibly also so she can tolerate swelling with Radiation treatment.  She nods that she is in favor of tracheostomy if felt necessary.   Awaiting Dr. Ammie Dalton medical  clearance for anesthesia.  Will need to be off Heparin for procedure.  May be logistically difficult to prepare for Monday, but may be able to accomplish this on Tuesday.   Risks and complications discussed with pt and family.    [name]

## 2016-10-14 NOTE — Progress Notes (Signed)
ANTICOAGULATION CONSULT NOTE - Follow Up Consult  Pharmacy Consult for Heparin Indication: chest pain/ACS  Allergies  Allergen Reactions  . Sulfa Antibiotics Swelling    Patient Measurements: Height: 5\' 6"  (167.6 cm) Weight: 179 lb 7.3 oz (81.4 kg) IBW/kg (Calculated) : 59.3 Heparin Dosing Weight:   Vital Signs: Temp: 100.2 F (37.9 C) (11/25 0325) Temp Source: Core (Comment) (11/25 0000) BP: 90/55 (11/25 0325) Pulse Rate: 128 (11/25 0325)  Labs:  Recent Labs  10/12/16 2302 10/13/16 0058 10/13/16 0310 10/13/16 0534 10/13/16 1033 10/13/16 1716 10/13/16 2108 10/14/16 0404  HGB 12.3  --   --  10.6*  --   --  9.3* 9.1*  HCT 38.3  --   --  32.4*  --   --  27.6* 27.2*  PLT 179  --   --  179  --   --  156 156  APTT  --  32  --   --   --   --   --   --   LABPROT  --  15.5*  --   --   --   --   --   --   INR  --  1.22  --   --   --   --   --   --   HEPARINUNFRC  --   --   --   --  0.55 0.92*  --  0.74*  CREATININE 1.84*  --   --  1.85*  --   --  1.97*  --   TROPONINI 0.53*  --  0.64*  --  0.87* 1.09*  --   --     Estimated Creatinine Clearance: 26.5 mL/min (by C-G formula based on SCr of 1.97 mg/dL (H)).   Medications:  Infusions:  . heparin 850 Units/hr (10/13/16 1949)    Assessment: Patient with still heparin level.  No heparin issues per RN.  Goal of Therapy:  Heparin level 0.3-0.7 units/ml Monitor platelets by anticoagulation protocol: Yes   Plan:  Decrease heparin to 750 units/hr Recheck level at [address], [place] 10/14/2016,4:37 AM

## 2016-10-14 NOTE — Progress Notes (Signed)
[facility]in for Heparin Indication: arrhythmia (see below)  Medications:  Infusions:  . feeding supplement (VITAL AF 1.2 CAL) 1,000 mL (10/14/16 1559)  . heparin 750 Units/hr (10/14/16 0436)    Assessment: Patient with + troponin 11/24, and MD wants heparin per pharmacy for possible ACS. Per Cardiology consult note today, does not need anticoagulation for ACS, but will continue for now until nature of her arrhythmia clarified.   Today, 10/14/16:  1500 HL = 0.66, now therapeutic on heparin infusion at 750 units/hr  CBC: Hgb slightly decreased to 9.1, Pltc WNL  No bleeding or line issues reported per RN  Goal of Therapy:  Heparin level 0.3-0.7 units/ml Monitor platelets by anticoagulation protocol: Yes   Plan:   Continue heparin infusion at 750 units/hr  Re-check confirmatory heparin level at 2300.  Daily heparin level and CBC while on heparin infusion.  Monitor closely for s/sx of bleeding.  F/u plans on timing of procedures for laryngeal tumor (panendoscopy, biopsy, etc).    Lindell Spar, PharmD, BCPS Pager: [phone] 10/14/2016 4:25 PM

## 2016-10-15 LAB — RENAL FUNCTION PANEL
ALBUMIN: 2.5 g/dL — AB (ref 3.5–5.0)
ANION GAP: 7 (ref 5–15)
BUN: 61 mg/dL — AB (ref 6–20)
CALCIUM: 9.2 mg/dL (ref 8.9–10.3)
CO2: 24 mmol/L (ref 22–32)
CREATININE: 2.22 mg/dL — AB (ref 0.44–1.00)
Chloride: 117 mmol/L — ABNORMAL HIGH (ref 101–111)
GFR calc Af Amer: 24 mL/min — ABNORMAL LOW (ref >60)
GFR calc non Af Amer: 20 mL/min — ABNORMAL LOW (ref >60)
GLUCOSE: 203 mg/dL — AB (ref 65–99)
PHOSPHORUS: 3.2 mg/dL (ref 2.5–4.6)
Potassium: 3.4 mmol/L — ABNORMAL LOW (ref 3.5–5.1)
SODIUM: 148 mmol/L — AB (ref 135–145)

## 2016-10-15 LAB — PROCALCITONIN

## 2016-10-15 LAB — CBC WITH DIFFERENTIAL/PLATELET
BASOS ABS: 0 10*3/uL (ref 0.0–0.1)
BASOS PCT: 0 %
EOS PCT: 1 %
Eosinophils Absolute: 0.1 10*3/uL (ref 0.0–0.7)
HEMATOCRIT: 26.5 % — AB (ref 36.0–46.0)
Hemoglobin: 8.8 g/dL — ABNORMAL LOW (ref 12.0–15.0)
Lymphocytes Relative: 15 %
Lymphs Abs: 1 10*3/uL (ref 0.7–4.0)
MCH: 28.5 pg (ref 26.0–34.0)
MCHC: 33.2 g/dL (ref 30.0–36.0)
MCV: 85.8 fL (ref 78.0–100.0)
MONO ABS: 0.8 10*3/uL (ref 0.1–1.0)
Monocytes Relative: 12 %
Neutro Abs: 4.5 10*3/uL (ref 1.7–7.7)
Neutrophils Relative %: 72 %
Platelets: 127 10*3/uL — ABNORMAL LOW (ref 150–400)
RBC: 3.09 MIL/uL — ABNORMAL LOW (ref 3.87–5.11)
RDW: 16.5 % — AB (ref 11.5–15.5)
WBC: 6.4 10*3/uL (ref 4.0–10.5)

## 2016-10-15 LAB — GLUCOSE, CAPILLARY
GLUCOSE-CAPILLARY: 179 mg/dL — AB (ref 65–99)
GLUCOSE-CAPILLARY: 157 mg/dL — AB (ref 65–99)
Glucose-Capillary: 151 mg/dL — ABNORMAL HIGH (ref 65–99)
Glucose-Capillary: 178 mg/dL — ABNORMAL HIGH (ref 65–99)
Glucose-Capillary: 177 mg/dL — ABNORMAL HIGH (ref 65–99)

## 2016-10-15 LAB — TYPE AND SCREEN
ABO/RH(D): A POS
Antibody Screen: NEGATIVE

## 2016-10-15 LAB — ABO/RH: ABO/RH(D): A POS

## 2016-10-15 LAB — MAGNESIUM: MAGNESIUM: 2.4 mg/dL (ref 1.7–2.4)

## 2016-10-15 MED ORDER — LACTATED RINGERS IV SOLN
INTRAVENOUS | Status: AC
  Administered 2016-10-15 (×3): via INTRAVENOUS

## 2016-10-15 MED ORDER — ONDANSETRON HCL 4 MG/2ML IJ SOLN
4.0000 mg | Freq: Three times a day (TID) | INTRAMUSCULAR | Status: DC | PRN
  Administered 2016-10-15 – 2016-10-20 (×3): 4 mg via INTRAVENOUS
  Filled 2016-10-15 (×3): qty 2

## 2016-10-15 MED ORDER — HEPARIN SODIUM (PORCINE) 5000 UNIT/ML IJ SOLN
5000.0000 [IU] | Freq: Three times a day (TID) | INTRAMUSCULAR | Status: DC
  Administered 2016-10-15 – 2016-10-16 (×6): 5000 [IU] via SUBCUTANEOUS
  Filled 2016-10-15 (×7): qty 1

## 2016-10-15 NOTE — Progress Notes (Signed)
10/15/2016 2:59 PM  Mandy Rhodes [id number]  hosp  Day 3    Temp:  [97.7 F (36.5 C)-99 F (37.2 C)] 97.7 F (36.5 C) (11/26 1100) Pulse Rate:  [66-114] 108 (11/26 1100) Resp:  [8-20] 16 (11/26 1100) BP: (95-106)/(53-57) 95/55 (11/26 0305) SpO2:  [97 %-100 %] 100 % (11/26 1100) Arterial Line BP: (98-117)/(51-65) 104/59 (11/26 0900) FiO2 (%):  [25 %-30 %] 30 % (11/26 1051) Weight:  [85.6 kg (188 lb 11.4 oz)] 85.6 kg (188 lb 11.4 oz) (11/26 0600),     Intake/Output Summary (Last 24 hours) at 10/15/16 1459 Last data filed at 10/15/16 0600  Gross per 24 hour  Intake          1464.84 ml  Output              480 ml  Net           984.84 ml    Results for orders placed or performed during the hospital encounter of 10/12/16 (from the past 24 hour(s))  Heparin level (unfractionated)     Status: None   Collection Time: 10/14/16  3:00 PM  Result Value Ref Range   Heparin Unfractionated 0.66 0.30 - 0.70 IU/mL  Glucose, capillary     Status: None   Collection Time: 10/14/16  3:20 PM  Result Value Ref Range   Glucose-Capillary 98 65 - 99 mg/dL   Comment 1 Notify RN    Comment 2 Document in Chart   Glucose, capillary     Status: Abnormal   Collection Time: 10/14/16  8:26 PM  Result Value Ref Range   Glucose-Capillary 122 (H) 65 - 99 mg/dL   Comment 1 Notify RN    Comment 2 Document in Chart   Heparin level (unfractionated)     Status: None   Collection Time: 10/14/16 11:00 PM  Result Value Ref Range   Heparin Unfractionated 0.63 0.30 - 0.70 IU/mL  Glucose, capillary     Status: Abnormal   Collection Time: 10/14/16 11:25 PM  Result Value Ref Range   Glucose-Capillary 134 (H) 65 - 99 mg/dL   Comment 1 Notify RN    Comment 2 Document in Chart   Glucose, capillary     Status: Abnormal   Collection Time: 10/15/16  3:49 AM  Result Value Ref Range   Glucose-Capillary 151 (H) 65 - 99 mg/dL   Comment 1 Notify RN    Comment 2 Document in Chart   Magnesium     Status: None    Collection Time: 10/15/16  6:45 AM  Result Value Ref Range   Magnesium 2.4 1.7 - 2.4 mg/dL  Procalcitonin     Status: None   Collection Time: 10/15/16  6:45 AM  Result Value Ref Range   Procalcitonin <0.10 ng/mL  Renal function panel     Status: Abnormal   Collection Time: 10/15/16  6:45 AM  Result Value Ref Range   Sodium 148 (H) 135 - 145 mmol/L   Potassium 3.4 (L) 3.5 - 5.1 mmol/L   Chloride 117 (H) 101 - 111 mmol/L   CO2 24 22 - 32 mmol/L   Glucose, Bld 203 (H) 65 - 99 mg/dL   BUN 61 (H) 6 - 20 mg/dL   Creatinine, Ser 2.22 (H) 0.44 - 1.00 mg/dL   Calcium 9.2 8.9 - 10.3 mg/dL   Phosphorus 3.2 2.5 - 4.6 mg/dL   Albumin 2.5 (L) 3.5 - 5.0 g/dL   GFR calc non Af Amer 20 (L) >60  mL/min   GFR calc Af Amer 24 (L) >60 mL/min   Anion gap 7 5 - 15  CBC with Differential/Platelet     Status: Abnormal   Collection Time: 10/15/16  6:45 AM  Result Value Ref Range   WBC 6.4 4.0 - 10.5 K/uL   RBC 3.09 (L) 3.87 - 5.11 MIL/uL   Hemoglobin 8.8 (L) 12.0 - 15.0 g/dL   HCT 26.5 (L) 36.0 - 46.0 %   MCV 85.8 78.0 - 100.0 fL   MCH 28.5 26.0 - 34.0 pg   MCHC 33.2 30.0 - 36.0 g/dL   RDW 16.5 (H) 11.5 - 15.5 %   Platelets 127 (L) 150 - 400 K/uL   Neutrophils Relative % 72 %   Neutro Abs 4.5 1.7 - 7.7 K/uL   Lymphocytes Relative 15 %   Lymphs Abs 1.0 0.7 - 4.0 K/uL   Monocytes Relative 12 %   Monocytes Absolute 0.8 0.1 - 1.0 K/uL   Eosinophils Relative 1 %   Eosinophils Absolute 0.1 0.0 - 0.7 K/uL   Basophils Relative 0 %   Basophils Absolute 0.0 0.0 - 0.1 K/uL  Glucose, capillary     Status: Abnormal   Collection Time: 10/15/16  8:15 AM  Result Value Ref Range   Glucose-Capillary 178 (H) 65 - 99 mg/dL   Comment 1 Notify RN    Comment 2 Document in Chart   Type and screen     Status: None   Collection Time: 10/15/16  9:55 AM  Result Value Ref Range   ABO/RH(D) A POS    Antibody Screen NEG    Sample Expiration 10/18/2016   ABO/Rh     Status: None   Collection Time: 10/15/16  9:55 AM   Result Value Ref Range   ABO/RH(D) A POS   Glucose, capillary     Status: Abnormal   Collection Time: 10/15/16 12:20 PM  Result Value Ref Range   Glucose-Capillary 179 (H) 65 - 99 mg/dL   Comment 1 Notify RN    Comment 2 Document in Chart     IMPRESSION:  Medically cleared for endoscopy and biopsy.  Will need to be off heparin.  I will begin coordinating with OR for times.  PLAN:  For OR when cleared per Anesthesia and time available.  Had discussed with pt and family at length yesterday.   [name]

## 2016-10-15 NOTE — Progress Notes (Signed)
Patient Name: Mandy Rhodes Date of Encounter: 10/15/2016  Primary Cardiologist: [facility] Problem List     Active Problems:   Essential hypertension   Type 2 diabetes mellitus with stage 3 chronic kidney disease, without long-term current use of insulin (HCC)   Chronic combined systolic and diastolic heart failure (HCC)   Respiratory failure (HCC)   AKI (acute kidney injury) ([place])   Pressure injury of skin   SVT (supraventricular tachycardia) (HCC)   Malnutrition of moderate degree     Subjective   Alert, calm comfortable. She is aware of palpitations when she has PAT. Telemetry shows very frequent but brief bursts of PAT with rates now at 115-112 bpm. No atrial flutter or atrial fibrillation is seen. No current plans for extubation - ENT for throat mass biopsy and tracheostomy early this week.  Inpatient Medications    Scheduled Meds: . ampicillin-sulbactam (UNASYN) IV  3 g Intravenous Q12H  . aspirin  81 mg Per Tube Daily  . chlorhexidine gluconate (MEDLINE KIT)  15 mL Mouth Rinse BID  . ferrous sulfate  325 mg Oral Daily  . free water  200 mL Per Tube Q8H  . insulin aspart  0-9 Units Subcutaneous Q4H  . ipratropium  0.5 mg Nebulization Q6H  . levalbuterol  0.63 mg Nebulization Q6H  . mouth rinse  15 mL Mouth Rinse QID  . pantoprazole sodium  40 mg Per Tube Daily  . pravastatin  40 mg Oral Daily   Continuous Infusions: . feeding supplement (VITAL AF 1.2 CAL) 1,000 mL (10/15/16 0000)  . heparin 750 Units/hr (10/15/16 0119)  . lactated ringers     PRN Meds: sodium chloride, acetaminophen, fentaNYL (SUBLIMAZE) injection, midazolam   Vital Signs    Vitals:   10/15/16 0500 10/15/16 0600 10/15/16 0756 10/15/16 0757  BP:      Pulse: 66 (!) 110    Resp: 14 16    Temp: 98.1 F (36.7 C) 97.7 F (36.5 C)    TempSrc:      SpO2: 100% 100% 100% 100%  Weight:  188 lb 11.4 oz (85.6 kg)    Height:        Intake/Output Summary (Last 24 hours) at 10/15/16  0847 Last data filed at 10/15/16 0600  Gross per 24 hour  Intake          2694.84 ml  Output              535 ml  Net          2159.84 ml   Filed Weights   10/13/16 0444 10/14/16 0500 10/15/16 0600  Weight: 179 lb 7.3 oz (81.4 kg) 183 lb 6.8 oz (83.2 kg) 188 lb 11.4 oz (85.6 kg)    Physical Exam   Intubated, but alert and able to communicate GEN: Well nourished, well developed, in no acute distress.  HEENT: Grossly normal.  Neck: Supple, no JVD, carotid bruits, or masses. Cardiac: RRR, no murmurs, rubs, or gallops. No clubbing, cyanosis, edema.  Radials/DP/PT 2+ and equal bilaterally.  Respiratory:  Respirations regular and unlabored, clear to auscultation bilaterally anteriorly. GI: Soft, nontender, nondistended, BS + x 4. MS: no deformity or atrophy. Skin: warm and dry, no rash. Neuro:  Strength and sensation are intact.   Labs    CBC  Recent Labs  10/14/16 0404 10/15/16 0645  WBC 6.2 6.4  NEUTROABS 4.7 4.5  HGB 9.1* 8.8*  HCT 27.2* 26.5*  MCV 86.3 85.8  PLT 156 127*  Basic Metabolic Panel  Recent Labs  10/13/16 0534 10/13/16 2108 10/14/16 0404  NA 148* 150* 150*  K 3.0* 3.5 3.7  CL 111 117* 117*  CO2 '28 24 24  ' GLUCOSE 122* 94 90  BUN 58* 60* 58*  CREATININE 1.85* 1.97* 2.18*  CALCIUM 9.5 9.0 9.0  MG 1.8  --  1.8  PHOS 2.6  --  2.8   Liver Function Tests  Recent Labs  10/12/16 2302 10/14/16 0404  AST 33  --   ALT 30  --   ALKPHOS 97  --   BILITOT 1.0  --   PROT 8.0  --   ALBUMIN 3.3* 2.5*   No results for input(s): LIPASE, AMYLASE in the last 72 hours. Cardiac Enzymes  Recent Labs  10/13/16 0310 10/13/16 1033 10/13/16 1716  TROPONINI 0.64* 0.87* 1.09*   BNP Invalid input(s): POCBNP D-Dimer No results for input(s): DDIMER in the last 72 hours. Hemoglobin A1C  Recent Labs  10/13/16 0534  HGBA1C 7.1*   Fasting Lipid Panel No results for input(s): CHOL, HDL, LDLCALC, TRIG, CHOLHDL, LDLDIRECT in the last 72 hours. Thyroid  Function Tests No results for input(s): TSH, T4TOTAL, T3FREE, THYROIDAB in the last 72 hours.  Invalid input(s): FREET3  Telemetry    NSR with frequent bursts of ectopic atrial tachycardia - Personally Reviewed  Radiology    Ct Head Wo Contrast  Result Date: 10/13/2016 CLINICAL DATA:  Unresponsive patient. Patient complained of breath and generalized weakness for 2 weeks. EXAM: CT HEAD WITHOUT CONTRAST TECHNIQUE: Contiguous axial images were obtained from the base of the skull through the vertex without intravenous contrast. COMPARISON:  None. FINDINGS: Brain: No evidence of acute infarction, hemorrhage, hydrocephalus, extra-axial collection or mass lesion/mass effect. There is moderate brain parenchymal volume loss and symmetric bilateral microangiopathy. Vascular: Atherosclerotic disease at the skullbase. Skull: Normal. Negative for fracture or focal lesion. Sinuses/Orbits: No acute finding. Other: None. IMPRESSION: No acute intracranial abnormality. Moderate brain parenchymal atrophy and chronic microvascular disease. Electronically Signed   By: Fidela Salisbury M.D.   On: 10/13/2016 12:35    Cardiac Studies  11/24 - Left ventricle: The cavity size was normal. There was moderate   concentric hypertrophy. The appearance was consistent with   hypertensive changes or infiltrative cardiomyopathy, such as   amyloidosis. Systolic function was moderately to severely   reduced. The estimated ejection fraction was in the range of 30%   to 35%. Moderate diffuse hypokinesis with no identifiable   regional variations. Features are consistent with a pseudonormal   left ventricular filling pattern, with concomitant abnormal   relaxation and increased filling pressure (grade 2 diastolic   dysfunction). - Mitral valve: Valve area by pressure half-time: 2.42 cm^2. - Left atrium: The atrium was moderately dilated. - Pulmonary arteries: Systolic pressure was mildly increased. PA   peak pressure:  39 mm Hg (S).   Patient Profile     75 year old with CHF due to infiltrative cardiomyopathy, minimal CAD by cath, presumed cardiac amyloidosis, admitted with sepsis/respiratory failure due to LLL pneumonia, complicated by presence of laryngeal mass, recurrent PAT and mild acute on chronic renal failure.  Assessment & Plan    1. Sepsis: resolving rapidly, BP still on low end.  2. CHF/suspected cardiac amyloidosis: chronic reduction in EF 40%, no current evidence for acute CHF. Watch for worsening HF due to aggressive volume resuscitation during this admission. Will have to restart HF meds and diuretics gradually. Valsartan preffered over captopril to allow return to  Entresto in the future (stopped due to cost). Even if fat biopsy was negative, does not exclude cardiac amyloidosis.  3. HTN: off all meds due to acute illness/hypotension. 4. Elevated troponin: marker of severity of acute illness/hypotension, not a true acute coronary sd. 5. Acute on chronic renal failure: slow and relatively mild deterioration, suspect will "turn around" soon.  6. PAT : currently not on meds due to low BP. Due to suspicion for underlying amyloidosis, best to avoid digoxin. Consider beta blocker after extubation/ full hemodynamic recovery (but may be limited by sinus bradycardia). In October she was on Carvedilol. By Nov 15 this is not listed, again not clear why. Would restart carvedilol 3.125 mg BID when BP allows. 7. Hypernatremia: improving, free water via NGT. 8. Possible laryngeal cancer: for surgery with Dr. Erik Obey in 1-2 days.  Signed, Sanda Klein, MD  10/15/2016, 8:47 AM

## 2016-10-15 NOTE — Progress Notes (Signed)
ANTICOAGULATION CONSULT NOTE - Follow Up Consult  Pharmacy Consult for Heparin Indication: arrhythmia   Allergies  Allergen Reactions  . Sulfa Antibiotics Swelling    Patient Measurements: Height: 5\' 6"  (167.6 cm) Weight: 183 lb 6.8 oz (83.2 kg) IBW/kg (Calculated) : 59.3 Heparin Dosing Weight:   Vital Signs: Temp: 98.4 F (36.9 C) (11/26 0200) Temp Source: Core (Comment) (11/26 0000) BP: 95/55 (11/26 0305) Pulse Rate: 77 (11/26 0305)  Labs:  Recent Labs  10/13/16 0058 10/13/16 0310 10/13/16 0534  10/13/16 1033 10/13/16 1716 10/13/16 2108 10/14/16 0404 10/14/16 1500 10/14/16 2300  HGB  --   --  10.6*  --   --   --  9.3* 9.1*  --   --   HCT  --   --  32.4*  --   --   --  27.6* 27.2*  --   --   PLT  --   --  179  --   --   --  156 156  --   --   APTT 32  --   --   --   --   --   --   --   --   --   LABPROT 15.5*  --   --   --   --   --   --   --   --   --   INR 1.22  --   --   --   --   --   --   --   --   --   HEPARINUNFRC  --   --   --   < > 0.55 0.92*  --  0.74* 0.66 0.63  CREATININE  --   --  1.85*  --   --   --  1.97* 2.18*  --   --   TROPONINI  --  0.64*  --   --  0.87* 1.09*  --   --   --   --   < > = values in this interval not displayed.  Estimated Creatinine Clearance: 24.3 mL/min (by C-G formula based on SCr of 2.18 mg/dL (H)).   Medications:  Infusions:  . feeding supplement (VITAL AF 1.2 CAL) 1,000 mL (10/15/16 0000)  . heparin 750 Units/hr (10/15/16 0119)    Assessment: Patient with heparin level at goal.  No heparin issues noted.  Goal of Therapy:  Heparin level 0.3-0.7 units/ml Monitor platelets by anticoagulation protocol: Yes   Plan:  Continue heparin drip at current rate Recheck level with 11/26 AM labs  Tyler Deis, Irja [place] 10/15/2016,3:21 AM

## 2016-10-15 NOTE — Progress Notes (Signed)
PULMONARY / CRITICAL CARE MEDICINE   Name: Mandy Rhodes MRN: [id number] DOB: May 18, 1941    ADMISSION DATE:  10/12/2016 CONSULTATION DATE:  10/13/2016  REFERRING MD:  Dr. Roxanne Mins EDP  CHIEF COMPLAINT:  Shortness of breath  HISTORY OF PRESENT ILLNESS:  75 y.o. female with past medical history as below, which is significant for diabetes, CHF, chronic kidney disease, and hypertension. She presented to [facility] emergency department 10/12/2016 with complaints of shortness of breath and generalized weakness for about 2 weeks. She has additionally suffered a 30 pound weight loss over some recent period of time which is not entirely clear. Recently treated with antibiotics as an outpatient for suspected bronchitis vs pneumonia.Upon arrival to the emergency department she was on 4 L of supplemental O2 via nasal cannula and still had increased work of breathing.due to her somnolence ABG was ordered which showed  Hypercapnic respiratory failure and she was started on BiPAP. She was known to be a DO NOT RESUSCITATE, however,  Family has decided to proceed with intubation, which was done in the emergency department. During intubation a supraglottic mass was noted by emergency room provider which made intubation difficult. Laboratory evaluation significant forserum creatinine 1.8, calcium 10.5, troponin 0.53, WBC 12, glucose 137.PCCM asked to admit  SUBJECTIVE:  No acute events overnight. Patient denies any pain, nausea, or difficulty breathing with a head nod.  REVIEW OF SYSTEMS:  Unable to obtain due to intubation.  VITAL SIGNS: BP (!) 95/55   Pulse (!) 110   Temp 97.7 F (36.5 C)   Resp 16   Ht 5\' 6"  (1.676 m)   Wt 188 lb 11.4 oz (85.6 kg)   SpO2 100%   BMI 30.46 kg/m   HEMODYNAMICS:    VENTILATOR SETTINGS: Vent Mode: PRVC FiO2 (%):  [25 %-40 %] 30 % Set Rate:  [14 bmp] 14 bmp Vt Set:  [470 mL] 470 mL PEEP:  [5 cmH20] 5 cmH20 Pressure Support:  [5 cmH20-10 cmH20] 10  cmH20 Plateau Pressure:  [17 cmH20-18 cmH20] 17 cmH20  INTAKE / OUTPUT: I/O last 3 completed shifts: In: 5481.4 [I.V.:526.6; Other:1620; NG/GT:934.8; IV Piggyback:2400] Out: [id number] [Urine:750; Emesis/NG output:125]  PHYSICAL EXAMINATION: General:  Eyes open. Family at bedside. No distress. Neuro:  Following commands. Nods to questions. Moving all 4 extremities equally. HEENT:  Endotracheal tube in place. No scleral icterus or injection. Cardiovascular:  Regular rate and rhythm. Sinus rhythm on telemetry. No edema. No appreciable JVD. Pulmonary: Clear with auscultation bilaterally. Symmetric chest wall rise on ventilator. Abdomen:  Soft. Protuberant. Normal bowel sounds. Musculoskeletal:  No joint effusion or deformity appreciated. Skin:  Warm and dry. no rash on exposed skin.  LABS:  BMET  Recent Labs Lab 10/13/16 0534 10/13/16 2108 10/14/16 0404  NA 148* 150* 150*  K 3.0* 3.5 3.7  CL 111 117* 117*  CO2 28 24 24   BUN 58* 60* 58*  CREATININE 1.85* 1.97* 2.18*  GLUCOSE 122* 94 90    Electrolytes  Recent Labs Lab 10/13/16 0534 10/13/16 2108 10/14/16 0404  CALCIUM 9.5 9.0 9.0  MG 1.8  --  1.8  PHOS 2.6  --  2.8    CBC  Recent Labs Lab 10/13/16 2108 10/14/16 0404 10/15/16 0645  WBC 7.6 6.2 6.4  HGB 9.3* 9.1* 8.8*  HCT 27.6* 27.2* 26.5*  PLT 156 156 127*    Coag's  Recent Labs Lab 10/13/16 0058  APTT 32  INR 1.22    Sepsis Markers  Recent Labs Lab 10/13/16  0310 10/13/16 0534 10/13/16 2109 10/14/16 0404  LATICACIDVEN 2.4* 1.8 1.4  --   PROCALCITON <0.10  --   --  <0.10    ABG  Recent Labs Lab 10/12/16 2324 10/13/16 0202 10/13/16 0443  PHART 7.225* 7.539* 7.509*  PCO2ART 78.0* 29.8* 33.5  PO2ART 230* 51.1* 53.4*    Liver Enzymes  Recent Labs Lab 10/12/16 2302 10/14/16 0404  AST 33  --   ALT 30  --   ALKPHOS 97  --   BILITOT 1.0  --   ALBUMIN 3.3* 2.5*    Cardiac Enzymes  Recent Labs Lab 10/13/16 0310 10/13/16 1033  10/13/16 1716  TROPONINI 0.64* 0.87* 1.09*    Glucose  Recent Labs Lab 10/14/16 0804 10/14/16 1133 10/14/16 1520 10/14/16 2026 10/14/16 2325 10/15/16 0349  GLUCAP 92 90 98 122* 134* 151*    Imaging No results found.  STUDIES:  CT CHEST/NECK/SOFT TISSUE W/O 11/24: IMPRESSION: 1. Fluid and debris throughout the oropharynx, hypopharynx, and supraglottic airway from intubation. Suboptimal evaluation of the mucosa due to airway opacification and lack of contrast. Follow-up contrast examination after extubation and/or direct visualization recommended for evaluation of the supraglottic mass. 2. No cervical lymphadenopathy identified. 3. Left lower lobe consolidation and nodular opacities throughout the lungs compatible with pneumonia. Small left pleural effusion. 4. Endotracheal tube 1 cm from carina, retraction recommended. 5. Enteric tube tip below the field of view in the stomach. 6. Moderate coronary artery calcifications. 7. Borderline main pulmonary artery may represent pulmonary artery hypertension. CT HEAD W/O 11/24:  No acute intracranial abnormality. Moderate brain parenchymal atrophy and chronic microvascular disease. TTE 11/24: LV normal in size with EF 30-35%. Moderate concentric LVH. Appearance consistent with infiltrative cardiomyopathy. Moderate diffuse hypokinesis without regional variation. Grade 2 diastolic dysfunction. LA moderately dilated & RA normal in size. RV normal in size and function. No aortic stenosis or regurgitation. Aortic root normal in size. Trivial mitral regurgitation without stenosis. No pulmonic regurgitation. Mild tricuspid regurgitation. No pericardial effusion.  MICROBIOLOGY: MRSA PCR 11/24:  Negative Blood Ctx x2 11/24 >> Urine Legionella Ag 11/24:  Negative  Urine Streptococcal Ag 11/24:  Negative Respiratory Viral Panel PCR 11/24 (Tracheal):  Negative Tracheal Aspirate Ctx 11/24 >>  ANTIBIOTICS: Unasyn 11/24 >>   SIGNIFICANT  EVENTS: 11/24 - intubated, admitted to ICU.  LINES/TUBES: OETT 7.5 11/23 >> OGT 11/23 >> FOLEY 11/23 >> PIV  ASSESSMENT / PLAN:  PULMONARY A: Acute Hypercarbic Respiratory Failure Severe Community Acquired Pneumonia Left Lower Lobe Supraglottic Mass - Seen on intubation. Not appreciated on Neck CT w/o.  H/O Tobacco Use - Cigarettes & chewing tobacco  P:   Full ventilator support Intermittent ABG & Port CXR Vap Bundle Xopenex & Atrovent Nebs q6hr Scheduled ENT planning on biopsy & possible tracheostomy early this week  CARDIOVASCULAR A:  NSTEMI/Elevated Troponin I - S/P 300mg  Rectal ASA. SVT - Seen since admission. Borderline Hypotension - Likely due to hypovolemia.  Probable Infiltrative Cardiomyopathy - Seen on TTE. Question amyloidosis.  H/O CHF H/O HTN H/o Hyperlipidemia  P:  Continuous Telemetry Monitoring Vitals per Unit Protocol Heparin gtt per Pharmacy Protocol now for arrhythmia  Cardiology Consulted & appreciate recommendations ASA 81mg  VT daily Continue home captopril, pravastatin Holding home Lasix Plan to hold Heparin drip around time of biopsy/procedure  RENAL A:   Acute Renal Failure - Worsening. Lactic Acidosis - Resolved. Mild. Hypokalemia - Resolved. Hypernatremia - Stable. Hyperchloremia - Stable. H/O CKD Stage   P:   Trending UOP with Foley Monitoring  renal function & electrolytes daily Replacing electrolytes as indicated Free Water 200cc VT q8hr Awaiting AM labs  GASTROINTESTINAL A:   Moderate Protein-Calorie Malnutrition H/O GERD  P:   NPO Protonix VT daily Tube Feedings per dietary consult  HEMATOLOGIC/ONCOLOGIC A:   Anemia - No signs of active bleeding & hgb stable. Chronic w/ Hgb 10.4-11.1. Supraglottic Mass - Not appreciated w/ noncontrast neck CT.  P:  Trending Cell Counts daily w/ CBC Heparin gtt per Pharmacy Protocol  ENT Consulted & appreciate assistance FeSO4 daily Plan for biopsy early this week & possible  tracheostomy Ordering type and screen in anticipation of surgery  INFECTIOUS A:   Possible Sepsis Possible Severe Community Acquired Pneumonia Left Lower Lobe  P:   Empiric Unasyn Day #3 Trending Procalcitonin per Algorithm Awaiting Culture Results & urine legionella antigen result  ENDOCRINE A:   H/O Diabetes Mellitus Type 2 - A1c 7.2.  P:   SSI per Sensitive Algorithm Accu-Checks q4hr Holding home glimepiride  NEUROLOGIC A:   Acute Encephalopathy - Likely secondary to hypercarbia vs toxic metabolic. Sedation on Ventilator H/O Neuropathy - Secondary to diabetes.  P:   RASS goal: 0 to -1 Versed IV prn Sedation Fentanyl IV prn Pain/Discomfort. Holding home Nortriptyline   FAMILY  - Updates: Family updated by Dr. Ashok Cordia & Dr. Erik Obey  [id number] at bedside.  - Inter-disciplinary family meet or Palliative Care meeting due by:  11/30  TODAY'S SUMMARY:  75 y.o. female known to have chronic kidney disease not on dialysis, known to have chronic systolic and diastolic congestive heart failure, presenting with 2 week history of shortness of breath, worsening dysphagia. Continuing broad-spectrum antibiotics for the acquired pneumonia while awaiting culture results. Continuing nutritional support. Awaiting a.m. labs. From my standpoint the patient is medically stable for biopsy and possible tracheostomy as soon as Dr. Erik Obey feels this can be done.  I have spent a total of 32 minutes of critical care time today caring for the patient and reviewing the patient's electronic medical record.  Sonia Baller Ashok Cordia, M.D. [facility] Pulmonary & Critical Care Pager:  [phone] After 3pm or if no response, call [phone] 10/15/2016, 7:48 AM

## 2016-10-16 ENCOUNTER — Inpatient Hospital Stay (HOSPITAL_COMMUNITY): Payer: Medicare Other

## 2016-10-16 DIAGNOSIS — E870 Hyperosmolality and hypernatremia: Secondary | ICD-10-CM

## 2016-10-16 DIAGNOSIS — I428 Other cardiomyopathies: Secondary | ICD-10-CM

## 2016-10-16 DIAGNOSIS — J9600 Acute respiratory failure, unspecified whether with hypoxia or hypercapnia: Secondary | ICD-10-CM

## 2016-10-16 DIAGNOSIS — J189 Pneumonia, unspecified organism: Secondary | ICD-10-CM

## 2016-10-16 DIAGNOSIS — D649 Anemia, unspecified: Secondary | ICD-10-CM

## 2016-10-16 DIAGNOSIS — I248 Other forms of acute ischemic heart disease: Secondary | ICD-10-CM

## 2016-10-16 DIAGNOSIS — J387 Other diseases of larynx: Secondary | ICD-10-CM

## 2016-10-16 DIAGNOSIS — E876 Hypokalemia: Secondary | ICD-10-CM

## 2016-10-16 DIAGNOSIS — A419 Sepsis, unspecified organism: Secondary | ICD-10-CM

## 2016-10-16 LAB — RENAL FUNCTION PANEL
Albumin: 2.3 g/dL — ABNORMAL LOW (ref 3.5–5.0)
Anion gap: 6 (ref 5–15)
BUN: 60 mg/dL — AB (ref 6–20)
CHLORIDE: 117 mmol/L — AB (ref 101–111)
CO2: 26 mmol/L (ref 22–32)
CREATININE: 2.12 mg/dL — AB (ref 0.44–1.00)
Calcium: 9.2 mg/dL (ref 8.9–10.3)
GFR calc Af Amer: 25 mL/min — ABNORMAL LOW (ref >60)
GFR calc non Af Amer: 22 mL/min — ABNORMAL LOW (ref >60)
Glucose, Bld: 198 mg/dL — ABNORMAL HIGH (ref 65–99)
POTASSIUM: 3.6 mmol/L (ref 3.5–5.1)
Phosphorus: 2.4 mg/dL — ABNORMAL LOW (ref 2.5–4.6)
Sodium: 149 mmol/L — ABNORMAL HIGH (ref 135–145)

## 2016-10-16 LAB — BLOOD GAS, ARTERIAL
ACID-BASE EXCESS: 2.3 mmol/L — AB (ref 0.0–2.0)
BICARBONATE: 26.3 mmol/L (ref 20.0–28.0)
DRAWN BY: 295031
FIO2: 30
LHR: 14 {breaths}/min
MECHVT: 470 mL
O2 SAT: 97.5 %
PATIENT TEMPERATURE: 98.6
PCO2 ART: 40.4 mmHg (ref 32.0–48.0)
PEEP/CPAP: 5 cmH2O
PH ART: 7.429 (ref 7.350–7.450)
PO2 ART: 108 mmHg (ref 83.0–108.0)

## 2016-10-16 LAB — CBC WITH DIFFERENTIAL/PLATELET
Basophils Absolute: 0 10*3/uL (ref 0.0–0.1)
Basophils Relative: 0 %
EOS ABS: 0.1 10*3/uL (ref 0.0–0.7)
EOS PCT: 2 %
HCT: 26.6 % — ABNORMAL LOW (ref 36.0–46.0)
HEMOGLOBIN: 8.7 g/dL — AB (ref 12.0–15.0)
LYMPHS ABS: 1.2 10*3/uL (ref 0.7–4.0)
LYMPHS PCT: 20 %
MCH: 28.6 pg (ref 26.0–34.0)
MCHC: 32.7 g/dL (ref 30.0–36.0)
MCV: 87.5 fL (ref 78.0–100.0)
MONOS PCT: 11 %
Monocytes Absolute: 0.6 10*3/uL (ref 0.1–1.0)
Neutro Abs: 4.1 10*3/uL (ref 1.7–7.7)
Neutrophils Relative %: 67 %
PLATELETS: 127 10*3/uL — AB (ref 150–400)
RBC: 3.04 MIL/uL — AB (ref 3.87–5.11)
RDW: 16.5 % — ABNORMAL HIGH (ref 11.5–15.5)
WBC: 6 10*3/uL (ref 4.0–10.5)

## 2016-10-16 LAB — GLUCOSE, CAPILLARY
GLUCOSE-CAPILLARY: 168 mg/dL — AB (ref 65–99)
GLUCOSE-CAPILLARY: 183 mg/dL — AB (ref 65–99)
Glucose-Capillary: 104 mg/dL — ABNORMAL HIGH (ref 65–99)
Glucose-Capillary: 124 mg/dL — ABNORMAL HIGH (ref 65–99)
Glucose-Capillary: 159 mg/dL — ABNORMAL HIGH (ref 65–99)
Glucose-Capillary: 168 mg/dL — ABNORMAL HIGH (ref 65–99)
Glucose-Capillary: 94 mg/dL (ref 65–99)

## 2016-10-16 LAB — CULTURE, RESPIRATORY
CULTURE: NORMAL
SPECIAL REQUESTS: NORMAL

## 2016-10-16 LAB — CULTURE, RESPIRATORY W GRAM STAIN

## 2016-10-16 LAB — MAGNESIUM: MAGNESIUM: 2.4 mg/dL (ref 1.7–2.4)

## 2016-10-16 MED ORDER — FREE WATER
200.0000 mL | Status: DC
  Administered 2016-10-16 – 2016-10-17 (×2): 200 mL

## 2016-10-16 MED ORDER — VITAL AF 1.2 CAL PO LIQD
1000.0000 mL | ORAL | Status: DC
  Filled 2016-10-16: qty 1000

## 2016-10-16 MED ORDER — PRO-STAT SUGAR FREE PO LIQD: 30.0000 mL | Freq: Two times a day (BID) | ORAL | Status: DC

## 2016-10-16 MED ORDER — SODIUM CHLORIDE 3 % IN NEBU
4.0000 mL | INHALATION_SOLUTION | Freq: Two times a day (BID) | RESPIRATORY_TRACT | Status: AC
  Administered 2016-10-16 – 2016-10-17 (×3): 4 mL via RESPIRATORY_TRACT
  Filled 2016-10-16 (×6): qty 4

## 2016-10-16 NOTE — Anesthesia Preprocedure Evaluation (Addendum)
Anesthesia Evaluation  Patient identified by MRN, date of birth, ID band Patient awake    Reviewed: Allergy & Precautions, H&P , Patient's Chart, lab work & pertinent test results, reviewed documented beta blocker date and time   History of Anesthesia Complications (+) history of anesthetic complications  Airway Mallampati: Intubated   Neck ROM: full    Dental no notable dental hx.    Pulmonary former smoker,    + rhonchi        Cardiovascular hypertension,  Rhythm:regular Rate:Normal     Neuro/Psych    GI/Hepatic   Endo/Other  diabetes  Renal/GU      Musculoskeletal   Abdominal   Peds  Hematology   Anesthesia Other Findings Anemia    Hypertension   Anemia.......  8.7Hb Neuropathy    Diabetes mellitus type 2 Renal Insuffiency, see [facility] yearly ( Cr 2.2) GERD (gastroesophageal reflux disease)   Arthritis    CHF ;had echo w/ appearance consistent with hypertensive changes or infiltrative cardiomyopathy, such as amyloidosis. Systolic function was moderately to severely reduced. The estimated ejection fraction was in the range of 30% to 35%.     Reproductive/Obstetrics                            Anesthesia Physical Anesthesia Plan  ASA: III  Anesthesia Plan: General   Post-op Pain Management:    Induction: Inhalational  Airway Management Planned: Oral ETT  Additional Equipment:   Intra-op Plan:   Post-operative Plan: Post-operative intubation/ventilation  Informed Consent: I have reviewed the patients History and Physical, chart, labs and discussed the procedure including the risks, benefits and alternatives for the proposed anesthesia with the patient or authorized representative who has indicated his/her understanding and acceptance.   Dental Advisory Given and Dental advisory given  Plan Discussed with: CRNA and Surgeon  Anesthesia Plan Comments:  (Intubated; will discuss surgical plan in am with team and formulate anesthetic based on surgical needs  )        Anesthesia Quick Evaluation

## 2016-10-16 NOTE — Progress Notes (Signed)
Nutrition Follow-up  DOCUMENTATION CODES:   Non-severe (moderate) malnutrition in context of acute illness/injury  INTERVENTION:  - Will change TF regimen: Vital 1.2 @ 45 mL/hr with 30 mL Prostat BID. This regimen will provide 1496 kcal, 111 grams of protein, and 876 mL free water.  - PEPuP protocol. - Free water flush per MD/NP, if desired, given hx of CHF and with current hypernatremia and hyperchloridemia.  - RD will follow-up 11/28.  NUTRITION DIAGNOSIS:   Inadequate oral intake related to poor appetite, inability to eat as evidenced by per patient/family report, NPO status. -ongoing  GOAL:   Patient will meet greater than or equal to 90% of their needs -met with current TF regimen.   MONITOR:   TF tolerance, Vent status, Weight trends, Labs, Skin, I & O's  ASSESSMENT:   75 year old female with past medical history as below, which is significant for diabetes, CHF, chronic kidney disease, and hypertension. She presented to [facility] emergency department 10/12/2016 with complaints of shortness of breath and generalized weakness for about 2 weeks. She has additionally suffered a 30 pound weight loss over some recent period of time which is not entirely clear. Recently treated with antibiotics as an outpatient for suspected bronchitis vs pneumonia.Upon arrival to the emergency department she was on 4 L of supplemental O2 via nasal cannula and still had increased work of breathing.due to her somnolence ABG was ordered which showed  Hypercapnic respiratory failure and she was started on BiPAP. She was known to be a DO NOT RESUSCITATE, however,  Family has decided to proceed with intubation, which was done in the emergency department. During intubation a supraglottic mass was noted by emergency room provider which made intubation difficult.  11/27 Consult for TF received on 11/25 and TF ordered per recommendation on 11/24 (Vital 1.2 @ 25 mL/hr and increase by 10 mL every 8 hours  to reach goal of Vital 1.2 @ 55 mL/hr). Estimated nutrition needs updated this AM and based on weight from 11/24 (81.4 kg) as weight +6.3 kg since that time; will continue to monitor weight trends and adjust as needed. Pt with OGT and currently receiving Vital 1.2 @ 55 mL/hr which is providing 1584 kcal (111% re-estimated kcal need), 99 grams of protein, and 1071 mL free water. Will adjust TF regimen as outlined above to more appropriately meet estimated needs. Current order for 200 mL free water TID (600 mL free water/day; not programmed into pump at time of RD visit).   Per MD note yesterday at 1459: medically cleared for endoscopy and biopsy.   Patient is currently intubated on ventilator support MV: 7 L/min Temp (24hrs), Avg:98 F (36.7 C), Min:97.5 F (36.4 C), Max:98.2 F (36.8 C) Propofol: none BP: 120/65 and MAP: 82.   Medications reviewed; 325 mg ferrous sulfate via OGT/day, sliding scale Novolog, PRN IV ZOfran, 40 mg Protonix per OGT/day.  Labs reviewed; CBGs: 159-183 mg/dL today, Na: 149 mmol/L, Cl: 117 mmol/L, BUN: 60 mg/dL, creatinine: 2.12 mg/dL, GFR: 25 mL/min, Phos: 2.4 mg/dL, Mg WDL.     11/24 - Pt is intubated with OGT in place with ~300cc dark drainage present at time of RD visit.  - Pt's daughter-in-law is at bedside and provides all information. - She states that she lives out of town and the information that she provided to RD was provided to her over the phone by the pt PTA.  - For ~2 weeks pt had poor appetite/was eating less and also stated that it  was difficult to swallow solid foods and liquids (noted in H&P note that intubation was difficult d/t supraglottic mass).  - Daughter-in-law states that she can tell that the pt lost weight by visualizing her but she is unsure of pt's UBW or how much weight pt may have lost.  - Physical assessment shows mild fat wasting around orbital area but no other muscle or fat wasting noted; mild edema to BLE.  - Per chart review,  pt has lost 7 lbs (4% body weight) in the past 18 days which is significant for time frame.  - Pt meets criteria based on weight loss and estimated <75% intakes for >/=7 days PTA.   Patient is currently intubated on ventilator support MV: 6.7 L/min Temp (24hrs), Avg:95.7 F (35.4 C), Min:90.9 F (32.7 C), Max:99.9 F (37.7 C) Propofol: none Drip: Heparin @ 1000 units/hr.    Diet Order:  Diet NPO time specified  Skin:  Wound (see comment) (Stage 2 L buttocks pressure injury)  Last BM:  11/25  Height:   Ht Readings from Last 1 Encounters:  10/13/16 '5\' 6"'  (1.676 m)    Weight:   Wt Readings from Last 1 Encounters:  10/16/16 193 lb 5.5 oz (87.7 kg)    Ideal Body Weight:  59.09 kg  BMI:  Body mass index is 31.21 kg/m.  Estimated Nutritional Needs:   Kcal:  1428  Protein:  98-122 grams (1.2-1.5 grams/kg)  Fluid:  1.5-1.7 L/day  EDUCATION NEEDS:   No education needs identified at this time    Jarome Matin, MS, RD, LDN, CNSC Inpatient Clinical Dietitian Pager # [phone] After hours/weekend pager # [phone]

## 2016-10-16 NOTE — Progress Notes (Signed)
PULMONARY / CRITICAL CARE MEDICINE   Name: Mandy Rhodes MRN: [id number] DOB: [birth year]    ADMISSION DATE:  10/12/2016 CONSULTATION DATE:  10/13/2016  REFERRING MD:  Dr. Roxanne Mins EDP  CHIEF COMPLAINT:  Shortness of breath  HISTORY OF PRESENT ILLNESS:  75 y.o. female with past medical history as below, which is significant for diabetes, CHF, chronic kidney disease, and hypertension. She presented to [facility] emergency department 10/12/2016 with complaints of shortness of breath and generalized weakness for about 2 weeks. She has additionally suffered a 30 pound weight loss over some recent period of time which is not entirely clear. Recently treated with antibiotics as an outpatient for suspected bronchitis vs pneumonia.Upon arrival to the emergency department she was on 4 L of supplemental O2 via nasal cannula and still had increased work of breathing.due to her somnolence ABG was ordered which showed  Hypercapnic respiratory failure and she was started on BiPAP. She was known to be a DO NOT RESUSCITATE, however,  Family has decided to proceed with intubation, which was done in the emergency department. During intubation a supraglottic mass was noted by emergency room provider which made intubation difficult. Laboratory evaluation significant forserum creatinine 1.8, calcium 10.5, troponin 0.53, WBC 12, glucose 137.PCCM asked to admit  SUBJECTIVE: Hypotension overnight responded to IV fluids. Patient evaluated by ENT and cardiology. More awake and alert this morning. Respiratory therapist reports patient has tolerated pressure support wean to 5/5. Patient nods no to any pain or difficulty breathing. Does admit that she is hungry.  REVIEW OF SYSTEMS:  Unable to obtain due to intubation.  VITAL SIGNS: BP (!) 118/59   Pulse 78   Temp 97.5 F (36.4 C)   Resp 14   Ht 5\' 6"  (1.676 m)   Wt 193 lb 5.5 oz (87.7 kg)   SpO2 100%   BMI 31.21 kg/m   HEMODYNAMICS:    VENTILATOR  SETTINGS: Vent Mode: PRVC FiO2 (%):  [30 %] 30 % Set Rate:  [14 bmp] 14 bmp Vt Set:  [470 mL] 470 mL PEEP:  [5 cmH20] 5 cmH20 Pressure Support:  [10 cmH20] 10 cmH20 Plateau Pressure:  [15 cmH20-21 cmH20] 20 cmH20  INTAKE / OUTPUT: I/O last 3 completed shifts: In: 4148.2 [I.V.:1268.3; Other:120; NG/GT:2559.8; IV Piggyback:200] Out: 1250 [Urine:1250]  PHYSICAL EXAMINATION: General:  Eyes open. Multiple family members at bedside. No distress. Neuro:  Following commands. Grossly nonfocal. Moving all extremities equally. HEENT:  Endotracheal tube in place. No scleral icterus or injection. Cardiovascular:  Regular rate and rhythm. No edema. No appreciable JVD. Pulmonary: Clear with auscultation bilaterally. Symmetric chest wall rise on ventilator. Abdomen:  Soft. Protuberant. Normal bowel sounds. Musculoskeletal:  No joint effusion or deformity appreciated. Skin:  Warm and dry. no rash on exposed skin.  LABS:  BMET  Recent Labs Lab 10/14/16 0404 10/15/16 0645 10/16/16 0332  NA 150* 148* 149*  K 3.7 3.4* 3.6  CL 117* 117* 117*  CO2 24 24 26   BUN 58* 61* 60*  CREATININE 2.18* 2.22* 2.12*  GLUCOSE 90 203* 198*    Electrolytes  Recent Labs Lab 10/14/16 0404 10/15/16 0645 10/16/16 0332  CALCIUM 9.0 9.2 9.2  MG 1.8 2.4 2.4  PHOS 2.8 3.2 2.4*    CBC  Recent Labs Lab 10/14/16 0404 10/15/16 0645 10/16/16 0332  WBC 6.2 6.4 6.0  HGB 9.1* 8.8* 8.7*  HCT 27.2* 26.5* 26.6*  PLT 156 127* 127*    Coag's  Recent Labs Lab 10/13/16 0058  APTT  32  INR 1.22    Sepsis Markers  Recent Labs Lab 10/13/16 0310 10/13/16 0534 10/13/16 2109 10/14/16 0404 10/15/16 0645  LATICACIDVEN 2.4* 1.8 1.4  --   --   PROCALCITON <0.10  --   --  <0.10 <0.10    ABG  Recent Labs Lab 10/12/16 2324 10/13/16 0202 10/13/16 0443  PHART 7.225* 7.539* 7.509*  PCO2ART 78.0* 29.8* 33.5  PO2ART 230* 51.1* 53.4*    Liver Enzymes  Recent Labs Lab 10/12/16 2302  10/14/16 0404 10/15/16 0645 10/16/16 0332  AST 33  --   --   --   ALT 30  --   --   --   ALKPHOS 97  --   --   --   BILITOT 1.0  --   --   --   ALBUMIN 3.3* 2.5* 2.5* 2.3*    Cardiac Enzymes  Recent Labs Lab 10/13/16 0310 10/13/16 1033 10/13/16 1716  TROPONINI 0.64* 0.87* 1.09*    Glucose  Recent Labs Lab 10/15/16 1220 10/15/16 1628 10/15/16 1934 10/16/16 0014 10/16/16 0335 10/16/16 0738  GLUCAP 179* 177* 157* 183* 168* 159*    Imaging No results found.  STUDIES:  CT CHEST/NECK/SOFT TISSUE W/O 11/24: IMPRESSION: 1. Fluid and debris throughout the oropharynx, hypopharynx, and supraglottic airway from intubation. Suboptimal evaluation of the mucosa due to airway opacification and lack of contrast. Follow-up contrast examination after extubation and/or direct visualization recommended for evaluation of the supraglottic mass. 2. No cervical lymphadenopathy identified. 3. Left lower lobe consolidation and nodular opacities throughout the lungs compatible with pneumonia. Small left pleural effusion. 4. Endotracheal tube 1 cm from carina, retraction recommended. 5. Enteric tube tip below the field of view in the stomach. 6. Moderate coronary artery calcifications. 7. Borderline main pulmonary artery may represent pulmonary artery hypertension. CT HEAD W/O 11/24:  No acute intracranial abnormality. Moderate brain parenchymal atrophy and chronic microvascular disease. TTE 11/24: LV normal in size with EF 30-35%. Moderate concentric LVH. Appearance consistent with infiltrative cardiomyopathy. Moderate diffuse hypokinesis without regional variation. Grade 2 diastolic dysfunction. LA moderately dilated & RA normal in size. RV normal in size and function. No aortic stenosis or regurgitation. Aortic root normal in size. Trivial mitral regurgitation without stenosis. No pulmonic regurgitation. Mild tricuspid regurgitation. No pericardial effusion.  MICROBIOLOGY: MRSA PCR 11/24:   Negative Blood Ctx x2 11/24 >> Urine Legionella Ag 11/24 >> Urine Streptococcal Ag 11/24:  Negative Respiratory Viral Panel PCR 11/24 (Tracheal):  Negative Tracheal Aspirate Ctx 11/24 >>  ANTIBIOTICS: Unasyn 11/24 >>   SIGNIFICANT EVENTS: 11/24 - intubated, admitted to ICU.  LINES/TUBES: OETT 7.5 11/23 >> OGT 11/23 >> FOLEY 11/23 >> PIV  ASSESSMENT / PLAN:  PULMONARY A: Acute Hypercarbic Respiratory Failure Severe Community Acquired Pneumonia Left Lower Lobe vs aspiration  Supraglottic Mass - Seen on intubation. Not appreciated on Neck CT w/o.  H/O Tobacco Use - Cigarettes & chewing tobacco  P:   Full ventilator support Intermittent ABG & Port CXR (today as last obtained on 24th) Vap Bundle Xopenex & Atrovent Nebs q6hr Scheduled ENT planning on biopsy & possible tracheostomy this week  CARDIOVASCULAR A:  NSTEMI/Elevated Troponin I - S/P 300mg  Rectal ASA. SVT - Seen since admission. Borderline Hypotension - Likely due to hypovolemia.  Probable Infiltrative Cardiomyopathy - Seen on TTE. Question amyloidosis.  H/O CHF H/O HTN H/o Hyperlipidemia  P:  Continuous Telemetry Monitoring Vitals per Unit Protocol Cardiology Consulted & appreciate recommendations ASA 81mg  VT daily Continue home captopril, pravastatin Holding home  Lasix  RENAL A:   Acute Renal Failure - now leveling off Lactic Acidosis - Resolved. Mild. Hypokalemia - Resolved. Hypernatremia - Stable. Hyperchloremia - Stable. H/O CKD Stage   P:   Trending UOP with Foley Monitoring renal function & electrolytes daily Replacing electrolytes as indicated  Free Water 200cc VT q4  GASTROINTESTINAL A:   Moderate Protein-Calorie Malnutrition H/O GERD  P:   NPO Switch to Protonix VT daily D/C Pepcid Cont tube feedings  HEMATOLOGIC/ONCOLOGIC A:   Anemia - No signs of active bleeding. Chronic w/ Hgb 10.4-11.1. Supraglottic Mass - Not appreciated w/ noncontrast neck CT.  P:  Trending Cell  Counts daily w/ CBC Heparin gtt per Pharmacy Protocol  ENT Consulted & appreciate assistance FeSO4 daily  INFECTIOUS A:   Possible Sepsis Possible Severe Community Acquired Pneumonia Left Lower Lobe (NPF)  P:   Empiric Unasyn Day #3 Trending Procalcitonin per Algorithm   ENDOCRINE A:   H/O Diabetes Mellitus Type 2 - A1c 7.2.  P:   SSI per Sensitive Algorithm Accu-Checks q4hr Holding home glimepiride  NEUROLOGIC A:   Acute Encephalopathy - Likely secondary to hypercarbia vs toxic metabolic. Sedation on Ventilator H/O Neuropathy - Secondary to diabetes.  P:   RASS goal: 0 to -1 Versed IV prn Sedation Fentanyl IV prn Pain/Discomfort. Holding home Nortriptyline   FAMILY  - Updates: Family updated by Dr. Ashok Cordia 11/25 at bedside.  - Inter-disciplinary family meet or Palliative Care meeting due by:  11/30  TODAY'S SUMMARY:  75 y.o. female known to have chronic kidney disease not on dialysis, known to have chronic systolic and diastolic congestive heart failure, presenting with 2 week history of shortness of breath, worsening dysphagia. Patient's mental status has remarkably improved. Respiratory status is improving as well. Plan for trach later this week at which time we will get bx of mass. Suspect can come off vent quickly    Critical care time 30 minutes   Erick Colace ACNP-BC [place] Pager # [phone] OR # [phone] if no answer

## 2016-10-16 NOTE — Care Management Note (Signed)
Case Management Note  Patient Details  Name: Mandy Rhodes MRN: [id number] Date of Birth: 1941/06/13  Subjective/Objective:        Neck mass requiring ventilation            Action/Plan: TBD Date:  October 16, 2016 Chart reviewed for concurrent status and case management needs. Will continue to follow patient progress. Discharge Planning: following for needs Expected discharge date: [id number] Velva Harman, BSN, [place], [place]   Expected Discharge Date:   (unknown)               Expected Discharge Plan:  Home/Self Care  In-House Referral:  Clinical Social Work  Discharge planning Services  CM Consult  Post Acute Care Choice:    Choice offered to:     DME Arranged:    DME Agency:     HH Arranged:    [place] Agency:     Status of Service:  In process, will continue to follow  If discussed at Long Length of Stay Meetings, dates discussed:    Additional Comments:  Leeroy Cha, RN 10/16/2016, 10:27 AM

## 2016-10-16 NOTE — Progress Notes (Signed)
Patient Name: Mandy Rhodes Date of Encounter: 10/16/2016  Primary Cardiologist: [facility] Problem List     Principal Problem:   Sepsis [facility]) Active Problems:   Essential hypertension   Type 2 diabetes mellitus with stage 3 chronic kidney disease, without long-term current use of insulin (HCC)   Chronic combined systolic and diastolic heart failure (HCC)   Non-ischemic cardiomyopathy (HCC)   Acute respiratory failure (HCC)   Acute kidney injury superimposed on CKD (HCC)   Pressure injury of skin   PAT (paroxysmal atrial tachycardia) (HCC)   Malnutrition of moderate degree   Laryngeal mass   Hyperosmolality and/or hypernatremia   Anemia   Hypokalemia   Demand ischemia ([place])   CAP (community acquired pneumonia)    Subjective   Arousable on the vent, denies any pain, does nod her head that she can feel fluttering at times.  Inpatient Medications    . ampicillin-sulbactam (UNASYN) IV  3 g Intravenous Q12H  . aspirin  81 mg Per Tube Daily  . chlorhexidine gluconate (MEDLINE KIT)  15 mL Mouth Rinse BID  . ferrous sulfate  325 mg Oral Daily  . free water  200 mL Per Tube Q8H  . heparin subcutaneous  5,000 Units Subcutaneous Q8H  . insulin aspart  0-9 Units Subcutaneous Q4H  . ipratropium  0.5 mg Nebulization Q6H  . levalbuterol  0.63 mg Nebulization Q6H  . mouth rinse  15 mL Mouth Rinse QID  . pantoprazole sodium  40 mg Per Tube Daily  . pravastatin  40 mg Oral Daily    Vital Signs    Vitals:   10/16/16 0500 10/16/16 0600 10/16/16 0700 10/16/16 0800  BP:      Pulse:  77 93 79  Resp: 20 20 (!) 26 20  Temp: 97.5 F (36.4 C) 97.7 F (36.5 C) 97.7 F (36.5 C) 97.9 F (36.6 C)  TempSrc:      SpO2: 100% 100% 98% 100%  Weight:      Height:        Intake/Output Summary (Last 24 hours) at 10/16/16 0826 Last data filed at 10/16/16 0800  Gross per 24 hour  Intake          2983.17 ml  Output              875 ml  Net          2108.17 ml   Filed Weights    10/14/16 0500 10/15/16 0600 10/16/16 0418  Weight: 183 lb 6.8 oz (83.2 kg) 188 lb 11.4 oz (85.6 kg) 193 lb 5.5 oz (87.7 kg)    Physical Exam    General: Well developed AAF in no acute distress. HEENT: Normocephalic, atraumatic, sclera non-icteric, no xanthomas, nares are without discharge. Neck: Supple, JVP not elevated. Lungs: Clear bilaterally to auscultation anteriorly on vent without wheezes, rales, or rhonchi. Cardiac: RRR S1 S2 without murmurs, rubs, or gallops.  Abdomen: Soft, non-tender, non-distended with normoactive bowel sounds. No rebound/guarding. Extremities: No clubbing or cyanosis. No edema. Distal pedal pulses are 2+ and equal bilaterally. Skin: Warm and dry, no significant rash. Neuro: Follows commands. Awakens easily on the vent, alert and able to communicate through nodding.  Labs    CBC  Recent Labs  10/15/16 0645 10/16/16 0332  WBC 6.4 6.0  NEUTROABS 4.5 4.1  HGB 8.8* 8.7*  HCT 26.5* 26.6*  MCV 85.8 87.5  PLT 127* 235*   Basic Metabolic Panel  Recent Labs  10/14/16 0404 10/15/16 0645  10/16/16 0332  NA 150* 148*  --   K 3.7 3.4*  --   CL 117* 117*  --   CO2 24 24  --   GLUCOSE 90 203*  --   BUN 58* 61*  --   CREATININE 2.18* 2.22*  --   CALCIUM 9.0 9.2  --   MG 1.8 2.4 2.4  PHOS 2.8 3.2  --    Liver Function Tests  Recent Labs  10/14/16 0404 10/15/16 0645  ALBUMIN 2.5* 2.5*   No results for input(s): LIPASE, AMYLASE in the last 72 hours. Cardiac Enzymes  Recent Labs  10/13/16 1033 10/13/16 1716  TROPONINI 0.87* 1.09*    Telemetry    NSR occasional PVCs, brief runs of what look like PAT  Radiology    Dg Abd 1 View  Result Date: 10/13/2016 CLINICAL DATA:  Enteric tube placement. EXAM: ABDOMEN - 1 VIEW COMPARISON:  None. FINDINGS: Normal bowel gas pattern. Enteric tube tip projects over the mid stomach. Multilevel degenerative changes of the lumbar spine. Moderate bilateral hip osteoarthrosis. IMPRESSION: Enteric tube  tip projects over mid stomach. Electronically Signed   By: Kristine Garbe M.D.   On: 10/13/2016 05:30   Ct Head Wo Contrast  Result Date: 10/13/2016 CLINICAL DATA:  Unresponsive patient. Patient complained of breath and generalized weakness for 2 weeks. EXAM: CT HEAD WITHOUT CONTRAST TECHNIQUE: Contiguous axial images were obtained from the base of the skull through the vertex without intravenous contrast. COMPARISON:  None. FINDINGS: Brain: No evidence of acute infarction, hemorrhage, hydrocephalus, extra-axial collection or mass lesion/mass effect. There is moderate brain parenchymal volume loss and symmetric bilateral microangiopathy. Vascular: Atherosclerotic disease at the skullbase. Skull: Normal. Negative for fracture or focal lesion. Sinuses/Orbits: No acute finding. Other: None. IMPRESSION: No acute intracranial abnormality. Moderate brain parenchymal atrophy and chronic microvascular disease. Electronically Signed   By: Fidela Salisbury M.D.   On: 10/13/2016 12:35   Ct Soft Tissue Neck Wo Contrast  Addendum Date: 10/13/2016   ADDENDUM REPORT: 10/13/2016 04:53 ADDENDUM: These results were called by telephone at the time of interpretation on 10/13/2016 at 4:53 am to Dr. Jimmy Footman, who verbally acknowledged these results. Electronically Signed   By: Kristine Garbe M.D.   On: 10/13/2016 04:53   Result Date: 10/13/2016 CLINICAL DATA:  75 y/o F; shortness of breath and generalized weakness for 2 weeks. Evaluation of supraglottic mass seen on intubation. EXAM: CT NECK AND CHEST WITHOUT CONTRAST COMPARISON:  07/15/2015 CT of cervical spine. FINDINGS: CT NECK FINDINGS Pharynx and larynx: There is fluid and debris throughout the oropharynx, hypopharynx, and supraglottic airway due to intubation and nasoenteric tube. Addition, the region is obscured by artifact from cervical fusion hardware and the endotracheal tube. No soft tissue mass can be distinguished. Laryngeal cartilage  appears intact. Subglottic airway is patent. Salivary glands: No inflammation, mass, or stone. Thyroid: Normal. Lymph nodes: None enlarged or abnormal density. Vascular: Calcific atherosclerosis of bilateral carotid bifurcations. Limited intracranial: Negative. Visualized orbits: Not within the field of view Mastoids and visualized paranasal sinuses: Normally pneumatized mastoid air cells and visualized sinuses. Skeleton: Anterior cervical fusion with plate and screws from C3 through C6. Multilevel uncovertebral and facet hypertrophy with severe bony foraminal narrowing through the levels of fusion 1. C6-7 disc osteophyte complex with moderate to severe canal stenosis. C2-3 disc osteophyte complex with at least moderate canal stenosis. No acute osseous abnormality is identified. Flowing anterior ossification of upper thoracic spine may represent DISH. CT CHEST FINDINGS Cardiovascular: Mild cardiomegaly.  No pericardial effusion. Moderate coronary artery calcification. Normal caliber thoracic aorta. Borderline enlarged main pulmonary artery. Mediastinum/Nodes: No enlarged mediastinal or axillary lymph nodes. Thyroid gland, trachea, and esophagus demonstrate no significant findings. Endotracheal tube is less than 1 cm from the carina. Lungs/Pleura: There is patchy nodular consolidation within the bilateral upper and right lower lobe and a dense consolidation within the left lower lobe with air bronchograms. Small left pleural effusion. Upper Abdomen: Enteric tube tip below the field of view in the stomach. No acute process. Musculoskeletal: Flowing anterior ossification of vertebral bodies consistent with DISH. No acute osseous abnormality. IMPRESSION: 1. Fluid and debris throughout the oropharynx, hypopharynx, and supraglottic airway from intubation. Suboptimal evaluation of the mucosa due to airway opacification and lack of contrast. Follow-up contrast examination after extubation and/or direct visualization  recommended for evaluation of the supraglottic mass. 2. No cervical lymphadenopathy identified. 3. Left lower lobe consolidation and nodular opacities throughout the lungs compatible with pneumonia. Small left pleural effusion. 4. Endotracheal tube 1 cm from carina, retraction recommended. 5. Enteric tube tip below the field of view in the stomach. 6. Moderate coronary artery calcifications. 7. Borderline main pulmonary artery may represent pulmonary artery hypertension. Electronically Signed: By: Kristine Garbe M.D. On: 10/13/2016 04:36   Ct Chest Wo Contrast  Addendum Date: 10/13/2016   ADDENDUM REPORT: 10/13/2016 04:53 ADDENDUM: These results were called by telephone at the time of interpretation on 10/13/2016 at 4:53 am to Dr. Jimmy Footman, who verbally acknowledged these results. Electronically Signed   By: Kristine Garbe M.D.   On: 10/13/2016 04:53   Result Date: 10/13/2016 CLINICAL DATA:  75 y/o F; shortness of breath and generalized weakness for 2 weeks. Evaluation of supraglottic mass seen on intubation. EXAM: CT NECK AND CHEST WITHOUT CONTRAST COMPARISON:  07/15/2015 CT of cervical spine. FINDINGS: CT NECK FINDINGS Pharynx and larynx: There is fluid and debris throughout the oropharynx, hypopharynx, and supraglottic airway due to intubation and nasoenteric tube. Addition, the region is obscured by artifact from cervical fusion hardware and the endotracheal tube. No soft tissue mass can be distinguished. Laryngeal cartilage appears intact. Subglottic airway is patent. Salivary glands: No inflammation, mass, or stone. Thyroid: Normal. Lymph nodes: None enlarged or abnormal density. Vascular: Calcific atherosclerosis of bilateral carotid bifurcations. Limited intracranial: Negative. Visualized orbits: Not within the field of view Mastoids and visualized paranasal sinuses: Normally pneumatized mastoid air cells and visualized sinuses. Skeleton: Anterior cervical fusion with plate and  screws from C3 through C6. Multilevel uncovertebral and facet hypertrophy with severe bony foraminal narrowing through the levels of fusion 1. C6-7 disc osteophyte complex with moderate to severe canal stenosis. C2-3 disc osteophyte complex with at least moderate canal stenosis. No acute osseous abnormality is identified. Flowing anterior ossification of upper thoracic spine may represent DISH. CT CHEST FINDINGS Cardiovascular: Mild cardiomegaly. No pericardial effusion. Moderate coronary artery calcification. Normal caliber thoracic aorta. Borderline enlarged main pulmonary artery. Mediastinum/Nodes: No enlarged mediastinal or axillary lymph nodes. Thyroid gland, trachea, and esophagus demonstrate no significant findings. Endotracheal tube is less than 1 cm from the carina. Lungs/Pleura: There is patchy nodular consolidation within the bilateral upper and right lower lobe and a dense consolidation within the left lower lobe with air bronchograms. Small left pleural effusion. Upper Abdomen: Enteric tube tip below the field of view in the stomach. No acute process. Musculoskeletal: Flowing anterior ossification of vertebral bodies consistent with DISH. No acute osseous abnormality. IMPRESSION: 1. Fluid and debris throughout the oropharynx, hypopharynx, and supraglottic airway from  intubation. Suboptimal evaluation of the mucosa due to airway opacification and lack of contrast. Follow-up contrast examination after extubation and/or direct visualization recommended for evaluation of the supraglottic mass. 2. No cervical lymphadenopathy identified. 3. Left lower lobe consolidation and nodular opacities throughout the lungs compatible with pneumonia. Small left pleural effusion. 4. Endotracheal tube 1 cm from carina, retraction recommended. 5. Enteric tube tip below the field of view in the stomach. 6. Moderate coronary artery calcifications. 7. Borderline main pulmonary artery may represent pulmonary artery  hypertension. Electronically Signed: By: Kristine Garbe M.D. On: 10/13/2016 04:36   US Biopsy  Result Date: 09/25/2016 INDICATION: 76 year old female with a question of amyloidosis EXAM: ULTRASOUND BIOPSY CORE LIVER MEDICATIONS: None. ANESTHESIA/SEDATION: Moderate (conscious) sedation was employed during this procedure. A total of Versed 1.0 mg and Fentanyl 50 mcg was administered intravenously. Moderate Sedation Time: 11 minutes. The patient's level of consciousness and vital signs were monitored continuously by radiology nursing throughout the procedure under my direct supervision. FLUOROSCOPY TIME:  None COMPLICATIONS: None PROCEDURE: Informed written consent was obtained from the patient and the patient's family after a thorough discussion of the procedural risks, benefits and alternatives. All questions were addressed. Maximal Sterile Barrier Technique was utilized including caps, mask, sterile gowns, sterile gloves, sterile drape, hand hygiene and skin antiseptic. A timeout was performed prior to the initiation of the procedure. Ultrasound survey of the right abdomen was performed with images stored and sent to PACs. The patient is then prepped and draped in the usual sterile fashion. The skin and subcutaneous tissues were generously infiltrated 1% lidocaine for local anesthesia. Small stab incision was made with 11 blade scalpel. Using ultrasound guidance, an osteo site 11 gauge needle was advanced into the subcutaneous fat, observed under ultrasound to acquire multiple small adipose samples. Samples were placed into saline for transportation to the lab. Final ultrasound image was stored.  Sterile bandage was placed. Patient tolerated the procedure well and remained hemodynamically stable throughout. No complications were encountered and no significant blood loss encountered. IMPRESSION: Status post ultrasound-guided fat pad biopsy for evaluation for possible amyloidosis. Signed, Dulcy Fanny.  Earleen Newport, DO [facility] Radiology Electronically Signed   By: Corrie Mckusick D.O.   On: 09/25/2016 15:32   Dg Chest Port 1 View  Result Date: 10/13/2016 CLINICAL DATA:  75 y/o  F; endotracheal tube placement. EXAM: PORTABLE CHEST 1 VIEW COMPARISON:  10/13/2016 chest CT. FINDINGS: Endotracheal tube is 2 cm from the carina. Enteric tube tip below the field of view in the abdomen. Left lower lobe consolidation. No pneumothorax. Stable cardiac silhouette. No acute osseous abnormality is evident. IMPRESSION: Endotracheal tube approximately 2 cm from carina. Left lower lobe consolidation. Electronically Signed   By: Kristine Garbe M.D.   On: 10/13/2016 05:29   Dg Chest Portable 1 View  Result Date: 10/13/2016 CLINICAL DATA:  Post intubation. EXAM: PORTABLE CHEST 1 VIEW COMPARISON:  10/12/2016 FINDINGS: Endotracheal tube is been placed with tip measuring 3.9 cm above the carina. An enteric tube was placed. Tip is off the field of view but below the left hemidiaphragm. Shallow inspiration. Borderline heart size without vascular congestion or edema. No focal consolidation in the lungs. Degenerative changes in the shoulders. Tortuous aorta. IMPRESSION: Appliances appear in satisfactory position. Mild cardiac enlargement. No focal airspace disease or consolidation in the lungs. Electronically Signed   By: Lucienne Capers M.D.   On: 10/13/2016 01:23   Dg Chest Port 1 View  Result Date: 10/12/2016  CLINICAL DATA:  Weakness for 2 weeks. Shortness of breath and wheezing tonight. EXAM: PORTABLE CHEST 1 VIEW COMPARISON:  07/11/2016 FINDINGS: Cardiac enlargement without vascular congestion or edema. No focal consolidation in the lungs. No blunting of costophrenic angles. No pneumothorax. Degenerative changes in the spine and shoulders. IMPRESSION: Cardiac enlargement.  No evidence of active pulmonary disease. Electronically Signed   By: Lucienne Capers M.D.    On: 10/12/2016 23:55     Patient Profile     75 year old with chronic combined CHF due to infiltrative cardiomyopathy, presumed cardiac amyloidosis by MRI with prior nondiagnostic fat pad biopsy, minimal CAD by cath 03/2016, DM, HTN, CKD stage III, admitted with sepsis/acute respiratory failure due to LLL pneumonia requiring ventilation, complicated by presence of laryngeal mass, recurrent PAT, elevated troponin, acute encephalopathy, AKI on CKD, and acute on chronic anemia. 2D echo 10/13/16: mod LVH, EF 30-35%, grade 2 DD, mod LAE, PASP 39.  Assessment & Plan    1. Sepsis/LLL PNA - per IM.  2. NICM/chronic combined CHF due to suspected cardiac amyloidosis - appears euvolemic on exam today but weight is rising. Continue to trend. Note OP weight in 08/2016 was 215. . Will have to restart HF meds and diuretics gradually when appropriate. Valsartan prefered over captopril to allow return to [facility] in the future (stopped due to cost).   3. Elevated troponin - demand process rather than ACS. Minimal CAD only 6 months ago.  4. AKI on CKD stage III - repeat BMET pending.  5. Paroxysmal atrial tachycardia - may be due to stress of acute illness. If BP remains stable would consider addition of carvedilol 3.17m BID. Add thyroid function with next lab draw in AM.  6. Supraglottic mass (?possible laryngeal CA) - plans for upcoming surgery per ENT Dr. WErik Obey Per nurse, won't likely be today due to scheduling.  7. Electrolyte disturbance (hypernatremia/hypokalemia) - BMET still pending this AM, further management per primary team. Has required intermittent potassium repletion.  8. Acute on chronic anemia - per primary team. May need to consider holding ASA if needed.  Signed, DCharlie Pitter PA-C  10/16/2016, 8:26 AM   I have personally seen and examined this patient with DMelina Copa PA-C. I agree with the assessment and plan as outlined above. She is intubated but awake. My exam shows a WDWN  female. CV:RRR. Lungs: clear bilaterally. Ext: no edema. Labs reviewed. Ongoing treatment for pneumonia. Volume status is ok. Weight trending up. HF meds on hold. Possible PAT on monitor. No changes today from our standpoint.   CLauree Chandler11/27/2017 9:49 AM

## 2016-10-16 NOTE — Progress Notes (Signed)
Pharmacy Antibiotic Note  Mandy Rhodes is a 75 y.o. female admitted on 10/12/2016 with SOB,weakness, weight loss.  On admit she was found to have respiratory failure currently intubated, elevated troponin being worked-up for ACS.  CXR also + LLL   pneumonia. - Pharmacy has been consulted for Unasyn dosing. - PMH significant for CKD (baseline Scr ~ 1.8) and recent Z-pak completed in past 7 days as outpatient for PNA/bronchitis.  Today, 10/16/2016:  Day #4 unasyn  Afebrile, WBC wnl  CKD, scr trending up with 2.22 (crcl~24) on 11/26    Plan: - continue Unasyn 3gm IV q12h for CrCl<29ml/min - Monitor renal function and cx data   ___________________________   Height: 5\' 6"  (167.6 cm) Weight: 193 lb 5.5 oz (87.7 kg) IBW/kg (Calculated) : 59.3  Temp (24hrs), Avg:98 F (36.7 C), Min:97.5 F (36.4 C), Max:98.2 F (36.8 C)   Recent Labs Lab 10/12/16 2302 10/13/16 0310 10/13/16 0534 10/13/16 2108 10/13/16 2109 10/14/16 0404 10/15/16 0645 10/16/16 0332  WBC 12.0*  --  10.2 7.6  --  6.2 6.4 6.0  CREATININE 1.84*  --  1.85* 1.97*  --  2.18* 2.22*  --   LATICACIDVEN  --  2.4* 1.8  --  1.4  --   --   --     Estimated Creatinine Clearance: 24.4 mL/min (by C-G formula based on SCr of 2.22 mg/dL (H)).    Allergies  Allergen Reactions  . Sulfa Antibiotics Swelling    Antimicrobials this admission:  Unasyn 11/24 >>  Dose adjustments this admission:  11/24: Dec to q12h for CrCl <21ml/min  Microbiology results:  11/24 Blood Cx x2: 11/24 Urine Legionella Ag: neg 11/24 Urine Streptococcal Ag: neg 11/24 Respiratory Viral Panel PCR (Tracheal): neg 11/24 Tracheal Aspirate Cx: few GPC, GNR 11/24 MRSA PCR:  Negative  Thank you for allowing pharmacy to be a part of this patient's care.  Dia Sitter, PharmD, BCPS 10/16/2016 9:03 AM

## 2016-10-17 ENCOUNTER — Encounter (HOSPITAL_COMMUNITY)
Admission: EM | Disposition: A | Discharge status: Skilled Nursing Facility | Payer: Self-pay | Source: Home / Self Care | Attending: Pulmonary Disease

## 2016-10-17 ENCOUNTER — Inpatient Hospital Stay (HOSPITAL_COMMUNITY): Payer: Medicare Other | Admitting: Anesthesiology

## 2016-10-17 ENCOUNTER — Encounter (HOSPITAL_COMMUNITY): Payer: Self-pay | Admitting: Otolaryngology

## 2016-10-17 DIAGNOSIS — N189 Chronic kidney disease, unspecified: Secondary | ICD-10-CM

## 2016-10-17 HISTORY — PX: TRACHEOSTOMY TUBE PLACEMENT: SHX814

## 2016-10-17 HISTORY — PX: DIRECT LARYNGOSCOPY: SHX5326

## 2016-10-17 LAB — RENAL FUNCTION PANEL
ANION GAP: 8 (ref 5–15)
Albumin: 2.3 g/dL — ABNORMAL LOW (ref 3.5–5.0)
BUN: 54 mg/dL — ABNORMAL HIGH (ref 6–20)
CALCIUM: 9.3 mg/dL (ref 8.9–10.3)
CHLORIDE: 120 mmol/L — AB (ref 101–111)
CO2: 24 mmol/L (ref 22–32)
Creatinine, Ser: 1.94 mg/dL — ABNORMAL HIGH (ref 0.44–1.00)
GFR calc non Af Amer: 24 mL/min — ABNORMAL LOW (ref >60)
GFR, EST AFRICAN AMERICAN: 28 mL/min — AB (ref >60)
Glucose, Bld: 114 mg/dL — ABNORMAL HIGH (ref 65–99)
Phosphorus: 2.6 mg/dL (ref 2.5–4.6)
Potassium: 3.6 mmol/L (ref 3.5–5.1)
Sodium: 152 mmol/L — ABNORMAL HIGH (ref 135–145)

## 2016-10-17 LAB — T4, FREE: FREE T4: 1.28 ng/dL — AB (ref 0.61–1.12)

## 2016-10-17 LAB — TSH: TSH: 1.027 u[IU]/mL (ref 0.350–4.500)

## 2016-10-17 LAB — CBC
HEMATOCRIT: 26 % — AB (ref 36.0–46.0)
HEMOGLOBIN: 8.7 g/dL — AB (ref 12.0–15.0)
MCH: 29.1 pg (ref 26.0–34.0)
MCHC: 33.5 g/dL (ref 30.0–36.0)
MCV: 87 fL (ref 78.0–100.0)
Platelets: 108 10*3/uL — ABNORMAL LOW (ref 150–400)
RBC: 2.99 MIL/uL — ABNORMAL LOW (ref 3.87–5.11)
RDW: 16.6 % — AB (ref 11.5–15.5)
WBC: 5.2 10*3/uL (ref 4.0–10.5)

## 2016-10-17 LAB — GLUCOSE, CAPILLARY
GLUCOSE-CAPILLARY: 88 mg/dL (ref 65–99)
GLUCOSE-CAPILLARY: 106 mg/dL — AB (ref 65–99)
GLUCOSE-CAPILLARY: 140 mg/dL — AB (ref 65–99)
GLUCOSE-CAPILLARY: 127 mg/dL — AB (ref 65–99)
Glucose-Capillary: 108 mg/dL — ABNORMAL HIGH (ref 65–99)

## 2016-10-17 LAB — MAGNESIUM: Magnesium: 2.2 mg/dL (ref 1.7–2.4)

## 2016-10-17 SURGERY — LARYNGOSCOPY, DIRECT
Anesthesia: General | Site: Mouth

## 2016-10-17 MED ORDER — MIDAZOLAM HCL 2 MG/2ML IJ SOLN
INTRAMUSCULAR | Status: AC
  Filled 2016-10-17: qty 2

## 2016-10-17 MED ORDER — VANCOMYCIN HCL IN DEXTROSE 1-5 GM/200ML-% IV SOLN
1000.0000 mg | INTRAVENOUS | Status: DC
  Administered 2016-10-17 – 2016-10-21 (×5): 1000 mg via INTRAVENOUS
  Filled 2016-10-17 (×5): qty 200

## 2016-10-17 MED ORDER — PROPOFOL 10 MG/ML IV BOLUS
INTRAVENOUS | Status: AC
  Filled 2016-10-17: qty 20

## 2016-10-17 MED ORDER — FENTANYL CITRATE (PF) 100 MCG/2ML IJ SOLN
INTRAMUSCULAR | Status: DC | PRN
  Administered 2016-10-17 (×5): 50 ug via INTRAVENOUS

## 2016-10-17 MED ORDER — ROCURONIUM BROMIDE 10 MG/ML (PF) SYRINGE
PREFILLED_SYRINGE | INTRAVENOUS | Status: DC | PRN
  Administered 2016-10-17: 50 mg via INTRAVENOUS

## 2016-10-17 MED ORDER — FENTANYL CITRATE (PF) 250 MCG/5ML IJ SOLN
INTRAMUSCULAR | Status: AC
  Filled 2016-10-17: qty 5

## 2016-10-17 MED ORDER — CHLORHEXIDINE GLUCONATE CLOTH 2 % EX PADS
6.0000 | MEDICATED_PAD | Freq: Once | CUTANEOUS | Status: AC
  Administered 2016-10-17: 6 via TOPICAL

## 2016-10-17 MED ORDER — CHLORHEXIDINE GLUCONATE CLOTH 2 % EX PADS: 6.0000 | MEDICATED_PAD | Freq: Once | CUTANEOUS | Status: DC

## 2016-10-17 MED ORDER — LACTATED RINGERS IV SOLN
INTRAVENOUS | Status: DC | PRN
  Administered 2016-10-17: 08:00:00 via INTRAVENOUS

## 2016-10-17 MED ORDER — ONDANSETRON HCL 4 MG/2ML IJ SOLN
INTRAMUSCULAR | Status: AC
  Filled 2016-10-17: qty 2

## 2016-10-17 MED ORDER — ROCURONIUM BROMIDE 50 MG/5ML IV SOSY
PREFILLED_SYRINGE | INTRAVENOUS | Status: AC
  Filled 2016-10-17: qty 5

## 2016-10-17 MED ORDER — MIDAZOLAM HCL 5 MG/5ML IJ SOLN
INTRAMUSCULAR | Status: DC | PRN
  Administered 2016-10-17 (×2): 2 mg via INTRAVENOUS

## 2016-10-17 MED ORDER — LIDOCAINE 2% (20 MG/ML) 5 ML SYRINGE
INTRAMUSCULAR | Status: AC
  Filled 2016-10-17: qty 5

## 2016-10-17 MED ORDER — DEXTROSE 5 % IV SOLN
INTRAVENOUS | Status: DC
  Administered 2016-10-17 – 2016-10-18 (×3): via INTRAVENOUS
  Administered 2016-10-21: 50 mL via INTRAVENOUS
  Administered 2016-10-23: 08:00:00 via INTRAVENOUS

## 2016-10-17 MED ORDER — CHLORHEXIDINE GLUCONATE CLOTH 2 % EX PADS
6.0000 | MEDICATED_PAD | Freq: Once | CUTANEOUS | Status: AC
  Administered 2016-10-16: 6 via TOPICAL

## 2016-10-17 MED ORDER — ONDANSETRON HCL 4 MG/2ML IJ SOLN
INTRAMUSCULAR | Status: DC | PRN
  Administered 2016-10-17: 4 mg via INTRAVENOUS

## 2016-10-17 SURGICAL SUPPLY — 45 items
ATTRACTOMAT 16X20 MAGNETIC DRP (DRAPES) ×3 IMPLANT
BAG ZIPLOCK 12X15 (MISCELLANEOUS) ×3 IMPLANT
BALLN PULM 15 16.5 18X75 (BALLOONS) ×3
BALLOON PULM 15 16.5 18X75 (BALLOONS) ×2 IMPLANT
BLADE SURG 15 STRL LF DISP TIS (BLADE) ×2 IMPLANT
BLADE SURG 15 STRL SS (BLADE) ×1
CONT SPEC 4OZ CLIKSEAL STRL BL (MISCELLANEOUS) IMPLANT
CORDS BIPOLAR (ELECTRODE) IMPLANT
COVER SURGICAL LIGHT HANDLE (MISCELLANEOUS) ×3 IMPLANT
CRADLE DONUT ADULT HEAD (MISCELLANEOUS) IMPLANT
DRAPE PROXIMA HALF (DRAPES) IMPLANT
ELECT COATED BLADE 2.86 ST (ELECTRODE) ×3 IMPLANT
ELECT PENCIL ROCKER SW 15FT (MISCELLANEOUS) ×3 IMPLANT
ELECT REM PT RETURN 9FT ADLT (ELECTROSURGICAL) ×3
ELECTRODE REM PT RTRN 9FT ADLT (ELECTROSURGICAL) ×2 IMPLANT
GAUZE SPONGE 4X4 12PLY STRL (GAUZE/BANDAGES/DRESSINGS) ×3 IMPLANT
GAUZE SPONGE 4X4 16PLY XRAY LF (GAUZE/BANDAGES/DRESSINGS) ×6 IMPLANT
GLOVE ECLIPSE 8.0 STRL XLNG CF (GLOVE) ×3 IMPLANT
GOWN STRL REUS W/TWL XL LVL3 (GOWN DISPOSABLE) ×3 IMPLANT
GUARD TEETH (MISCELLANEOUS) ×3 IMPLANT
HOLDER TRACH TUBE VELCRO 19.5 (MISCELLANEOUS) ×3 IMPLANT
KIT BASIN OR (CUSTOM PROCEDURE TRAY) ×3 IMPLANT
MARKER SKIN DUAL TIP RULER LAB (MISCELLANEOUS) ×3 IMPLANT
NEEDLE HYPO 25X1 1.5 SAFETY (NEEDLE) ×3 IMPLANT
NS IRRIG 1000ML POUR BTL (IV SOLUTION) ×3 IMPLANT
PACK BASIC VI WITH GOWN DISP (CUSTOM PROCEDURE TRAY) ×3 IMPLANT
PACK EENT SPLIT (PACKS) ×3 IMPLANT
PAD ARMBOARD 7.5X6 YLW CONV (MISCELLANEOUS) ×6 IMPLANT
PAD TELFA 2X3 NADH STRL (GAUZE/BANDAGES/DRESSINGS) ×3 IMPLANT
PATTIES SURGICAL .5 X3 (DISPOSABLE) IMPLANT
SPONGE DRAIN TRACH 4X4 STRL 2S (GAUZE/BANDAGES/DRESSINGS) ×3 IMPLANT
SURGILUBE 2OZ TUBE FLIPTOP (MISCELLANEOUS) ×3 IMPLANT
SUT CHROMIC 2 0 SH (SUTURE) IMPLANT
SUT ETHILON 5 0 PS 2 18 (SUTURE) IMPLANT
SUT SILK 2 0 SH CR/8 (SUTURE) IMPLANT
SUT VIC AB 4-0 PS2 27 (SUTURE) IMPLANT
SYR 10ML LL (SYRINGE) ×3 IMPLANT
SYR CONTROL 10ML LL (SYRINGE) ×3 IMPLANT
SYR INFLATE BILIARY GAUGE (MISCELLANEOUS) ×3 IMPLANT
TOWEL OR 17X24 6PK STRL BLUE (TOWEL DISPOSABLE) ×6 IMPLANT
TOWEL OR 17X26 10 PK STRL BLUE (TOWEL DISPOSABLE) ×6 IMPLANT
TRAP SPECIMEN MUCOUS 40CC (MISCELLANEOUS) IMPLANT
TUBE CONNECTING 12X1/4 (SUCTIONS) ×3 IMPLANT
WATER STERILE IRR 1500ML POUR (IV SOLUTION) ×3 IMPLANT
YANKAUER SUCT BULB TIP 10FT TU (MISCELLANEOUS) ×3 IMPLANT

## 2016-10-17 NOTE — Transfer of Care (Signed)
Immediate Anesthesia Transfer of Care Note  Patient: Mandy Rhodes  Procedure(s) Performed: Procedure(s): DIRECT LARYNGOSCOPY,  ESOPHAGOSCOPY (N/A) TRACHEOSTOMY (N/A)  Patient Location: ICU  Anesthesia Type:General  Level of Consciousness: sedated and Patient remains intubated per anesthesia plan  Airway & Oxygen Therapy: Patient remains intubated per anesthesia plan and Patient placed on Ventilator (see vital sign flow sheet for setting)  Post-op Assessment: Report given to RN and Post -op Vital signs reviewed and stable  Post vital signs: Reviewed and stable  Last Vitals:  Vitals:   10/17/16 0700 10/17/16 0720  BP:    Pulse: 62 (!) 44  Resp: 14 14  Temp: 36.5 C 36.5 C    Last Pain:  Vitals:   10/16/16 1525  TempSrc:   PainSc: 0-No pain         Complications: No apparent anesthesia complications

## 2016-10-17 NOTE — Progress Notes (Signed)
PULMONARY / CRITICAL CARE MEDICINE   Name: Mandy Rhodes MRN: [id number] DOB: [birth year]    ADMISSION DATE:  10/12/2016 CONSULTATION DATE:  10/13/2016  REFERRING MD:  Dr. Roxanne Mins EDP  CHIEF COMPLAINT:  Shortness of breath  HISTORY OF PRESENT ILLNESS:  75 y.o. female with past medical history as below, which is significant for diabetes, CHF, chronic kidney disease, and hypertension. She presented to [facility] emergency department 10/12/2016 with complaints of shortness of breath and generalized weakness for about 2 weeks. She has additionally suffered a 30 pound weight loss over some recent period of time which is not entirely clear. Recently treated with antibiotics as an outpatient for suspected bronchitis vs pneumonia.Upon arrival to the emergency department she was on 4 L of supplemental O2 via nasal cannula and still had increased work of breathing.due to her somnolence ABG was ordered which showed  Hypercapnic respiratory failure and she was started on BiPAP. She was known to be a DO NOT RESUSCITATE, however,  Family has decided to proceed with intubation, which was done in the emergency department. During intubation a supraglottic mass was noted by emergency room provider which made intubation difficult. Laboratory evaluation significant forserum creatinine 1.8, calcium 10.5, troponin 0.53, WBC 12, glucose 137.PCCM asked to admit  SUBJECTIVE: Now back from surgery C-plate perforated thru esophagus  REVIEW OF SYSTEMS:  Unable to obtain due to intubation.  VITAL SIGNS: BP 121/62   Pulse 73   Temp 97.7 F (36.5 C)   Resp 14   Ht 5\' 6"  (1.676 m)   Wt 193 lb 5.5 oz (87.7 kg)   SpO2 100%   BMI 31.21 kg/m   HEMODYNAMICS:    VENTILATOR SETTINGS: Vent Mode: PRVC FiO2 (%):  [30 %] 30 % Set Rate:  [14 bmp] 14 bmp Vt Set:  [470 mL] 470 mL PEEP:  [5 cmH20] 5 cmH20 Plateau Pressure:  [18 cmH20-26 cmH20] 20 cmH20  INTAKE / OUTPUT: I/O last 3 completed shifts: In: 1828  [I.V.:140; NG/GT:1388; IV Piggyback:300] Out: 1240 [Urine:1240]  PHYSICAL EXAMINATION: General:  Eyes open. Multiple family members at bedside. No distress. Neuro:  Following commands. Grossly nonfocal. Moving all extremities equally. HEENT:  Endotracheal tube in place. No scleral icterus or injection. Cardiovascular:  Regular rate and rhythm. No edema. No appreciable JVD. Pulmonary: Clear with auscultation bilaterally. Occasional rhonchi. Symmetric chest wall rise on ventilator. Abdomen:  Soft. Protuberant. Normal bowel sounds. Musculoskeletal:  No joint effusion or deformity appreciated. Skin:  Warm and dry. no rash on exposed skin.  LABS:  BMET  Recent Labs Lab 10/15/16 0645 10/16/16 0332 10/17/16 0445  NA 148* 149* 152*  K 3.4* 3.6 3.6  CL 117* 117* 120*  CO2 24 26 24   BUN 61* 60* 54*  CREATININE 2.22* 2.12* 1.94*  GLUCOSE 203* 198* 114*    Electrolytes  Recent Labs Lab 10/15/16 0645 10/16/16 0332 10/17/16 0445  CALCIUM 9.2 9.2 9.3  MG 2.4 2.4 2.2  PHOS 3.2 2.4* 2.6    CBC  Recent Labs Lab 10/15/16 0645 10/16/16 0332 10/17/16 0445  WBC 6.4 6.0 5.2  HGB 8.8* 8.7* 8.7*  HCT 26.5* 26.6* 26.0*  PLT 127* 127* 108*    Coag's  Recent Labs Lab 10/13/16 0058  APTT 32  INR 1.22    Sepsis Markers  Recent Labs Lab 10/13/16 0310 10/13/16 0534 10/13/16 2109 10/14/16 0404 10/15/16 0645  LATICACIDVEN 2.4* 1.8 1.4  --   --   PROCALCITON <0.10  --   --  <  0.10 <0.10    ABG  Recent Labs Lab 10/13/16 0202 10/13/16 0443 10/16/16 1205  PHART 7.539* 7.509* 7.429  PCO2ART 29.8* 33.5 40.4  PO2ART 51.1* 53.4* 108    Liver Enzymes  Recent Labs Lab 10/12/16 2302  10/15/16 0645 10/16/16 0332 10/17/16 0445  AST 33  --   --   --   --   ALT 30  --   --   --   --   ALKPHOS 97  --   --   --   --   BILITOT 1.0  --   --   --   --   ALBUMIN 3.3*  < > 2.5* 2.3* 2.3*  < > = values in this interval not displayed.  Cardiac Enzymes  Recent Labs Lab  10/13/16 0310 10/13/16 1033 10/13/16 1716  TROPONINI 0.64* 0.87* 1.09*    Glucose  Recent Labs Lab 10/16/16 0738 10/16/16 1150 10/16/16 1631 10/16/16 1934 10/16/16 2355 10/17/16 0340  GLUCAP 159* 168* 124* 94 104* 88    Imaging Dg Chest Port 1 View  Result Date: 10/16/2016 CLINICAL DATA:  Hypoxia EXAM: PORTABLE CHEST 1 VIEW COMPARISON:  October 16, 2016 FINDINGS: Endotracheal tube tip is 3.3 cm above the carina. No pneumothorax. No edema or consolidation. Heart is prominent with pulmonary vascularity within normal limits, stable. No adenopathy. There is extensive arthropathy in each shoulder. IMPRESSION: Endotracheal tube as described without pneumothorax. Stable cardiac prominence. No edema or consolidation. Electronically Signed   By: Lowella Grip III M.D.   On: 10/16/2016 15:12   Dg Chest Port 1 View  Result Date: 10/16/2016 CLINICAL DATA:  Pneumonia EXAM: PORTABLE CHEST 1 VIEW COMPARISON:  10/13/2016 FINDINGS: Endotracheal tube remains in place, unchanged. Interval removal of NG tube. There is cardiomegaly. Improving aeration at the left base with mild residual atelectasis or infiltrate. No confluent opacity on the right. No effusions. IMPRESSION: Cardiomegaly. Improving left base aeration with mild residual atelectasis or infiltrate. Electronically Signed   By: Rolm Baptise M.D.   On: 10/16/2016 12:07    STUDIES:  CT CHEST/NECK/SOFT TISSUE W/O 11/24: IMPRESSION: 1. Fluid and debris throughout the oropharynx, hypopharynx, and supraglottic airway from intubation. Suboptimal evaluation of the mucosa due to airway opacification and lack of contrast. Follow-up contrast examination after extubation and/or direct visualization recommended for evaluation of the supraglottic mass. 2. No cervical lymphadenopathy identified. 3. Left lower lobe consolidation and nodular opacities throughout the lungs compatible with pneumonia. Small left pleural effusion. 4. Endotracheal tube 1  cm from carina, retraction recommended. 5. Enteric tube tip below the field of view in the stomach. 6. Moderate coronary artery calcifications. 7. Borderline main pulmonary artery may represent pulmonary artery hypertension. CT HEAD W/O 11/24:  No acute intracranial abnormality. Moderate brain parenchymal atrophy and chronic microvascular disease. TTE 11/24: LV normal in size with EF 30-35%. Moderate concentric LVH. Appearance consistent with infiltrative cardiomyopathy. Moderate diffuse hypokinesis without regional variation. Grade 2 diastolic dysfunction. LA moderately dilated & RA normal in size. RV normal in size and function. No aortic stenosis or regurgitation. Aortic root normal in size. Trivial mitral regurgitation without stenosis. No pulmonic regurgitation. Mild tricuspid regurgitation. No pericardial effusion. 11/28: Direct laryngoscopy, esophagoscopy: Laryngeal and pharyngeal edema.  2x2 cm area of exposed cervical esophageal fusion plate just behind cricopharyngeus. 20-30 mL frank pus released. No cancer identified  MICROBIOLOGY: MRSA PCR 11/24:  Negative Blood Ctx x2 11/24 >>  Urine Legionella Ag 11/24 >> Urine Streptococcal Ag 11/24:  Negative Respiratory Viral Panel  PCR 11/24 (Tracheal):  Negative Tracheal Aspirate Ctx 11/24 >>  ANTIBIOTICS: Unasyn 11/24 >>  vanc 11/28>>>  SIGNIFICANT EVENTS: 11/24 - intubated, admitted to ICU.  LINES/TUBES: OETT 7.5 11/23 >> OGT 11/23 >> FOLEY 11/23 >> PIV  ASSESSMENT / PLAN:  INFECTIOUS A:   Possible Sepsis Possible Severe Community Acquired Pneumonia Left Lower Lobe (NPF)  P:   Empiric Unasyn Day #4; adding Vanc given cervical fusion plate exposure  Trending Procalcitonin per Algorithm Have called neuro-surgery. Awaiting call back. Will likely need to be combined n-surg and ENT procedure.   PULMONARY A: Acute Hypercarbic Respiratory Failure Aspiration PNA  H/O Tobacco Use - Cigarettes & chewing tobacco  P:   Full  ventilator support Intermittent ABG & Port CXR (today as last obtained on 24th) Vap Bundle Xopenex & Atrovent Nebs q6hr Scheduled Hold off on extubation until we have surgical plan  CARDIOVASCULAR A:  NSTEMI/Elevated Troponin I - S/P 300mg  Rectal ASA. SVT - Seen since admission. Borderline Hypotension - Likely due to hypovolemia.  Probable Infiltrative Cardiomyopathy - Seen on TTE. Question amyloidosis.  H/O CHF H/O HTN H/o Hyperlipidemia  P:  Continuous Telemetry Monitoring Vitals per Unit Protocol Cardiology Consulted & appreciate recommendations ASA 81mg  VT daily (on hold for now) Holding  home captopril, pravastatin Holding home Lasix  RENAL A:   Acute Renal Failure - now improving  Lactic Acidosis - Resolved. Mild. Hypokalemia - Resolved. Hypernatremia  Worse  Hyperchloremia - Stable. H/O CKD Stage   P:   Trending UOP with Foley Monitoring renal function & electrolytes daily Replacing electrolytes as indicated Start D5W @ 44ml/hr  GASTROINTESTINAL A:   Moderate Protein-Calorie Malnutrition H/O GERD  P:   NPO Start H2B IV again as NPO Leave OGT out per n-surg request   HEMATOLOGIC/ONCOLOGIC A:   Anemia - No signs of active bleeding. Chronic w/ Hgb 10.4-11.1. Supraglottic Mass - Not appreciated w/ noncontrast neck CT.  P:  Trending Cell Counts daily w/ CBC Heparin gtt per Pharmacy Protocol  FeSO4 daily-->holding    ENDOCRINE A:   H/O Diabetes Mellitus Type 2 - A1c 7.2.  P:   SSI per Sensitive Algorithm Accu-Checks q4hr Holding home glimepiride  NEUROLOGIC A:   Acute Encephalopathy - Likely secondary to hypercarbia vs toxic metabolic. Sedation on Ventilator H/O Neuropathy - Secondary to diabetes.  P:   RASS goal: 0 to -1 Versed IV prn Sedation Fentanyl IV prn Pain/Discomfort. Holding home Nortriptyline   FAMILY  - Updates: Family updated by Dr. Ashok Cordia 11/25 at bedside.  - Inter-disciplinary family meet or Palliative Care meeting  due by:  11/30   TODAY'S SUMMARY:  75 y.o. female known to have chronic kidney disease not on dialysis, known to have chronic systolic and diastolic congestive heart failure, presenting with 2 week history of shortness of breath, worsening dysphagia. Admitted w/ what was likely an aspiration event. She went to the OR today. They found "2x2 cm area of exposed cervical esophageal fusion plate just behind cricopharyngeus. 20-30 mL frank pus released". For today plan: continue supportive care on vent, add vanc for additional coverage given concern about hardware, have placed call to neuro-surg. Will try to arrange transfer to [facility] if this is something our Neuro-surgeons can address here vs tertiary transfer depending on neuro-surgical opinion. This will likely require a collaborative effort of neuro-surg and ENT.   My critical care time 80 minutes.   Erick Colace ACNP-BC [place] Pager # [phone] OR # [phone] if no answer

## 2016-10-17 NOTE — Progress Notes (Signed)
Nutrition Follow-up  DOCUMENTATION CODES:   Non-severe (moderate) malnutrition in context of acute illness/injury  INTERVENTION:  - RD will follow-up 11/29 and will adjust TF accordingly if able to re-start TF by that time.  NUTRITION DIAGNOSIS:   Inadequate oral intake related to poor appetite, inability to eat as evidenced by per patient/family report, NPO status. -ongoing  GOAL:   Patient will meet greater than or equal to 90% of their needs -unmet with TF currently held.  MONITOR:   Vent status, Weight trends, Labs, Skin, I & O's, Other (Comment) (Ability to re-start TF)  ASSESSMENT:   75 year old female with past medical history as below, which is significant for diabetes, CHF, chronic kidney disease, and hypertension. She presented to [facility] emergency department 10/12/2016 with complaints of shortness of breath and generalized weakness for about 2 weeks. She has additionally suffered a 30 pound weight loss over some recent period of time which is not entirely clear. Recently treated with antibiotics as an outpatient for suspected bronchitis vs pneumonia.Upon arrival to the emergency department she was on 4 L of supplemental O2 via nasal cannula and still had increased work of breathing.due to her somnolence ABG was ordered which showed  Hypercapnic respiratory failure and she was started on BiPAP. She was known to be a DO NOT RESUSCITATE, however,  Family has decided to proceed with intubation, which was done in the emergency department. During intubation a supraglottic mass was noted by emergency room provider which made intubation difficult.  11/28 Pt remains intubated. Per notes, pt with vomiting and oral suctioning (with TF residual) late yesterday AM. This AM pt had DIRECT LARYNGOSCOPY WITH BIOPSY POSSIBLE BRONCHOSCOPY POSSIBLE ESOPHAGOSCOPY (N/A) TRACHEOSTOMY.  Per Dr. Noreene Filbert note today at [phone]: The neck was very kyphotic but was well supported. Moist 4 x 4  was used to protect the upper alveolus. Taking care to protect lips, teeth, and endotracheal tube, the anterior commissure laryngoscope was introduced. Careful inspection of the supraglottic larynx revealed edema but no tumor. There was frank pus in the pharynx. Inspecting from the pyriform sinuses to the postcricoid region, a frank perforation with a fusion plate visible possibly 2 x 2 centimeters with frank pus emanating there from was noted.  Pt to transfer to [facility] today for possible surgery this afternoon by Dr. Trenton Gammon. Talked with RD who will likely be following pt at [facility] to update her on patient. Spoke with CCM NP who states TF would be okay to restart but that OGT no longer in place and plan not to replace tube until after surgery.   Patient is currently intubated on ventilator support MV: 7 L/min Temp (24hrs), Avg:97.5 F (36.4 C), Min:97 F (36.1 C), Max:97.9 F (36.6 C) Propofol: none BP: 112/55 and MAP: 71.  Medications reviewed; sliding scale Novolog, PRN IV Zofran, 40 mg Protonix/day. Labs reviewed; CBG: 88 mg/dL this AM, Na: 152 mmol/L (up from yesterday), Cl: 120 mmol/L (up from yesterday), BUN: 54 mg/dL (trending down), creatinine: 1.94 mg/dL (trending down), GFR: 28 mL/min.   IVF: D5 @ 60 mL/hr (245 kcal).     11/27 - Consult for TF received on 11/25 and TF ordered per recommendation on 11/24 (Vital 1.2 @ 25 mL/hr and increase by 10 mL every 8 hours to reach goal of Vital 1.2 @ 55 mL/hr).  - Estimated nutrition needs updated this AM and based on weight from 11/24 (81.4 kg) as weight +6.3 kg since that time. - Pt with OGT and currently receiving Vital  1.2 @ 55 mL/hr which is providing 1584 kcal (111% re-estimated kcal need), 99 grams of protein, and 1071 mL free water.  - Will adjust TF regimen to more appropriately meet estimated needs.  - Current order for 200 mL free water TID (600 mL free water/day; not programmed into pump at time of RD visit).  - Per MD note yesterday  at 1459: medically cleared for endoscopy and biopsy.   Patient is currently intubated on ventilator support MV: 7 L/min Temp (24hrs), Avg:98 F (36.7 C), Min:97.5 F (36.4 C), Max:98.2 F (36.8 C) Propofol: none BP: 120/65 and MAP: 82.    11/24 - Pt is intubated with OGT in place with ~300cc dark drainage present at time of RD visit.  - Pt's daughter-in-law is at bedside and provides all information. - She states that she lives out of town and the information that she provided to RD was provided to her over the phone by the pt PTA.  - For ~2 weeks pt had poor appetite/was eating less and also stated that it was difficult to swallow solid foods and liquids (noted in H&P note that intubation was difficult d/t supraglottic mass).  - Daughter-in-law states that she can tell that the pt lost weight by visualizing her but she is unsure of pt's UBW or how much weight pt may have lost.  - Physical assessment shows mild fat wasting around orbital area but no other muscle or fat wasting noted; mild edema to BLE.  - Per chart review, pt has lost 7 lbs (4% body weight) in the past 18 days which is significant for time frame.  - Pt meets criteria based on weight loss and estimated <75% intakes for >/=7 days PTA.   Patient is currently intubated on ventilator support MV: 6.7L/min Temp (24hrs), Avg:95.7 F (35.4 C), Min:90.9 F (32.7 C), Max:99.9 F (37.7 C) Propofol: none Drip:Heparin @ 1000 units/hr.    Diet Order:  Diet NPO time specified  Skin:  Wound (see comment) (Stage 2 L buttocks pressure injury)  Last BM:  11/27  Height:   Ht Readings from Last 1 Encounters:  10/13/16 5\' 6"  (1.676 m)    Weight:   Wt Readings from Last 1 Encounters:  10/16/16 193 lb 5.5 oz (87.7 kg)    Ideal Body Weight:  59.09 kg  BMI:  Body mass index is 31.21 kg/m.  Estimated Nutritional Needs:   Kcal:  1395  Protein:  98-122 grams (1.2-1.5 grams/kg)  Fluid:  1.5-1.7  L/day  EDUCATION NEEDS:   No education needs identified at this time    Jarome Matin, MS, RD, LDN, CNSC Inpatient Clinical Dietitian Pager # [phone] After hours/weekend pager # [phone]

## 2016-10-17 NOTE — Progress Notes (Signed)
Patient Name: Mandy Rhodes Date of Encounter: 10/17/2016  Primary Cardiologist: Dr. [facility] Problem List     Principal Problem:   Sepsis [facility]) Active Problems:   Essential hypertension   Type 2 diabetes mellitus with stage 3 chronic kidney disease, without long-term current use of insulin (HCC)   Chronic combined systolic and diastolic heart failure (HCC)   Non-ischemic cardiomyopathy (HCC)   Acute respiratory failure (HCC)   Acute kidney injury superimposed on CKD (HCC)   Pressure injury of skin   PAT (paroxysmal atrial tachycardia) (HCC)   Malnutrition of moderate degree   Laryngeal mass   Hyperosmolality and/or hypernatremia   Anemia   Hypokalemia   Demand ischemia ([place])   CAP (community acquired pneumonia)    Subjective   For laryngoscopy with biopsy this AM.   Inpatient Medications    Scheduled Meds: . ampicillin-sulbactam (UNASYN) IV  3 g Intravenous Q12H  . aspirin  81 mg Per Tube Daily  . chlorhexidine gluconate (MEDLINE KIT)  15 mL Mouth Rinse BID  . Chlorhexidine Gluconate Cloth  6 each Topical Once  . Chlorhexidine Gluconate Cloth  6 each Topical Once  . feeding supplement (PRO-STAT SUGAR FREE 64)  30 mL Per Tube BID  . ferrous sulfate  325 mg Oral Daily  . free water  200 mL Per Tube Q4H  . heparin subcutaneous  5,000 Units Subcutaneous Q8H  . insulin aspart  0-9 Units Subcutaneous Q4H  . ipratropium  0.5 mg Nebulization Q6H  . levalbuterol  0.63 mg Nebulization Q6H  . mouth rinse  15 mL Mouth Rinse QID  . pantoprazole sodium  40 mg Per Tube Daily  . pravastatin  40 mg Oral Daily  . sodium chloride HYPERTONIC  4 mL Nebulization BID   Continuous Infusions:  PRN Meds: sodium chloride, acetaminophen, fentaNYL (SUBLIMAZE) injection, midazolam, ondansetron (ZOFRAN) IV   Vital Signs    Vitals:   10/17/16 0300 10/17/16 0400 10/17/16 0500 10/17/16 0600  BP:      Pulse: 63 67 60 60  Resp: _0 Temp: 97.9 F (36.6 C) 97.3 F  (36.3 C) 97.2 F (36.2 C) 97.3 F (36.3 C)  TempSrc:      SpO2: 100% 100% 100% 100%  Weight:      Height:        Intake/Output Summary (Last 24 hours) at 10/17/16 0733 Last data filed at 10/17/16 0500  Gross per 24 hour  Intake              648 ml  Output              790 ml  Net             -142 ml   Filed Weights   10/14/16 0500 10/15/16 0600 10/16/16 0418  Weight: 183 lb 6.8 oz (83.2 kg) 188 lb 11.4 oz (85.6 kg) 193 lb 5.5 oz (87.7 kg)    Physical Exam    GEN: Well nourished, well developed, female appearing in no acute distress.  HEENT: Grossly normal.  Neck: Supple, no JVD, carotid bruits, or masses. Cardiac: RRR, no murmurs, rubs, or gallops. No clubbing, cyanosis, edema.  Radials/DP/PT 2+ and equal bilaterally.  Respiratory:  Respirations regular and unlabored, clear to auscultation bilaterally. GI: Soft, nontender, nondistended, BS + x 4. MS: no deformity or atrophy. Skin: warm and dry, no rash. Neuro:  Strength and sensation are intact. Awakes on vent.  Psych: AAOx3.  Normal affect.  Labs    CBC  Recent Labs  10/15/16 0645 10/16/16 0332 10/17/16 0445  WBC 6.4 6.0 5.2  NEUTROABS 4.5 4.1  --   HGB 8.8* 8.7* 8.7*  HCT 26.5* 26.6* 26.0*  MCV 85.8 87.5 87.0  PLT 127* 127* 073*   Basic Metabolic Panel  Recent Labs  10/16/16 0332 10/17/16 0445  NA 149* 152*  K 3.6 3.6  CL 117* 120*  CO2 26 24  GLUCOSE 198* 114*  BUN 60* 54*  CREATININE 2.12* 1.94*  CALCIUM 9.2 9.3  MG 2.4 2.2  PHOS 2.4* 2.6   Liver Function Tests  Recent Labs  10/16/16 0332 10/17/16 0445  ALBUMIN 2.3* 2.3*   Thyroid Function Tests  Recent Labs  10/17/16 0445  TSH 1.027    Telemetry    NSR, HR in 50's - 70's. Episodes of PAT (HR max in low-100's) and frequent PVC's- Personally Reviewed  ECG    No new tracings.  - Personally Reviewed  Radiology    Dg Chest Port 1 View  Result Date: 10/16/2016 CLINICAL DATA:  Hypoxia EXAM: PORTABLE CHEST 1 VIEW  COMPARISON:  October 16, 2016 FINDINGS: Endotracheal tube tip is 3.3 cm above the carina. No pneumothorax. No edema or consolidation. Heart is prominent with pulmonary vascularity within normal limits, stable. No adenopathy. There is extensive arthropathy in each shoulder. IMPRESSION: Endotracheal tube as described without pneumothorax. Stable cardiac prominence. No edema or consolidation. Electronically Signed   By: Lowella Grip III M.D.   On: 10/16/2016 15:12   Dg Chest Port 1 View  Result Date: 10/16/2016 CLINICAL DATA:  Pneumonia EXAM: PORTABLE CHEST 1 VIEW COMPARISON:  10/13/2016 FINDINGS: Endotracheal tube remains in place, unchanged. Interval removal of NG tube. There is cardiomegaly. Improving aeration at the left base with mild residual atelectasis or infiltrate. No confluent opacity on the right. No effusions. IMPRESSION: Cardiomegaly. Improving left base aeration with mild residual atelectasis or infiltrate. Electronically Signed   By: Rolm Baptise M.D.   On: 10/16/2016 12:07    Cardiac Studies   Echocardiogram: 10/13/2016 Study Conclusions  - Left ventricle: The cavity size was normal. There was moderate   concentric hypertrophy. The appearance was consistent with   hypertensive changes or infiltrative cardiomyopathy, such as   amyloidosis. Systolic function was moderately to severely   reduced. The estimated ejection fraction was in the range of 30%   to 35%. Moderate diffuse hypokinesis with no identifiable   regional variations. Features are consistent with a pseudonormal   left ventricular filling pattern, with concomitant abnormal   relaxation and increased filling pressure (grade 2 diastolic   dysfunction). - Mitral valve: Valve area by pressure half-time: 2.42 cm^2. - Left atrium: The atrium was moderately dilated. - Pulmonary arteries: Systolic pressure was mildly increased. PA   peak pressure: 39 mm Hg (S).  Patient Profile   75 yo F w/ PMH of chronic  combined CHF due to infiltrative cardiomyopathy, presumed cardiac amyloidosis (by MRI with prior nondiagnostic fat pad biopsy), minimal CAD (by cath 03/2016), DM, HTN, CKD stage III, admitted with sepsis/acute respiratory failure due to LLL pneumonia requiring ventilation, complicated by presence of laryngeal mass, recurrent PAT, elevated troponin, acute encephalopathy, AKI on CKD, and acute on chronic anemia. 2D echo 10/13/16: mod LVH, EF 30-35%, grade 2 DD, mod LAE, PASP 39.  Assessment & Plan    1. Sepsis/LLL PNA  - per admitting team.  2. NICM/chronic combined CHF due to suspected cardiac amyloidosis  - appears euvolemic on  exam today but weight is rising (186 lbs on admission - 193 lbs on 11/27). Continue to trend. Note OP weight in 08/2016 was 215. . Will have to restart HF meds and diuretics gradually when appropriate. Valsartan prefered over captopril to allow return to [facility] in the future (stopped due to cost).   3. Elevated troponin  - cyclic values at 4.30, 1.48, 0.87, and 1.09 - most likely secondary to demand ischemia in the setting of her acute illness. Minimal CAD by cath in 03/2016.  4. AKI on CKD stage III  - creatinine peaked at 2.22 on 10/15/2016. Trending down to 1.94 today.   5. Paroxysmal atrial tachycardia  - may be due to stress of acute illness. TSH WNL. Can consider addition of BB but with baseline HR in mid-50's at times, would need to monitor HR response closely.   6. Supraglottic mass (?possible laryngeal CA): Per ENT.    7. Electrolyte disturbance (hypernatremia/hypokalemia)  - per admitting team  8. Acute on chronic anemia  -per primary team. Hgb stable at 8.7. May need to consider holding ASA    Signed, Erma Heritage, [place]  10/17/2016, 7:33 AM   I have personally seen and examined this patient with Bernerd Pho, PA-C. I agree with the assessment and plan as outlined above. She is intubated and sedated. My exam shows a WDWN female.  CV:RRR. Lungs: clear bilaterally. Ext: no edema. Labs reviewed. Ongoing treatment for pneumonia. Volume status is ok. Weight trending up. Heart failure meds on hold. She has had runs of SVT and PVCs. Stable this am. No changes today from our standpoint.   Lauree Chandler 10/17/2016 10:39 AM

## 2016-10-17 NOTE — H&P (View-Only) (Signed)
10/15/2016 2:59 PM  Rae Mar [id number]  hosp  Day 3    Temp:  [97.7 F (36.5 C)-99 F (37.2 C)] 97.7 F (36.5 C) (11/26 1100) Pulse Rate:  [66-114] 108 (11/26 1100) Resp:  [8-20] 16 (11/26 1100) BP: (95-106)/(53-57) 95/55 (11/26 0305) SpO2:  [97 %-100 %] 100 % (11/26 1100) Arterial Line BP: (98-117)/(51-65) 104/59 (11/26 0900) FiO2 (%):  [25 %-30 %] 30 % (11/26 1051) Weight:  [85.6 kg (188 lb 11.4 oz)] 85.6 kg (188 lb 11.4 oz) (11/26 0600),     Intake/Output Summary (Last 24 hours) at 10/15/16 1459 Last data filed at 10/15/16 0600  Gross per 24 hour  Intake          1464.84 ml  Output              480 ml  Net           984.84 ml    Results for orders placed or performed during the hospital encounter of 10/12/16 (from the past 24 hour(s))  Heparin level (unfractionated)     Status: None   Collection Time: 10/14/16  3:00 PM  Result Value Ref Range   Heparin Unfractionated 0.66 0.30 - 0.70 IU/mL  Glucose, capillary     Status: None   Collection Time: 10/14/16  3:20 PM  Result Value Ref Range   Glucose-Capillary 98 65 - 99 mg/dL   Comment 1 Notify RN    Comment 2 Document in Chart   Glucose, capillary     Status: Abnormal   Collection Time: 10/14/16  8:26 PM  Result Value Ref Range   Glucose-Capillary 122 (H) 65 - 99 mg/dL   Comment 1 Notify RN    Comment 2 Document in Chart   Heparin level (unfractionated)     Status: None   Collection Time: 10/14/16 11:00 PM  Result Value Ref Range   Heparin Unfractionated 0.63 0.30 - 0.70 IU/mL  Glucose, capillary     Status: Abnormal   Collection Time: 10/14/16 11:25 PM  Result Value Ref Range   Glucose-Capillary 134 (H) 65 - 99 mg/dL   Comment 1 Notify RN    Comment 2 Document in Chart   Glucose, capillary     Status: Abnormal   Collection Time: 10/15/16  3:49 AM  Result Value Ref Range   Glucose-Capillary 151 (H) 65 - 99 mg/dL   Comment 1 Notify RN    Comment 2 Document in Chart   Magnesium     Status: None    Collection Time: 10/15/16  6:45 AM  Result Value Ref Range   Magnesium 2.4 1.7 - 2.4 mg/dL  Procalcitonin     Status: None   Collection Time: 10/15/16  6:45 AM  Result Value Ref Range   Procalcitonin <0.10 ng/mL  Renal function panel     Status: Abnormal   Collection Time: 10/15/16  6:45 AM  Result Value Ref Range   Sodium 148 (H) 135 - 145 mmol/L   Potassium 3.4 (L) 3.5 - 5.1 mmol/L   Chloride 117 (H) 101 - 111 mmol/L   CO2 24 22 - 32 mmol/L   Glucose, Bld 203 (H) 65 - 99 mg/dL   BUN 61 (H) 6 - 20 mg/dL   Creatinine, Ser 2.22 (H) 0.44 - 1.00 mg/dL   Calcium 9.2 8.9 - 10.3 mg/dL   Phosphorus 3.2 2.5 - 4.6 mg/dL   Albumin 2.5 (L) 3.5 - 5.0 g/dL   GFR calc non Af Amer 20 (L) >60  mL/min   GFR calc Af Amer 24 (L) >60 mL/min   Anion gap 7 5 - 15  CBC with Differential/Platelet     Status: Abnormal   Collection Time: 10/15/16  6:45 AM  Result Value Ref Range   WBC 6.4 4.0 - 10.5 K/uL   RBC 3.09 (L) 3.87 - 5.11 MIL/uL   Hemoglobin 8.8 (L) 12.0 - 15.0 g/dL   HCT 26.5 (L) 36.0 - 46.0 %   MCV 85.8 78.0 - 100.0 fL   MCH 28.5 26.0 - 34.0 pg   MCHC 33.2 30.0 - 36.0 g/dL   RDW 16.5 (H) 11.5 - 15.5 %   Platelets 127 (L) 150 - 400 K/uL   Neutrophils Relative % 72 %   Neutro Abs 4.5 1.7 - 7.7 K/uL   Lymphocytes Relative 15 %   Lymphs Abs 1.0 0.7 - 4.0 K/uL   Monocytes Relative 12 %   Monocytes Absolute 0.8 0.1 - 1.0 K/uL   Eosinophils Relative 1 %   Eosinophils Absolute 0.1 0.0 - 0.7 K/uL   Basophils Relative 0 %   Basophils Absolute 0.0 0.0 - 0.1 K/uL  Glucose, capillary     Status: Abnormal   Collection Time: 10/15/16  8:15 AM  Result Value Ref Range   Glucose-Capillary 178 (H) 65 - 99 mg/dL   Comment 1 Notify RN    Comment 2 Document in Chart   Type and screen     Status: None   Collection Time: 10/15/16  9:55 AM  Result Value Ref Range   ABO/RH(D) A POS    Antibody Screen NEG    Sample Expiration 10/18/2016   ABO/Rh     Status: None   Collection Time: 10/15/16  9:55 AM   Result Value Ref Range   ABO/RH(D) A POS   Glucose, capillary     Status: Abnormal   Collection Time: 10/15/16 12:20 PM  Result Value Ref Range   Glucose-Capillary 179 (H) 65 - 99 mg/dL   Comment 1 Notify RN    Comment 2 Document in Chart     IMPRESSION:  Medically cleared for endoscopy and biopsy.  Will need to be off heparin.  I will begin coordinating with OR for times.  PLAN:  For OR when cleared per Anesthesia and time available.  Had discussed with pt and family at length yesterday.   [name]

## 2016-10-17 NOTE — Brief Op Note (Signed)
10/12/2016 - 10/17/2016  8:41 AM  PATIENT:  Mandy Rhodes  75 y.o. female  PRE-OPERATIVE DIAGNOSIS:  laryngeal mass  POST-OPERATIVE DIAGNOSIS:  Cervical Esphageal perforation  PROCEDURE:  Direct laryngoscopy, esophagoscopy  SURGEON:  Surgeon(s) and Role:    [name], MD - Primary  ANESTHESIA:  GOT per indwelling OT tube  EBL: none   Findings:  Laryngeal and pharyngeal edema.  2x2 cm area of exposed cervical esophageal fusion plate just behind cricopharyngeus. 20-30 mL frank pus released. No cancer identified.   DICTATION:  The patient was transferred from her intensive care unit bed to the operating table.  The patient in a comfortable supine position, general orotracheal anesthesia was induced per indwelling OT tube.  A surgical timeout was achieved.  The neck was very kyphotic but was well supported. Moist 4 x 4 was used to protect the upper alveolus. Taking care to protect lips, teeth, and endotracheal tube, the anterior commissure laryngoscope was introduced. Careful inspection of the supraglottic larynx revealed edema but no tumor. There was frank pus in the pharynx. Inspecting from the pyriform sinuses to the postcricoid region, a frank perforation with a fusion plate visible possibly 2 x 2 centimeters with frank pus emanating there from was noted.  The laryngoscope was removed. An adult cervical esophagoscope was introduced. I could not pass this. The pediatric cervical esophagoscope was introduced and I could not pass this either. This was removed.  The laryngoscope was reintroduced and careful inspection once again the same findings as above. This was removed. The gum guard was removed.  Hemostasis was observed. At this point the procedure was completed. The patient was returned to the intensive care unit intubated.  PLAN OF CARE: Admit to inpatient .  She will need neck exploration per the orthopedic surgeons. Continue IV antibiotics. Continue nothing by  mouth.  PATIENT DISPOSITION:  ICU - intubated and critically ill.   Delay start of Pharmacological VTE agent (>24hrs) due to surgical blood loss or risk of bleeding: no

## 2016-10-17 NOTE — Anesthesia Postprocedure Evaluation (Signed)
Anesthesia Post Note  Patient: Mandy Rhodes  Procedure(s) Performed: Procedure(s) (LRB): DIRECT LARYNGOSCOPY,  ESOPHAGOSCOPY (N/A) TRACHEOSTOMY (N/A)  Patient location during evaluation: ICU Anesthesia Type: General Level of consciousness: sedated Pain management: pain level controlled Vital Signs Assessment: post-procedure vital signs reviewed and stable Respiratory status: respiratory function stable and patient on ventilator - see flowsheet for VS Cardiovascular status: stable Anesthetic complications: no    Last Vitals:  Vitals:   10/17/16 1045 10/17/16 1200  BP:    Pulse: 64   Resp: 14   Temp:  (!) 35.4 C    Last Pain:  Vitals:   10/17/16 1200  TempSrc: Axillary  PainSc:                  Jocelin Schuelke RYAN

## 2016-10-17 NOTE — Interval H&P Note (Signed)
History and Physical Interval Note:  10/17/2016 7:40 AM  Mandy Rhodes  has presented today for surgery, with the diagnosis of laryngeal mass  The various methods of treatment have been discussed with the patient and family. After consideration of risks, benefits and other options for treatment, the patient has consented to  Procedure(s): DIRECT LARYNGOSCOPY WITH BIOPSY POSSIBLE BRONCHOSCOPY POSSIBLE ESOPHAGOSCOPY (N/A) TRACHEOSTOMY (N/A) as a surgical intervention .  The patient's history has been re-reviewed, patient re-examined, no change in status, stable for surgery.  I have re-reviewed the patient's chart and labs.  Questions were answered to the patient's satisfaction.     [name]

## 2016-10-17 NOTE — Progress Notes (Signed)
Pharmacy Antibiotic Note  Mandy Rhodes is a 75 y.o. female PMH significant for CKD (baseline Scr ~ 1.8) and recent Z-pak completed in past 7 days as outpatient for PNA/bronchitis, admitted on 10/12/2016 with SOB,weakness, weight loss.  On admit she was found to have respiratory failure currently intubated, elevated troponin being worked-up for ACS.  CXR also + LLL   pneumonia.  Unasyn was started for PNA.   Patient also has noted laryngeal mass --> s/p esophagoscopy on 11/28 with pus noted in the pharynx.  To add vancomycin on 11/28 for cervical esophageal perforation.  Today, 10/17/2016:  Day #5 unasyn  Afebrile, WBC wnl  CKD, scr trending down 1.94 (crcl~28) on 11/26    Plan: - continue Unasyn 3gm IV q12h for CrCl<72ml/min - vancomycin 1000 mg IV q24h  ___________________________   Height: 5\' 6"  (167.6 cm) Weight: 193 lb 5.5 oz (87.7 kg) IBW/kg (Calculated) : 59.3  Temp (24hrs), Avg:97.5 F (36.4 C), Min:97 F (36.1 C), Max:97.9 F (36.6 C)   Recent Labs Lab 10/13/16 0310 10/13/16 0534 10/13/16 2108 10/13/16 2109 10/14/16 0404 10/15/16 0645 10/16/16 0332 10/17/16 0445  WBC  --  10.2 7.6  --  6.2 6.4 6.0 5.2  CREATININE  --  1.85* 1.97*  --  2.18* 2.22* 2.12* 1.94*  LATICACIDVEN 2.4* 1.8  --  1.4  --   --   --   --     Estimated Creatinine Clearance: 28 mL/min (by C-G formula based on SCr of 1.94 mg/dL (H)).    Allergies  Allergen Reactions  . Sulfa Antibiotics Swelling    Antimicrobials this admission:  11/24 Unasyn >> 11/28 vanc>>  Dose adjustments this admission:  11/24: Dec to q12h for CrCl <19ml/min  Microbiology results:  11/24 Blood Cx x2: 11/24 Urine Legionella Ag: neg 11/24 Urine Streptococcal Ag: neg 11/24 Respiratory Viral Panel PCR (Tracheal): neg 11/24 Tracheal Aspirate Cx: few GPC, GNR 11/24 MRSA PCR:  Negative  Thank you for allowing pharmacy to be a part of this patient's care.  Dia Sitter, PharmD, BCPS 10/17/2016 9:34 AM

## 2016-10-18 ENCOUNTER — Ambulatory Visit: Payer: Medicare Other | Admitting: Internal Medicine

## 2016-10-18 ENCOUNTER — Inpatient Hospital Stay (HOSPITAL_COMMUNITY): Payer: Medicare Other | Admitting: Anesthesiology

## 2016-10-18 ENCOUNTER — Inpatient Hospital Stay (HOSPITAL_COMMUNITY): Payer: Medicare Other

## 2016-10-18 ENCOUNTER — Encounter: Payer: Self-pay | Admitting: *Deleted

## 2016-10-18 ENCOUNTER — Encounter (HOSPITAL_COMMUNITY)
Admission: EM | Disposition: A | Discharge status: Skilled Nursing Facility | Payer: Self-pay | Source: Home / Self Care | Attending: Pulmonary Disease

## 2016-10-18 DIAGNOSIS — Z882 Allergy status to sulfonamides status: Secondary | ICD-10-CM

## 2016-10-18 DIAGNOSIS — A419 Sepsis, unspecified organism: Secondary | ICD-10-CM

## 2016-10-18 DIAGNOSIS — Z87891 Personal history of nicotine dependence: Secondary | ICD-10-CM

## 2016-10-18 DIAGNOSIS — M4622 Osteomyelitis of vertebra, cervical region: Secondary | ICD-10-CM

## 2016-10-18 DIAGNOSIS — J391 Other abscess of pharynx: Secondary | ICD-10-CM

## 2016-10-18 DIAGNOSIS — Y792 Prosthetic and other implants, materials and accessory orthopedic devices associated with adverse incidents: Secondary | ICD-10-CM

## 2016-10-18 DIAGNOSIS — Z981 Arthrodesis status: Secondary | ICD-10-CM

## 2016-10-18 DIAGNOSIS — Z9911 Dependence on respirator [ventilator] status: Secondary | ICD-10-CM

## 2016-10-18 DIAGNOSIS — T8463XA Infection and inflammatory reaction due to internal fixation device of spine, initial encounter: Principal | ICD-10-CM

## 2016-10-18 DIAGNOSIS — Z8249 Family history of ischemic heart disease and other diseases of the circulatory system: Secondary | ICD-10-CM

## 2016-10-18 DIAGNOSIS — Z823 Family history of stroke: Secondary | ICD-10-CM

## 2016-10-18 DIAGNOSIS — Z833 Family history of diabetes mellitus: Secondary | ICD-10-CM

## 2016-10-18 DIAGNOSIS — Z8041 Family history of malignant neoplasm of ovary: Secondary | ICD-10-CM

## 2016-10-18 DIAGNOSIS — Z9889 Other specified postprocedural states: Secondary | ICD-10-CM

## 2016-10-18 DIAGNOSIS — G9340 Encephalopathy, unspecified: Secondary | ICD-10-CM

## 2016-10-18 HISTORY — PX: ANTERIOR CERVICAL DECOMP/DISCECTOMY FUSION: SHX1161

## 2016-10-18 LAB — GLUCOSE, CAPILLARY
GLUCOSE-CAPILLARY: 138 mg/dL — AB (ref 65–99)
GLUCOSE-CAPILLARY: 116 mg/dL — AB (ref 65–99)
Glucose-Capillary: 127 mg/dL — ABNORMAL HIGH (ref 65–99)
Glucose-Capillary: 133 mg/dL — ABNORMAL HIGH (ref 65–99)
Glucose-Capillary: 135 mg/dL — ABNORMAL HIGH (ref 65–99)
Glucose-Capillary: 139 mg/dL — ABNORMAL HIGH (ref 65–99)
Glucose-Capillary: 151 mg/dL — ABNORMAL HIGH (ref 65–99)

## 2016-10-18 LAB — RENAL FUNCTION PANEL
Albumin: 2.1 g/dL — ABNORMAL LOW (ref 3.5–5.0)
Anion gap: 9 (ref 5–15)
BUN: 43 mg/dL — ABNORMAL HIGH (ref 6–20)
CALCIUM: 9.2 mg/dL (ref 8.9–10.3)
CO2: 24 mmol/L (ref 22–32)
CREATININE: 1.97 mg/dL — AB (ref 0.44–1.00)
Chloride: 117 mmol/L — ABNORMAL HIGH (ref 101–111)
GFR calc Af Amer: 27 mL/min — ABNORMAL LOW (ref >60)
GFR calc non Af Amer: 24 mL/min — ABNORMAL LOW (ref >60)
GLUCOSE: 140 mg/dL — AB (ref 65–99)
Phosphorus: 3.4 mg/dL (ref 2.5–4.6)
Potassium: 3.6 mmol/L (ref 3.5–5.1)
SODIUM: 150 mmol/L — AB (ref 135–145)

## 2016-10-18 LAB — CULTURE, BLOOD (ROUTINE X 2)
Culture: NO GROWTH
Culture: NO GROWTH

## 2016-10-18 LAB — BASIC METABOLIC PANEL
ANION GAP: 6 (ref 5–15)
BUN: 36 mg/dL — ABNORMAL HIGH (ref 6–20)
CO2: 24 mmol/L (ref 22–32)
Calcium: 8.8 mg/dL — ABNORMAL LOW (ref 8.9–10.3)
Chloride: 118 mmol/L — ABNORMAL HIGH (ref 101–111)
Creatinine, Ser: 1.88 mg/dL — ABNORMAL HIGH (ref 0.44–1.00)
GFR calc Af Amer: 29 mL/min — ABNORMAL LOW (ref >60)
GFR calc non Af Amer: 25 mL/min — ABNORMAL LOW (ref >60)
GLUCOSE: 121 mg/dL — AB (ref 65–99)
POTASSIUM: 3.3 mmol/L — AB (ref 3.5–5.1)
Sodium: 148 mmol/L — ABNORMAL HIGH (ref 135–145)

## 2016-10-18 SURGERY — ANTERIOR CERVICAL DECOMPRESSION/DISCECTOMY FUSION 1 LEVEL/HARDWARE REMOVAL
Anesthesia: General | Site: Neck

## 2016-10-18 MED ORDER — SODIUM CHLORIDE 0.9 % IJ SOLN
INTRAMUSCULAR | Status: AC
  Filled 2016-10-18: qty 10

## 2016-10-18 MED ORDER — ONDANSETRON HCL 4 MG/2ML IJ SOLN
INTRAMUSCULAR | Status: AC
  Filled 2016-10-18: qty 2

## 2016-10-18 MED ORDER — SUFENTANIL CITRATE 50 MCG/ML IV SOLN
INTRAVENOUS | Status: AC
  Filled 2016-10-18: qty 1

## 2016-10-18 MED ORDER — LACTATED RINGERS IV SOLN
INTRAVENOUS | Status: DC | PRN
  Administered 2016-10-18: 19:00:00 via INTRAVENOUS

## 2016-10-18 MED ORDER — MIDAZOLAM HCL 2 MG/2ML IJ SOLN
INTRAMUSCULAR | Status: AC
  Filled 2016-10-18: qty 2

## 2016-10-18 MED ORDER — PHENYLEPHRINE HCL 10 MG/ML IJ SOLN
INTRAMUSCULAR | Status: AC
  Filled 2016-10-18: qty 1

## 2016-10-18 MED ORDER — PHENYLEPHRINE HCL 10 MG/ML IJ SOLN
INTRAVENOUS | Status: DC | PRN
  Administered 2016-10-18: 10 ug/min via INTRAVENOUS

## 2016-10-18 MED ORDER — SUFENTANIL CITRATE 50 MCG/ML IV SOLN
INTRAVENOUS | Status: DC | PRN
  Administered 2016-10-18 (×2): 10 ug via INTRAVENOUS
  Administered 2016-10-18: 5 ug via INTRAVENOUS
  Administered 2016-10-18 (×2): 10 ug via INTRAVENOUS
  Administered 2016-10-18: 5 ug via INTRAVENOUS

## 2016-10-18 MED ORDER — SODIUM CHLORIDE 0.9 % IV SOLN
30.0000 meq | Freq: Once | INTRAVENOUS | Status: AC
  Administered 2016-10-18: 30 meq via INTRAVENOUS
  Filled 2016-10-18: qty 15

## 2016-10-18 MED ORDER — MIDAZOLAM HCL 5 MG/5ML IJ SOLN
INTRAMUSCULAR | Status: DC | PRN
  Administered 2016-10-18 (×2): 2 mg via INTRAVENOUS

## 2016-10-18 MED ORDER — THROMBIN 5000 UNITS EX SOLR
CUTANEOUS | Status: AC
  Filled 2016-10-18: qty 10000

## 2016-10-18 MED ORDER — HEMOSTATIC AGENTS (NO CHARGE) OPTIME
TOPICAL | Status: DC | PRN
  Administered 2016-10-18: 1 via TOPICAL

## 2016-10-18 MED ORDER — ROCURONIUM BROMIDE 100 MG/10ML IV SOLN
INTRAVENOUS | Status: DC | PRN
  Administered 2016-10-18 (×2): 50 mg via INTRAVENOUS

## 2016-10-18 MED ORDER — LACTATED RINGERS IV SOLN: INTRAVENOUS | Status: DC

## 2016-10-18 MED ORDER — THROMBIN 20000 UNITS EX SOLR
CUTANEOUS | Status: AC
  Filled 2016-10-18: qty 20000

## 2016-10-18 MED ORDER — FAMOTIDINE IN NACL 20-0.9 MG/50ML-% IV SOLN
20.0000 mg | Freq: Every day | INTRAVENOUS | Status: DC
  Administered 2016-10-18 – 2016-10-19 (×3): 20 mg via INTRAVENOUS
  Filled 2016-10-18 (×3): qty 50

## 2016-10-18 MED ORDER — 0.9 % SODIUM CHLORIDE (POUR BTL) OPTIME
TOPICAL | Status: DC | PRN
  Administered 2016-10-18: 1000 mL

## 2016-10-18 MED ORDER — PHENYLEPHRINE 40 MCG/ML (10ML) SYRINGE FOR IV PUSH (FOR BLOOD PRESSURE SUPPORT)
PREFILLED_SYRINGE | INTRAVENOUS | Status: AC
  Filled 2016-10-18: qty 10

## 2016-10-18 MED ORDER — THROMBIN 5000 UNITS EX SOLR
CUTANEOUS | Status: DC | PRN
  Administered 2016-10-18 (×2): 5000 [IU] via TOPICAL

## 2016-10-18 MED ORDER — FENTANYL CITRATE (PF) 100 MCG/2ML IJ SOLN
INTRAMUSCULAR | Status: AC
  Filled 2016-10-18: qty 4

## 2016-10-18 MED ORDER — PHENYLEPHRINE HCL 10 MG/ML IJ SOLN
INTRAMUSCULAR | Status: DC | PRN
  Administered 2016-10-18 (×3): 80 ug via INTRAVENOUS

## 2016-10-18 MED ORDER — THROMBIN 5000 UNITS EX SOLR
CUTANEOUS | Status: AC
  Filled 2016-10-18: qty 5000

## 2016-10-18 MED ORDER — HEPARIN SODIUM (PORCINE) 5000 UNIT/ML IJ SOLN
5000.0000 [IU] | Freq: Three times a day (TID) | INTRAMUSCULAR | Status: DC
  Administered 2016-10-18 – 2016-10-23 (×15): 5000 [IU] via SUBCUTANEOUS
  Filled 2016-10-18 (×15): qty 1

## 2016-10-18 MED ORDER — FENTANYL CITRATE (PF) 100 MCG/2ML IJ SOLN
INTRAMUSCULAR | Status: DC | PRN
  Administered 2016-10-18 (×2): 100 ug via INTRAVENOUS

## 2016-10-18 MED ORDER — ROCURONIUM BROMIDE 10 MG/ML (PF) SYRINGE
PREFILLED_SYRINGE | INTRAVENOUS | Status: AC
  Filled 2016-10-18: qty 10

## 2016-10-18 MED ORDER — SODIUM CHLORIDE 0.9 % IR SOLN
Status: DC | PRN
  Administered 2016-10-18: 500 mL

## 2016-10-18 MED ORDER — PROPOFOL 10 MG/ML IV BOLUS
INTRAVENOUS | Status: AC
  Filled 2016-10-18: qty 20

## 2016-10-18 SURGICAL SUPPLY — 55 items
BAG DECANTER FOR FLEXI CONT (MISCELLANEOUS) ×2 IMPLANT
BENZOIN TINCTURE PRP APPL 2/3 (GAUZE/BANDAGES/DRESSINGS) ×2 IMPLANT
BIT DRILL NEURO 2X3.1 SFT TUCH (MISCELLANEOUS) IMPLANT
BUR MATCHSTICK NEURO 3.0 LAGG (BURR) ×2 IMPLANT
CANISTER SUCT 3000ML PPV (MISCELLANEOUS) ×2 IMPLANT
CARTRIDGE OIL MAESTRO DRILL (MISCELLANEOUS) IMPLANT
CORDS BIPOLAR (ELECTRODE) ×2 IMPLANT
DERMABOND ADVANCED (GAUZE/BANDAGES/DRESSINGS) ×1
DERMABOND ADVANCED .7 DNX12 (GAUZE/BANDAGES/DRESSINGS) ×1 IMPLANT
DIFFUSER DRILL AIR PNEUMATIC (MISCELLANEOUS) ×2 IMPLANT
DRAPE C-ARM 42X72 X-RAY (DRAPES) ×4 IMPLANT
DRAPE LAPAROTOMY 100X72 PEDS (DRAPES) ×2 IMPLANT
DRAPE MICROSCOPE LEICA (MISCELLANEOUS) ×2 IMPLANT
DRAPE POUCH INSTRU U-SHP 10X18 (DRAPES) ×2 IMPLANT
DRILL NEURO 2X3.1 SOFT TOUCH (MISCELLANEOUS)
DURAPREP 6ML APPLICATOR 50/CS (WOUND CARE) ×2 IMPLANT
ELECT COATED BLADE 2.86 ST (ELECTRODE) ×2 IMPLANT
ELECT REM PT RETURN 9FT ADLT (ELECTROSURGICAL) ×2
ELECTRODE REM PT RTRN 9FT ADLT (ELECTROSURGICAL) ×1 IMPLANT
GAUZE SPONGE 4X4 12PLY STRL (GAUZE/BANDAGES/DRESSINGS) ×2 IMPLANT
GAUZE SPONGE 4X4 16PLY XRAY LF (GAUZE/BANDAGES/DRESSINGS) IMPLANT
GLOVE BIO SURGEON STRL SZ 6.5 (GLOVE) ×6 IMPLANT
GLOVE BIO SURGEON STRL SZ7 (GLOVE) ×4 IMPLANT
GLOVE BIOGEL PI IND STRL 6.5 (GLOVE) ×1 IMPLANT
GLOVE BIOGEL PI IND STRL 7.0 (GLOVE) ×1 IMPLANT
GLOVE BIOGEL PI INDICATOR 6.5 (GLOVE) ×1
GLOVE BIOGEL PI INDICATOR 7.0 (GLOVE) ×1
GLOVE ECLIPSE 9.0 STRL (GLOVE) ×2 IMPLANT
GLOVE EXAM NITRILE LRG STRL (GLOVE) IMPLANT
GLOVE EXAM NITRILE XL STR (GLOVE) IMPLANT
GLOVE EXAM NITRILE XS STR PU (GLOVE) IMPLANT
GOWN STRL REUS W/ TWL LRG LVL3 (GOWN DISPOSABLE) ×2 IMPLANT
GOWN STRL REUS W/ TWL XL LVL3 (GOWN DISPOSABLE) ×1 IMPLANT
GOWN STRL REUS W/TWL 2XL LVL3 (GOWN DISPOSABLE) IMPLANT
GOWN STRL REUS W/TWL LRG LVL3 (GOWN DISPOSABLE) ×2
GOWN STRL REUS W/TWL XL LVL3 (GOWN DISPOSABLE) ×1
HALTER HD/CHIN CERV TRACTION D (MISCELLANEOUS) ×2 IMPLANT
KIT BASIN OR (CUSTOM PROCEDURE TRAY) ×2 IMPLANT
KIT ROOM TURNOVER OR (KITS) ×2 IMPLANT
NEEDLE SPNL 20GX3.5 QUINCKE YW (NEEDLE) ×2 IMPLANT
NS IRRIG 1000ML POUR BTL (IV SOLUTION) ×2 IMPLANT
OIL CARTRIDGE MAESTRO DRILL (MISCELLANEOUS)
PACK LAMINECTOMY NEURO (CUSTOM PROCEDURE TRAY) ×2 IMPLANT
PAD ARMBOARD 7.5X6 YLW CONV (MISCELLANEOUS) ×6 IMPLANT
RUBBERBAND STERILE (MISCELLANEOUS) ×4 IMPLANT
SPONGE INTESTINAL PEANUT (DISPOSABLE) ×4 IMPLANT
SPONGE SURGIFOAM ABS GEL SZ50 (HEMOSTASIS) ×2 IMPLANT
STRIP CLOSURE SKIN 1/2X4 (GAUZE/BANDAGES/DRESSINGS) ×2 IMPLANT
SUT VIC AB 3-0 SH 8-18 (SUTURE) ×2 IMPLANT
SUT VIC AB 4-0 RB1 18 (SUTURE) ×2 IMPLANT
TAPE CLOTH 4X10 WHT NS (GAUZE/BANDAGES/DRESSINGS) ×2 IMPLANT
TOWEL OR 17X24 6PK STRL BLUE (TOWEL DISPOSABLE) ×2 IMPLANT
TOWEL OR 17X26 10 PK STRL BLUE (TOWEL DISPOSABLE) ×2 IMPLANT
TRAP SPECIMEN MUCOUS 40CC (MISCELLANEOUS) ×2 IMPLANT
WATER STERILE IRR 1000ML POUR (IV SOLUTION) ×2 IMPLANT

## 2016-10-18 NOTE — Brief Op Note (Signed)
10/12/2016 - 10/18/2016  8:53 PM  PATIENT:  Mandy Rhodes  75 y.o. female  PRE-OPERATIVE DIAGNOSIS:  esophageal perforation  POST-OPERATIVE DIAGNOSIS:  esophageal perforation  PROCEDURE:  Procedure(s): debridement and drainage cervical prevertebral abscess  ANTERIOR CERVICAL HARDWARE REMOVAL exploration cervical fusion (N/A)  SURGEON:  Surgeon(s) and Role:    Earnie Larsson, MD - Primary  PHYSICIAN ASSISTANT:   ASSISTANTS: none   ANESTHESIA:   general  EBL:  Total I/O In: 750 [I.V.:750] Out: 250 [Urine:50; Blood:200]  BLOOD ADMINISTERED:none  DRAINS: (10 mm) Jackson-Pratt drain(s) with closed bulb suction in the Prevertebral space   LOCAL MEDICATIONS USED:  NONE  SPECIMEN:  No Specimen  DISPOSITION OF SPECIMEN:  N/A  COUNTS:  YES  TOURNIQUET:  * No tourniquets in log *  DICTATION: .Dragon Dictation  PLAN OF CARE: Admit to inpatient   PATIENT DISPOSITION:  ICU - intubated and critically ill.   Delay start of Pharmacological VTE agent (>24hrs) due to surgical blood loss or risk of bleeding: yes

## 2016-10-18 NOTE — Progress Notes (Addendum)
PULMONARY / CRITICAL CARE MEDICINE   Name: ANJALEE NETTO MRN: [id number] DOB: January 22, 1941    ADMISSION DATE:  10/12/2016 CONSULTATION DATE:  10/13/2016  REFERRING MD:  Dr. Roxanne Mins EDP  CHIEF COMPLAINT:  Shortness of breath  HISTORY OF PRESENT ILLNESS:  75 y.o. female with past medical history as below, which is significant for diabetes, CHF, chronic kidney disease, and hypertension. She presented to [facility] emergency department 10/12/2016 with complaints of shortness of breath and generalized weakness for about 2 weeks. She has additionally suffered a 30 pound weight loss over some recent period of time which is not entirely clear. Recently treated with antibiotics as an outpatient for suspected bronchitis vs pneumonia.Upon arrival to the emergency department she was on 4 L of supplemental O2 via nasal cannula and still had increased work of breathing.due to her somnolence ABG was ordered which showed  Hypercapnic respiratory failure and she was started on BiPAP. She was known to be a DO NOT RESUSCITATE, however,  Family has decided to proceed with intubation, which was done in the emergency department. During intubation a supraglottic mass was noted by emergency room provider which made intubation difficult. Laboratory evaluation significant forserum creatinine 1.8, calcium 10.5, troponin 0.53, WBC 12, glucose 137.PCCM asked to admit  SUBJECTIVE: Remains on vent  VITAL SIGNS: BP (!) 98/41   Pulse 76   Temp 97.2 F (36.2 C) (Axillary)   Resp 12   Ht 5\' 6"  (1.676 m)   Wt 87.1 kg (192 lb 0.3 oz)   SpO2 100%   BMI 30.99 kg/m   HEMODYNAMICS:    VENTILATOR SETTINGS: Vent Mode: PSV;CPAP FiO2 (%):  [30 %-50 %] 30 % Set Rate:  [14 bmp] 14 bmp Vt Set:  [470 mL] 470 mL PEEP:  [4 cmH20-5 cmH20] 5 cmH20 Pressure Support:  [12 cmH20] 12 cmH20 Plateau Pressure:  [19 cmH20-21 cmH20] 20 cmH20  INTAKE / OUTPUT: I/O last 3 completed shifts: In: 2109 [I.V.:1559; IV  Piggyback:550] Out: 750 [Urine:750]  PHYSICAL EXAMINATION: General:  Eyes open. Multiple family members at bedside. No distress Neuro:  Following commands. Grossly nonfocal. Moving all extremities equally. HEENT:  Endotracheal tube in place. No scleral icterus or injection. Cardiovascular:  Regular rate and rhythm. No edema. No appreciable JVD. Pulmonary: clear Abdomen:  Soft.NT no r/g Musculoskeletal:  No joint effusion or deformity appreciated. Skin:  Warm and dry. no rash on exposed skin.  LABS:  BMET  Recent Labs Lab 10/16/16 0332 10/17/16 0445 10/18/16 0500  NA 149* 152* 150*  K 3.6 3.6 3.6  CL 117* 120* 117*  CO2 26 24 24   BUN 60* 54* 43*  CREATININE 2.12* 1.94* 1.97*  GLUCOSE 198* 114* 140*    Electrolytes  Recent Labs Lab 10/15/16 0645 10/16/16 0332 10/17/16 0445 10/18/16 0500  CALCIUM 9.2 9.2 9.3 9.2  MG 2.4 2.4 2.2  --   PHOS 3.2 2.4* 2.6 3.4    CBC  Recent Labs Lab 10/15/16 0645 10/16/16 0332 10/17/16 0445  WBC 6.4 6.0 5.2  HGB 8.8* 8.7* 8.7*  HCT 26.5* 26.6* 26.0*  PLT 127* 127* 108*    Coag's  Recent Labs Lab 10/13/16 0058  APTT 32  INR 1.22    Sepsis Markers  Recent Labs Lab 10/13/16 0310 10/13/16 0534 10/13/16 2109 10/14/16 0404 10/15/16 0645  LATICACIDVEN 2.4* 1.8 1.4  --   --   PROCALCITON <0.10  --   --  <0.10 <0.10    ABG  Recent Labs Lab 10/13/16 0202  10/13/16 0443 10/16/16 1205  PHART 7.539* 7.509* 7.429  PCO2ART 29.8* 33.5 40.4  PO2ART 51.1* 53.4* 108    Liver Enzymes  Recent Labs Lab 10/12/16 2302  10/16/16 0332 10/17/16 0445 10/18/16 0500  AST 33  --   --   --   --   ALT 30  --   --   --   --   ALKPHOS 97  --   --   --   --   BILITOT 1.0  --   --   --   --   ALBUMIN 3.3*  < > 2.3* 2.3* 2.1*  < > = values in this interval not displayed.  Cardiac Enzymes  Recent Labs Lab 10/13/16 0310 10/13/16 1033 10/13/16 1716  TROPONINI 0.64* 0.87* 1.09*    Glucose  Recent Labs Lab  10/17/16 1206 10/17/16 1509 10/17/16 1939 10/18/16 0041 10/18/16 0403 10/18/16 0810  GLUCAP 140* 127* 108* 135* 138* 133*    Imaging No results found.  STUDIES:  CT CHEST/NECK/SOFT TISSUE W/O 11/24: IMPRESSION: 1. Fluid and debris throughout the oropharynx, hypopharynx, and supraglottic airway from intubation. Suboptimal evaluation of the mucosa due to airway opacification and lack of contrast. Follow-up contrast examination after extubation and/or direct visualization recommended for evaluation of the supraglottic mass. 2. No cervical lymphadenopathy identified. 3. Left lower lobe consolidation and nodular opacities throughout the lungs compatible with pneumonia. Small left pleural effusion. 4. Endotracheal tube 1 cm from carina, retraction recommended. 5. Enteric tube tip below the field of view in the stomach. 6. Moderate coronary artery calcifications. 7. Borderline main pulmonary artery may represent pulmonary artery hypertension. CT HEAD W/O 11/24:  No acute intracranial abnormality. Moderate brain parenchymal atrophy and chronic microvascular disease. TTE 11/24: LV normal in size with EF 30-35%. Moderate concentric LVH. Appearance consistent with infiltrative cardiomyopathy. Moderate diffuse hypokinesis without regional variation. Grade 2 diastolic dysfunction. LA moderately dilated & RA normal in size. RV normal in size and function. No aortic stenosis or regurgitation. Aortic root normal in size. Trivial mitral regurgitation without stenosis. No pulmonic regurgitation. Mild tricuspid regurgitation. No pericardial effusion. 11/28: Direct laryngoscopy, esophagoscopy: Laryngeal and pharyngeal edema.  2x2 cm area of exposed cervical esophageal fusion plate just behind cricopharyngeus. 20-30 mL frank pus released. No cancer identified  MICROBIOLOGY: MRSA PCR 11/24:  Negative Blood Ctx x2 11/24 >>  Urine Legionella Ag 11/24 >> Urine Streptococcal Ag 11/24:  Negative Respiratory  Viral Panel PCR 11/24 (Tracheal):  Negative Tracheal Aspirate Ctx 11/24 >>  ANTIBIOTICS: Unasyn 11/24 >>  vanc 11/28>>>  SIGNIFICANT EVENTS: 11/24 - intubated, admitted to ICU. 11/28 Now back from surgery, C-plate perforated thru esophagus  LINES/TUBES: OETT 7.5 11/23 >> OGT 11/23 >>out FOLEY 11/23 >> PIV  ASSESSMENT / PLAN:  INFECTIOUS A:   Possible Severe Community Acquired Pneumonia Left Lower Lobe (NPF) Infected cervicle hardware likley oseto P:   Empiric Unasyn Day #4; adding Vanc given cervical fusion plate exposure  To OR for hardware removal No mediastinal involbement, no antifungals Will discuss with NS, can we get bone BX after plate removed ID consult, osteo? Empiric course? MRI in future?  PULMONARY A: Acute Hypercarbic Respiratory Failure Aspiration PNA  H/O Tobacco Use - Cigarettes & chewing tobacco  P:   To OR, no extubation Wean today okay for exercise, cpap 5 ps5, goal 2 hours pcxr in am for LLL Xopenex & Atrovent Nebs q6hr Scheduled  CARDIOVASCULAR A:  NSTEMI/Elevated Troponin I - S/P 300mg  Rectal ASA. SVT - Seen since admission.  Borderline Hypotension - Likely due to hypovolemia.  Probable Infiltrative Cardiomyopathy - Seen on TTE. Question amyloidosis.  H/O CHF H/O HTN H/o Hyperlipidemia  P:  Continuous Telemetry Monitoring Vitals per Unit Protocol ASA 81mg  VT daily (on hold for now) - to OR Holding  home captopril, pravastatin Holding home Lasix  RENAL A:   Acute Renal Failure - now improving  Lactic Acidosis - Resolved. Mild. Hypokalemia - Resolved. Hypernatremia  Worse  Hyperchloremia - Stable. H/O CKD Stage   P:   Am chem  D5W @ 40ml/hr- increase rates slight  GASTROINTESTINAL A:   Moderate Protein-Calorie Malnutrition H/O GERD  P:   NPO Start H2B IV again as NPO Leave OGT out per n-surg request  Will need PEG likely  HEMATOLOGIC/ONCOLOGIC A:   Anemia - No signs of active bleeding. Chronic w/ Hgb  10.4-11.1. Supraglottic Mass - Not appreciated w/ noncontrast neck CT.  P:  Trending Cell Counts daily w/ CBC / plat scd  ENDOCRINE A:   H/O Diabetes Mellitus Type 2 - A1c 7.2.  P:   SSI per Sensitive Algorithm Accu-Checks q4hr  NEUROLOGIC A:   Acute Encephalopathy - Likely secondary to hypercarbia vs toxic metabolic. Sedation on Ventilator H/O Neuropathy - Secondary to diabetes.  P:   RASS goal: 0 to -1 Versed IV prn Sedation Fentanyl IV prn Pain/Discomfort. Holding home Nortriptyline , no start in ICU  FAMILY  - Updates: Family updated by me, son  - Inter-disciplinary family meet or Palliative Care meeting due by:  11/30   Ccm time 30 min   Lavon Paganini. Titus Mould, MD, [facility] Pgr: [facility]

## 2016-10-18 NOTE — Anesthesia Preprocedure Evaluation (Signed)
Anesthesia Evaluation  Patient identified by MRN, date of birth, ID band Patient awake    Reviewed: Allergy & Precautions, NPO status , Patient's Chart, lab work & pertinent test results  Airway Mallampati: I  TM Distance: >3 FB Neck ROM: Full    Dental   Pulmonary former smoker,    Pulmonary exam normal        Cardiovascular hypertension, Normal cardiovascular exam     Neuro/Psych    GI/Hepatic GERD  Medicated and Controlled,  Endo/Other  diabetes  Renal/GU Renal InsufficiencyRenal disease     Musculoskeletal   Abdominal   Peds  Hematology   Anesthesia Other Findings   Reproductive/Obstetrics                             Anesthesia Physical Anesthesia Plan  ASA: III  Anesthesia Plan: General   Post-op Pain Management:    Induction: Intravenous  Airway Management Planned: Oral ETT  Additional Equipment:   Intra-op Plan:   Post-operative Plan: Post-operative intubation/ventilation  Informed Consent: I have reviewed the patients History and Physical, chart, labs and discussed the procedure including the risks, benefits and alternatives for the proposed anesthesia with the patient or authorized representative who has indicated his/her understanding and acceptance.     Plan Discussed with: CRNA and Surgeon  Anesthesia Plan Comments:         Anesthesia Quick Evaluation

## 2016-10-18 NOTE — Anesthesia Postprocedure Evaluation (Signed)
Anesthesia Post Note  Patient: Mandy Rhodes  Procedure(s) Performed: Procedure(s) (LRB): debridement and drainage cervical prevertebral abscess  ANTERIOR CERVICAL HARDWARE REMOVAL exploration cervical fusion (N/A)  Patient location during evaluation: SICU Anesthesia Type: General Level of consciousness: sedated Pain management: pain level controlled Vital Signs Assessment: post-procedure vital signs reviewed and stable Respiratory status: patient remains intubated per anesthesia plan Cardiovascular status: stable Anesthetic complications: no    Last Vitals:  Vitals:   10/18/16 1700 10/18/16 1800  BP: 98/61 (!) 93/51  Pulse: 71 64  Resp: (!) 0 (!) 0  Temp:      Last Pain:  Vitals:   10/18/16 1525  TempSrc: Axillary  PainSc:                  Darleth Eustache DAVID

## 2016-10-18 NOTE — Progress Notes (Signed)
Nutrition Follow-up  DOCUMENTATION CODES:   Non-severe (moderate) malnutrition in context of acute illness/injury  INTERVENTION:   Once enteral access obtained recommend: Vital AF 1.2 @ 40 ml/hr (960 ml/day) 30 ml Prostat TID Provides: 1452 kcal, 117 grams protein, and 778 ml H2O.    NUTRITION DIAGNOSIS:   Inadequate oral intake related to poor appetite, inability to eat as evidenced by per patient/family report, NPO status. Ongoing.   GOAL:   Patient will meet greater than or equal to 90% of their needs   MONITOR:   Skin, I & O's, Vent status (ability to restart TF)  REASON FOR ASSESSMENT:   Malnutrition Screening Tool, Ventilator    ASSESSMENT:   Pt with PMH for DM, CHF, CKD, and HTN. Pt admitted with SOB x 2 weeks and 30 pound weight loss. Recently treated with antibiotics as an outpatient for suspected bronchitis vs pneumonia. MD addressing severe CAP, esophageal perforation with exposed hardware, infected cervical hardware, possible osteomyelitis  Pt discussed during ICU rounds and with RN.  Plan for surgical exploration of her neck with removal of hardware today. Unsure at this time what MD plans for feeding access, may need PEG per CCM. Currently no enteral access, TF stopped and OG removed 11/27  Patient is currently intubated on ventilator support MV: 7 L/min Temp (24hrs), Avg:97.2 F (36.2 C), Min:96.1 F (35.6 C), Max:98.5 F (36.9 C) MAP 67  Medications reviewed and include: 3% saline, D5 @ 100 Labs reviewed: Na 150 Weight on 11/24 was 179 lb which correlates with BMI of 29 (will use this weight for calculations)  Diet Order:    NPO  Skin:  Wound (see comment) (stage II pressure injury on buttocks )  Last BM:  11/28  Height:   Ht Readings from Last 1 Encounters:  10/18/16 5\' 6"  (1.676 m)    Weight:   Wt Readings from Last 1 Encounters:  10/18/16 192 lb 0.3 oz (87.1 kg)    Ideal Body Weight:  59.09 kg  BMI:  Body mass index is 30.99  kg/m.  Estimated Nutritional Needs:   Kcal:  1443  Protein:  115-130 grams  Fluid:  > 1.5 L/day  EDUCATION NEEDS:   No education needs identified at this time  [place], [place], [place] Pager [phone] After Hours Pager

## 2016-10-18 NOTE — Transfer of Care (Signed)
Immediate Anesthesia Transfer of Care Note  Patient: Mandy Rhodes  Procedure(s) Performed: Procedure(s): debridement and drainage cervical prevertebral abscess  ANTERIOR CERVICAL HARDWARE REMOVAL exploration cervical fusion (N/A)  Patient Location: SICU  Anesthesia Type:General  Level of Consciousness: Patient remains intubated per anesthesia plan  Airway & Oxygen Therapy: Patient remains intubated per anesthesia plan and Patient placed on Ventilator (see vital sign flow sheet for setting)  Post-op Assessment: Report given to RN and Post -op Vital signs reviewed and stable  Post vital signs: Reviewed and stable  Last Vitals:  Vitals:   10/18/16 1700 10/18/16 1800  BP: 98/61 (!) 93/51  Pulse: 71 64  Resp: (!) 0 (!) 0  Temp:      Last Pain:  Vitals:   10/18/16 1525  TempSrc: Axillary  PainSc:          Complications: No apparent anesthesia complications

## 2016-10-18 NOTE — Consult Note (Signed)
Reason for Consult: Cervical infection Referring Physician: Critical care  Mandy Rhodes is an 75 y.o. female.  HPI: 75 year old female status post multilevel anterior cervical decompression infusion done at outside institution in 2009. Patient recently admitted with respiratory failure. Upon intubation the patient was noted to have a supraglottic mass. Further evaluation demonstrates that the patient has eroded through her posterior wall of her upper esophagus and has cervical hardware visible.  Past Medical History:  Diagnosis Date  . Anemia   . Arthritis   . CHF (congestive heart failure) ([place])   . Chronic kidney disease    Renal Insuffiency, see [facility] once a year  . Diabetes mellitus    type 2  . GERD (gastroesophageal reflux disease)   . History of rotator cuff tear    Torn right rotator cuff.  . History of spinal stenosis   . Hypertension   . Neuropathy associated with endocrine disorder [facility])     Past Surgical History:  Procedure Laterality Date  . ABDOMINAL HYSTERECTOMY  1971   Partial  . APPENDECTOMY    . BACK SURGERY  2009   Disectomy  . CARDIAC CATHETERIZATION N/A 03/22/2016   Procedure: Left Heart Cath and Coronary Angiography;  Surgeon: Peter M Martinique, MD;  Location: [place] [facility];  Service: Cardiovascular;  Laterality: N/A;  . CHOLECYSTECTOMY  1969   Status post   . CYST REMOVAL TRUNK Right 12/03/2014   Procedure: EXCISION OF RIGHT BACK CYST ;  Surgeon: Coralie Keens, MD;  Location: [place];  Service: General;  Laterality: Right;  . DIRECT LARYNGOSCOPY N/A 10/17/2016   Procedure: DIRECT LARYNGOSCOPY,  ESOPHAGOSCOPY;  Surgeon: [name], MD;  Location: [facility];  Service: ENT;  Laterality: N/A;  . FOOT SURGERY Bilateral    bone removed  . NEUROPLASTY / TRANSPOSITION MEDIAN NERVE AT CARPAL TUNNEL Bilateral    Hx  Left carpal tunnel repair  . TRACHEOSTOMY TUBE PLACEMENT N/A 10/17/2016   Procedure: TRACHEOSTOMY;  Surgeon: [name], MD;   Location: [facility];  Service: ENT;  Laterality: N/A;    Family History  Problem Relation Age of Onset  . Diabetes type II Mother   . Hypertension Father   . Diabetes type II Sister   . Ovarian cancer Sister   . Diabetes type II Other   . Stroke Neg Hx   . CAD Neg Hx   . Heart failure Neg Hx     Social History:  reports that she has quit smoking. She has never used smokeless tobacco. She reports that she does not drink alcohol or use drugs.  Allergies:  Allergies  Allergen Reactions  . Sulfa Antibiotics Swelling    Medications: I have reviewed the patient's current medications.  Results for orders placed or performed during the hospital encounter of 10/12/16 (from the past 48 hour(s))  Glucose, capillary     Status: Abnormal   Collection Time: 10/16/16 11:50 AM  Result Value Ref Range   Glucose-Capillary 168 (H) 65 - 99 mg/dL  Blood gas, arterial     Status: Abnormal   Collection Time: 10/16/16 12:05 PM  Result Value Ref Range   FIO2 30.00    Delivery systems VENTILATOR    Mode PRESSURE REGULATED VOLUME CONTROL    VT 470 mL   LHR 14 resp/min   Peep/cpap 5.0 cm H20   pH, Arterial 7.429 7.350 - 7.450   pCO2 arterial 40.4 32.0 - 48.0 mmHg   pO2, Arterial 108 83.0 - 108.0 mmHg  Bicarbonate 26.3 20.0 - 28.0 mmol/L   Acid-Base Excess 2.3 (H) 0.0 - 2.0 mmol/L   O2 Saturation 97.5 %   Patient temperature 98.6    Collection site A-LINE    Drawn by [phone]    Sample type ARTERIAL DRAW    Allens test (pass/fail) PASS PASS  Glucose, capillary     Status: Abnormal   Collection Time: 10/16/16  4:31 PM  Result Value Ref Range   Glucose-Capillary 124 (H) 65 - 99 mg/dL  Glucose, capillary     Status: None   Collection Time: 10/16/16  7:34 PM  Result Value Ref Range   Glucose-Capillary 94 65 - 99 mg/dL   Comment 1 Notify RN   Glucose, capillary     Status: Abnormal   Collection Time: 10/16/16 11:55 PM  Result Value Ref Range   Glucose-Capillary 104 (H) 65 - 99 mg/dL   Glucose, capillary     Status: None   Collection Time: 10/17/16  3:40 AM  Result Value Ref Range   Glucose-Capillary 88 65 - 99 mg/dL   Comment 1 Notify RN   Magnesium     Status: None   Collection Time: 10/17/16  4:45 AM  Result Value Ref Range   Magnesium 2.2 1.7 - 2.4 mg/dL  Renal function panel     Status: Abnormal   Collection Time: 10/17/16  4:45 AM  Result Value Ref Range   Sodium 152 (H) 135 - 145 mmol/L   Potassium 3.6 3.5 - 5.1 mmol/L   Chloride 120 (H) 101 - 111 mmol/L   CO2 24 22 - 32 mmol/L   Glucose, Bld 114 (H) 65 - 99 mg/dL   BUN 54 (H) 6 - 20 mg/dL   Creatinine, Ser 1.94 (H) 0.44 - 1.00 mg/dL   Calcium 9.3 8.9 - 10.3 mg/dL   Phosphorus 2.6 2.5 - 4.6 mg/dL   Albumin 2.3 (L) 3.5 - 5.0 g/dL   GFR calc non Af Amer 24 (L) >60 mL/min   GFR calc Af Amer 28 (L) >60 mL/min    Comment: (NOTE) The eGFR has been calculated using the CKD EPI equation. This calculation has not been validated in all clinical situations. eGFR's persistently <60 mL/min signify possible Chronic Kidney Disease.    Anion gap 8 5 - 15  TSH     Status: None   Collection Time: 10/17/16  4:45 AM  Result Value Ref Range   TSH 1.027 0.350 - 4.500 uIU/mL    Comment: Performed by a 3rd Generation assay with a functional sensitivity of <=0.01 uIU/mL.  T4, free     Status: Abnormal   Collection Time: 10/17/16  4:45 AM  Result Value Ref Range   Free T4 1.28 (H) 0.61 - 1.12 ng/dL    Comment: (NOTE) Biotin ingestion may interfere with free T4 tests. If the results are inconsistent with the TSH level, previous test results, or the clinical presentation, then consider biotin interference. If needed, order repeat testing after stopping biotin. Performed at [facility]   CBC     Status: Abnormal   Collection Time: 10/17/16  4:45 AM  Result Value Ref Range   WBC 5.2 4.0 - 10.5 K/uL   RBC 2.99 (L) 3.87 - 5.11 MIL/uL   Hemoglobin 8.7 (L) 12.0 - 15.0 g/dL   HCT 26.0 (L) 36.0 - 46.0 %   MCV  87.0 78.0 - 100.0 fL   MCH 29.1 26.0 - 34.0 pg   MCHC 33.5 30.0 - 36.0 g/dL  RDW 16.6 (H) 11.5 - 15.5 %   Platelets 108 (L) 150 - 400 K/uL    Comment: PLATELET COUNT CONFIRMED BY SMEAR  Glucose, capillary     Status: Abnormal   Collection Time: 10/17/16  9:28 AM  Result Value Ref Range   Glucose-Capillary 106 (H) 65 - 99 mg/dL  Glucose, capillary     Status: Abnormal   Collection Time: 10/17/16 12:06 PM  Result Value Ref Range   Glucose-Capillary 140 (H) 65 - 99 mg/dL   Comment 1 Notify RN    Comment 2 Document in Chart   Glucose, capillary     Status: Abnormal   Collection Time: 10/17/16  3:09 PM  Result Value Ref Range   Glucose-Capillary 127 (H) 65 - 99 mg/dL  Glucose, capillary     Status: Abnormal   Collection Time: 10/17/16  7:39 PM  Result Value Ref Range   Glucose-Capillary 108 (H) 65 - 99 mg/dL   Comment 1 Notify RN    Comment 2 Document in Chart   Glucose, capillary     Status: Abnormal   Collection Time: 10/18/16 12:41 AM  Result Value Ref Range   Glucose-Capillary 135 (H) 65 - 99 mg/dL  Glucose, capillary     Status: Abnormal   Collection Time: 10/18/16  4:03 AM  Result Value Ref Range   Glucose-Capillary 138 (H) 65 - 99 mg/dL  Renal function panel     Status: Abnormal   Collection Time: 10/18/16  5:00 AM  Result Value Ref Range   Sodium 150 (H) 135 - 145 mmol/L   Potassium 3.6 3.5 - 5.1 mmol/L   Chloride 117 (H) 101 - 111 mmol/L   CO2 24 22 - 32 mmol/L   Glucose, Bld 140 (H) 65 - 99 mg/dL   BUN 43 (H) 6 - 20 mg/dL   Creatinine, Ser 1.97 (H) 0.44 - 1.00 mg/dL   Calcium 9.2 8.9 - 10.3 mg/dL   Phosphorus 3.4 2.5 - 4.6 mg/dL   Albumin 2.1 (L) 3.5 - 5.0 g/dL   GFR calc non Af Amer 24 (L) >60 mL/min   GFR calc Af Amer 27 (L) >60 mL/min    Comment: (NOTE) The eGFR has been calculated using the CKD EPI equation. This calculation has not been validated in all clinical situations. eGFR's persistently <60 mL/min signify possible Chronic Kidney Disease.     Anion gap 9 5 - 15  Glucose, capillary     Status: Abnormal   Collection Time: 10/18/16  8:10 AM  Result Value Ref Range   Glucose-Capillary 133 (H) 65 - 99 mg/dL   Comment 1 Notify RN    Comment 2 Document in Chart     Dg Chest Port 1 View  Result Date: 10/16/2016 CLINICAL DATA:  Hypoxia EXAM: PORTABLE CHEST 1 VIEW COMPARISON:  October 16, 2016 FINDINGS: Endotracheal tube tip is 3.3 cm above the carina. No pneumothorax. No edema or consolidation. Heart is prominent with pulmonary vascularity within normal limits, stable. No adenopathy. There is extensive arthropathy in each shoulder. IMPRESSION: Endotracheal tube as described without pneumothorax. Stable cardiac prominence. No edema or consolidation. Electronically Signed   By: Lowella Grip III M.D.   On: 10/16/2016 15:12   Dg Chest Port 1 View  Result Date: 10/16/2016 CLINICAL DATA:  Pneumonia EXAM: PORTABLE CHEST 1 VIEW COMPARISON:  10/13/2016 FINDINGS: Endotracheal tube remains in place, unchanged. Interval removal of NG tube. There is cardiomegaly. Improving aeration at the left base with mild residual atelectasis or  infiltrate. No confluent opacity on the right. No effusions. IMPRESSION: Cardiomegaly. Improving left base aeration with mild residual atelectasis or infiltrate. Electronically Signed   By: Rolm Baptise M.D.   On: 10/16/2016 12:07    Review of systems not obtained due to patient factors. Blood pressure (!) 92/56, pulse 61, temperature 97.9 F (36.6 C), temperature source Axillary, resp. rate 14, height _0  (1.676 m), weight 87.1 kg (192 lb 0.3 oz), SpO2 100 %. Patient is awake and aware. She is intubated and therefore nonverbal she answers questions and follows commands readily with gestures. Examination head ears eyes and thirds unremarkable. Her neck is kyphotic and has very poor mobility. She does seem to have a little bit of neck pain but this is not dramatic. She has weakness of her right upper extremity which I  understand is chronic. Her lower extremity function is poor but motor strength is greater than 4/5 bilaterally. Left upper extremity strength appears normal.  Assessment/Plan: Patient has an esophageal perforation with exposed cervical hardware. Plan exploration of her neck with removal of hardware. I will consult otolaryngology for their assistance and additional treatment. Plan for surgery later today.  Maisen Schmit A 10/18/2016, 8:30 AM

## 2016-10-18 NOTE — Progress Notes (Signed)
Gave report to [employer]. Pt belongings sent with her were glasses, a coat, and a white board. No issues when report given.

## 2016-10-18 NOTE — Op Note (Signed)
Date of procedure: 10/18/2016  Date of dictation: Same  Service: Neurosurgery  Preoperative diagnosis: Esophageal perforation with prevertebral abscess and exposed anterior cervical plate instrumentation  Postoperative diagnosis: Same  Procedure Name: Exploration of anterior cervical fusion with removal of hardware. Irrigation and debridement of prevertebral abscess  Surgeon:Loyalty Brashier A.Enma Maeda, M.D.  Asst. Surgeon: None  Anesthesia: General  Indication: 75 year old female approximate 9 years status post multilevel anterior cervical decompression infusion done at an outside institution and [place]. Patient presents with respiratory failure and a supraglottic mass. Exploration of mass in the operating room yesterday demonstrated esophageal perforation with exposed anterior cervical plate hardware. Patient presents now for removal of anterior cervical plate hardware and debridement of prevertebral abscess to hopefully allow better healing of her esophagus. Direct cause of esophageal perforation unknown.  Operative note: After induction of anesthesia, patient position supine. The patient a marked kyphotic deformity of her neck requiring unusual positioning but she was placed in halter traction and or chin was elevated with tape. Anterior cervical regions prepped draped sterilely. Incision was made on the left. Dissection proceeded down to the platysma. Platysma was then divided vertically. Dissection proceeds along the medial border of the sternocleidomastoid muscle and carotid sheath. Trachea and esophagus were mobilized and retracted towards the midline. Dense adhesions complicated exposure. Eventually exposure was obtained and the anterior plate was identified. This is dissected free. The inferior screws were dissected free and removed. The superior screws could not be successfully fully uncovered. I then used the plate to gently dislodge the screws from the body of L2 of C3. There was no evidence of  overt vertebral fracture major problem with this. The plate was removed as were the remaining screws. Wound was then irrigated with and like solution. Throughout the case there was purulence which was drained and irrigated clean. Cultures were not taken because the patient was on antibiotics. A drain was left in her prevertebral space. Wounds and close in layers. Sterile dressing was applied. No apparent complications.

## 2016-10-18 NOTE — Consult Note (Signed)
[facility] for Infectious Disease  Total days of antibiotics 7        Day 7 amp/sub        Day 2 vanco               Reason for Consult: anterior cervical fusion HW infection   Referring Physician: feinstein  Principal Problem:   Sepsis ([place]) Active Problems:   Essential hypertension   Type 2 diabetes mellitus with stage 3 chronic kidney disease, without long-term current use of insulin (HCC)   Chronic combined systolic and diastolic heart failure (HCC)   Non-ischemic cardiomyopathy (HCC)   Acute respiratory failure (HCC)   Acute kidney injury superimposed on CKD ([place])   Pressure injury of skin   PAT (paroxysmal atrial tachycardia) (HCC)   Malnutrition of moderate degree   Laryngeal mass   Hyperosmolality and/or hypernatremia   Anemia   Hypokalemia   Demand ischemia ([place])   CAP (community acquired pneumonia)    HPI: Mandy Rhodes is a 74 y.o. female with hx of CKD 3, HTN, DM, anterior cervical decompression in 2009 who has 2 week course of generalized weakness and shortness of breath. She was seen in the ED for hypercapnic respiratory failure eventually requiring intubation. During intubation, she was found to have a supraglottic mass. Dr Erik Obey from ENT did a laryngoscopy and esophagoscopy, which found  laryngeal and pharyngeal edema but most significantly roughly 20-59m of frank pus released for a 2 x 2cm region of exposed cervical fusion plate behind the cricopharyngeus, concerning for cervical HW infection. She was febrile on 11/24 but remained afebrile since then. She was started on amp/sub and then vancomycin. [facility] for debridement. Unfortunately, no deep tissue culture sent of yet. She has been on 7 days of abtx which will likely impact yield of tissue culture but still worth sending.  Past Medical History:  Diagnosis Date  . Anemia   . Arthritis   . CHF (congestive heart failure) ([place]   . Chronic  kidney disease    Renal Insuffiency, see [facility] once a year  . Diabetes mellitus    type 2  . GERD (gastroesophageal reflux disease)   . History of rotator cuff tear    Torn right rotator cuff.  . History of spinal stenosis   . Hypertension   . Neuropathy associated with endocrine disorder (HCC)     Allergies:  Allergies  Allergen Reactions  . Sulfa Antibiotics Swelling    MEDICATIONS: . ampicillin-sulbactam (UNASYN) IV  3 g Intravenous Q12H  . chlorhexidine gluconate (MEDLINE KIT)  15 mL Mouth Rinse BID  . famotidine (PEPCID) IV  20 mg Intravenous QHS  . heparin subcutaneous  5,000 Units Subcutaneous Q8H  . insulin aspart  0-9 Units Subcutaneous Q4H  . ipratropium  0.5 mg Nebulization Q6H  . levalbuterol  0.63 mg Nebulization Q6H  . mouth rinse  15 mL Mouth Rinse QID  . sodium chloride HYPERTONIC  4 mL Nebulization BID  . vancomycin  1,000 mg Intravenous Q24H    Social History  Substance Use Topics  . Smoking status: Former SResearch scientist (life sciences) . Smokeless tobacco: Never Used     Comment: 12/02/13- quit over 20 years ago  . Alcohol use No    Family History  Problem Relation Age of Onset  . Diabetes type II Mother   . Hypertension Father   . Diabetes type II Sister   .  Ovarian cancer Sister   . Diabetes type II Other   . Stroke Neg Hx   . CAD Neg Hx   . Heart failure Neg Hx     Review of Systems - Unable to obtain due to intubation  OBJECTIVE: Temp:  [96.1 F (35.6 C)-98.5 F (36.9 C)] 98.2 F (36.8 C) (11/29 1525) Pulse Rate:  [50-91] 77 (11/29 1525) Resp:  [11-23] 19 (11/29 1525) BP: (85-167)/(41-128) 106/52 (11/29 1525) SpO2:  [99 %-100 %] 100 % (11/29 1525) Arterial Line BP: (84-127)/(45-63) 101/50 (11/29 0900) FiO2 (%):  [30 %-50 %] 30 % (11/29 1525) Weight:  [192 lb 0.3 oz (87.1 kg)] 192 lb 0.3 oz (87.1 kg) (11/29 0450) Physical Exam  Constitutional:  oriented to person, eyes open.Marland Kitchen appears well-developed and well-nourished. No distress.  HENT:  [place]/AT, PERRLA, no scleral icterus Mouth/Throat: OETT in place Cardiovascular: Normal rate, regular rhythm and normal heart sounds. Exam reveals no gallop and no friction rub.  No murmur heard.  Pulmonary/Chest: Effort normal and breath sounds normal. No respiratory distress.  has no wheezes.  Abdominal: Soft. Bowel sounds are normal.  exhibits no distension. There is no tenderness.  Lymphadenopathy: no cervical adenopathy. No axillary adenopathy Neurological: alert and oriented to person, place, and time. Moves all extremities Skin: Skin is warm and dry. No rash noted. No erythema.    LABS: Results for orders placed or performed during the hospital encounter of 10/12/16 (from the past 48 hour(s))  Glucose, capillary     Status: Abnormal   Collection Time: 10/16/16  4:31 PM  Result Value Ref Range   Glucose-Capillary 124 (H) 65 - 99 mg/dL  Glucose, capillary     Status: None   Collection Time: 10/16/16  7:34 PM  Result Value Ref Range   Glucose-Capillary 94 65 - 99 mg/dL   Comment 1 Notify RN   Glucose, capillary     Status: Abnormal   Collection Time: 10/16/16 11:55 PM  Result Value Ref Range   Glucose-Capillary 104 (H) 65 - 99 mg/dL  Glucose, capillary     Status: None   Collection Time: 10/17/16  3:40 AM  Result Value Ref Range   Glucose-Capillary 88 65 - 99 mg/dL   Comment 1 Notify RN   Magnesium     Status: None   Collection Time: 10/17/16  4:45 AM  Result Value Ref Range   Magnesium 2.2 1.7 - 2.4 mg/dL  Renal function panel     Status: Abnormal   Collection Time: 10/17/16  4:45 AM  Result Value Ref Range   Sodium 152 (H) 135 - 145 mmol/L   Potassium 3.6 3.5 - 5.1 mmol/L   Chloride 120 (H) 101 - 111 mmol/L   CO2 24 22 - 32 mmol/L   Glucose, Bld 114 (H) 65 - 99 mg/dL   BUN 54 (H) 6 - 20 mg/dL   Creatinine, Ser 1.94 (H) 0.44 - 1.00 mg/dL   Calcium 9.3 8.9 - 10.3 mg/dL   Phosphorus 2.6 2.5 - 4.6 mg/dL   Albumin 2.3 (L) 3.5 - 5.0 g/dL   GFR calc non Af Amer 24 (L) >60  mL/min   GFR calc Af Amer 28 (L) >60 mL/min    Comment: (NOTE) The eGFR has been calculated using the CKD EPI equation. This calculation has not been validated in all clinical situations. eGFR's persistently <60 mL/min signify possible Chronic Kidney Disease.    Anion gap 8 5 - 15  TSH     Status: None  Collection Time: 10/17/16  4:45 AM  Result Value Ref Range   TSH 1.027 0.350 - 4.500 uIU/mL    Comment: Performed by a 3rd Generation assay with a functional sensitivity of <=0.01 uIU/mL.  T4, free     Status: Abnormal   Collection Time: 10/17/16  4:45 AM  Result Value Ref Range   Free T4 1.28 (H) 0.61 - 1.12 ng/dL    Comment: (NOTE) Biotin ingestion may interfere with free T4 tests. If the results are inconsistent with the TSH level, previous test results, or the clinical presentation, then consider biotin interference. If needed, order repeat testing after stopping biotin. Performed at [facility]   CBC     Status: Abnormal   Collection Time: 10/17/16  4:45 AM  Result Value Ref Range   WBC 5.2 4.0 - 10.5 K/uL   RBC 2.99 (L) 3.87 - 5.11 MIL/uL   Hemoglobin 8.7 (L) 12.0 - 15.0 g/dL   HCT 26.0 (L) 36.0 - 46.0 %   MCV 87.0 78.0 - 100.0 fL   MCH 29.1 26.0 - 34.0 pg   MCHC 33.5 30.0 - 36.0 g/dL   RDW 16.6 (H) 11.5 - 15.5 %   Platelets 108 (L) 150 - 400 K/uL    Comment: PLATELET COUNT CONFIRMED BY SMEAR  Glucose, capillary     Status: Abnormal   Collection Time: 10/17/16  9:28 AM  Result Value Ref Range   Glucose-Capillary 106 (H) 65 - 99 mg/dL  Glucose, capillary     Status: Abnormal   Collection Time: 10/17/16 12:06 PM  Result Value Ref Range   Glucose-Capillary 140 (H) 65 - 99 mg/dL   Comment 1 Notify RN    Comment 2 Document in Chart   Glucose, capillary     Status: Abnormal   Collection Time: 10/17/16  3:09 PM  Result Value Ref Range   Glucose-Capillary 127 (H) 65 - 99 mg/dL  Glucose, capillary     Status: Abnormal   Collection Time: 10/17/16  7:39 PM    Result Value Ref Range   Glucose-Capillary 108 (H) 65 - 99 mg/dL   Comment 1 Notify RN    Comment 2 Document in Chart   Glucose, capillary     Status: Abnormal   Collection Time: 10/18/16 12:41 AM  Result Value Ref Range   Glucose-Capillary 135 (H) 65 - 99 mg/dL  Glucose, capillary     Status: Abnormal   Collection Time: 10/18/16  4:03 AM  Result Value Ref Range   Glucose-Capillary 138 (H) 65 - 99 mg/dL  Renal function panel     Status: Abnormal   Collection Time: 10/18/16  5:00 AM  Result Value Ref Range   Sodium 150 (H) 135 - 145 mmol/L   Potassium 3.6 3.5 - 5.1 mmol/L   Chloride 117 (H) 101 - 111 mmol/L   CO2 24 22 - 32 mmol/L   Glucose, Bld 140 (H) 65 - 99 mg/dL   BUN 43 (H) 6 - 20 mg/dL   Creatinine, Ser 1.97 (H) 0.44 - 1.00 mg/dL   Calcium 9.2 8.9 - 10.3 mg/dL   Phosphorus 3.4 2.5 - 4.6 mg/dL   Albumin 2.1 (L) 3.5 - 5.0 g/dL   GFR calc non Af Amer 24 (L) >60 mL/min   GFR calc Af Amer 27 (L) >60 mL/min    Comment: (NOTE) The eGFR has been calculated using the CKD EPI equation. This calculation has not been validated in all clinical situations. eGFR's persistently <60 mL/min signify  possible Chronic Kidney Disease.    Anion gap 9 5 - 15  Glucose, capillary     Status: Abnormal   Collection Time: 10/18/16  8:10 AM  Result Value Ref Range   Glucose-Capillary 133 (H) 65 - 99 mg/dL   Comment 1 Notify RN    Comment 2 Document in Chart   Glucose, capillary     Status: Abnormal   Collection Time: 10/18/16 11:39 AM  Result Value Ref Range   Glucose-Capillary 127 (H) 65 - 99 mg/dL  Glucose, capillary     Status: Abnormal   Collection Time: 10/18/16  3:24 PM  Result Value Ref Range   Glucose-Capillary 151 (H) 65 - 99 mg/dL    MICRO: 11/24 blood cx ngtd  Assessment/Plan:  75yo F with hx of anterior cervical fusion discovered to have pharyngeal mass, thought to be abscess with HW exposure. 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

## 2016-10-19 ENCOUNTER — Encounter (HOSPITAL_COMMUNITY): Payer: Self-pay | Admitting: Neurosurgery

## 2016-10-19 ENCOUNTER — Inpatient Hospital Stay (HOSPITAL_COMMUNITY): Payer: Medicare Other

## 2016-10-19 DIAGNOSIS — M4620 Osteomyelitis of vertebra, site unspecified: Secondary | ICD-10-CM

## 2016-10-19 DIAGNOSIS — T8463XD Infection and inflammatory reaction due to internal fixation device of spine, subsequent encounter: Secondary | ICD-10-CM

## 2016-10-19 LAB — GLUCOSE, CAPILLARY
GLUCOSE-CAPILLARY: 164 mg/dL — AB (ref 65–99)
GLUCOSE-CAPILLARY: 155 mg/dL — AB (ref 65–99)
GLUCOSE-CAPILLARY: 159 mg/dL — AB (ref 65–99)
Glucose-Capillary: 103 mg/dL — ABNORMAL HIGH (ref 65–99)
Glucose-Capillary: 113 mg/dL — ABNORMAL HIGH (ref 65–99)
Glucose-Capillary: 131 mg/dL — ABNORMAL HIGH (ref 65–99)

## 2016-10-19 LAB — RENAL FUNCTION PANEL
ANION GAP: 6 (ref 5–15)
Albumin: 1.7 g/dL — ABNORMAL LOW (ref 3.5–5.0)
BUN: 36 mg/dL — ABNORMAL HIGH (ref 6–20)
CALCIUM: 8.7 mg/dL — AB (ref 8.9–10.3)
CO2: 24 mmol/L (ref 22–32)
Chloride: 115 mmol/L — ABNORMAL HIGH (ref 101–111)
Creatinine, Ser: 1.85 mg/dL — ABNORMAL HIGH (ref 0.44–1.00)
GFR calc non Af Amer: 26 mL/min — ABNORMAL LOW (ref >60)
GFR, EST AFRICAN AMERICAN: 30 mL/min — AB (ref >60)
Glucose, Bld: 149 mg/dL — ABNORMAL HIGH (ref 65–99)
POTASSIUM: 3.5 mmol/L (ref 3.5–5.1)
Phosphorus: 2.9 mg/dL (ref 2.5–4.6)
SODIUM: 145 mmol/L (ref 135–145)

## 2016-10-19 LAB — CBC WITH DIFFERENTIAL/PLATELET
BASOS ABS: 0 10*3/uL (ref 0.0–0.1)
BASOS PCT: 0 %
Eosinophils Absolute: 0.1 10*3/uL (ref 0.0–0.7)
Eosinophils Relative: 2 %
HEMATOCRIT: 23.9 % — AB (ref 36.0–46.0)
HEMOGLOBIN: 7.9 g/dL — AB (ref 12.0–15.0)
LYMPHS PCT: 13 %
Lymphs Abs: 0.9 10*3/uL (ref 0.7–4.0)
MCH: 28.7 pg (ref 26.0–34.0)
MCHC: 33.1 g/dL (ref 30.0–36.0)
MCV: 86.9 fL (ref 78.0–100.0)
Monocytes Absolute: 0.7 10*3/uL (ref 0.1–1.0)
Monocytes Relative: 10 %
NEUTROS ABS: 5 10*3/uL (ref 1.7–7.7)
NEUTROS PCT: 75 %
Platelets: 105 10*3/uL — ABNORMAL LOW (ref 150–400)
RBC: 2.75 MIL/uL — ABNORMAL LOW (ref 3.87–5.11)
RDW: 16.3 % — ABNORMAL HIGH (ref 11.5–15.5)
WBC: 6.7 10*3/uL (ref 4.0–10.5)

## 2016-10-19 LAB — BASIC METABOLIC PANEL
ANION GAP: 6 (ref 5–15)
BUN: 35 mg/dL — ABNORMAL HIGH (ref 6–20)
CALCIUM: 8.6 mg/dL — AB (ref 8.9–10.3)
CO2: 23 mmol/L (ref 22–32)
Chloride: 116 mmol/L — ABNORMAL HIGH (ref 101–111)
Creatinine, Ser: 1.87 mg/dL — ABNORMAL HIGH (ref 0.44–1.00)
GFR, EST AFRICAN AMERICAN: 29 mL/min — AB (ref >60)
GFR, EST NON AFRICAN AMERICAN: 25 mL/min — AB (ref >60)
Glucose, Bld: 147 mg/dL — ABNORMAL HIGH (ref 65–99)
POTASSIUM: 3.6 mmol/L (ref 3.5–5.1)
Sodium: 145 mmol/L (ref 135–145)

## 2016-10-19 MED ORDER — SODIUM CHLORIDE 0.9 % IV SOLN
30.0000 meq | Freq: Once | INTRAVENOUS | Status: AC
  Administered 2016-10-19: 30 meq via INTRAVENOUS
  Filled 2016-10-19: qty 15

## 2016-10-19 NOTE — Progress Notes (Signed)
Pharmacy Antibiotic Note  Mandy Rhodes is a 75 y.o. female with neck abscess   Plan: - continue Unasyn 3gm IV q12h - vancomycin 1000 mg IV q24h  Height: 5\' 6"  (167.6 cm) Weight: 196 lb 6.9 oz (89.1 kg) IBW/kg (Calculated) : 59.3  Temp (24hrs), Avg:97.4 F (36.3 C), Min:96.8 F (36 C), Max:98.5 F (36.9 C)   Recent Labs Lab 10/13/16 0310 10/13/16 0534  10/13/16 2109 10/14/16 0404 10/15/16 0645 10/16/16 0332 10/17/16 0445 10/18/16 0500 10/18/16 2206 10/19/16 0500  WBC  --  10.2  < >  --  6.2 6.4 6.0 5.2  --   --  6.7  CREATININE  --  1.85*  < >  --  2.18* 2.22* 2.12* 1.94* 1.97* 1.88* 1.85*  1.87*  LATICACIDVEN 2.4* 1.8  --  1.4  --   --   --   --   --   --   --   < > = values in this interval not displayed.  Estimated Creatinine Clearance: 29.5 mL/min (by C-G formula based on SCr of 1.85 mg/dL (H)).    Allergies  Allergen Reactions  . Sulfa Antibiotics Swelling   Levester Fresh, PharmD, BCPS, BCCCP Clinical Pharmacist Clinical phone for 10/19/2016 from 7a-3:30p: [phone] If after 3:30p, please call main pharmacy at: [phone] 10/19/2016 9:19 AM

## 2016-10-19 NOTE — Progress Notes (Signed)
Postoperative day 1. Patient resting comfortably on ventilator. Awakens to voice. Follows commands bilaterally. Neurologic exam appears unchanged. : On the left side. Drain functioning well. Airway midline.  Status post neck exploration with removal of anterior cervical hardware. Patient with known esophageal perforation. Continue drain and airway protection for now. Continue antibiotics.

## 2016-10-19 NOTE — Progress Notes (Signed)
RT NOTE:  CPT held due to post op surgery to neck & pain.

## 2016-10-19 NOTE — Progress Notes (Addendum)
[facility] for Infectious Disease    Date of Admission:  10/12/2016   Total days of antibiotics 8        Day 8 amp/sub        Day 3 vanco           ID: Mandy Rhodes is a 75 y.o. female with hx of anterior cervical fusion discovered to have pharyngeal mass, thought to be abscess with HW exposure. 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 Principal Problem:   Sepsis ([place]) Active Problems:   Essential hypertension   Type 2 diabetes mellitus with stage 3 chronic kidney disease, without long-term current use of insulin (HCC)   Chronic combined systolic and diastolic heart failure (HCC)   Non-ischemic cardiomyopathy (HCC)   Acute hypercapnic respiratory failure (HCC)   Acute kidney injury superimposed on CKD (HCC)   Pressure injury of skin   PAT (paroxysmal atrial tachycardia) (HCC)   Malnutrition of moderate degree   Laryngeal mass   Hyperosmolality and/or hypernatremia   Anemia   Hypokalemia   Demand ischemia ([place])   CAP (community acquired pneumonia)   Acute encephalopathy    Subjective: Afebrile, remains intubated, low vent settings. Denies significant discomfort  Interval event: she went to the OR last night for debridement and HW removal by Dr Annette Stable. He was able to I x D prevertebral abscess as well as remove anterior cervical plate with screws. Drain left in place in prevertebral space. cx sent from or  Medications:  . ampicillin-sulbactam (UNASYN) IV  3 g Intravenous Q12H  . chlorhexidine gluconate (MEDLINE KIT)  15 mL Mouth Rinse BID  . famotidine (PEPCID) IV  20 mg Intravenous QHS  . heparin subcutaneous  5,000 Units Subcutaneous Q8H  . insulin aspart  0-9 Units Subcutaneous Q4H  . ipratropium  0.5 mg Nebulization Q6H  . levalbuterol  0.63 mg Nebulization Q6H  . mouth rinse  15 mL Mouth Rinse QID  . vancomycin  1,000 mg Intravenous Q24H    Objective: Vital signs in last 24 hours: Temp:  [96.8 F (36 C)-98.6 F (37 C)] 98.2 F (36.8 C) (11/30  1900) Pulse Rate:  [55-102] 76 (11/30 2023) Resp:  [0-29] 29 (11/30 2023) BP: (82-113)/(47-80) 108/57 (11/30 2023) SpO2:  [99 %-100 %] 100 % (11/30 2023) Arterial Line BP: (91-119)/(43-58) 99/52 (11/30 0800) FiO2 (%):  [30 %] 30 % (11/30 2023) Weight:  [196 lb 6.9 oz (89.1 kg)] 196 lb 6.9 oz (89.1 kg) (11/30 0500) Physical Exam  Constitutional:  oriented to person, place, and time.easily awakened. appears well-developed and well-nourished. No distress.  HENT: [place]/AT, PERRLA, no scleral icterus Mouth/Throat: OETT in place Neck: has drain in place, with serosanginuos, and fibrinous debris in bulb  Cardiovascular: Normal rate, regular rhythm and normal heart sounds. Exam reveals no gallop and no friction rub.  No murmur heard.  Pulmonary/Chest: Effort normal and breath sounds normal. No respiratory distress.  has no wheezes.  Abdominal: Soft. Bowel sounds are normal.  exhibits no distension. There is no tenderness.  Neurological: alert and oriented to person, place, and time.  Skin: Skin is warm and dry. No rash noted. No erythema.    Lab Results  Recent Labs  10/17/16 0445  10/18/16 2206 10/19/16 0500  WBC 5.2  --   --  6.7  HGB 8.7*  --   --  7.9*  HCT 26.0*  --   --  23.9*  NA 152*  < > 148* 145  145  K  3.6  < > 3.3* 3.5  3.6  CL 120*  < > 118* 115*  116*  CO2 24  < > '24 24  23  ' BUN 54*  < > 36* 36*  35*  CREATININE 1.94*  < > 1.88* 1.85*  1.87*  < > = values in this interval not displayed. Liver Panel  Recent Labs  10/18/16 0500 10/19/16 0500  ALBUMIN 2.1* 1.7*    Microbiology: 11/24 blood cx ngtd 11/24 resp cx normal flora Studies/Results: Dg Chest Port 1 View  Result Date: 10/19/2016 CLINICAL DATA:  Check endotracheal tube placement EXAM: PORTABLE CHEST 1 VIEW COMPARISON:  10/16/2016 FINDINGS: Endotracheal tube and nasogastric catheter are noted in satisfactory position. Cardiac shadow remains enlarged. The lungs are clear bilaterally. No bony abnormality  is seen. IMPRESSION: No acute abnormality noted. Electronically Signed   By: Inez Catalina M.D.   On: 10/19/2016 07:34   Dg Abd Portable 1v  Result Date: 10/19/2016 CLINICAL DATA:  Evaluate orogastric tube position. EXAM: PORTABLE ABDOMEN - 1 VIEW COMPARISON:  Abdominal radiograph of October 13, 2016 FINDINGS: The proximal port of the orogastric tube lies just above the GE junction. The tip lies at the junction of the cardia and proximal body of the stomach. IMPRESSION: Advancement of the orogastric tube by a 10 cm is recommended to assure that the proximal port lies below the GE junction. Electronically Signed   By: David  Martinique M.D.   On: 10/19/2016 10:11     Assessment/Plan: 75yo F with hx of anterior cervical fusion discovered to have pharyngeal mass, c/w abscess with HW-anterior cervical plate exposure. 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 Unclear if pathogen would grow, abit disappointing not to have more micro data to narrow her regimen  - consider sending fluid from current drain  - recommend to treat 8 wk of IV therapy. Will keep on current regimen for now  - dr hatcher to provider further [place], [facility] Cell: [phone] Pager: [phone]  10/19/2016, 8:48 PM

## 2016-10-19 NOTE — Progress Notes (Signed)
PULMONARY / CRITICAL CARE MEDICINE   Name: Mandy Rhodes MRN: [id number] DOB: Mar 31, 1941    ADMISSION DATE:  10/12/2016 CONSULTATION DATE:  10/13/2016  REFERRING MD:  Dr. Roxanne Mins EDP  CHIEF COMPLAINT:  Shortness of breath  HISTORY OF PRESENT ILLNESS:  75 y.o. female with past medical history as below, which is significant for diabetes, CHF, chronic kidney disease, and hypertension. She presented to [facility] emergency department 10/12/2016 with complaints of shortness of breath and generalized weakness for about 2 weeks. She has additionally suffered a 30 pound weight loss over some recent period of time which is not entirely clear. Recently treated with antibiotics as an outpatient for suspected bronchitis vs pneumonia.Upon arrival to the emergency department she was on 4 L of supplemental O2 via nasal cannula and still had increased work of breathing.due to her somnolence ABG was ordered which showed  Hypercapnic respiratory failure and she was started on BiPAP. She was known to be a DO NOT RESUSCITATE, however,  Family has decided to proceed with intubation, which was done in the emergency department. During intubation a supraglottic mass was noted by emergency room provider which made intubation difficult. Laboratory evaluation significant forserum creatinine 1.8, calcium 10.5, troponin 0.53, WBC 12, glucose 137.PCCM asked to admit  SUBJECTIVE: To OR exploration done remained on vent  VITAL SIGNS: BP (!) 104/55   Pulse 69   Temp 98.4 F (36.9 C) (Core (Comment))   Resp 14   Ht 5\' 6"  (1.676 m)   Wt 89.1 kg (196 lb 6.9 oz)   SpO2 100%   BMI 31.70 kg/m   HEMODYNAMICS:    VENTILATOR SETTINGS: Vent Mode: PSV;CPAP FiO2 (%):  [30 %] 30 % Set Rate:  [14 bmp] 14 bmp Vt Set:  [470 mL] 470 mL PEEP:  [5 cmH20] 5 cmH20 Pressure Support:  [5 cmH20] 5 cmH20 Plateau Pressure:  [16 cmH20-21 cmH20] 18 cmH20  INTAKE / OUTPUT: I/O last 3 completed shifts: In: 5074 [I.V.:4209;  IV Piggyback:865] Out: 810 [Urine:550; Drains:60; Blood:200]  PHYSICAL EXAMINATION: General:  Eyes open no distress Neuro:  Following commands. Grossly nonfocal HEENT:  Drain in neck, swelling Cardiovascular:  s1 s2 RRR Pulmonary: clear Abdomen:  Soft.NT no r/g Musculoskeletal:  No joint effusion or deformity appreciated. Skin:  Warm and dry. no rash on exposed skin.  LABS:  BMET  Recent Labs Lab 10/18/16 0500 10/18/16 2206 10/19/16 0500  NA 150* 148* 145  145  K 3.6 3.3* 3.5  3.6  CL 117* 118* 115*  116*  CO2 24 24 24  23   BUN 43* 36* 36*  35*  CREATININE 1.97* 1.88* 1.85*  1.87*  GLUCOSE 140* 121* 149*  147*    Electrolytes  Recent Labs Lab 10/15/16 0645 10/16/16 0332 10/17/16 0445 10/18/16 0500 10/18/16 2206 10/19/16 0500  CALCIUM 9.2 9.2 9.3 9.2 8.8* 8.7*  8.6*  MG 2.4 2.4 2.2  --   --   --   PHOS 3.2 2.4* 2.6 3.4  --  2.9    CBC  Recent Labs Lab 10/16/16 0332 10/17/16 0445 10/19/16 0500  WBC 6.0 5.2 6.7  HGB 8.7* 8.7* 7.9*  HCT 26.6* 26.0* 23.9*  PLT 127* 108* 105*    Coag's  Recent Labs Lab 10/13/16 0058  APTT 32  INR 1.22    Sepsis Markers  Recent Labs Lab 10/13/16 0310 10/13/16 0534 10/13/16 2109 10/14/16 0404 10/15/16 0645  LATICACIDVEN 2.4* 1.8 1.4  --   --   PROCALCITON <0.10  --   --  <  0.10 <0.10    ABG  Recent Labs Lab 10/13/16 0202 10/13/16 0443 10/16/16 1205  PHART 7.539* 7.509* 7.429  PCO2ART 29.8* 33.5 40.4  PO2ART 51.1* 53.4* 108    Liver Enzymes  Recent Labs Lab 10/12/16 2302  10/17/16 0445 10/18/16 0500 10/19/16 0500  AST 33  --   --   --   --   ALT 30  --   --   --   --   ALKPHOS 97  --   --   --   --   BILITOT 1.0  --   --   --   --   ALBUMIN 3.3*  < > 2.3* 2.1* 1.7*  < > = values in this interval not displayed.  Cardiac Enzymes  Recent Labs Lab 10/13/16 0310 10/13/16 1033 10/13/16 1716  TROPONINI 0.64* 0.87* 1.09*    Glucose  Recent Labs Lab 10/18/16 1139  10/18/16 1524 10/18/16 2133 10/18/16 2328 10/19/16 0324 10/19/16 0805  GLUCAP 127* 151* 116* 139* 164* 155*    Imaging Dg Chest Port 1 View  Result Date: 10/19/2016 CLINICAL DATA:  Check endotracheal tube placement EXAM: PORTABLE CHEST 1 VIEW COMPARISON:  10/16/2016 FINDINGS: Endotracheal tube and nasogastric catheter are noted in satisfactory position. Cardiac shadow remains enlarged. The lungs are clear bilaterally. No bony abnormality is seen. IMPRESSION: No acute abnormality noted. Electronically Signed   By: Inez Catalina M.D.   On: 10/19/2016 07:34    STUDIES:  CT CHEST/NECK/SOFT TISSUE W/O 11/24: IMPRESSION: 1. Fluid and debris throughout the oropharynx, hypopharynx, and supraglottic airway from intubation. Suboptimal evaluation of the mucosa due to airway opacification and lack of contrast. Follow-up contrast examination after extubation and/or direct visualization recommended for evaluation of the supraglottic mass. 2. No cervical lymphadenopathy identified. 3. Left lower lobe consolidation and nodular opacities throughout the lungs compatible with pneumonia. Small left pleural effusion. 4. Endotracheal tube 1 cm from carina, retraction recommended. 5. Enteric tube tip below the field of view in the stomach. 6. Moderate coronary artery calcifications. 7. Borderline main pulmonary artery may represent pulmonary artery hypertension. CT HEAD W/O 11/24:  No acute intracranial abnormality. Moderate brain parenchymal atrophy and chronic microvascular disease. TTE 11/24: LV normal in size with EF 30-35%. Moderate concentric LVH. Appearance consistent with infiltrative cardiomyopathy. Moderate diffuse hypokinesis without regional variation. Grade 2 diastolic dysfunction. LA moderately dilated & RA normal in size. RV normal in size and function. No aortic stenosis or regurgitation. Aortic root normal in size. Trivial mitral regurgitation without stenosis. No pulmonic regurgitation. Mild  tricuspid regurgitation. No pericardial effusion. 11/28: Direct laryngoscopy, esophagoscopy: Laryngeal and pharyngeal edema.  2x2 cm area of exposed cervical esophageal fusion plate just behind cricopharyngeus. 20-30 mL frank pus released. No cancer identified  MICROBIOLOGY: MRSA PCR 11/24:  Negative Blood Ctx x2 11/24 >>  Urine Legionella Ag 11/24 >> Urine Streptococcal Ag 11/24:  Negative Respiratory Viral Panel PCR 11/24 (Tracheal):  Negative Tracheal Aspirate Ctx 11/24 >>  ANTIBIOTICS: Unasyn 11/24 >>  vanc 11/28>>>  SIGNIFICANT EVENTS: 11/24 - intubated, admitted to ICU. 11/28 Now back from surgery, C-plate perforated thru esophagus 11/29- OR for removal hardware   LINES/TUBES: OETT 7.5 11/23 >> OGT 11/23 >>out FOLEY 11/23 >> PIV  ASSESSMENT / PLAN:  INFECTIOUS A:   PNA, asp? Infected cervicle hardware likley oseto P:   continued current abx Follow cultures further,  If nO MRSA, would dc vanc  PULMONARY A: Acute Hypercarbic Respiratory Failure Aspiration PNA  H/O Tobacco Use - Cigarettes &  chewing tobacco  P:   pcxr improving overall Even balance goals Weaning ps 5, cpap 5, no extubation planned Xopenex & Atrovent Nebs q6hr Scheduled Keep same MV  CARDIOVASCULAR A:  NSTEMI/Elevated Troponin I - S/P 300mg  Rectal ASA. SVT - Seen since admission. Borderline Hypotension - Likely due to hypovolemia.  Probable Infiltrative Cardiomyopathy - Seen on TTE. Question amyloidosis.  H/O CHF H/O HTN H/o Hyperlipidemia  P:  Continuous Telemetry Monitoring Even balance goals to pos  RENAL A:   Acute Renal Failure Hypernatremia  H/O CKD Stage   P:   Am chem  D5W to 50  GASTROINTESTINAL A:   Moderate Protein-Calorie Malnutrition H/O GERD  P:   NPO Gastric tube in place, KUB confirm, then want to feed I feel strongly she will need peg eventually  HEMATOLOGIC/ONCOLOGIC A:   Anemia - dilutional  P:  Cbc in am , may need Tx with pos free water  balance scd  ENDOCRINE A:   H/O Diabetes Mellitus Type 2 - A1c 7.2.  P:   SSI per Sensitive Algorithm Accu-Checks q4hr  NEUROLOGIC A:   Acute Encephalopathy resolved H/O Neuropathy - Secondary to diabetes.  P:   RASS goal: 0 to -1 fent prn WUA active  FAMILY  - Updates: I updated the pt  - Inter-disciplinary family meet or Palliative Care meeting due by:  11/30   Ccm time 30 min   Lavon Paganini. Titus Mould, MD, [place] Pgr: [facility]

## 2016-10-19 NOTE — Care Management Important Message (Signed)
Important Message  Patient Details  Name: Mandy Rhodes MRN: [id number] Date of Birth: 1941/05/04   Medicare Important Message Given:  Other (see comment)    [place], Nyajah Hyson Abena 10/19/2016, 10:07 AM

## 2016-10-20 DIAGNOSIS — Z9689 Presence of other specified functional implants: Secondary | ICD-10-CM

## 2016-10-20 DIAGNOSIS — J390 Retropharyngeal and parapharyngeal abscess: Secondary | ICD-10-CM

## 2016-10-20 DIAGNOSIS — Z794 Long term (current) use of insulin: Secondary | ICD-10-CM

## 2016-10-20 LAB — GLUCOSE, CAPILLARY
GLUCOSE-CAPILLARY: 104 mg/dL — AB (ref 65–99)
GLUCOSE-CAPILLARY: 114 mg/dL — AB (ref 65–99)
GLUCOSE-CAPILLARY: 116 mg/dL — AB (ref 65–99)
GLUCOSE-CAPILLARY: 99 mg/dL (ref 65–99)
Glucose-Capillary: 130 mg/dL — ABNORMAL HIGH (ref 65–99)
Glucose-Capillary: 118 mg/dL — ABNORMAL HIGH (ref 65–99)

## 2016-10-20 LAB — PHOSPHORUS
PHOSPHORUS: 3 mg/dL (ref 2.5–4.6)
PHOSPHORUS: 3.4 mg/dL (ref 2.5–4.6)

## 2016-10-20 LAB — RENAL FUNCTION PANEL
Albumin: 2.2 g/dL — ABNORMAL LOW (ref 3.5–5.0)
Anion gap: 7 (ref 5–15)
BUN: 25 mg/dL — AB (ref 6–20)
CHLORIDE: 98 mmol/L — AB (ref 101–111)
CO2: 33 mmol/L — AB (ref 22–32)
CREATININE: 0.76 mg/dL (ref 0.44–1.00)
Calcium: 8 mg/dL — ABNORMAL LOW (ref 8.9–10.3)
GFR calc non Af Amer: >60 mL/min (ref >60)
Glucose, Bld: 199 mg/dL — ABNORMAL HIGH (ref 65–99)
Phosphorus: 3.4 mg/dL (ref 2.5–4.6)
Potassium: 4.7 mmol/L (ref 3.5–5.1)
Sodium: 138 mmol/L (ref 135–145)

## 2016-10-20 LAB — MAGNESIUM
MAGNESIUM: 2 mg/dL (ref 1.7–2.4)
Magnesium: 2 mg/dL (ref 1.7–2.4)

## 2016-10-20 LAB — BASIC METABOLIC PANEL
Anion gap: 8 (ref 5–15)
BUN: 29 mg/dL — AB (ref 6–20)
CALCIUM: 8.7 mg/dL — AB (ref 8.9–10.3)
CHLORIDE: 113 mmol/L — AB (ref 101–111)
CO2: 21 mmol/L — ABNORMAL LOW (ref 22–32)
CREATININE: 1.97 mg/dL — AB (ref 0.44–1.00)
GFR calc Af Amer: 27 mL/min — ABNORMAL LOW (ref >60)
GFR calc non Af Amer: 24 mL/min — ABNORMAL LOW (ref >60)
Glucose, Bld: 122 mg/dL — ABNORMAL HIGH (ref 65–99)
Potassium: 3.8 mmol/L (ref 3.5–5.1)
SODIUM: 142 mmol/L (ref 135–145)

## 2016-10-20 MED ORDER — VITAL AF 1.2 CAL PO LIQD
1000.0000 mL | ORAL | Status: DC
  Administered 2016-10-20: 1000 mL

## 2016-10-20 MED ORDER — SODIUM CHLORIDE 0.9 % IV SOLN
3.0000 g | Freq: Two times a day (BID) | INTRAVENOUS | Status: DC
  Administered 2016-10-20 – 2016-10-23 (×6): 3 g via INTRAVENOUS
  Filled 2016-10-20 (×8): qty 3

## 2016-10-20 MED ORDER — VITAL HIGH PROTEIN PO LIQD
1000.0000 mL | ORAL | Status: DC
Start: 2016-10-20 — End: 2016-10-20
  Administered 2016-10-20: 1000 mL

## 2016-10-20 MED ORDER — FAMOTIDINE 40 MG/5ML PO SUSR: 20.0000 mg | Freq: Every day | ORAL | Status: DC

## 2016-10-20 MED ORDER — ORAL CARE MOUTH RINSE
15.0000 mL | Freq: Two times a day (BID) | OROMUCOSAL | Status: DC
  Administered 2016-10-20 – 2016-10-22 (×4): 15 mL via OROMUCOSAL

## 2016-10-20 MED ORDER — PANTOPRAZOLE SODIUM 40 MG IV SOLR
40.0000 mg | INTRAVENOUS | Status: DC
  Administered 2016-10-20 – 2016-10-26 (×7): 40 mg via INTRAVENOUS
  Filled 2016-10-20 (×7): qty 40

## 2016-10-20 MED ORDER — SODIUM CHLORIDE 0.9 % IV SOLN
3.0000 g | Freq: Three times a day (TID) | INTRAVENOUS | Status: DC
  Filled 2016-10-20: qty 3

## 2016-10-20 MED ORDER — PRO-STAT SUGAR FREE PO LIQD
30.0000 mL | Freq: Three times a day (TID) | ORAL | Status: DC
  Administered 2016-10-20 (×2): 30 mL
  Filled 2016-10-20: qty 30

## 2016-10-20 NOTE — Procedures (Signed)
Extubation Procedure Note  Patient Details:   Name: Mandy Rhodes DOB: 1941/04/12 MRN: [id number]   Airway Documentation:     Evaluation  O2 sats: stable throughout Complications: No apparent complications Patient did tolerate procedure well. Bilateral Breath Sounds: Rhonchi, Diminished   Yes   Pt extubated to 4L [place] per MD order. Pt VS within normal limits. Pt able to speak her name and has a strong productive cough post extubation with copious amounts of oral secretions. RT will continue to closely monitor pt.   Jesse Sans 10/20/2016, 3:34 PM

## 2016-10-20 NOTE — Progress Notes (Signed)
INFECTIOUS DISEASE PROGRESS NOTE  ID: Mandy Rhodes is a 75 y.o. female with  Principal Problem:   Sepsis ([place]) Active Problems:   Essential hypertension   Type 2 diabetes mellitus with stage 3 chronic kidney disease, without long-term current use of insulin (HCC)   Chronic combined systolic and diastolic heart failure (HCC)   Non-ischemic cardiomyopathy (HCC)   Acute hypercapnic respiratory failure (HCC)   Acute kidney injury superimposed on CKD (HCC)   Pressure injury of skin   PAT (paroxysmal atrial tachycardia) (HCC)   Malnutrition of moderate degree   Laryngeal mass   Hyperosmolality and/or hypernatremia   Anemia   Hypokalemia   Demand ischemia ([place])   CAP (community acquired pneumonia)   Acute encephalopathy   Vertebral osteomyelitis ([place])  Subjective: Awake and alert.  Responds to questions. Comfortable.    Abtx:  Anti-infectives    Start     Dose/Rate Route Frequency Ordered Stop   10/20/16 1800  Ampicillin-Sulbactam (UNASYN) 3 g in sodium chloride 0.9 % 100 mL IVPB     3 g 200 mL/hr over 30 Minutes Intravenous Every 12 hours 10/20/16 0756     10/20/16 1400  Ampicillin-Sulbactam (UNASYN) 3 g in sodium chloride 0.9 % 100 mL IVPB  Status:  Discontinued     3 g 200 mL/hr over 30 Minutes Intravenous Every 8 hours 10/20/16 0744 10/20/16 0756   10/18/16 1938  bacitracin 50,000 Units in sodium chloride irrigation 0.9 % 500 mL irrigation  Status:  Discontinued       As needed 10/18/16 1954 10/18/16 2059   10/17/16 1000  vancomycin (VANCOCIN) IVPB 1000 mg/200 mL premix     1,000 mg 200 mL/hr over 60 Minutes Intravenous Every 24 hours 10/17/16 0943     10/13/16 1700  Ampicillin-Sulbactam (UNASYN) 3 g in sodium chloride 0.9 % 100 mL IVPB  Status:  Discontinued     3 g 200 mL/hr over 30 Minutes Intravenous Every 12 hours 10/13/16 0714 10/20/16 0744   10/13/16 0300  Ampicillin-Sulbactam (UNASYN) 3 g in sodium chloride 0.9 % 100 mL IVPB  Status:  Discontinued     3  g 200 mL/hr over 30 Minutes Intravenous Every 8 hours 10/13/16 0246 10/13/16 0714      Medications:  Scheduled: . ampicillin-sulbactam (UNASYN) IV  3 g Intravenous Q12H  . chlorhexidine gluconate (MEDLINE KIT)  15 mL Mouth Rinse BID  . famotidine  20 mg Per Tube QHS  . feeding supplement (VITAL HIGH PROTEIN)  1,000 mL Per Tube Q24H  . heparin subcutaneous  5,000 Units Subcutaneous Q8H  . insulin aspart  0-9 Units Subcutaneous Q4H  . ipratropium  0.5 mg Nebulization Q6H  . levalbuterol  0.63 mg Nebulization Q6H  . mouth rinse  15 mL Mouth Rinse QID  . vancomycin  1,000 mg Intravenous Q24H    Objective: Vital signs in last 24 hours: Temp:  [97.9 F (36.6 C)-99.1 F (37.3 C)] 99.1 F (37.3 C) (12/01 0753) Pulse Rate:  [59-107] 69 (12/01 0753) Resp:  [0-29] 14 (12/01 0753) BP: (79-118)/(36-80) 113/71 (12/01 0753) SpO2:  [99 %-100 %] 100 % (12/01 0753) FiO2 (%):  [30 %] 30 % (12/01 0753) Weight:  [91.1 kg (200 lb 13.4 oz)] 91.1 kg (200 lb 13.4 oz) (12/01 0753)   General appearance: alert, cooperative and no distress Neck: wound clean, drain in place.  Resp: diminished breath sounds anterior - bilateral Cardio: regular rate and rhythm GI: abnormal findings:  hypoactive bowel sounds and soft,  non-tender.   Lab Results  Recent Labs  10/19/16 0500 10/20/16 0427 10/20/16 0923  WBC 6.7  --   --   HGB 7.9*  --   --   HCT 23.9*  --   --   NA 145  145 138 142  K 3.5  3.6 4.7 3.8  CL 115*  116* 98* 113*  CO2 24  23 33* 21*  BUN 36*  35* 25* 29*  CREATININE 1.85*  1.87* 0.76 1.97*   Liver Panel  Recent Labs  10/19/16 0500 10/20/16 0427  ALBUMIN 1.7* 2.2*   Sedimentation Rate No results for input(s): ESRSEDRATE in the last 72 hours. C-Reactive Protein No results for input(s): CRP in the last 72 hours.  Microbiology: Recent Results (from the past 240 hour(s))  MRSA PCR Screening     Status: None   Collection Time: 10/13/16  5:03 AM  Result Value Ref Range  Status   MRSA by PCR NEGATIVE NEGATIVE Final    Comment:        The GeneXpert MRSA Assay (FDA approved for NASAL specimens only), is one component of a comprehensive MRSA colonization surveillance program. It is not intended to diagnose MRSA infection nor to guide or monitor treatment for MRSA infections.   Culture, blood (routine x 2)     Status: None   Collection Time: 10/13/16  5:34 AM  Result Value Ref Range Status   Specimen Description BLOOD LEFT ANTECUBITAL  Final   Special Requests BOTTLES DRAWN AEROBIC ONLY 5CC  Final   Culture   Final    NO GROWTH 5 DAYS Performed at [facility]    Report Status 10/18/2016 FINAL  Final  Culture, blood (routine x 2)     Status: None   Collection Time: 10/13/16  5:34 AM  Result Value Ref Range Status   Specimen Description BLOOD LEFT ARM  Final   Special Requests BOTTLES DRAWN AEROBIC ONLY 5CC  Final   Culture   Final    NO GROWTH 5 DAYS Performed at [facility]    Report Status 10/18/2016 FINAL  Final  Culture, respiratory (tracheal aspirate)     Status: None   Collection Time: 10/13/16 11:38 AM  Result Value Ref Range Status   Specimen Description TRACHEAL ASPIRATE  Final   Special Requests Normal  Final   Gram Stain   Final    ABUNDANT WBC PRESENT, PREDOMINANTLY PMN RARE SQUAMOUS EPITHELIAL CELLS PRESENT FEW GRAM POSITIVE COCCI IN PAIRS RARE GRAM NEGATIVE RODS RARE BUDDING YEAST SEEN    Culture   Final    Consistent with normal respiratory flora. Performed at [facility]    Report Status 10/16/2016 FINAL  Final  Respiratory Panel by PCR     Status: None   Collection Time: 10/13/16 11:59 AM  Result Value Ref Range Status   Adenovirus NOT DETECTED NOT DETECTED Final   Coronavirus 229E NOT DETECTED NOT DETECTED Final   Coronavirus HKU1 NOT DETECTED NOT DETECTED Final   Coronavirus NL63 NOT DETECTED NOT DETECTED Final   Coronavirus OC43 NOT DETECTED NOT DETECTED Final   Metapneumovirus NOT  DETECTED NOT DETECTED Final   Rhinovirus / Enterovirus NOT DETECTED NOT DETECTED Final   Influenza A NOT DETECTED NOT DETECTED Final   Influenza B NOT DETECTED NOT DETECTED Final   Parainfluenza Virus 1 NOT DETECTED NOT DETECTED Final   Parainfluenza Virus 2 NOT DETECTED NOT DETECTED Final   Parainfluenza Virus 3 NOT DETECTED NOT DETECTED Final  Parainfluenza Virus 4 NOT DETECTED NOT DETECTED Final   Respiratory Syncytial Virus NOT DETECTED NOT DETECTED Final   Bordetella pertussis NOT DETECTED NOT DETECTED Final   Chlamydophila pneumoniae NOT DETECTED NOT DETECTED Final   Mycoplasma pneumoniae NOT DETECTED NOT DETECTED Final    Comment: Performed at [facility]    Studies/Results: Dg Chest Port 1 View  Result Date: 10/19/2016 CLINICAL DATA:  Check endotracheal tube placement EXAM: PORTABLE CHEST 1 VIEW COMPARISON:  10/16/2016 FINDINGS: Endotracheal tube and nasogastric catheter are noted in satisfactory position. Cardiac shadow remains enlarged. The lungs are clear bilaterally. No bony abnormality is seen. IMPRESSION: No acute abnormality noted. Electronically Signed   By: Inez Catalina M.D.   On: 10/19/2016 07:34   Dg Abd Portable 1v  Result Date: 10/19/2016 CLINICAL DATA:  Evaluate orogastric tube position. EXAM: PORTABLE ABDOMEN - 1 VIEW COMPARISON:  Abdominal radiograph of October 13, 2016 FINDINGS: The proximal port of the orogastric tube lies just above the GE junction. The tip lies at the junction of the cardia and proximal body of the stomach. IMPRESSION: Advancement of the orogastric tube by a 10 cm is recommended to assure that the proximal port lies below the GE junction. Electronically Signed   By: David  Martinique M.D.   On: 10/19/2016 10:11     Assessment/Plan: Retropharyngeal Abscess Cervical plate (removed 81-27)   Cx sent from drain today (not OR). Will continue current anbx for now while awaiting Cx WBC better, afebrile  Possible extubation  CKD Cr worse  today.   DM Glc well controlled.   Total days of antibiotics:  Day 7 unsayn Day 3 vanco         Bobby Rumpf Infectious Diseases (pager) [phone] [email or web address] 10/20/2016, 11:25 AM  LOS: 7 days

## 2016-10-20 NOTE — Progress Notes (Signed)
Pt w/ large amount clear oral secretions, strong cough.  Vomited moderate amt clear liquid.  PRN Zofran given, Dr. Titus Mould at bedside, OGT removed & pt made strict NPO per order.

## 2016-10-20 NOTE — Progress Notes (Signed)
RT completed cuff leak test per MD request with positive cuff leak noted/audible cough. RN aware. RT will continue to monitor.

## 2016-10-20 NOTE — Progress Notes (Signed)
[place] Progress Note Patient Name: Mandy Rhodes DOB: 02/13/1941 MRN: [id number]   Date of Service  10/20/2016  HPI/Events of Note  Patient extubated today. Pepcid ordered via tube. Home medications include Prilosec. Currently nothing by mouth.   eICU Interventions  1. DC oral Pepcid 2. Start Protonix IV daily & plan to switch to oral once taking medications by mouth      Intervention Category Minor Interventions: Routine modifications to care plan (e.g. PRN medications for pain, fever)  Tera Partridge 10/20/2016, 9:56 PM

## 2016-10-20 NOTE — Progress Notes (Signed)
Overall stable. Drain output minimal. Pain controlled.  Doing well. Continue antibiotics. Airway management and possible extubation per critical care

## 2016-10-20 NOTE — Progress Notes (Addendum)
Nutrition Follow-up  DOCUMENTATION CODES:   Non-severe (moderate) malnutrition in context of acute illness/injury  INTERVENTION:  Initiate TF via OGT with Vital AF 1.2 at 20 ml/hr and increase by 10 ml every 4 hours to goal rate of 40 ml/h (960 ml per day) and Prostat 30 ml TID to provide 1452 kcals, 117 gm protein, 778 ml free water daily.   NUTRITION DIAGNOSIS:   Inadequate oral intake related to poor appetite, inability to eat as evidenced by per patient/family report, NPO status.  Ongoing  GOAL:   Patient will meet greater than or equal to 90% of their needs  [employer]  MONITOR:   Skin, I & O's, Vent status (ability to restart TF)  REASON FOR ASSESSMENT:   Consult Enteral/tube feeding initiation and management  ASSESSMENT:   Pt with PMH for DM, CHF, CKD, and HTN. Pt admitted with SOB x 2 weeks and 30 pound weight loss. Recently treated with antibiotics as an outpatient for suspected bronchitis vs pneumonia. MD addressing severe CAP, esophageal perforation with exposed hardware, infected cervical hardware, possible osteomyelitis  Consult received for TF initiation/management. OGT in place. Pt awake and responsive on vent at time of RD visit. Per MD note, start trickle feeds to assess tolerance of OG tube feeds. May need NGT or PEG.   Patient is currently intubated on ventilator support MV: 7 L/min Temp (24hrs), Avg:98.6 F (37 C), Min:97.9 F (36.6 C), Max:99.1 F (37.3 C)  Propofol: none  Labs: elevated chloride, low calcium  Diet Order:   NPO  Skin:  Wound (see comment) (Stage II PU on left buttocks)  Last BM:  11/28   Height:   Ht Readings from Last 1 Encounters:  10/18/16 5\' 6"  (1.676 m)    Weight:   Wt Readings from Last 1 Encounters:  10/20/16 200 lb 13.4 oz (91.1 kg)  weight of 179 lbs (81.4 kg) from 10/13/16 used for calculations  Ideal Body Weight:  59.09 kg   BMI:  Body mass index is 28.9 kg/m.  Estimated Nutritional Needs:   Kcal:   1512  Protein:  115-130 grams  Fluid:  2 L/day  EDUCATION NEEDS:   No education needs identified at this time  Mandy Rhodes RD, CSP, LDN Inpatient Clinical Dietitian Pager: [phone] After Hours Pager: [phone]

## 2016-10-20 NOTE — Progress Notes (Signed)
PULMONARY / CRITICAL CARE MEDICINE   Name: Mandy Rhodes MRN: [id number] DOB: [birth year]    ADMISSION DATE:  10/12/2016 CONSULTATION DATE:  10/13/2016  REFERRING MD:  Dr. Roxanne Mins EDP  CHIEF COMPLAINT:  Shortness of breath  HISTORY OF PRESENT ILLNESS:  75 y.o. female with past medical history as below, which is significant for diabetes, CHF, chronic kidney disease, and hypertension. She presented to [facility] emergency department 10/12/2016 with complaints of shortness of breath and generalized weakness for about 2 weeks. She has additionally suffered a 30 pound weight loss over some recent period of time which is not entirely clear. Recently treated with antibiotics as an outpatient for suspected bronchitis vs pneumonia.Upon arrival to the emergency department she was on 4 L of supplemental O2 via nasal cannula and still had increased work of breathing.due to her somnolence ABG was ordered which showed  Hypercapnic respiratory failure and she was started on BiPAP. She was known to be a DO NOT RESUSCITATE, however,  Family has decided to proceed with intubation, which was done in the emergency department. During intubation a supraglottic mass was noted by emergency room provider which made intubation difficult. Laboratory evaluation significant forserum creatinine 1.8, calcium 10.5, troponin 0.53, WBC 12, glucose 137.PCCM asked to admit  SUBJECTIVE: ID saw pt No fevers Awake on vent  VITAL SIGNS: BP 113/71   Pulse 69   Temp 99.1 F (37.3 C)   Resp 14   Ht 5\' 6"  (1.676 m)   Wt 89.1 kg (196 lb 6.9 oz)   SpO2 100%   BMI 31.70 kg/m   HEMODYNAMICS:    VENTILATOR SETTINGS: Vent Mode: PRVC FiO2 (%):  [30 %] 30 % Set Rate:  [14 bmp] 14 bmp Vt Set:  [470 mL] 470 mL PEEP:  [5 cmH20] 5 cmH20 Pressure Support:  [10 cmH20] 10 cmH20 Plateau Pressure:  [18 cmH20-19 cmH20] 18 cmH20  INTAKE / OUTPUT: I/O last 3 completed shifts: In: 4230.8 [I.V.:3365.8; IV Piggyback:865] Out:  1280 [Urine:975; Drains:105; Blood:200]  PHYSICAL EXAMINATION: General:  On vent no distress Neuro:  Following commands. Grossly nonfocal HEENT:  Drain in neck, swelling slight reduced Cardiovascular:  s1 s2 RRR Pulmonary: clear Abdomen:  Soft.NT no r/g Musculoskeletal:  Min edema Skin:  Warm and dry. no rash on exposed skin.  LABS:  BMET  Recent Labs Lab 10/18/16 2206 10/19/16 0500 10/20/16 0427  NA 148* 145  145 138  K 3.3* 3.5  3.6 4.7  CL 118* 115*  116* 98*  CO2 24 24  23  33*  BUN 36* 36*  35* 25*  CREATININE 1.88* 1.85*  1.87* 0.76  GLUCOSE 121* 149*  147* 199*    Electrolytes  Recent Labs Lab 10/15/16 0645 10/16/16 0332 10/17/16 0445 10/18/16 0500 10/18/16 2206 10/19/16 0500 10/20/16 0427  CALCIUM 9.2 9.2 9.3 9.2 8.8* 8.7*  8.6* 8.0*  MG 2.4 2.4 2.2  --   --   --   --   PHOS 3.2 2.4* 2.6 3.4  --  2.9 3.4    CBC  Recent Labs Lab 10/16/16 0332 10/17/16 0445 10/19/16 0500  WBC 6.0 5.2 6.7  HGB 8.7* 8.7* 7.9*  HCT 26.6* 26.0* 23.9*  PLT 127* 108* 105*    Coag's No results for input(s): APTT, INR in the last 168 hours.  Sepsis Markers  Recent Labs Lab 10/13/16 2109 10/14/16 0404 10/15/16 0645  LATICACIDVEN 1.4  --   --   PROCALCITON  --  <0.10 <0.10  ABG  Recent Labs Lab 10/16/16 1205  PHART 7.429  PCO2ART 40.4  PO2ART 108    Liver Enzymes  Recent Labs Lab 10/18/16 0500 10/19/16 0500 10/20/16 0427  ALBUMIN 2.1* 1.7* 2.2*    Cardiac Enzymes  Recent Labs Lab 10/13/16 1033 10/13/16 1716  TROPONINI 0.87* 1.09*    Glucose  Recent Labs Lab 10/19/16 1141 10/19/16 1550 10/19/16 1956 10/19/16 2346 10/20/16 0321 10/20/16 0742  GLUCAP 159* 103* 113* 131* 99 104*    Imaging Dg Abd Portable 1v  Result Date: 10/19/2016 CLINICAL DATA:  Evaluate orogastric tube position. EXAM: PORTABLE ABDOMEN - 1 VIEW COMPARISON:  Abdominal radiograph of October 13, 2016 FINDINGS: The proximal port of the orogastric  tube lies just above the GE junction. The tip lies at the junction of the cardia and proximal body of the stomach. IMPRESSION: Advancement of the orogastric tube by a 10 cm is recommended to assure that the proximal port lies below the GE junction. Electronically Signed   By: David  Martinique M.D.   On: 10/19/2016 10:11    STUDIES:  CT CHEST/NECK/SOFT TISSUE W/O 11/24: IMPRESSION: 1. Fluid and debris throughout the oropharynx, hypopharynx, and supraglottic airway from intubation. Suboptimal evaluation of the mucosa due to airway opacification and lack of contrast. Follow-up contrast examination after extubation and/or direct visualization recommended for evaluation of the supraglottic mass. 2. No cervical lymphadenopathy identified. 3. Left lower lobe consolidation and nodular opacities throughout the lungs compatible with pneumonia. Small left pleural effusion. 4. Endotracheal tube 1 cm from carina, retraction recommended. 5. Enteric tube tip below the field of view in the stomach. 6. Moderate coronary artery calcifications. 7. Borderline main pulmonary artery may represent pulmonary artery hypertension. CT HEAD W/O 11/24:  No acute intracranial abnormality. Moderate brain parenchymal atrophy and chronic microvascular disease. TTE 11/24: LV normal in size with EF 30-35%. Moderate concentric LVH. Appearance consistent with infiltrative cardiomyopathy. Moderate diffuse hypokinesis without regional variation. Grade 2 diastolic dysfunction. LA moderately dilated & RA normal in size. RV normal in size and function. No aortic stenosis or regurgitation. Aortic root normal in size. Trivial mitral regurgitation without stenosis. No pulmonic regurgitation. Mild tricuspid regurgitation. No pericardial effusion. 11/28: Direct laryngoscopy, esophagoscopy: Laryngeal and pharyngeal edema.  2x2 cm area of exposed cervical esophageal fusion plate just behind cricopharyngeus. 20-30 mL frank pus released. No cancer  identified  MICROBIOLOGY: MRSA PCR 11/24:  Negative Blood Ctx x2 11/24 >>  Urine Legionella Ag 11/24 >> Urine Streptococcal Ag 11/24:  Negative Respiratory Viral Panel PCR 11/24 (Tracheal):  Negative Tracheal Aspirate Ctx 11/24 >> OR 1/29>>>  ANTIBIOTICS: Unasyn 11/24 >>  vanc 11/28>>>  SIGNIFICANT EVENTS: 11/24 - intubated, admitted to ICU. 11/28 Now back from surgery, C-plate perforated thru esophagus 11/29- OR for removal hardware   LINES/TUBES: OETT 7.5 11/23 >> OGT 11/23 >>out FOLEY 11/23 >> PIV  ASSESSMENT / PLAN:  INFECTIOUS A:   PNA, asp? Infected cervicle hardware likley oseto P:   Maintain ABX Would consider dc vanc after d.w ID Resend cultures from drain , as concern lost per ID  PULMONARY A: Acute Hypercarbic Respiratory Failure Aspiration PNA  H/O Tobacco Use - Cigarettes & chewing tobacco  P:   Weaning, cpap 5 ps5, goal 1 hr Assess leak testing Will need to determine OGT  Vs NGT prior Low threshold decadron  CARDIOVASCULAR A:  NSTEMI/Elevated Troponin I - S/P 300mg  Rectal ASA. SVT - Seen since admission. Borderline Hypotension - Likely due to hypovolemia.  Probable Infiltrative Cardiomyopathy - Seen  on TTE. Question amyloidosis.  H/O CHF H/O HTN H/o Hyperlipidemia  P:  Continuous Telemetry Monitoring Even balance goals to pos, was positive  RENAL A:   Acute Renal Failure Hypernatremia  H/O CKD Stage   P:   Am chem  D5W to 50, assessing repeat chem am may be error  GASTROINTESTINAL A:   Moderate Protein-Calorie Malnutrition H/O GERD OGT placed  esoph perf P:   NPO Gastric tube in place, sentinal hole does enter stomach, start feeds trickle to asses tolerance Consider peg If extubated will need OGT to NGT, would use IT for that NO role tpn  HEMATOLOGIC/ONCOLOGIC A:   Anemia - dilutional  P:  Cbc in am scd Ensure sub q hep  ENDOCRINE A:   H/O Diabetes Mellitus Type 2 - A1c 7.2.  P:   SSI per Sensitive  Algorithm Accu-Checks q4hr  NEUROLOGIC A:   Acute Encephalopathy resolved H/O Neuropathy - Secondary to diabetes.  P:   RASS goal: 0 fent prn WUA active Pt consult  FAMILY  - Updates: I updated the pt  - Inter-disciplinary family meet or Palliative Care meeting due by:  11/30   Ccm time 30 min   Lavon Paganini. Titus Mould, MD, [place] Pgr: [facility]

## 2016-10-21 ENCOUNTER — Inpatient Hospital Stay (HOSPITAL_COMMUNITY): Payer: Medicare Other

## 2016-10-21 DIAGNOSIS — J189 Pneumonia, unspecified organism: Secondary | ICD-10-CM

## 2016-10-21 LAB — CBC WITH DIFFERENTIAL/PLATELET
BASOS ABS: 0 10*3/uL (ref 0.0–0.1)
Basophils Relative: 0 %
EOS PCT: 2 %
Eosinophils Absolute: 0.2 10*3/uL (ref 0.0–0.7)
HCT: 25 % — ABNORMAL LOW (ref 36.0–46.0)
Hemoglobin: 8 g/dL — ABNORMAL LOW (ref 12.0–15.0)
LYMPHS PCT: 11 %
Lymphs Abs: 0.8 10*3/uL (ref 0.7–4.0)
MCH: 28.4 pg (ref 26.0–34.0)
MCHC: 32 g/dL (ref 30.0–36.0)
MCV: 88.7 fL (ref 78.0–100.0)
Monocytes Absolute: 0.9 10*3/uL (ref 0.1–1.0)
Monocytes Relative: 13 %
Neutro Abs: 5.3 10*3/uL (ref 1.7–7.7)
Neutrophils Relative %: 74 %
PLATELETS: 153 10*3/uL (ref 150–400)
RBC: 2.82 MIL/uL — AB (ref 3.87–5.11)
RDW: 16.5 % — ABNORMAL HIGH (ref 11.5–15.5)
WBC: 7.2 10*3/uL (ref 4.0–10.5)

## 2016-10-21 LAB — RENAL FUNCTION PANEL
ALBUMIN: 1.8 g/dL — AB (ref 3.5–5.0)
ANION GAP: 5 (ref 5–15)
BUN: 24 mg/dL — AB (ref 6–20)
CO2: 24 mmol/L (ref 22–32)
Calcium: 8.7 mg/dL — ABNORMAL LOW (ref 8.9–10.3)
Chloride: 112 mmol/L — ABNORMAL HIGH (ref 101–111)
Creatinine, Ser: 1.84 mg/dL — ABNORMAL HIGH (ref 0.44–1.00)
GFR, EST AFRICAN AMERICAN: 30 mL/min — AB (ref >60)
GFR, EST NON AFRICAN AMERICAN: 26 mL/min — AB (ref >60)
Glucose, Bld: 125 mg/dL — ABNORMAL HIGH (ref 65–99)
PHOSPHORUS: 3.4 mg/dL (ref 2.5–4.6)
POTASSIUM: 3.6 mmol/L (ref 3.5–5.1)
Sodium: 141 mmol/L (ref 135–145)

## 2016-10-21 LAB — GLUCOSE, CAPILLARY
GLUCOSE-CAPILLARY: 125 mg/dL — AB (ref 65–99)
Glucose-Capillary: 102 mg/dL — ABNORMAL HIGH (ref 65–99)
Glucose-Capillary: 134 mg/dL — ABNORMAL HIGH (ref 65–99)
Glucose-Capillary: 72 mg/dL (ref 65–99)
Glucose-Capillary: 81 mg/dL (ref 65–99)
Glucose-Capillary: 75 mg/dL (ref 65–99)

## 2016-10-21 LAB — PHOSPHORUS
Phosphorus: 3.4 mg/dL (ref 2.5–4.6)
Phosphorus: 3.3 mg/dL (ref 2.5–4.6)

## 2016-10-21 LAB — MAGNESIUM
MAGNESIUM: 2 mg/dL (ref 1.7–2.4)
Magnesium: 2.1 mg/dL (ref 1.7–2.4)

## 2016-10-21 NOTE — Progress Notes (Signed)
PULMONARY / CRITICAL CARE MEDICINE   Name: Mandy Rhodes MRN: [id number] DOB: [birth year]    ADMISSION DATE:  10/12/2016 CONSULTATION DATE:  10/13/2016  REFERRING MD:  Dr. Roxanne Mins EDP  CHIEF COMPLAINT:  Shortness of breath  HISTORY OF PRESENT ILLNESS:  75 y.o. female with past medical history as below, which is significant for diabetes, CHF, chronic kidney disease, and hypertension. She presented to [facility] emergency department 10/12/2016 with complaints of shortness of breath and generalized weakness for about 2 weeks. She has additionally suffered a 30 pound weight loss over some recent period of time which is not entirely clear. Recently treated with antibiotics as an outpatient for suspected bronchitis vs pneumonia.Upon arrival to the emergency department she was on 4 L of supplemental O2 via nasal cannula and still had increased work of breathing.due to her somnolence ABG was ordered which showed  Hypercapnic respiratory failure and she was started on BiPAP. She was known to be a DO NOT RESUSCITATE, however,  Family has decided to proceed with intubation, which was done in the emergency department. During intubation a supraglottic mass was noted by emergency room provider which made intubation difficult. Laboratory evaluation significant forserum creatinine 1.8, calcium 10.5, troponin 0.53, WBC 12, glucose 137.PCCM asked to admit  SUBJECTIVE: No events overnight  VITAL SIGNS: BP (!) 107/55   Pulse 73   Temp (!) 96.8 F (36 C)   Resp (!) 25   Ht 5\' 6"  (1.676 m)   Wt 196 lb 3.4 oz (89 kg)   SpO2 96%   BMI 31.67 kg/m   HEMODYNAMICS:    VENTILATOR SETTINGS: Vent Mode: PRVC FiO2 (%):  [30 %] 30 % Set Rate:  [14 bmp] 14 bmp Vt Set:  [470 mL] 470 mL PEEP:  [5 cmH20] 5 cmH20 Plateau Pressure:  [24 cmH20] 24 cmH20  INTAKE / OUTPUT: I/O last 3 completed shifts: In: 2451.3 [I.V.:1850; NG/GT:51.3; IV Piggyback:550] Out: [id number] [Urine:1260; Drains:43]  PHYSICAL  EXAMINATION: General:  Awake and alert Neuro:  Following commands. Speech clear. HEENT:  Drain in neck, swelling slight reduced Cardiovascular:  s1 s2 RRR Pulmonary: clear Abdomen:  Soft.NT no r/g Musculoskeletal:  Min edema Skin:  Warm and dry. no rash on exposed skin.  LABS:  BMET  Recent Labs Lab 10/20/16 0427 10/20/16 0923 10/21/16 0446  NA 138 142 141  K 4.7 3.8 3.6  CL 98* 113* 112*  CO2 33* 21* 24  BUN 25* 29* 24*  CREATININE 0.76 1.97* 1.84*  GLUCOSE 199* 122* 125*    Electrolytes  Recent Labs Lab 10/20/16 0427 10/20/16 0923 10/20/16 1217 10/20/16 1821 10/21/16 0446  CALCIUM 8.0* 8.7*  --   --  8.7*  MG  --   --  2.0 2.0 2.0  PHOS 3.4  --  3.0 3.4 3.4  3.4    CBC  Recent Labs Lab 10/17/16 0445 10/19/16 0500 10/21/16 0446  WBC 5.2 6.7 7.2  HGB 8.7* 7.9* 8.0*  HCT 26.0* 23.9* 25.0*  PLT 108* 105* 153    Coag's No results for input(s): APTT, INR in the last 168 hours.  Sepsis Markers  Recent Labs Lab 10/15/16 0645  PROCALCITON <0.10    ABG  Recent Labs Lab 10/16/16 1205  PHART 7.429  PCO2ART 40.4  PO2ART 108    Liver Enzymes  Recent Labs Lab 10/19/16 0500 10/20/16 0427 10/21/16 0446  ALBUMIN 1.7* 2.2* 1.8*    Cardiac Enzymes No results for input(s): TROPONINI, PROBNP in the last 168 hours.  Glucose  Recent Labs Lab 10/20/16 1201 10/20/16 1613 10/20/16 2006 10/20/16 2335 10/21/16 0349 10/21/16 0745  GLUCAP 130* 118* 116* 114* 125* 102*    Imaging No results found.  STUDIES:  CT CHEST/NECK/SOFT TISSUE W/O 11/24: IMPRESSION: 1. Fluid and debris throughout the oropharynx, hypopharynx, and supraglottic airway from intubation. Suboptimal evaluation of the mucosa due to airway opacification and lack of contrast. Follow-up contrast examination after extubation and/or direct visualization recommended for evaluation of the supraglottic mass. 2. No cervical lymphadenopathy identified. 3. Left lower lobe  consolidation and nodular opacities throughout the lungs compatible with pneumonia. Small left pleural effusion. 4. Endotracheal tube 1 cm from carina, retraction recommended. 5. Enteric tube tip below the field of view in the stomach. 6. Moderate coronary artery calcifications. 7. Borderline main pulmonary artery may represent pulmonary artery hypertension. CT HEAD W/O 11/24:  No acute intracranial abnormality. Moderate brain parenchymal atrophy and chronic microvascular disease. TTE 11/24: LV normal in size with EF 30-35%. Moderate concentric LVH. Appearance consistent with infiltrative cardiomyopathy. Moderate diffuse hypokinesis without regional variation. Grade 2 diastolic dysfunction. LA moderately dilated & RA normal in size. RV normal in size and function. No aortic stenosis or regurgitation. Aortic root normal in size. Trivial mitral regurgitation without stenosis. No pulmonic regurgitation. Mild tricuspid regurgitation. No pericardial effusion. 11/28: Direct laryngoscopy, esophagoscopy: Laryngeal and pharyngeal edema.  2x2 cm area of exposed cervical esophageal fusion plate just behind cricopharyngeus. 20-30 mL frank pus released. No cancer identified  MICROBIOLOGY: MRSA PCR 11/24:  Negative Blood Ctx x2 11/24 >>  Urine Legionella Ag 11/24 >> Urine Streptococcal Ag 11/24:  Negative Respiratory Viral Panel PCR 11/24 (Tracheal):  Negative Tracheal Aspirate Ctx 11/24 >> OR 1/29>>>  ANTIBIOTICS: Unasyn 11/24 >>  vanc 11/28>>>  SIGNIFICANT EVENTS: 11/24 - intubated, admitted to ICU. 11/28 Now back from surgery, C-plate perforated thru esophagus 11/29- OR for removal hardware   LINES/TUBES: OETT 7.5 11/23 >> OGT 11/23 >>out FOLEY 11/23 >> PIV  ASSESSMENT / PLAN:  INFECTIOUS A:   PNA, asp? Infected cervicle hardware likley oseto P:   Maintain ABX Would consider dc vanc after d.w ID Resend cultures from drain , as concern lost per ID  PULMONARY A: Acute Hypercarbic  Respiratory Failure Aspiration PNA  H/O Tobacco Use - Cigarettes & chewing tobacco  P:   Extubated 12/1 OGT removed due to vomiting overnight O2 as needed  CARDIOVASCULAR A:  NSTEMI/Elevated Troponin I - S/P 300mg  Rectal ASA. SVT - Seen since admission. Borderline Hypotension - Likely due to hypovolemia.  Probable Infiltrative Cardiomyopathy - Seen on TTE. Question amyloidosis.  H/O CHF H/O HTN H/o Hyperlipidemia  P:  Continuous Telemetry Monitoring Even balance goals to pos, was positive  RENAL Lab Results  Component Value Date   CREATININE 1.84 (H) 10/21/2016   CREATININE 1.97 (H) 10/20/2016   CREATININE 0.76 10/20/2016   CREATININE 1.55 (H) 10/03/2016   CREATININE 1.67 (H) 08/25/2016   CREATININE 1.72 (H) 08/11/2016   CREATININE 2.1 (H) 01/13/2015   CREATININE 1.5 (H) 12/30/2013   CREATININE 2.0 (H) 12/27/2012   A:   Acute Renal Failure Hypernatremia  H/O CKD Stage   P:   Am chem  D5W to 50, assessing repeat chem am may be error Replace electrolytes as indicated  GASTROINTESTINAL A:   Moderate Protein-Calorie Malnutrition H/O GERD OGT placed , now out with vomiting esoph perf P:   NPO Gastric tube out Consider peg or NGT for feeds ENT to evaluate for feed tube placement vs peg  HEMATOLOGIC/ONCOLOGIC A:    Recent Labs  10/19/16 0500 10/21/16 0446  HGB 7.9* 8.0*    Anemia - dilutional  P:  Cbc in am scd Ensure sub q hep  ENDOCRINE CBG (last 3)   Recent Labs  10/20/16 2335 10/21/16 0349 10/21/16 0745  GLUCAP 114* 125* 102*     A:   H/O Diabetes Mellitus Type 2 - A1c 7.2.  P:   SSI per Sensitive Algorithm Accu-Checks q4hr  NEUROLOGIC A:   Acute Encephalopathy resolved H/O Neuropathy - Secondary to diabetes.  P:   RASS goal: 0 fent prn WUA active Pt consult  FAMILY  - Updates: Pt updated  - Inter-disciplinary family meet or Palliative Care meeting due by:  11/30   Ccm time 30 min   Richardson Landry Minor ACNP Maryanna Shape PCCM Pager [phone] till 3 pm If no answer page [phone] 10/21/2016, 8:29 AM  Attending Note  75 year old female s/p esophageal rupture from cervical hardware who was extubated 12/1.  Patient is breathing but airway protection is concerning and there is no GI access.  On exam, patient is breathing but cough is very weak and transmitted upper airway sounds is noted in the chest.  I reviewed CXR myself, no acute disease noted.  I am concerned that we are unable to feed patient and concerned about her respiratory condition.  Will hold in the ICU.  Consult called to GI for placement of NGT for feeding.    The patient is critically ill with multiple organ systems failure and requires high complexity decision making for assessment and support, frequent evaluation and titration of therapies, application of advanced monitoring technologies and extensive interpretation of multiple databases.   Critical Care Time devoted to patient care services described in this note is  35  Minutes. This time reflects time of care of this signee Dr Jennet Maduro. This critical care time does not reflect procedure time, or teaching time or supervisory time of PA/NP/Med student/Med Resident etc but could involve care discussion time.  Rush Farmer, M.D. [facility] Pulmonary/Critical Care Medicine. Pager: [phone]. After hours pager: [phone].

## 2016-10-21 NOTE — Progress Notes (Signed)
INFECTIOUS DISEASE PROGRESS NOTE  ID: Mandy Rhodes is a 75 y.o. female with  Principal Problem:   Sepsis ([place]) Active Problems:   Essential hypertension   Type 2 diabetes mellitus with stage 3 chronic kidney disease, without long-term current use of insulin (HCC)   Chronic combined systolic and diastolic heart failure (HCC)   Non-ischemic cardiomyopathy (HCC)   Acute hypercapnic respiratory failure (HCC)   Acute kidney injury superimposed on CKD (HCC)   Pressure injury of skin   PAT (paroxysmal atrial tachycardia) (HCC)   Malnutrition of moderate degree   Laryngeal mass   Hyperosmolality and/or hypernatremia   Anemia   Hypokalemia   Demand ischemia ([place])   CAP (community acquired pneumonia)   Acute encephalopathy   Vertebral osteomyelitis ([place])  Subjective: Fatigued, but alert and interactive.   Abtx:  Anti-infectives    Start     Dose/Rate Route Frequency Ordered Stop   10/20/16 1800  Ampicillin-Sulbactam (UNASYN) 3 g in sodium chloride 0.9 % 100 mL IVPB     3 g 200 mL/hr over 30 Minutes Intravenous Every 12 hours 10/20/16 0756     10/20/16 1400  Ampicillin-Sulbactam (UNASYN) 3 g in sodium chloride 0.9 % 100 mL IVPB  Status:  Discontinued     3 g 200 mL/hr over 30 Minutes Intravenous Every 8 hours 10/20/16 0744 10/20/16 0756   10/18/16 1938  bacitracin 50,000 Units in sodium chloride irrigation 0.9 % 500 mL irrigation  Status:  Discontinued       As needed 10/18/16 1954 10/18/16 2059   10/17/16 1000  vancomycin (VANCOCIN) IVPB 1000 mg/200 mL premix     1,000 mg 200 mL/hr over 60 Minutes Intravenous Every 24 hours 10/17/16 0943     10/13/16 1700  Ampicillin-Sulbactam (UNASYN) 3 g in sodium chloride 0.9 % 100 mL IVPB  Status:  Discontinued     3 g 200 mL/hr over 30 Minutes Intravenous Every 12 hours 10/13/16 0714 10/20/16 0744   10/13/16 0300  Ampicillin-Sulbactam (UNASYN) 3 g in sodium chloride 0.9 % 100 mL IVPB  Status:  Discontinued     3 g 200 mL/hr over 30  Minutes Intravenous Every 8 hours 10/13/16 0246 10/13/16 0714      Medications:  Scheduled: . ampicillin-sulbactam (UNASYN) IV  3 g Intravenous Q12H  . heparin subcutaneous  5,000 Units Subcutaneous Q8H  . insulin aspart  0-9 Units Subcutaneous Q4H  . ipratropium  0.5 mg Nebulization Q6H  . levalbuterol  0.63 mg Nebulization Q6H  . mouth rinse  15 mL Mouth Rinse BID  . pantoprazole (PROTONIX) IV  40 mg Intravenous Q24H  . vancomycin  1,000 mg Intravenous Q24H    Objective: Vital signs in last 24 hours: Temp:  [96.8 F (36 C)-99.1 F (37.3 C)] 98.4 F (36.9 C) (12/02 0900) Pulse Rate:  [61-113] 82 (12/02 1004) Resp:  [6-32] 7 (12/02 1000) BP: (86-144)/(43-125) 126/80 (12/02 1004) SpO2:  [95 %-100 %] 96 % (12/02 1004) FiO2 (%):  [30 %] 30 % (12/01 1405) Weight:  [89 kg (196 lb 3.4 oz)] 89 kg (196 lb 3.4 oz) (12/02 0400)   General appearance: fatigued Neck: neck wound is clean, drain in place.  Resp: rhonchi anterior - bilateral Cardio: regular rate and rhythm GI: normal findings: bowel sounds normal and soft, non-tender  Lab Results  Recent Labs  10/19/16 0500  10/20/16 0923 10/21/16 0446  WBC 6.7  --   --  7.2  HGB 7.9*  --   --  8.0*  HCT 23.9*  --   --  25.0*  NA 145  145  < > 142 141  K 3.5  3.6  < > 3.8 3.6  CL 115*  116*  < > 113* 112*  CO2 24  23  < > 21* 24  BUN 36*  35*  < > 29* 24*  CREATININE 1.85*  1.87*  < > 1.97* 1.84*  < > = values in this interval not displayed. Liver Panel  Recent Labs  10/20/16 0427 10/21/16 0446  ALBUMIN 2.2* 1.8*   Sedimentation Rate No results for input(s): ESRSEDRATE in the last 72 hours. C-Reactive Protein No results for input(s): CRP in the last 72 hours.  Microbiology: Recent Results (from the past 240 hour(s))  MRSA PCR Screening     Status: None   Collection Time: 10/13/16  5:03 AM  Result Value Ref Range Status   MRSA by PCR NEGATIVE NEGATIVE Final    Comment:        The GeneXpert MRSA Assay  (FDA approved for NASAL specimens only), is one component of a comprehensive MRSA colonization surveillance program. It is not intended to diagnose MRSA infection nor to guide or monitor treatment for MRSA infections.   Culture, blood (routine x 2)     Status: None   Collection Time: 10/13/16  5:34 AM  Result Value Ref Range Status   Specimen Description BLOOD LEFT ANTECUBITAL  Final   Special Requests BOTTLES DRAWN AEROBIC ONLY 5CC  Final   Culture   Final    NO GROWTH 5 DAYS Performed at [facility]    Report Status 10/18/2016 FINAL  Final  Culture, blood (routine x 2)     Status: None   Collection Time: 10/13/16  5:34 AM  Result Value Ref Range Status   Specimen Description BLOOD LEFT ARM  Final   Special Requests BOTTLES DRAWN AEROBIC ONLY 5CC  Final   Culture   Final    NO GROWTH 5 DAYS Performed at [facility]    Report Status 10/18/2016 FINAL  Final  Culture, respiratory (tracheal aspirate)     Status: None   Collection Time: 10/13/16 11:38 AM  Result Value Ref Range Status   Specimen Description TRACHEAL ASPIRATE  Final   Special Requests Normal  Final   Gram Stain   Final    ABUNDANT WBC PRESENT, PREDOMINANTLY PMN RARE SQUAMOUS EPITHELIAL CELLS PRESENT FEW GRAM POSITIVE COCCI IN PAIRS RARE GRAM NEGATIVE RODS RARE BUDDING YEAST SEEN    Culture   Final    Consistent with normal respiratory flora. Performed at [facility]    Report Status 10/16/2016 FINAL  Final  Respiratory Panel by PCR     Status: None   Collection Time: 10/13/16 11:59 AM  Result Value Ref Range Status   Adenovirus NOT DETECTED NOT DETECTED Final   Coronavirus 229E NOT DETECTED NOT DETECTED Final   Coronavirus HKU1 NOT DETECTED NOT DETECTED Final   Coronavirus NL63 NOT DETECTED NOT DETECTED Final   Coronavirus OC43 NOT DETECTED NOT DETECTED Final   Metapneumovirus NOT DETECTED NOT DETECTED Final   Rhinovirus / Enterovirus NOT DETECTED NOT DETECTED Final    Influenza A NOT DETECTED NOT DETECTED Final   Influenza B NOT DETECTED NOT DETECTED Final   Parainfluenza Virus 1 NOT DETECTED NOT DETECTED Final   Parainfluenza Virus 2 NOT DETECTED NOT DETECTED Final   Parainfluenza Virus 3 NOT DETECTED NOT DETECTED Final   Parainfluenza Virus 4  NOT DETECTED NOT DETECTED Final   Respiratory Syncytial Virus NOT DETECTED NOT DETECTED Final   Bordetella pertussis NOT DETECTED NOT DETECTED Final   Chlamydophila pneumoniae NOT DETECTED NOT DETECTED Final   Mycoplasma pneumoniae NOT DETECTED NOT DETECTED Final    Comment: Performed at [facility]  Aerobic/Anaerobic Culture (surgical/deep wound)     Status: None (Preliminary result)   Collection Time: 10/20/16 10:28 AM  Result Value Ref Range Status   Specimen Description JP DRAINAGE NECK  Final   Special Requests Normal  Final   Gram Stain   Final    RARE WBC PRESENT, PREDOMINANTLY PMN NO ORGANISMS SEEN    Culture PENDING  Incomplete   Report Status PENDING  Incomplete    Studies/Results: No results found.   Assessment/Plan: Retropharyngeal Abscess Cervical plate (removed QA348G)   Cx sent from drain today (not OR). Will continue current anbx for now while awaiting Cx WBC better, afebrile  Extubated Agree with topping vanco, this was a community acquired infection and staph has not been found in Cx.   CKD Elevated but slightly better Due to vanco?  DM Glc well controlled.   Total days of antibiotics:  Day 8 unsayn Day 4 vanco         Bobby Rumpf Infectious Diseases (pager) [phone] [email or web address] 10/21/2016, 10:36 AM  LOS: 8 days

## 2016-10-21 NOTE — Progress Notes (Signed)
[place] Progress Note Patient Name: Mandy Rhodes DOB: 1940-12-23 MRN: [id number]   Date of Service  10/21/2016  HPI/Events of Note  Camera check on patient post extubation. Saturating normally on room air. Laying on her back sleeping. No distress.   eICU Interventions  Continue close ICU monitoring.      Intervention Category Major Interventions: Respiratory failure - evaluation and management  Tera Partridge 10/21/2016, 12:20 AM

## 2016-10-21 NOTE — Progress Notes (Signed)
No acute events Minimal drain output Continue abx Keep drain for now

## 2016-10-21 NOTE — Evaluation (Signed)
Physical Therapy Evaluation Patient Details Name: Mandy Rhodes MRN: [id number] DOB: Apr 29, 1941 Today's Date: 10/21/2016   History of Present Illness  75 yo admitted with SOB and weakness, sepsis due to esophageal abscess with perforation. Pt with NSTEMI, VDRF with ETT 11/23-12/1, ACDF hardware removal with I&D. PMHx: DM, CHF, CKD, HTN  Clinical Impression  Pt pleasant and wanting to get OOB but lacks awareness of her current deficits and decreased strength/function. Pt max assist for pivot to EOB and back with 2 person assist to scoot up in bed. Pt repeatedly stating she "can do it" throughout session despite max assist provided but unable to perform when attempting on her own. Pt with limited function  Strength and mobility due to prolonged bed rest, intubation, and medical complexity who will benefit from acute therapy to maximize mobility, strength and transfers to decrease burden of care.   HR 76-82, sats 96% bP 105/66 pre 126/80 post    Follow Up Recommendations SNF;Supervision/Assistance - 24 hour    Equipment Recommendations  Other (comment) (will defer to next venue)    Recommendations for Other Services OT consult     Precautions / Restrictions Precautions Precautions: Fall;Cervical      Mobility  Bed Mobility Overal bed mobility: Needs Assistance Bed Mobility: Supine to Sit;Sit to Supine     Supine to sit: Max assist;HOB elevated Sit to supine: Max assist   General bed mobility comments: Pt with limited ability to bend knees, unable to initiate moving legs to EOB and was Max assist with multimodal cues to pivot to EOB and elevate trunk. Pt maintained posterior lean with any elevation of trunk off of bed. Pt repeatedly stating "wait a minute" . Pt stated she could sit on her own but when attempted could not lift trunk. 3 supine>sit trials with legs off of bed each with max assist  Transfers                 General transfer comment:  unable  Ambulation/Gait                Stairs            Wheelchair Mobility    Modified Rankin (Stroke Patients Only)       Balance Overall balance assessment: Needs assistance   Sitting balance-Leahy Scale: Poor   Postural control: Posterior lean                                   Pertinent Vitals/Pain Pain Assessment: No/denies pain    Home Living Family/patient expects to be discharged to:: Private residence Living Arrangements: Alone Available Help at Discharge: Family;Available PRN/intermittently Type of Home: House Home Access: Stairs to enter   Entrance Stairs-Number of Steps: 3 Home Layout: One level Home Equipment: Walker - 2 wheels;Shower seat;Bedside commode      Prior Function Level of Independence: Independent with assistive device(s)         Comments: uses RW at baseline, BSC is over toilet and uses shower chair. Still drives and does her own homemaking     Hand Dominance        Extremity/Trunk Assessment   Upper Extremity Assessment: Generalized weakness;RUE deficits/detail RUE Deficits / Details: decreased shoulder ROM and strength which pt reports was present PTA         Lower Extremity Assessment: Generalized weakness         Communication   Communication: No  difficulties  Cognition Arousal/Alertness: Awake/alert Behavior During Therapy: Flat affect Overall Cognitive Status: Impaired/Different from baseline Area of Impairment: Orientation;Memory;Safety/judgement;Following commands Orientation Level: Disoriented to;Time   Memory: Decreased short-term memory Following Commands: Follows one step commands with increased time Safety/Judgement: Decreased awareness of deficits          General Comments      Exercises     Assessment/Plan    PT Assessment Patient needs continued PT services  PT Problem List Decreased strength;Decreased mobility;Decreased safety awareness;Decreased range of  motion;Decreased activity tolerance;Decreased cognition;Decreased knowledge of use of DME;Decreased balance          PT Treatment Interventions DME instruction;Therapeutic activities;Gait training;Therapeutic exercise;Patient/family education;Cognitive remediation;Balance training;Functional mobility training    PT Goals (Current goals can be found in the Care Plan section)  Acute Rehab PT Goals Patient Stated Goal: walk PT Goal Formulation: With patient Time For Goal Achievement: 11/04/16 Potential to Achieve Goals: Fair    Frequency Min 3X/week   Barriers to discharge Decreased caregiver support;Inaccessible home environment      Co-evaluation               End of Session   Activity Tolerance: Patient tolerated treatment well Patient left: in bed;with call bell/phone within reach;with nursing/sitter in room Nurse Communication: Mobility status;Precautions         Time: [id number] PT Time Calculation (min) (ACUTE ONLY): 25 min   Charges:   PT Evaluation $PT Eval Moderate Complexity: 1 Procedure PT Treatments $Therapeutic Activity: 8-22 mins   PT G Codes:        Janasha Barkalow B Whittney Steenson 2016-11-05, 10:14 AM  Elwyn Reach, [place]

## 2016-10-22 DIAGNOSIS — R633 Feeding difficulties: Secondary | ICD-10-CM

## 2016-10-22 DIAGNOSIS — K223 Perforation of esophagus: Secondary | ICD-10-CM

## 2016-10-22 LAB — GLUCOSE, CAPILLARY
GLUCOSE-CAPILLARY: 117 mg/dL — AB (ref 65–99)
GLUCOSE-CAPILLARY: 83 mg/dL (ref 65–99)
GLUCOSE-CAPILLARY: 87 mg/dL (ref 65–99)
GLUCOSE-CAPILLARY: 99 mg/dL (ref 65–99)
Glucose-Capillary: 83 mg/dL (ref 65–99)
Glucose-Capillary: 90 mg/dL (ref 65–99)
Glucose-Capillary: 97 mg/dL (ref 65–99)

## 2016-10-22 LAB — CBC
HCT: 25.6 % — ABNORMAL LOW (ref 36.0–46.0)
HEMOGLOBIN: 8.2 g/dL — AB (ref 12.0–15.0)
MCH: 28.6 pg (ref 26.0–34.0)
MCHC: 32 g/dL (ref 30.0–36.0)
MCV: 89.2 fL (ref 78.0–100.0)
Platelets: 168 10*3/uL (ref 150–400)
RBC: 2.87 MIL/uL — AB (ref 3.87–5.11)
RDW: 16.9 % — ABNORMAL HIGH (ref 11.5–15.5)
WBC: 4.9 10*3/uL (ref 4.0–10.5)

## 2016-10-22 LAB — RENAL FUNCTION PANEL
ANION GAP: 9 (ref 5–15)
Albumin: 1.7 g/dL — ABNORMAL LOW (ref 3.5–5.0)
BUN: 20 mg/dL (ref 6–20)
CALCIUM: 8.8 mg/dL — AB (ref 8.9–10.3)
CHLORIDE: 110 mmol/L (ref 101–111)
CO2: 23 mmol/L (ref 22–32)
Creatinine, Ser: 1.71 mg/dL — ABNORMAL HIGH (ref 0.44–1.00)
GFR calc non Af Amer: 28 mL/min — ABNORMAL LOW (ref >60)
GFR, EST AFRICAN AMERICAN: 33 mL/min — AB (ref >60)
Glucose, Bld: 97 mg/dL (ref 65–99)
Phosphorus: 3.5 mg/dL (ref 2.5–4.6)
Potassium: 3.4 mmol/L — ABNORMAL LOW (ref 3.5–5.1)
Sodium: 142 mmol/L (ref 135–145)

## 2016-10-22 MED ORDER — CHLORHEXIDINE GLUCONATE 0.12 % MT SOLN
15.0000 mL | Freq: Two times a day (BID) | OROMUCOSAL | Status: DC
  Administered 2016-10-22 – 2016-10-27 (×10): 15 mL via OROMUCOSAL
  Filled 2016-10-22 (×7): qty 15

## 2016-10-22 MED ORDER — ORAL CARE MOUTH RINSE
15.0000 mL | Freq: Two times a day (BID) | OROMUCOSAL | Status: DC
  Administered 2016-10-23 – 2016-10-26 (×7): 15 mL via OROMUCOSAL

## 2016-10-22 NOTE — Progress Notes (Signed)
PULMONARY / CRITICAL CARE MEDICINE   Name: Mandy Rhodes MRN: [id number] DOB: 1941/09/22    ADMISSION DATE:  10/12/2016 CONSULTATION DATE:  10/13/2016  REFERRING MD:  Dr. Roxanne Mins EDP  CHIEF COMPLAINT:  Shortness of breath  HISTORY OF PRESENT ILLNESS:  75 y.o. female with past medical history as below, which is significant for diabetes, CHF, chronic kidney disease, and hypertension. She presented to [facility] emergency department 10/12/2016 with complaints of shortness of breath and generalized weakness for about 2 weeks. She has additionally suffered a 30 pound weight loss over some recent period of time which is not entirely clear. Recently treated with antibiotics as an outpatient for suspected bronchitis vs pneumonia.Upon arrival to the emergency department she was on 4 L of supplemental O2 via nasal cannula and still had increased work of breathing.due to her somnolence ABG was ordered which showed  Hypercapnic respiratory failure and she was started on BiPAP. She was known to be a DO NOT RESUSCITATE, however,  Family has decided to proceed with intubation, which was done in the emergency department. During intubation a supraglottic mass was noted by emergency room provider which made intubation difficult. Laboratory evaluation significant forserum creatinine 1.8, calcium 10.5, troponin 0.53, WBC 12, glucose 137.PCCM asked to admit  SUBJECTIVE: No events overnight  VITAL SIGNS: BP 97/60   Pulse 71   Temp 97.7 F (36.5 C) (Oral)   Resp 15   Ht 5\' 6"  (1.676 m)   Wt 194 lb 14.2 oz (88.4 kg)   SpO2 100%   BMI 31.46 kg/m   HEMODYNAMICS:    VENTILATOR SETTINGS: FiO2 (%):  [97 %] 97 %  INTAKE / OUTPUT: I/O last 3 completed shifts: In: 2300 [I.V.:1800; IV Piggyback:500] Out: [id number] [Urine:925; Drains:40]  PHYSICAL EXAMINATION: General:  Awake and alert Neuro:  Following commands. Speech clear. HEENT:  Drain in neck gray drainage, swelling slight  reduced Cardiovascular:  s1 s2 RRR Pulmonary: clear Abdomen:  Soft.NT no r/g Musculoskeletal:  Min edema Skin:  Warm and dry. no rash on exposed skin.  LABS:  BMET  Recent Labs Lab 10/20/16 0923 10/21/16 0446 10/22/16 0434  NA 142 141 142  K 3.8 3.6 3.4*  CL 113* 112* 110  CO2 21* 24 23  BUN 29* 24* 20  CREATININE 1.97* 1.84* 1.71*  GLUCOSE 122* 125* 97    Electrolytes  Recent Labs Lab 10/20/16 0923  10/20/16 1821 10/21/16 0446 10/21/16 1625 10/22/16 0434  CALCIUM 8.7*  --   --  8.7*  --  8.8*  MG  --   < > 2.0 2.0 2.1  --   PHOS  --   < > 3.4 3.4  3.4 3.3 3.5  < > = values in this interval not displayed.  CBC  Recent Labs Lab 10/19/16 0500 10/21/16 0446 10/22/16 0434  WBC 6.7 7.2 4.9  HGB 7.9* 8.0* 8.2*  HCT 23.9* 25.0* 25.6*  PLT 105* 153 168    Coag's No results for input(s): APTT, INR in the last 168 hours.  Sepsis Markers No results for input(s): LATICACIDVEN, PROCALCITON, O2SATVEN in the last 168 hours.  ABG  Recent Labs Lab 10/16/16 1205  PHART 7.429  PCO2ART 40.4  PO2ART 108    Liver Enzymes  Recent Labs Lab 10/20/16 0427 10/21/16 0446 10/22/16 0434  ALBUMIN 2.2* 1.8* 1.7*    Cardiac Enzymes No results for input(s): TROPONINI, PROBNP in the last 168 hours.  Glucose  Recent Labs Lab 10/21/16 1541 10/21/16 1813 10/21/16 1954  10/21/16 2353 10/22/16 0410 10/22/16 0742  GLUCAP 72 81 75 83 90 97    Imaging No results found.  STUDIES:  CT CHEST/NECK/SOFT TISSUE W/O 11/24: IMPRESSION: 1. Fluid and debris throughout the oropharynx, hypopharynx, and supraglottic airway from intubation. Suboptimal evaluation of the mucosa due to airway opacification and lack of contrast. Follow-up contrast examination after extubation and/or direct visualization recommended for evaluation of the supraglottic mass. 2. No cervical lymphadenopathy identified. 3. Left lower lobe consolidation and nodular opacities throughout the lungs  compatible with pneumonia. Small left pleural effusion. 4. Endotracheal tube 1 cm from carina, retraction recommended. 5. Enteric tube tip below the field of view in the stomach. 6. Moderate coronary artery calcifications. 7. Borderline main pulmonary artery may represent pulmonary artery hypertension. CT HEAD W/O 11/24:  No acute intracranial abnormality. Moderate brain parenchymal atrophy and chronic microvascular disease. TTE 11/24: LV normal in size with EF 30-35%. Moderate concentric LVH. Appearance consistent with infiltrative cardiomyopathy. Moderate diffuse hypokinesis without regional variation. Grade 2 diastolic dysfunction. LA moderately dilated & RA normal in size. RV normal in size and function. No aortic stenosis or regurgitation. Aortic root normal in size. Trivial mitral regurgitation without stenosis. No pulmonic regurgitation. Mild tricuspid regurgitation. No pericardial effusion. 11/28: Direct laryngoscopy, esophagoscopy: Laryngeal and pharyngeal edema.  2x2 cm area of exposed cervical esophageal fusion plate just behind cricopharyngeus. 20-30 mL frank pus released. No cancer identified  MICROBIOLOGY: MRSA PCR 11/24:  Negative Blood Ctx x2 11/24 >>  Urine Legionella Ag 11/24 >> Urine Streptococcal Ag 11/24:  Negative Respiratory Viral Panel PCR 11/24 (Tracheal):  Negative Tracheal Aspirate Ctx 11/24 >> OR 1/29>>>  ANTIBIOTICS: Unasyn 11/24 >>  vanc 11/28>>>12/2  SIGNIFICANT EVENTS: 11/24 - intubated, admitted to ICU. 11/28 Now back from surgery, C-plate perforated thru esophagus 11/29- OR for removal hardware   LINES/TUBES: OETT 7.5 11/23 >>12/1 OGT 11/23 >>out FOLEY 11/23 >> PIV  ASSESSMENT / PLAN:  INFECTIOUS A:   PNA, asp? Infected cervicle hardware likley oseto P:   Maintain ABX Resend cultures from drain , as concern lost per ID  PULMONARY A: Acute Hypercarbic Respiratory Failure Aspiration PNA  H/O Tobacco Use - Cigarettes & chewing  tobacco  P:   Extubated 12/1 OGT removed due to vomiting  O2 as needed  CARDIOVASCULAR A:  NSTEMI/Elevated Troponin I - S/P 300mg  Rectal ASA. SVT - Seen since admission. Borderline Hypotension - Likely due to hypovolemia.  Probable Infiltrative Cardiomyopathy - Seen on TTE. Question amyloidosis.  H/O CHF H/O HTN H/o Hyperlipidemia  P:  Continuous Telemetry Monitoring Even balance goals to pos, was positive  RENAL Lab Results  Component Value Date   CREATININE 1.71 (H) 10/22/2016   CREATININE 1.84 (H) 10/21/2016   CREATININE 1.97 (H) 10/20/2016   CREATININE 1.55 (H) 10/03/2016   CREATININE 1.67 (H) 08/25/2016   CREATININE 1.72 (H) 08/11/2016   CREATININE 2.1 (H) 01/13/2015   CREATININE 1.5 (H) 12/30/2013   CREATININE 2.0 (H) 12/27/2012    Recent Labs Lab 10/20/16 0923 10/21/16 0446 10/22/16 0434  K 3.8 3.6 3.4*     A:   Acute Renal Failure Hypernatremia  H/O CKD Stage   P:   Am chem  D5W to 50, assessing repeat chem am  Replace electrolytes as indicated  GASTROINTESTINAL A:   Moderate Protein-Calorie Malnutrition H/O GERD OGT placed , now out with vomiting esoph perf P:   NPO Gastric tube out Consider peg or NGT for feeds GI evaluation in progress    HEMATOLOGIC/ONCOLOGIC A:  Recent Labs  10/21/16 0446 10/22/16 0434  HGB 8.0* 8.2*    Anemia - dilutional  P:  Cbc in am scd  sub q hep  ENDOCRINE CBG (last 3)   Recent Labs  10/21/16 2353 10/22/16 0410 10/22/16 0742  GLUCAP 83 90 97     A:   H/O Diabetes Mellitus Type 2 - A1c 7.2.  P:   SSI per Sensitive Algorithm Accu-Checks q4hr  NEUROLOGIC A:   Acute Encephalopathy resolved H/O Neuropathy - Secondary to diabetes.  P:   RASS goal: 0-1 fent prn Pt consult  FAMILY  - Updates: Pt updated  - Inter-disciplinary family meet or Palliative Care meeting due by:  11/30   Ccm time 30 min   Richardson Landry Minor ACNP Maryanna Shape PCCM Pager [phone] till 3 pm If no  answer page [phone] 10/22/2016, 9:29 AM  Attending Note  75 year old female s/p esophageal rupture from cervical hardware who was extubated 12/1.  Patient is breathing but airway protection is concerning and there is no GI access.  On exam, patient is breathing but cough is very weak and transmitted upper airway sounds is noted in the chest.  She is more arousable however this AM.  I reviewed CXR myself, no acute disease noted.  I am concerned that we are unable to feed patient and concerned about her respiratory condition.  Will hold in the ICU.  GI consult called, do not recommend any instrumentation of the esophagus.  Will place order for IR to place a G-tube since GI or surgery would need to instrumentalize the esophagus to place.   The patient is critically ill with multiple organ systems failure and requires high complexity decision making for assessment and support, frequent evaluation and titration of therapies, application of advanced monitoring technologies and extensive interpretation of multiple databases.   Critical Care Time devoted to patient care services described in this note is  35  Minutes. This time reflects time of care of this signee Dr Jennet Maduro. This critical care time does not reflect procedure time, or teaching time or supervisory time of PA/NP/Med student/Med Resident etc but could involve care discussion time.  Rush Farmer, M.D. [facility] Pulmonary/Critical Care Medicine. Pager: [phone]. After hours pager: [phone].

## 2016-10-22 NOTE — Progress Notes (Signed)
No acute events Stable Minimal drain output D/c drain

## 2016-10-22 NOTE — Consult Note (Signed)
Referring Provider:   Critical Care Medicine Primary Care Physician:  Vidal Schwalbe, MD Primary Gastroenterologist: none/unassigned  Reason for Consultation:  Feeding difficulties  HPI: Mandy Rhodes is a 75 y.o. female with a remote history of cervical decompression. She was admitted several days ago with acute respiratory failure and upon intubation patient found to have a supraglottic mass. Ultimately determined that cervical hardware had eroded through the posterior wall of the upper esophagus. She underwent removal of the cervical hardware and I&D of preverterbral abscess on 11/29.    Past Medical History:  Diagnosis Date  . Anemia   . Arthritis   . CHF (congestive heart failure) ([place])   . Chronic kidney disease    Renal Insuffiency, see [facility] once a year  . Diabetes mellitus    type 2  . GERD (gastroesophageal reflux disease)   . History of rotator cuff tear    Torn right rotator cuff.  . History of spinal stenosis   . Hypertension   . Neuropathy associated with endocrine disorder [facility])     Past Surgical History:  Procedure Laterality Date  . ABDOMINAL HYSTERECTOMY  1971   Partial  . ANTERIOR CERVICAL DECOMP/DISCECTOMY FUSION N/A 10/18/2016   Procedure: debridement and drainage cervical prevertebral abscess  ANTERIOR CERVICAL HARDWARE REMOVAL exploration cervical fusion;  Surgeon: Earnie Larsson, MD;  Location: [place];  Service: Neurosurgery;  Laterality: N/A;  . APPENDECTOMY    . BACK SURGERY  2009   Disectomy  . CARDIAC CATHETERIZATION N/A 03/22/2016   Procedure: Left Heart Cath and Coronary Angiography;  Surgeon: Peter M Martinique, MD;  Location: [place] [facility];  Service: Cardiovascular;  Laterality: N/A;  . CHOLECYSTECTOMY  1969   Status post   . CYST REMOVAL TRUNK Right 12/03/2014   Procedure: EXCISION OF RIGHT BACK CYST ;  Surgeon: Coralie Keens, MD;  Location: [place];  Service: General;  Laterality: Right;  . DIRECT LARYNGOSCOPY N/A 10/17/2016   Procedure: DIRECT LARYNGOSCOPY,  ESOPHAGOSCOPY;  Surgeon: [name], MD;  Location: [facility];  Service: ENT;  Laterality: N/A;  . FOOT SURGERY Bilateral    bone removed  . NEUROPLASTY / TRANSPOSITION MEDIAN NERVE AT CARPAL TUNNEL Bilateral    Hx  Left carpal tunnel repair  . TRACHEOSTOMY TUBE PLACEMENT N/A 10/17/2016   Procedure: TRACHEOSTOMY;  Surgeon: [name], MD;  Location: [facility];  Service: ENT;  Laterality: N/A;    Prior to Admission medications   Medication Sig Start Date End Date Taking? Authorizing Provider  aspirin EC 81 MG EC tablet Take 1 tablet (81 mg total) by mouth daily. 03/23/16  Yes Shanker Kristeen Mans, MD  Calcium Carbonate-Vit D-Min (CALCIUM 1200 PO) Take 600 mg by mouth daily.    Yes Historical Provider, MD  captopril (CAPOTEN) 12.5 MG tablet Take 1 tablet (12.5 mg total) by mouth 2 (two) times daily. 08/31/16  Yes Pixie Casino, MD  cholecalciferol (VITAMIN D) 1000 UNITS tablet Take 1,000 Units by mouth daily. Reported on 03/27/2016   Yes Historical Provider, MD  docusate sodium (COLACE) 100 MG capsule Take 100 mg by mouth daily as needed for mild constipation.   Yes Historical Provider, MD  ferrous sulfate 325 (65 FE) MG EC tablet Take 325 mg by mouth daily.    Yes Historical Provider, MD  furosemide (LASIX) 40 MG tablet Take 1 tablet (40 mg total) by mouth daily. OK TO TAKE ONE ADDITIONAL TABLET AS NEEDED FOR WEIGHT GAIN Patient taking differently: Take 40 mg by  mouth every other day. OK TO TAKE ONE ADDITIONAL TABLET AS NEEDED FOR WEIGHT GAIN 09/04/16  Yes Rhonda G Barrett, PA-C  glimepiride (AMARYL) 1 MG tablet Take 1 mg by mouth daily with breakfast.  02/15/16  Yes Historical Provider, MD  Multiple Vitamin (MULTIVITAMIN) tablet Take 1 tablet by mouth daily.     Yes Historical Provider, MD  nortriptyline (PAMELOR) 10 MG capsule Take 10 mg by mouth at bedtime.   Yes Historical Provider, MD  omeprazole (PRILOSEC) 40 MG capsule Take 40 mg by mouth daily.     Yes  Historical Provider, MD  potassium chloride SA (K-DUR,KLOR-CON) 20 MEQ tablet Take 1 tablet (20 mEq total) by mouth daily. May take an extra tablet if extra furosemide is taken Patient taking differently: Take 20 mEq by mouth every other day. May take an extra tablet if extra furosemide is taken 09/04/16  Yes Pixie Casino, MD  pravastatin (PRAVACHOL) 40 MG tablet Take 40 mg by mouth daily.     Yes Historical Provider, MD  PRESCRIPTION MEDICATION 1 drop every 3 (three) months. Eye drops to be used the day before, of , and after eye injections   Yes Historical Provider, MD    Current Facility-Administered Medications  Medication Dose Route Frequency Provider Last Rate Last Dose  . 0.9 %  sodium chloride infusion  250 mL Intravenous PRN Rosebud, MD      . Ampicillin-Sulbactam (UNASYN) 3 g in sodium chloride 0.9 % 100 mL IVPB  3 g Intravenous Q12H Jose Shirl Harris, MD   3 g at 10/22/16 0525  . dextrose 5 % solution   Intravenous Continuous Raylene Miyamoto, MD 50 mL/hr at 10/22/16 0800    . fentaNYL (SUBLIMAZE) injection 25-50 mcg  25-50 mcg Intravenous Q1H PRN Javier Glazier, MD   50 mcg at 10/19/16 1347  . heparin injection 5,000 Units  5,000 Units Subcutaneous [id number] [name], MD   123456 Units at 10/22/16 0525  . insulin aspart (novoLOG) injection 0-9 Units  0-9 Units Subcutaneous Q4H Javier Glazier, MD   1 Units at 10/21/16 1228  . ipratropium (ATROVENT) nebulizer solution 0.5 mg  0.5 mg Nebulization Q6H Javier Glazier, MD   0.5 mg at 10/22/16 0804  . levalbuterol (XOPENEX) nebulizer solution 0.63 mg  0.63 mg Nebulization Q6H Javier Glazier, MD   0.63 mg at 10/22/16 0803  . MEDLINE mouth rinse  15 mL Mouth Rinse BID [place], MD   15 mL at 10/21/16 2200  . ondansetron (ZOFRAN) injection 4 mg  4 mg Intravenous Q8H PRN Mauri Brooklyn, MD   4 mg at 10/20/16 1548  . pantoprazole (PROTONIX) injection 40 mg  40 mg Intravenous Q24H Javier Glazier, MD    40 mg at 10/21/16 2107    Allergies as of 10/12/2016 - Review Complete 10/12/2016  Allergen Reaction Noted  . Sulfa antibiotics Swelling 09/01/2011    Family History  Problem Relation Age of Onset  . Diabetes type II Mother   . Hypertension Father   . Diabetes type II Sister   . Ovarian cancer Sister   . Diabetes type II Other   . Stroke Neg Hx   . CAD Neg Hx   . Heart failure Neg Hx     Social History   Social History  . Marital status: Divorced    Spouse name: N/A  . Number of children: N/A  . Years of education:  N/A   Occupational History  . Not on file.   Social History Main Topics  . Smoking status: Former Research scientist (life sciences)  . Smokeless tobacco: Never Used     Comment: 12/02/13- quit over 20 years ago  . Alcohol use No  . Drug use: No  . Sexual activity: Not on file   Other Topics Concern  . Not on file   Social History Narrative  . No narrative on file    Review of Systems: All systems reviewed and negative except where noted in HPI.  Physical Exam: Vital signs in last 24 hours: Temp:  [97.7 F (36.5 C)-98.5 F (36.9 C)] 97.7 F (36.5 C) (12/03 0411) Pulse Rate:  [68-118] 71 (12/03 0800) Resp:  [1-37] 15 (12/03 0800) BP: (86-128)/(44-80) 97/60 (12/03 0800) SpO2:  [95 %-100 %] 100 % (12/03 0803) FiO2 (%):  [97 %] 97 % (12/02 1942) Weight:  [194 lb 14.2 oz (88.4 kg)] 194 lb 14.2 oz (88.4 kg) (12/03 0430) Last BM Date: 10/21/16 General:   Alert, obese black female in NAD Head:  Normocephalic and atraumatic. Eyes:  Sclera clear, no icterus.   Conjunctiva pink. Ears:  Normal auditory acuity. Nose:  No deformity, discharge,  or lesions. Neck:  Supple; drain left neck draining blood tinged fluid. Lungs:  Clear throughout to auscultation.   No wheezes Heart:  Regular rate, rhythm is irregular. Abdomen:  Soft, obese nontender, BS active.    Rectal:  Deferred  Msk:  Symmetrical without gross deformities. . Neurologic:  Alert and  oriented x4;  grossly normal  neurologically. Skin:  Intact without significant lesions or rashes.. Psych:  Alert and cooperative. Normal mood and affect.   Intake/Output from previous day: 12/02 0701 - 12/03 0700 In: 1550 [I.V.:1150; IV Piggyback:400] Out: 580 [Urine:550; Drains:30] Intake/Output this shift: Total I/O In: 50 [I.V.:50] Out: -   Lab Results:  Recent Labs  10/21/16 0446 10/22/16 0434  WBC 7.2 4.9  HGB 8.0* 8.2*  HCT 25.0* 25.6*  PLT 153 168   BMET  Recent Labs  10/20/16 0923 10/21/16 0446 10/22/16 0434  NA 142 141 142  K 3.8 3.6 3.4*  CL 113* 112* 110  CO2 21* 24 23  GLUCOSE 122* 125* 97  BUN 29* 24* 20  CREATININE 1.97* 1.84* 1.71*  CALCIUM 8.7* 8.7* 8.8*   LFT  Recent Labs  10/22/16 0434  ALBUMIN 1.7*    Studies/Results: Dg Chest Port 1 View  Result Date: 10/21/2016 CLINICAL DATA:  Respiratory failure with fever. EXAM: PORTABLE CHEST 1 VIEW COMPARISON:  October 19, 2016 FINDINGS: Cardiomegaly and pulmonary venous congestion. Poor evaluation of the right upper lobe. A nodular density in this region is felt the likely be artifactual was not present previously. No other interval changes or acute abnormalities. IMPRESSION: Limited study with cardiomegaly and pulmonary venous congestion. Probable artifactual nodular density in the right upper lobe. Recommend PA and lateral chest x-ray before discharge. Electronically Signed   By: Dorise Bullion III M.D   On: 10/21/2016 11:19    IMPRESSION / PLAN:   1. 75 yo female with esophageal perforation secondary to erosion of anterior cervical hardware erosion into esophagus. She is s/p removal of hardware, I&D of abscess on 11/29.   2. Protein calorie malnutrition. She is in need of longterm route for nutrition. We were asked to see regarding PEG placement.  -Hesitant to instrument her esophagus with recent perforation. Recommend TNA for now or surgical evaluation for surgically placed G-tube.  Tye Savoy  10/22/2016, 8:54  AM  Pager number [phone]    Attending physician's note   I have taken a history, examined the patient and reviewed the chart. I agree with the Advanced Practitioner's note, impression and recommendations. Asked to evaluate for feeding difficulties. Cervical esophageal perforation due to cervical spine hardware and prevertebral abscess s/p removal of cervical hardware, I&D of abscess and drain placement on 11/29. Recommend avoiding esophageal instrumentation with recent esophageal perforation. Recommend TNA or surgically placed G -tube. GI signing off.    Lucio Edward, MD [facility] [phone] Mon-Fri 8a-5p [phone] after 5p, weekends, holidays

## 2016-10-22 NOTE — Progress Notes (Signed)
Pharmacy Antibiotic Note  Mandy Rhodes is a 75 y.o. female with neck abscess. She continues on unasyn. She is afebrile and WBC is WNL. Scr is improving slightly.   Plan: - Continue Unasyn 3gm IV q12h - F/u renal fxn, C&S, clinical status and trough at SS  Height: 5\' 6"  (167.6 cm) Weight: 194 lb 14.2 oz (88.4 kg) IBW/kg (Calculated) : 59.3  Temp (24hrs), Avg:98.1 F (36.7 C), Min:97.7 F (36.5 C), Max:98.5 F (36.9 C)   Recent Labs Lab 10/16/16 0332 10/17/16 0445  10/19/16 0500 10/20/16 0427 10/20/16 0923 10/21/16 0446 10/22/16 0434  WBC 6.0 5.2  --  6.7  --   --  7.2 4.9  CREATININE 2.12* 1.94*  < > 1.85*  1.87* 0.76 1.97* 1.84* 1.71*  < > = values in this interval not displayed.  Estimated Creatinine Clearance: 31.8 mL/min (by C-G formula based on SCr of 1.71 mg/dL (H)).    Allergies  Allergen Reactions  . Sulfa Antibiotics Swelling    11/24 Unasyn >> 11/28 vanc>>12/2  11/24 Blood Cx x2:NGF 11/24 Urine Legionella Ag: neg 11/24 Urine Streptococcal Ag: neg 11/24 Respiratory Viral Panel PCR (Tracheal): neg 11/24 Tracheal Aspirate Cx: few GPC, GNR, rare budding yeast F 11/24 MRSA PCR: Negative 12/1 surgical/deep wound cx>>NGTD  Salome Arnt, PharmD, BCPS Pager # [phone] 10/22/2016 9:02 AM

## 2016-10-23 LAB — CBC
HCT: 25.3 % — ABNORMAL LOW (ref 36.0–46.0)
Hemoglobin: 8 g/dL — ABNORMAL LOW (ref 12.0–15.0)
MCH: 28.2 pg (ref 26.0–34.0)
MCHC: 31.6 g/dL (ref 30.0–36.0)
MCV: 89.1 fL (ref 78.0–100.0)
PLATELETS: 225 10*3/uL (ref 150–400)
RBC: 2.84 MIL/uL — AB (ref 3.87–5.11)
RDW: 16.7 % — ABNORMAL HIGH (ref 11.5–15.5)
WBC: 4.6 10*3/uL (ref 4.0–10.5)

## 2016-10-23 LAB — RENAL FUNCTION PANEL
Albumin: 1.8 g/dL — ABNORMAL LOW (ref 3.5–5.0)
Anion gap: 7 (ref 5–15)
BUN: 17 mg/dL (ref 6–20)
CALCIUM: 8.5 mg/dL — AB (ref 8.9–10.3)
CO2: 24 mmol/L (ref 22–32)
CREATININE: 1.59 mg/dL — AB (ref 0.44–1.00)
Chloride: 108 mmol/L (ref 101–111)
GFR, EST AFRICAN AMERICAN: 36 mL/min — AB (ref >60)
GFR, EST NON AFRICAN AMERICAN: 31 mL/min — AB (ref >60)
Glucose, Bld: 98 mg/dL (ref 65–99)
PHOSPHORUS: 3.1 mg/dL (ref 2.5–4.6)
Potassium: 3.5 mmol/L (ref 3.5–5.1)
SODIUM: 139 mmol/L (ref 135–145)

## 2016-10-23 LAB — GLUCOSE, CAPILLARY
GLUCOSE-CAPILLARY: 58 mg/dL — AB (ref 65–99)
GLUCOSE-CAPILLARY: 81 mg/dL (ref 65–99)
GLUCOSE-CAPILLARY: 91 mg/dL (ref 65–99)
Glucose-Capillary: 96 mg/dL (ref 65–99)
Glucose-Capillary: 100 mg/dL — ABNORMAL HIGH (ref 65–99)
Glucose-Capillary: 59 mg/dL — ABNORMAL LOW (ref 65–99)

## 2016-10-23 LAB — POCT I-STAT 7, (LYTES, BLD GAS, ICA,H+H)
BICARBONATE: 24.3 mmol/L (ref 20.0–28.0)
Calcium, Ion: 1.19 mmol/L (ref 1.15–1.40)
HCT: 22 % — ABNORMAL LOW (ref 36.0–46.0)
Hemoglobin: 7.5 g/dL — ABNORMAL LOW (ref 12.0–15.0)
O2 SAT: 100 %
PH ART: 7.416 (ref 7.350–7.450)
PO2 ART: 326 mmHg — AB (ref 83.0–108.0)
Patient temperature: 36.4
Potassium: 3.1 mmol/L — ABNORMAL LOW (ref 3.5–5.1)
SODIUM: 151 mmol/L — AB (ref 135–145)
TCO2: 25 mmol/L (ref 0–100)
pCO2 arterial: 37.5 mmHg (ref 32.0–48.0)

## 2016-10-23 MED ORDER — DEXTROSE 50 % IV SOLN
INTRAVENOUS | Status: AC
  Administered 2016-10-23
  Filled 2016-10-23: qty 50

## 2016-10-23 MED ORDER — SODIUM CHLORIDE 0.45 % IV SOLN
INTRAVENOUS | Status: DC
  Administered 2016-10-23: 11:00:00 via INTRAVENOUS

## 2016-10-23 MED ORDER — SODIUM CHLORIDE 0.9 % IV SOLN
3.0000 g | Freq: Three times a day (TID) | INTRAVENOUS | Status: DC
  Administered 2016-10-23 – 2016-10-27 (×12): 3 g via INTRAVENOUS
  Filled 2016-10-23 (×18): qty 3

## 2016-10-23 MED ORDER — DEXTROSE 50 % IV SOLN
INTRAVENOUS | Status: AC
  Administered 2016-10-23: 25 mL
  Filled 2016-10-23: qty 50

## 2016-10-23 MED ORDER — ONDANSETRON HCL 4 MG/2ML IJ SOLN: 4.0000 mg | Freq: Once | INTRAMUSCULAR | Status: DC | PRN

## 2016-10-23 MED ORDER — HYDROMORPHONE HCL 1 MG/ML IJ SOLN: 0.2500 mg | INTRAMUSCULAR | Status: DC | PRN

## 2016-10-23 MED ORDER — HEPARIN SODIUM (PORCINE) 5000 UNIT/ML IJ SOLN: 5000.0000 [IU] | Freq: Three times a day (TID) | INTRAMUSCULAR | Status: DC

## 2016-10-23 MED ORDER — MEPERIDINE HCL 25 MG/ML IJ SOLN
6.2500 mg | INTRAMUSCULAR | Status: DC | PRN
Start: 2016-10-23 — End: 2016-10-24

## 2016-10-23 NOTE — Consult Note (Signed)
Chief Complaint: Patient was seen in consultation today for percutaneous gastric tube placement Chief Complaint  Patient presents with  . Shortness of Breath   at the request of Dr Merrie Roof  Referring Physician(s): Dr Merrie Roof  Supervising Physician: Arne Cleveland  Patient Status: [facility] - In-pt  History of Present Illness: Mandy Rhodes is a 75 y.o. female   Admitted 10/12/16 with wt loss SOB; weakness Respiratory distress and intubated in ED Difficult intubation secondary laryngeal mass Later to have laryngeal mass surgery complicated by infection/perforation Now needs percutaneous gastric tube due to npo status for now.  Protein calorie malnutrition Dysphagia  Request per CCM Dr Titus Mould Imaging reviewed with Dr Valentina Shaggy procedure  Did discuss procedure with Dr Manning Charity feels comfortable with IR procedure---NG and pull through Recommends fluoroscopic guidance for all tubes Dr Vernard Gambles agreeable   Past Medical History:  Diagnosis Date  . Anemia   . Arthritis   . CHF (congestive heart failure) ([place])   . Chronic kidney disease    Renal Insuffiency, see [facility] once a year  . Diabetes mellitus    type 2  . GERD (gastroesophageal reflux disease)   . History of rotator cuff tear    Torn right rotator cuff.  . History of spinal stenosis   . Hypertension   . Neuropathy associated with endocrine disorder [facility])     Past Surgical History:  Procedure Laterality Date  . ABDOMINAL HYSTERECTOMY  1971   Partial  . ANTERIOR CERVICAL DECOMP/DISCECTOMY FUSION N/A 10/18/2016   Procedure: debridement and drainage cervical prevertebral abscess  ANTERIOR CERVICAL HARDWARE REMOVAL exploration cervical fusion;  Surgeon: Earnie Larsson, MD;  Location: [place];  Service: Neurosurgery;  Laterality: N/A;  . APPENDECTOMY    . BACK SURGERY  2009   Disectomy  . CARDIAC CATHETERIZATION N/A 03/22/2016   Procedure: Left Heart Cath and Coronary  Angiography;  Surgeon: Peter M Martinique, MD;  Location: [place] [facility];  Service: Cardiovascular;  Laterality: N/A;  . CHOLECYSTECTOMY  1969   Status post   . CYST REMOVAL TRUNK Right 12/03/2014   Procedure: EXCISION OF RIGHT BACK CYST ;  Surgeon: Coralie Keens, MD;  Location: [place];  Service: General;  Laterality: Right;  . DIRECT LARYNGOSCOPY N/A 10/17/2016   Procedure: DIRECT LARYNGOSCOPY,  ESOPHAGOSCOPY;  Surgeon: [name], MD;  Location: [facility];  Service: ENT;  Laterality: N/A;  . FOOT SURGERY Bilateral    bone removed  . NEUROPLASTY / TRANSPOSITION MEDIAN NERVE AT CARPAL TUNNEL Bilateral    Hx  Left carpal tunnel repair  . TRACHEOSTOMY TUBE PLACEMENT N/A 10/17/2016   Procedure: TRACHEOSTOMY;  Surgeon: [name], MD;  Location: [facility];  Service: ENT;  Laterality: N/A;    Allergies: Sulfa antibiotics  Medications: Prior to Admission medications   Medication Sig Start Date End Date Taking? Authorizing Provider  aspirin EC 81 MG EC tablet Take 1 tablet (81 mg total) by mouth daily. 03/23/16  Yes Shanker Kristeen Mans, MD  Calcium Carbonate-Vit D-Min (CALCIUM 1200 PO) Take 600 mg by mouth daily.    Yes Historical Provider, MD  captopril (CAPOTEN) 12.5 MG tablet Take 1 tablet (12.5 mg total) by mouth 2 (two) times daily. 08/31/16  Yes Pixie Casino, MD  cholecalciferol (VITAMIN D) 1000 UNITS tablet Take 1,000 Units by mouth daily. Reported on 03/27/2016   Yes Historical Provider, MD  docusate sodium (COLACE) 100 MG capsule Take 100 mg by mouth daily as needed for mild constipation.  Yes Historical Provider, MD  ferrous sulfate 325 (65 FE) MG EC tablet Take 325 mg by mouth daily.    Yes Historical Provider, MD  furosemide (LASIX) 40 MG tablet Take 1 tablet (40 mg total) by mouth daily. OK TO TAKE ONE ADDITIONAL TABLET AS NEEDED FOR WEIGHT GAIN Patient taking differently: Take 40 mg by mouth every other day. OK TO TAKE ONE ADDITIONAL TABLET AS NEEDED FOR WEIGHT GAIN 09/04/16  Yes  Rhonda G Barrett, PA-C  glimepiride (AMARYL) 1 MG tablet Take 1 mg by mouth daily with breakfast.  02/15/16  Yes Historical Provider, MD  Multiple Vitamin (MULTIVITAMIN) tablet Take 1 tablet by mouth daily.     Yes Historical Provider, MD  nortriptyline (PAMELOR) 10 MG capsule Take 10 mg by mouth at bedtime.   Yes Historical Provider, MD  omeprazole (PRILOSEC) 40 MG capsule Take 40 mg by mouth daily.     Yes Historical Provider, MD  potassium chloride SA (K-DUR,KLOR-CON) 20 MEQ tablet Take 1 tablet (20 mEq total) by mouth daily. May take an extra tablet if extra furosemide is taken Patient taking differently: Take 20 mEq by mouth every other day. May take an extra tablet if extra furosemide is taken 09/04/16  Yes Pixie Casino, MD  pravastatin (PRAVACHOL) 40 MG tablet Take 40 mg by mouth daily.     Yes Historical Provider, MD  PRESCRIPTION MEDICATION 1 drop every 3 (three) months. Eye drops to be used the day before, of , and after eye injections   Yes Historical Provider, MD     Family History  Problem Relation Age of Onset  . Diabetes type II Mother   . Hypertension Father   . Diabetes type II Sister   . Ovarian cancer Sister   . Diabetes type II Other   . Stroke Neg Hx   . CAD Neg Hx   . Heart failure Neg Hx     Social History   Social History  . Marital status: Divorced    Spouse name: N/A  . Number of children: N/A  . Years of education: N/A   Social History Main Topics  . Smoking status: Former Research scientist (life sciences)  . Smokeless tobacco: Never Used     Comment: 12/02/13- quit over 20 years ago  . Alcohol use No  . Drug use: No  . Sexual activity: Not Asked   Other Topics Concern  . None   Social History Narrative  . None    Review of Systems: A 12 point ROS discussed and pertinent positives are indicated in the HPI above.  All other systems are negative.  Review of Systems  Constitutional: Positive for activity change and appetite change. Negative for fever.  HENT: Negative  for drooling.   Respiratory: Negative for choking and shortness of breath.   Neurological: Positive for weakness.  Psychiatric/Behavioral: Negative for behavioral problems and confusion.    Vital Signs: BP 139/66   Pulse 97   Temp 97.5 F (36.4 C) (Oral)   Resp 13   Ht 5\' 6"  (1.676 m)   Wt 194 lb 3.6 oz (88.1 kg)   SpO2 99%   BMI 31.35 kg/m   Physical Exam  Constitutional: She is oriented to person, place, and time.  Neck: Neck supple.  Cardiovascular: Normal rate and regular rhythm.   Pulmonary/Chest: Effort normal.  Abdominal: Soft. Bowel sounds are normal.  Musculoskeletal: Normal range of motion.  Neurological: She is alert and oriented to person, place, and time.  Skin: Skin is  warm and dry.  Psychiatric: She has a normal mood and affect. Her behavior is normal.  Consented son Jori Moll via phone  Nursing note and vitals reviewed.   Mallampati Score:  MD Evaluation Airway: WNL Heart: WNL Abdomen: WNL Chest/ Lungs: WNL ASA  Classification: 3 Mallampati/Airway Score: One  Imaging: Dg Abd 1 View  Result Date: 10/13/2016 CLINICAL DATA:  Enteric tube placement. EXAM: ABDOMEN - 1 VIEW COMPARISON:  None. FINDINGS: Normal bowel gas pattern. Enteric tube tip projects over the mid stomach. Multilevel degenerative changes of the lumbar spine. Moderate bilateral hip osteoarthrosis. IMPRESSION: Enteric tube tip projects over mid stomach. Electronically Signed   By: Kristine Garbe M.D.   On: 10/13/2016 05:30   Ct Head Wo Contrast  Result Date: 10/13/2016 CLINICAL DATA:  Unresponsive patient. Patient complained of breath and generalized weakness for 2 weeks. EXAM: CT HEAD WITHOUT CONTRAST TECHNIQUE: Contiguous axial images were obtained from the base of the skull through the vertex without intravenous contrast. COMPARISON:  None. FINDINGS: Brain: No evidence of acute infarction, hemorrhage, hydrocephalus, extra-axial collection or mass lesion/mass effect. There is  moderate brain parenchymal volume loss and symmetric bilateral microangiopathy. Vascular: Atherosclerotic disease at the skullbase. Skull: Normal. Negative for fracture or focal lesion. Sinuses/Orbits: No acute finding. Other: None. IMPRESSION: No acute intracranial abnormality. Moderate brain parenchymal atrophy and chronic microvascular disease. Electronically Signed   By: Fidela Salisbury M.D.   On: 10/13/2016 12:35   Ct Soft Tissue Neck Wo Contrast  Addendum Date: 10/13/2016   ADDENDUM REPORT: 10/13/2016 04:53 ADDENDUM: These results were called by telephone at the time of interpretation on 10/13/2016 at 4:53 am to Dr. Jimmy Footman, who verbally acknowledged these results. Electronically Signed   By: Kristine Garbe M.D.   On: 10/13/2016 04:53   Result Date: 10/13/2016 CLINICAL DATA:  75 y/o F; shortness of breath and generalized weakness for 2 weeks. Evaluation of supraglottic mass seen on intubation. EXAM: CT NECK AND CHEST WITHOUT CONTRAST COMPARISON:  07/15/2015 CT of cervical spine. FINDINGS: CT NECK FINDINGS Pharynx and larynx: There is fluid and debris throughout the oropharynx, hypopharynx, and supraglottic airway due to intubation and nasoenteric tube. Addition, the region is obscured by artifact from cervical fusion hardware and the endotracheal tube. No soft tissue mass can be distinguished. Laryngeal cartilage appears intact. Subglottic airway is patent. Salivary glands: No inflammation, mass, or stone. Thyroid: Normal. Lymph nodes: None enlarged or abnormal density. Vascular: Calcific atherosclerosis of bilateral carotid bifurcations. Limited intracranial: Negative. Visualized orbits: Not within the field of view Mastoids and visualized paranasal sinuses: Normally pneumatized mastoid air cells and visualized sinuses. Skeleton: Anterior cervical fusion with plate and screws from C3 through C6. Multilevel uncovertebral and facet hypertrophy with severe bony foraminal narrowing through  the levels of fusion 1. C6-7 disc osteophyte complex with moderate to severe canal stenosis. C2-3 disc osteophyte complex with at least moderate canal stenosis. No acute osseous abnormality is identified. Flowing anterior ossification of upper thoracic spine may represent DISH. CT CHEST FINDINGS Cardiovascular: Mild cardiomegaly. No pericardial effusion. Moderate coronary artery calcification. Normal caliber thoracic aorta. Borderline enlarged main pulmonary artery. Mediastinum/Nodes: No enlarged mediastinal or axillary lymph nodes. Thyroid gland, trachea, and esophagus demonstrate no significant findings. Endotracheal tube is less than 1 cm from the carina. Lungs/Pleura: There is patchy nodular consolidation within the bilateral upper and right lower lobe and a dense consolidation within the left lower lobe with air bronchograms. Small left pleural effusion. Upper Abdomen: Enteric tube tip below the field of view  in the stomach. No acute process. Musculoskeletal: Flowing anterior ossification of vertebral bodies consistent with DISH. No acute osseous abnormality. IMPRESSION: 1. Fluid and debris throughout the oropharynx, hypopharynx, and supraglottic airway from intubation. Suboptimal evaluation of the mucosa due to airway opacification and lack of contrast. Follow-up contrast examination after extubation and/or direct visualization recommended for evaluation of the supraglottic mass. 2. No cervical lymphadenopathy identified. 3. Left lower lobe consolidation and nodular opacities throughout the lungs compatible with pneumonia. Small left pleural effusion. 4. Endotracheal tube 1 cm from carina, retraction recommended. 5. Enteric tube tip below the field of view in the stomach. 6. Moderate coronary artery calcifications. 7. Borderline main pulmonary artery may represent pulmonary artery hypertension. Electronically Signed: By: Kristine Garbe M.D. On: 10/13/2016 04:36   Ct Chest Wo Contrast  Addendum  Date: 10/13/2016   ADDENDUM REPORT: 10/13/2016 04:53 ADDENDUM: These results were called by telephone at the time of interpretation on 10/13/2016 at 4:53 am to Dr. Jimmy Footman, who verbally acknowledged these results. Electronically Signed   By: Kristine Garbe M.D.   On: 10/13/2016 04:53   Result Date: 10/13/2016 CLINICAL DATA:  75 y/o F; shortness of breath and generalized weakness for 2 weeks. Evaluation of supraglottic mass seen on intubation. EXAM: CT NECK AND CHEST WITHOUT CONTRAST COMPARISON:  07/15/2015 CT of cervical spine. FINDINGS: CT NECK FINDINGS Pharynx and larynx: There is fluid and debris throughout the oropharynx, hypopharynx, and supraglottic airway due to intubation and nasoenteric tube. Addition, the region is obscured by artifact from cervical fusion hardware and the endotracheal tube. No soft tissue mass can be distinguished. Laryngeal cartilage appears intact. Subglottic airway is patent. Salivary glands: No inflammation, mass, or stone. Thyroid: Normal. Lymph nodes: None enlarged or abnormal density. Vascular: Calcific atherosclerosis of bilateral carotid bifurcations. Limited intracranial: Negative. Visualized orbits: Not within the field of view Mastoids and visualized paranasal sinuses: Normally pneumatized mastoid air cells and visualized sinuses. Skeleton: Anterior cervical fusion with plate and screws from C3 through C6. Multilevel uncovertebral and facet hypertrophy with severe bony foraminal narrowing through the levels of fusion 1. C6-7 disc osteophyte complex with moderate to severe canal stenosis. C2-3 disc osteophyte complex with at least moderate canal stenosis. No acute osseous abnormality is identified. Flowing anterior ossification of upper thoracic spine may represent DISH. CT CHEST FINDINGS Cardiovascular: Mild cardiomegaly. No pericardial effusion. Moderate coronary artery calcification. Normal caliber thoracic aorta. Borderline enlarged main pulmonary artery.  Mediastinum/Nodes: No enlarged mediastinal or axillary lymph nodes. Thyroid gland, trachea, and esophagus demonstrate no significant findings. Endotracheal tube is less than 1 cm from the carina. Lungs/Pleura: There is patchy nodular consolidation within the bilateral upper and right lower lobe and a dense consolidation within the left lower lobe with air bronchograms. Small left pleural effusion. Upper Abdomen: Enteric tube tip below the field of view in the stomach. No acute process. Musculoskeletal: Flowing anterior ossification of vertebral bodies consistent with DISH. No acute osseous abnormality. IMPRESSION: 1. Fluid and debris throughout the oropharynx, hypopharynx, and supraglottic airway from intubation. Suboptimal evaluation of the mucosa due to airway opacification and lack of contrast. Follow-up contrast examination after extubation and/or direct visualization recommended for evaluation of the supraglottic mass. 2. No cervical lymphadenopathy identified. 3. Left lower lobe consolidation and nodular opacities throughout the lungs compatible with pneumonia. Small left pleural effusion. 4. Endotracheal tube 1 cm from carina, retraction recommended. 5. Enteric tube tip below the field of view in the stomach. 6. Moderate coronary artery calcifications. 7. Borderline main pulmonary artery  may represent pulmonary artery hypertension. Electronically Signed: By: Kristine Garbe M.D. On: 10/13/2016 04:36   US Biopsy  Result Date: 09/25/2016 INDICATION: 75 year old female with a question of amyloidosis EXAM: ULTRASOUND BIOPSY CORE LIVER MEDICATIONS: None. ANESTHESIA/SEDATION: Moderate (conscious) sedation was employed during this procedure. A total of Versed 1.0 mg and Fentanyl 50 mcg was administered intravenously. Moderate Sedation Time: 11 minutes. The patient's level of consciousness and vital signs were monitored continuously by radiology nursing throughout the procedure under my direct  supervision. FLUOROSCOPY TIME:  None COMPLICATIONS: None PROCEDURE: Informed written consent was obtained from the patient and the patient's family after a thorough discussion of the procedural risks, benefits and alternatives. All questions were addressed. Maximal Sterile Barrier Technique was utilized including caps, mask, sterile gowns, sterile gloves, sterile drape, hand hygiene and skin antiseptic. A timeout was performed prior to the initiation of the procedure. Ultrasound survey of the right abdomen was performed with images stored and sent to PACs. The patient is then prepped and draped in the usual sterile fashion. The skin and subcutaneous tissues were generously infiltrated 1% lidocaine for local anesthesia. Small stab incision was made with 11 blade scalpel. Using ultrasound guidance, an osteo site 11 gauge needle was advanced into the subcutaneous fat, observed under ultrasound to acquire multiple small adipose samples. Samples were placed into saline for transportation to the lab. Final ultrasound image was stored.  Sterile bandage was placed. Patient tolerated the procedure well and remained hemodynamically stable throughout. No complications were encountered and no significant blood loss encountered. IMPRESSION: Status post ultrasound-guided fat pad biopsy for evaluation for possible amyloidosis. Signed, Dulcy Fanny. Earleen Newport, DO [facility] Electronically Signed   By: Corrie Mckusick D.O.   On: 09/25/2016 15:32   Dg Chest Port 1 View  Result Date: 10/21/2016 CLINICAL DATA:  Respiratory failure with fever. EXAM: PORTABLE CHEST 1 VIEW COMPARISON:  October 19, 2016 FINDINGS: Cardiomegaly and pulmonary venous congestion. Poor evaluation of the right upper lobe. A nodular density in this region is felt the likely be artifactual was not present previously. No other interval changes or acute abnormalities. IMPRESSION: Limited study with cardiomegaly  and pulmonary venous congestion. Probable artifactual nodular density in the right upper lobe. Recommend PA and lateral chest x-ray before discharge. Electronically Signed   By: Dorise Bullion III M.D   On: 10/21/2016 11:19   Dg Chest Port 1 View  Result Date: 10/19/2016 CLINICAL DATA:  Check endotracheal tube placement EXAM: PORTABLE CHEST 1 VIEW COMPARISON:  10/16/2016 FINDINGS: Endotracheal tube and nasogastric catheter are noted in satisfactory position. Cardiac shadow remains enlarged. The lungs are clear bilaterally. No bony abnormality is seen. IMPRESSION: No acute abnormality noted. Electronically Signed   By: Inez Catalina M.D.   On: 10/19/2016 07:34   Dg Chest Port 1 View  Result Date: 10/16/2016 CLINICAL DATA:  Hypoxia EXAM: PORTABLE CHEST 1 VIEW COMPARISON:  October 16, 2016 FINDINGS: Endotracheal tube tip is 3.3 cm above the carina. No pneumothorax. No edema or consolidation. Heart is prominent with pulmonary vascularity within normal limits, stable. No adenopathy. There is extensive arthropathy in each shoulder. IMPRESSION: Endotracheal tube as described without pneumothorax. Stable cardiac prominence. No edema or consolidation. Electronically Signed   By: Lowella Grip III M.D.   On: 10/16/2016 15:12   Dg Chest Port 1 View  Result Date: 10/16/2016 CLINICAL DATA:  Pneumonia EXAM: PORTABLE CHEST 1 VIEW COMPARISON:  10/13/2016 FINDINGS: Endotracheal tube remains in place, unchanged. Interval removal  of NG tube. There is cardiomegaly. Improving aeration at the left base with mild residual atelectasis or infiltrate. No confluent opacity on the right. No effusions. IMPRESSION: Cardiomegaly. Improving left base aeration with mild residual atelectasis or infiltrate. Electronically Signed   By: Rolm Baptise M.D.   On: 10/16/2016 12:07   Dg Chest Port 1 View  Result Date: 10/13/2016 CLINICAL DATA:  75 y/o  F; endotracheal tube placement. EXAM: PORTABLE CHEST 1 VIEW COMPARISON:   10/13/2016 chest CT. FINDINGS: Endotracheal tube is 2 cm from the carina. Enteric tube tip below the field of view in the abdomen. Left lower lobe consolidation. No pneumothorax. Stable cardiac silhouette. No acute osseous abnormality is evident. IMPRESSION: Endotracheal tube approximately 2 cm from carina. Left lower lobe consolidation. Electronically Signed   By: Kristine Garbe M.D.   On: 10/13/2016 05:29   Dg Chest Portable 1 View  Result Date: 10/13/2016 CLINICAL DATA:  Post intubation. EXAM: PORTABLE CHEST 1 VIEW COMPARISON:  10/12/2016 FINDINGS: Endotracheal tube is been placed with tip measuring 3.9 cm above the carina. An enteric tube was placed. Tip is off the field of view but below the left hemidiaphragm. Shallow inspiration. Borderline heart size without vascular congestion or edema. No focal consolidation in the lungs. Degenerative changes in the shoulders. Tortuous aorta. IMPRESSION: Appliances appear in satisfactory position. Mild cardiac enlargement. No focal airspace disease or consolidation in the lungs. Electronically Signed   By: Lucienne Capers M.D.   On: 10/13/2016 01:23   Dg Chest Port 1 View  Result Date: 10/12/2016 CLINICAL DATA:  Weakness for 2 weeks. Shortness of breath and wheezing tonight. EXAM: PORTABLE CHEST 1 VIEW COMPARISON:  07/11/2016 FINDINGS: Cardiac enlargement without vascular congestion or edema. No focal consolidation in the lungs. No blunting of costophrenic angles. No pneumothorax. Degenerative changes in the spine and shoulders. IMPRESSION: Cardiac enlargement.  No evidence of active pulmonary disease. Electronically Signed   By: Lucienne Capers M.D.   On: 10/12/2016 23:55   Dg Abd Portable 1v  Result Date: 10/19/2016 CLINICAL DATA:  Evaluate orogastric tube position. EXAM: PORTABLE ABDOMEN - 1 VIEW COMPARISON:  Abdominal radiograph of October 13, 2016 FINDINGS: The proximal port of the orogastric tube lies just above the GE junction. The tip  lies at the junction of the cardia and proximal body of the stomach. IMPRESSION: Advancement of the orogastric tube by a 10 cm is recommended to assure that the proximal port lies below the GE junction. Electronically Signed   By: David  Martinique M.D.   On: 10/19/2016 10:11    Labs:  CBC:  Recent Labs  10/19/16 0500 10/21/16 0446 10/22/16 0434 10/23/16 0455  WBC 6.7 7.2 4.9 4.6  HGB 7.9* 8.0* 8.2* 8.0*  HCT 23.9* 25.0* 25.6* 25.3*  PLT 105* 153 168 225    COAGS:  Recent Labs  09/25/16 1202 10/13/16 0058  INR 1.31 1.22  APTT 30 32    BMP:  Recent Labs  10/20/16 0923 10/21/16 0446 10/22/16 0434 10/23/16 0455  NA 142 141 142 139  K 3.8 3.6 3.4* 3.5  CL 113* 112* 110 108  CO2 21* 24 23 24   GLUCOSE 122* 125* 97 98  BUN 29* 24* 20 17  CALCIUM 8.7* 8.7* 8.8* 8.5*  CREATININE 1.97* 1.84* 1.71* 1.59*  GFRNONAA 24* 26* 28* 31*  GFRAA 27* 30* 33* 36*    LIVER FUNCTION TESTS:  Recent Labs  03/17/16 1818 10/12/16 2302  10/20/16 0427 10/21/16 0446 10/22/16 0434 10/23/16 0455  BILITOT  0.6 1.0  --   --   --   --   --   AST 21 33  --   --   --   --   --   ALT 20 30  --   --   --   --   --   ALKPHOS 76 97  --   --   --   --   --   PROT 7.2 8.0  --   --   --   --   --   ALBUMIN 4.0 3.3*  < > 2.2* 1.8* 1.7* 1.8*  < > = values in this interval not displayed.  TUMOR MARKERS: No results for input(s): AFPTM, CEA, CA199, CHROMGRNA in the last 8760 hours.  Assessment and Plan:  Dysphagia PCM Post laryngeal mass surgery complicated with perforated esophagus Now extubated and healing Now scheduled for percutaneous gastric tube placement in IR Risks and Benefits discussed with the patient and son including, but not limited to the need for a barium enema during the procedure, bleeding, infection, peritonitis, or damage to adjacent structures. All of the patient's questions were answered, they areagreeable to proceed. Consent signed and in chart.   Thank you for this  interesting consult.  I greatly enjoyed meeting Mandy Rhodes and look forward to participating in their care.  A copy of this report was sent to the requesting provider on this date.  Electronically Signed: Mayfield Schoene A 10/23/2016, 1:46 PM   I spent a total of 40 Minutes    in face to face in clinical consultation, greater than 50% of which was counseling/coordinating care for perc gastric tube placement

## 2016-10-23 NOTE — Progress Notes (Addendum)
Hypoglycemic Event  CBG: 58  Treatment: 41ml D50  Symptoms:none  Follow-up CBG: Time:0035 CBG Result:93  Possible Reasons for Event: NPO  Comments/MD notified: K. Schorr @0039  McQuaid @0106    Maquita Sandoval, [name]

## 2016-10-23 NOTE — Progress Notes (Addendum)
INFECTIOUS DISEASE PROGRESS NOTE  ID: Mandy Rhodes is a 75 y.o. female with  Principal Problem:   Sepsis ([place]) Active Problems:   Essential hypertension   Type 2 diabetes mellitus with stage 3 chronic kidney disease, without long-term current use of insulin (HCC)   Chronic combined systolic and diastolic heart failure (HCC)   Non-ischemic cardiomyopathy (HCC)   Acute hypercapnic respiratory failure (HCC)   Acute kidney injury superimposed on CKD (HCC)   Pressure injury of skin   PAT (paroxysmal atrial tachycardia) (HCC)   Malnutrition of moderate degree   Laryngeal mass   Hyperosmolality and/or hypernatremia   Anemia   Hypokalemia   Demand ischemia ([place])   CAP (community acquired pneumonia)   Acute encephalopathy   Vertebral osteomyelitis ([place])  Subjective: Awake and alert, no complaints.  Has not taken po  Abtx:  Anti-infectives    Start     Dose/Rate Route Frequency Ordered Stop   10/23/16 1300  Ampicillin-Sulbactam (UNASYN) 3 g in sodium chloride 0.9 % 100 mL IVPB     3 g 200 mL/hr over 30 Minutes Intravenous Every 8 hours 10/23/16 0803     10/20/16 1800  Ampicillin-Sulbactam (UNASYN) 3 g in sodium chloride 0.9 % 100 mL IVPB  Status:  Discontinued     3 g 200 mL/hr over 30 Minutes Intravenous Every 12 hours 10/20/16 0756 10/23/16 0803   10/20/16 1400  Ampicillin-Sulbactam (UNASYN) 3 g in sodium chloride 0.9 % 100 mL IVPB  Status:  Discontinued     3 g 200 mL/hr over 30 Minutes Intravenous Every 8 hours 10/20/16 0744 10/20/16 0756   10/18/16 1938  bacitracin 50,000 Units in sodium chloride irrigation 0.9 % 500 mL irrigation  Status:  Discontinued       As needed 10/18/16 1954 10/18/16 2059   10/17/16 1000  vancomycin (VANCOCIN) IVPB 1000 mg/200 mL premix  Status:  Discontinued     1,000 mg 200 mL/hr over 60 Minutes Intravenous Every 24 hours 10/17/16 0943 10/21/16 1043   10/13/16 1700  Ampicillin-Sulbactam (UNASYN) 3 g in sodium chloride 0.9 % 100 mL IVPB   Status:  Discontinued     3 g 200 mL/hr over 30 Minutes Intravenous Every 12 hours 10/13/16 0714 10/20/16 0744   10/13/16 0300  Ampicillin-Sulbactam (UNASYN) 3 g in sodium chloride 0.9 % 100 mL IVPB  Status:  Discontinued     3 g 200 mL/hr over 30 Minutes Intravenous Every 8 hours 10/13/16 0246 10/13/16 0714      Medications:  Scheduled: . ampicillin-sulbactam (UNASYN) IV  3 g Intravenous Q8H  . chlorhexidine  15 mL Mouth Rinse BID  . heparin subcutaneous  5,000 Units Subcutaneous Q8H  . insulin aspart  0-9 Units Subcutaneous Q4H  . ipratropium  0.5 mg Nebulization Q6H  . levalbuterol  0.63 mg Nebulization Q6H  . mouth rinse  15 mL Mouth Rinse q12n4p  . pantoprazole (PROTONIX) IV  40 mg Intravenous Q24H    Objective: Vital signs in last 24 hours: Temp:  [97.4 F (36.3 C)-99 F (37.2 C)] 97.4 F (36.3 C) (12/04 0800) Pulse Rate:  [62-104] 72 (12/04 0900) Resp:  [8-25] 18 (12/04 0900) BP: (81-198)/(31-93) 137/93 (12/04 0900) SpO2:  [98 %-100 %] 100 % (12/04 0900) FiO2 (%):  [30 %] 30 % (12/03 2342) Weight:  [88.1 kg (194 lb 3.6 oz)] 88.1 kg (194 lb 3.6 oz) (12/04 0411)   General appearance: alert, cooperative and no distress Neck: wound clean, drain in place Resp:  rhonchi anterior - bilateral Cardio: regular rate and rhythm GI: normal findings: bowel sounds normal and soft, non-tender  Lab Results  Recent Labs  10/22/16 0434 10/23/16 0455  WBC 4.9 4.6  HGB 8.2* 8.0*  HCT 25.6* 25.3*  NA 142 139  K 3.4* 3.5  CL 110 108  CO2 23 24  BUN 20 17  CREATININE 1.71* 1.59*   Liver Panel  Recent Labs  10/22/16 0434 10/23/16 0455  ALBUMIN 1.7* 1.8*   Sedimentation Rate No results for input(s): ESRSEDRATE in the last 72 hours. C-Reactive Protein No results for input(s): CRP in the last 72 hours.  Microbiology: Recent Results (from the past 240 hour(s))  Culture, respiratory (tracheal aspirate)     Status: None   Collection Time: 10/13/16 11:38 AM  Result  Value Ref Range Status   Specimen Description TRACHEAL ASPIRATE  Final   Special Requests Normal  Final   Gram Stain   Final    ABUNDANT WBC PRESENT, PREDOMINANTLY PMN RARE SQUAMOUS EPITHELIAL CELLS PRESENT FEW GRAM POSITIVE COCCI IN PAIRS RARE GRAM NEGATIVE RODS RARE BUDDING YEAST SEEN    Culture   Final    Consistent with normal respiratory flora. Performed at [facility]    Report Status 10/16/2016 FINAL  Final  Respiratory Panel by PCR     Status: None   Collection Time: 10/13/16 11:59 AM  Result Value Ref Range Status   Adenovirus NOT DETECTED NOT DETECTED Final   Coronavirus 229E NOT DETECTED NOT DETECTED Final   Coronavirus HKU1 NOT DETECTED NOT DETECTED Final   Coronavirus NL63 NOT DETECTED NOT DETECTED Final   Coronavirus OC43 NOT DETECTED NOT DETECTED Final   Metapneumovirus NOT DETECTED NOT DETECTED Final   Rhinovirus / Enterovirus NOT DETECTED NOT DETECTED Final   Influenza A NOT DETECTED NOT DETECTED Final   Influenza B NOT DETECTED NOT DETECTED Final   Parainfluenza Virus 1 NOT DETECTED NOT DETECTED Final   Parainfluenza Virus 2 NOT DETECTED NOT DETECTED Final   Parainfluenza Virus 3 NOT DETECTED NOT DETECTED Final   Parainfluenza Virus 4 NOT DETECTED NOT DETECTED Final   Respiratory Syncytial Virus NOT DETECTED NOT DETECTED Final   Bordetella pertussis NOT DETECTED NOT DETECTED Final   Chlamydophila pneumoniae NOT DETECTED NOT DETECTED Final   Mycoplasma pneumoniae NOT DETECTED NOT DETECTED Final    Comment: Performed at [facility]  Aerobic/Anaerobic Culture (surgical/deep wound)     Status: None (Preliminary result)   Collection Time: 10/20/16 10:28 AM  Result Value Ref Range Status   Specimen Description JP DRAINAGE NECK  Final   Special Requests Normal  Final   Gram Stain   Final    RARE WBC PRESENT, PREDOMINANTLY PMN NO ORGANISMS SEEN    Culture NO GROWTH 3 DAYS  Final   Report Status PENDING  Incomplete    Studies/Results: No  results found.   Assessment/Plan: Retropharyngeal Abscess Cervical plate (removed QA348G)  Cx sent from drain (not OR). 12-1 ngtd To step down  CKD Cr better today  DM Glc well controlled.   Total days of antibiotics:  Day 9 unsayn Day 4 vanco (off 12-3)         Bobby Rumpf Infectious Diseases (pager) [phone] [email or web address] 10/23/2016, 10:32 AM  LOS: 10 days

## 2016-10-23 NOTE — Progress Notes (Signed)
[place] Progress Note Patient Name: Mandy Rhodes DOB: Jan 23, 1941 MRN: [id number]   Date of Service  10/23/2016  HPI/Events of Note  Foley out, now oliguria  eICU Interventions  In and out cath     Intervention Category Intermediate Interventions: Oliguria - evaluation and management  Simonne Maffucci 10/23/2016, 3:46 AM

## 2016-10-23 NOTE — Progress Notes (Signed)
Patient admitted to room 4 E23, VSS. Oriented to room and call bell, bed alarms on

## 2016-10-23 NOTE — Progress Notes (Signed)
Pharmacy Antibiotic Note  Mandy Rhodes is a 75 y.o. female with neck abscess. She continues on unasyn. Tmax 99, WBC wnl and renal function continues to improve to a point that we can increase antibiotic dosing.  Plan: Increase unasyn to 3g IV q8h Monitor culture data, renal function and clinical course   Height: 5\' 6"  (167.6 cm) Weight: 194 lb 3.6 oz (88.1 kg) IBW/kg (Calculated) : 59.3  Temp (24hrs), Avg:98.6 F (37 C), Min:98.1 F (36.7 C), Max:99 F (37.2 C)   Recent Labs Lab 10/17/16 0445  10/19/16 0500 10/20/16 0427 10/20/16 0923 10/21/16 0446 10/22/16 0434 10/23/16 0455  WBC 5.2  --  6.7  --   --  7.2 4.9 4.6  CREATININE 1.94*  < > 1.85*  1.87* 0.76 1.97* 1.84* 1.71* 1.59*  < > = values in this interval not displayed.  Estimated Creatinine Clearance: 34.2 mL/min (by C-G formula based on SCr of 1.59 mg/dL (H)).    Allergies  Allergen Reactions  . Sulfa Antibiotics Swelling    Unasyn 11/24>> Vanc 11/28>>12/2  11/24 BCx2: ng 11/24 Urine Legionella Ag: neg 11/24 Urine Streptococcal Ag: neg 11/24 Respiratory Viral Panel PCR (Tracheal): neg 11/24 Tracheal Aspirate Cx: few GPC, GNR, rare budding yeast F 11/24 MRSA PCR: neg 12/1 surgical/deep wound cx: ngtd   Andrey Cota. Diona Foley, PharmD, BCPS Clinical Pharmacist Pager [phone] 10/23/2016 7:59 AM

## 2016-10-23 NOTE — Care Management Important Message (Signed)
Important Message  Patient Details  Name: Mandy Rhodes MRN: [id number] Date of Birth: 1941-04-21   Medicare Important Message Given:  Other (see comment)    [place], Sheralee Qazi Abena 10/23/2016, 11:21 AM

## 2016-10-23 NOTE — Progress Notes (Signed)
Hypoglycemic Event  CBG: 59  Treatment: 25 ML D50 IV push  Symptoms: none  Follow-up CBG: Time:9:18 PM  CBG Result: 88  Possible Reasons for Event: NPO   Comments/MD notified:    [name], [name]

## 2016-10-23 NOTE — Progress Notes (Signed)
PULMONARY / CRITICAL CARE MEDICINE   Name: Mandy Rhodes MRN: [id number] DOB: 25-May-1941    ADMISSION DATE:  10/12/2016 CONSULTATION DATE:  10/13/2016  REFERRING MD:  Dr. Roxanne Mins EDP  CHIEF COMPLAINT:  Shortness of breath  HISTORY OF PRESENT ILLNESS:  75 y.o. female with past medical history as below, which is significant for diabetes, CHF, chronic kidney disease, and hypertension. She presented to [facility] emergency department 10/12/2016 with complaints of shortness of breath and generalized weakness for about 2 weeks. She has additionally suffered a 30 pound weight loss over some recent period of time which is not entirely clear. Recently treated with antibiotics as an outpatient for suspected bronchitis vs pneumonia.Upon arrival to the emergency department she was on 4 L of supplemental O2 via nasal cannula and still had increased work of breathing.due to her somnolence ABG was ordered which showed  Hypercapnic respiratory failure and she was started on BiPAP. She was known to be a DO NOT RESUSCITATE, however,  Family has decided to proceed with intubation, which was done in the emergency department. During intubation a supraglottic mass was noted by emergency room provider which made intubation difficult. Laboratory evaluation significant forserum creatinine 1.8, calcium 10.5, troponin 0.53, WBC 12, glucose 137.PCCM asked to admit  SUBJECTIVE: No distress  VITAL SIGNS: BP (!) 137/93   Pulse 72   Temp 97.4 F (36.3 C) (Oral)   Resp 18   Ht 5\' 6"  (1.676 m)   Wt 88.1 kg (194 lb 3.6 oz)   SpO2 100%   BMI 31.35 kg/m   HEMODYNAMICS:    VENTILATOR SETTINGS: Vent Mode: PRVC FiO2 (%):  [30 %] 30 % Set Rate:  [20 bmp] 20 bmp Vt Set:  [500 mL] 500 mL PEEP:  [5 cmH20] 5 cmH20  INTAKE / OUTPUT: I/O last 3 completed shifts: In: 2050 [I.V.:1750; IV Piggyback:300] Out: [id number] [Urine:1095; Drains:10]  PHYSICAL EXAMINATION: General:  Awake and alert Neuro:  Following  commands. Secretions reduced HEENT:  Drain in neck gray drainage, swelling almost resolved Cardiovascular:  s1 s2 RRR Pulmonary: clear, strong cough Abdomen:  Soft.NT no r/g Musculoskeletal:  Min edema Skin:  Warm and dry. no rash on exposed skin.  LABS:  BMET  Recent Labs Lab 10/21/16 0446 10/22/16 0434 10/23/16 0455  NA 141 142 139  K 3.6 3.4* 3.5  CL 112* 110 108  CO2 24 23 24   BUN 24* 20 17  CREATININE 1.84* 1.71* 1.59*  GLUCOSE 125* 97 98    Electrolytes  Recent Labs Lab 10/20/16 1821 10/21/16 0446 10/21/16 1625 10/22/16 0434 10/23/16 0455  CALCIUM  --  8.7*  --  8.8* 8.5*  MG 2.0 2.0 2.1  --   --   PHOS 3.4 3.4  3.4 3.3 3.5 3.1    CBC  Recent Labs Lab 10/21/16 0446 10/22/16 0434 10/23/16 0455  WBC 7.2 4.9 4.6  HGB 8.0* 8.2* 8.0*  HCT 25.0* 25.6* 25.3*  PLT 153 168 225    Coag's No results for input(s): APTT, INR in the last 168 hours.  Sepsis Markers No results for input(s): LATICACIDVEN, PROCALCITON, O2SATVEN in the last 168 hours.  ABG  Recent Labs Lab 10/16/16 1205 10/18/16 2010  PHART 7.429 7.416  PCO2ART 40.4 37.5  PO2ART 108 326.0*    Liver Enzymes  Recent Labs Lab 10/21/16 0446 10/22/16 0434 10/23/16 0455  ALBUMIN 1.8* 1.7* 1.8*    Cardiac Enzymes No results for input(s): TROPONINI, PROBNP in the last 168 hours.  Glucose  Recent Labs Lab 10/22/16 1156 10/22/16 1549 10/22/16 1931 10/22/16 2335 10/23/16 0351 10/23/16 0759  GLUCAP 117* 99 83 87 91 96    Imaging No results found.  STUDIES:  CT CHEST/NECK/SOFT TISSUE W/O 11/24: IMPRESSION: 1. Fluid and debris throughout the oropharynx, hypopharynx, and supraglottic airway from intubation. Suboptimal evaluation of the mucosa due to airway opacification and lack of contrast. Follow-up contrast examination after extubation and/or direct visualization recommended for evaluation of the supraglottic mass. 2. No cervical lymphadenopathy identified. 3. Left lower  lobe consolidation and nodular opacities throughout the lungs compatible with pneumonia. Small left pleural effusion. 4. Endotracheal tube 1 cm from carina, retraction recommended. 5. Enteric tube tip below the field of view in the stomach. 6. Moderate coronary artery calcifications. 7. Borderline main pulmonary artery may represent pulmonary artery hypertension. CT HEAD W/O 11/24:  No acute intracranial abnormality. Moderate brain parenchymal atrophy and chronic microvascular disease. TTE 11/24: LV normal in size with EF 30-35%. Moderate concentric LVH. Appearance consistent with infiltrative cardiomyopathy. Moderate diffuse hypokinesis without regional variation. Grade 2 diastolic dysfunction. LA moderately dilated & RA normal in size. RV normal in size and function. No aortic stenosis or regurgitation. Aortic root normal in size. Trivial mitral regurgitation without stenosis. No pulmonic regurgitation. Mild tricuspid regurgitation. No pericardial effusion. 11/28: Direct laryngoscopy, esophagoscopy: Laryngeal and pharyngeal edema.  2x2 cm area of exposed cervical esophageal fusion plate just behind cricopharyngeus. 20-30 mL frank pus released. No cancer identified  MICROBIOLOGY: MRSA PCR 11/24:  Negative Blood Ctx x2 11/24 >>  Urine Legionella Ag 11/24 >> Urine Streptococcal Ag 11/24:  Negative Respiratory Viral Panel PCR 11/24 (Tracheal):  Negative Tracheal Aspirate Ctx 11/24 >> OR 1/29>>>  ANTIBIOTICS: Unasyn 11/24 >>  vanc 11/28>>>12/2  SIGNIFICANT EVENTS: 11/24 - intubated, admitted to ICU. 11/28 Now back from surgery, C-plate perforated thru esophagus 11/29- OR for removal hardware   LINES/TUBES: OETT 7.5 11/23 >>12/1 OGT 11/23 >>out FOLEY 11/23 >> PIV  ASSESSMENT / PLAN:  INFECTIOUS A:   PNA, asp? Infected cervicle hardware likley oseto P:   Maintain ABX per ID  PULMONARY A: Acute Hypercarbic Respiratory Failure Aspiration PNA  H/O Tobacco Use - Cigarettes &  chewing tobacco  P:   Extubated 12/1 IS O2 as needed  CARDIOVASCULAR A:  NSTEMI/Elevated Troponin I - S/P 300mg  Rectal ASA. SVT - Seen since admission. Borderline Hypotension - Likely due to hypovolemia.  Probable Infiltrative Cardiomyopathy - Seen on TTE. Question amyloidosis.  H/O CHF H/O HTN H/o Hyperlipidemia  P:  Continuous Telemetry Monitoring Even balance goals  RENAL Lab Results  Component Value Date   CREATININE 1.59 (H) 10/23/2016   CREATININE 1.71 (H) 10/22/2016   CREATININE 1.84 (H) 10/21/2016   CREATININE 1.55 (H) 10/03/2016   CREATININE 1.67 (H) 08/25/2016   CREATININE 1.72 (H) 08/11/2016   CREATININE 2.1 (H) 01/13/2015   CREATININE 1.5 (H) 12/30/2013   CREATININE 2.0 (H) 12/27/2012    Recent Labs Lab 10/21/16 0446 10/22/16 0434 10/23/16 0455  K 3.6 3.4* 3.5     A:   Acute Renal Failure Hypernatremia resolved H/O CKD Stage   P:   Am chem  D5W to off, back to 1/2 ns  GASTROINTESTINAL A:   Moderate Protein-Calorie Malnutrition H/O GERD OGT placed , now out with vomiting esoph perf P:   NPO Gastric tube out Consider peg or NGT for feeds GI evaluation noted, I will call ENL again, my understanding is that she needs atube to help granulation of esoph overtime  HEMATOLOGIC/ONCOLOGIC A:    Recent Labs  10/22/16 0434 10/23/16 0455  HGB 8.2* 8.0*    Anemia - dilutional  P:  Cbc in am scd  sub q hep  ENDOCRINE CBG (last 3)   Recent Labs  10/22/16 2335 10/23/16 0351 10/23/16 0759  GLUCAP 87 91 96    A:   H/O Diabetes Mellitus Type 2 - A1c 7.2.  P:   SSI per Sensitive Algorithm Accu-Checks q4hr  NEUROLOGIC A:   Acute Encephalopathy resolved H/O Neuropathy - Secondary to diabetes.  P:   RASS goal: 0-1 fent prn Pt consult  FAMILY  - Updates: Pt updated  - Inter-disciplinary family meet or Palliative Care meeting due by:  11/30  To triad, to [place]. Titus Mould, MD, [place] Pgr: [place]  Pulmonary & Critical Care \

## 2016-10-24 DIAGNOSIS — K223 Perforation of esophagus: Secondary | ICD-10-CM

## 2016-10-24 LAB — RENAL FUNCTION PANEL
ALBUMIN: 1.8 g/dL — AB (ref 3.5–5.0)
ANION GAP: 7 (ref 5–15)
BUN: 13 mg/dL (ref 6–20)
CALCIUM: 8.8 mg/dL — AB (ref 8.9–10.3)
CO2: 25 mmol/L (ref 22–32)
Chloride: 109 mmol/L (ref 101–111)
Creatinine, Ser: 1.47 mg/dL — ABNORMAL HIGH (ref 0.44–1.00)
GFR, EST AFRICAN AMERICAN: 39 mL/min — AB (ref >60)
GFR, EST NON AFRICAN AMERICAN: 34 mL/min — AB (ref >60)
Glucose, Bld: 97 mg/dL (ref 65–99)
PHOSPHORUS: 2.7 mg/dL (ref 2.5–4.6)
POTASSIUM: 3.4 mmol/L — AB (ref 3.5–5.1)
SODIUM: 141 mmol/L (ref 135–145)

## 2016-10-24 LAB — GLUCOSE, CAPILLARY
GLUCOSE-CAPILLARY: 73 mg/dL (ref 65–99)
GLUCOSE-CAPILLARY: 80 mg/dL (ref 65–99)
GLUCOSE-CAPILLARY: 87 mg/dL (ref 65–99)
GLUCOSE-CAPILLARY: 88 mg/dL (ref 65–99)
GLUCOSE-CAPILLARY: 89 mg/dL (ref 65–99)
GLUCOSE-CAPILLARY: 93 mg/dL (ref 65–99)
GLUCOSE-CAPILLARY: 95 mg/dL (ref 65–99)

## 2016-10-24 LAB — PROTIME-INR
INR: 1.25
PROTHROMBIN TIME: 15.8 s — AB (ref 11.4–15.2)

## 2016-10-24 MED ORDER — JEVITY 1.2 CAL PO LIQD
1000.0000 mL | ORAL | Status: DC
  Filled 2016-10-24 (×3): qty 1000

## 2016-10-24 MED ORDER — DEXTROSE-NACL 5-0.45 % IV SOLN
INTRAVENOUS | Status: DC
  Administered 2016-10-24 – 2016-10-26 (×2): via INTRAVENOUS

## 2016-10-24 MED ORDER — LEVALBUTEROL HCL 0.63 MG/3ML IN NEBU
0.6300 mg | INHALATION_SOLUTION | Freq: Three times a day (TID) | RESPIRATORY_TRACT | Status: DC
  Administered 2016-10-24 – 2016-10-27 (×10): 0.63 mg via RESPIRATORY_TRACT
  Filled 2016-10-24 (×10): qty 3

## 2016-10-24 MED ORDER — IPRATROPIUM BROMIDE 0.02 % IN SOLN
0.5000 mg | Freq: Three times a day (TID) | RESPIRATORY_TRACT | Status: DC
  Administered 2016-10-24 – 2016-10-27 (×10): 0.5 mg via RESPIRATORY_TRACT
  Filled 2016-10-24 (×10): qty 2.5

## 2016-10-24 NOTE — Progress Notes (Signed)
Completed 16 French intermittent straight catheter, drained 582ml urine.  Patient tolerated well

## 2016-10-24 NOTE — Clinical Social Work Note (Signed)
Clinical Social Work Assessment  Patient Details  Name: Mandy Rhodes MRN: [id number] Date of Birth: 07/21/1941  Date of referral:  10/24/16               Reason for consult:  Facility Placement                Permission sought to share information with:  Facility Contact Representative, Family Supports Permission granted to share information::  Yes, Verbal Permission Granted  Name::     Mandy Rhodes  Agency::  SNFs  Relationship::  daughter-in-laws  Contact Information:  [phone]; [phone]  Housing/Transportation Living arrangements for the past 2 months:  Single Family Home Source of Information:  Patient Patient Interpreter Needed:  None Criminal Activity/Legal Involvement Pertinent to Current Situation/Hospitalization:  No - Comment as needed Significant Relationships:  Adult Children Lives with:  Self Do you feel safe going back to the place where you live?  No Need for family participation in patient care:  Yes (Comment)  Care giving concerns: CSW received consult for possible SNF placement at time of discharge. CSW met with patient regarding PT recommendation of SNF placement at time of discharge. Patient lives alone and is currently unable to care for herself at home given patient's current physical needs and fall risk. Patient expressed understanding of PT recommendation and is agreeable to SNF placement at time of discharge. CSW to continue to follow and assist with discharge planning needs.   Social Worker assessment / plan:  CSW spoke with patient concerning possibility of rehab at SNF before returning home.  Employment status:  Retired Insurance information:  Managed Medicare PT Recommendations:  Skilled Nursing Facility Information / Referral to community resources:  Skilled Nursing Facility  Patient/Family's Response to care:  Patient recognizes need for rehab before returning home and is agreeable to a SNF in [place]. Patient would also like CSW  to touch base with her daughter-in-law regarding snf options.  Patient/Family's Understanding of and Emotional Response to Diagnosis, Current Treatment, and Prognosis:  Patient/family is realistic regarding therapy needs and expressed being hopeful for SNF placement. Patient expressed understanding of CSW role and discharge process. No questions/concerns about plan or treatment.    Emotional Assessment Appearance:  Appears stated age Attitude/Demeanor/Rapport:  Other (Appropriate) Affect (typically observed):  Accepting Orientation:  Oriented to Self, Oriented to Place, Oriented to  Time, Oriented to Situation Alcohol / Substance use:  Not Applicable Psych involvement (Current and /or in the community):  No (Comment)  Discharge Needs  Concerns to be addressed:  Care Coordination Readmission within the last 30 days:  No Current discharge risk:  None Barriers to Discharge:  Continued Medical Work up   [name], LCSWA 10/24/2016, 4:15 PM  

## 2016-10-24 NOTE — NC FL2 (Signed)
[place] LEVEL OF CARE SCREENING TOOL     IDENTIFICATION  Patient Name: Mandy Rhodes Birthdate: Nov 19, 1941 Sex: female Admission Date (Current Location): 10/12/2016  [facility] and [place] Number:  Herbalist and Address:  [facility] [place], [place] [address], [place], [place] [address]      Provider Number: [id number]  Attending Physician Name and Address:  Nita Sells, MD  Relative Name and Phone Number:  Rodena Piety daughter-in-law, [phone]    Current Level of Care: Hospital Recommended Level of Care: [place] Prior Approval Number:    Date Approved/Denied:   PASRR Number: [id number]  Discharge Plan: SNF    Current Diagnoses: Patient Active Problem List   Diagnosis Date Noted  . Esophageal perforation   . Vertebral osteomyelitis ([place])   . Acute encephalopathy   . Sepsis ([place]) 10/16/2016  . Laryngeal mass 10/16/2016  . Hyperosmolality and/or hypernatremia 10/16/2016  . Anemia 10/16/2016  . Hypokalemia 10/16/2016  . Demand ischemia ([place]) 10/16/2016  . CAP (community acquired pneumonia) 10/16/2016  . PAT (paroxysmal atrial tachycardia) ([place]) 10/14/2016  . Malnutrition of moderate degree 10/14/2016  . Acute hypercapnic respiratory failure ([place]) 10/13/2016  . Pressure injury of skin 10/13/2016  . Mass   . Acute kidney injury superimposed on CKD ([place])   . Acute on chronic combined systolic and diastolic congestive heart failure ([place])   . Edema 08/23/2016  . Shortness of breath 07/14/2016  . Medication management 07/14/2016  . Non-ischemic cardiomyopathy ([place]) 07/14/2016  . Hyperlipidemia 07/14/2016  . Chronic combined systolic and diastolic heart failure ([place]) 04/13/2016  . Type 2 diabetes mellitus with stage 3 chronic kidney disease, without long-term current use of insulin ([place])   . CHF (congestive heart failure) ([place]) 03/20/2016  . CKD (chronic kidney disease)   . Acute systolic congestive  heart failure, NYHA class 3 ([place]) 03/19/2016  . Cough   . Cardiomegaly 03/17/2016  . CKD (chronic kidney disease) stage 2, GFR 60-89 ml/min 03/17/2016  . Elevated troponin 03/17/2016  . Essential hypertension 03/17/2016  . DM type 2 causing CKD stage 2 ([place]) 03/17/2016  . Elevated troponin I level 03/17/2016  . Deficiency anemia 09/29/2011  . Diabetes mellitus ([place]) 09/29/2011  . Renal insufficiency 09/29/2011    Orientation RESPIRATION BLADDER Height & Weight     Self, Time, Situation, Place  Normal Continent Weight: 88.1 kg (194 lb 3.6 oz) Height:  5\' 6"  (167.6 cm)  BEHAVIORAL SYMPTOMS/MOOD NEUROLOGICAL BOWEL NUTRITION STATUS      Continent Diet (Please see DC Summary)  AMBULATORY STATUS COMMUNICATION OF NEEDS Skin   Extensive Assist Verbally PU Stage and Appropriate Care (Pressure injury stage II on buttocks and sacrum; closed incision on neck)                       Personal Care Assistance Level of Assistance  Bathing, Feeding, Dressing Bathing Assistance: Maximum assistance Feeding assistance: Independent Dressing Assistance: Limited assistance     Functional Limitations Info             SPECIAL CARE FACTORS FREQUENCY  PT (By licensed PT)     PT Frequency: 5x/week              Contractures      Additional Factors Info  Code Status, Allergies, Insulin Sliding Scale Code Status Info: Full Allergies Info: Sulfa Antibiotics   Insulin Sliding Scale Info: insulin aspart (novoLOG) injection 0-9 Units  Current Medications (10/24/2016):  This is the current hospital active medication list Current Facility-Administered Medications  Medication Dose Route Frequency Provider Last Rate Last Dose  . 0.9 %  sodium chloride infusion  250 mL Intravenous PRN [place], MD      . Ampicillin-Sulbactam (UNASYN) 3 g in sodium chloride 0.9 % 100 mL IVPB  3 g Intravenous Q8H Rebecka Apley, RPH   3 g at 10/24/16 1244  . chlorhexidine (PERIDEX) 0.12 %  solution 15 mL  15 mL Mouth Rinse BID Tiger, MD   15 mL at 10/24/16 1057  . dextrose 5 %-0.45 % sodium chloride infusion   Intravenous Continuous Juanito Doom, MD 50 mL/hr at 10/24/16 0134    . feeding supplement (JEVITY 1.2 CAL) liquid 1,000 mL  1,000 mL Per Tube Continuous Nita Sells, MD      . fentaNYL (SUBLIMAZE) injection 25-50 mcg  25-50 mcg Intravenous Q1H PRN Javier Glazier, MD   50 mcg at 10/19/16 1347  . [START ON 10/25/2016] heparin injection 5,000 Units  5,000 Units Subcutaneous Q8H Monia Sabal, PA-C      . HYDROmorphone (DILAUDID) injection 0.25-0.5 mg  0.25-0.5 mg Intravenous Q5 min PRN Lillia Abed, MD      . insulin aspart (novoLOG) injection 0-9 Units  0-9 Units Subcutaneous Q4H Javier Glazier, MD   Stopped at 10/23/16 0800  . ipratropium (ATROVENT) nebulizer solution 0.5 mg  0.5 mg Nebulization TID Rush Landmark, MD   0.5 mg at 10/24/16 1428  . levalbuterol (XOPENEX) nebulizer solution 0.63 mg  0.63 mg Nebulization TID Jose Shirl Harris, MD   0.63 mg at 10/24/16 1428  . MEDLINE mouth rinse  15 mL Mouth Rinse [place], MD   15 mL at 10/24/16 1245  . ondansetron (ZOFRAN) injection 4 mg  4 mg Intravenous Q8H PRN Mauri [place], MD   4 mg at 10/20/16 1548  . ondansetron (ZOFRAN) injection 4 mg  4 mg Intravenous Once PRN Lillia Abed, MD      . pantoprazole (PROTONIX) injection 40 mg  40 mg Intravenous Q24H Javier Glazier, MD   40 mg at 10/23/16 2119     Discharge Medications: Please see discharge summary for a list of discharge medications.  Relevant Imaging Results:  Relevant Lab Results:   Additional Information SSN: [facility] [address] [place], [place]

## 2016-10-24 NOTE — Progress Notes (Signed)
INFECTIOUS DISEASE PROGRESS NOTE  ID: Mandy Rhodes is a 75 y.o. female with  Principal Problem:   Sepsis ([place]) Active Problems:   Essential hypertension   Type 2 diabetes mellitus with stage 3 chronic kidney disease, without long-term current use of insulin (HCC)   Chronic combined systolic and diastolic heart failure (HCC)   Non-ischemic cardiomyopathy (HCC)   Acute hypercapnic respiratory failure (HCC)   Acute kidney injury superimposed on CKD (HCC)   Pressure injury of skin   PAT (paroxysmal atrial tachycardia) (HCC)   Malnutrition of moderate degree   Laryngeal mass   Hyperosmolality and/or hypernatremia   Anemia   Hypokalemia   Demand ischemia ([place])   CAP (community acquired pneumonia)   Acute encephalopathy   Vertebral osteomyelitis ([place])  Subjective: Without complaints.  Has not eaten  Abtx:  Anti-infectives    Start     Dose/Rate Route Frequency Ordered Stop   10/23/16 1300  Ampicillin-Sulbactam (UNASYN) 3 g in sodium chloride 0.9 % 100 mL IVPB     3 g 200 mL/hr over 30 Minutes Intravenous Every 8 hours 10/23/16 0803     10/20/16 1800  Ampicillin-Sulbactam (UNASYN) 3 g in sodium chloride 0.9 % 100 mL IVPB  Status:  Discontinued     3 g 200 mL/hr over 30 Minutes Intravenous Every 12 hours 10/20/16 0756 10/23/16 0803   10/20/16 1400  Ampicillin-Sulbactam (UNASYN) 3 g in sodium chloride 0.9 % 100 mL IVPB  Status:  Discontinued     3 g 200 mL/hr over 30 Minutes Intravenous Every 8 hours 10/20/16 0744 10/20/16 0756   10/18/16 1938  bacitracin 50,000 Units in sodium chloride irrigation 0.9 % 500 mL irrigation  Status:  Discontinued       As needed 10/18/16 1954 10/18/16 2059   10/17/16 1000  vancomycin (VANCOCIN) IVPB 1000 mg/200 mL premix  Status:  Discontinued     1,000 mg 200 mL/hr over 60 Minutes Intravenous Every 24 hours 10/17/16 0943 10/21/16 1043   10/13/16 1700  Ampicillin-Sulbactam (UNASYN) 3 g in sodium chloride 0.9 % 100 mL IVPB  Status:   Discontinued     3 g 200 mL/hr over 30 Minutes Intravenous Every 12 hours 10/13/16 0714 10/20/16 0744   10/13/16 0300  Ampicillin-Sulbactam (UNASYN) 3 g in sodium chloride 0.9 % 100 mL IVPB  Status:  Discontinued     3 g 200 mL/hr over 30 Minutes Intravenous Every 8 hours 10/13/16 0246 10/13/16 0714      Medications:  Scheduled: . ampicillin-sulbactam (UNASYN) IV  3 g Intravenous Q8H  . chlorhexidine  15 mL Mouth Rinse BID  . [START ON 10/25/2016] heparin subcutaneous  5,000 Units Subcutaneous Q8H  . insulin aspart  0-9 Units Subcutaneous Q4H  . ipratropium  0.5 mg Nebulization TID  . levalbuterol  0.63 mg Nebulization TID  . mouth rinse  15 mL Mouth Rinse q12n4p  . pantoprazole (PROTONIX) IV  40 mg Intravenous Q24H    Objective: Vital signs in last 24 hours: Temp:  [97.5 F (36.4 C)-98.1 F (36.7 C)] 97.9 F (36.6 C) (12/05 0445) Pulse Rate:  [66-104] 100 (12/05 0956) Resp:  [0-22] 7 (12/05 0956) BP: (127-151)/(54-86) 151/86 (12/05 0956) SpO2:  [97 %-100 %] 97 % (12/05 0956)   General appearance: alert, cooperative and no distress Neck: wound clean.  Resp: clear to auscultation bilaterally Cardio: regular rate and rhythm GI: normal findings: bowel sounds normal and soft, non-tender  Lab Results  Recent Labs  10/22/16 0434 10/23/16  0455 10/24/16 0223  WBC 4.9 4.6  --   HGB 8.2* 8.0*  --   HCT 25.6* 25.3*  --   NA 142 139 141  K 3.4* 3.5 3.4*  CL 110 108 109  CO2 23 24 25   BUN 20 17 13   CREATININE 1.71* 1.59* 1.47*   Liver Panel  Recent Labs  10/23/16 0455 10/24/16 0223  ALBUMIN 1.8* 1.8*   Sedimentation Rate No results for input(s): ESRSEDRATE in the last 72 hours. C-Reactive Protein No results for input(s): CRP in the last 72 hours.  Microbiology: Recent Results (from the past 240 hour(s))  Aerobic/Anaerobic Culture (surgical/deep wound)     Status: None (Preliminary result)   Collection Time: 10/20/16 10:28 AM  Result Value Ref Range Status    Specimen Description JP DRAINAGE NECK  Final   Special Requests Normal  Final   Gram Stain   Final    RARE WBC PRESENT, PREDOMINANTLY PMN NO ORGANISMS SEEN    Culture NO GROWTH 4 DAYS  Final   Report Status PENDING  Incomplete    Studies/Results: No results found.   Assessment/Plan: Retropharyngeal Abscess Cervical plate (removed QA348G)  Cx sent from drain (not OR). 12-1- ngtd Would give her 2 more weeks of unasyn  CKD Continues to improve  Total days of antibiotics:  Day 10unsayn  Available as needed.          Bobby Rumpf Infectious Diseases (pager) [phone] [email or web address] 10/24/2016, 11:38 AM  LOS: 11 days

## 2016-10-24 NOTE — Progress Notes (Signed)
Physical Therapy Treatment Patient Details Name: SHAMELL HITTLE MRN: [id number] DOB: [birth year] Today's Date: 10/24/2016    History of Present Illness 75 yo admitted with SOB and weakness, sepsis due to esophageal abscess with perforation. Pt with NSTEMI, VDRF with ETT 11/23-12/1, ACDF hardware removal with I&D. PMHx: DM, CHF, CKD, HTN    PT Comments    Pt making steady progress. Able to stand today and used Denna Haggard to get to recliner.  Follow Up Recommendations  SNF     Equipment Recommendations  Other (comment) (will defer to next venue)    Recommendations for Other Services OT consult     Precautions / Restrictions Precautions Precautions: Fall;Cervical Restrictions Weight Bearing Restrictions: No    Mobility  Bed Mobility Overal bed mobility: Needs Assistance Bed Mobility: Supine to Sit     Supine to sit: +2 for physical assistance;Max assist;HOB elevated     General bed mobility comments: Pt able to assist with moving legs to EOB. Heavy assist to elevate trunk into sitting due to posterior lean into extension.   Transfers Overall transfer level: Needs assistance   Transfers: Sit to/from Stand;Stand Pivot Transfers Sit to Stand: +2 physical assistance;Mod assist Stand pivot transfers: +2 physical assistance (with Steady)       General transfer comment: Assist to bring hips and trunk up. Pt needed incr time when standing from bed. When standing from [facility] seat was able to rise more quickly.   Ambulation/Gait                 Stairs            Wheelchair Mobility    Modified Rankin (Stroke Patients Only)       Balance Overall balance assessment: Needs assistance Sitting-balance support: Bilateral upper extremity supported;Feet supported Sitting balance-Leahy Scale: Poor Sitting balance - Comments: Pt sat EOB x 8 minutes. Initially with mod A due to posterior lean and then progressing to min guard assist and occasional verbal cues to  correct posterior lean Postural control: Posterior lean Standing balance support: Bilateral upper extremity supported Standing balance-Leahy Scale: Poor Standing balance comment: Static standing with Stedy and +2 min A                    Cognition Arousal/Alertness: Awake/alert Behavior During Therapy: Flat affect Overall Cognitive Status: Impaired/Different from baseline Area of Impairment: Memory;Safety/judgement;Following commands       Following Commands: Follows multi-step commands with increased time            Exercises      General Comments        Pertinent Vitals/Pain Pain Assessment: No/denies pain    Home Living                      Prior Function            PT Goals (current goals can now be found in the care plan section) Acute Rehab PT Goals Patient Stated Goal: walk Progress towards PT goals: Progressing toward goals;Goals met and updated - see care plan    Frequency    Min 3X/week      PT Plan Current plan remains appropriate    Co-evaluation             End of Session Equipment Utilized During Treatment: Gait belt;Oxygen Activity Tolerance: Patient tolerated treatment well Patient left: in chair;with call bell/phone within reach;with chair alarm set     Time: 9735-3299 PT Time  Calculation (min) (ACUTE ONLY): 25 min  Charges:  $Therapeutic Activity: 23-37 mins                    G CodesShary Decamp [facility] Nov 03, 2016, 9:38 AM [facility] PT [phone]

## 2016-10-24 NOTE — Progress Notes (Signed)
Mandy Rhodes [id number] DOB: 05/16/1941 DOA: 10/12/2016 PCP: Vidal Schwalbe, MD  Brief narrative: 75 y/o ? Dm ty ii ckd 2-3 htn Iron def anemia sys HF diagnosed 03/2016 EF35-40%, NYHA class III  Cath at the time 03/22/16-non-ob CAD ? AMyloid  Admitted by [facility] 10/13/16 Supraglottic mass noted-ENt consutled 11.28 and 2 x 2cm exposed cervical fusion plate noted + PUS Neurosurg consulted for recs GI consulted for feeding recs-rec G tube ID following with IV ABx  Past medical history-As per Problem list Chart reviewed as below-   Consultants:  reviewwed  Procedures:  multiple  Antibiotics:   Subjective  Alert pleasant oriented in nad No CP Passed stool last pm No fever no chills worked with therapy and was able to move around more No fever no chills no cough Still sputum   Objective    Interim History:   Telemetry: Sinus pvc   Objective: Vitals:   10/23/16 2020 10/23/16 2031 10/23/16 2358 10/24/16 0445  BP: 140/68 140/68 132/63 140/83  Pulse: (!) 104 (!) 104    Resp: 20 18 (!) 7 19  Temp: 98.1 F (36.7 C)  97.6 F (36.4 C) 97.9 F (36.6 C)  TempSrc: Oral  Oral Oral  SpO2: 100% 100% 100% 99%  Weight:      Height:        Intake/Output Summary (Last 24 hours) at 10/24/16 0814 Last data filed at 10/24/16 [id number]  Gross per 24 hour  Intake          1028.67 ml  Output              975 ml  Net            53.67 ml    Exam:  General: eomi ncat, ne3ck wound covered with dressing-no obvious purulence Cardiovascular: s1 s 2no m/r/g Respiratory:  Clear no added sound Abdomen:  Soft nt nd  Skin no le edema Neuro intat move 4 limbs equally  Data Reviewed: Basic Metabolic Panel:  Recent Labs Lab 10/20/16 0923 10/20/16 1217 10/20/16 1821 10/21/16 0446 10/21/16 1625 10/22/16 0434 10/23/16 0455 10/24/16 0223  NA 142  --   --  141  --  142 139 141  K 3.8  --   --  3.6  --  3.4* 3.5 3.4*  CL 113*  --   --  112*  --  110 108 109  CO2 21*   --   --  24  --  23 24 25   GLUCOSE 122*  --   --  125*  --  97 98 97  BUN 29*  --   --  24*  --  20 17 13   CREATININE 1.97*  --   --  1.84*  --  1.71* 1.59* 1.47*  CALCIUM 8.7*  --   --  8.7*  --  8.8* 8.5* 8.8*  MG  --  2.0 2.0 2.0 2.1  --   --   --   PHOS  --  3.0 3.4 3.4  3.4 3.3 3.5 3.1 2.7   Liver Function Tests:  Recent Labs Lab 10/20/16 0427 10/21/16 0446 10/22/16 0434 10/23/16 0455 10/24/16 0223  ALBUMIN 2.2* 1.8* 1.7* 1.8* 1.8*   No results for input(s): LIPASE, AMYLASE in the last 168 hours. No results for input(s): AMMONIA in the last 168 hours. CBC:  Recent Labs Lab 10/18/16 2010 10/19/16 0500 10/21/16 0446 10/22/16 0434 10/23/16 0455  WBC  --  6.7 7.2 4.9 4.6  NEUTROABS  --  5.0 5.3  --   --   HGB 7.5* 7.9* 8.0* 8.2* 8.0*  HCT 22.0* 23.9* 25.0* 25.6* 25.3*  MCV  --  86.9 88.7 89.2 89.1  PLT  --  105* 153 168 225   Cardiac Enzymes: No results for input(s): CKTOTAL, CKMB, CKMBINDEX, TROPONINI in the last 168 hours. BNP: Invalid input(s): POCBNP CBG:  Recent Labs Lab 10/23/16 1553 10/23/16 2020 10/23/16 2350 10/24/16 0035 10/24/16 0435  GLUCAP 81 59* 58* 93 95    Recent Results (from the past 240 hour(s))  Aerobic/Anaerobic Culture (surgical/deep wound)     Status: None (Preliminary result)   Collection Time: 10/20/16 10:28 AM  Result Value Ref Range Status   Specimen Description JP DRAINAGE NECK  Final   Special Requests Normal  Final   Gram Stain   Final    RARE WBC PRESENT, PREDOMINANTLY PMN NO ORGANISMS SEEN    Culture NO GROWTH 3 DAYS  Final   Report Status PENDING  Incomplete     Studies:              All Imaging reviewed and is as per above notation   Scheduled Meds: . ampicillin-sulbactam (UNASYN) IV  3 g Intravenous Q8H  . chlorhexidine  15 mL Mouth Rinse BID  . [START ON 10/25/2016] heparin subcutaneous  5,000 Units Subcutaneous Q8H  . insulin aspart  0-9 Units Subcutaneous Q4H  . ipratropium  0.5 mg Nebulization TID  .  levalbuterol  0.63 mg Nebulization TID  . mouth rinse  15 mL Mouth Rinse q12n4p  . pantoprazole (PROTONIX) IV  40 mg Intravenous Q24H   Continuous Infusions: . dextrose 5 % and 0.45% NaCl 50 mL/hr at 10/24/16 0134     Assessment/Plan:  1. Infected cervical hardware with retropharyngeal abscess-status post exploration and irrigation debridement of prevertebral abscess and removal of hardware 10/18/2016. Infectious disease currently guiding antibiotic currently on Unasyn every 8 hourly. Repeat CBC plus differential in a.m..  2. Dysphagia secondary to infected at night -Will not be able to eat for the time being therefore PEG tube for feeding short-term has been requested. Nutritionist input sought regarding care 3. Acute prior hypercarbic respiratory failure, was intubated initially next dated 10/20/2016. Desaturation screen, Cont IS 4. Non-ST EMI, SVT, hypotensive,? Amyloidosis-monitor on telemetry-hold Lasix 40 daily, captopril 12.5 2 times a day for now 5. History of smoking, COPD, continue Spiriva nebulizations 3 times a day and leave albuterol 3 times a day 6. History diabetes mellitus type II on oral medication-Amaryl 1 mg every morning has been held she was slightly hypoglycemic overnight 1240 7 covering currently with sliding scale coverage 7. GERD-continue Protonix 40 every 24 hours 8. Depression continue Pamelor when able 10 mg daily   Called daughter and HCPOA Will need SNF on d/c and s/w aware  Verneita Griffes, MD  [facility] Pager [phone] 10/24/2016, 8:14 AM    LOS: 11 days

## 2016-10-24 NOTE — Progress Notes (Signed)
[place] Progress Note Patient Name: Mandy Rhodes DOB: 02-25-1941 MRN: [id number]   Date of Service  10/24/2016  HPI/Events of Note  Hypoglycemia, NPO  eICU Interventions  Change fluids to D5 1/2 NS     Intervention Category Intermediate Interventions: Other: (Hypoglycemia)  Simonne Maffucci 10/24/2016, 1:07 AM

## 2016-10-25 ENCOUNTER — Encounter (HOSPITAL_COMMUNITY): Payer: Self-pay | Admitting: Interventional Radiology

## 2016-10-25 ENCOUNTER — Inpatient Hospital Stay (HOSPITAL_COMMUNITY): Payer: Medicare Other

## 2016-10-25 HISTORY — PX: IR GENERIC HISTORICAL: IMG1180011

## 2016-10-25 LAB — COMPREHENSIVE METABOLIC PANEL
ALT: 25 U/L (ref 14–54)
ANION GAP: 8 (ref 5–15)
AST: 19 U/L (ref 15–41)
Albumin: 1.8 g/dL — ABNORMAL LOW (ref 3.5–5.0)
Alkaline Phosphatase: 68 U/L (ref 38–126)
BUN: 10 mg/dL (ref 6–20)
CHLORIDE: 109 mmol/L (ref 101–111)
CO2: 23 mmol/L (ref 22–32)
Calcium: 8.5 mg/dL — ABNORMAL LOW (ref 8.9–10.3)
Creatinine, Ser: 1.3 mg/dL — ABNORMAL HIGH (ref 0.44–1.00)
GFR calc Af Amer: 45 mL/min — ABNORMAL LOW (ref >60)
GFR, EST NON AFRICAN AMERICAN: 39 mL/min — AB (ref >60)
Glucose, Bld: 110 mg/dL — ABNORMAL HIGH (ref 65–99)
POTASSIUM: 3.4 mmol/L — AB (ref 3.5–5.1)
Sodium: 140 mmol/L (ref 135–145)
Total Bilirubin: 0.5 mg/dL (ref 0.3–1.2)
Total Protein: 5.3 g/dL — ABNORMAL LOW (ref 6.5–8.1)

## 2016-10-25 LAB — CBC WITH DIFFERENTIAL/PLATELET
BASOS ABS: 0 10*3/uL (ref 0.0–0.1)
BASOS PCT: 0 %
EOS ABS: 0.1 10*3/uL (ref 0.0–0.7)
EOS PCT: 4 %
HCT: 25.3 % — ABNORMAL LOW (ref 36.0–46.0)
HEMOGLOBIN: 8.2 g/dL — AB (ref 12.0–15.0)
LYMPHS ABS: 0.6 10*3/uL — AB (ref 0.7–4.0)
Lymphocytes Relative: 18 %
MCH: 28.7 pg (ref 26.0–34.0)
MCHC: 32.4 g/dL (ref 30.0–36.0)
MCV: 88.5 fL (ref 78.0–100.0)
Monocytes Absolute: 0.4 10*3/uL (ref 0.1–1.0)
Monocytes Relative: 13 %
NEUTROS PCT: 65 %
Neutro Abs: 2.2 10*3/uL (ref 1.7–7.7)
PLATELETS: 220 10*3/uL (ref 150–400)
RBC: 2.86 MIL/uL — AB (ref 3.87–5.11)
RDW: 17 % — ABNORMAL HIGH (ref 11.5–15.5)
WBC: 3.4 10*3/uL — AB (ref 4.0–10.5)

## 2016-10-25 LAB — AEROBIC/ANAEROBIC CULTURE (SURGICAL/DEEP WOUND)

## 2016-10-25 LAB — GLUCOSE, CAPILLARY
GLUCOSE-CAPILLARY: 87 mg/dL (ref 65–99)
GLUCOSE-CAPILLARY: 82 mg/dL (ref 65–99)
GLUCOSE-CAPILLARY: 115 mg/dL — AB (ref 65–99)
GLUCOSE-CAPILLARY: 85 mg/dL (ref 65–99)
Glucose-Capillary: 87 mg/dL (ref 65–99)

## 2016-10-25 LAB — CBC
HCT: 23.2 % — ABNORMAL LOW (ref 36.0–46.0)
Hemoglobin: 7.5 g/dL — ABNORMAL LOW (ref 12.0–15.0)
MCH: 28.6 pg (ref 26.0–34.0)
MCHC: 32.3 g/dL (ref 30.0–36.0)
MCV: 88.5 fL (ref 78.0–100.0)
PLATELETS: 109 10*3/uL — AB (ref 150–400)
RBC: 2.62 MIL/uL — ABNORMAL LOW (ref 3.87–5.11)
RDW: 16.7 % — AB (ref 11.5–15.5)
WBC: 3 10*3/uL — AB (ref 4.0–10.5)

## 2016-10-25 LAB — MAGNESIUM: MAGNESIUM: 1.8 mg/dL (ref 1.7–2.4)

## 2016-10-25 LAB — AEROBIC/ANAEROBIC CULTURE W GRAM STAIN (SURGICAL/DEEP WOUND)
Culture: NO GROWTH
Special Requests: NORMAL

## 2016-10-25 LAB — PHOSPHORUS: PHOSPHORUS: 2.7 mg/dL (ref 2.5–4.6)

## 2016-10-25 MED ORDER — FENTANYL CITRATE (PF) 100 MCG/2ML IJ SOLN
INTRAMUSCULAR | Status: AC
  Filled 2016-10-25: qty 4

## 2016-10-25 MED ORDER — FENTANYL CITRATE (PF) 100 MCG/2ML IJ SOLN
INTRAMUSCULAR | Status: AC | PRN
  Administered 2016-10-25: 50 ug via INTRAVENOUS
  Administered 2016-10-25: 25 ug via INTRAVENOUS
  Administered 2016-10-25: 50 ug via INTRAVENOUS

## 2016-10-25 MED ORDER — JEVITY 1.2 CAL PO LIQD
1000.0000 mL | ORAL | Status: DC
  Filled 2016-10-25 (×3): qty 1000

## 2016-10-25 MED ORDER — GLUCAGON HCL RDNA (DIAGNOSTIC) 1 MG IJ SOLR
INTRAMUSCULAR | Status: AC | PRN
  Administered 2016-10-25: 1 mg via INTRAVENOUS

## 2016-10-25 MED ORDER — IOPAMIDOL (ISOVUE-300) INJECTION 61%
INTRAVENOUS | Status: AC
  Administered 2016-10-25: 20 mL
  Filled 2016-10-25: qty 50

## 2016-10-25 MED ORDER — MIDAZOLAM HCL 2 MG/2ML IJ SOLN
INTRAMUSCULAR | Status: AC
  Filled 2016-10-25: qty 4

## 2016-10-25 MED ORDER — GLUCAGON HCL RDNA (DIAGNOSTIC) 1 MG IJ SOLR
INTRAMUSCULAR | Status: AC
  Filled 2016-10-25: qty 1

## 2016-10-25 MED ORDER — JEVITY 1.2 CAL PO LIQD
1000.0000 mL | ORAL | Status: DC
  Administered 2016-10-26: 1000 mL
  Filled 2016-10-25 (×4): qty 1000

## 2016-10-25 MED ORDER — LIDOCAINE HCL 1 % IJ SOLN
INTRAMUSCULAR | Status: AC | PRN
  Administered 2016-10-25: 10 mL

## 2016-10-25 MED ORDER — HEPARIN SODIUM (PORCINE) 5000 UNIT/ML IJ SOLN
5000.0000 [IU] | Freq: Three times a day (TID) | INTRAMUSCULAR | Status: DC
  Administered 2016-10-26 – 2016-10-27 (×4): 5000 [IU] via SUBCUTANEOUS
  Filled 2016-10-25 (×4): qty 1

## 2016-10-25 MED ORDER — LIDOCAINE HCL 1 % IJ SOLN
INTRAMUSCULAR | Status: AC
  Filled 2016-10-25: qty 20

## 2016-10-25 MED ORDER — MIDAZOLAM HCL 2 MG/2ML IJ SOLN
INTRAMUSCULAR | Status: AC | PRN
  Administered 2016-10-25 (×3): 1 mg via INTRAVENOUS

## 2016-10-25 NOTE — Progress Notes (Signed)
LCSW following for disposition for SNF. Bed offers/list was given to family on 12/5. Call placed to daughter in law in effort to understand which bed they would like to choose. Message left for Rodena Piety and will follow up once call returned.  Plan: SNF, pending bed choice.  Lane Hacker, MSW Clinical Social Work: [employer] Coverage for :  [phone]

## 2016-10-25 NOTE — Sedation Documentation (Signed)
Attempting OG

## 2016-10-25 NOTE — Procedures (Signed)
Interventional Radiology Procedure Note  Procedure: Placement of percutaneous 20F pull-through gastrostomy tube. Complications: None Recommendations: - NPO except for sips and chips remainder of today and overnight - Maintain G-tube to LWS until tomorrow morning  - May advance diet as tolerated and begin using tube tomorrow morning  Signed,  Rithvik Orcutt K. Elza Varricchio, MD   

## 2016-10-25 NOTE — Progress Notes (Addendum)
Nutrition Consult/Follow Up  DOCUMENTATION CODES:   Non-severe (moderate) malnutrition in context of acute illness/injury  INTERVENTION:    Once G-tube able to be used, initiate Jevity 1.2 formula at 20 ml/hr and increase by 10 ml every 4 hours to goal rate of 70 ml/hr  TF regimen to provide 2016 kcals, 93 gm protein, 1356 ml of free water  NUTRITION DIAGNOSIS:   Inadequate oral intake related to inability to eat as evidenced by NPO status, ongoing  GOAL:   Patient will meet greater than or equal to 90% of their needs, progressing  MONITOR:   TF tolerance, Labs, Weight trends, Skin, I & O's  ASSESSMENT:   Pt with PMH for DM, CHF, CKD, and HTN. Pt admitted with SOB x 2 weeks and 30 pound weight loss. Recently treated with antibiotics as an outpatient for suspected bronchitis vs pneumonia. MD addressing severe CAP, esophageal perforation with exposed hardware, infected cervical hardware, possible osteomyelitis  Pt s/p procedure 11/29: DEBRIDEMENT AND DRAINAGE CERVICAL PREVERTEBRAL ABSCESS  Pt extubated 12/1. TF (Vital AF 1.2) discontinued via OGT with extubation.  Plan is for G-tube placement per IR for nutrition support. ABX per Infectious Disease. CBG's [id number].  Diet Order:  Diet NPO time specified Except for: Sips with Meds  Skin:  Wound (see comment) (Stage II PU on left buttocks)  Last BM:  12/6  Height:   Ht Readings from Last 1 Encounters:  10/18/16 5\' 6"  (1.676 m)    Weight:   Wt Readings from Last 1 Encounters:  10/25/16 202 lb 13.2 oz (92 kg)    Ideal Body Weight:  59.09 kg  BMI:  Body mass index is 32.74 kg/m.  Estimated Nutritional Needs:   Kcal:  1850-2050  Protein:  90-100 gm  Fluid:  1.8-2.0 L  EDUCATION NEEDS:   No education needs identified at this time  Arthur Holms, RD, LDN Pager #: [phone] After-Hours Pager #: [phone]

## 2016-10-25 NOTE — Sedation Documentation (Addendum)
Suctioned for oral secretions multiple times, o2 increased to 4L/[place]

## 2016-10-25 NOTE — Sedation Documentation (Signed)
Awaiting transport team, report called to floor RN, tolerated well.  Oral secretions continue as before procedure.  Occasional yankeur suction used.

## 2016-10-25 NOTE — Progress Notes (Signed)
Pharmacy Antibiotic Note Mandy Rhodes is a 75 y.o. female admitted on 10/12/2016 with retropharyngeal abscess. Pharmacy following for Unasyn dosing.   Plan: 1. Continue Unasyn 3 grams IV every 8 hours with planned stop date of 11/07/16 per ID recommendations on 12/5.  2. Following along with you daily.   Height: 5\' 6"  (167.6 cm) Weight: 202 lb 13.2 oz (92 kg) IBW/kg (Calculated) : 59.3  Temp (24hrs), Avg:97.6 F (36.4 C), Min:96 F (35.6 C), Max:98.5 F (36.9 C)   Recent Labs Lab 10/19/16 0500  10/21/16 0446 10/22/16 0434 10/23/16 0455 10/24/16 0223 10/25/16 0459  WBC 6.7  --  7.2 4.9 4.6  --  3.0*  CREATININE 1.85*  1.87*  < > 1.84* 1.71* 1.59* 1.47* 1.30*  < > = values in this interval not displayed.  Estimated Creatinine Clearance: 42.7 mL/min (by C-G formula based on SCr of 1.3 mg/dL (H)).    Allergies  Allergen Reactions  . Sulfa Antibiotics Swelling    Antimicrobials this admission:  11/24 Unasyn >> 11/28 vanc>>12/2   Dose adjustments this admission: 11/24: Unasyn to 3 grams q12h for CrCl < 50ml/min 12/4: Unasyn to 3 grams q 8 hours for CrCl > 30 ml/min   Microbiology results: 11/24 Blood Cx x2:NGF 11/24 Urine Legionella Ag: neg 11/24 Urine Streptococcal Ag: neg 11/24 Respiratory Viral Panel PCR (Tracheal): neg 11/24 Tracheal Aspirate Cx: few GPC, GNR, rare budding yeast F 11/24 MRSA PCR: Negative 12/1 surgical/deep wound cx: ngF  Thank you for allowing pharmacy to be a part of this patient's care.  Vincenza Hews, PharmD, BCPS 10/25/2016, 9:50 AM Pager: [phone]

## 2016-10-25 NOTE — Progress Notes (Signed)
Pt O2 sats 97-100% on Room Air throughout shift. When pt fell asleep, O2 sats dropped into 70s without O2. Nasal cannula was put on 1L and sats improved.

## 2016-10-25 NOTE — Sedation Documentation (Signed)
Moved top IR, positioned on table, o2 2l/[place] applied

## 2016-10-25 NOTE — Sedation Documentation (Signed)
Dr Laurence Ferrari at bedside procedure explained and questions answered, awaiting IR room availability now, pt aware.

## 2016-10-25 NOTE — Progress Notes (Signed)
Mandy Rhodes [id number] DOB: 04-08-1941 DOA: 10/12/2016 PCP: Vidal Schwalbe, MD  Brief narrative: 75 y/o ? Dm ty ii ckd 2-3 htn Iron def anemia sys HF diagnosed 03/2016 EF35-40%, NYHA class III  Cath at the time 03/22/16-non-ob CAD ? Amyloid  Admitted by [facility] 10/13/16 Supraglottic mass noted-ENt consulted 11.28 and 2 x 2cm exposed cervical fusion plate noted + PUS Neurosurg consulted for recs GI consulted for feeding recs-rec G tube ID following with IV ABx  Past medical history-As per Problem list Chart reviewed as below-   Consultants:  reviewwed  Procedures:  Ct neck 11/24  Ct head 11/24  Tee 11/24  Direct laryngoscopy 11/28  SIGNIFICANT EVENTS: 11/24 - intubated, admitted to ICU. 11/28 Now back from surgery, C-plate perforated thru esophagus 11/29- OR for removal hardware   LINES/TUBES: OETT 7.5 11/23 >>12/1 OGT 11/23 >>out FOLEY 11/23 >> PIV  Antibiotics: ANTIBIOTICS: Unasyn 11/24 >>  vanc 11/28>>>12/2   Subjective   Well no new issues tol dressing No pain  no cp No n/v Passed stool x 1 yesterday No fever   Objective    Interim History:   Telemetry: Sinus pvc   Objective: Vitals:   10/25/16 0401 10/25/16 0405 10/25/16 0500 10/25/16 0600  BP: 97/81 97/81    Pulse: 81 (!) 104 99 99  Resp: 15 (!) 23 10 (!) 0  Temp: 98.4 F (36.9 C)     TempSrc: Oral     SpO2: 98% 98% 96% 97%  Weight:   92 kg (202 lb 13.2 oz)   Height:        Intake/Output Summary (Last 24 hours) at 10/25/16 0748 Last data filed at 10/25/16 0650  Gross per 24 hour  Intake             1075 ml  Output              400 ml  Net              675 ml    Exam:  General: eomi ncat, ne3ck wound covered with dressing-appears clean on exam with scab Cardiovascular: s1 s 2no m/r/g Respiratory:  Clear no added sound Abdomen:  Soft nt nd  Skin no le edema Neuro intat move 4 limbs equally  Data Reviewed: Basic Metabolic Panel:  Recent Labs Lab  10/20/16 1217 10/20/16 1821 10/21/16 0446 10/21/16 1625 10/22/16 0434 10/23/16 0455 10/24/16 0223 10/25/16 0459  NA  --   --  141  --  142 139 141 140  K  --   --  3.6  --  3.4* 3.5 3.4* 3.4*  CL  --   --  112*  --  110 108 109 109  CO2  --   --  24  --  23 24 25 23   GLUCOSE  --   --  125*  --  97 98 97 110*  BUN  --   --  24*  --  20 17 13 10   CREATININE  --   --  1.84*  --  1.71* 1.59* 1.47* 1.30*  CALCIUM  --   --  8.7*  --  8.8* 8.5* 8.8* 8.5*  MG 2.0 2.0 2.0 2.1  --   --   --  1.8  PHOS 3.0 3.4 3.4  3.4 3.3 3.5 3.1 2.7 2.7   Liver Function Tests:  Recent Labs Lab 10/21/16 0446 10/22/16 0434 10/23/16 0455 10/24/16 0223 10/25/16 0459  AST  --   --   --   --  19  ALT  --   --   --   --  25  ALKPHOS  --   --   --   --  68  BILITOT  --   --   --   --  0.5  PROT  --   --   --   --  5.3*  ALBUMIN 1.8* 1.7* 1.8* 1.8* 1.8*   No results for input(s): LIPASE, AMYLASE in the last 168 hours. No results for input(s): AMMONIA in the last 168 hours. CBC:  Recent Labs Lab 10/19/16 0500 10/21/16 0446 10/22/16 0434 10/23/16 0455 10/25/16 0459  WBC 6.7 7.2 4.9 4.6 3.0*  NEUTROABS 5.0 5.3  --   --   --   HGB 7.9* 8.0* 8.2* 8.0* 7.5*  HCT 23.9* 25.0* 25.6* 25.3* 23.2*  MCV 86.9 88.7 89.2 89.1 88.5  PLT 105* 153 168 225 PENDING   Cardiac Enzymes: No results for input(s): CKTOTAL, CKMB, CKMBINDEX, TROPONINI in the last 168 hours. BNP: Invalid input(s): POCBNP CBG:  Recent Labs Lab 10/24/16 1329 10/24/16 1802 10/24/16 1952 10/24/16 2338 10/25/16 0435  GLUCAP 80 89 87 73 87    Recent Results (from the past 240 hour(s))  Aerobic/Anaerobic Culture (surgical/deep wound)     Status: None (Preliminary result)   Collection Time: 10/20/16 10:28 AM  Result Value Ref Range Status   Specimen Description JP DRAINAGE NECK  Final   Special Requests Normal  Final   Gram Stain   Final    RARE WBC PRESENT, PREDOMINANTLY PMN NO ORGANISMS SEEN    Culture NO GROWTH 4 DAYS   Final   Report Status PENDING  Incomplete     Studies:              All Imaging reviewed and is as per above notation   Scheduled Meds: . ampicillin-sulbactam (UNASYN) IV  3 g Intravenous Q8H  . chlorhexidine  15 mL Mouth Rinse BID  . [START ON 10/26/2016] heparin subcutaneous  5,000 Units Subcutaneous Q8H  . insulin aspart  0-9 Units Subcutaneous Q4H  . ipratropium  0.5 mg Nebulization TID  . levalbuterol  0.63 mg Nebulization TID  . mouth rinse  15 mL Mouth Rinse q12n4p  . pantoprazole (PROTONIX) IV  40 mg Intravenous Q24H   Continuous Infusions: . dextrose 5 % and 0.45% NaCl 50 mL/hr at 10/24/16 0134  . feeding supplement (JEVITY 1.2 CAL)       Assessment/Plan:  1. Infected cervical hardware with retropharyngeal abscess-status post exploration and irrigation debridement of prevertebral abscess and removal of hardware 10/18/2016. Infectious disease states Unasyn every 8 hourly till. 11/07/16 Repeat CBC plus differential in a.m.  Picc line ordered 12/6 2. Dysphagia secondary to infected at night -Will not be able to eat for the time being therefore PEG tube for feeding short-term has been requested. Nutritionist input sought regarding care 3. Acute prior hypercarbic respiratory failure, was intubated initially next dated 10/20/2016. Desaturation screen, Cont IS.  Doesn't use oxygen at home.   4. Decubitus sacral stage 2-turn q 2 hr.  ambulate 5. Non-ST EMI, SVT, hypotensive,? Amyloidosis-monitor on telemetry-hold Lasix 40 daily, captopril 12.5 2 times a day for now 6. History of smoking, COPD, continue Spiriva nebulizations 3 times a day and leave albuterol 3 times a day 7. History diabetes mellitus type II on oral medication-Amaryl 1 mg every morning has been held she was slightly hypoglycemic overnight 10/23/16 covering currently with sliding scale coverage-cbg ranges 80-87 between 8. GERD-continue Protonix 40  every 24 hours 9. Depression continue Pamelor when able 10 mg  daily   transfer to med surg am Will need SNF on d/c and s/w aware  Verneita Griffes, MD  [facility] Pager [phone] 10/25/2016, 7:48 AM    LOS: 12 days

## 2016-10-26 ENCOUNTER — Inpatient Hospital Stay (HOSPITAL_COMMUNITY): Payer: Medicare Other

## 2016-10-26 LAB — GLUCOSE, CAPILLARY
GLUCOSE-CAPILLARY: 83 mg/dL (ref 65–99)
GLUCOSE-CAPILLARY: 69 mg/dL (ref 65–99)
GLUCOSE-CAPILLARY: 122 mg/dL — AB (ref 65–99)
GLUCOSE-CAPILLARY: 135 mg/dL — AB (ref 65–99)
Glucose-Capillary: 76 mg/dL (ref 65–99)
Glucose-Capillary: 105 mg/dL — ABNORMAL HIGH (ref 65–99)

## 2016-10-26 LAB — RENAL FUNCTION PANEL
ANION GAP: 6 (ref 5–15)
Albumin: 1.8 g/dL — ABNORMAL LOW (ref 3.5–5.0)
BUN: 8 mg/dL (ref 6–20)
CHLORIDE: 112 mmol/L — AB (ref 101–111)
CO2: 25 mmol/L (ref 22–32)
Calcium: 8.7 mg/dL — ABNORMAL LOW (ref 8.9–10.3)
Creatinine, Ser: 1.28 mg/dL — ABNORMAL HIGH (ref 0.44–1.00)
GFR calc non Af Amer: 40 mL/min — ABNORMAL LOW (ref >60)
GFR, EST AFRICAN AMERICAN: 46 mL/min — AB (ref >60)
Glucose, Bld: 82 mg/dL (ref 65–99)
Phosphorus: 2.9 mg/dL (ref 2.5–4.6)
Potassium: 3 mmol/L — ABNORMAL LOW (ref 3.5–5.1)
Sodium: 143 mmol/L (ref 135–145)

## 2016-10-26 MED ORDER — SODIUM CHLORIDE 0.9% FLUSH
10.0000 mL | INTRAVENOUS | Status: DC | PRN
  Administered 2016-10-27: 10 mL
  Filled 2016-10-26: qty 40

## 2016-10-26 MED ORDER — POTASSIUM CHLORIDE 20 MEQ/15ML (10%) PO SOLN
40.0000 meq | Freq: Every day | ORAL | Status: DC
  Administered 2016-10-26 – 2016-10-27 (×2): 40 meq
  Filled 2016-10-26 (×2): qty 30

## 2016-10-26 MED ORDER — GLUCOSE 40 % PO GEL
ORAL | Status: AC
  Administered 2016-10-26: 37.5 g
  Filled 2016-10-26: qty 1

## 2016-10-26 MED ORDER — POTASSIUM CHLORIDE CRYS ER 20 MEQ PO TBCR: 40.0000 meq | EXTENDED_RELEASE_TABLET | Freq: Every day | ORAL | Status: DC

## 2016-10-26 MED ORDER — SODIUM CHLORIDE 0.9% FLUSH
10.0000 mL | Freq: Two times a day (BID) | INTRAVENOUS | Status: DC
Start: 2016-10-26 — End: 2016-10-27
  Administered 2016-10-26: 10 mL

## 2016-10-26 NOTE — Progress Notes (Signed)
NURSING PROGRESS NOTE  Mandy Rhodes [id number] Transfer Data: 10/26/2016 7:00 PM Attending Provider: Nita Sells, MD [id number], MD Code Status: FULL Allergies:  Sulfa antibiotics Past Medical History:   has a past medical history of Anemia; Arthritis; CHF (congestive heart failure) ([place]); Chronic kidney disease; Diabetes mellitus; GERD (gastroesophageal reflux disease); History of rotator cuff tear; History of spinal stenosis; Hypertension; and Neuropathy associated with endocrine disorder ([place]). Past Surgical History:   has a past surgical history that includes Neuroplasty / transposition median nerve at carpal tunnel (Bilateral); Cholecystectomy (1969); Abdominal hysterectomy (1971); Back surgery (2009); Appendectomy; Foot surgery (Bilateral); Cyst removal trunk (Right, 12/03/2014); Cardiac catheterization (N/A, 03/22/2016); Direct laryngoscopy (N/A, 10/17/2016); Tracheostomy tube placement (N/A, 10/17/2016); Anterior cervical decomp/discectomy fusion (N/A, 10/18/2016); and ir generic historical (10/25/2016). Social History:   reports that she has quit smoking. She has never used smokeless tobacco. She reports that she does not drink alcohol or use drugs.  Mandy Rhodes is a 75 y.o. female patient transferred from 4E>  Blood pressure (!) 105/59, pulse (!) 101, temperature 98.3 F (36.8 C), temperature source Oral, resp. rate 20, height 5\' 6"  (1.676 m), weight 92.1 kg (203 lb 0.7 oz), SpO2 97 %.  Cardiac Monitoring: Box # in place. Cardiac monitor yields:normal sinus rhythm.  IV Fluids:  IV in place, occlusive dsg intact without redness, IV cath PICC RUA D5W/0.45 NaCl.   Skin: Stage 2 sacrum with foam dsg  Will continue to evaluate and treat per MD orders.  Mandy Rhodes

## 2016-10-26 NOTE — Progress Notes (Signed)
CSW received call from patient son [name] stating that first choice for SNF would be [facility] and 2nd choice is [facility]  CSW updated patient who is in agreement with theses choices  CSW will continue to follow- anticipate DC tomorrow if patient is stable.  Jorge Ny, LCSW Clinical Social Worker [phone]

## 2016-10-26 NOTE — Progress Notes (Signed)
Physical Therapy Treatment Patient Details Name: Mandy Rhodes MRN: [id number] DOB: [birth year] Today's Date: 10/26/2016    History of Present Illness Pt is a 75 y/o female admitted with SOB and weakness, sepsis due to esophageal abscess with perforation. Pt with NSTEMI, VDRF with ETT 11/23-12/1, ACDF hardware removal with I&D. PMHx: DM, CHF, CKD, HTN    PT Comments    Pt presented supine in bed with HOB elevated, awake and willing to participate in therapy session. Pt with reports of dizziness in sitting. Her BP was measured at that time, 100/44mmHg. Pt's nurse reported that this is consistent with her BP while she has been in the hospital. Pt with greater weakness and fatigue this session as compared to last and was unable to fully achieve erect standing posture with max A x3 and use of the Stedy. Pt would continue to benefit from skilled physical therapy services at this time while admitted and after d/c to address her limitations in order to improve her overall safety and independence with functional mobility.   Follow Up Recommendations  SNF     Equipment Recommendations  Other (comment) (defer to next venue)    Recommendations for Other Services OT consult     Precautions / Restrictions Precautions Precautions: Fall;Cervical Restrictions Weight Bearing Restrictions: No    Mobility  Bed Mobility Overal bed mobility: Needs Assistance Bed Mobility: Supine to Sit     Supine to sit: +2 for physical assistance;Max assist;HOB elevated     General bed mobility comments: Pt assisting with movement of bilateral LEs to EOB. Max A to elevate trunk into sitting secondary to posterior lean into extension.   Transfers Overall transfer level: Needs assistance   Transfers: Sit to/from Stand Sit to Stand: +2 physical assistance;Max assist         General transfer comment: Attempted STS into Stedy x2 with max A x2 on first attempt and max A x3 on second attempt. Pt unable to  bring hips underneath of her trunk to allow for the seat of the Stedy to be positioned underneath of her.   Ambulation/Gait                 Stairs            Wheelchair Mobility    Modified Rankin (Stroke Patients Only)       Balance Overall balance assessment: Needs assistance Sitting-balance support: Feet supported;Bilateral upper extremity supported Sitting balance-Leahy Scale: Poor Sitting balance - Comments: Pt tolerated sitting EOB x 10 minutes. Initially pt required max A secondary to posterior lean and then progressing to min guard for safety Postural control: Posterior lean Standing balance support: Bilateral upper extremity supported Standing balance-Leahy Scale: Poor                      Cognition Arousal/Alertness: Awake/alert Behavior During Therapy: Flat affect Overall Cognitive Status: Within Functional Limits for tasks assessed                      Exercises      General Comments        Pertinent Vitals/Pain Pain Assessment: No/denies pain    Home Living                      Prior Function            PT Goals (current goals can now be found in the care plan section) Acute Rehab PT Goals  Patient Stated Goal: walk PT Goal Formulation: With patient Time For Goal Achievement: 11/04/16 Potential to Achieve Goals: Fair Progress towards PT goals: Progressing toward goals    Frequency    Min 3X/week      PT Plan Current plan remains appropriate    Co-evaluation             End of Session Equipment Utilized During Treatment: Gait belt Activity Tolerance: Patient limited by fatigue Patient left: in bed;with call bell/phone within reach;with nursing/sitter in room (sitting EOB preparing for a bath with nurse tech)     Time: 1010-1035 PT Time Calculation (min) (ACUTE ONLY): 25 min  Charges:  $Therapeutic Activity: 23-37 mins                    G CodesClearnce Sorrel Izael Bessinger November 19, 2016, 11:17  AM Sherie Don, [place], DPT [phone]

## 2016-10-26 NOTE — Progress Notes (Signed)
Patient ID: Mandy Rhodes, female   DOB: Mar 09, 1941, 75 y.o.   MRN: [id number]    Referring Physician(s): Dr. Merrie Roof  Supervising Physician: Corrie Mckusick  Patient Status: [facility] - In-pt  Chief Complaint: Dysphagia secondary to laryngeal mass  Subjective: Patient with no complaints except some soreness, but no pain.  Already using G-tube.  Allergies: Sulfa antibiotics  Medications: Prior to Admission medications   Medication Sig Start Date End Date Taking? Authorizing Provider  aspirin EC 81 MG EC tablet Take 1 tablet (81 mg total) by mouth daily. 03/23/16  Yes Shanker Kristeen Mans, MD  Calcium Carbonate-Vit D-Min (CALCIUM 1200 PO) Take 600 mg by mouth daily.    Yes Historical Provider, MD  captopril (CAPOTEN) 12.5 MG tablet Take 1 tablet (12.5 mg total) by mouth 2 (two) times daily. 08/31/16  Yes Pixie Casino, MD  cholecalciferol (VITAMIN D) 1000 UNITS tablet Take 1,000 Units by mouth daily. Reported on 03/27/2016   Yes Historical Provider, MD  docusate sodium (COLACE) 100 MG capsule Take 100 mg by mouth daily as needed for mild constipation.   Yes Historical Provider, MD  ferrous sulfate 325 (65 FE) MG EC tablet Take 325 mg by mouth daily.    Yes Historical Provider, MD  furosemide (LASIX) 40 MG tablet Take 1 tablet (40 mg total) by mouth daily. OK TO TAKE ONE ADDITIONAL TABLET AS NEEDED FOR WEIGHT GAIN Patient taking differently: Take 40 mg by mouth every other day. OK TO TAKE ONE ADDITIONAL TABLET AS NEEDED FOR WEIGHT GAIN 09/04/16  Yes Rhonda G Barrett, PA-C  glimepiride (AMARYL) 1 MG tablet Take 1 mg by mouth daily with breakfast.  02/15/16  Yes Historical Provider, MD  Multiple Vitamin (MULTIVITAMIN) tablet Take 1 tablet by mouth daily.     Yes Historical Provider, MD  nortriptyline (PAMELOR) 10 MG capsule Take 10 mg by mouth at bedtime.   Yes Historical Provider, MD  omeprazole (PRILOSEC) 40 MG capsule Take 40 mg by mouth daily.     Yes Historical Provider, MD  potassium  chloride SA (K-DUR,KLOR-CON) 20 MEQ tablet Take 1 tablet (20 mEq total) by mouth daily. May take an extra tablet if extra furosemide is taken Patient taking differently: Take 20 mEq by mouth every other day. May take an extra tablet if extra furosemide is taken 09/04/16  Yes Pixie Casino, MD  pravastatin (PRAVACHOL) 40 MG tablet Take 40 mg by mouth daily.     Yes Historical Provider, MD  PRESCRIPTION MEDICATION 1 drop every 3 (three) months. Eye drops to be used the day before, of , and after eye injections   Yes Historical Provider, MD    Vital Signs: BP 104/72   Pulse 99   Temp 98.4 F (36.9 C) (Oral)   Resp 17   Ht 5\' 6"  (1.676 m)   Wt 203 lb 0.7 oz (92.1 kg)   SpO2 95%   BMI 32.77 kg/m   Physical Exam: Abd: soft, site is essentially NT, no bleeding, erythema, or drainage.    Imaging: Ir Gastrostomy Tube Mod Sed  Result Date: 10/25/2016 INDICATION: 75 year old female with a history of tracheal perforation and retropharyngeal abscess. Gastrostomy tube placement is required as the patient's remain NPO for an extended period of time. EXAM: Fluoroscopically guided placement of percutaneous pull-through gastrostomy tube Interventional Radiologist:  Criselda Peaches, MD MEDICATIONS: Currently receiving intravenous antibiotics during this hospital admission (Unasyn). Most recent dose administered within 1 hour of skin incision. 1 mg glucagon also administered  intravenously. ANESTHESIA/SEDATION: Versed 3 mg IV; Fentanyl 125 mcg IV Moderate Sedation Time:  45 minutes The patient was continuously monitored during the procedure by the interventional radiology nurse under my direct supervision. CONTRAST:  10 mL Isovue-300 FLUOROSCOPY TIME:  Fluoroscopy Time: 2 minutes 12 seconds (37 mGy). COMPLICATIONS: None immediate. PROCEDURE: Informed written consent was obtained from the patient after a thorough discussion of the procedural risks, benefits and alternatives. All questions were addressed.  Maximal Sterile Barrier Technique was utilized including caps, mask, sterile gowns, sterile gloves, sterile drape, hand hygiene and skin antiseptic. A timeout was performed prior to the initiation of the procedure. Maximal barrier sterile technique utilized including caps, mask, sterile gowns, sterile gloves, large sterile drape, hand hygiene, and chlorhexadine skin prep. An angled catheter was advanced over a wire under fluoroscopic guidance through the nose, down the esophagus and into the body of the stomach. The stomach was then insufflated with several 100 ml of air. Fluoroscopy confirmed location of the gastric bubble, as well as inferior displacement of the barium stained colon. Under direct fluoroscopic guidance, a single T-tack was placed, and the anterior gastric wall drawn up against the anterior abdominal wall. Percutaneous access was then obtained into the mid gastric body with an 18 gauge sheath needle. Aspiration of air, and injection of contrast material under fluoroscopy confirmed needle placement. An Amplatz wire was advanced in the gastric body and the access needle exchanged for a 9-French vascular sheath. A snare device was advanced through the vascular sheath and an Amplatz wire advanced through the angled catheter. The Amplatz wire was successfully snared and this was pulled up through the esophagus and out the mouth. A 20-French Alinda Dooms MIC-PEG tube was then connected to the snare and pulled through the mouth, down the esophagus, into the stomach and out to the anterior abdominal wall. Hand injection of contrast material confirmed intragastric location. The T-tack retention suture was then cut. The pull through peg tube was then secured with the external bumper and capped. The patient will be observed for several hours with the newly placed tube on low wall suction to evaluate for any post procedure complication. The patient tolerated the procedure well, there is no immediate  complication. IMPRESSION: Successful placement of a 20 French pull through gastrostomy tube. Electronically Signed   By: [name] M.D.   On: 10/25/2016 15:43   Dg Chest Port 1 View  Result Date: 10/26/2016 CLINICAL DATA:  75 year old female line placement. Initial encounter. EXAM: PORTABLE CHEST 1 VIEW COMPARISON:  10/21/2016 and earlier. FINDINGS: Portable AP semi upright view at 1237 hours. Right upper extremity approach PICC line catheter now in place. Tip projects at the cavoatrial junction level. Stable cardiomegaly and mediastinal contours. Dense retrocardiac opacification and obscuration of the left hemidiaphragm. Mildly decreased pulmonary vascularity since 10/21/2016. No pneumothorax. Possible small left pleural effusion. IMPRESSION: 1. Right PICC line in place, tip at the cavoatrial junction level. 2. Left greater than right lower lobe collapse or consolidation. Probable small left pleural effusion. 3. Mildly decreased pulmonary vascular congestion. Electronically Signed   By: Genevie Ann M.D.   On: 10/26/2016 12:54    Labs:  CBC:  Recent Labs  10/22/16 0434 10/23/16 0455 10/25/16 0459 10/25/16 1112  WBC 4.9 4.6 3.0* 3.4*  HGB 8.2* 8.0* 7.5* 8.2*  HCT 25.6* 25.3* 23.2* 25.3*  PLT 168 225 109* 220    COAGS:  Recent Labs  09/25/16 1202 10/13/16 0058 10/24/16 0223  INR 1.31 1.22 1.25  APTT  30 32  --     BMP:  Recent Labs  10/23/16 0455 10/24/16 0223 10/25/16 0459 10/26/16 0211  NA 139 141 140 143  K 3.5 3.4* 3.4* 3.0*  CL 108 109 109 112*  CO2 24 25 23 25   GLUCOSE 98 97 110* 82  BUN 17 13 10 8   CALCIUM 8.5* 8.8* 8.5* 8.7*  CREATININE 1.59* 1.47* 1.30* 1.28*  GFRNONAA 31* 34* 39* 40*  GFRAA 36* 39* 45* 46*    LIVER FUNCTION TESTS:  Recent Labs  03/17/16 1818 10/12/16 2302  10/23/16 0455 10/24/16 0223 10/25/16 0459 10/26/16 0211  BILITOT 0.6 1.0  --   --   --  0.5  --   AST 21 33  --   --   --  19  --   ALT 20 30  --   --   --  25  --     ALKPHOS 76 97  --   --   --  68  --   PROT 7.2 8.0  --   --   --  5.3*  --   ALBUMIN 4.0 3.3*  < > 1.8* 1.8* 1.8* 1.8*  < > = values in this interval not displayed.  Assessment and Plan: 1. Dysphagia, s/p g-tube placement 12/6 -site is clean and the tube is already being used -cont to use tube as necessary and make sure to flush the tube as ordered. -call with questions.  Electronically Signed: Henreitta Cea 10/26/2016, 1:02 PM   I spent a total of 15 Minutes at the the patient's bedside AND on the patient's hospital floor or unit, greater than 50% of which was counseling/coordinating care for dysphagia

## 2016-10-26 NOTE — Progress Notes (Signed)
Report given to 5W RN, Jenny Reichmann.

## 2016-10-26 NOTE — Progress Notes (Signed)
Mandy Rhodes [id number] DOB: 1941-04-03 DOA: 10/12/2016 PCP: Vidal Schwalbe, MD  Brief narrative: 75 y/o ? Dm ty ii ckd 2-3 htn Iron def anemia sys HF diagnosed 03/2016 EF35-40%, NYHA class III  Cath at the time 03/22/16-non-ob CAD ? Amyloid  Admitted by [facility] 10/13/16 Supraglottic mass noted-ENt consulted 11.28 and 2 x 2cm exposed cervical fusion plate noted + PUS Neurosurg consulted for recs GI consulted for feeding recs-rec G tube ID following with IV ABx  Past medical history-As per Problem list Chart reviewed as below-   Consultants:  reviewwed  Procedures:  Ct neck 11/24  Ct head 11/24  Tee 11/24  Direct laryngoscopy 11/28  SIGNIFICANT EVENTS: 11/24 - intubated, admitted to ICU. 11/28 Now back from surgery, C-plate perforated thru esophagus 11/29- OR for removal hardware   LINES/TUBES: OETT 7.5 11/23 >>12/1 OGT 11/23 >>out FOLEY 11/23 >> PIV  Antibiotics: ANTIBIOTICS: Unasyn 11/24 >>  vanc 11/28>>>12/2   Subjective   Well no new issues PEg placed 12/7 PICC pending still this am CBG 69 but asymptomatic No n/v Mild abd pain   Objective    Interim History:   Telemetry: Sinus pvc   Objective: Vitals:   10/26/16 0500 10/26/16 0754 10/26/16 0757 10/26/16 0829  BP: 105/66   104/67  Pulse: 99   100  Resp: 14   14  Temp:    98.4 F (36.9 C)  TempSrc:    Oral  SpO2: 100% 99% 99% 96%  Weight: 92.1 kg (203 lb 0.7 oz)     Height:        Intake/Output Summary (Last 24 hours) at 10/26/16 0850 Last data filed at 10/26/16 0546  Gross per 24 hour  Intake             1350 ml  Output              475 ml  Net              875 ml    Exam:  General: eomi ncat, ne3ck wound covered with dressing-appears clean on exam with scab Cardiovascular: s1 s 2no m/r/g Respiratory:  Clear no added sound Abdomen:  Soft nt nd  Skin no le edema Neuro intat move 4 limbs equally  Data Reviewed: Basic Metabolic Panel:  Recent Labs Lab  10/20/16 1217 10/20/16 1821 10/21/16 0446 10/21/16 1625 10/22/16 0434 10/23/16 0455 10/24/16 0223 10/25/16 0459 10/26/16 0211  NA  --   --  141  --  142 139 141 140 143  K  --   --  3.6  --  3.4* 3.5 3.4* 3.4* 3.0*  CL  --   --  112*  --  110 108 109 109 112*  CO2  --   --  24  --  23 24 25 23 25   GLUCOSE  --   --  125*  --  97 98 97 110* 82  BUN  --   --  24*  --  20 17 13 10 8   CREATININE  --   --  1.84*  --  1.71* 1.59* 1.47* 1.30* 1.28*  CALCIUM  --   --  8.7*  --  8.8* 8.5* 8.8* 8.5* 8.7*  MG 2.0 2.0 2.0 2.1  --   --   --  1.8  --   PHOS 3.0 3.4 3.4  3.4 3.3 3.5 3.1 2.7 2.7 2.9   Liver Function Tests:  Recent Labs Lab 10/22/16 0434 10/23/16 0455 10/24/16 0223 10/25/16  [id number] 10/26/16 0211  AST  --   --   --  19  --   ALT  --   --   --  25  --   ALKPHOS  --   --   --  68  --   BILITOT  --   --   --  0.5  --   PROT  --   --   --  5.3*  --   ALBUMIN 1.7* 1.8* 1.8* 1.8* 1.8*   No results for input(s): LIPASE, AMYLASE in the last 168 hours. No results for input(s): AMMONIA in the last 168 hours. CBC:  Recent Labs Lab 10/21/16 0446 10/22/16 0434 10/23/16 0455 10/25/16 0459 10/25/16 1112  WBC 7.2 4.9 4.6 3.0* 3.4*  NEUTROABS 5.3  --   --   --  2.2  HGB 8.0* 8.2* 8.0* 7.5* 8.2*  HCT 25.0* 25.6* 25.3* 23.2* 25.3*  MCV 88.7 89.2 89.1 88.5 88.5  PLT 153 168 225 109* 220   Cardiac Enzymes: No results for input(s): CKTOTAL, CKMB, CKMBINDEX, TROPONINI in the last 168 hours. BNP: Invalid input(s): POCBNP CBG:  Recent Labs Lab 10/25/16 1607 10/25/16 1920 10/26/16 0115 10/26/16 0543 10/26/16 0828  GLUCAP 115* 85 76 83 69    Recent Results (from the past 240 hour(s))  Aerobic/Anaerobic Culture (surgical/deep wound)     Status: None   Collection Time: 10/20/16 10:28 AM  Result Value Ref Range Status   Specimen Description JP DRAINAGE NECK  Final   Special Requests Normal  Final   Gram Stain   Final    RARE WBC PRESENT, PREDOMINANTLY PMN NO ORGANISMS  SEEN    Culture No growth aerobically or anaerobically.  Final   Report Status 10/25/2016 FINAL  Final     Studies:              All Imaging reviewed and is as per above notation   Scheduled Meds: . ampicillin-sulbactam (UNASYN) IV  3 g Intravenous Q8H  . chlorhexidine  15 mL Mouth Rinse BID  . heparin subcutaneous  5,000 Units Subcutaneous Q8H  . insulin aspart  0-9 Units Subcutaneous Q4H  . ipratropium  0.5 mg Nebulization TID  . levalbuterol  0.63 mg Nebulization TID  . mouth rinse  15 mL Mouth Rinse q12n4p  . pantoprazole (PROTONIX) IV  40 mg Intravenous Q24H  . potassium chloride  40 mEq Oral Daily   Continuous Infusions: . dextrose 5 % and 0.45% NaCl 50 mL/hr at 10/24/16 0134  . feeding supplement (JEVITY 1.2 CAL) 1,000 mL (10/26/16 0844)     Assessment/Plan:  1. Infected cervical hardware with retropharyngeal abscess-status post exploration and irrigation debridement of prevertebral abscess and removal of hardware 10/18/2016. Infectious disease states Unasyn every 8 hourly til 11/07/16 Repeat CBC plus differential in a.m.  Picc line ordered 12/6.  Neurosurgery recommends 2 week follow up as OP.  Daily dry dressings till then 2. Dysphagia secondary to infected at night -Will not be able to eat for the time being therefore PEG tube for feeding short-term has been requested. Nutritionist input sought regarding care--start 12/7.  Get MAg/Phos and calcium in am 3. Acute prior hypercarbic respiratory failure, was intubated initially next dated 10/20/2016. Desaturation screen, Cont IS.  Doesn't use oxygen at home.   4. Decubitus sacral stage 2-turn q 2 hr.  ambulate 5. Non-ST EMI, SVT, hypotensive,? Amyloidosis-monitor on telemetry-hold Lasix 40 daily, captopril 12.5 2 times a day for now 6. History of smoking, COPD, continue  Spiriva nebulizations 3 times a day and leave albuterol 3 times a day 7. History diabetes mellitus type II on oral medication-Amaryl 1 mg every morning has  been held she was slightly hypoglycemic overnight 10/23/16 covering currently with sliding scale coverage-cbg ranges 80-87.  Slight low 12/7 without symptoms so monitro closely 8. GERD-continue Protonix 40 every 24 hours 9. Depression continue Pamelor when able 10 mg daily   transfer to med surg am Called daughter but no answer updated Likely d/c to SNF in am 10/26/16 if all stable, PICC placed etc  Verneita Griffes, MD  [facility] Pager [phone] 10/26/2016, 8:50 AM    LOS: 13 days

## 2016-10-26 NOTE — Progress Notes (Signed)
Peripherally Inserted Central Catheter/Midline Placement  The IV Nurse has discussed with the patient and/or persons authorized to consent for the patient, the purpose of this procedure and the potential benefits and risks involved with this procedure.  The benefits include less needle sticks, lab draws from the catheter, and the patient may be discharged home with the catheter. Risks include, but not limited to, infection, bleeding, blood clot (thrombus formation), and puncture of an artery; nerve damage and irregular heartbeat and possibility to perform a PICC exchange if needed/ordered by physician.  Alternatives to this procedure were also discussed.  Bard Power PICC patient education guide, fact sheet on infection prevention and patient information card has been provided to patient /or left at bedside.    PICC/Midline Placement Documentation        Synthia Innocent 10/26/2016, 12:10 PM

## 2016-10-27 LAB — RENAL FUNCTION PANEL
ALBUMIN: 1.7 g/dL — AB (ref 3.5–5.0)
Anion gap: 7 (ref 5–15)
BUN: 7 mg/dL (ref 6–20)
CALCIUM: 8.5 mg/dL — AB (ref 8.9–10.3)
CO2: 26 mmol/L (ref 22–32)
Chloride: 109 mmol/L (ref 101–111)
Creatinine, Ser: 1.23 mg/dL — ABNORMAL HIGH (ref 0.44–1.00)
GFR calc Af Amer: 48 mL/min — ABNORMAL LOW (ref >60)
GFR calc non Af Amer: 42 mL/min — ABNORMAL LOW (ref >60)
GLUCOSE: 150 mg/dL — AB (ref 65–99)
PHOSPHORUS: 2.2 mg/dL — AB (ref 2.5–4.6)
POTASSIUM: 3.3 mmol/L — AB (ref 3.5–5.1)
SODIUM: 142 mmol/L (ref 135–145)

## 2016-10-27 LAB — COMPREHENSIVE METABOLIC PANEL
ALT: 19 U/L (ref 14–54)
AST: 15 U/L (ref 15–41)
Albumin: 1.8 g/dL — ABNORMAL LOW (ref 3.5–5.0)
Alkaline Phosphatase: 71 U/L (ref 38–126)
Anion gap: 9 (ref 5–15)
BILIRUBIN TOTAL: 0.6 mg/dL (ref 0.3–1.2)
BUN: 8 mg/dL (ref 6–20)
CHLORIDE: 108 mmol/L (ref 101–111)
CO2: 24 mmol/L (ref 22–32)
CREATININE: 1.23 mg/dL — AB (ref 0.44–1.00)
Calcium: 8.6 mg/dL — ABNORMAL LOW (ref 8.9–10.3)
GFR, EST AFRICAN AMERICAN: 48 mL/min — AB (ref >60)
GFR, EST NON AFRICAN AMERICAN: 42 mL/min — AB (ref >60)
Glucose, Bld: 150 mg/dL — ABNORMAL HIGH (ref 65–99)
Potassium: 3.2 mmol/L — ABNORMAL LOW (ref 3.5–5.1)
Sodium: 141 mmol/L (ref 135–145)
TOTAL PROTEIN: 5.6 g/dL — AB (ref 6.5–8.1)

## 2016-10-27 LAB — PHOSPHORUS: PHOSPHORUS: 2.1 mg/dL — AB (ref 2.5–4.6)

## 2016-10-27 LAB — GLUCOSE, CAPILLARY
GLUCOSE-CAPILLARY: 193 mg/dL — AB (ref 65–99)
Glucose-Capillary: 135 mg/dL — ABNORMAL HIGH (ref 65–99)
Glucose-Capillary: 166 mg/dL — ABNORMAL HIGH (ref 65–99)
Glucose-Capillary: 179 mg/dL — ABNORMAL HIGH (ref 65–99)

## 2016-10-27 LAB — MAGNESIUM: MAGNESIUM: 1.8 mg/dL (ref 1.7–2.4)

## 2016-10-27 MED ORDER — HEPARIN SOD (PORK) LOCK FLUSH 100 UNIT/ML IV SOLN
250.0000 [IU] | INTRAVENOUS | Status: AC | PRN
  Administered 2016-10-27: 250 [IU]

## 2016-10-27 MED ORDER — JEVITY 1.2 CAL PO LIQD: 1000.0000 mL | ORAL | 0 refills | Status: DC

## 2016-10-27 MED ORDER — GLIMEPIRIDE 1 MG PO TABS: 0.5000 mg | ORAL_TABLET | Freq: Every day | ORAL | 0 refills | Status: DC

## 2016-10-27 MED ORDER — AMPICILLIN-SULBACTAM IV (FOR PTA / DISCHARGE USE ONLY): 3.0000 g | Freq: Three times a day (TID) | INTRAVENOUS | 0 refills | Status: AC

## 2016-10-27 NOTE — Care Management Important Message (Signed)
Important Message  Patient Details  Name: Mandy Rhodes MRN: [id number] Date of Birth: 06/28/1941   Medicare Important Message Given:  Yes    [name] May 10/27/2016, 12:27 PM

## 2016-10-27 NOTE — Progress Notes (Signed)
Pt still has no urge to void. Unclamped foley catheter.

## 2016-10-27 NOTE — Progress Notes (Signed)
Patient will discharge to [facility] Anticipated discharge date: 12/8 Family notified: [name] Transportation by Corey Harold- called at 11:25am  [place] signing off.  Jorge Ny, LCSW Clinical Social Worker [phone]

## 2016-10-27 NOTE — Clinical Social Work Placement (Signed)
   CLINICAL SOCIAL WORK PLACEMENT  NOTE  Date:  10/27/2016  Patient Details  Name: Mandy Rhodes MRN: [id number] Date of Birth: 10/07/1941  Clinical Social Work is seeking post-discharge placement for this patient at the [place] level of care (*CSW will initial, date and re-position this form in  chart as items are completed):  Yes   Patient/family provided with [place] Work Department's list of facilities offering this level of care within the geographic area requested by the patient (or if unable, by the patient's family).  Yes   Patient/family informed of their freedom to choose among providers that offer the needed level of care, that participate in Medicare, Medicaid or managed care program needed by the patient, have an available bed and are willing to accept the patient.  Yes   Patient/family informed of [facility]'s ownership interest in [facility] and [facility], as well as of the fact that they are under no obligation to receive care at these facilities.  PASRR submitted to EDS on 10/24/16     PASRR number received on 10/24/16     Existing PASRR number confirmed on       FL2 transmitted to all facilities in geographic area requested by pt/family on 10/24/16     FL2 transmitted to all facilities within larger geographic area on       Patient informed that his/her managed care company has contracts with or will negotiate with certain facilities, including the following:        Yes   Patient/family informed of bed offers received.  Patient chooses bed at [place] recommends and patient chooses bed at      Patient to be transferred to [facility] on 10/27/16.  Patient to be transferred to facility by [facility]     Patient family notified on 10/27/16 of transfer.  Name of family member notified:  [name]     PHYSICIAN Please sign FL2     Additional Comment:     _______________________________________________ Jorge Ny, LCSW 10/27/2016, 11:28 AM

## 2016-10-27 NOTE — Discharge Summary (Signed)
Physician Discharge Summary  Mandy Rhodes IFO:[id number] DOB: 11-22-1940 DOA: 10/12/2016  PCP: Vidal Schwalbe, MD  Admit date: 10/12/2016 Discharge date: 10/27/2016  Time spent: 45 minutes  Recommendations for Outpatient Follow-up:  1. Needs labs as per OP pharmacy protocol-Unasyn tid till 12/19 2. Will need OP follow-up with Dr. Vernice Jefferson of Neurosurgery 11/22/2016 at 13:45 pm-skilled facility to coordinate transfer to ofice for appt 3. Continue feeds via PEG  Discharge Diagnoses:  Principal Problem:   Sepsis ([place]) Active Problems:   Essential hypertension   Type 2 diabetes mellitus with stage 3 chronic kidney disease, without long-term current use of insulin (HCC)   Chronic combined systolic and diastolic heart failure (HCC)   Non-ischemic cardiomyopathy (HCC)   Acute hypercapnic respiratory failure (HCC)   Acute kidney injury superimposed on CKD (HCC)   Pressure injury of skin   PAT (paroxysmal atrial tachycardia) (HCC)   Malnutrition of moderate degree   Laryngeal mass   Hyperosmolality and/or hypernatremia   Anemia   Hypokalemia   Demand ischemia (HCC)   CAP (community acquired pneumonia)   Acute encephalopathy   Vertebral osteomyelitis ([place])   Esophageal perforation   Discharge Condition: improved  Diet recommendation: peg feeds  Filed Weights   10/25/16 0500 10/26/16 0500 10/27/16 0538  Weight: 92 kg (202 lb 13.2 oz) 92.1 kg (203 lb 0.7 oz) 92.4 kg (203 lb 11.3 oz)    History of present illness:  75 y/o ? Dm ty ii ckd 2-3 htn Iron def anemia sys HF diagnosed 03/2016 EF35-40%, NYHA class III             Cath at the time 03/22/16-non-ob CAD ? Amyloid  Admitted by [facility] 10/13/16 Supraglottic mass noted-ENt consulted 11.28 and 2 x 2cm exposed cervical fusion plate noted + PUS Neurosurg consulted for recs GI consulted for feeding recs-rec G tube ID consulted and saw for IV ABx  Hospital Course:  Assessment/Plan:  1. Infected cervical hardware with  retropharyngeal abscess-status post exploration and irrigation debridement of prevertebral abscess and removal of hardware 10/18/2016. Infectious disease states Unasyn every 8 hourly til 11/07/16 Repeat CBC plus differential in a.m.  Picc line ordered 12/6.  Neurosurgery recommends 2 week follow up as OP.  Daily dry dressings with dry dressings 2. Dysphagia secondary to infected at night -Will not be able to eat for the time being therefore PEG tube for feeding short-term has been requested. Nutritionist input sought regarding care--start 12/7.  Get MAg/Phos and calcium every 3-4 days and supplement where needed 3. Acute prior hypercarbic respiratory failure, was intubated initially next dated 10/20/2016. Desaturation screen, Cont IS.  Doesn't use oxygen at home.   4. Decubitus sacral stage 2-turn q 2 hr.  ambulate 5. Non-ST EMI, SVT, hypotensive,? Amyloidosis-monitor on telemetry-hold Lasix 40 daily, captopril 12.5 2 times a day for now--these were held at d/c and might need adjustment 6. History of smoking, COPD, continue Spiriva nebulizations 3 times a day and leave albuterol 3 times a day 7. History diabetes mellitus type II on oral medication-Amaryl 1 mg every morning has been held she was slightly hypoglycemic overnight 10/23/16 covering currently with sliding scale coverage-cbg ranges 80-87.  Slight low 12/7 without symptoms-resumed Amaryl at 0.5 mg daily on d/c 8. GERD-continue Protonix 40 every 24 hours 9. Depression continue Pamelor when able 10 mg daily  Procedures: Multiple  Consultations:  Neurosurgery   ID  GI  Discharge Exam: Vitals:   10/26/16 2054 10/27/16 0628  BP: (!) 98/57 99/63  Pulse: (!) 101 (!) 101  Resp: 18 18  Temp: 99.3 F (37.4 C) 98.7 F (37.1 C)    General: aert pleasant oriented in nad.  tol feeds Cardiovascular: s1 s2 no m/r/g Respiratory: clear no added sound No LE edema Skin around neck incision covered but appears clean to cursory look PEG tube  site clean  Discharge Instructions    Current Discharge Medication List    START taking these medications   Details  ampicillin-sulbactam (UNASYN) IVPB Inject 3 g into the vein every 8 (eight) hours. Indication:  Retropharyngeal abscess Last Day of Therapy:  11/07/16 Labs - Once weekly:  CBC/D and BMP, Labs - Every other week:  ESR and CRP Qty: 37 Units, Refills: 0    Nutritional Supplements (FEEDING SUPPLEMENT, JEVITY 1.2 CAL,) LIQD Place 1,000 mLs into feeding tube continuous. Qty: 1000 mL, Refills: 0      CONTINUE these medications which have CHANGED   Details  glimepiride (AMARYL) 1 MG tablet Take 0.5 tablets (0.5 mg total) by mouth daily with breakfast. Qty: 10 tablet, Refills: 0      CONTINUE these medications which have NOT CHANGED   Details  aspirin EC 81 MG EC tablet Take 1 tablet (81 mg total) by mouth daily. Qty: 30 tablet, Refills: 0    Calcium Carbonate-Vit D-Min (CALCIUM 1200 PO) Take 600 mg by mouth daily.     cholecalciferol (VITAMIN D) 1000 UNITS tablet Take 1,000 Units by mouth daily. Reported on 03/27/2016    ferrous sulfate 325 (65 FE) MG EC tablet Take 325 mg by mouth daily.     Multiple Vitamin (MULTIVITAMIN) tablet Take 1 tablet by mouth daily.      nortriptyline (PAMELOR) 10 MG capsule Take 10 mg by mouth at bedtime.    omeprazole (PRILOSEC) 40 MG capsule Take 40 mg by mouth daily.      potassium chloride SA (K-DUR,KLOR-CON) 20 MEQ tablet Take 1 tablet (20 mEq total) by mouth daily. May take an extra tablet if extra furosemide is taken Qty: 30 tablet, Refills: 3    pravastatin (PRAVACHOL) 40 MG tablet Take 40 mg by mouth daily.      PRESCRIPTION MEDICATION 1 drop every 3 (three) months. Eye drops to be used the day before, of , and after eye injections      STOP taking these medications     captopril (CAPOTEN) 12.5 MG tablet      docusate sodium (COLACE) 100 MG capsule      furosemide (LASIX) 40 MG tablet        Allergies  Allergen  Reactions  . Sulfa Antibiotics Swelling      The results of significant diagnostics from this hospitalization (including imaging, microbiology, ancillary and laboratory) are listed below for reference.    Significant Diagnostic Studies: Dg Abd 1 View  Result Date: 10/13/2016 CLINICAL DATA:  Enteric tube placement. EXAM: ABDOMEN - 1 VIEW COMPARISON:  None. FINDINGS: Normal bowel gas pattern. Enteric tube tip projects over the mid stomach. Multilevel degenerative changes of the lumbar spine. Moderate bilateral hip osteoarthrosis. IMPRESSION: Enteric tube tip projects over mid stomach. Electronically Signed   By: Kristine Garbe M.D.   On: 10/13/2016 05:30   Ct Head Wo Contrast  Result Date: 10/13/2016 CLINICAL DATA:  Unresponsive patient. Patient complained of breath and generalized weakness for 2 weeks. EXAM: CT HEAD WITHOUT CONTRAST TECHNIQUE: Contiguous axial images were obtained from the base of the skull through the vertex without intravenous contrast. COMPARISON:  None. FINDINGS:  Brain: No evidence of acute infarction, hemorrhage, hydrocephalus, extra-axial collection or mass lesion/mass effect. There is moderate brain parenchymal volume loss and symmetric bilateral microangiopathy. Vascular: Atherosclerotic disease at the skullbase. Skull: Normal. Negative for fracture or focal lesion. Sinuses/Orbits: No acute finding. Other: None. IMPRESSION: No acute intracranial abnormality. Moderate brain parenchymal atrophy and chronic microvascular disease. Electronically Signed   By: Fidela Salisbury M.D.   On: 10/13/2016 12:35   Ct Soft Tissue Neck Wo Contrast  Addendum Date: 10/13/2016   ADDENDUM REPORT: 10/13/2016 04:53 ADDENDUM: These results were called by telephone at the time of interpretation on 10/13/2016 at 4:53 am to Dr. Jimmy Footman, who verbally acknowledged these results. Electronically Signed   By: Kristine Garbe M.D.   On: 10/13/2016 04:53   Result Date:  10/13/2016 CLINICAL DATA:  75 y/o F; shortness of breath and generalized weakness for 2 weeks. Evaluation of supraglottic mass seen on intubation. EXAM: CT NECK AND CHEST WITHOUT CONTRAST COMPARISON:  07/15/2015 CT of cervical spine. FINDINGS: CT NECK FINDINGS Pharynx and larynx: There is fluid and debris throughout the oropharynx, hypopharynx, and supraglottic airway due to intubation and nasoenteric tube. Addition, the region is obscured by artifact from cervical fusion hardware and the endotracheal tube. No soft tissue mass can be distinguished. Laryngeal cartilage appears intact. Subglottic airway is patent. Salivary glands: No inflammation, mass, or stone. Thyroid: Normal. Lymph nodes: None enlarged or abnormal density. Vascular: Calcific atherosclerosis of bilateral carotid bifurcations. Limited intracranial: Negative. Visualized orbits: Not within the field of view Mastoids and visualized paranasal sinuses: Normally pneumatized mastoid air cells and visualized sinuses. Skeleton: Anterior cervical fusion with plate and screws from C3 through C6. Multilevel uncovertebral and facet hypertrophy with severe bony foraminal narrowing through the levels of fusion 1. C6-7 disc osteophyte complex with moderate to severe canal stenosis. C2-3 disc osteophyte complex with at least moderate canal stenosis. No acute osseous abnormality is identified. Flowing anterior ossification of upper thoracic spine may represent DISH. CT CHEST FINDINGS Cardiovascular: Mild cardiomegaly. No pericardial effusion. Moderate coronary artery calcification. Normal caliber thoracic aorta. Borderline enlarged main pulmonary artery. Mediastinum/Nodes: No enlarged mediastinal or axillary lymph nodes. Thyroid gland, trachea, and esophagus demonstrate no significant findings. Endotracheal tube is less than 1 cm from the carina. Lungs/Pleura: There is patchy nodular consolidation within the bilateral upper and right lower lobe and a dense  consolidation within the left lower lobe with air bronchograms. Small left pleural effusion. Upper Abdomen: Enteric tube tip below the field of view in the stomach. No acute process. Musculoskeletal: Flowing anterior ossification of vertebral bodies consistent with DISH. No acute osseous abnormality. IMPRESSION: 1. Fluid and debris throughout the oropharynx, hypopharynx, and supraglottic airway from intubation. Suboptimal evaluation of the mucosa due to airway opacification and lack of contrast. Follow-up contrast examination after extubation and/or direct visualization recommended for evaluation of the supraglottic mass. 2. No cervical lymphadenopathy identified. 3. Left lower lobe consolidation and nodular opacities throughout the lungs compatible with pneumonia. Small left pleural effusion. 4. Endotracheal tube 1 cm from carina, retraction recommended. 5. Enteric tube tip below the field of view in the stomach. 6. Moderate coronary artery calcifications. 7. Borderline main pulmonary artery may represent pulmonary artery hypertension. Electronically Signed: By: Kristine Garbe M.D. On: 10/13/2016 04:36   Ct Chest Wo Contrast  Addendum Date: 10/13/2016   ADDENDUM REPORT: 10/13/2016 04:53 ADDENDUM: These results were called by telephone at the time of interpretation on 10/13/2016 at 4:53 am to Dr. Jimmy Footman, who verbally acknowledged these results. Electronically  Signed   By: Kristine Garbe M.D.   On: 10/13/2016 04:53   Result Date: 10/13/2016 CLINICAL DATA:  75 y/o F; shortness of breath and generalized weakness for 2 weeks. Evaluation of supraglottic mass seen on intubation. EXAM: CT NECK AND CHEST WITHOUT CONTRAST COMPARISON:  07/15/2015 CT of cervical spine. FINDINGS: CT NECK FINDINGS Pharynx and larynx: There is fluid and debris throughout the oropharynx, hypopharynx, and supraglottic airway due to intubation and nasoenteric tube. Addition, the region is obscured by artifact from  cervical fusion hardware and the endotracheal tube. No soft tissue mass can be distinguished. Laryngeal cartilage appears intact. Subglottic airway is patent. Salivary glands: No inflammation, mass, or stone. Thyroid: Normal. Lymph nodes: None enlarged or abnormal density. Vascular: Calcific atherosclerosis of bilateral carotid bifurcations. Limited intracranial: Negative. Visualized orbits: Not within the field of view Mastoids and visualized paranasal sinuses: Normally pneumatized mastoid air cells and visualized sinuses. Skeleton: Anterior cervical fusion with plate and screws from C3 through C6. Multilevel uncovertebral and facet hypertrophy with severe bony foraminal narrowing through the levels of fusion 1. C6-7 disc osteophyte complex with moderate to severe canal stenosis. C2-3 disc osteophyte complex with at least moderate canal stenosis. No acute osseous abnormality is identified. Flowing anterior ossification of upper thoracic spine may represent DISH. CT CHEST FINDINGS Cardiovascular: Mild cardiomegaly. No pericardial effusion. Moderate coronary artery calcification. Normal caliber thoracic aorta. Borderline enlarged main pulmonary artery. Mediastinum/Nodes: No enlarged mediastinal or axillary lymph nodes. Thyroid gland, trachea, and esophagus demonstrate no significant findings. Endotracheal tube is less than 1 cm from the carina. Lungs/Pleura: There is patchy nodular consolidation within the bilateral upper and right lower lobe and a dense consolidation within the left lower lobe with air bronchograms. Small left pleural effusion. Upper Abdomen: Enteric tube tip below the field of view in the stomach. No acute process. Musculoskeletal: Flowing anterior ossification of vertebral bodies consistent with DISH. No acute osseous abnormality. IMPRESSION: 1. Fluid and debris throughout the oropharynx, hypopharynx, and supraglottic airway from intubation. Suboptimal evaluation of the mucosa due to airway  opacification and lack of contrast. Follow-up contrast examination after extubation and/or direct visualization recommended for evaluation of the supraglottic mass. 2. No cervical lymphadenopathy identified. 3. Left lower lobe consolidation and nodular opacities throughout the lungs compatible with pneumonia. Small left pleural effusion. 4. Endotracheal tube 1 cm from carina, retraction recommended. 5. Enteric tube tip below the field of view in the stomach. 6. Moderate coronary artery calcifications. 7. Borderline main pulmonary artery may represent pulmonary artery hypertension. Electronically Signed: By: Kristine Garbe M.D. On: 10/13/2016 04:36   Ir Gastrostomy Tube Mod Sed  Result Date: 10/25/2016 INDICATION: 75 year old female with a history of tracheal perforation and retropharyngeal abscess. Gastrostomy tube placement is required as the patient's remain NPO for an extended period of time. EXAM: Fluoroscopically guided placement of percutaneous pull-through gastrostomy tube Interventional Radiologist:  Criselda Peaches, MD MEDICATIONS: Currently receiving intravenous antibiotics during this hospital admission (Unasyn). Most recent dose administered within 1 hour of skin incision. 1 mg glucagon also administered intravenously. ANESTHESIA/SEDATION: Versed 3 mg IV; Fentanyl 125 mcg IV Moderate Sedation Time:  45 minutes The patient was continuously monitored during the procedure by the interventional radiology nurse under my direct supervision. CONTRAST:  10 mL Isovue-300 FLUOROSCOPY TIME:  Fluoroscopy Time: 2 minutes 12 seconds (37 mGy). COMPLICATIONS: None immediate. PROCEDURE: Informed written consent was obtained from the patient after a thorough discussion of the procedural risks, benefits and alternatives. All questions were addressed. Maximal Sterile  Barrier Technique was utilized including caps, mask, sterile gowns, sterile gloves, sterile drape, hand hygiene and skin antiseptic. A  timeout was performed prior to the initiation of the procedure. Maximal barrier sterile technique utilized including caps, mask, sterile gowns, sterile gloves, large sterile drape, hand hygiene, and chlorhexadine skin prep. An angled catheter was advanced over a wire under fluoroscopic guidance through the nose, down the esophagus and into the body of the stomach. The stomach was then insufflated with several 100 ml of air. Fluoroscopy confirmed location of the gastric bubble, as well as inferior displacement of the barium stained colon. Under direct fluoroscopic guidance, a single T-tack was placed, and the anterior gastric wall drawn up against the anterior abdominal wall. Percutaneous access was then obtained into the mid gastric body with an 18 gauge sheath needle. Aspiration of air, and injection of contrast material under fluoroscopy confirmed needle placement. An Amplatz wire was advanced in the gastric body and the access needle exchanged for a 9-French vascular sheath. A snare device was advanced through the vascular sheath and an Amplatz wire advanced through the angled catheter. The Amplatz wire was successfully snared and this was pulled up through the esophagus and out the mouth. A 20-French Alinda Dooms MIC-PEG tube was then connected to the snare and pulled through the mouth, down the esophagus, into the stomach and out to the anterior abdominal wall. Hand injection of contrast material confirmed intragastric location. The T-tack retention suture was then cut. The pull through peg tube was then secured with the external bumper and capped. The patient will be observed for several hours with the newly placed tube on low wall suction to evaluate for any post procedure complication. The patient tolerated the procedure well, there is no immediate complication. IMPRESSION: Successful placement of a 20 French pull through gastrostomy tube. Electronically Signed   By: [name] M.D.   On: 10/25/2016  15:43   Dg Chest Port 1 View  Result Date: 10/26/2016 CLINICAL DATA:  75 year old female line placement. Initial encounter. EXAM: PORTABLE CHEST 1 VIEW COMPARISON:  10/21/2016 and earlier. FINDINGS: Portable AP semi upright view at 1237 hours. Right upper extremity approach PICC line catheter now in place. Tip projects at the cavoatrial junction level. Stable cardiomegaly and mediastinal contours. Dense retrocardiac opacification and obscuration of the left hemidiaphragm. Mildly decreased pulmonary vascularity since 10/21/2016. No pneumothorax. Possible small left pleural effusion. IMPRESSION: 1. Right PICC line in place, tip at the cavoatrial junction level. 2. Left greater than right lower lobe collapse or consolidation. Probable small left pleural effusion. 3. Mildly decreased pulmonary vascular congestion. Electronically Signed   By: Genevie Ann M.D.   On: 10/26/2016 12:54   Dg Chest Port 1 View  Result Date: 10/21/2016 CLINICAL DATA:  Respiratory failure with fever. EXAM: PORTABLE CHEST 1 VIEW COMPARISON:  October 19, 2016 FINDINGS: Cardiomegaly and pulmonary venous congestion. Poor evaluation of the right upper lobe. A nodular density in this region is felt the likely be artifactual was not present previously. No other interval changes or acute abnormalities. IMPRESSION: Limited study with cardiomegaly and pulmonary venous congestion. Probable artifactual nodular density in the right upper lobe. Recommend PA and lateral chest x-ray before discharge. Electronically Signed   By: Dorise Bullion III M.D   On: 10/21/2016 11:19   Dg Chest Port 1 View  Result Date: 10/19/2016 CLINICAL DATA:  Check endotracheal tube placement EXAM: PORTABLE CHEST 1 VIEW COMPARISON:  10/16/2016 FINDINGS: Endotracheal tube and nasogastric catheter are noted in satisfactory  position. Cardiac shadow remains enlarged. The lungs are clear bilaterally. No bony abnormality is seen. IMPRESSION: No acute abnormality noted.  Electronically Signed   By: Inez Catalina M.D.   On: 10/19/2016 07:34   Dg Chest Port 1 View  Result Date: 10/16/2016 CLINICAL DATA:  Hypoxia EXAM: PORTABLE CHEST 1 VIEW COMPARISON:  October 16, 2016 FINDINGS: Endotracheal tube tip is 3.3 cm above the carina. No pneumothorax. No edema or consolidation. Heart is prominent with pulmonary vascularity within normal limits, stable. No adenopathy. There is extensive arthropathy in each shoulder. IMPRESSION: Endotracheal tube as described without pneumothorax. Stable cardiac prominence. No edema or consolidation. Electronically Signed   By: Lowella Grip III M.D.   On: 10/16/2016 15:12   Dg Chest Port 1 View  Result Date: 10/16/2016 CLINICAL DATA:  Pneumonia EXAM: PORTABLE CHEST 1 VIEW COMPARISON:  10/13/2016 FINDINGS: Endotracheal tube remains in place, unchanged. Interval removal of NG tube. There is cardiomegaly. Improving aeration at the left base with mild residual atelectasis or infiltrate. No confluent opacity on the right. No effusions. IMPRESSION: Cardiomegaly. Improving left base aeration with mild residual atelectasis or infiltrate. Electronically Signed   By: Rolm Baptise M.D.   On: 10/16/2016 12:07   Dg Chest Port 1 View  Result Date: 10/13/2016 CLINICAL DATA:  75 y/o  F; endotracheal tube placement. EXAM: PORTABLE CHEST 1 VIEW COMPARISON:  10/13/2016 chest CT. FINDINGS: Endotracheal tube is 2 cm from the carina. Enteric tube tip below the field of view in the abdomen. Left lower lobe consolidation. No pneumothorax. Stable cardiac silhouette. No acute osseous abnormality is evident. IMPRESSION: Endotracheal tube approximately 2 cm from carina. Left lower lobe consolidation. Electronically Signed   By: Kristine Garbe M.D.   On: 10/13/2016 05:29   Dg Chest Portable 1 View  Result Date: 10/13/2016 CLINICAL DATA:  Post intubation. EXAM: PORTABLE CHEST 1 VIEW COMPARISON:  10/12/2016 FINDINGS: Endotracheal tube is been placed with  tip measuring 3.9 cm above the carina. An enteric tube was placed. Tip is off the field of view but below the left hemidiaphragm. Shallow inspiration. Borderline heart size without vascular congestion or edema. No focal consolidation in the lungs. Degenerative changes in the shoulders. Tortuous aorta. IMPRESSION: Appliances appear in satisfactory position. Mild cardiac enlargement. No focal airspace disease or consolidation in the lungs. Electronically Signed   By: Lucienne Capers M.D.   On: 10/13/2016 01:23   Dg Chest Port 1 View  Result Date: 10/12/2016 CLINICAL DATA:  Weakness for 2 weeks. Shortness of breath and wheezing tonight. EXAM: PORTABLE CHEST 1 VIEW COMPARISON:  07/11/2016 FINDINGS: Cardiac enlargement without vascular congestion or edema. No focal consolidation in the lungs. No blunting of costophrenic angles. No pneumothorax. Degenerative changes in the spine and shoulders. IMPRESSION: Cardiac enlargement.  No evidence of active pulmonary disease. Electronically Signed   By: Lucienne Capers M.D.   On: 10/12/2016 23:55   Dg Abd Portable 1v  Result Date: 10/19/2016 CLINICAL DATA:  Evaluate orogastric tube position. EXAM: PORTABLE ABDOMEN - 1 VIEW COMPARISON:  Abdominal radiograph of October 13, 2016 FINDINGS: The proximal port of the orogastric tube lies just above the GE junction. The tip lies at the junction of the cardia and proximal body of the stomach. IMPRESSION: Advancement of the orogastric tube by a 10 cm is recommended to assure that the proximal port lies below the GE junction. Electronically Signed   By: David  Martinique M.D.   On: 10/19/2016 10:11    Microbiology: Recent Results (from the  past 240 hour(s))  Aerobic/Anaerobic Culture (surgical/deep wound)     Status: None   Collection Time: 10/20/16 10:28 AM  Result Value Ref Range Status   Specimen Description JP DRAINAGE NECK  Final   Special Requests Normal  Final   Gram Stain   Final    RARE WBC PRESENT, PREDOMINANTLY  PMN NO ORGANISMS SEEN    Culture No growth aerobically or anaerobically.  Final   Report Status 10/25/2016 FINAL  Final     Labs: Basic Metabolic Panel:  Recent Labs Lab 10/20/16 1821 10/21/16 0446 10/21/16 1625  10/23/16 0455 10/24/16 0223 10/25/16 0459 10/26/16 0211 10/27/16 0421  NA  --  141  --   < > 139 141 140 143 141  142  K  --  3.6  --   < > 3.5 3.4* 3.4* 3.0* 3.2*  3.3*  CL  --  112*  --   < > 108 109 109 112* 108  109  CO2  --  24  --   < > _0 GLUCOSE  --  125*  --   < > 98 97 110* 82 150*  150*  BUN  --  24*  --   < > _1 CREATININE  --  1.84*  --   < > 1.59* 1.47* 1.30* 1.28* 1.23*  1.23*  CALCIUM  --  8.7*  --   < > 8.5* 8.8* 8.5* 8.7* 8.6*  8.5*  MG 2.0 2.0 2.1  --   --   --  1.8  --  1.8  PHOS 3.4 3.4  3.4 3.3  < > 3.1 2.7 2.7 2.9 2.1*  2.2*  < > = values in this interval not displayed. Liver Function Tests:  Recent Labs Lab 10/23/16 0455 10/24/16 0223 10/25/16 0459 10/26/16 0211 10/27/16 0421  AST  --   --  19  --  15  ALT  --   --  25  --  19  ALKPHOS  --   --  68  --  71  BILITOT  --   --  0.5  --  0.6  PROT  --   --  5.3*  --  5.6*  ALBUMIN 1.8* 1.8* 1.8* 1.8* 1.8*  1.7*   No results for input(s): LIPASE, AMYLASE in the last 168 hours. No results for input(s): AMMONIA in the last 168 hours. CBC:  Recent Labs Lab 10/21/16 0446 10/22/16 0434 10/23/16 0455 10/25/16 0459 10/25/16 1112  WBC 7.2 4.9 4.6 3.0* 3.4*  NEUTROABS 5.3  --   --   --  2.2  HGB 8.0* 8.2* 8.0* 7.5* 8.2*  HCT 25.0* 25.6* 25.3* 23.2* 25.3*  MCV 88.7 89.2 89.1 88.5 88.5  PLT 153 168 225 109* 220   Cardiac Enzymes: No results for input(s): CKTOTAL, CKMB, CKMBINDEX, TROPONINI in the last 168 hours. BNP: BNP (last 3 results)  Recent Labs  03/18/16 0626 07/27/16 1029 10/12/16 2302  BNP 521.0* 516.1* 313.4*    ProBNP (last 3 results) No results for input(s): PROBNP in the last 8760 hours.  CBG:  Recent Labs Lab  10/26/16 1321 10/26/16 1656 10/26/16 2055 10/27/16 0016 10/27/16 0538  GLUCAP 105* 122* 135* 135* 166*       Signed:  Nita Sells MD   [facility] 10/27/2016, 8:04 AM

## 2016-10-27 NOTE — Progress Notes (Signed)
Foley clamped. Pt verbalized understanding and will call nurse if she gets the urge to void.

## 2016-10-27 NOTE — Progress Notes (Signed)
NURSING PROGRESS NOTE  MARIONNA GONIA [id number] Discharge Data: 10/27/2016 11:37 AM Attending Provider: Nita Sells, MD BJS:EGBTD,VVOHYWV S, MD     [facility] to be D/C'd Skilled nursing facility per MD order.  Discussed with the patient the After Visit Summary and all questions fully answered. All IV's discontinued with no bleeding noted. All belongings returned to patient for patient to take at SNF. Report given to Mendel Ryder, receiving nurse at [facility]. All questions answered. Nurses name and direct number provided. Pt will be transferred via PTAR by stretcher. Pt will be transferred with PICC line, PEG tube and Foley Catheter.   Last Vital Signs:  Blood pressure 99/63, pulse (!) 101, temperature 98.7 F (37.1 C), temperature source Oral, resp. rate 18, height '5\' 6"'  (1.676 m), weight 92.4 kg (203 lb 11.3 oz), SpO2 93 %.  Discharge Medication List   Medication List    STOP taking these medications   captopril 12.5 MG tablet Commonly known as:  CAPOTEN   docusate sodium 100 MG capsule Commonly known as:  COLACE   furosemide 40 MG tablet Commonly known as:  LASIX     TAKE these medications   ampicillin-sulbactam IVPB Commonly known as:  UNASYN Inject 3 g into the vein every 8 (eight) hours. Indication:  Retropharyngeal abscess Last Day of Therapy:  11/07/16 Labs - Once weekly:  CBC/D and BMP, Labs - Every other week:  ESR and CRP   aspirin 81 MG EC tablet Take 1 tablet (81 mg total) by mouth daily.   CALCIUM 1200 PO Take 600 mg by mouth daily.   cholecalciferol 1000 units tablet Commonly known as:  VITAMIN D Take 1,000 Units by mouth daily. Reported on 03/27/2016   feeding supplement (JEVITY 1.2 CAL) Liqd Place 1,000 mLs into feeding tube continuous.   ferrous sulfate 325 (65 FE) MG EC tablet Take 325 mg by mouth daily.   glimepiride 1 MG tablet Commonly known as:  AMARYL Take 0.5 tablets (0.5 mg total) by mouth daily with breakfast. What changed:  how  much to take   multivitamin tablet Take 1 tablet by mouth daily.   nortriptyline 10 MG capsule Commonly known as:  PAMELOR Take 10 mg by mouth at bedtime.   omeprazole 40 MG capsule Commonly known as:  PRILOSEC Take 40 mg by mouth daily.   potassium chloride SA 20 MEQ tablet Commonly known as:  K-DUR,KLOR-CON Take 1 tablet (20 mEq total) by mouth daily. May take an extra tablet if extra furosemide is taken What changed:  when to take this  additional instructions   pravastatin 40 MG tablet Commonly known as:  PRAVACHOL Take 40 mg by mouth daily.   PRESCRIPTION MEDICATION 1 drop every 3 (three) months. Eye drops to be used the day before, of , and after eye injections            Home Infusion Instuctions        Start     Ordered   10/27/16 0000  Home infusion instructions [facility] May follow [facility] Dosing Protocol; May administer Cathflo as needed to maintain patency of vascular access device.; Flushing of vascular access device: per [facility] Protocol: 0.9% NaCl pre/post medica...    Question Answer Comment  Instructions May follow [facility] Dosing Protocol   Instructions May administer Cathflo as needed to maintain patency of vascular access device.   Instructions Flushing of vascular access device: per [facility] Protocol: 0.9% NaCl pre/post medication administration and prn patency; Heparin  100 u/ml, 53m for implanted ports and Heparin 10u/ml, 580mfor all other central venous catheters.   Instructions May follow [facility] Anaphylaxis Protocol for First Dose Administration in the home: 0.9% NaCl at 25-50 ml/hr to maintain IV access for protocol meds. Epinephrine 0.3 ml IV/IM PRN and Benadryl 25-50 IV/IM PRN s/s of anaphylaxis.   Instructions [facility] Infusion Coordinator (RN) to assist per patient IV care needs in the home PRN.      10/27/16 [id number]     CyCharolette ChildRN

## 2016-10-31 ENCOUNTER — Non-Acute Institutional Stay (SKILLED_NURSING_FACILITY): Payer: Medicare Other | Admitting: Internal Medicine

## 2016-10-31 ENCOUNTER — Other Ambulatory Visit: Payer: Self-pay | Admitting: *Deleted

## 2016-10-31 ENCOUNTER — Encounter: Payer: Self-pay | Admitting: Internal Medicine

## 2016-10-31 DIAGNOSIS — J9602 Acute respiratory failure with hypercapnia: Secondary | ICD-10-CM | POA: Diagnosis not present

## 2016-10-31 DIAGNOSIS — A419 Sepsis, unspecified organism: Secondary | ICD-10-CM | POA: Diagnosis not present

## 2016-10-31 DIAGNOSIS — N182 Chronic kidney disease, stage 2 (mild): Secondary | ICD-10-CM

## 2016-10-31 DIAGNOSIS — E1122 Type 2 diabetes mellitus with diabetic chronic kidney disease: Secondary | ICD-10-CM | POA: Diagnosis not present

## 2016-10-31 NOTE — Assessment & Plan Note (Signed)
Unasyn will be continued until 12/19 as per ID Swelling of the right hand noted 10/31/16 does not suggest cellulitis or abscess.

## 2016-10-31 NOTE — Patient Instructions (Addendum)
See Current Assessment & Plan in Problem List under specific Diagnosis Total time 53  minutes; greater than 50% of the visit spent counseling patient and coordinating care for problems addressed at this encounter

## 2016-10-31 NOTE — Assessment & Plan Note (Signed)
Nasal O2 & incentive spirometry every 2 hours while awake

## 2016-10-31 NOTE — Progress Notes (Signed)
Nursing Home Location: [facility] Room Number: [place], [place] [address]  Code Status: Full Code   This is a comprehensive admission note to [facility] performed on this date less than 30 days from date of admission. Included are preadmission medical/surgical history;reconciled medication list; family history; social history and comprehensive review of systems.  Corrections and additions to the records were documented . Comprehensive physical exam was also performed. Additionally a clinical summary was entered for each active diagnosis pertinent to this admission in the Problem List to enhance continuity of care.  HPI: The patient was hospitalized 11/23-12/8/17 with sepsis complicated by acute respiratory failure and acute kidney injury. 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 Tracheostomy performed 11/28. She underwent exploration and irrigation with debridement of vertebral abscess and removal of the the hardware 11/29.  Course was complicated by hypotension & supraventricular tachycardia. She received pulmonary toilet for COPD exacerbation. Oral agents were held for diabetes because of hypoglycemia & followed  with sliding scale insulin. At discharge Amaryl 0.5 mg daily was reinitiated. PEG tube was inserted short-term because of dysphagia. She was found to have a decubitus sacral stage II This was in the context of diabetes with stage III chronic disease. 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 She was to receive Unasyn every 8 hours until 11/07/16 via PICC line. Neurosurgery was to follow the patient 2 weeks after discharge. Most recent labs were 10/27/16. Potassium was 3.2, creatinine 1.23, GFR 48 and glucose 150. On 12/6 HCTwas 25.3 with normocytic, normochromic indices. She had exhibited  thrombocytopenia which resolved.  Past medical and surgical history: Includes diabetic neuropathy, hypertension, GERD, and iron deficiency anemia. Surgeries include cholecystectomy and cervical spine decompression, discectomy fusion.  Social history: Quit smoking remotely.  Family history: Reviewed  Review of systems: She describes intermittent shortness of breath and intermittent production of white sputum. She's had swelling of the right hand without specific trauma. Constitutional: No fever,significant weight change, fatigue  Eyes: No redness, discharge, pain, vision change ENT/mouth: No nasal congestion,  purulent discharge, earache,change in hearing ,sore throat  Cardiovascular: No chest pain, palpitations,paroxysmal nocturnal dyspnea, claudication, edema  Respiratory: No hemoptysis, significant snoring,apnea   Gastrointestinal: No heartburn,dysphagia,abdominal pain, nausea / vomiting,rectal bleeding, melena,change in bowels Genitourinary: No dysuria,hematuria, pyuria,  incontinence, nocturia Musculoskeletal: No joint stiffness, joint swelling, weakness,pain Dermatologic: No rash, pruritus, change in appearance of skin Neurologic: No dizziness,headache,syncope, seizures, numbness , tingling Psychiatric: No significant anxiety , depression, insomnia, anorexia Endocrine: No change in hair/skin/ nails, excessive thirst, excessive hunger, excessive urination  Hematologic/lymphatic: No significant bruising, lymphadenopathy,abnormal bleeding Allergy/immunology: No itchy/ watery eyes, significant sneezing, urticaria, angioedema  Physical exam:  Pertinent or positive findings:she is an extremely pleasant & communicative individual. She has ptosis on the left with esotropia The maxilla is edentulous. She has hirsutism over her chin. Lurline Idol is healing well. Rhythm is irregular with prematures.  She has minor rhonchi in scattered distribution. PICC line present on the right. 1/2+ edema is  present. Posterior tibial pulses are decreased. There is edema of the dorsum of the right hand without suggestion of cellulitis Clubbing of the nailbeds present. She has decreased range of motion of the right upper extremity.  General appearance:Adequately nourished; no acute distress , increased work of breathing is present.   Lymphatic: No lymphadenopathy about the head, neck, axilla . Eyes: No conjunctival  inflammation or lid edema is present. There is no scleral icterus. Ears:  External ear exam shows no significant lesions or deformities.   Nose:  External nasal examination shows no deformity or inflammation. Nasal mucosa are pink and moist without lesions ,exudates Oral exam: lips and gums are healthy appearing.There is no oropharyngeal erythema or exudate . Neck:  No  masses, tenderness noted.    Heart:  No gallop, murmur, click, rub .  Abdomen:Bowel sounds are normal. Abdomen is soft and nontender with no organomegaly, hernias,masses. GU: deferred ;Foley present. Extremities:  No cyanosis  Neurologic exam : Strength equal  in upper & lower extremities but decreased Balance,Rhomberg,finger to nose testing could not be completed due to clinical state Skin: Warm & dry w/o tenting. No significant lesions or rash.  See clinical summary under each active problem in the Problem List with associated updated therapeutic plan

## 2016-10-31 NOTE — Patient Outreach (Signed)
[facility]) Care Management  10/31/2016  Mandy Rhodes [birth year] [id number]   Met with patient at bedside. Discussed [facility] care management program, she has been followed by [facility]. Patient reports she is hoping to go home but know she has a couple of weeks of antibiotics and therapy. Plan to follow patient when she returns home.  Met with Tillie Rung, LCSW at facility, discussed that patient is active with [facility] and that we would resume services upon discharge. She states that patient may not discharge until January due to a pending surgery.  She will keep RNCM updated on patient progress. Plan RNCM will follow for [facility] discharge planning needs. Royetta Crochet. Laymond Purser, RN, BSN, [facility] Care Coordinator [phone]

## 2016-10-31 NOTE — Assessment & Plan Note (Signed)
Monitor for hypoglycemia on the oral sulfonylurea

## 2016-11-01 ENCOUNTER — Inpatient Hospital Stay (HOSPITAL_COMMUNITY)
Admission: EM | Admit: 2016-11-01 | Discharge: 2016-11-05 | DRG: 291 | Disposition: A | Discharge status: Skilled Nursing Facility | Payer: Medicare Other | Attending: Internal Medicine | Admitting: Internal Medicine

## 2016-11-01 ENCOUNTER — Emergency Department (HOSPITAL_COMMUNITY): Payer: Medicare Other

## 2016-11-01 ENCOUNTER — Encounter (HOSPITAL_COMMUNITY): Payer: Self-pay

## 2016-11-01 DIAGNOSIS — E1122 Type 2 diabetes mellitus with diabetic chronic kidney disease: Secondary | ICD-10-CM | POA: Diagnosis present

## 2016-11-01 DIAGNOSIS — T8459XA Infection and inflammatory reaction due to other internal joint prosthesis, initial encounter: Secondary | ICD-10-CM | POA: Diagnosis present

## 2016-11-01 DIAGNOSIS — I459 Conduction disorder, unspecified: Secondary | ICD-10-CM | POA: Diagnosis present

## 2016-11-01 DIAGNOSIS — D638 Anemia in other chronic diseases classified elsewhere: Secondary | ICD-10-CM | POA: Diagnosis present

## 2016-11-01 DIAGNOSIS — M4622 Osteomyelitis of vertebra, cervical region: Secondary | ICD-10-CM | POA: Diagnosis present

## 2016-11-01 DIAGNOSIS — Z882 Allergy status to sulfonamides status: Secondary | ICD-10-CM

## 2016-11-01 DIAGNOSIS — E854 Organ-limited amyloidosis: Secondary | ICD-10-CM | POA: Diagnosis present

## 2016-11-01 DIAGNOSIS — I252 Old myocardial infarction: Secondary | ICD-10-CM

## 2016-11-01 DIAGNOSIS — L89152 Pressure ulcer of sacral region, stage 2: Secondary | ICD-10-CM | POA: Diagnosis present

## 2016-11-01 DIAGNOSIS — I472 Ventricular tachycardia: Secondary | ICD-10-CM | POA: Diagnosis present

## 2016-11-01 DIAGNOSIS — Z833 Family history of diabetes mellitus: Secondary | ICD-10-CM

## 2016-11-01 DIAGNOSIS — Z79899 Other long term (current) drug therapy: Secondary | ICD-10-CM

## 2016-11-01 DIAGNOSIS — Z981 Arthrodesis status: Secondary | ICD-10-CM

## 2016-11-01 DIAGNOSIS — I248 Other forms of acute ischemic heart disease: Secondary | ICD-10-CM | POA: Diagnosis present

## 2016-11-01 DIAGNOSIS — E785 Hyperlipidemia, unspecified: Secondary | ICD-10-CM | POA: Diagnosis present

## 2016-11-01 DIAGNOSIS — Y831 Surgical operation with implant of artificial internal device as the cause of abnormal reaction of the patient, or of later complication, without mention of misadventure at the time of the procedure: Secondary | ICD-10-CM | POA: Diagnosis present

## 2016-11-01 DIAGNOSIS — K219 Gastro-esophageal reflux disease without esophagitis: Secondary | ICD-10-CM | POA: Diagnosis present

## 2016-11-01 DIAGNOSIS — M4620 Osteomyelitis of vertebra, site unspecified: Secondary | ICD-10-CM | POA: Diagnosis present

## 2016-11-01 DIAGNOSIS — D649 Anemia, unspecified: Secondary | ICD-10-CM | POA: Diagnosis present

## 2016-11-01 DIAGNOSIS — I5043 Acute on chronic combined systolic (congestive) and diastolic (congestive) heart failure: Secondary | ICD-10-CM | POA: Diagnosis present

## 2016-11-01 DIAGNOSIS — I2510 Atherosclerotic heart disease of native coronary artery without angina pectoris: Secondary | ICD-10-CM | POA: Diagnosis present

## 2016-11-01 DIAGNOSIS — R0902 Hypoxemia: Secondary | ICD-10-CM

## 2016-11-01 DIAGNOSIS — J390 Retropharyngeal and parapharyngeal abscess: Secondary | ICD-10-CM | POA: Diagnosis present

## 2016-11-01 DIAGNOSIS — E873 Alkalosis: Secondary | ICD-10-CM | POA: Diagnosis not present

## 2016-11-01 DIAGNOSIS — L8990 Pressure ulcer of unspecified site, unspecified stage: Secondary | ICD-10-CM | POA: Diagnosis present

## 2016-11-01 DIAGNOSIS — R609 Edema, unspecified: Secondary | ICD-10-CM | POA: Diagnosis not present

## 2016-11-01 DIAGNOSIS — R1310 Dysphagia, unspecified: Secondary | ICD-10-CM | POA: Diagnosis present

## 2016-11-01 DIAGNOSIS — N183 Chronic kidney disease, stage 3 (moderate): Secondary | ICD-10-CM | POA: Diagnosis present

## 2016-11-01 DIAGNOSIS — Z931 Gastrostomy status: Secondary | ICD-10-CM

## 2016-11-01 DIAGNOSIS — N189 Chronic kidney disease, unspecified: Secondary | ICD-10-CM | POA: Diagnosis present

## 2016-11-01 DIAGNOSIS — R748 Abnormal levels of other serum enzymes: Secondary | ICD-10-CM | POA: Diagnosis not present

## 2016-11-01 DIAGNOSIS — R7989 Other specified abnormal findings of blood chemistry: Secondary | ICD-10-CM

## 2016-11-01 DIAGNOSIS — I428 Other cardiomyopathies: Secondary | ICD-10-CM

## 2016-11-01 DIAGNOSIS — I509 Heart failure, unspecified: Secondary | ICD-10-CM

## 2016-11-01 DIAGNOSIS — I43 Cardiomyopathy in diseases classified elsewhere: Secondary | ICD-10-CM | POA: Diagnosis present

## 2016-11-01 DIAGNOSIS — Z66 Do not resuscitate: Secondary | ICD-10-CM | POA: Diagnosis present

## 2016-11-01 DIAGNOSIS — R0600 Dyspnea, unspecified: Secondary | ICD-10-CM | POA: Insufficient documentation

## 2016-11-01 DIAGNOSIS — R778 Other specified abnormalities of plasma proteins: Secondary | ICD-10-CM | POA: Diagnosis present

## 2016-11-01 DIAGNOSIS — Z87891 Personal history of nicotine dependence: Secondary | ICD-10-CM

## 2016-11-01 DIAGNOSIS — J9601 Acute respiratory failure with hypoxia: Secondary | ICD-10-CM | POA: Diagnosis present

## 2016-11-01 DIAGNOSIS — E1142 Type 2 diabetes mellitus with diabetic polyneuropathy: Secondary | ICD-10-CM | POA: Diagnosis present

## 2016-11-01 DIAGNOSIS — E119 Type 2 diabetes mellitus without complications: Secondary | ICD-10-CM

## 2016-11-01 DIAGNOSIS — Z7982 Long term (current) use of aspirin: Secondary | ICD-10-CM

## 2016-11-01 DIAGNOSIS — I130 Hypertensive heart and chronic kidney disease with heart failure and stage 1 through stage 4 chronic kidney disease, or unspecified chronic kidney disease: Principal | ICD-10-CM | POA: Diagnosis present

## 2016-11-01 DIAGNOSIS — R0602 Shortness of breath: Secondary | ICD-10-CM | POA: Diagnosis present

## 2016-11-01 DIAGNOSIS — Z7984 Long term (current) use of oral hypoglycemic drugs: Secondary | ICD-10-CM

## 2016-11-01 HISTORY — DX: Other cardiomyopathies: I42.8

## 2016-11-01 HISTORY — DX: Cardiomyopathy, unspecified: I42.9

## 2016-11-01 HISTORY — DX: Atherosclerotic heart disease of native coronary artery without angina pectoris: I25.10

## 2016-11-01 LAB — CBC WITH DIFFERENTIAL/PLATELET
BASOS ABS: 0 10*3/uL (ref 0.0–0.1)
Basophils Absolute: 0 10*3/uL (ref 0.0–0.1)
Basophils Relative: 0 %
Basophils Relative: 0 %
EOS PCT: 1 %
Eosinophils Absolute: 0 10*3/uL (ref 0.0–0.7)
Eosinophils Absolute: 0.1 10*3/uL (ref 0.0–0.7)
Eosinophils Relative: 0 %
HCT: 27.9 % — ABNORMAL LOW (ref 36.0–46.0)
HCT: 27.9 % — ABNORMAL LOW (ref 36.0–46.0)
Hemoglobin: 9.1 g/dL — ABNORMAL LOW (ref 12.0–15.0)
Hemoglobin: 9.1 g/dL — ABNORMAL LOW (ref 12.0–15.0)
Lymphocytes Relative: 9 %
Lymphocytes Relative: 14 %
Lymphs Abs: 0.7 10*3/uL (ref 0.7–4.0)
Lymphs Abs: 0.9 10*3/uL (ref 0.7–4.0)
MCH: 28.6 pg (ref 26.0–34.0)
MCH: 28.4 pg (ref 26.0–34.0)
MCHC: 32.6 g/dL (ref 30.0–36.0)
MCHC: 32.6 g/dL (ref 30.0–36.0)
MCV: 87.7 fL (ref 78.0–100.0)
MCV: 87.2 fL (ref 78.0–100.0)
Monocytes Absolute: 0.5 10*3/uL (ref 0.1–1.0)
Monocytes Absolute: 0.6 10*3/uL (ref 0.1–1.0)
Monocytes Relative: 7 %
Monocytes Relative: 9 %
NEUTROS PCT: 76 %
Neutro Abs: 6.2 10*3/uL (ref 1.7–7.7)
Neutro Abs: 4.9 10*3/uL (ref 1.7–7.7)
Neutrophils Relative %: 84 %
PLATELETS: 181 10*3/uL (ref 150–400)
Platelets: 184 10*3/uL (ref 150–400)
RBC: 3.18 MIL/uL — ABNORMAL LOW (ref 3.87–5.11)
RBC: 3.2 MIL/uL — AB (ref 3.87–5.11)
RDW: 17.4 % — ABNORMAL HIGH (ref 11.5–15.5)
RDW: 17.3 % — ABNORMAL HIGH (ref 11.5–15.5)
WBC: 7.4 10*3/uL (ref 4.0–10.5)
WBC: 6.5 10*3/uL (ref 4.0–10.5)

## 2016-11-01 LAB — BASIC METABOLIC PANEL
Anion gap: 5 (ref 5–15)
BUN: 21 mg/dL — ABNORMAL HIGH (ref 6–20)
CO2: 27 mmol/L (ref 22–32)
Calcium: 9.1 mg/dL (ref 8.9–10.3)
Chloride: 104 mmol/L (ref 101–111)
Creatinine, Ser: 1.12 mg/dL — ABNORMAL HIGH (ref 0.44–1.00)
GFR calc Af Amer: 54 mL/min — ABNORMAL LOW (ref >60)
GFR calc non Af Amer: 47 mL/min — ABNORMAL LOW (ref >60)
Glucose, Bld: 279 mg/dL — ABNORMAL HIGH (ref 65–99)
Potassium: 4.3 mmol/L (ref 3.5–5.1)
Sodium: 136 mmol/L (ref 135–145)

## 2016-11-01 LAB — TROPONIN I
TROPONIN I: 0.44 ng/mL — AB (ref <0.03)
Troponin I: 0.41 ng/mL (ref <0.03)

## 2016-11-01 LAB — BRAIN NATRIURETIC PEPTIDE: B Natriuretic Peptide: 2941.5 pg/mL — ABNORMAL HIGH (ref 0.0–100.0)

## 2016-11-01 MED ORDER — FREE WATER
225.0000 mL | Status: DC
  Administered 2016-11-01 – 2016-11-02 (×4): 225 mL

## 2016-11-01 MED ORDER — ENSURE ENLIVE PO LIQD
237.0000 mL | Freq: Two times a day (BID) | ORAL | Status: DC
  Administered 2016-11-02: 237 mL via ORAL

## 2016-11-01 MED ORDER — FUROSEMIDE 10 MG/ML IJ SOLN
40.0000 mg | Freq: Two times a day (BID) | INTRAMUSCULAR | Status: DC
  Administered 2016-11-02 – 2016-11-05 (×7): 40 mg via INTRAVENOUS
  Filled 2016-11-01 (×7): qty 4

## 2016-11-01 MED ORDER — IRBESARTAN 75 MG PO TABS
37.5000 mg | ORAL_TABLET | Freq: Every day | ORAL | Status: DC
  Administered 2016-11-01 – 2016-11-05 (×5): 37.5 mg via ORAL
  Filled 2016-11-01 (×6): qty 0.5

## 2016-11-01 MED ORDER — FREE WATER: 150.0000 mL | Status: DC

## 2016-11-01 MED ORDER — PRAVASTATIN SODIUM 40 MG PO TABS
40.0000 mg | ORAL_TABLET | Freq: Every evening | ORAL | Status: DC
  Administered 2016-11-02 – 2016-11-04 (×3): 40 mg via ORAL
  Filled 2016-11-01 (×3): qty 1

## 2016-11-01 MED ORDER — FUROSEMIDE 10 MG/ML IJ SOLN
40.0000 mg | Freq: Once | INTRAMUSCULAR | Status: AC
  Administered 2016-11-01: 40 mg via INTRAVENOUS
  Filled 2016-11-01: qty 4

## 2016-11-01 MED ORDER — NUTREN 2.0 PO LIQD: 40.0000 mL/h | ORAL | Status: DC

## 2016-11-01 MED ORDER — ALBUTEROL SULFATE (2.5 MG/3ML) 0.083% IN NEBU
2.5000 mg | INHALATION_SOLUTION | Freq: Once | RESPIRATORY_TRACT | Status: AC
  Administered 2016-11-01: 2.5 mg via RESPIRATORY_TRACT
  Filled 2016-11-01: qty 3

## 2016-11-01 MED ORDER — ONDANSETRON HCL 4 MG/2ML IJ SOLN: 4.0000 mg | Freq: Four times a day (QID) | INTRAMUSCULAR | Status: DC | PRN

## 2016-11-01 MED ORDER — OSMOLITE 1.5 CAL PO LIQD
1000.0000 mL | ORAL | Status: DC
  Administered 2016-11-01: 1000 mL
  Filled 2016-11-01 (×2): qty 1000

## 2016-11-01 MED ORDER — PRO-STAT SUGAR FREE PO LIQD
30.0000 mL | Freq: Every day | ORAL | Status: DC
  Administered 2016-11-02: 30 mL via ORAL
  Filled 2016-11-01: qty 30

## 2016-11-01 MED ORDER — PANTOPRAZOLE SODIUM 40 MG PO PACK
80.0000 mg | PACK | Freq: Every day | ORAL | Status: DC
  Administered 2016-11-03 – 2016-11-05 (×3): 80 mg
  Filled 2016-11-01 (×4): qty 40

## 2016-11-01 MED ORDER — SODIUM CHLORIDE 0.9% FLUSH
10.0000 mL | INTRAVENOUS | Status: DC | PRN
  Administered 2016-11-01 – 2016-11-05 (×3): 10 mL
  Filled 2016-11-01 (×2): qty 40

## 2016-11-01 MED ORDER — LISINOPRIL 5 MG PO TABS: 5.0000 mg | ORAL_TABLET | Freq: Every day | ORAL | Status: DC

## 2016-11-01 MED ORDER — ASPIRIN EC 81 MG PO TBEC: 81.0000 mg | DELAYED_RELEASE_TABLET | Freq: Every day | ORAL | Status: DC

## 2016-11-01 MED ORDER — ACETAMINOPHEN 325 MG PO TABS
650.0000 mg | ORAL_TABLET | ORAL | Status: DC | PRN
  Administered 2016-11-02 (×2): 650 mg via ORAL
  Filled 2016-11-01 (×2): qty 2

## 2016-11-01 MED ORDER — PANTOPRAZOLE SODIUM 40 MG PO TBEC: 80.0000 mg | DELAYED_RELEASE_TABLET | Freq: Every day | ORAL | Status: DC

## 2016-11-01 MED ORDER — SODIUM CHLORIDE 0.9% FLUSH: 3.0000 mL | INTRAVENOUS | Status: DC | PRN

## 2016-11-01 MED ORDER — ENOXAPARIN SODIUM 40 MG/0.4ML ~~LOC~~ SOLN
40.0000 mg | SUBCUTANEOUS | Status: DC
  Administered 2016-11-01 – 2016-11-04 (×4): 40 mg via SUBCUTANEOUS
  Filled 2016-11-01 (×4): qty 0.4

## 2016-11-01 MED ORDER — SODIUM CHLORIDE 0.9 % IV SOLN
250.0000 mL | INTRAVENOUS | Status: DC | PRN
  Administered 2016-11-04: 250 mL via INTRAVENOUS

## 2016-11-01 MED ORDER — SODIUM CHLORIDE 0.9 % IV SOLN
3.0000 g | Freq: Three times a day (TID) | INTRAVENOUS | Status: DC
  Administered 2016-11-01 – 2016-11-05 (×12): 3 g via INTRAVENOUS
  Filled 2016-11-01 (×16): qty 3

## 2016-11-01 MED ORDER — SODIUM CHLORIDE 0.9% FLUSH
3.0000 mL | Freq: Two times a day (BID) | INTRAVENOUS | Status: DC
  Administered 2016-11-04: 3 mL via INTRAVENOUS

## 2016-11-01 MED ORDER — POTASSIUM CHLORIDE 20 MEQ/15ML (10%) PO SOLN
40.0000 meq | Freq: Once | ORAL | Status: AC
  Administered 2016-11-02: 40 meq
  Filled 2016-11-01: qty 30

## 2016-11-01 MED ORDER — NORTRIPTYLINE HCL 10 MG PO CAPS
10.0000 mg | ORAL_CAPSULE | Freq: Every day | ORAL | Status: DC
  Administered 2016-11-02 – 2016-11-04 (×3): 10 mg via ORAL
  Filled 2016-11-01 (×5): qty 1

## 2016-11-01 MED ORDER — ASPIRIN 81 MG PO CHEW
81.0000 mg | CHEWABLE_TABLET | Freq: Every day | ORAL | Status: DC
  Administered 2016-11-02 – 2016-11-05 (×4): 81 mg
  Filled 2016-11-01 (×5): qty 1

## 2016-11-01 MED ORDER — AMPICILLIN-SULBACTAM IV (FOR PTA / DISCHARGE USE ONLY): 3.0000 g | Freq: Three times a day (TID) | INTRAVENOUS | Status: DC

## 2016-11-01 NOTE — ED Notes (Signed)
Dr. Wilson Singer notified that troponin resulted at 0.41.

## 2016-11-01 NOTE — ED Triage Notes (Signed)
Pt presents from [facility] with report of shortness of breath.  Pt is being treated for pneumonia, staff reports O2 sat in 75s with pt on 3L [place].

## 2016-11-01 NOTE — ED Notes (Signed)
Patient still receiving her breathing treatment; visitor at bedside

## 2016-11-01 NOTE — H&P (Signed)
History and Physical    Mandy Rhodes YBW:[id number] DOB: [birth year] DOA: 11/01/2016  PCP: Vidal Schwalbe, MD Consultants:  [facility] - cardiology; [facility] - ophthalmology; [facility] - nephrology; [facility] - neurosurgery Patient coming from: [facility]; NOK: daughter-in-law, [phone]  Chief Complaint: SOB, hypoxia  HPI: Mandy Rhodes is a 75 y.o. female with medical history significant of DM, NICM/chronic combined CHF due to suspected cardiac amyloidosis, CKD stage 3, and HTN with recent prolonged hospitalization for infected neck hardware with subsequent NSTEMI/SVT discharged to [facility] for rehab.  She presents today for SOB and hypoxia to as low as 82% since Monday.   +chest pressure across her chest, worse yesterday.  Does not usually wear home O2.    Has h/o CHF with exacerbations.  Cough - productive of foamy clear sputum.  Has not walked since Thanksgiving.  No fevers.  No known sick contacts - but in SNF at this time.  Also has open sore on bottom.  She was admitted from 11/23-12/8 as a result of sepsis from infected cervical hardware with a retropharyngeal abscess and removal of hardware on 11/29; she remains on Unasyn q8h through 12/19 via PICC line.  She had acute hypercarbic respiratory failure requiring intubation with extubation on 12/1.  She had dysphagia requiring PEG tube placement.  She had a subsequent NSTEMI vs. Demand ischemia with SVT during the hospitalization - and her Lasix 40 mg daily and Captopril 12.5 mg BID were held at the time of discharge; Dr. Angelena Form recommended gradual resumption of HF meds including valsartan over captopril to allow return to [facility] in the future.  She also developed a stage 2 sacral pressure ulcer.    ED Course: Per Dr. Wilson Singer: 75 year old female with increasing dyspnea and productive cough with sputum. Clinically suspect heart failure. I doubt this is directly related to her neck surgery. Chest x-ray is without focal infiltrate. She  has been on Unasyn via PICC since discharge. Pitting lower extremity edema. BMP sniffily elevated. Her troponin is elevated, but she denies any acute chest pain. Not sure this is related to heart failure or still trending down from recent elevation during her hospitalization. Catheterization 7 months ago with nonobstructive CAD.   Review of Systems: As per HPI; otherwise 10 point review of systems reviewed and negative.   Ambulatory Status: non-ambulatory since Thanksgiving  Past Medical History:  Diagnosis Date  . Anemia   . Arthritis   . CHF (congestive heart failure) ([place])   . Chronic kidney disease    Renal Insuffiency, see [facility] once a year  . Diabetes mellitus    type 2  . GERD (gastroesophageal reflux disease)   . History of rotator cuff tear    Torn right rotator cuff.  . History of spinal stenosis   . Hypertension   . Neuropathy associated with endocrine disorder [facility])     Past Surgical History:  Procedure Laterality Date  . ABDOMINAL HYSTERECTOMY  1971   Partial  . ANTERIOR CERVICAL DECOMP/DISCECTOMY FUSION N/A 10/18/2016   Procedure: debridement and drainage cervical prevertebral abscess  ANTERIOR CERVICAL HARDWARE REMOVAL exploration cervical fusion;  Surgeon: Earnie Larsson, MD;  Location: [place];  Service: Neurosurgery;  Laterality: N/A;  . APPENDECTOMY    . BACK SURGERY  2009   Disectomy  . CARDIAC CATHETERIZATION N/A 03/22/2016   Procedure: Left Heart Cath and Coronary Angiography;  Surgeon: Peter M Martinique, MD;  Location: [place] CV [facility];  Service: Cardiovascular;  Laterality: N/A;  . CHOLECYSTECTOMY  1969   Status post   . CYST REMOVAL TRUNK Right 12/03/2014   Procedure: EXCISION OF RIGHT BACK CYST ;  Surgeon: Coralie Keens, MD;  Location: [place];  Service: General;  Laterality: Right;  . DIRECT LARYNGOSCOPY N/A 10/17/2016   Procedure: DIRECT LARYNGOSCOPY,  ESOPHAGOSCOPY;  Surgeon: [name], MD;  Location: [facility];  Service: ENT;  Laterality: N/A;    . FOOT SURGERY Bilateral    bone removed  . IR GENERIC HISTORICAL  10/25/2016   IR GASTROSTOMY TUBE MOD SED 10/25/2016 [name], MD [facility]-INTER[facility]  . NEUROPLASTY / TRANSPOSITION MEDIAN NERVE AT CARPAL TUNNEL Bilateral    Hx  Left carpal tunnel repair  . TRACHEOSTOMY TUBE PLACEMENT N/A 10/17/2016   Procedure: TRACHEOSTOMY;  Surgeon: [name], MD;  Location: [facility];  Service: ENT;  Laterality: N/A;    Social History   Social History  . Marital status: Divorced    Spouse name: N/A  . Number of children: N/A  . Years of education: N/A   Occupational History  . retired    Social History Main Topics  . Smoking status: Former Research scientist (life sciences)  . Smokeless tobacco: Never Used     Comment: 12/02/13- quit over 20 years ago  . Alcohol use No  . Drug use: No  . Sexual activity: Not Currently   Other Topics Concern  . Not on file   Social History Narrative  . No narrative on file    Allergies  Allergen Reactions  . Sulfa Antibiotics Swelling    Family History  Problem Relation Age of Onset  . Diabetes type II Mother   . Hypertension Father   . Diabetes type II Sister   . Ovarian cancer Sister   . Diabetes type II Other   . Stroke Neg Hx   . CAD Neg Hx   . Heart failure Neg Hx     Prior to Admission medications   Medication Sig Start Date End Date Taking? Authorizing Provider  Amino Acids-Protein Hydrolys (FEEDING SUPPLEMENT, PRO-STAT SUGAR FREE 64,) LIQD Take 30 mLs by mouth daily.   Yes Historical Provider, MD  ampicillin-sulbactam (UNASYN) IVPB Inject 3 g into the vein every 8 (eight) hours. Indication:  Retropharyngeal abscess Last Day of Therapy:  11/07/16 Labs - Once weekly:  CBC/D and BMP, Labs - Every other week:  ESR and CRP 10/27/16 11/08/16 Yes Nita Sells, MD  aspirin EC 81 MG EC tablet Take 1 tablet (81 mg total) by mouth daily. 03/23/16  Yes Shanker Kristeen Mans, MD  Calcium Carb-Cholecalciferol (CALCIUM 600 + D PO) Take 1 tablet by mouth every morning.    Yes Historical Provider, MD  cholecalciferol (VITAMIN D) 1000 UNITS tablet Take 1,000 Units by mouth daily. Reported on 03/27/2016   Yes Historical Provider, MD  ferrous sulfate 325 (65 FE) MG EC tablet Take 325 mg by mouth daily.    Yes Historical Provider, MD  glimepiride (AMARYL) 1 MG tablet Take 0.5 tablets (0.5 mg total) by mouth daily with breakfast. 10/27/16  Yes Nita Sells, MD  Multiple Vitamin (MULTIVITAMIN) tablet Take 1 tablet by mouth daily.     Yes Historical Provider, MD  nortriptyline (PAMELOR) 10 MG capsule Take 10 mg by mouth at bedtime.   Yes Historical Provider, MD  Nutritional Supplements (NUTREN 2.0) LIQD Take 40 mL/hr by mouth continuous.   Yes Historical Provider, MD  omeprazole (PRILOSEC) 40 MG capsule Take 40 mg by mouth daily.     Yes Historical Provider, MD  OXYGEN Inhale 2 L into the lungs daily as needed (for < 90% sats).   Yes Historical Provider, MD  pravastatin (PRAVACHOL) 40 MG tablet Take 40 mg by mouth every evening.    Yes Historical Provider, MD  Water For Irrigation, Sterile (FREE WATER) SOLN Place 150-175 mLs into feeding tube See admin instructions. Takes 156m flush via nutren pump every 4 hours Takes 1522mflush via feeding tube every 8 hours = Total of 220486mree water daily.   Yes Historical Provider, MD  potassium chloride SA (K-DUR,KLOR-CON) 20 MEQ tablet Take 1 tablet (20 mEq total) by mouth daily. May take an extra tablet if extra furosemide is taken Patient not taking: Reported on 11/01/2016 09/04/16   KenPixie CasinoD    Physical Exam: Vitals:   11/01/16 1800 11/01/16 1830 11/01/16 1900 11/01/16 1930  BP: 133/93 136/80 123/85 137/80  Pulse: 85 90 89 86  Resp:      Temp:      TempSrc:      SpO2: 97% 97% 97% 99%     General: Chronically-ill appearing, intermittent cough with spittoon at the bedside filled with frothy clear sputum Eyes:  PERRL, EOMI, normal lids, iris ENT:  grossly normal hearing, lips & tongue, mmm Neck:  no  LAD, masses or thyromegaly; healing surgical wound on left neck Cardiovascular: RRR, no m/r/g. 1+ pitting LE edema.  Respiratory: Coarse ronchorous breath sounds anteriorly. Normal respiratory effort. Abdomen:  soft, ntnd, NABS; PEG tube in place, base appears C/D/I Skin:  no rash or induration seen on limited exam Musculoskeletal:  grossly normal tone BUE/BLE, good ROM, no bony abnormality Psychiatric:  grossly normal mood and affect, speech fluent and appropriate, AOx3 Neurologic:  CN 2-12 grossly intact, moves all extremities in coordinated fashion, sensation intact  Labs on Admission: I have personally reviewed following labs and imaging studies  CBC:  Recent Labs Lab 11/01/16 1540  WBC 7.4  NEUTROABS 6.2  HGB 9.1*  HCT 27.9*  MCV 87.7  PLT [id number] Metabolic Panel:  Recent Labs Lab 10/26/16 0211 10/27/16 0421 11/01/16 1540  NA 143 141  142 136  K 3.0* 3.2*  3.3* 4.3  CL 112* 108  109 104  CO2 '25 24  26 27  ' GLUCOSE 82 150*  150* 279*  BUN '8 8  7 ' 21*  CREATININE 1.28* 1.23*  1.23* 1.12*  CALCIUM 8.7* 8.6*  8.5* 9.1  MG  --  1.8  --   PHOS 2.9 2.1*  2.2*  --    GFR: Estimated Creatinine Clearance: 49.7 mL/min (by C-G formula based on SCr of 1.12 mg/dL (H)). Liver Function Tests:  Recent Labs Lab 10/26/16 0211 10/27/16 0421  AST  --  15  ALT  --  19  ALKPHOS  --  71  BILITOT  --  0.6  PROT  --  5.6*  ALBUMIN 1.8* 1.8*  1.7*   No results for input(s): LIPASE, AMYLASE in the last 168 hours. No results for input(s): AMMONIA in the last 168 hours. Coagulation Profile: No results for input(s): INR, PROTIME in the last 168 hours. Cardiac Enzymes:  Recent Labs Lab 11/01/16 1540  TROPONINI 0.41*   BNP (last 3 results) No results for input(s): PROBNP in the last 8760 hours. HbA1C: No results for input(s): HGBA1C in the last 72 hours. CBG:  Recent Labs Lab 10/26/16 2055 10/27/16 0016 10/27/16 0538 10/27/16 0902 10/27/16 1153  GLUCAP  135* 135* 166* 179* 193*   Lipid Profile:  No results for input(s): CHOL, HDL, LDLCALC, TRIG, CHOLHDL, LDLDIRECT in the last 72 hours. Thyroid Function Tests: No results for input(s): TSH, T4TOTAL, FREET4, T3FREE, THYROIDAB in the last 72 hours. Anemia Panel: No results for input(s): VITAMINB12, FOLATE, FERRITIN, TIBC, IRON, RETICCTPCT in the last 72 hours. Urine analysis:    Component Value Date/Time   COLORURINE AMBER (A) 10/13/2016 0503   APPEARANCEUR CLOUDY (A) 10/13/2016 0503   LABSPEC 1.023 10/13/2016 0503   LABSPEC 1.020 12/22/2010 1142   PHURINE 5.0 10/13/2016 0503   GLUCOSEU NEGATIVE 10/13/2016 0503   HGBUR NEGATIVE 10/13/2016 0503   BILIRUBINUR MODERATE (A) 10/13/2016 0503   BILIRUBINUR Negative 12/22/2010 1142   KETONESUR NEGATIVE 10/13/2016 0503   PROTEINUR NEGATIVE 10/13/2016 0503   UROBILINOGEN 0.2 07/15/2015 1349   NITRITE NEGATIVE 10/13/2016 0503   LEUKOCYTESUR NEGATIVE 10/13/2016 0503   LEUKOCYTESUR Negative 12/22/2010 1142    Creatinine Clearance: Estimated Creatinine Clearance: 49.7 mL/min (by C-G formula based on SCr of 1.12 mg/dL (H)).  Sepsis Labs: '@LABRCNTIP' (procalcitonin:4,lacticidven:4) )No results found for this or any previous visit (from the past 240 hour(s)).   Radiological Exams on Admission: Dg Chest Portable 1 View  Result Date: 11/01/2016 CLINICAL DATA:  Shortness of Breath EXAM: PORTABLE CHEST 1 VIEW COMPARISON:  10/26/2016 FINDINGS: Right-sided PICC line is again identified and stable. Cardiomegaly is again seen. The lungs are well aerated with mild bibasilar atelectatic changes and small left pleural effusion. The overall appearance is stable from the prior exam. The degree of vascular congestion has improved slightly in the interval from the prior study. IMPRESSION: Bibasilar changes stable from the recent exam. Stable right PICC line. Electronically Signed   By: Inez Catalina M.D.   On: 11/01/2016 15:37    EKG: Independently reviewed.  NSR  with rate 88; PVCs; nonspecific ST changes with no evidence of acute ischemia  Assessment/Plan Principal Problem:   Acute respiratory failure with hypoxia (HCC) Active Problems:   Diabetes mellitus (HCC)   Elevated troponin   CKD (chronic kidney disease)   Acute on chronic combined systolic and diastolic congestive heart failure (HCC)   Pressure injury of skin   Anemia   Vertebral osteomyelitis (HCC)   Dysphagia   Acute respiratory failure with hypoxia 2* to CHF exacerbation -Patient with recent prolonged hospitalization -Discharged to SNF, but heart failure medications have not been resumed -Echo from 11/24 showed EF 30-35% with grade 2 diastolic dysfunction -CXR does not clearly indicate pulmonary edema but her history and physical are consistent -Normal WBC count, already on Unasyn (see below) -Elevated BNP of 2941.5, markedly elevated from 313.4 on 11/23 -EKG unremarkable -Prior h/o NSTEMI vs. Demand ischemia, troponin was not downtrended; current troponin is 0.41, improved from 1.09 on 11/24; will trend based on c/o some chest pressure (not at all her greatest concern). -Will admit with telemetry -Should not need a repeat Echo -Will continue ASA -Will start Avapro for now based on formulary, with plan to discharge on Valsartan for eventual transition to Entresto -No beta blocker due to acute decompensation, but this should also be considered in the future. -CHF order set utilized; will start with cardiology consult (requested through Inbox message to [facility]) but may also need CHF team consult -Was given Lasix 40 mg x 1 in ER and will repeat with 40 mg IV BID for now -Continue [place] O2 for now -Repeat EKG in AM  Osteo -Has PICC line in place -Will continue Unasyn q8h through 12/19 as per ID recommendations  DM -Suboptimally controlled  based on current glucose of 279 -A1c was 7.1 on 10/13/16 so this may be appropriate control for her based on her age and  comorbidities -Will hold Amaryl and cover with SSI  CKD -Creatinine 1.12, improved from 1.23  Anemia  Pressure ulcer -Wound care by nursing staff -Consult by wound care team placed  Dysphagia -PEG tube in place -Using nutrition recommendations from prior hospitalization  DVT prophylaxis:  Lovenox Code Status: DNR - confirmed with patient/family Family Communication: Daughter-in-law/POA and granddaughter were present throughout evaluation  Disposition Plan: Back to SNF once clinically improved Consults called: Cardiology (via Inbox message); SW; wound care Admission status:  Admit - It is my clinical opinion that admission to INPATIENT is reasonable and necessary because this patient will require at least 2 midnights in the hospital to treat this condition based on the medical complexity of the problems presented.  Given the aforementioned information, the predictability of an adverse outcome is felt to be significant.    Karmen Bongo MD [facility]  If 7PM-7AM, please contact night-coverage [email or web address] Password [email or web address]  11/01/2016, 8:03 PM

## 2016-11-01 NOTE — ED Notes (Signed)
Dr. Wilson Singer notified that pt family would like to be updated.

## 2016-11-01 NOTE — ED Provider Notes (Signed)
[facility] DEPT Provider Note   CSN: [id number] Arrival date & time: 11/01/16  1459     History   Chief Complaint Chief Complaint  Patient presents with  . Shortness of Breath    HPI Mandy Rhodes is a 75 y.o. female.  HPI   75 year old female with dyspnea. She is transferred from [facility] rehabilitation for evaluation of hypoxemia. Recent admission to the hospital with infected cervical hardware with retropharyngeal abscess status post I&D. She is still on Unasyn. During her hospitalization she had acute hypercarbic respiratory failure requiring intubation. She also had a non-STEMI and SVT. Cardiac catheterization this past May with pretty minimal nonobstructive CAD. History of CHF. Smoking history with a diagnosis of COPD. She is not chronically on oxygen.  Treatment reports increasing dyspnea over the past day. Cough productive for whitish sputum. Denies new pain. No fever.   Past Medical History:  Diagnosis Date  . Anemia   . Arthritis   . CHF (congestive heart failure) ([place])   . Chronic kidney disease    Renal Insuffiency, see [facility] once a year  . Diabetes mellitus    type 2  . GERD (gastroesophageal reflux disease)   . History of rotator cuff tear    Torn right rotator cuff.  . History of spinal stenosis   . Hypertension   . Neuropathy associated with endocrine disorder [facility])     Patient Active Problem List   Diagnosis Date Noted  . Esophageal perforation   . Vertebral osteomyelitis ([place])   . Acute encephalopathy   . Sepsis ([place]) 10/16/2016  . Laryngeal mass 10/16/2016  . Hyperosmolality and/or hypernatremia 10/16/2016  . Anemia 10/16/2016  . Hypokalemia 10/16/2016  . Demand ischemia ([place]) 10/16/2016  . CAP (community acquired pneumonia) 10/16/2016  . PAT (paroxysmal atrial tachycardia) ([place]) 10/14/2016  . Malnutrition of moderate degree 10/14/2016  . Acute hypercapnic respiratory failure ([place]) 10/13/2016  . Pressure injury of skin  10/13/2016  . Mass   . Acute kidney injury superimposed on CKD ([place])   . Acute on chronic combined systolic and diastolic congestive heart failure ([place])   . Edema 08/23/2016  . Shortness of breath 07/14/2016  . Medication management 07/14/2016  . Non-ischemic cardiomyopathy ([place]) 07/14/2016  . Hyperlipidemia 07/14/2016  . Chronic combined systolic and diastolic heart failure ([place]) 04/13/2016  . CHF (congestive heart failure) ([place]) 03/20/2016  . CKD (chronic kidney disease)   . Acute systolic congestive heart failure, NYHA class 3 ([place]) 03/19/2016  . Cough   . Cardiomegaly 03/17/2016  . CKD (chronic kidney disease) stage 2, GFR 60-89 ml/min 03/17/2016  . Elevated troponin 03/17/2016  . Essential hypertension 03/17/2016  . DM type 2 causing CKD stage 2 ([place]) 03/17/2016  . Elevated troponin I level 03/17/2016  . Deficiency anemia 09/29/2011  . Diabetes mellitus ([place]) 09/29/2011  . Renal insufficiency 09/29/2011    Past Surgical History:  Procedure Laterality Date  . ABDOMINAL HYSTERECTOMY  1971   Partial  . ANTERIOR CERVICAL DECOMP/DISCECTOMY FUSION N/A 10/18/2016   Procedure: debridement and drainage cervical prevertebral abscess  ANTERIOR CERVICAL HARDWARE REMOVAL exploration cervical fusion;  Surgeon: Earnie Larsson, MD;  Location: [place];  Service: Neurosurgery;  Laterality: N/A;  . APPENDECTOMY    . BACK SURGERY  2009   Disectomy  . CARDIAC CATHETERIZATION N/A 03/22/2016   Procedure: Left Heart Cath and Coronary Angiography;  Surgeon: Peter M Martinique, MD;  Location: [facility];  Service: Cardiovascular;  Laterality: N/A;  . CHOLECYSTECTOMY  1969  Status post   . CYST REMOVAL TRUNK Right 12/03/2014   Procedure: EXCISION OF RIGHT BACK CYST ;  Surgeon: Coralie Keens, MD;  Location: [facility];  Service: General;  Laterality: Right;  . DIRECT LARYNGOSCOPY N/A 10/17/2016   Procedure: DIRECT LARYNGOSCOPY,  ESOPHAGOSCOPY;  Surgeon: [name], MD;  Location: [facility];  Service:  ENT;  Laterality: N/A;  . FOOT SURGERY Bilateral    bone removed  . IR GENERIC HISTORICAL  10/25/2016   IR GASTROSTOMY TUBE MOD SED 10/25/2016 [name], MD [facility]-INTER[facility] RAD  . NEUROPLASTY / TRANSPOSITION MEDIAN NERVE AT CARPAL TUNNEL Bilateral    Hx  Left carpal tunnel repair  . TRACHEOSTOMY TUBE PLACEMENT N/A 10/17/2016   Procedure: TRACHEOSTOMY;  Surgeon: [name], MD;  Location: [facility];  Service: ENT;  Laterality: N/A;    OB History    No data available       Home Medications    Prior to Admission medications   Medication Sig Start Date End Date Taking? Authorizing Provider  Amino Acids-Protein Hydrolys (FEEDING SUPPLEMENT, PRO-STAT SUGAR FREE 64,) LIQD Take 30 mLs by mouth daily.   Yes Historical Provider, MD  ampicillin-sulbactam (UNASYN) IVPB Inject 3 g into the vein every 8 (eight) hours. Indication:  Retropharyngeal abscess Last Day of Therapy:  11/07/16 Labs - Once weekly:  CBC/D and BMP, Labs - Every other week:  ESR and CRP 10/27/16 11/08/16 Yes Nita Sells, MD  aspirin EC 81 MG EC tablet Take 1 tablet (81 mg total) by mouth daily. 03/23/16  Yes Shanker Kristeen Mans, MD  Calcium Carb-Cholecalciferol (CALCIUM 600 + D PO) Take 1 tablet by mouth every morning.   Yes Historical Provider, MD  cholecalciferol (VITAMIN D) 1000 UNITS tablet Take 1,000 Units by mouth daily. Reported on 03/27/2016   Yes Historical Provider, MD  ferrous sulfate 325 (65 FE) MG EC tablet Take 325 mg by mouth daily.    Yes Historical Provider, MD  glimepiride (AMARYL) 1 MG tablet Take 0.5 tablets (0.5 mg total) by mouth daily with breakfast. 10/27/16  Yes Nita Sells, MD  Multiple Vitamin (MULTIVITAMIN) tablet Take 1 tablet by mouth daily.     Yes Historical Provider, MD  nortriptyline (PAMELOR) 10 MG capsule Take 10 mg by mouth at bedtime.   Yes Historical Provider, MD  Nutritional Supplements (NUTREN 2.0) LIQD Take 40 mL/hr by mouth continuous.   Yes Historical Provider, MD  omeprazole  (PRILOSEC) 40 MG capsule Take 40 mg by mouth daily.     Yes Historical Provider, MD  OXYGEN Inhale 2 L into the lungs daily as needed (for < 90% sats).   Yes Historical Provider, MD  pravastatin (PRAVACHOL) 40 MG tablet Take 40 mg by mouth every evening.    Yes Historical Provider, MD  Water For Irrigation, Sterile (FREE WATER) SOLN Place 150-175 mLs into feeding tube See admin instructions. Takes 175m flush via nutren pump every 4 hours Takes 1557mflush via feeding tube every 8 hours = Total of 220452mree water daily.   Yes Historical Provider, MD  potassium chloride SA (K-DUR,KLOR-CON) 20 MEQ tablet Take 1 tablet (20 mEq total) by mouth daily. May take an extra tablet if extra furosemide is taken Patient not taking: Reported on 11/01/2016 09/04/16   KenPixie CasinoD    Family History Family History  Problem Relation Age of Onset  . Diabetes type II Mother   . Hypertension Father   . Diabetes type II Sister   . Ovarian cancer Sister   .  Diabetes type II Other   . Stroke Neg Hx   . CAD Neg Hx   . Heart failure Neg Hx     Social History Social History  Substance Use Topics  . Smoking status: Former Research scientist (life sciences)  . Smokeless tobacco: Never Used     Comment: 12/02/13- quit over 20 years ago  . Alcohol use No     Allergies   Sulfa antibiotics   Review of Systems Review of Systems   All systems reviewed and negative, other than as noted in HPI.    Physical Exam Updated Vital Signs BP (!) 144/101   Pulse 94   Temp 97.8 F (36.6 C) (Oral)   Resp 22   SpO2 96%   Physical Exam  Constitutional: She appears well-developed and well-nourished. No distress.  HENT:  Head: Normocephalic and atraumatic.  Left anterior neck surgical incision appears to be healing without complication.  Eyes: Conjunctivae are normal. Right eye exhibits no discharge. Left eye exhibits no discharge.  Neck: Neck supple.  Cardiovascular: Normal rate, regular rhythm and normal heart sounds.  Exam  reveals no gallop and no friction rub.   No murmur heard. PICC line  Pulmonary/Chest: Effort normal.  Tachypnea. Decreased breath sounds bilaterally.  Abdominal: Soft. She exhibits no distension. There is no tenderness.  Gastric tube  Genitourinary:  Genitourinary Comments: Foley catheter  Musculoskeletal: She exhibits edema. She exhibits no tenderness.  Symmetric, pitting lower extremity edema.  Neurological: She is alert.  Skin: Skin is warm and dry.  Psychiatric: She has a normal mood and affect. Her behavior is normal. Thought content normal.  Nursing note and vitals reviewed.    ED Treatments / Results  Labs (all labs ordered are listed, but only abnormal results are displayed) Labs Reviewed  BRAIN NATRIURETIC PEPTIDE - Abnormal; Notable for the following:       Result Value   B Natriuretic Peptide 2,941.5 (*)    All other components within normal limits  TROPONIN I - Abnormal; Notable for the following:    Troponin I 0.41 (*)    All other components within normal limits  CBC WITH DIFFERENTIAL/PLATELET - Abnormal; Notable for the following:    RBC 3.18 (*)    Hemoglobin 9.1 (*)    HCT 27.9 (*)    RDW 17.4 (*)    All other components within normal limits  BASIC METABOLIC PANEL - Abnormal; Notable for the following:    Glucose, Bld 279 (*)    BUN 21 (*)    Creatinine, Ser 1.12 (*)    GFR calc non Af Amer 47 (*)    GFR calc Af Amer 54 (*)    All other components within normal limits    EKG  EKG Interpretation  Date/Time:  Wednesday November 01 2016 16:29:49 EST Ventricular Rate:  88 PR Interval:  184 QRS Duration: 102 QT Interval:  376 QTC Calculation: 454 R Axis:   76 Text Interpretation:  Sinus rhythm with occasional Premature ventricular complexes Nonspecific T wave abnormality Abnormal ECG Confirmed by Wilson Singer  MD, Shania Bjelland [phone]) on 11/01/2016 4:48:14 PM       Radiology Dg Chest Portable 1 View  Result Date: 11/01/2016 CLINICAL DATA:  Shortness of  Breath EXAM: PORTABLE CHEST 1 VIEW COMPARISON:  10/26/2016 FINDINGS: Right-sided PICC line is again identified and stable. Cardiomegaly is again seen. The lungs are well aerated with mild bibasilar atelectatic changes and small left pleural effusion. The overall appearance is stable from the prior exam. The degree  of vascular congestion has improved slightly in the interval from the prior study. IMPRESSION: Bibasilar changes stable from the recent exam. Stable right PICC line. Electronically Signed   By: Inez Catalina M.D.   On: 11/01/2016 15:37    Procedures Procedures (including critical care time)  Medications Ordered in ED Medications  albuterol (PROVENTIL) (2.5 MG/3ML) 0.083% nebulizer solution 2.5 mg (not administered)     Initial Impression / Assessment and Plan / ED Course  I have reviewed the triage vital signs and the nursing notes.  Pertinent labs & imaging results that were available during my care of the patient were reviewed by me and considered in my medical decision making (see chart for details).  Clinical Course    75 year old female with increasing dyspnea and productive cough with sputum. Clinically suspect heart failure. I doubt this is directly related to her neck surgery. Chest x-ray is without focal infiltrate. She has been on Unasyn via PICC since discharge. Pitting lower extremity edema. BMP sniffily elevated. Her troponin is elevated, but she denies any acute chest pain. Not sure this is related to heart failure or still trending down from recent elevation during her hospitalization. Catheterization 7 months ago with nonobstructive CAD.  Final Clinical Impressions(s) / ED Diagnoses   Final diagnoses:  Dyspnea, unspecified type  Heart failure, unspecified heart failure chronicity, unspecified heart failure type ([place])   I personally preformed the services scribed in my presence. The recorded information has been reviewed is accurate. Virgel Manifold, MD.   New  Prescriptions New Prescriptions   No medications on file     Virgel Manifold, MD 11/07/16 1134

## 2016-11-01 NOTE — ED Notes (Signed)
Patient already in a gown, placed on continuous pulse oximetry and blood pressure cuff; on oxygen [place] (3L) and patient has a foley cath

## 2016-11-02 ENCOUNTER — Encounter (HOSPITAL_COMMUNITY): Payer: Self-pay | Admitting: Student

## 2016-11-02 ENCOUNTER — Encounter: Payer: Self-pay | Admitting: *Deleted

## 2016-11-02 ENCOUNTER — Ambulatory Visit: Payer: Self-pay | Admitting: *Deleted

## 2016-11-02 DIAGNOSIS — M4620 Osteomyelitis of vertebra, site unspecified: Secondary | ICD-10-CM

## 2016-11-02 DIAGNOSIS — I248 Other forms of acute ischemic heart disease: Secondary | ICD-10-CM

## 2016-11-02 DIAGNOSIS — N183 Chronic kidney disease, stage 3 (moderate): Secondary | ICD-10-CM

## 2016-11-02 DIAGNOSIS — R748 Abnormal levels of other serum enzymes: Secondary | ICD-10-CM

## 2016-11-02 DIAGNOSIS — J9601 Acute respiratory failure with hypoxia: Secondary | ICD-10-CM

## 2016-11-02 DIAGNOSIS — I5043 Acute on chronic combined systolic (congestive) and diastolic (congestive) heart failure: Secondary | ICD-10-CM

## 2016-11-02 LAB — BASIC METABOLIC PANEL
ANION GAP: 4 — AB (ref 5–15)
BUN: 20 mg/dL (ref 6–20)
CALCIUM: 8.8 mg/dL — AB (ref 8.9–10.3)
CO2: 33 mmol/L — ABNORMAL HIGH (ref 22–32)
Chloride: 100 mmol/L — ABNORMAL LOW (ref 101–111)
Creatinine, Ser: 1.15 mg/dL — ABNORMAL HIGH (ref 0.44–1.00)
GFR, EST AFRICAN AMERICAN: 53 mL/min — AB (ref >60)
GFR, EST NON AFRICAN AMERICAN: 45 mL/min — AB (ref >60)
GLUCOSE: 194 mg/dL — AB (ref 65–99)
POTASSIUM: 3.8 mmol/L (ref 3.5–5.1)
Sodium: 137 mmol/L (ref 135–145)

## 2016-11-02 LAB — GLUCOSE, CAPILLARY
GLUCOSE-CAPILLARY: 186 mg/dL — AB (ref 65–99)
Glucose-Capillary: 187 mg/dL — ABNORMAL HIGH (ref 65–99)

## 2016-11-02 LAB — TROPONIN I
TROPONIN I: 0.42 ng/mL — AB (ref <0.03)
Troponin I: 0.47 ng/mL (ref <0.03)

## 2016-11-02 MED ORDER — OSMOLITE 1.5 CAL PO LIQD
1000.0000 mL | ORAL | Status: DC
  Administered 2016-11-02 – 2016-11-04 (×3): 1000 mL
  Filled 2016-11-02 (×6): qty 1000

## 2016-11-02 MED ORDER — FREE WATER: 225.0000 mL | Freq: Three times a day (TID) | Status: DC

## 2016-11-02 MED ORDER — FREE WATER
225.0000 mL | Freq: Two times a day (BID) | Status: DC
  Administered 2016-11-02 – 2016-11-05 (×6): 225 mL

## 2016-11-02 MED ORDER — PRO-STAT SUGAR FREE PO LIQD
30.0000 mL | Freq: Every day | ORAL | Status: DC
  Administered 2016-11-03 – 2016-11-05 (×3): 30 mL
  Filled 2016-11-02 (×4): qty 30

## 2016-11-02 MED ORDER — INSULIN ASPART 100 UNIT/ML ~~LOC~~ SOLN: 0.0000 [IU] | Freq: Every day | SUBCUTANEOUS | Status: DC

## 2016-11-02 MED ORDER — INSULIN ASPART 100 UNIT/ML ~~LOC~~ SOLN
0.0000 [IU] | Freq: Three times a day (TID) | SUBCUTANEOUS | Status: DC
  Administered 2016-11-02 – 2016-11-03 (×2): 2 [IU] via SUBCUTANEOUS
  Administered 2016-11-03 (×2): 3 [IU] via SUBCUTANEOUS
  Administered 2016-11-04: 2 [IU] via SUBCUTANEOUS
  Administered 2016-11-04 (×2): 3 [IU] via SUBCUTANEOUS
  Administered 2016-11-05: 2 [IU] via SUBCUTANEOUS
  Administered 2016-11-05: 3 [IU] via SUBCUTANEOUS

## 2016-11-02 MED ORDER — JEVITY 1.2 CAL PO LIQD: 1000.0000 mL | ORAL | Status: DC

## 2016-11-02 NOTE — Progress Notes (Signed)
PROGRESS NOTE    Mandy Rhodes  HQP:[id number] DOB: 1941/10/11 DOA: 11/01/2016  PCP: Vidal Schwalbe, MD   Brief Narrative:  75 y/o female with DM, NICM and chronic combined CHF from suspected cardiac amyloidosis, CKD3, HTN with recent prolonged hospitalization for infected neck hardware with subsequent NSTEMI/SVT and dysphagia discharged to [facility] for rehab.  She presents with dyspnea and hypoxia with pulse ox of 82% on RA, chest pressure and cough with foamy sputum. Her diuretics were discontinued when she was discharged. She is suspected to have a CHF exacerbation.   Subjective: Breathing much better. Today. Cough decreasing.   Assessment & Plan:   Principal Problem:   Acute respiratory failure with hypoxia/  Acute on chronic combined systolic and diastolic congestive heart failure - EF 30-35 % and G2 d CHFon ECHO on 11/17  - likely CHF- cont IV Lasix and ARB - decrease free water per tube -- holding B Blocker in setting of acute CHF - follow daily weights- wean O 2 as able   Active Problems:  Infected cervical hardware with retropharyngeal abscess and vertebral osteomyelitis -status post exploration and irrigation debridement of prevertebral abscess and removal of hardware 10/18/2016 - Unasyn through 12/19 Q 8 hrs per ID- she has a PICC - NS f/u due in 2 wks post op which was 11/29- will ask for consult to follow up on her today    Diabetes mellitus   - holding Amaryl - ISS for now    Elevated troponin - flat trend- likely demand ischemia    CKD (chronic kidney disease) - stable- follow with diuresis    Pressure injury of skin - cont wound care    Anemia of Chronic disease - stable    Dysphagia - cue to C spine issues as above- cont feeds through PEG   DVT prophylaxis: Lovenox Code Status: DNR Family Communication:  Disposition Plan: return to SNF when stable Consultants:    Procedures:    Antimicrobials:  Anti-infectives    Start      Dose/Rate Route Frequency Ordered Stop   11/01/16 2200  ampicillin-sulbactam (UNASYN) IVPB  Status:  Discontinued    Comments:  Indication:  Retropharyngeal abscess Last Day of Therapy:  11/07/16 Labs - Once weekly:  CBC/D and BMP, Labs - Every other week:  ESR and CRP     3 g Intravenous Every 8 hours 11/01/16 2037 11/01/16 2045   11/01/16 2200  Ampicillin-Sulbactam (UNASYN) 3 g in sodium chloride 0.9 % 100 mL IVPB     3 g 200 mL/hr over 30 Minutes Intravenous Every 8 hours 11/01/16 2045         Objective: Vitals:   11/01/16 1900 11/01/16 1930 11/01/16 2106 11/02/16 0609  BP: 123/85 137/80 140/87 117/67  Pulse: 89 86 84 84  Resp:   20 20  Temp:   97.3 F (36.3 C) 97.6 F (36.4 C)  TempSrc:   Oral Oral  SpO2: 97% 99% 100% 99%  Weight:   95.4 kg (210 lb 5.1 oz) 95.7 kg (210 lb 14.4 oz)  Height:   '5\' 6"'  (1.676 m)     Intake/Output Summary (Last 24 hours) at 11/02/16 1132 Last data filed at 11/02/16 0859  Gross per 24 hour  Intake                0 ml  Output             4450 ml  Net            -  4450 ml   Filed Weights   11/01/16 2106 11/02/16 0609  Weight: 95.4 kg (210 lb 5.1 oz) 95.7 kg (210 lb 14.4 oz)    Examination: General exam: Appears comfortable  HEENT: PERRLA, oral mucosa moist, no sclera icterus or thrush Respiratory system: Clear to auscultation anteriorly. Respiratory effort normal. Cardiovascular system: S1 & S2 heard, RRR.  No murmurs  Gastrointestinal system: Abdomen soft, non-tender, nondistended. Normal bowel sound. No organomegaly Central nervous system: Alert and oriented. No focal neurological deficits. Extremities: No cyanosis, clubbing or edema Skin: No rashes or ulcers Psychiatry:  Mood & affect appropriate.     Data Reviewed: I have personally reviewed following labs and imaging studies  CBC:  Recent Labs Lab 11/01/16 1540 11/01/16 2134  WBC 7.4 6.5  NEUTROABS 6.2 4.9  HGB 9.1* 9.1*  HCT 27.9* 27.9*  MCV 87.7 87.2  PLT 184 654    Basic Metabolic Panel:  Recent Labs Lab 10/27/16 0421 11/01/16 1540 11/02/16 0500  NA 141  142 136 137  K 3.2*  3.3* 4.3 3.8  CL 108  109 104 100*  CO2 '24  26 27 ' 33*  GLUCOSE 150*  150* 279* 194*  BUN 8  7 21* 20  CREATININE 1.23*  1.23* 1.12* 1.15*  CALCIUM 8.6*  8.5* 9.1 8.8*  MG 1.8  --   --   PHOS 2.1*  2.2*  --   --    GFR: Estimated Creatinine Clearance: 49.3 mL/min (by C-G formula based on SCr of 1.15 mg/dL (H)). Liver Function Tests:  Recent Labs Lab 10/27/16 0421  AST 15  ALT 19  ALKPHOS 71  BILITOT 0.6  PROT 5.6*  ALBUMIN 1.8*  1.7*   No results for input(s): LIPASE, AMYLASE in the last 168 hours. No results for input(s): AMMONIA in the last 168 hours. Coagulation Profile: No results for input(s): INR, PROTIME in the last 168 hours. Cardiac Enzymes:  Recent Labs Lab 11/01/16 1540 11/01/16 2134 11/02/16 0500 11/02/16 0940  TROPONINI 0.41* 0.44* 0.47* 0.42*   BNP (last 3 results) No results for input(s): PROBNP in the last 8760 hours. HbA1C: No results for input(s): HGBA1C in the last 72 hours. CBG:  Recent Labs Lab 10/26/16 2055 10/27/16 0016 10/27/16 0538 10/27/16 0902 10/27/16 1153  GLUCAP 135* 135* 166* 179* 193*   Lipid Profile: No results for input(s): CHOL, HDL, LDLCALC, TRIG, CHOLHDL, LDLDIRECT in the last 72 hours. Thyroid Function Tests: No results for input(s): TSH, T4TOTAL, FREET4, T3FREE, THYROIDAB in the last 72 hours. Anemia Panel: No results for input(s): VITAMINB12, FOLATE, FERRITIN, TIBC, IRON, RETICCTPCT in the last 72 hours. Urine analysis:    Component Value Date/Time   COLORURINE AMBER (A) 10/13/2016 0503   APPEARANCEUR CLOUDY (A) 10/13/2016 0503   LABSPEC 1.023 10/13/2016 0503   LABSPEC 1.020 12/22/2010 1142   PHURINE 5.0 10/13/2016 0503   GLUCOSEU NEGATIVE 10/13/2016 0503   HGBUR NEGATIVE 10/13/2016 0503   BILIRUBINUR MODERATE (A) 10/13/2016 0503   BILIRUBINUR Negative 12/22/2010 1142    KETONESUR NEGATIVE 10/13/2016 0503   PROTEINUR NEGATIVE 10/13/2016 0503   UROBILINOGEN 0.2 07/15/2015 1349   NITRITE NEGATIVE 10/13/2016 0503   LEUKOCYTESUR NEGATIVE 10/13/2016 0503   LEUKOCYTESUR Negative 12/22/2010 1142   Sepsis Labs: '@LABRCNTIP' (procalcitonin:4,lacticidven:4) )No results found for this or any previous visit (from the past 240 hour(s)).       Radiology Studies: Dg Chest Portable 1 View  Result Date: 11/01/2016 CLINICAL DATA:  Shortness of Breath EXAM: PORTABLE CHEST 1 VIEW COMPARISON:  10/26/2016 FINDINGS: Right-sided PICC line is again identified and stable. Cardiomegaly is again seen. The lungs are well aerated with mild bibasilar atelectatic changes and small left pleural effusion. The overall appearance is stable from the prior exam. The degree of vascular congestion has improved slightly in the interval from the prior study. IMPRESSION: Bibasilar changes stable from the recent exam. Stable right PICC line. Electronically Signed   By: Inez Catalina M.D.   On: 11/01/2016 15:37      Scheduled Meds: . ampicillin-sulbactam (UNASYN) IV  3 g Intravenous Q8H  . aspirin  81 mg Per Tube Daily  . enoxaparin (LOVENOX) injection  40 mg Subcutaneous Q24H  . feeding supplement (ENSURE ENLIVE)  237 mL Oral BID BM  . feeding supplement (OSMOLITE 1.5 CAL)  1,000 mL Per Tube Q24H  . feeding supplement (PRO-STAT SUGAR FREE 64)  30 mL Oral Daily  . free water  225 mL Per Tube Q4H  . furosemide  40 mg Intravenous Q12H  . irbesartan  37.5 mg Oral Daily  . nortriptyline  10 mg Oral QHS  . pantoprazole sodium  80 mg Per Tube Daily  . potassium chloride  40 mEq Per Tube Once  . pravastatin  40 mg Oral QPM  . sodium chloride flush  3 mL Intravenous Q12H   Continuous Infusions:   LOS: 1 day    Time spent in minutes: 29    [place], MD [facility] Pager: [email or web address] Password [email or web address] 11/02/2016, 11:32 AM

## 2016-11-02 NOTE — Evaluation (Signed)
Physical Therapy Evaluation Patient Details Name: Mandy Rhodes MRN: [id number] DOB: 06/09/1941 Today's Date: 11/02/2016   History of Present Illness  Pt is a 75 y/o female admitted 11/01/16 for acute respiratory failure with hypoxemia from CHF exacerbation from [place]. Pt with SOB and weakness, sepsis due to esophageal abscess with perforation 10/21/16-10/28/16. Pt with NSTEMI, VDRF with ETT 11/23-12/1, ACDF hardware removal with I&D. PMHx: DM, CHF, CKD, HTN, anemia, R rotator cuff tear, spinal stenosis, neuropathy, hyperlipidemia.  Clinical Impression  Patient presents with global weakness, deconditioning, cardiopulmonary limitations, and decreased range of motion and would benefit from physical therapy to address these deficits to maximize function. Her prognosis is fair, due to complex PMH and current level of functioning. Patient was kind and tried to assist during mobility, but was hesitant during mobility especially when making efforts to stand. Patient reports being dependent in ADLs and self care at SNF, and that she has not stood since being there. Patient completed session on room air, but upon return to supine had desatted. Patient returned to 5L O2, with increases in sats. RN present and aware in room. Patient returned to 2 L O2 with no more decrease in O2. Will continue to follow.   HR during session: 65-85 SPO2 rest 1 L O2: 97% SPO2 room air: 94% SPO2 post EOB room air: 83-86% SPO2 end of session: 92%     Follow Up Recommendations SNF;Supervision/Assistance - 24 hour    Equipment Recommendations       Recommendations for Other Services       Precautions / Restrictions Precautions Precautions: Fall;Cervical Restrictions Weight Bearing Restrictions: No      Mobility  Bed Mobility Overal bed mobility: Needs Assistance Bed Mobility: Rolling;Sidelying to Sit Rolling: Mod assist;+2 for physical assistance Sidelying to sit: Max assist;+2 for physical assistance    Sit to supine: +2 for physical assistance;Max assist   General bed mobility comments: During rolling, patient required verbal sequencing cues, trunk and pelvic control. To move to sitting, movement of bilateral LEs to EOB, heavy posterior lean, and trunk elevation were required. Steadying needed upon sitting. In order to return to supine, patient required assistance in trunk control in lowering and bilateral LE movement into bed.   Transfers Overall transfer level: Needs assistance   Transfers: Sit to/from Stand Sit to Stand: +2 physical assistance;Max assist;From elevated surface         General transfer comment: Attempted sit to stand x3 with max assist x2 during all attempts. Patient could not advance trunk forward over feet or power up from the bed in order to stand.   Ambulation/Gait             General Gait Details: unable to attempt   Stairs            Wheelchair Mobility    Modified Rankin (Stroke Patients Only)       Balance Overall balance assessment: Needs assistance Sitting-balance support: Bilateral upper extremity supported;Feet supported Sitting balance-Leahy Scale: Poor Sitting balance - Comments: Patient sat EOB for 10 minutes. Patient required assist to keep trunk upright x5. Patient reached approximately 6 inches to her left before feeling unsteady and was unable to reach towards right side due to right shoulder deficits. Patient could only lean forward slightly in sitting.  Postural control: Posterior lean     Standing balance comment: unable to fully stand  Pertinent Vitals/Pain Pain Assessment: 0-10 Pain Score: 7  Pain Location: gluteal region  Pain Descriptors / Indicators: Aching;Throbbing Pain Intervention(s): Monitored during session;Premedicated before session (tylenol )    Home Living Family/patient expects to be discharged to:: Skilled nursing facility Living Arrangements: Alone Available  Help at Discharge: Family;Available PRN/intermittently Type of Home: House Home Access: Stairs to enter   Entrance Stairs-Number of Steps: 3 Home Layout: One level Home Equipment: Walker - 2 wheels;Shower seat;Bedside commode Additional Comments: PLOF related to prior to last admission and SNF stay    Prior Function Level of Independence: Needs assistance   Gait / Transfers Assistance Needed: unable to ambulate during SNF stay. Patient reports staying in bed for most of stay in [place].   ADL's / Homemaking Assistance Needed: completely dependent on SNF staff for ADLs (bathing, cooking, transfers)  Comments: Patient now comes from [facility], where patient reports being dependent. Prior to SNF, patient was using RW, BSC over toilet, shower chair. Patient was also able to do homemaking and driving.      Hand Dominance        Extremity/Trunk Assessment   Upper Extremity Assessment Upper Extremity Assessment: Generalized weakness;RUE deficits/detail RUE Deficits / Details: decreased shoulder abduction and flexion ROM, decreased use of RUE due to previous rotator cuff tear     Lower Extremity Assessment Lower Extremity Assessment: Generalized weakness       Communication   Communication: No difficulties  Cognition Arousal/Alertness: Awake/alert Behavior During Therapy: WFL for tasks assessed/performed;Flat affect Overall Cognitive Status: Within Functional Limits for tasks assessed                      General Comments      Exercises General Exercises - Lower Extremity Heel Slides: AAROM;Both;10 reps;Supine   Assessment/Plan    PT Assessment Patient needs continued PT services  PT Problem List Decreased strength;Decreased range of motion;Decreased activity tolerance;Decreased balance;Decreased mobility;Decreased coordination;Decreased knowledge of use of DME;Cardiopulmonary status limiting activity          PT Treatment Interventions DME instruction;Gait  training;Therapeutic activities;Therapeutic exercise;Patient/family education;Balance training;Functional mobility training    PT Goals (Current goals can be found in the Care Plan section)  Acute Rehab PT Goals Patient Stated Goal: walk PT Goal Formulation: With patient Time For Goal Achievement: 11/16/16 Potential to Achieve Goals: Fair    Frequency Min 2X/week   Barriers to discharge Inaccessible home environment;Decreased caregiver support      Co-evaluation               End of Session Equipment Utilized During Treatment: Gait belt;Oxygen Activity Tolerance: Patient limited by fatigue Patient left: in bed;with call bell/phone within reach;with bed alarm set;with nursing/sitter in room Nurse Communication: Mobility status         Time: [id number] PT Time Calculation (min) (ACUTE ONLY): 40 min   Charges:   PT Evaluation $PT Eval Moderate Complexity: 1 Procedure PT Treatments $Therapeutic Activity: 8-22 mins   PT G Codes:        Axxel Gude 11-06-16, 1:39 PM  Jaziya Obarr SPT [id number]

## 2016-11-02 NOTE — Progress Notes (Signed)
[facility] Nurse wound consult note Reason for Consult: wounds on buttocks and sacrum Wound type:stage 3 (all shallow) Pressure Ulcer POA: Yes Measurement: Sacrum 4cm x 2cm x 0.2cm Right buttock 1.5cm x 1cm x 0.2cm Left buttock proximal wound 2.5cm x 2cm x 0.2cm Left buttock distal wound 1.5cm x 1cm x 0.2cm Wound bed:100% red granulating with MASD surrounding Drainage (amount, consistency, odor) none, no odor Periwound:MASD surrounding wounds Dressing procedure/placement/frequency: I have provided nurses with orders for wound care as follows: To sacrum and buttocks cleanse with soap and water, pat dry, apply foam dressing. Change Q 3 days and prn soiling.  We will not follow, but will remain available to this patient, to nursing, and the medical and/or surgical teams.   Fara Olden, RN-C, WTA-C Wound Treatment Associate

## 2016-11-02 NOTE — Progress Notes (Signed)
   11/02/16 0900  Clinical Encounter Type  Visited With Patient  Visit Type Spiritual support  Referral From Nurse  Consult/Referral To Chaplain  Spiritual Encounters  Spiritual Needs Prayer  Stress Factors  Patient Stress Factors Health changes  Introduction to Pt. Offered prayer of hope and comfort.

## 2016-11-02 NOTE — Progress Notes (Signed)
PT Cancellation Note  Patient Details Name: Mandy Rhodes MRN: [id number] DOB: 10/13/1941   Cancelled Treatment:    Reason Eval/Treat Not Completed: Medical issues which prohibited therapy (pt with recent NSTEMI currently rising troponin and no clearance from cardiology at this point. Will await declining troponin values)   [name] B Jimmey Hengel 11/02/2016, 7:15 AM Elwyn Reach, [place]

## 2016-11-02 NOTE — Consult Note (Signed)
Cardiology Consult    Patient ID: Mandy Rhodes MRN: [id number], DOB/AGE: 07-18-1941   Admit date: 11/01/2016 Date of Consult: 11/02/2016  Primary Physician: Vidal Schwalbe, MD Reason for Consult: CHF Exacerbation Primary Cardiologist: Dr. Debara Pickett Requesting Provider: Dr. Wynelle Cleveland   History of Present Illness    Mandy Rhodes is a 75 y.o. female with PMH of chronic combined CHF (EF 30-35% by echo in 09/2016) due to infiltrative cardiomyopathy, presumed cardiac amyloidosis (by MRI with prior nondiagnostic fat pad biopsy), minimal CAD (by cath 03/2016), DM, HTN, COPD and CKD stage III who presented to [name] from [name] on 11/01/2016 for worsening dyspnea and a productive cough.   She was recently admitted from 11/23 - 10/27/2016 for sepsis with acute respiratory failure due to LLL pneumonia requiring ventilation which was complicated by the presence of infected cervical hardware with retropharyngeal abscess (s/p irrigation and debridement with removal of hardware), recurrent PAT, elevated troponin, acute encephalopathy, AKI on CKD, dysphagia (PEG tube in place) and acute on chronic anemia. Appears her Lasix and Captopril were held at the time of discharge (creatinine had approached baseline of 1.23).   Since hospital discharge, she reports gradual worsening dyspnea for the past 3 days, occurring at rest and with exertion. She noticed lower extremity edema starting yesterday. She denies any chest discomfort or palpitations. At [facility], she was found to be hypoxic with oxygen saturations in the low-80's which prompted her to come to the ED.   Initial labs show a WBC of 7.4, Hgb 9.1, and platelets 184. Creatinine 1.12. K+ 4.3. BNP 2941. Cyclic troponin values have been flat at 0.44, 0.47, and 0.42 (similar to results from previous hospitalization). CXR with bibasilar changes stable from recent exam. EKG shows NSR, HR 88, nonspecific intraventricular conduction delay with frequent  PVC's.   She has been started on IV Lasix 40mg  BID with a net output of -4.3L thus far. Reports improvement in her respiratory status since being admitted. Weight at 210 lbs today (was 203 lbs at time of hospital discharge on 10/27/2016).    Past Medical History   Past Medical History:  Diagnosis Date  . Anemia   . Arthritis   . CAD (coronary artery disease)    a. minimal CAD by cath in 03/2016  . CHF (congestive heart failure) ([place])    a. 09/2016: Echo with EF of 30-35%, moderate diffuse HK, Grade 2 DD, PA peak pressure of 39 mm Hg.  [facility] Kitchen Chronic kidney disease    Renal Insuffiency, see [facility] once a year  . Diabetes mellitus    type 2  . GERD (gastroesophageal reflux disease)   . History of rotator cuff tear    Torn right rotator cuff.  . History of spinal stenosis   . Hypertension   . Infiltrative cardiomyopathy ([place])    a, presumed cardiac amyloidosis by MRI with prior nondiagnostic fat pad biopsy  . Neuropathy associated with endocrine disorder [facility])     Past Surgical History:  Procedure Laterality Date  . ABDOMINAL HYSTERECTOMY  1971   Partial  . ANTERIOR CERVICAL DECOMP/DISCECTOMY FUSION N/A 10/18/2016   Procedure: debridement and drainage cervical prevertebral abscess  ANTERIOR CERVICAL HARDWARE REMOVAL exploration cervical fusion;  Surgeon: Earnie Larsson, MD;  Location: [employer] [facility];  Service: Neurosurgery;  Laterality: N/A;  . APPENDECTOMY    . BACK SURGERY  2009   Disectomy  . CARDIAC CATHETERIZATION N/A 03/22/2016   Procedure: Left Heart Cath and Coronary Angiography;  Surgeon: Collier Salina  M Martinique, MD;  Location: [place] CV LAB;  Service: Cardiovascular;  Laterality: N/A;  . CHOLECYSTECTOMY  1969   Status post   . CYST REMOVAL TRUNK Right 12/03/2014   Procedure: EXCISION OF RIGHT BACK CYST ;  Surgeon: Coralie Keens, MD;  Location: [place];  Service: General;  Laterality: Right;  . DIRECT LARYNGOSCOPY N/A 10/17/2016   Procedure: DIRECT LARYNGOSCOPY,  ESOPHAGOSCOPY;   Surgeon: [name], MD;  Location: [facility];  Service: ENT;  Laterality: N/A;  . FOOT SURGERY Bilateral    bone removed  . IR GENERIC HISTORICAL  10/25/2016   IR GASTROSTOMY TUBE MOD SED 10/25/2016 [name], MD [facility]-INTERV RAD  . NEUROPLASTY / TRANSPOSITION MEDIAN NERVE AT CARPAL TUNNEL Bilateral    Hx  Left carpal tunnel repair  . TRACHEOSTOMY TUBE PLACEMENT N/A 10/17/2016   Procedure: TRACHEOSTOMY;  Surgeon: [name], MD;  Location: [facility];  Service: ENT;  Laterality: N/A;     Allergies  Allergies  Allergen Reactions  . Sulfa Antibiotics Swelling    Inpatient Medications    . ampicillin-sulbactam (UNASYN) IV  3 g Intravenous Q8H  . aspirin  81 mg Per Tube Daily  . enoxaparin (LOVENOX) injection  40 mg Subcutaneous Q24H  . feeding supplement (ENSURE ENLIVE)  237 mL Oral BID BM  . feeding supplement (OSMOLITE 1.5 CAL)  1,000 mL Per Tube Q24H  . feeding supplement (PRO-STAT SUGAR FREE 64)  30 mL Oral Daily  . free water  225 mL Per Tube Q12H  . furosemide  40 mg Intravenous Q12H  . insulin aspart  0-5 Units Subcutaneous QHS  . insulin aspart  0-9 Units Subcutaneous TID WC  . irbesartan  37.5 mg Oral Daily  . nortriptyline  10 mg Oral QHS  . pantoprazole sodium  80 mg Per Tube Daily  . potassium chloride  40 mEq Per Tube Once  . pravastatin  40 mg Oral QPM  . sodium chloride flush  3 mL Intravenous Q12H    Family History    Family History  Problem Relation Age of Onset  . Diabetes type II Mother   . Hypertension Father   . Diabetes type II Sister   . Ovarian cancer Sister   . Diabetes type II Other   . Stroke Neg Hx   . CAD Neg Hx   . Heart failure Neg Hx     Social History    Social History   Social History  . Marital status: Divorced    Spouse name: N/A  . Number of children: N/A  . Years of education: N/A   Occupational History  . retired    Social History Main Topics  . Smoking status: Former Research scientist (life sciences)  . Smokeless tobacco: Never Used      Comment: 12/02/13- quit over 20 years ago  . Alcohol use No  . Drug use: No  . Sexual activity: Not Currently   Other Topics Concern  . Not on file   Social History Narrative  . No narrative on file     Review of Systems    General:  No chills, fever, night sweats or weight changes.  Cardiovascular:  No chest pain, palpitations, paroxysmal nocturnal dyspnea. Positive for orthopnea, lower extremity edema, and dyspnea on exertion.  Dermatological: No rash, lesions/masses Respiratory: Positive for dyspnea and productive cough. Urologic: No hematuria, dysuria Abdominal:   No nausea, vomiting, diarrhea, bright red blood per rectum, melena, or hematemesis Neurologic:  No visual changes, wkns, changes in mental  status. All other systems reviewed and are otherwise negative except as noted above.  Physical Exam    Blood pressure 102/82, pulse 70, temperature 97.3 F (36.3 C), temperature source Axillary, resp. rate 18, height 5\' 6"  (1.676 m), weight 210 lb 14.4 oz (95.7 kg), SpO2 94 %.  General: Pleasant, African American female appearing in NAD. Psych: Normal affect. Neuro: Alert and oriented X 3. Moves all extremities spontaneously. HEENT: Normal  Neck: Supple without bruits. JVD at 9 cm. Lungs:  Resp regular and unlabored, rhonchi along upper lung fields, mild rales at bases bilaterally. Heart: RRR no s3, s4, or murmurs. Abdomen: Soft, non-tender, non-distended, BS + x 4. PEG in place. Extremities: No clubbing or cyanosis. Trace edema along lower extremities bilaterally. DP/PT/Radials 2+ and equal bilaterally.  Labs    Troponin (Point of Care Test) No results for input(s): TROPIPOC in the last 72 hours.  Recent Labs  11/01/16 1540 11/01/16 2134 11/02/16 0500 11/02/16 0940  TROPONINI 0.41* 0.44* 0.47* 0.42*   Lab Results  Component Value Date   WBC 6.5 11/01/2016   HGB 9.1 (L) 11/01/2016   HCT 27.9 (L) 11/01/2016   MCV 87.2 11/01/2016   PLT 181 11/01/2016    Recent  Labs Lab 10/27/16 0421  11/02/16 0500  NA 141  142  < > 137  K 3.2*  3.3*  < > 3.8  CL 108  109  < > 100*  CO2 24  26  < > 33*  BUN 8  7  < > 20  CREATININE 1.23*  1.23*  < > 1.15*  CALCIUM 8.6*  8.5*  < > 8.8*  PROT 5.6*  --   --   BILITOT 0.6  --   --   ALKPHOS 71  --   --   ALT 19  --   --   AST 15  --   --   GLUCOSE 150*  150*  < > 194*  < > = values in this interval not displayed. Lab Results  Component Value Date   CHOL 171 03/18/2016   HDL 56 03/18/2016   LDLCALC 100 (H) 03/18/2016   TRIG 77 03/18/2016   No results found for: [facility]   Radiology Studies    Dg Chest Portable 1 View  Result Date: 11/01/2016 CLINICAL DATA:  Shortness of Breath EXAM: PORTABLE CHEST 1 VIEW COMPARISON:  10/26/2016 FINDINGS: Right-sided PICC line is again identified and stable. Cardiomegaly is again seen. The lungs are well aerated with mild bibasilar atelectatic changes and small left pleural effusion. The overall appearance is stable from the prior exam. The degree of vascular congestion has improved slightly in the interval from the prior study. IMPRESSION: Bibasilar changes stable from the recent exam. Stable right PICC line. Electronically Signed   By: Inez Catalina M.D.   On: 11/01/2016 15:37   EKG & Cardiac Imaging    EKG: NSR, HR 88, nonspecific intraventricular conduction delay with frequent PVC's.   Echocardiogram: 10/13/2016 Study Conclusions  - Left ventricle: The cavity size was normal. There was moderate   concentric hypertrophy. The appearance was consistent with   hypertensive changes or infiltrative cardiomyopathy, such as   amyloidosis. Systolic function was moderately to severely   reduced. The estimated ejection fraction was in the range of 30%   to 35%. Moderate diffuse hypokinesis with no identifiable   regional variations. Features are consistent with a pseudonormal   left ventricular filling pattern, with concomitant abnormal   relaxation and increased  filling pressure (grade 2 diastolic   dysfunction). - Mitral valve: Valve area by pressure half-time: 2.42 cm^2. - Left atrium: The atrium was moderately dilated. - Pulmonary arteries: Systolic pressure was mildly increased. PA   peak pressure: 39 mm Hg (S).  Assessment & Plan    1. Acute on Chronic Combined Systolic and Diastolic CHF/ Nonischemic Cardiomyopathy - EF 30-35% by echo in 09/2016, thought to be due to infiltrative cardiomyopathy (presumed cardiac amyloidosis by MRI).  - when recently discharged on 10/27/2016, her PTA Lasix was discontinued. Weight was 203 lbs at time of discharge, having trended up to 210 lbs. Had developed worsening dyspnea, orthopnea, and edema over the past 3-4 days.  - BNP at 2941 with CXR showing bibasilar changes stable from recent exam. Started on IV Lasix 40mg  BID at time of admission with a net output of -4.3L thus far. Continue with IV Lasix 40mg  BID for now. Continue to monitor daily weights and I&O's.  - restarted on ARB (prior plans mention switching to [facility] as an outpatient). Was not on BB therapy last admission due to bradycardia which has since resolved. Continue to monitor on telemetry and consider initiation of low-dose Coreg prior to discharge if HR and BP allow.   2. Elevated Troponin - cyclic troponin values have been flat at 0.44, 0.47, and 0.42 (similar to results from previous hospitalization).  - cath in 03/2016 showed minimal nonobstructive CAD.  - likely secondary to demand ischemia in the setting of her CHF exacerbation. No further ischemic evaluation indicated at this time.   3. HTN - BP at 102/67 - 144/114 in the past 24 hours.  - restarted on Valsartan 37.5mg  daily.   4. Stage 3 CKD - baseline creatinine is 1.2 - 1.4 - at 1.12 on admission. Follow closely with daily BMET while receiving IV diuresis.   5. Chronic Normocytic Anemia - Hgb 9.1 on admission, improved from 8.2 at the time of recent hospital discharge. - the  patient denies any evidence of active bleeding.   6. Infected cervical hardware with retropharyngeal abscess - s/p irrigation and debridement with removal of hardware on 10/18/2016. PICC in place as she remains on IV antibiotics. - per admitting team   Signed, Erma Heritage, PA-C 11/02/2016, 1:56 PM Pager: [phone]  Personally seen and examined. Agree with above. Agree with IV lasix, continue. Try to meet baseline weight of 203. IV abx for cervical hardware infection Elevated trop - demand ischemia in the setting of ongoing systolic HF and infection therapy. Increases overall cardiac mortality. Currently asleep. Comfortable.  Consider Entresto switch as outpatient.  Candee Furbish, MD

## 2016-11-02 NOTE — Evaluation (Signed)
Occupational Therapy Evaluation Patient Details Name: Mandy Rhodes MRN: [id number] DOB: [birth year] Today's Date: 11/02/2016    History of Present Illness Pt is a 75 y/o female admitted 11/01/16 for acute respiratory failure with hypoxemia from CHF exacerbation from [place]. Pt with SOB and weakness, sepsis due to esophageal abscess with perforation 10/21/16-10/28/16. Pt with NSTEMI, VDRF with ETT 11/23-12/1, ACDF hardware removal with I&D. PMHx: DM, CHF, CKD, HTN, anemia, R rotator cuff tear, spinal stenosis, neuropathy, hyperlipidemia.   Clinical Impression   Pt reports she has been dependent for ADL since she has been at [facility]; prior to that she was independent with BADL. Currently pt total assist for ADL at bed level. Pt declining to perform bed mobility at this time; reports she just sat EOB and got comfortable back in bed. Recommending return to SNF upon d/c for continued rehab prior to return home. Pt would benefit from continued skilled OT to address established goals.    Follow Up Recommendations  SNF;Supervision/Assistance - 24 hour    Equipment Recommendations  Other (comment) (TBD at next venue)    Recommendations for Other Services       Precautions / Restrictions Precautions Precautions: Fall;Cervical Restrictions Weight Bearing Restrictions: No      Mobility Bed Mobility   Transfers     Balance                             ADL Overall ADL's : Needs assistance/impaired Eating/Feeding: NPO                                     General ADL Comments: Total assist for ADL at this time. Pt declining to participate in bed mobility; reports she just sat EOB and was repositioned and is finally comfortable,     Vision     Perception     Praxis      Pertinent Vitals/Pain Pain Assessment: Faces Pain Score: 7  Faces Pain Scale: Hurts little more Pain Location: bottom Pain Descriptors / Indicators: Sore Pain  Intervention(s): Limited activity within patient's tolerance;Monitored during session     Hand Dominance Right   Extremity/Trunk Assessment Upper Extremity Assessment Upper Extremity Assessment: RUE deficits/detail;LUE deficits/detail RUE Deficits / Details: Limited shoulder PROM. Strength-shoulder 2+/5, elbow 3+/5. Poor grip strength. LUE Deficits / Details: Limited shoulder PROM. Strength-shoulder 2+/5, elbow 3+/5. Poor grip strength.   Lower Extremity Assessment Lower Extremity Assessment: Defer to PT evaluation       Communication Communication Communication: No difficulties   Cognition Arousal/Alertness: Awake/alert Behavior During Therapy: WFL for tasks assessed/performed;Flat affect Overall Cognitive Status: Within Functional Limits for tasks assessed                     General Comments       Exercises Exercises: General Upper Extremity (see general UE exercises in flowsheet)     Shoulder Instructions      Home Living Family/patient expects to be discharged to:: Skilled nursing facility Living Arrangements: Alone Available Help at Discharge: Family;Available PRN/intermittently Type of Home: House Home Access: Stairs to enter [employer] of Steps: 3   Home Layout: One level     Bathroom Shower/Tub: Teacher, early years/pre: Standard     Home Equipment: Environmental consultant - 2 wheels;Shower seat;Bedside commode   Additional Comments: [facility] SNF  Prior Functioning/Environment Level of Independence: Needs assistance  Gait / Transfers Assistance Needed: unable to ambulate during SNF stay. Patient reports staying in bed for most of stay in [place].  ADL's / Homemaking Assistance Needed: completely dependent on SNF staff for ADLs at bed level (bathing, dressing)   Comments: Patient now comes from [facility], where patient reports being dependent. Prior to SNF, patient was using RW, BSC over toilet, shower chair. Patient was also able  to do homemaking and driving.         OT Problem List: Decreased strength;Decreased range of motion;Decreased activity tolerance;Impaired balance (sitting and/or standing);Decreased knowledge of use of DME or AE;Obesity;Impaired UE functional use;Pain   OT Treatment/Interventions: Self-care/ADL training;Therapeutic exercise;Energy conservation;DME and/or AE instruction;Therapeutic activities;Patient/family education;Balance training    OT Goals(Current goals can be found in the care plan section) Acute Rehab OT Goals Patient Stated Goal: return to independence OT Goal Formulation: With patient Time For Goal Achievement: 11/16/16 Potential to Achieve Goals: Good ADL Goals Pt Will Perform Grooming: with min assist;sitting Pt/caregiver will Perform Home Exercise Program: Increased ROM;Increased strength;Both right and left upper extremity;With theraband;Independently;With written HEP provided Additional ADL Goal #1: Pt will perform bed mobility with mod assist as precursor to ADL.  OT Frequency: Min 2X/week   Barriers to D/C:            Co-evaluation              End of Session Equipment Utilized During Treatment: Oxygen  Activity Tolerance: Patient limited by fatigue Patient left: in bed;with call bell/phone within reach;with bed alarm set   Time: [id number] OT Time Calculation (min): 15 min Charges:  OT General Charges $OT Visit: 1 Procedure OT Evaluation $OT Eval Moderate Complexity: 1 Procedure G-Codes:     Binnie Kand M.S., OTR/L Pager: [phone]  11/02/2016, 2:52 PM

## 2016-11-02 NOTE — Progress Notes (Addendum)
Initial Nutrition Assessment  DOCUMENTATION CODES:   Obesity unspecified  INTERVENTION:    Increase Osmolite 1.5 formula to goal rate of 50 ml/hr   Prostat liquid protein 30 ml via tube  Total TF regimen to provide 1900 kcals, 90 gm protein, 914 ml of free water  NUTRITION DIAGNOSIS:   Inadequate oral intake related to inability to eat as evidenced by NPO status  GOAL:   Patient will meet greater than or equal to 90% of their needs  MONITOR:   TF tolerance, Labs, Weight trends, Skin, I & O's  REASON FOR ASSESSMENT:   Malnutrition Screening Tool  ASSESSMENT:   75 y/o female admitted 11/01/16 for acute respiratory failure with hypoxemia from CHF exacerbation from [place]. Pt with SOB and weakness, sepsis due to esophageal abscess with perforation 10/21/16-10/28/16. Pt with NSTEMI, VDRF with ETT 11/23-12/1, ACDF hardware removal with I&D. PMHx: DM, CHF, CKD, HTN, anemia, R rotator cuff tear, spinal stenosis, neuropathy, hyperlipidemia.  Pt s/p G-tube placement per IR 12/6. Spoke with RN from [facility]. Pt takes nothing by mouth. Osmolite 1.5 formula currently infusing at 40 ml/hr. Free water flushes 225 ml every 12 hours.  Nutrition focused physical exam completed.  No muscle or subcutaneous fat depletion noticed.  Verbal with Read Back orders received per Dr. Wynelle Cleveland.  Diet Order:  Diet NPO time specified  Skin:  Wound (see comment) (Stage III buttock wound)  Last BM:  12/13  Height:   Ht Readings from Last 1 Encounters:  11/01/16 5\' 6"  (1.676 m)    Weight:   Wt Readings from Last 1 Encounters:  11/02/16 210 lb 14.4 oz (95.7 kg)    Ideal Body Weight:  59.09 kg  BMI:  Body mass index is 34.04 kg/m.  Estimated Nutritional Needs:   Kcal:  1800-2000  Protein:  90-100 gm  Fluid:  1.8-2.0 L  EDUCATION NEEDS:   No education needs identified at this time  Arthur Holms, RD, LDN Pager #: [phone] After-Hours Pager #:  [phone]

## 2016-11-02 NOTE — Progress Notes (Signed)
Inpatient Diabetes Program Recommendations  AACE/ADA: New Consensus Statement on Inpatient Glycemic Control (2015)  Target Ranges:  Prepandial:   less than 140 mg/dL      Peak postprandial:   less than 180 mg/dL (1-2 hours)      Critically ill patients:  140 - 180 mg/dL   Lab Results  Component Value Date   GLUCAP 193 (H) 10/27/2016   HGBA1C 7.1 (H) 10/13/2016    Review of Glycemic Control- lab glucose  Results for TAMIKIA, SIEBERT (MRN [id number]) as of 11/02/2016 11:18  Ref. Range 11/01/2016 15:40 11/01/2016 21:34 11/02/2016 05:00  Glucose Latest Ref Range: 65 - 99 mg/dL 279 (H)  194 (H)    Diabetes history: Type 2 Outpatient Diabetes medications: Amaryl 0.5mg  qday Current orders for Inpatient glycemic control: none  Inpatient Diabetes Program Recommendations:  Please change diet to carb modified/heart healthy diet  Consider ordering CBG tid and hs and Novolog sensitive correction tid, Novolog 0-5 units qhs  Gentry Fitz, RN, [place], [place], CDE Diabetes Coordinator Inpatient Diabetes Program  [phone] (Team Pager) [phone] ([place]) 11/02/2016 11:22 AM

## 2016-11-03 DIAGNOSIS — I472 Ventricular tachycardia: Secondary | ICD-10-CM

## 2016-11-03 DIAGNOSIS — I509 Heart failure, unspecified: Secondary | ICD-10-CM

## 2016-11-03 DIAGNOSIS — E1122 Type 2 diabetes mellitus with diabetic chronic kidney disease: Secondary | ICD-10-CM

## 2016-11-03 LAB — BASIC METABOLIC PANEL
ANION GAP: 4 — AB (ref 5–15)
BUN: 27 mg/dL — ABNORMAL HIGH (ref 6–20)
CALCIUM: 8.3 mg/dL — AB (ref 8.9–10.3)
CO2: 31 mmol/L (ref 22–32)
Chloride: 100 mmol/L — ABNORMAL LOW (ref 101–111)
Creatinine, Ser: 1.24 mg/dL — ABNORMAL HIGH (ref 0.44–1.00)
GFR, EST AFRICAN AMERICAN: 48 mL/min — AB (ref >60)
GFR, EST NON AFRICAN AMERICAN: 41 mL/min — AB (ref >60)
Glucose, Bld: 251 mg/dL — ABNORMAL HIGH (ref 65–99)
POTASSIUM: 3.8 mmol/L (ref 3.5–5.1)
SODIUM: 135 mmol/L (ref 135–145)

## 2016-11-03 LAB — GLUCOSE, CAPILLARY
GLUCOSE-CAPILLARY: 250 mg/dL — AB (ref 65–99)
GLUCOSE-CAPILLARY: 207 mg/dL — AB (ref 65–99)
GLUCOSE-CAPILLARY: 182 mg/dL — AB (ref 65–99)
Glucose-Capillary: 201 mg/dL — ABNORMAL HIGH (ref 65–99)
Glucose-Capillary: 173 mg/dL — ABNORMAL HIGH (ref 65–99)

## 2016-11-03 MED ORDER — GLIMEPIRIDE 1 MG PO TABS
0.5000 mg | ORAL_TABLET | Freq: Every day | ORAL | Status: DC
  Administered 2016-11-04 – 2016-11-05 (×2): 0.5 mg via ORAL
  Filled 2016-11-03 (×3): qty 0.5

## 2016-11-03 MED ORDER — METOPROLOL SUCCINATE ER 25 MG PO TB24
25.0000 mg | ORAL_TABLET | Freq: Every day | ORAL | Status: DC
  Administered 2016-11-03 – 2016-11-05 (×3): 25 mg via ORAL
  Filled 2016-11-03 (×4): qty 1

## 2016-11-03 NOTE — Progress Notes (Addendum)
PROGRESS NOTE    Mandy Rhodes  [id number] DOB: 1941-05-21 DOA: 11/01/2016  PCP: Vidal Schwalbe, MD   Brief Narrative:  75 y/o female with DM, NICM and chronic combined CHF from suspected cardiac amyloidosis, CKD3, HTN with recent prolonged hospitalization for infected neck hardware with subsequent NSTEMI/SVT and dysphagia discharged to [facility] for rehab.  She presents with dyspnea and hypoxia with pulse ox of 82% on RA, chest pressure and cough with foamy sputum. Her diuretics were discontinued when she was discharged. She is suspected to have a CHF exacerbation.   Subjective: No dyspnea or chest pain- mild dry cough which is getting better- no more frothy sputum.   Assessment & Plan:   Principal Problem:   Acute respiratory failure with hypoxia/  Acute on chronic combined systolic and diastolic congestive heart failure - EF 30-35 % and G2 d CHFon ECHO on 11/17  - likely CHF- cont IV Lasix and ARB - decreased free water per tube --B blocker resumed today by cardiology - following daily weights- weaning O 2 - currenlty on room air at 95%   Active Problems:  Infected cervical hardware with retropharyngeal abscess and vertebral osteomyelitis -status post exploration and irrigation debridement of prevertebral abscess and removal of hardware 10/18/2016 - Unasyn through 12/19 Q 8 hrs per ID- she has a PICC - I called NS to determine f/u - it is scheduled for 1/3    Diabetes mellitus   - A1c 7/1- on 11/17 - resumed Amaryl as sugars high- ISS      Elevated troponin - flat trend- likely demand ischemia    CKD (chronic kidney disease) 3 - stable- follow with diuresis    Pressure injury of skin - cont wound care    Anemia of Chronic disease - stable    Dysphagia - cue to C spine issues as above- cont feeds through PEG   DVT prophylaxis: Lovenox Code Status: DNR Family Communication:  Disposition Plan: return to SNF when stable Consultants:     Procedures:    Antimicrobials:  Anti-infectives    Start     Dose/Rate Route Frequency Ordered Stop   11/01/16 2200  ampicillin-sulbactam (UNASYN) IVPB  Status:  Discontinued    Comments:  Indication:  Retropharyngeal abscess Last Day of Therapy:  11/07/16 Labs - Once weekly:  CBC/D and BMP, Labs - Every other week:  ESR and CRP     3 g Intravenous Every 8 hours 11/01/16 2037 11/01/16 2045   11/01/16 2200  Ampicillin-Sulbactam (UNASYN) 3 g in sodium chloride 0.9 % 100 mL IVPB     3 g 200 mL/hr over 30 Minutes Intravenous Every 8 hours 11/01/16 2045         Objective: Vitals:   11/02/16 2140 11/03/16 0500 11/03/16 1016 11/03/16 1039  BP: (!) 103/45 (!) 101/48 112/65   Pulse: 77 74 68   Resp: 18 18    Temp: 97.5 F (36.4 C) 97.3 F (36.3 C)    TempSrc: Oral Oral    SpO2: 100% 100%  95%  Weight:  90.9 kg (200 lb 6.4 oz)    Height:        Intake/Output Summary (Last 24 hours) at 11/03/16 1131 Last data filed at 11/03/16 1100  Gross per 24 hour  Intake              945 ml  Output             4250 ml  Net            -  3305 ml   Filed Weights   11/01/16 2106 11/02/16 0609 11/03/16 0500  Weight: 95.4 kg (210 lb 5.1 oz) 95.7 kg (210 lb 14.4 oz) 90.9 kg (200 lb 6.4 oz)    Examination: General exam: Appears comfortable  HEENT: PERRLA, oral mucosa moist, no sclera icterus or thrush Respiratory system: Clear to auscultation anteriorly. Respiratory effort normal. Cardiovascular system: S1 & S2 heard, RRR.  No murmurs  Gastrointestinal system: Abdomen soft, non-tender, nondistended. Normal bowel sound. No organomegaly- PEG in place Central nervous system: Alert and oriented. No focal neurological deficits. Extremities: No cyanosis, clubbing or edema Skin: No rashes or ulcers Psychiatry:  Mood & affect appropriate.     Data Reviewed: I have personally reviewed following labs and imaging studies  CBC:  Recent Labs Lab 11/01/16 1540 11/01/16 2134  WBC 7.4 6.5   NEUTROABS 6.2 4.9  HGB 9.1* 9.1*  HCT 27.9* 27.9*  MCV 87.7 87.2  PLT 184 626   Basic Metabolic Panel:  Recent Labs Lab 11/01/16 1540 11/02/16 0500 11/03/16 0416  NA 136 137 135  K 4.3 3.8 3.8  CL 104 100* 100*  CO2 27 33* 31  GLUCOSE 279* 194* 251*  BUN 21* 20 27*  CREATININE 1.12* 1.15* 1.24*  CALCIUM 9.1 8.8* 8.3*   GFR: Estimated Creatinine Clearance: 44.5 mL/min (by C-G formula based on SCr of 1.24 mg/dL (H)). Liver Function Tests: No results for input(s): AST, ALT, ALKPHOS, BILITOT, PROT, ALBUMIN in the last 168 hours. No results for input(s): LIPASE, AMYLASE in the last 168 hours. No results for input(s): AMMONIA in the last 168 hours. Coagulation Profile: No results for input(s): INR, PROTIME in the last 168 hours. Cardiac Enzymes:  Recent Labs Lab 11/01/16 1540 11/01/16 2134 11/02/16 0500 11/02/16 0940  TROPONINI 0.41* 0.44* 0.47* 0.42*   BNP (last 3 results) No results for input(s): PROBNP in the last 8760 hours. HbA1C: No results for input(s): HGBA1C in the last 72 hours. CBG:  Recent Labs Lab 10/27/16 1153 11/02/16 1625 11/02/16 2253 11/03/16 0654 11/03/16 0733  GLUCAP 193* 186* 187* 250* 207*   Lipid Profile: No results for input(s): CHOL, HDL, LDLCALC, TRIG, CHOLHDL, LDLDIRECT in the last 72 hours. Thyroid Function Tests: No results for input(s): TSH, T4TOTAL, FREET4, T3FREE, THYROIDAB in the last 72 hours. Anemia Panel: No results for input(s): VITAMINB12, FOLATE, FERRITIN, TIBC, IRON, RETICCTPCT in the last 72 hours. Urine analysis:    Component Value Date/Time   COLORURINE AMBER (A) 10/13/2016 0503   APPEARANCEUR CLOUDY (A) 10/13/2016 0503   LABSPEC 1.023 10/13/2016 0503   LABSPEC 1.020 12/22/2010 1142   PHURINE 5.0 10/13/2016 0503   GLUCOSEU NEGATIVE 10/13/2016 0503   HGBUR NEGATIVE 10/13/2016 0503   BILIRUBINUR MODERATE (A) 10/13/2016 0503   BILIRUBINUR Negative 12/22/2010 1142   KETONESUR NEGATIVE 10/13/2016 0503    PROTEINUR NEGATIVE 10/13/2016 0503   UROBILINOGEN 0.2 07/15/2015 1349   NITRITE NEGATIVE 10/13/2016 0503   LEUKOCYTESUR NEGATIVE 10/13/2016 0503   LEUKOCYTESUR Negative 12/22/2010 1142   Sepsis Labs: _0 (procalcitonin:4,lacticidven:4) )No results found for this or any previous visit (from the past 240 hour(s)).       Radiology Studies: Dg Chest Portable 1 View  Result Date: 11/01/2016 CLINICAL DATA:  Shortness of Breath EXAM: PORTABLE CHEST 1 VIEW COMPARISON:  10/26/2016 FINDINGS: Right-sided PICC line is again identified and stable. Cardiomegaly is again seen. The lungs are well aerated with mild bibasilar atelectatic changes and small left pleural effusion. The overall appearance is stable from the prior exam.  The degree of vascular congestion has improved slightly in the interval from the prior study. IMPRESSION: Bibasilar changes stable from the recent exam. Stable right PICC line. Electronically Signed   By: Inez Catalina M.D.   On: 11/01/2016 15:37      Scheduled Meds: . ampicillin-sulbactam (UNASYN) IV  3 g Intravenous Q8H  . aspirin  81 mg Per Tube Daily  . enoxaparin (LOVENOX) injection  40 mg Subcutaneous Q24H  . feeding supplement (PRO-STAT SUGAR FREE 64)  30 mL Per Tube Daily  . free water  225 mL Per Tube Q12H  . furosemide  40 mg Intravenous Q12H  . glimepiride  0.5 mg Oral Q breakfast  . insulin aspart  0-5 Units Subcutaneous QHS  . insulin aspart  0-9 Units Subcutaneous TID WC  . irbesartan  37.5 mg Oral Daily  . metoprolol succinate  25 mg Oral Daily  . nortriptyline  10 mg Oral QHS  . pantoprazole sodium  80 mg Per Tube Daily  . pravastatin  40 mg Oral QPM  . sodium chloride flush  3 mL Intravenous Q12H   Continuous Infusions: . feeding supplement (OSMOLITE 1.5 CAL) 1,000 mL (11/02/16 2338)     LOS: 2 days    Time spent in minutes: 38    [place], MD [facility] Pager: [email or web address] Password [email or web address] 11/03/2016, 11:31 AM

## 2016-11-03 NOTE — Clinical Social Work Note (Signed)
Clinical Social Work Assessment  Patient Details  Name: Mandy Rhodes MRN: [id number] Date of Birth: 04/10/1941  Date of referral:  11/03/16               Reason for consult:  Discharge Planning                Permission sought to share information with:  Family Supports Permission granted to share information::  Yes, Verbal Permission Granted  Name::     Mandy Rhodes  Agency::     Relationship::  daughter  Contact Information:  [phone]  Housing/Transportation Living arrangements for the past 2 months:  [place] of Information:  Patient Patient Interpreter Needed:  None Criminal Activity/Legal Involvement Pertinent to Current Situation/Hospitalization:  No - Comment as needed Significant Relationships:  Adult Children Lives with:  Self Do you feel safe going back to the place where you live?  Yes Need for family participation in patient care:  No (Coment)  Care giving concerns: No family/friends at bedside. Patient stated she has granddaughter in the area but the rest of her family is in the [place] and [place] area.   Social Worker assessment / plan:  Holiday representative met patient at bedside to offer support and discuss patient's needs at discharge. Patient stated that before coming to [facility] she was at [facility] for rehab and would like to go back. CSW had spoken to [facility] admission coordinator for [place] and she stated patient can come back once medically ready. Patient gave me verbal permission to contact family to inform them of patients discharge plans. CSW to complete necessary paperwork and initiate placement. CSW remains available for support and to facilitate patient discharge needs once medically ready.   Employment status:  Retired Forensic scientist:  Medicare PT Recommendations:  [place] / Referral to community resources:  [place]  Patient/Family's Response to care:  Patient  verbalized appreciation and understanding for CSW role and involvement in care. Patient agreeable with current discharge plan back to SNF  Patient/Family's Understanding of and Emotional Response to Diagnosis, Current Treatment, and Prognosis:  Patient with good understanding of current medical state and limitations around most recent hospitalization. Patent agreeable with SNF placement in hopes of transitioning back home.   Emotional Assessment Appearance:  Appears stated age Attitude/Demeanor/Rapport:   (attitude/demoanor appropriate) Affect (typically observed):  Pleasant Orientation:  Oriented to Self, Oriented to Situation, Oriented to Place, Oriented to  Time Alcohol / Substance use:  Not Applicable Psych involvement (Current and /or in the community):  No (Comment)  Discharge Needs  Concerns to be addressed:  Discharge Planning Concerns Readmission within the last 30 days:  No Current discharge risk:  None Barriers to Discharge:  No Barriers Identified   Rhea Pink, MSW,  [place]

## 2016-11-03 NOTE — Progress Notes (Signed)
Inpatient Diabetes Program Recommendations  AACE/ADA: New Consensus Statement on Inpatient Glycemic Control (2015)  Target Ranges:  Prepandial:   less than 140 mg/dL      Peak postprandial:   less than 180 mg/dL (1-2 hours)      Critically ill patients:  140 - 180 mg/dL   Lab Results  Component Value Date   GLUCAP 207 (H) 11/03/2016   HGBA1C 7.1 (H) 10/13/2016    Review of Glycemic Control  Results for TAMA, WALTERMIRE (MRN [id number]) as of 11/03/2016 10:11  Ref. Range 10/27/2016 11:53 11/02/2016 16:25 11/02/2016 22:53 11/03/2016 06:54 11/03/2016 07:33  Glucose-Capillary Latest Ref Range: 65 - 99 mg/dL 193 (H) 186 (H) 187 (H) 250 (H) 207 (H)    Diabetes history: Type 2 Outpatient Diabetes medications: Amaryl 0.5mg  qday Current orders for Inpatient glycemic control:  Novolog 0-9 units tid, Novolog 0-5units qhs  Inpatient Diabetes Program Recommendations:  Consider discontinuing Amaryl and consider starting Lantus 18 units qhs (0.2units/kg) and change Novolog correction to q4h since the patient is NPO   Gentry Fitz, RN, [place], [place], CDE Diabetes Coordinator Inpatient Diabetes Program  [phone] (Team Pager) [phone] ([place]) 11/03/2016 10:16 AM

## 2016-11-03 NOTE — Progress Notes (Signed)
Patient Name: Mandy Rhodes Date of Encounter: 11/03/2016  Primary Cardiologist: Dr. [facility] Problem List     Principal Problem:   Acute respiratory failure with hypoxia [facility]) Active Problems:   Diabetes mellitus (HCC)   Elevated troponin   CKD (chronic kidney disease)   Acute on chronic combined systolic and diastolic congestive heart failure (HCC)   Pressure injury of skin   Anemia   Vertebral osteomyelitis (HCC)   Dysphagia     Subjective   Overall improved, decreased SOB, no CP  Inpatient Medications    Scheduled Meds: . ampicillin-sulbactam (UNASYN) IV  3 g Intravenous Q8H  . aspirin  81 mg Per Tube Daily  . enoxaparin (LOVENOX) injection  40 mg Subcutaneous Q24H  . feeding supplement (PRO-STAT SUGAR FREE 64)  30 mL Per Tube Daily  . free water  225 mL Per Tube Q12H  . furosemide  40 mg Intravenous Q12H  . glimepiride  0.5 mg Oral Q breakfast  . insulin aspart  0-5 Units Subcutaneous QHS  . insulin aspart  0-9 Units Subcutaneous TID WC  . irbesartan  37.5 mg Oral Daily  . nortriptyline  10 mg Oral QHS  . pantoprazole sodium  80 mg Per Tube Daily  . pravastatin  40 mg Oral QPM  . sodium chloride flush  3 mL Intravenous Q12H   Continuous Infusions: . feeding supplement (OSMOLITE 1.5 CAL) 1,000 mL (11/02/16 2338)   PRN Meds: sodium chloride, acetaminophen, ondansetron (ZOFRAN) IV, sodium chloride flush, sodium chloride flush   Vital Signs    Vitals:   11/02/16 1306 11/02/16 1341 11/02/16 2140 11/03/16 0500  BP:  102/82 (!) 103/45 (!) 101/48  Pulse: 79 70 77 74  Resp:  18 18 18   Temp:  97.3 F (36.3 C) 97.5 F (36.4 C) 97.3 F (36.3 C)  TempSrc:  Axillary Oral Oral  SpO2: 97% 94% 100% 100%  Weight:    200 lb 6.4 oz (90.9 kg)  Height:        Intake/Output Summary (Last 24 hours) at 11/03/16 0831 Last data filed at 11/03/16 [id number]  Gross per 24 hour  Intake              945 ml  Output             5450 ml  Net            -4505 ml    Filed Weights   11/01/16 2106 11/02/16 0609 11/03/16 0500  Weight: 210 lb 5.1 oz (95.4 kg) 210 lb 14.4 oz (95.7 kg) 200 lb 6.4 oz (90.9 kg)    Physical Exam    General: Pleasant, African American female appearing in NAD. Psych: Normal affect. Neuro: Alert and oriented X 3. Moves all extremities spontaneously. HEENT: Normal           Neck: Supple without bruits. JVD at 9 cm. Lungs:  Resp regular and unlabored, rhonchi along upper lung fields, mild rales at bases bilaterally. Heart: RRR no s3, s4, or murmurs. Abdomen: Soft, non-tender, non-distended, BS + x 4. PEG in place. Extremities: No clubbing or cyanosis. Trace edema along lower extremities bilaterally. DP/PT/Radials 2+ and equal bilaterally.  Labs    CBC  Recent Labs  11/01/16 1540 11/01/16 2134  WBC 7.4 6.5  NEUTROABS 6.2 4.9  HGB 9.1* 9.1*  HCT 27.9* 27.9*  MCV 87.7 87.2  PLT 184 0000000   Basic Metabolic Panel  Recent Labs  11/02/16 0500 11/03/16 0416  NA  137 135  K 3.8 3.8  CL 100* 100*  CO2 33* 31  GLUCOSE 194* 251*  BUN 20 27*  CREATININE 1.15* 1.24*  CALCIUM 8.8* 8.3*   Liver Function Tests No results for input(s): AST, ALT, ALKPHOS, BILITOT, PROT, ALBUMIN in the last 72 hours. No results for input(s): LIPASE, AMYLASE in the last 72 hours. Cardiac Enzymes  Recent Labs  11/01/16 2134 11/02/16 0500 11/02/16 0940  TROPONINI 0.44* 0.47* 0.42*   BNP Invalid input(s): POCBNP D-Dimer No results for input(s): DDIMER in the last 72 hours. Hemoglobin A1C No results for input(s): HGBA1C in the last 72 hours. Fasting Lipid Panel No results for input(s): CHOL, HDL, LDLCALC, TRIG, CHOLHDL, LDLDIRECT in the last 72 hours. Thyroid Function Tests No results for input(s): TSH, T4TOTAL, T3FREE, THYROIDAB in the last 72 hours.  Invalid input(s): FREET3  Telemetry    20 beats of NSVT at 9pm on 12/14 - Personally Reviewed  ECG    NSR with NSSTW changes.  - Personally Reviewed  Radiology    Dg  Chest Portable 1 View  Result Date: 11/01/2016 CLINICAL DATA:  Shortness of Breath EXAM: PORTABLE CHEST 1 VIEW COMPARISON:  10/26/2016 FINDINGS: Right-sided PICC line is again identified and stable. Cardiomegaly is again seen. The lungs are well aerated with mild bibasilar atelectatic changes and small left pleural effusion. The overall appearance is stable from the prior exam. The degree of vascular congestion has improved slightly in the interval from the prior study. IMPRESSION: Bibasilar changes stable from the recent exam. Stable right PICC line. Electronically Signed   By: Inez Catalina M.D.   On: 11/01/2016 15:37    Cardiac Studies   Echocardiogram: 10/13/2016 Study Conclusions  - Left ventricle: The cavity size was normal. There was moderate concentric hypertrophy. The appearance was consistent with hypertensive changes or infiltrative cardiomyopathy, such as amyloidosis. Systolic function was moderately to severely reduced. The estimated ejection fraction was in the range of 30% to 35%. Moderate diffuse hypokinesis with no identifiable regional variations. Features are consistent with a pseudonormal left ventricular filling pattern, with concomitant abnormal relaxation and increased filling pressure (grade 2 diastolic dysfunction). - Mitral valve: Valve area by pressure half-time: 2.42 cm^2. - Left atrium: The atrium was moderately dilated. - Pulmonary arteries: Systolic pressure was mildly increased. PA peak pressure: 39 mm Hg (S).  Patient Profile     75 y.o. female with PMH of chronic combined CHF (EF 30-35% by echo in 09/2016) due to infiltrative cardiomyopathy, presumed cardiac amyloidosis (by MRI with prior nondiagnostic fat pad biopsy), minimal CAD (by cath 03/2016), DM, HTN, COPD and CKD stage III who presented to [name] from [place] on 11/01/2016 for worsening dyspnea and a productive cough.   Assessment & Plan    1. Acute on Chronic  Combined Systolic and Diastolic CHF/ Nonischemic Cardiomyopathy - EF 30-35% by echo in 09/2016, thought to be due to infiltrative cardiomyopathy (presumed cardiac amyloidosis by MRI).  - when recently discharged on 10/27/2016, her PTA Lasix was discontinued. Weight was 203 lbs at time of discharge, having trended up to 210 lbs. Had developed worsening dyspnea, orthopnea, and edema over the past 3-4 days.  Weight now 200. Still has more fluid to pull off.  - BNP at 2941 with CXR showing bibasilar changes stable from recent exam. Started on IV Lasix 40mg  BID at time of admission with a net output of -7.7L thus far. Continue with IV Lasix 40mg  BID for now. Continue to monitor daily weights  and I&O's. May be able to switch to PO tomorrow.  - restarted on ARB (prior plans mention switching to [facility] as an outpatient). Was not on BB therapy last admission due to bradycardia which has since resolved. Will start low dose metoprolol XL 25mg  PO QD.  2. Elevated Troponin - cyclic troponin values have been flat at 0.44, 0.47, and 0.42 (similar to results from previous hospitalization).  - cath in 03/2016 showed minimal nonobstructive CAD.  - likely secondary to demand ischemia in the setting of her CHF exacerbation. No further ischemic evaluation indicated at this time.   3. HTN - BP at 102/67 - 144/114 in the past 24 hours.  - restarted on Valsartan 37.5mg  daily.   4. Stage 3 CKD - baseline creatinine is 1.2 - 1.4 - at 1.12 on admission. Follow closely with daily BMET while receiving IV diuresis.   5. Chronic Normocytic Anemia - Hgb 9.1 on admission, improved from 8.2 at the time of recent hospital discharge. - the patient denies any evidence of active bleeding.   6. Infected cervical hardware with retropharyngeal abscess - s/p irrigation and debridement with removal of hardware on 10/18/2016. PICC in place as she remains on IV antibiotics. - per admitting team  7. Non sustained VT  - 20 beat  run at 9pm on 12/14. Asymptomatic  - Will start metoprolol XL 25mg  QD. (BP has been low in the past)   Signed, Candee Furbish, MD  11/03/2016, 8:31 AM

## 2016-11-04 ENCOUNTER — Inpatient Hospital Stay (HOSPITAL_COMMUNITY): Payer: Medicare Other

## 2016-11-04 DIAGNOSIS — R609 Edema, unspecified: Secondary | ICD-10-CM

## 2016-11-04 LAB — BASIC METABOLIC PANEL
Anion gap: 7 (ref 5–15)
BUN: 26 mg/dL — AB (ref 6–20)
CHLORIDE: 99 mmol/L — AB (ref 101–111)
CO2: 31 mmol/L (ref 22–32)
Calcium: 8.2 mg/dL — ABNORMAL LOW (ref 8.9–10.3)
Creatinine, Ser: 1.2 mg/dL — ABNORMAL HIGH (ref 0.44–1.00)
GFR calc Af Amer: 50 mL/min — ABNORMAL LOW (ref >60)
GFR calc non Af Amer: 43 mL/min — ABNORMAL LOW (ref >60)
GLUCOSE: 238 mg/dL — AB (ref 65–99)
POTASSIUM: 3.5 mmol/L (ref 3.5–5.1)
Sodium: 137 mmol/L (ref 135–145)

## 2016-11-04 LAB — GLUCOSE, CAPILLARY
GLUCOSE-CAPILLARY: 189 mg/dL — AB (ref 65–99)
GLUCOSE-CAPILLARY: 158 mg/dL — AB (ref 65–99)
Glucose-Capillary: 216 mg/dL — ABNORMAL HIGH (ref 65–99)
Glucose-Capillary: 236 mg/dL — ABNORMAL HIGH (ref 65–99)
Glucose-Capillary: 183 mg/dL — ABNORMAL HIGH (ref 65–99)
Glucose-Capillary: 221 mg/dL — ABNORMAL HIGH (ref 65–99)

## 2016-11-04 MED ORDER — ORAL CARE MOUTH RINSE
15.0000 mL | Freq: Two times a day (BID) | OROMUCOSAL | Status: DC
  Administered 2016-11-04 – 2016-11-05 (×2): 15 mL via OROMUCOSAL

## 2016-11-04 NOTE — Progress Notes (Signed)
PROGRESS NOTE    Mandy Rhodes  [id number] DOB: 05-Jun-1941 DOA: 11/01/2016  PCP: Vidal Schwalbe, MD   Brief Narrative:  75 y/o female with DM, NICM and chronic combined CHF from suspected cardiac amyloidosis, CKD3, HTN with recent prolonged hospitalization for infected neck hardware with subsequent NSTEMI/SVT and dysphagia discharged to [facility] for rehab.  She presents with dyspnea and hypoxia with pulse ox of 82% on RA, chest pressure and cough with foamy sputum. Her diuretics were discontinued when she was discharged. She is suspected to have a CHF exacerbation.   Subjective: No complaints today.   Assessment & Plan:   Principal Problem:   Acute respiratory failure with hypoxia/  Acute on chronic combined systolic and diastolic congestive heart failure - EF 30-35 % and G2 d CHFon ECHO on 11/17  - likely CHF- cont IV Lasix and ARB - decreased free water per tube --B blocker resumed  - following daily weights- weaning O 2 Filed Weights   11/02/16 0609 11/03/16 0500 11/04/16 0500  Weight: 95.7 kg (210 lb 14.4 oz) 90.9 kg (200 lb 6.4 oz) 88 kg (194 lb)     Active Problems:  Infected cervical hardware with retropharyngeal abscess and vertebral osteomyelitis -status post exploration and irrigation debridement of prevertebral abscess and removal of hardware 10/18/2016 - Unasyn through 12/19 Q 8 hrs per ID- she has a PICC - I called NS to determine f/u - it is scheduled for 1/3    Diabetes mellitus   - A1c 7/1- on 11/17 - resumed Amaryl as sugars high- ISS      Elevated troponin - flat trend- likely demand ischemia    CKD (chronic kidney disease) 3 - stable- follow with diuresis    Pressure injury of skin - cont wound care    Anemia of Chronic disease - stable    Dysphagia - cue to C spine issues as above- cont feeds through PEG   DVT prophylaxis: Lovenox Code Status: DNR Family Communication:  Disposition Plan: return to SNF when  stable Consultants:    Procedures:    Antimicrobials:  Anti-infectives    Start     Dose/Rate Route Frequency Ordered Stop   11/01/16 2200  ampicillin-sulbactam (UNASYN) IVPB  Status:  Discontinued    Comments:  Indication:  Retropharyngeal abscess Last Day of Therapy:  11/07/16 Labs - Once weekly:  CBC/D and BMP, Labs - Every other week:  ESR and CRP     3 g Intravenous Every 8 hours 11/01/16 2037 11/01/16 2045   11/01/16 2200  Ampicillin-Sulbactam (UNASYN) 3 g in sodium chloride 0.9 % 100 mL IVPB     3 g 200 mL/hr over 30 Minutes Intravenous Every 8 hours 11/01/16 2045         Objective: Vitals:   11/03/16 2050 11/04/16 0500 11/04/16 0847 11/04/16 0944  BP: (!) 107/55 (!) 110/50 97/64 (!) 101/55  Pulse: 70 74 79 78  Resp: _0 Temp: 97.7 F (36.5 C) 97.9 F (36.6 C) 98 F (36.7 C)   TempSrc: Oral Oral Oral   SpO2: 99% 98% (!) 2%   Weight:  88 kg (194 lb)    Height:        Intake/Output Summary (Last 24 hours) at 11/04/16 1410 Last data filed at 11/04/16 1000  Gross per 24 hour  Intake             1280 ml  Output  4450 ml  Net            -3170 ml   Filed Weights   11/02/16 0609 11/03/16 0500 11/04/16 0500  Weight: 95.7 kg (210 lb 14.4 oz) 90.9 kg (200 lb 6.4 oz) 88 kg (194 lb)    Examination: General exam: Appears comfortable  HEENT: PERRLA, oral mucosa moist, no sclera icterus or thrush Respiratory system: Clear to auscultation anteriorly. Respiratory effort normal. Cardiovascular system: S1 & S2 heard, RRR.  No murmurs  Gastrointestinal system: Abdomen soft, non-tender, nondistended. Normal bowel sound. No organomegaly- PEG in place Central nervous system: Alert and oriented. No focal neurological deficits. Extremities: No cyanosis, clubbing or edema Skin: No rashes or ulcers Psychiatry:  Mood & affect appropriate.     Data Reviewed: I have personally reviewed following labs and imaging studies  CBC:  Recent Labs Lab  11/01/16 1540 11/01/16 2134  WBC 7.4 6.5  NEUTROABS 6.2 4.9  HGB 9.1* 9.1*  HCT 27.9* 27.9*  MCV 87.7 87.2  PLT 184 433   Basic Metabolic Panel:  Recent Labs Lab 11/01/16 1540 11/02/16 0500 11/03/16 0416 11/04/16 0439  NA 136 137 135 137  K 4.3 3.8 3.8 3.5  CL 104 100* 100* 99*  CO2 27 33* 31 31  GLUCOSE 279* 194* 251* 238*  BUN 21* 20 27* 26*  CREATININE 1.12* 1.15* 1.24* 1.20*  CALCIUM 9.1 8.8* 8.3* 8.2*   GFR: Estimated Creatinine Clearance: 45.3 mL/min (by C-G formula based on SCr of 1.2 mg/dL (H)). Liver Function Tests: No results for input(s): AST, ALT, ALKPHOS, BILITOT, PROT, ALBUMIN in the last 168 hours. No results for input(s): LIPASE, AMYLASE in the last 168 hours. No results for input(s): AMMONIA in the last 168 hours. Coagulation Profile: No results for input(s): INR, PROTIME in the last 168 hours. Cardiac Enzymes:  Recent Labs Lab 11/01/16 1540 11/01/16 2134 11/02/16 0500 11/02/16 0940  TROPONINI 0.41* 0.44* 0.47* 0.42*   BNP (last 3 results) No results for input(s): PROBNP in the last 8760 hours. HbA1C: No results for input(s): HGBA1C in the last 72 hours. CBG:  Recent Labs Lab 11/03/16 2042 11/04/16 0112 11/04/16 0507 11/04/16 0846 11/04/16 1205  GLUCAP 173* 216* 236* 183* 221*   Lipid Profile: No results for input(s): CHOL, HDL, LDLCALC, TRIG, CHOLHDL, LDLDIRECT in the last 72 hours. Thyroid Function Tests: No results for input(s): TSH, T4TOTAL, FREET4, T3FREE, THYROIDAB in the last 72 hours. Anemia Panel: No results for input(s): VITAMINB12, FOLATE, FERRITIN, TIBC, IRON, RETICCTPCT in the last 72 hours. Urine analysis:    Component Value Date/Time   COLORURINE AMBER (A) 10/13/2016 0503   APPEARANCEUR CLOUDY (A) 10/13/2016 0503   LABSPEC 1.023 10/13/2016 0503   LABSPEC 1.020 12/22/2010 1142   PHURINE 5.0 10/13/2016 0503   GLUCOSEU NEGATIVE 10/13/2016 0503   HGBUR NEGATIVE 10/13/2016 0503   BILIRUBINUR MODERATE (A) 10/13/2016  0503   BILIRUBINUR Negative 12/22/2010 1142   KETONESUR NEGATIVE 10/13/2016 0503   PROTEINUR NEGATIVE 10/13/2016 0503   UROBILINOGEN 0.2 07/15/2015 1349   NITRITE NEGATIVE 10/13/2016 0503   LEUKOCYTESUR NEGATIVE 10/13/2016 0503   LEUKOCYTESUR Negative 12/22/2010 1142   Sepsis Labs: _0 (procalcitonin:4,lacticidven:4) )No results found for this or any previous visit (from the past 240 hour(s)).       Radiology Studies: No results found.    Scheduled Meds: . ampicillin-sulbactam (UNASYN) IV  3 g Intravenous Q8H  . aspirin  81 mg Per Tube Daily  . enoxaparin (LOVENOX) injection  40 mg Subcutaneous Q24H  .  feeding supplement (PRO-STAT SUGAR FREE 64)  30 mL Per Tube Daily  . free water  225 mL Per Tube Q12H  . furosemide  40 mg Intravenous Q12H  . glimepiride  0.5 mg Oral Q breakfast  . insulin aspart  0-5 Units Subcutaneous QHS  . insulin aspart  0-9 Units Subcutaneous TID WC  . irbesartan  37.5 mg Oral Daily  . metoprolol succinate  25 mg Oral Daily  . nortriptyline  10 mg Oral QHS  . pantoprazole sodium  80 mg Per Tube Daily  . pravastatin  40 mg Oral QPM  . sodium chloride flush  3 mL Intravenous Q12H   Continuous Infusions: . feeding supplement (OSMOLITE 1.5 CAL) 1,000 mL (11/03/16 2257)     LOS: 3 days    Time spent in minutes: 61    Mosby, MD [facility] Pager: [email or web address] Password [email or web address] 11/04/2016, 2:10 PM

## 2016-11-04 NOTE — Progress Notes (Signed)
*  PRELIMINARY RESULTS* Vascular Ultrasound Right upper extremity venous duplex has been completed.  Preliminary findings: visualized veins of the right upper extremity are negative for deep vein thrombosis.  Everrett Coombe 11/04/2016, 10:46 AM

## 2016-11-04 NOTE — Progress Notes (Signed)
Patient ID: Mandy Rhodes, female   DOB: 1941/04/20, 75 y.o.   MRN: [id number]   Patient Name: Mandy Rhodes Date of Encounter: 11/04/2016  Primary Cardiologist: Dr. [facility] Problem List     Principal Problem:   Acute respiratory failure with hypoxia [facility]) Active Problems:   Diabetes mellitus (HCC)   Elevated troponin   CKD (chronic kidney disease)   Acute on chronic combined systolic and diastolic congestive heart failure (HCC)   Pressure injury of skin   Anemia   Vertebral osteomyelitis (HCC)   Dysphagia   Heart failure (HCC)     Subjective   Overall improved, decreased SOB, no CP  Inpatient Medications    Scheduled Meds: . ampicillin-sulbactam (UNASYN) IV  3 g Intravenous Q8H  . aspirin  81 mg Per Tube Daily  . enoxaparin (LOVENOX) injection  40 mg Subcutaneous Q24H  . feeding supplement (PRO-STAT SUGAR FREE 64)  30 mL Per Tube Daily  . free water  225 mL Per Tube Q12H  . furosemide  40 mg Intravenous Q12H  . glimepiride  0.5 mg Oral Q breakfast  . insulin aspart  0-5 Units Subcutaneous QHS  . insulin aspart  0-9 Units Subcutaneous TID WC  . irbesartan  37.5 mg Oral Daily  . metoprolol succinate  25 mg Oral Daily  . nortriptyline  10 mg Oral QHS  . pantoprazole sodium  80 mg Per Tube Daily  . pravastatin  40 mg Oral QPM  . sodium chloride flush  3 mL Intravenous Q12H   Continuous Infusions: . feeding supplement (OSMOLITE 1.5 CAL) 1,000 mL (11/03/16 2257)   PRN Meds: sodium chloride, acetaminophen, ondansetron (ZOFRAN) IV, sodium chloride flush, sodium chloride flush   Vital Signs    Vitals:   11/03/16 1351 11/03/16 2050 11/04/16 0500 11/04/16 0847  BP: 112/64 (!) 107/55 (!) 110/50 97/64  Pulse: 74 70 74 79  Resp: 18 18 18 18   Temp: 97.8 F (36.6 C) 97.7 F (36.5 C) 97.9 F (36.6 C) 98 F (36.7 C)  TempSrc: Oral Oral Oral Oral  SpO2: 100% 99% 98% (!) 2%  Weight:   194 lb (88 kg)   Height:        Intake/Output Summary (Last 24  hours) at 11/04/16 0913 Last data filed at 11/04/16 0700  Gross per 24 hour  Intake             1055 ml  Output             4450 ml  Net            -3395 ml   Filed Weights   11/02/16 0609 11/03/16 0500 11/04/16 0500  Weight: 210 lb 14.4 oz (95.7 kg) 200 lb 6.4 oz (90.9 kg) 194 lb (88 kg)    Physical Exam    General: Pleasant, African American female appearing in NAD. Psych: Normal affect. Neuro: Alert and oriented X 3. Moves all extremities spontaneously. HEENT: Normal           Neck: Supple without bruits. JVD at 9 cm. Lungs:  Resp regular and unlabored, rhonchi along upper lung fields, mild rales at bases bilaterally. Heart: RRR no s3, s4, or murmurs. Abdomen: Soft, non-tender, non-distended, BS + x 4. PEG in place. Extremities: No clubbing or cyanosis. Trace edema along lower extremities bilaterally. DP/PT/Radials 2+ and equal bilaterally.  Labs    CBC  Recent Labs  11/01/16 1540 11/01/16 2134  WBC 7.4 6.5  NEUTROABS 6.2 4.9  HGB 9.1* 9.1*  HCT 27.9* 27.9*  MCV 87.7 87.2  PLT 184 0000000   Basic Metabolic Panel  Recent Labs  11/03/16 0416 11/04/16 0439  NA 135 137  K 3.8 3.5  CL 100* 99*  CO2 31 31  GLUCOSE 251* 238*  BUN 27* 26*  CREATININE 1.24* 1.20*  CALCIUM 8.3* 8.2*   Liver Function Tests No results for input(s): AST, ALT, ALKPHOS, BILITOT, PROT, ALBUMIN in the last 72 hours. No results for input(s): LIPASE, AMYLASE in the last 72 hours. Cardiac Enzymes  Recent Labs  11/01/16 2134 11/02/16 0500 11/02/16 0940  TROPONINI 0.44* 0.47* 0.42*   BNP Invalid input(s): POCBNP D-Dimer No results for input(s): DDIMER in the last 72 hours. Hemoglobin A1C No results for input(s): HGBA1C in the last 72 hours. Fasting Lipid Panel No results for input(s): CHOL, HDL, LDLCALC, TRIG, CHOLHDL, LDLDIRECT in the last 72 hours. Thyroid Function Tests No results for input(s): TSH, T4TOTAL, T3FREE, THYROIDAB in the last 72 hours.  Invalid input(s):  FREET3  Telemetry    20 beats of NSVT at 9pm on 12/14 - Personally Reviewed  ECG    NSR with NSSTW changes.  - Personally Reviewed  Radiology    No results found.  Cardiac Studies   Echocardiogram: 10/13/2016 Study Conclusions  - Left ventricle: The cavity size was normal. There was moderate concentric hypertrophy. The appearance was consistent with hypertensive changes or infiltrative cardiomyopathy, such as amyloidosis. Systolic function was moderately to severely reduced. The estimated ejection fraction was in the range of 30% to 35%. Moderate diffuse hypokinesis with no identifiable regional variations. Features are consistent with a pseudonormal left ventricular filling pattern, with concomitant abnormal relaxation and increased filling pressure (grade 2 diastolic dysfunction). - Mitral valve: Valve area by pressure half-time: 2.42 cm^2. - Left atrium: The atrium was moderately dilated. - Pulmonary arteries: Systolic pressure was mildly increased. PA peak pressure: 39 mm Hg (S).  Patient Profile     75 y.o. female with PMH of chronic combined CHF (EF 30-35% by echo in 09/2016) due to infiltrative cardiomyopathy, presumed cardiac amyloidosis (by MRI with prior nondiagnostic fat pad biopsy), minimal CAD (by cath 03/2016), DM, HTN, COPD and CKD stage III who presented to [name] from [place] on 11/01/2016 for worsening dyspnea and a productive cough.   Assessment & Plan    1. Acute on Chronic Combined Systolic and Diastolic CHF/ Nonischemic Cardiomyopathy - EF 30-35% by echo in 09/2016, thought to be due to infiltrative cardiomyopathy (presumed cardiac amyloidosis by MRI).  Continue iv lasix BID still volume overloaded on exam  2. Elevated Troponin - cyclic troponin values have been flat at 0.44, 0.47, and 0.42 (similar to results from previous hospitalization).  - cath in 03/2016 showed minimal nonobstructive CAD.  - likely secondary to  demand ischemia in the setting of her CHF exacerbation. No further ischemic evaluation indicated at this time.   3. HTN - BP at 102/67 - 144/114 in the past 24 hours.  - restarted on Valsartan 37.5mg  daily.   4. Stage 3 CKD -  creatinine is 1.15 stable with diuresis    5. Chronic Normocytic Anemia - Hgb 9.1 on admission, improved from 8.2 at the time of recent hospital discharge. - the patient denies any evidence of active bleeding.   6. Infected cervical hardware with retropharyngeal abscess - s/p irrigation and debridement with removal of hardware on 10/18/2016. PICC in place as she remains on IV antibiotics. - per admitting team  7. Non sustained VT  - 20 beat run at 9pm on 12/14. Asymptomatic  - Will start metoprolol XL 25mg  QD. (BP has been low in the past)   Signed, Jenkins Rouge, MD  11/04/2016, 9:13 AM

## 2016-11-04 NOTE — Clinical Social Work Placement (Signed)
   CLINICAL SOCIAL WORK PLACEMENT  NOTE  Date:  11/04/2016  Patient Details  Name: Mandy Rhodes MRN: [id number] Date of Birth: Jan 26, 1941  Clinical Social Work is seeking post-discharge placement for this patient at the [place] level of care (*CSW will initial, date and re-position this form in  chart as items are completed):  Yes   Patient/family provided with [facility] Work Department's list of facilities offering this level of care within the geographic area requested by the patient (or if unable, by the patient's family).  Yes   Patient/family informed of their freedom to choose among providers that offer the needed level of care, that participate in Medicare, Medicaid or managed care program needed by the patient, have an available bed and are willing to accept the patient.  Yes   Patient/family informed of [facility]'s ownership interest in [facility] and [facility], as well as of the fact that they are under no obligation to receive care at these facilities.  PASRR submitted to EDS on       PASRR number received on       Existing PASRR number confirmed on 11/04/16     FL2 transmitted to all facilities in geographic area requested by pt/family on 11/04/16     FL2 transmitted to all facilities within larger geographic area on       Patient informed that his/her managed care company has contracts with or will negotiate with certain facilities, including the following:            Patient/family informed of bed offers received.  Patient chooses bed at       Physician recommends and patient chooses bed at      Patient to be transferred to   on  .  Patient to be transferred to facility by       Patient family notified on   of transfer.  Name of family member notified:        PHYSICIAN       Additional Comment:    _______________________________________________ Serafina Mitchell, LCSWA 11/04/2016, 4:45 PM

## 2016-11-04 NOTE — NC FL2 (Signed)
[place] LEVEL OF CARE SCREENING TOOL     IDENTIFICATION  Patient Name: Mandy Rhodes Birthdate: [birth year] Sex: female Admission Date (Current Location): 11/01/2016  [facility] [place] [facility] [place] Number:  Herbalist and Address:  [facility] [place], [place] [address] [place], [place] [address]      Provider Number: [id number]  Attending Physician Name and Address:  Debbe Odea, MD  Relative Name and Phone Number:  Kandra Nicolas [phone]    Current Level of Care: Hospital Recommended Level of Care: [place] Prior Approval Number:    Date Approved/Denied:   PASRR Number: [id number]  Discharge Plan: SNF    Current Diagnoses: Patient Active Problem List   Diagnosis Date Noted  . Heart failure ([place])   . Dyspnea 11/01/2016  . Dysphagia 11/01/2016  . Esophageal perforation   . Vertebral osteomyelitis ([place])   . Acute encephalopathy   . Sepsis ([place]) 10/16/2016  . Laryngeal mass 10/16/2016  . Hyperosmolality and/or hypernatremia 10/16/2016  . Anemia 10/16/2016  . Hypokalemia 10/16/2016  . Demand ischemia ([place]) 10/16/2016  . CAP (community acquired pneumonia) 10/16/2016  . PAT (paroxysmal atrial tachycardia) ([place]) 10/14/2016  . Malnutrition of moderate degree 10/14/2016  . Acute respiratory failure with hypoxia ([place]) 10/13/2016  . Pressure injury of skin 10/13/2016  . Mass   . Acute kidney injury superimposed on CKD ([place])   . Acute on chronic combined systolic and diastolic congestive heart failure ([place])   . Edema 08/23/2016  . Shortness of breath 07/14/2016  . Medication management 07/14/2016  . Non-ischemic cardiomyopathy ([place]) 07/14/2016  . Hyperlipidemia 07/14/2016  . Chronic combined systolic and diastolic heart failure ([place]) 04/13/2016  . CKD (chronic kidney disease)   . Acute systolic congestive heart failure, NYHA class 3 ([place]) 03/19/2016  . Cough   . Cardiomegaly 03/17/2016  . Elevated troponin  03/17/2016  . Essential hypertension 03/17/2016  . Diabetes mellitus ([place]) 09/29/2011    Orientation RESPIRATION BLADDER Height & Weight     Self, Time, Situation, Place  Normal Continent Weight: 194 lb (88 kg) Height:  5\' 6"  (167.6 cm)  BEHAVIORAL SYMPTOMS/MOOD NEUROLOGICAL BOWEL NUTRITION STATUS      Continent Diet (NPO)  AMBULATORY STATUS COMMUNICATION OF NEEDS Skin   Limited Assist Verbally Normal                       Personal Care Assistance Level of Assistance  Bathing, Dressing Bathing Assistance: Limited assistance Feeding assistance: Limited assistance Dressing Assistance: Limited assistance     Functional Limitations Info  Sight, Hearing, Speech Sight Info: Adequate Hearing Info: Adequate Speech Info: Adequate    SPECIAL CARE FACTORS FREQUENCY  PT (By licensed PT), OT (By licensed OT)     PT Frequency: 5x wk OT Frequency: 5x wk            Contractures Contractures Info: Not present    Additional Factors Info  Code Status, Allergies Code Status Info: DNR Allergies Info: Sulfa Antibiotics   Insulin Sliding Scale Info: insulin aspart(novolog)       Current Medications (11/04/2016):  This is the current hospital active medication list Current Facility-Administered Medications  Medication Dose Route Frequency Provider Last Rate Last Dose  . 0.9 %  sodium chloride infusion  250 mL Intravenous PRN Karmen Bongo, MD 10 mL/hr at 11/04/16 0848 250 mL at 11/04/16 0848  . acetaminophen (TYLENOL) tablet 650 mg  650 mg Oral Q4H PRN Karmen Bongo, MD  650 mg at 11/02/16 2104  . Ampicillin-Sulbactam (UNASYN) 3 g in sodium chloride 0.9 % 100 mL IVPB  3 g Intravenous Q8H Karmen Bongo, MD   3 g at 11/04/16 1420  . aspirin chewable tablet 81 mg  81 mg Per Tube Daily Karmen Bongo, MD   81 mg at 11/04/16 0945  . enoxaparin (LOVENOX) injection 40 mg  40 mg Subcutaneous Q24H Karmen Bongo, MD   40 mg at 11/03/16 2206  . feeding supplement (OSMOLITE 1.5 CAL)  liquid 1,000 mL  1,000 mL Per Tube Continuous Debbe Odea, MD 50 mL/hr at 11/03/16 2257 1,000 mL at 11/03/16 2257  . feeding supplement (PRO-STAT SUGAR FREE 64) liquid 30 mL  30 mL Per Tube Daily Debbe Odea, MD   30 mL at 11/04/16 0945  . free water 225 mL  225 mL Per Tube Q12H Saima Rizwan, MD   225 mL at 11/04/16 1000  . furosemide (LASIX) injection 40 mg  40 mg Intravenous Q12H Karmen Bongo, MD   40 mg at 11/04/16 [id number]  . glimepiride (AMARYL) tablet 0.5 mg  0.5 mg Oral Q breakfast Debbe Odea, MD   0.5 mg at 11/04/16 0611  . insulin aspart (novoLOG) injection 0-5 Units  0-5 Units Subcutaneous QHS Saima Rizwan, MD      . insulin aspart (novoLOG) injection 0-9 Units  0-9 Units Subcutaneous TID WC Debbe Odea, MD   3 Units at 11/04/16 1232  . irbesartan (AVAPRO) tablet 37.5 mg  37.5 mg Oral Daily Karmen Bongo, MD   37.5 mg at 11/04/16 0948  . MEDLINE mouth rinse  15 mL Mouth Rinse q12n4p Saima Rizwan, MD      . metoprolol succinate (TOPROL-XL) 24 hr tablet 25 mg  25 mg Oral Daily Jerline Pain, MD   25 mg at 11/04/16 0945  . nortriptyline (PAMELOR) capsule 10 mg  10 mg Oral QHS Karmen Bongo, MD   10 mg at 11/03/16 2210  . ondansetron (ZOFRAN) injection 4 mg  4 mg Intravenous Q6H PRN Karmen Bongo, MD      . pantoprazole sodium (PROTONIX) 40 mg/20 mL oral suspension 80 mg  80 mg Per Tube Daily Karmen Bongo, MD   80 mg at 11/04/16 0945  . pravastatin (PRAVACHOL) tablet 40 mg  40 mg Oral QPM Karmen Bongo, MD   40 mg at 11/03/16 1715  . sodium chloride flush (NS) 0.9 % injection 10-40 mL  10-40 mL Intracatheter PRN Joseph Art, MD   10 mL at 11/02/16 0503  . sodium chloride flush (NS) 0.9 % injection 3 mL  3 mL Intravenous Q12H Karmen Bongo, MD      . sodium chloride flush (NS) 0.9 % injection 3 mL  3 mL Intravenous PRN Karmen Bongo, MD         Discharge Medications: Please see discharge summary for a list of discharge medications.  Relevant Imaging Results:  Relevant Lab  Results:   Additional Information SS#[id number]  Milia Warth, [place], LCSWA

## 2016-11-04 NOTE — Progress Notes (Addendum)
This RN and a NT attempted to get patient up, she got up from the bed,took a step and could not take further steps, patient helped back to bed, bed in lowest position, call light within reach will continue to monitor

## 2016-11-05 ENCOUNTER — Inpatient Hospital Stay (HOSPITAL_COMMUNITY): Payer: Medicare Other

## 2016-11-05 DIAGNOSIS — I428 Other cardiomyopathies: Secondary | ICD-10-CM

## 2016-11-05 LAB — GLUCOSE, CAPILLARY
GLUCOSE-CAPILLARY: 192 mg/dL — AB (ref 65–99)
GLUCOSE-CAPILLARY: 228 mg/dL — AB (ref 65–99)
Glucose-Capillary: 200 mg/dL — ABNORMAL HIGH (ref 65–99)
Glucose-Capillary: 171 mg/dL — ABNORMAL HIGH (ref 65–99)

## 2016-11-05 LAB — BASIC METABOLIC PANEL
ANION GAP: 7 (ref 5–15)
BUN: 28 mg/dL — ABNORMAL HIGH (ref 6–20)
CHLORIDE: 97 mmol/L — AB (ref 101–111)
CO2: 35 mmol/L — AB (ref 22–32)
Calcium: 8.5 mg/dL — ABNORMAL LOW (ref 8.9–10.3)
Creatinine, Ser: 1.35 mg/dL — ABNORMAL HIGH (ref 0.44–1.00)
GFR calc non Af Amer: 37 mL/min — ABNORMAL LOW (ref >60)
GFR, EST AFRICAN AMERICAN: 43 mL/min — AB (ref >60)
Glucose, Bld: 217 mg/dL — ABNORMAL HIGH (ref 65–99)
Potassium: 3.6 mmol/L (ref 3.5–5.1)
SODIUM: 139 mmol/L (ref 135–145)

## 2016-11-05 MED ORDER — METOPROLOL SUCCINATE ER 25 MG PO TB24: 25.0000 mg | ORAL_TABLET | Freq: Every day | ORAL | Status: DC

## 2016-11-05 MED ORDER — FUROSEMIDE 10 MG/ML PO SOLN: 40.0000 mg | Freq: Every day | ORAL | 12 refills | Status: DC

## 2016-11-05 MED ORDER — HEPARIN SOD (PORK) LOCK FLUSH 100 UNIT/ML IV SOLN
250.0000 [IU] | INTRAVENOUS | Status: AC | PRN
  Administered 2016-11-05: 250 [IU]

## 2016-11-05 MED ORDER — IRBESARTAN 75 MG PO TABS: 37.5000 mg | ORAL_TABLET | Freq: Every day | ORAL | Status: DC

## 2016-11-05 MED ORDER — POTASSIUM CHLORIDE ER 20 MEQ PO TBCR: 20.0000 meq | EXTENDED_RELEASE_TABLET | Freq: Every day | ORAL | Status: DC

## 2016-11-05 MED ORDER — METOPROLOL TARTRATE 25 MG PO TABS: 25.0000 mg | ORAL_TABLET | Freq: Two times a day (BID) | ORAL | Status: DC

## 2016-11-05 MED ORDER — BISACODYL 10 MG RE SUPP
10.0000 mg | Freq: Once | RECTAL | Status: AC
  Administered 2016-11-05: 10 mg via RECTAL
  Filled 2016-11-05: qty 1

## 2016-11-05 MED ORDER — FREE WATER
100.0000 mL | Status: DC
Start: 2016-11-05 — End: 2017-06-11

## 2016-11-05 NOTE — Progress Notes (Signed)
Patient in a stable condition, transported to [facility] via ambulance, tele dc ccmd notified, PICC line and Gtube in place, patient belongings at bedside

## 2016-11-05 NOTE — Discharge Planning (Signed)
CSW notified, pt ready to DC back to [place]. CSW notified facility, spoke to [place], ok for pt to come back today. CSW updated nurse who requested a 4PM pick up. CSW contacted [facility] and scheduled transport for 4PM. Family has been updated. Transport docs placed on pt's chart. DC Summary/Trnsfr Summary faxed to facility. Pt has no other needs at this time.   NURSE please call [phone] to give report.  CSW is signing off.  Culver Feighner B. Boni Maclellan,MSW, LCSWA Clinical Social Work Dept Weekend Social Worker [phone] 1:12 PM

## 2016-11-05 NOTE — Progress Notes (Signed)
Patient ID: Mandy Rhodes, female   DOB: 1941-06-21, 75 y.o.   MRN: [id number]   Patient Name: Mandy Rhodes Date of Encounter: 11/05/2016  Primary Cardiologist: Dr. [facility] Problem List     Principal Problem:   Acute respiratory failure with hypoxia [facility]) Active Problems:   Diabetes mellitus (HCC)   Elevated troponin   CKD (chronic kidney disease)   Acute on chronic combined systolic and diastolic congestive heart failure (HCC)   Pressure injury of skin   Anemia   Vertebral osteomyelitis (HCC)   Dysphagia   Heart failure (HCC)     Subjective   Overall improved, decreased SOB, no CP did get OOB to chair yesterday Good diuresis   Inpatient Medications    Scheduled Meds: . ampicillin-sulbactam (UNASYN) IV  3 g Intravenous Q8H  . aspirin  81 mg Per Tube Daily  . enoxaparin (LOVENOX) injection  40 mg Subcutaneous Q24H  . feeding supplement (PRO-STAT SUGAR FREE 64)  30 mL Per Tube Daily  . free water  225 mL Per Tube Q12H  . glimepiride  0.5 mg Oral Q breakfast  . insulin aspart  0-5 Units Subcutaneous QHS  . insulin aspart  0-9 Units Subcutaneous TID WC  . irbesartan  37.5 mg Oral Daily  . mouth rinse  15 mL Mouth Rinse q12n4p  . metoprolol succinate  25 mg Oral Daily  . nortriptyline  10 mg Oral QHS  . pantoprazole sodium  80 mg Per Tube Daily  . pravastatin  40 mg Oral QPM  . sodium chloride flush  3 mL Intravenous Q12H   Continuous Infusions: . feeding supplement (OSMOLITE 1.5 CAL) 1,000 mL (11/04/16 2146)   PRN Meds: sodium chloride, acetaminophen, ondansetron (ZOFRAN) IV, sodium chloride flush, sodium chloride flush   Vital Signs    Vitals:   11/04/16 1415 11/04/16 1700 11/04/16 2054 11/05/16 0417  BP: (!) 107/56 (!) 108/59 125/62 (!) 98/46  Pulse: (!) 107 75 77 90  Resp: 19  20 20   Temp: 98.1 F (36.7 C)  98.3 F (36.8 C) 99 F (37.2 C)  TempSrc: Oral  Oral Oral  SpO2:   98% 99%  Weight:    194 lb 11.2 oz (88.3 kg)  Height:         Intake/Output Summary (Last 24 hours) at 11/05/16 0839 Last data filed at 11/05/16 0700  Gross per 24 hour  Intake             1165 ml  Output             3000 ml  Net            -1835 ml   Filed Weights   11/03/16 0500 11/04/16 0500 11/05/16 0417  Weight: 200 lb 6.4 oz (90.9 kg) 194 lb (88 kg) 194 lb 11.2 oz (88.3 kg)    Physical Exam    General: Pleasant, African American female appearing in NAD. Psych: Normal affect. Neuro: Alert and oriented X 3. Moves all extremities spontaneously. HEENT: Normal           Neck: Supple without bruits. JVD at 9 cm. Lungs:  Resp regular and unlabored, rhonchi along upper lung fields, mild rales at bases bilaterally. Heart: RRR no s3, s4, or murmurs. Abdomen: Soft, non-tender, non-distended, BS + x 4. PEG in place. Extremities: No clubbing or cyanosis. Trace edema along lower extremities bilaterally. DP/PT/Radials 2+ and equal bilaterally.  Labs    CBC No results for input(s): WBC, NEUTROABS,  HGB, HCT, MCV, PLT in the last 72 hours. Basic Metabolic Panel  Recent Labs  11/04/16 0439 11/05/16 0429  NA 137 139  K 3.5 3.6  CL 99* 97*  CO2 31 35*  GLUCOSE 238* 217*  BUN 26* 28*  CREATININE 1.20* 1.35*  CALCIUM 8.2* 8.5*   Liver Function Tests No results for input(s): AST, ALT, ALKPHOS, BILITOT, PROT, ALBUMIN in the last 72 hours. No results for input(s): LIPASE, AMYLASE in the last 72 hours. Cardiac Enzymes  Recent Labs  11/02/16 0940  TROPONINI 0.42*   BNP Invalid input(s): POCBNP D-Dimer No results for input(s): DDIMER in the last 72 hours. Hemoglobin A1C No results for input(s): HGBA1C in the last 72 hours. Fasting Lipid Panel No results for input(s): CHOL, HDL, LDLCALC, TRIG, CHOLHDL, LDLDIRECT in the last 72 hours. Thyroid Function Tests No results for input(s): TSH, T4TOTAL, T3FREE, THYROIDAB in the last 72 hours.  Invalid input(s): FREET3  Telemetry    20 beats of NSVT at 9pm on 12/14 - Personally  Reviewed  ECG    NSR with NSSTW changes.  - Personally Reviewed  Radiology    No results found.  Cardiac Studies   Echocardiogram: 10/13/2016 Study Conclusions  - Left ventricle: The cavity size was normal. There was moderate concentric hypertrophy. The appearance was consistent with hypertensive changes or infiltrative cardiomyopathy, such as amyloidosis. Systolic function was moderately to severely reduced. The estimated ejection fraction was in the range of 30% to 35%. Moderate diffuse hypokinesis with no identifiable regional variations. Features are consistent with a pseudonormal left ventricular filling pattern, with concomitant abnormal relaxation and increased filling pressure (grade 2 diastolic dysfunction). - Mitral valve: Valve area by pressure half-time: 2.42 cm^2. - Left atrium: The atrium was moderately dilated. - Pulmonary arteries: Systolic pressure was mildly increased. PA peak pressure: 39 mm Hg (S).  Patient Profile     75 y.o. female with PMH of chronic combined CHF (EF 30-35% by echo in 09/2016) due to infiltrative cardiomyopathy, presumed cardiac amyloidosis (by MRI with prior nondiagnostic fat pad biopsy), minimal CAD (by cath 03/2016), DM, HTN, COPD and CKD stage III who presented to [name] from [place] on 11/01/2016 for worsening dyspnea and a productive cough.   Assessment & Plan    1. Acute on Chronic Combined Systolic and Diastolic CHF/ Nonischemic Cardiomyopathy - EF 30-35% by echo in 09/2016, thought to be due to infiltrative cardiomyopathy (presumed cardiac amyloidosis by MRI).  Continue iv lasix BID still volume overloaded on exam  Good response  2. Elevated Troponin - cyclic troponin values have been flat at 0.44, 0.47, and 0.42 (similar to results from previous hospitalization).  - cath in 03/2016 showed minimal nonobstructive CAD.  - likely secondary to demand ischemia in the setting of her CHF exacerbation. No  further ischemic evaluation indicated at this time.   3. HTN - BP at 102/67 - 144/114 in the past 24 hours.  - restarted on Valsartan 37.5mg  daily.   4. Stage 3 CKD -  creatinine is 1.15 stable with diuresis    5. Chronic Normocytic Anemia - Hgb 9.1 on admission, improved from 8.2 at the time of recent hospital discharge. - the patient denies any evidence of active bleeding.   6. Infected cervical hardware with retropharyngeal abscess - s/p irrigation and debridement with removal of hardware on 10/18/2016. PICC in place as she remains on IV antibiotics. - per admitting team  7. Non sustained VT  - just ambient PVC;s on beta  blocker at this time   May need inpatient rehab very deconditioned    Signed, Jenkins Rouge, MD  11/05/2016, 8:39 AM  Patient ID: Mandy Rhodes, female   DOB: 04-24-1941, 75 y.o.   MRN: [id number]

## 2016-11-05 NOTE — Discharge Summary (Signed)
Physician Discharge Summary  [facility] EQA:[id number] DOB: 1941-02-22 DOA: 11/01/2016  PCP: Vidal Schwalbe, MD  Admit date: 11/01/2016 Discharge date: 11/05/2016  Admitted From: SNF  Disposition:  SNF   Recommendations for Outpatient Follow-up:  Daily weight to ensure she is not getting fluid overloaded or dehydrated Bmet, Mg in 5-7 days she is on Lasix,  K, ARB Will d/c back to SNF  Discharge Condition:  stable   CODE STATUS:  CNR   Diet recommendation:  Tube feeds as ordered Consultations:  cardiolgoy    Discharge Diagnoses:  Principal Problem:   Acute respiratory failure with hypoxia ([facility]) Active Problems:   Diabetes mellitus ([facility])   Elevated troponin   CKD (chronic kidney disease)   Acute on chronic combined systolic and diastolic congestive heart failure (HCC)   Pressure injury of skin   Anemia   Vertebral osteomyelitis ([facility])   Dysphagia   Heart failure ([facility])    Subjective: She has no complaint of dyspnea or chest pain. No other complaints.   Brief Summary: 75 y/o female with DM, NICM and chronic combined CHF from suspected cardiac amyloidosis, CKD3, HTN with recent prolongedhospitalization for infected neck hardware with subsequent NSTEMI/SVT and dysphagia discharged to [facility]. Lasix and Captopril were held on discharge.  She presents with dyspnea and hypoxia with pulse ox of 82% on RA, chest pressure and cough with foamy sputum. She was admitted for a CHF exacerbation.   Hospital Course:  Principal Problem:   Acute respiratory failure with hypoxia/  Acute on chronic combined systolic and diastolic congestive heart failure - persumed cardiac amyloidosis(by MRI with prior nondiagnostic fat pad biopsy) - EF 30-35 % and G2 d CHFon ECHO on 11/17  - likely CHF- diuresed with IV Lasix  - decreased free water per tube while here  --new start on Metoprolol and Ibesartan and Lasix - following daily weights- 95.4 kg >>>88.3 kg - I & O-  negative almost 13 L -CO2 rising and now 35 signifying contraction alkalosis- Cr rising- see below labs- CXR today clear of infiltrates- pulse ox 97% on room air-  will d/c IV Lasix today and transition to oral   -follow I and O at facility:  per nutritionist calculations, tube feeds are providing 914 ml of Free water daily - will only give 100 cc Q8 for an additional 300 cc   Active Problems:  Infected cervical hardware with retropharyngeal abscess and vertebral osteomyelitis -status post exploration and irrigation debridement of prevertebral abscess and removal of hardware 10/18/2016 - Unasyn through 12/19 Q 8 hrs per ID- she has a PICC - I called and spoke with NS to determine f/u - it is scheduled for 1/3    Dysphagia  - cont feeds through PEG    Diabetes mellitus   - A1c 7/1- on 11/17 - Amaryl and ISS utilized    Elevated troponin - flat trend- likely demand ischemia    CKD (chronic kidney disease) 3 - relatively stable- followed closely with diuresis    Pressure injury of skin - cont wound care    Anemia of Chronic disease - stable      Discharge Instructions  Discharge Instructions    (HEART FAILURE PATIENTS) Call MD:  Anytime you have any of the following symptoms: 1) 3 pound weight gain in 24 hours or 5 pounds in 1 week 2) shortness of breath, with or without a dry hacking cough 3) swelling in the hands, feet or stomach 4) if you have  to sleep on extra pillows at night in order to breathe.    Complete by:  As directed    Increase activity slowly    Complete by:  As directed      Allergies as of 11/05/2016      Reactions   Sulfa Antibiotics Swelling      Medication List    TAKE these medications   ampicillin-sulbactam IVPB Commonly known as:  UNASYN Inject 3 g into the vein every 8 (eight) hours. Indication:  Retropharyngeal abscess Last Day of Therapy:  11/07/16 Labs - Once weekly:  CBC/D and BMP, Labs - Every other week:  ESR and CRP    aspirin 81 MG EC tablet Take 1 tablet (81 mg total) by mouth daily.   CALCIUM 600 + D PO Take 1 tablet by mouth every morning.   cholecalciferol 1000 units tablet Commonly known as:  VITAMIN D Take 1,000 Units by mouth daily. Reported on 03/27/2016   feeding supplement (PRO-STAT SUGAR FREE 64) Liqd Take 30 mLs by mouth daily.   ferrous sulfate 325 (65 FE) MG EC tablet Take 325 mg by mouth daily.   free water Soln Place 100 mLs into feeding tube See admin instructions. 100 cc Q 8 hrs only What changed:  how much to take  additional instructions   glimepiride 1 MG tablet Commonly known as:  AMARYL Take 0.5 tablets (0.5 mg total) by mouth daily with breakfast.   irbesartan 75 MG tablet Commonly known as:  AVAPRO Take 0.5 tablets (37.5 mg total) by mouth daily. Start taking on:  11/06/2016   metoprolol tartrate 25 MG tablet Commonly known as:  LOPRESSOR Place 1 tablet (25 mg total) into feeding tube 2 (two) times daily.   multivitamin tablet Take 1 tablet by mouth daily.   nortriptyline 10 MG capsule Commonly known as:  PAMELOR Take 10 mg by mouth at bedtime.   NUTREN 2.0 Liqd Take 40 mL/hr by mouth continuous.   omeprazole 40 MG capsule Commonly known as:  PRILOSEC Take 40 mg by mouth daily.   OXYGEN Inhale 2 L into the lungs daily as needed (for < 90% sats).   pravastatin 40 MG tablet Commonly known as:  PRAVACHOL Take 40 mg by mouth every evening.       Allergies  Allergen Reactions  . Sulfa Antibiotics Swelling     Procedures/Studies:  Dg Abd 1 View  Result Date: 10/13/2016 CLINICAL DATA:  Enteric tube placement. EXAM: ABDOMEN - 1 VIEW COMPARISON:  None. FINDINGS: Normal bowel gas pattern. Enteric tube tip projects over the mid stomach. Multilevel degenerative changes of the lumbar spine. Moderate bilateral hip osteoarthrosis. IMPRESSION: Enteric tube tip projects over mid stomach. Electronically Signed   By: Kristine Garbe M.D.   On:  10/13/2016 05:30   Ct Head Wo Contrast  Result Date: 10/13/2016 CLINICAL DATA:  Unresponsive patient. Patient complained of breath and generalized weakness for 2 weeks. EXAM: CT HEAD WITHOUT CONTRAST TECHNIQUE: Contiguous axial images were obtained from the base of the skull through the vertex without intravenous contrast. COMPARISON:  None. FINDINGS: Brain: No evidence of acute infarction, hemorrhage, hydrocephalus, extra-axial collection or mass lesion/mass effect. There is moderate brain parenchymal volume loss and symmetric bilateral microangiopathy. Vascular: Atherosclerotic disease at the skullbase. Skull: Normal. Negative for fracture or focal lesion. Sinuses/Orbits: No acute finding. Other: None. IMPRESSION: No acute intracranial abnormality. Moderate brain parenchymal atrophy and chronic microvascular disease. Electronically Signed   By: Fidela Salisbury M.D.   On: 10/13/2016  12:35   Ct Soft Tissue Neck Wo Contrast  Addendum Date: 10/13/2016   ADDENDUM REPORT: 10/13/2016 04:53 ADDENDUM: These results were called by telephone at the time of interpretation on 10/13/2016 at 4:53 am to Dr. Jimmy Footman, who verbally acknowledged these results. Electronically Signed   By: Kristine Garbe M.D.   On: 10/13/2016 04:53   Result Date: 10/13/2016 CLINICAL DATA:  75 y/o F; shortness of breath and generalized weakness for 2 weeks. Evaluation of supraglottic mass seen on intubation. EXAM: CT NECK AND CHEST WITHOUT CONTRAST COMPARISON:  07/15/2015 CT of cervical spine. FINDINGS: CT NECK FINDINGS Pharynx and larynx: There is fluid and debris throughout the oropharynx, hypopharynx, and supraglottic airway due to intubation and nasoenteric tube. Addition, the region is obscured by artifact from cervical fusion hardware and the endotracheal tube. No soft tissue mass can be distinguished. Laryngeal cartilage appears intact. Subglottic airway is patent. Salivary glands: No inflammation, mass, or stone.  Thyroid: Normal. Lymph nodes: None enlarged or abnormal density. Vascular: Calcific atherosclerosis of bilateral carotid bifurcations. Limited intracranial: Negative. Visualized orbits: Not within the field of view Mastoids and visualized paranasal sinuses: Normally pneumatized mastoid air cells and visualized sinuses. Skeleton: Anterior cervical fusion with plate and screws from C3 through C6. Multilevel uncovertebral and facet hypertrophy with severe bony foraminal narrowing through the levels of fusion 1. C6-7 disc osteophyte complex with moderate to severe canal stenosis. C2-3 disc osteophyte complex with at least moderate canal stenosis. No acute osseous abnormality is identified. Flowing anterior ossification of upper thoracic spine may represent DISH. CT CHEST FINDINGS Cardiovascular: Mild cardiomegaly. No pericardial effusion. Moderate coronary artery calcification. Normal caliber thoracic aorta. Borderline enlarged main pulmonary artery. Mediastinum/Nodes: No enlarged mediastinal or axillary lymph nodes. Thyroid gland, trachea, and esophagus demonstrate no significant findings. Endotracheal tube is less than 1 cm from the carina. Lungs/Pleura: There is patchy nodular consolidation within the bilateral upper and right lower lobe and a dense consolidation within the left lower lobe with air bronchograms. Small left pleural effusion. Upper Abdomen: Enteric tube tip below the field of view in the stomach. No acute process. Musculoskeletal: Flowing anterior ossification of vertebral bodies consistent with DISH. No acute osseous abnormality. IMPRESSION: 1. Fluid and debris throughout the oropharynx, hypopharynx, and supraglottic airway from intubation. Suboptimal evaluation of the mucosa due to airway opacification and lack of contrast. Follow-up contrast examination after extubation and/or direct visualization recommended for evaluation of the supraglottic mass. 2. No cervical lymphadenopathy identified. 3. Left  lower lobe consolidation and nodular opacities throughout the lungs compatible with pneumonia. Small left pleural effusion. 4. Endotracheal tube 1 cm from carina, retraction recommended. 5. Enteric tube tip below the field of view in the stomach. 6. Moderate coronary artery calcifications. 7. Borderline main pulmonary artery may represent pulmonary artery hypertension. Electronically Signed: By: Kristine Garbe M.D. On: 10/13/2016 04:36   Ct Chest Wo Contrast  Addendum Date: 10/13/2016   ADDENDUM REPORT: 10/13/2016 04:53 ADDENDUM: These results were called by telephone at the time of interpretation on 10/13/2016 at 4:53 am to Dr. Jimmy Footman, who verbally acknowledged these results. Electronically Signed   By: Kristine Garbe M.D.   On: 10/13/2016 04:53   Result Date: 10/13/2016 CLINICAL DATA:  75 y/o F; shortness of breath and generalized weakness for 2 weeks. Evaluation of supraglottic mass seen on intubation. EXAM: CT NECK AND CHEST WITHOUT CONTRAST COMPARISON:  07/15/2015 CT of cervical spine. FINDINGS: CT NECK FINDINGS Pharynx and larynx: There is fluid and debris throughout the oropharynx, hypopharynx, and  supraglottic airway due to intubation and nasoenteric tube. Addition, the region is obscured by artifact from cervical fusion hardware and the endotracheal tube. No soft tissue mass can be distinguished. Laryngeal cartilage appears intact. Subglottic airway is patent. Salivary glands: No inflammation, mass, or stone. Thyroid: Normal. Lymph nodes: None enlarged or abnormal density. Vascular: Calcific atherosclerosis of bilateral carotid bifurcations. Limited intracranial: Negative. Visualized orbits: Not within the field of view Mastoids and visualized paranasal sinuses: Normally pneumatized mastoid air cells and visualized sinuses. Skeleton: Anterior cervical fusion with plate and screws from C3 through C6. Multilevel uncovertebral and facet hypertrophy with severe bony foraminal  narrowing through the levels of fusion 1. C6-7 disc osteophyte complex with moderate to severe canal stenosis. C2-3 disc osteophyte complex with at least moderate canal stenosis. No acute osseous abnormality is identified. Flowing anterior ossification of upper thoracic spine may represent DISH. CT CHEST FINDINGS Cardiovascular: Mild cardiomegaly. No pericardial effusion. Moderate coronary artery calcification. Normal caliber thoracic aorta. Borderline enlarged main pulmonary artery. Mediastinum/Nodes: No enlarged mediastinal or axillary lymph nodes. Thyroid gland, trachea, and esophagus demonstrate no significant findings. Endotracheal tube is less than 1 cm from the carina. Lungs/Pleura: There is patchy nodular consolidation within the bilateral upper and right lower lobe and a dense consolidation within the left lower lobe with air bronchograms. Small left pleural effusion. Upper Abdomen: Enteric tube tip below the field of view in the stomach. No acute process. Musculoskeletal: Flowing anterior ossification of vertebral bodies consistent with DISH. No acute osseous abnormality. IMPRESSION: 1. Fluid and debris throughout the oropharynx, hypopharynx, and supraglottic airway from intubation. Suboptimal evaluation of the mucosa due to airway opacification and lack of contrast. Follow-up contrast examination after extubation and/or direct visualization recommended for evaluation of the supraglottic mass. 2. No cervical lymphadenopathy identified. 3. Left lower lobe consolidation and nodular opacities throughout the lungs compatible with pneumonia. Small left pleural effusion. 4. Endotracheal tube 1 cm from carina, retraction recommended. 5. Enteric tube tip below the field of view in the stomach. 6. Moderate coronary artery calcifications. 7. Borderline main pulmonary artery may represent pulmonary artery hypertension. Electronically Signed: By: Kristine Garbe M.D. On: 10/13/2016 04:36   Ir Gastrostomy  Tube Mod Sed  Result Date: 10/25/2016 INDICATION: 75 year old female with a history of tracheal perforation and retropharyngeal abscess. Gastrostomy tube placement is required as the patient's remain NPO for an extended period of time. EXAM: Fluoroscopically guided placement of percutaneous pull-through gastrostomy tube Interventional Radiologist:  Criselda Peaches, MD MEDICATIONS: Currently receiving intravenous antibiotics during this hospital admission (Unasyn). Most recent dose administered within 1 hour of skin incision. 1 mg glucagon also administered intravenously. ANESTHESIA/SEDATION: Versed 3 mg IV; Fentanyl 125 mcg IV Moderate Sedation Time:  45 minutes The patient was continuously monitored during the procedure by the interventional radiology nurse under my direct supervision. CONTRAST:  10 mL Isovue-300 FLUOROSCOPY TIME:  Fluoroscopy Time: 2 minutes 12 seconds (37 mGy). COMPLICATIONS: None immediate. PROCEDURE: Informed written consent was obtained from the patient after a thorough discussion of the procedural risks, benefits and alternatives. All questions were addressed. Maximal Sterile Barrier Technique was utilized including caps, mask, sterile gowns, sterile gloves, sterile drape, hand hygiene and skin antiseptic. A timeout was performed prior to the initiation of the procedure. Maximal barrier sterile technique utilized including caps, mask, sterile gowns, sterile gloves, large sterile drape, hand hygiene, and chlorhexadine skin prep. An angled catheter was advanced over a wire under fluoroscopic guidance through the nose, down the esophagus and into the body  of the stomach. The stomach was then insufflated with several 100 ml of air. Fluoroscopy confirmed location of the gastric bubble, as well as inferior displacement of the barium stained colon. Under direct fluoroscopic guidance, a single T-tack was placed, and the anterior gastric wall drawn up against the anterior abdominal wall.  Percutaneous access was then obtained into the mid gastric body with an 18 gauge sheath needle. Aspiration of air, and injection of contrast material under fluoroscopy confirmed needle placement. An Amplatz wire was advanced in the gastric body and the access needle exchanged for a 9-French vascular sheath. A snare device was advanced through the vascular sheath and an Amplatz wire advanced through the angled catheter. The Amplatz wire was successfully snared and this was pulled up through the esophagus and out the mouth. A 20-French Alinda Dooms MIC-PEG tube was then connected to the snare and pulled through the mouth, down the esophagus, into the stomach and out to the anterior abdominal wall. Hand injection of contrast material confirmed intragastric location. The T-tack retention suture was then cut. The pull through peg tube was then secured with the external bumper and capped. The patient will be observed for several hours with the newly placed tube on low wall suction to evaluate for any post procedure complication. The patient tolerated the procedure well, there is no immediate complication. IMPRESSION: Successful placement of a 20 French pull through gastrostomy tube. Electronically Signed   By: [name] M.D.   On: 10/25/2016 15:43   Dg Chest Port 1 View  Result Date: 11/05/2016 CLINICAL DATA:  Hypoxia EXAM: PORTABLE CHEST 1 VIEW COMPARISON:  11/01/2016 FINDINGS: Cardiomegaly again noted. No acute infiltrate or pulmonary edema. Stable right arm PICC line position. No pneumothorax. Improvement in aeration without pulmonary edema. Mild basilar atelectasis. No segmental infiltrate. Degenerative changes bilateral shoulders. IMPRESSION: No acute infiltrate or pulmonary edema. Stable right arm PICC line position. No pneumothorax. Improvement in aeration without pulmonary edema. Mild basilar atelectasis. No segmental infiltrate. Electronically Signed   By: Lahoma Crocker M.D.   On: 11/05/2016 10:23    Dg Chest Portable 1 View  Result Date: 11/01/2016 CLINICAL DATA:  Shortness of Breath EXAM: PORTABLE CHEST 1 VIEW COMPARISON:  10/26/2016 FINDINGS: Right-sided PICC line is again identified and stable. Cardiomegaly is again seen. The lungs are well aerated with mild bibasilar atelectatic changes and small left pleural effusion. The overall appearance is stable from the prior exam. The degree of vascular congestion has improved slightly in the interval from the prior study. IMPRESSION: Bibasilar changes stable from the recent exam. Stable right PICC line. Electronically Signed   By: Inez Catalina M.D.   On: 11/01/2016 15:37   Dg Chest Port 1 View  Result Date: 10/26/2016 CLINICAL DATA:  75 year old female line placement. Initial encounter. EXAM: PORTABLE CHEST 1 VIEW COMPARISON:  10/21/2016 and earlier. FINDINGS: Portable AP semi upright view at 1237 hours. Right upper extremity approach PICC line catheter now in place. Tip projects at the cavoatrial junction level. Stable cardiomegaly and mediastinal contours. Dense retrocardiac opacification and obscuration of the left hemidiaphragm. Mildly decreased pulmonary vascularity since 10/21/2016. No pneumothorax. Possible small left pleural effusion. IMPRESSION: 1. Right PICC line in place, tip at the cavoatrial junction level. 2. Left greater than right lower lobe collapse or consolidation. Probable small left pleural effusion. 3. Mildly decreased pulmonary vascular congestion. Electronically Signed   By: Genevie Ann M.D.   On: 10/26/2016 12:54   Dg Chest Port 1 View  Result Date: 10/21/2016  CLINICAL DATA:  Respiratory failure with fever. EXAM: PORTABLE CHEST 1 VIEW COMPARISON:  October 19, 2016 FINDINGS: Cardiomegaly and pulmonary venous congestion. Poor evaluation of the right upper lobe. A nodular density in this region is felt the likely be artifactual was not present previously. No other interval changes or acute abnormalities. IMPRESSION: Limited study  with cardiomegaly and pulmonary venous congestion. Probable artifactual nodular density in the right upper lobe. Recommend PA and lateral chest x-ray before discharge. Electronically Signed   By: Dorise Bullion III M.D   On: 10/21/2016 11:19   Dg Chest Port 1 View  Result Date: 10/19/2016 CLINICAL DATA:  Check endotracheal tube placement EXAM: PORTABLE CHEST 1 VIEW COMPARISON:  10/16/2016 FINDINGS: Endotracheal tube and nasogastric catheter are noted in satisfactory position. Cardiac shadow remains enlarged. The lungs are clear bilaterally. No bony abnormality is seen. IMPRESSION: No acute abnormality noted. Electronically Signed   By: Inez Catalina M.D.   On: 10/19/2016 07:34   Dg Chest Port 1 View  Result Date: 10/16/2016 CLINICAL DATA:  Hypoxia EXAM: PORTABLE CHEST 1 VIEW COMPARISON:  October 16, 2016 FINDINGS: Endotracheal tube tip is 3.3 cm above the carina. No pneumothorax. No edema or consolidation. Heart is prominent with pulmonary vascularity within normal limits, stable. No adenopathy. There is extensive arthropathy in each shoulder. IMPRESSION: Endotracheal tube as described without pneumothorax. Stable cardiac prominence. No edema or consolidation. Electronically Signed   By: Lowella Grip III M.D.   On: 10/16/2016 15:12   Dg Chest Port 1 View  Result Date: 10/16/2016 CLINICAL DATA:  Pneumonia EXAM: PORTABLE CHEST 1 VIEW COMPARISON:  10/13/2016 FINDINGS: Endotracheal tube remains in place, unchanged. Interval removal of NG tube. There is cardiomegaly. Improving aeration at the left base with mild residual atelectasis or infiltrate. No confluent opacity on the right. No effusions. IMPRESSION: Cardiomegaly. Improving left base aeration with mild residual atelectasis or infiltrate. Electronically Signed   By: Rolm Baptise M.D.   On: 10/16/2016 12:07   Dg Chest Port 1 View  Result Date: 10/13/2016 CLINICAL DATA:  75 y/o  F; endotracheal tube placement. EXAM: PORTABLE CHEST 1 VIEW  COMPARISON:  10/13/2016 chest CT. FINDINGS: Endotracheal tube is 2 cm from the carina. Enteric tube tip below the field of view in the abdomen. Left lower lobe consolidation. No pneumothorax. Stable cardiac silhouette. No acute osseous abnormality is evident. IMPRESSION: Endotracheal tube approximately 2 cm from carina. Left lower lobe consolidation. Electronically Signed   By: Kristine Garbe M.D.   On: 10/13/2016 05:29   Dg Chest Portable 1 View  Result Date: 10/13/2016 CLINICAL DATA:  Post intubation. EXAM: PORTABLE CHEST 1 VIEW COMPARISON:  10/12/2016 FINDINGS: Endotracheal tube is been placed with tip measuring 3.9 cm above the carina. An enteric tube was placed. Tip is off the field of view but below the left hemidiaphragm. Shallow inspiration. Borderline heart size without vascular congestion or edema. No focal consolidation in the lungs. Degenerative changes in the shoulders. Tortuous aorta. IMPRESSION: Appliances appear in satisfactory position. Mild cardiac enlargement. No focal airspace disease or consolidation in the lungs. Electronically Signed   By: Lucienne Capers M.D.   On: 10/13/2016 01:23   Dg Chest Port 1 View  Result Date: 10/12/2016 CLINICAL DATA:  Weakness for 2 weeks. Shortness of breath and wheezing tonight. EXAM: PORTABLE CHEST 1 VIEW COMPARISON:  07/11/2016 FINDINGS: Cardiac enlargement without vascular congestion or edema. No focal consolidation in the lungs. No blunting of costophrenic angles. No pneumothorax. Degenerative changes in the spine and  shoulders. IMPRESSION: Cardiac enlargement.  No evidence of active pulmonary disease. Electronically Signed   By: Lucienne Capers M.D.   On: 10/12/2016 23:55   Dg Abd Portable 1v  Result Date: 10/19/2016 CLINICAL DATA:  Evaluate orogastric tube position. EXAM: PORTABLE ABDOMEN - 1 VIEW COMPARISON:  Abdominal radiograph of October 13, 2016 FINDINGS: The proximal port of the orogastric tube lies just above the GE  junction. The tip lies at the junction of the cardia and proximal body of the stomach. IMPRESSION: Advancement of the orogastric tube by a 10 cm is recommended to assure that the proximal port lies below the GE junction. Electronically Signed   By: David  Martinique M.D.   On: 10/19/2016 10:11       Discharge Exam: Vitals:   11/05/16 0417 11/05/16 0853  BP: (!) 98/46 (!) 104/42  Pulse: 90 88  Resp: 20 18  Temp: 99 F (37.2 C) 98.8 F (37.1 C)   Vitals:   11/04/16 2054 11/05/16 0417 11/05/16 0853 11/05/16 1048  BP: 125/62 (!) 98/46 (!) 104/42   Pulse: 77 90 88   Resp: _0 Temp: 98.3 F (36.8 C) 99 F (37.2 C) 98.8 F (37.1 C)   TempSrc: Oral Oral Oral   SpO2: 98% 99% 99% 95%  Weight:  88.3 kg (194 lb 11.2 oz)    Height:        General: Pt is alert, awake, not in acute distress Cardiovascular: RRR, S1/S2 +, no rubs, no gallops Respiratory: CTA bilaterally, no wheezing, no rhonchi Abdominal: Soft, NT, ND, bowel sounds + Extremities: no edema, no cyanosis    The results of significant diagnostics from this hospitalization (including imaging, microbiology, ancillary and laboratory) are listed below for reference.     Microbiology: No results found for this or any previous visit (from the past 240 hour(s)).   Labs: BNP (last 3 results)  Recent Labs  07/27/16 1029 10/12/16 2302 11/01/16 1540  BNP 516.1* 313.4* 7,915.0*   Basic Metabolic Panel:  Recent Labs Lab 11/01/16 1540 11/02/16 0500 11/03/16 0416 11/04/16 0439 11/05/16 0429  NA 136 137 135 137 139  K 4.3 3.8 3.8 3.5 3.6  CL 104 100* 100* 99* 97*  CO2 27 33* 31 31 35*  GLUCOSE 279* 194* 251* 238* 217*  BUN 21* 20 27* 26* 28*  CREATININE 1.12* 1.15* 1.24* 1.20* 1.35*  CALCIUM 9.1 8.8* 8.3* 8.2* 8.5*   Liver Function Tests: No results for input(s): AST, ALT, ALKPHOS, BILITOT, PROT, ALBUMIN in the last 168 hours. No results for input(s): LIPASE, AMYLASE in the last 168 hours. No results for  input(s): AMMONIA in the last 168 hours. CBC:  Recent Labs Lab 11/01/16 1540 11/01/16 2134  WBC 7.4 6.5  NEUTROABS 6.2 4.9  HGB 9.1* 9.1*  HCT 27.9* 27.9*  MCV 87.7 87.2  PLT 184 181   Cardiac Enzymes:  Recent Labs Lab 11/01/16 1540 11/01/16 2134 11/02/16 0500 11/02/16 0940  TROPONINI 0.41* 0.44* 0.47* 0.42*   BNP: Invalid input(s): POCBNP CBG:  Recent Labs Lab 11/04/16 1205 11/04/16 1621 11/04/16 2143 11/05/16 0416 11/05/16 0826  GLUCAP 221* 189* 158* 192* 228*   D-Dimer No results for input(s): DDIMER in the last 72 hours. Hgb A1c No results for input(s): HGBA1C in the last 72 hours. Lipid Profile No results for input(s): CHOL, HDL, LDLCALC, TRIG, CHOLHDL, LDLDIRECT in the last 72 hours. Thyroid function studies No results for input(s): TSH, T4TOTAL, T3FREE, THYROIDAB in the last 72 hours.  Invalid input(s): FREET3 Anemia work up No results for input(s): VITAMINB12, FOLATE, FERRITIN, TIBC, IRON, RETICCTPCT in the last 72 hours. Urinalysis    Component Value Date/Time   COLORURINE AMBER (A) 10/13/2016 0503   APPEARANCEUR CLOUDY (A) 10/13/2016 0503   LABSPEC 1.023 10/13/2016 0503   LABSPEC 1.020 12/22/2010 1142   PHURINE 5.0 10/13/2016 0503   GLUCOSEU NEGATIVE 10/13/2016 0503   HGBUR NEGATIVE 10/13/2016 0503   BILIRUBINUR MODERATE (A) 10/13/2016 0503   BILIRUBINUR Negative 12/22/2010 1142   KETONESUR NEGATIVE 10/13/2016 0503   PROTEINUR NEGATIVE 10/13/2016 0503   UROBILINOGEN 0.2 07/15/2015 1349   NITRITE NEGATIVE 10/13/2016 0503   LEUKOCYTESUR NEGATIVE 10/13/2016 0503   LEUKOCYTESUR Negative 12/22/2010 1142   Sepsis Labs Invalid input(s): PROCALCITONIN,  WBC,  LACTICIDVEN Microbiology No results found for this or any previous visit (from the past 240 hour(s)).   Time coordinating discharge: Over 30 minutes  SIGNED:   Debbe Odea, MD  [facility] 11/05/2016, 11:11 AM Pager   If 7PM-7AM, please contact  night-coverage [email or web address] Password [email or web address]

## 2016-11-05 NOTE — Progress Notes (Signed)
Report called to nurse [place] at [facility]

## 2016-11-05 NOTE — Clinical Social Work Placement (Signed)
   CLINICAL SOCIAL WORK PLACEMENT  NOTE  Date:  11/05/2016  Patient Details  Name: Mandy Rhodes MRN: [id number] Date of Birth: 09/08/1941  Clinical Social Work is seeking post-discharge placement for this patient at the [place] level of care (*CSW will initial, date and re-position this form in  chart as items are completed):  Yes   Patient/family provided with [place] Work Department's list of facilities offering this level of care within the geographic area requested by the patient (or if unable, by the patient's family).  Yes   Patient/family informed of their freedom to choose among providers that offer the needed level of care, that participate in Medicare, Medicaid or managed care program needed by the patient, have an available bed and are willing to accept the patient.  Yes   Patient/family informed of [facility]'s ownership interest in [facility] and [facility], as well as of the fact that they are under no obligation to receive care at these facilities.  PASRR submitted to EDS on       PASRR number received on       Existing PASRR number confirmed on 11/04/16     FL2 transmitted to all facilities in geographic area requested by pt/family on 11/04/16     FL2 transmitted to all facilities within larger geographic area on       Patient informed that his/her managed care company has contracts with or will negotiate with certain facilities, including the following:        Yes   Patient/family informed of bed offers received.  Patient chooses bed at       Physician recommends and patient chooses bed at      Patient to be transferred to   on 11/05/16.  Patient to be transferred to facility by       Patient family notified on 11/05/16 of transfer.  Name of family member notified:  [name]     PHYSICIAN       Additional Comment:    _______________________________________________ Serafina Mitchell, LCSWA 11/05/2016,  1:05 PM

## 2016-11-05 NOTE — Progress Notes (Signed)
Verbal order received from [place] MD to dc foley.

## 2016-11-07 ENCOUNTER — Non-Acute Institutional Stay (SKILLED_NURSING_FACILITY): Payer: Medicare Other | Admitting: Internal Medicine

## 2016-11-07 ENCOUNTER — Encounter: Payer: Self-pay | Admitting: Internal Medicine

## 2016-11-07 ENCOUNTER — Encounter: Payer: Self-pay | Admitting: *Deleted

## 2016-11-07 DIAGNOSIS — N183 Chronic kidney disease, stage 3 unspecified: Secondary | ICD-10-CM

## 2016-11-07 DIAGNOSIS — I5043 Acute on chronic combined systolic (congestive) and diastolic (congestive) heart failure: Secondary | ICD-10-CM

## 2016-11-07 DIAGNOSIS — M4620 Osteomyelitis of vertebra, site unspecified: Secondary | ICD-10-CM

## 2016-11-07 DIAGNOSIS — N179 Acute kidney failure, unspecified: Secondary | ICD-10-CM

## 2016-11-07 DIAGNOSIS — N189 Chronic kidney disease, unspecified: Secondary | ICD-10-CM

## 2016-11-07 DIAGNOSIS — E1122 Type 2 diabetes mellitus with diabetic chronic kidney disease: Secondary | ICD-10-CM

## 2016-11-07 LAB — BASIC METABOLIC PANEL
BUN: 34 mg/dL — AB (ref 4–21)
Creatinine: 1.3 mg/dL — AB (ref 0.5–1.1)
Glucose: 288 mg/dL
Potassium: 4 mmol/L (ref 3.4–5.3)
SODIUM: 142 mmol/L (ref 137–147)

## 2016-11-07 LAB — CBC AND DIFFERENTIAL
HEMATOCRIT: 28 % — AB (ref 36–46)
HEMOGLOBIN: 8.8 g/dL — AB (ref 12.0–16.0)
PLATELETS: 184 10*3/uL (ref 150–399)
WBC: 5 10^3/mL

## 2016-11-07 NOTE — Assessment & Plan Note (Signed)
Creatinine 1.35 discharge Repeat BMET 11/14/16

## 2016-11-07 NOTE — Progress Notes (Signed)
[facility] Room: 115  [id number] S, [place] [id number]  This is a nursing facility visit  for Post readmission within 30 days  Interim medical record and care since last [place] visit was updated with review of diagnostic studies and change in clinical status since last visit were documented.  HPI: Patient was admitted from the SNF 12/13 and remained in the hospital until 12/17. She was admitted with dyspnea and hypoxia with O2 sats of 82% on room air with a diagnosis of congestive heart failure exacerbation. Specifically she has grade 2 diastolic congestive heart failure .There was clinical improvement with IV Lasix. She was started on metoprolol & Irbesartan and the Lasix was transitioned to PO. She lost approximately 7 kg while hospitalized and diuresed  almost 13 L. There was some associated elevation in creatinine with diuresis. Discharge chest x-ray revealed no infiltrates and oxygen saturations were 97% on room air. The images were reviewed and compared with those prior to discharge previously. The previously noted pulmonary edema pattern and large left pleural effusion had resolved. Tube feedings were be continued at the SNF Labs the day of discharge revealed a creatinine 1.35, GFR 43, glucose 217  Review of systems: She has a raw area on her buttocks which Wound Care is addressing. She has no other active concerns. She was constipated but this resolved with medications in the hospital. She specifically denies any cardiopulmonary symptoms at this time. Constitutional: No fever,significant weight change, fatigue  Eyes: No redness, discharge, pain, vision change ENT/mouth: No nasal congestion,  purulent discharge, earache,change in hearing ,sore throat  Cardiovascular: No chest pain, palpitations,paroxysmal nocturnal dyspnea, claudication, edema  Respiratory: No cough, sputum production,hemoptysis,  DOE , significant snoring,apnea  Gastrointestinal: No heartburn,dysphagia,abdominal pain, nausea / vomiting,rectal bleeding, melena,change in bowels Genitourinary: No dysuria,hematuria, pyuria,  incontinence, nocturia Musculoskeletal: No joint stiffness, joint swelling, weakness,pain Dermatologic: No rash, pruritus Neurologic: No dizziness,headache,syncope, seizures, numbness , tingling Psychiatric: No significant anxiety , depression, insomnia, anorexia Endocrine: No change in hair/skin/ nails, excessive thirst, excessive hunger, excessive urination  Hematologic/lymphatic: No significant bruising, lymphadenopathy,abnormal bleeding Allergy/immunology: No itchy/ watery eyes, significant sneezing, urticaria, angioedema  Physical exam:  Pertinent or positive findings: She is alert and oriented.  Arcus senilis is present. There is slight esotropia of the left eye. The maxilla is edentulous. She has some hirsutism of the chin.  Second heart sound is accentuated. PEG tube is in place. The posterior tibial pulses are slightly decreased. She has valgus deformities of the knees with fusiform changes. There is some edema of the right upper extremity. Catheter is in place in the right biceps area. Clubbing of the nails beds suggested.  General appearance:Adequately nourished; no acute distress , increased work of breathing is present.   Lymphatic: No lymphadenopathy about the head, neck, axilla . Eyes: No conjunctival inflammation or lid edema is present. There is no scleral icterus. Ears:  External ear exam shows no significant lesions or deformities.   Nose:  External nasal examination shows no deformity or inflammation. Nasal mucosa are pink and moist without lesions ,exudates Oral exam: lips and gums are healthy appearing.There is no oropharyngeal erythema or exudate . Neck:  No thyromegaly, masses, tenderness noted.    Heart:  Normal rate and regular rhythm. S1  normal without gallop, murmur, click,  rub .  Lungs:Chest clear to auscultation without wheezes, rhonchi,rales , rubs. Abdomen:Bowel sounds are normal. Abdomen is soft and nontender with  no organomegaly, hernias,masses. GU: deferred  Extremities:  No cyanosis  Neurologic exam : Strength equal  in upper & lower extremities Balance,Rhomberg,finger to nose testing could not be completed due to clinical state Deep tendon reflexes are equalBut 0+ at the knees  Skin: Warm & dry w/o tenting. No significant  rash.  See summary under each active problem in the Problem List with associated updated therapeutic plan

## 2016-11-07 NOTE — Patient Instructions (Signed)
See Current Assessment & Plan in Problem List under specific Diagnosis 

## 2016-11-07 NOTE — Assessment & Plan Note (Signed)
An  A1c of 8% or less  is the safest goal for her; it is critical to prevent hypoglycemia. A1c is at goal ; 7.1% on 10/13/16.

## 2016-11-07 NOTE — Assessment & Plan Note (Signed)
Clinically she has no active cardiac decompensation at this time. Monitor for change in weight of 3 pounds in 24 hours or 5 pounds in 1 week

## 2016-11-07 NOTE — Assessment & Plan Note (Signed)
Continue Unasyn 3 g every 8 hours. Monitor for any diarrhea

## 2016-11-15 LAB — POCT ERYTHROCYTE SEDIMENTATION RATE, NON-AUTOMATED: Sed Rate: 79 mm

## 2016-11-16 ENCOUNTER — Encounter: Payer: Self-pay | Admitting: Internal Medicine

## 2016-11-16 ENCOUNTER — Non-Acute Institutional Stay (SKILLED_NURSING_FACILITY): Payer: Medicare Other | Admitting: Internal Medicine

## 2016-11-16 DIAGNOSIS — N183 Chronic kidney disease, stage 3 unspecified: Secondary | ICD-10-CM

## 2016-11-16 DIAGNOSIS — I5043 Acute on chronic combined systolic (congestive) and diastolic (congestive) heart failure: Secondary | ICD-10-CM

## 2016-11-16 DIAGNOSIS — E1122 Type 2 diabetes mellitus with diabetic chronic kidney disease: Secondary | ICD-10-CM | POA: Diagnosis not present

## 2016-11-16 MED ORDER — INSULIN GLARGINE 100 UNIT/ML ~~LOC~~ SOLN: 12.0000 [IU] | Freq: Every day | SUBCUTANEOUS | Status: DC

## 2016-11-16 NOTE — Assessment & Plan Note (Signed)
Clinically there is no active congestive heart failure. Weight is down slightly over 11 ounces since discharge.

## 2016-11-16 NOTE — Assessment & Plan Note (Signed)
Glimepiride will be discontinued and low-dose basal insulin initiated.

## 2016-11-16 NOTE — Progress Notes (Signed)
[facility] Room: 115  PCP: Vidal Schwalbe, MD [address] [place] [address]  This is a nursing facility follow up for specific acute issue of elevated glucoses and to assess present cardiovascular status..  Interim medical record and care since last [place] visit was updated with review of diagnostic studies and change in clinical status since last visit were documented.  HPI: The patient has had intermittent hyperglycemia, she is on the oral agent glimepiride. Diabetes is controlled as evidenced by an A1c of 7.1% on 10/13/16. As an inpatient she received sliding scale insulin. When discharged from the hospital 11/05/16 following treatment of acute on chronic by systolic diastolic congestive heart failure her weight was 194 pounds and 11.2 ounces. Weight today was 194 pounds.  [facility] NP assessed the patient on 12/20. Emphasis was on patient's goals. They centered around  Returning home & being independent, not"being a burden to others".  Review of systems: She's had no documented hypoglycemia but has had intermittent dimming of her vision. She also has had intermittent heart racing. She describes some benign positional vertigo symptoms as well. She describes decreased range of motion in the right upper extremity related to shoulder impingement syndrome. She denies other neurologic or cardiovascular or pulmonary symptoms. Constitutional: No fever,significant weight change, fatigue  Eyes: No redness, discharge, pain ENT/mouth: No nasal congestion,  purulent discharge, earache,change in hearing ,sore throat  Cardiovascular: No chest pain, paroxysmal nocturnal dyspnea, claudication, edema  Respiratory: No cough, sputum production,hemoptysis, DOE , significant snoring,apnea  Gastrointestinal: No heartburn,dysphagia,abdominal pain, nausea / vomiting,rectal bleeding, melena,change in bowels Genitourinary: No dysuria,hematuria, pyuria,   incontinence, nocturia Dermatologic: No rash, pruritus, change in appearance of skin Neurologic: No headache,syncope, seizures,or new  numbness , tingling Psychiatric: No significant anxiety , depression, insomnia, anorexia Endocrine: No change in hair/skin/ nails, excessive thirst, excessive hunger, excessive urination  Hematologic/lymphatic: No significant bruising, lymphadenopathy,abnormal bleeding Allergy/immunology: No itchy/ watery eyes, significant sneezing, urticaria, angioedema  Physical exam:  Pertinent or positive findings: She is alert, oriented  and intelligent  There is ptosis on the left with esotropia. The maxilla is edentulous. The left nasolabial fold is decreased. She has minor hirsutism of the chin.  PICC line is present in the right upper extremity. There is associated edema of the right upper extremity. This is nonpitting. She has exfoliative skin changes over the right upper extremity.  PEG tube in place. Valgus deformity of the knees is present. Posterior tibial pulses are slightly decreased. She has trace edema at the sock line. There is decreased range of motion of the right upper extremity.   the index fingers are relatively short compared to the other digits.    General appearance:Adequately nourished; no acute distress , increased work of breathing is present.   Lymphatic: No lymphadenopathy about the head, neck, axilla . Eyes: No conjunctival inflammation or lid edema is present. There is no scleral icterus. Ears:  External ear exam shows no significant lesions or deformities.   Nose:  External nasal examination shows no deformity or inflammation. Nasal mucosa are pink and moist without lesions ,exudates Oral exam: lips and gums are healthy appearing.There is no oropharyngeal erythema or exudate . Neck:  No thyromegaly, masses, tenderness noted.    Heart:  Normal rate and regular rhythm. S1 and S2 normal without gallop, murmur, click, rub .  Lungs:Chest clear  to auscultation without wheezes, rhonchi,rales , rubs. Abdomen:Bowel sounds are normal. Abdomen is soft and nontender with no  organomegaly, hernias,masses. GU: deferred  Extremities:  No cyanosis, clubbing,edema  Neurologic exam : Cn 2-7 intact Strength equal  in upper & lower extremities Balance,Rhomberg,finger to nose testing could not be completed due to clinical state Deep tendon reflexes are equal Skin: Warm & dry w/o tenting. No significant lesions or rash.    See summary under each active problem in the Problem List with associated updated therapeutic plan

## 2016-11-16 NOTE — Patient Instructions (Signed)
See Current Assessment & Plan in Problem List under specific Diagnosis 

## 2016-11-17 ENCOUNTER — Emergency Department (HOSPITAL_COMMUNITY): Payer: Medicare Other

## 2016-11-17 ENCOUNTER — Inpatient Hospital Stay (HOSPITAL_COMMUNITY)
Admission: EM | Admit: 2016-11-17 | Discharge: 2016-11-23 | DRG: 291 | Disposition: A | Discharge status: Skilled Nursing Facility | Payer: Medicare Other | Attending: Internal Medicine | Admitting: Internal Medicine

## 2016-11-17 ENCOUNTER — Encounter (HOSPITAL_COMMUNITY): Payer: Self-pay

## 2016-11-17 DIAGNOSIS — R0682 Tachypnea, not elsewhere classified: Secondary | ICD-10-CM | POA: Diagnosis not present

## 2016-11-17 DIAGNOSIS — Z1612 Extended spectrum beta lactamase (ESBL) resistance: Secondary | ICD-10-CM | POA: Diagnosis present

## 2016-11-17 DIAGNOSIS — J9601 Acute respiratory failure with hypoxia: Secondary | ICD-10-CM | POA: Diagnosis present

## 2016-11-17 DIAGNOSIS — E1151 Type 2 diabetes mellitus with diabetic peripheral angiopathy without gangrene: Secondary | ICD-10-CM | POA: Diagnosis present

## 2016-11-17 DIAGNOSIS — I2510 Atherosclerotic heart disease of native coronary artery without angina pectoris: Secondary | ICD-10-CM | POA: Diagnosis present

## 2016-11-17 DIAGNOSIS — Z882 Allergy status to sulfonamides status: Secondary | ICD-10-CM

## 2016-11-17 DIAGNOSIS — E1165 Type 2 diabetes mellitus with hyperglycemia: Secondary | ICD-10-CM | POA: Diagnosis present

## 2016-11-17 DIAGNOSIS — I252 Old myocardial infarction: Secondary | ICD-10-CM | POA: Diagnosis not present

## 2016-11-17 DIAGNOSIS — J9621 Acute and chronic respiratory failure with hypoxia: Secondary | ICD-10-CM | POA: Diagnosis present

## 2016-11-17 DIAGNOSIS — I428 Other cardiomyopathies: Secondary | ICD-10-CM

## 2016-11-17 DIAGNOSIS — R0902 Hypoxemia: Secondary | ICD-10-CM

## 2016-11-17 DIAGNOSIS — I5023 Acute on chronic systolic (congestive) heart failure: Secondary | ICD-10-CM | POA: Diagnosis not present

## 2016-11-17 DIAGNOSIS — I130 Hypertensive heart and chronic kidney disease with heart failure and stage 1 through stage 4 chronic kidney disease, or unspecified chronic kidney disease: Secondary | ICD-10-CM | POA: Diagnosis not present

## 2016-11-17 DIAGNOSIS — N183 Chronic kidney disease, stage 3 unspecified: Secondary | ICD-10-CM

## 2016-11-17 DIAGNOSIS — I959 Hypotension, unspecified: Secondary | ICD-10-CM | POA: Diagnosis present

## 2016-11-17 DIAGNOSIS — Z66 Do not resuscitate: Secondary | ICD-10-CM | POA: Diagnosis present

## 2016-11-17 DIAGNOSIS — Z7982 Long term (current) use of aspirin: Secondary | ICD-10-CM | POA: Diagnosis not present

## 2016-11-17 DIAGNOSIS — E1122 Type 2 diabetes mellitus with diabetic chronic kidney disease: Secondary | ICD-10-CM | POA: Diagnosis present

## 2016-11-17 DIAGNOSIS — R410 Disorientation, unspecified: Secondary | ICD-10-CM

## 2016-11-17 DIAGNOSIS — I5043 Acute on chronic combined systolic (congestive) and diastolic (congestive) heart failure: Secondary | ICD-10-CM

## 2016-11-17 DIAGNOSIS — E119 Type 2 diabetes mellitus without complications: Secondary | ICD-10-CM

## 2016-11-17 DIAGNOSIS — Z833 Family history of diabetes mellitus: Secondary | ICD-10-CM | POA: Diagnosis not present

## 2016-11-17 DIAGNOSIS — B9620 Unspecified Escherichia coli [E. coli] as the cause of diseases classified elsewhere: Secondary | ICD-10-CM | POA: Diagnosis present

## 2016-11-17 DIAGNOSIS — Z931 Gastrostomy status: Secondary | ICD-10-CM

## 2016-11-17 DIAGNOSIS — E873 Alkalosis: Secondary | ICD-10-CM | POA: Diagnosis present

## 2016-11-17 DIAGNOSIS — R609 Edema, unspecified: Secondary | ICD-10-CM | POA: Diagnosis not present

## 2016-11-17 DIAGNOSIS — M6281 Muscle weakness (generalized): Secondary | ICD-10-CM

## 2016-11-17 DIAGNOSIS — I471 Supraventricular tachycardia: Secondary | ICD-10-CM | POA: Diagnosis present

## 2016-11-17 DIAGNOSIS — N390 Urinary tract infection, site not specified: Secondary | ICD-10-CM | POA: Diagnosis present

## 2016-11-17 DIAGNOSIS — L89154 Pressure ulcer of sacral region, stage 4: Secondary | ICD-10-CM | POA: Diagnosis present

## 2016-11-17 DIAGNOSIS — N184 Chronic kidney disease, stage 4 (severe): Secondary | ICD-10-CM | POA: Diagnosis present

## 2016-11-17 DIAGNOSIS — B9629 Other Escherichia coli [E. coli] as the cause of diseases classified elsewhere: Secondary | ICD-10-CM | POA: Diagnosis present

## 2016-11-17 DIAGNOSIS — J189 Pneumonia, unspecified organism: Secondary | ICD-10-CM

## 2016-11-17 DIAGNOSIS — I509 Heart failure, unspecified: Secondary | ICD-10-CM | POA: Diagnosis not present

## 2016-11-17 DIAGNOSIS — I4719 Other supraventricular tachycardia: Secondary | ICD-10-CM

## 2016-11-17 DIAGNOSIS — M79609 Pain in unspecified limb: Secondary | ICD-10-CM | POA: Diagnosis not present

## 2016-11-17 LAB — I-STAT ARTERIAL BLOOD GAS, ED
Acid-Base Excess: 2 mmol/L (ref 0.0–2.0)
BICARBONATE: 23.7 mmol/L (ref 20.0–28.0)
BICARBONATE: 25.4 mmol/L (ref 20.0–28.0)
O2 Saturation: 90 %
O2 Saturation: 91 %
PCO2 ART: 33.9 mmHg (ref 32.0–48.0)
PO2 ART: 57 mmHg — AB (ref 83.0–108.0)
Patient temperature: 98.6
Patient temperature: 98.6
TCO2: 25 mmol/L (ref 0–100)
TCO2: 26 mmol/L (ref 0–100)
pCO2 arterial: 35.2 mmHg (ref 32.0–48.0)
pH, Arterial: 7.452 — ABNORMAL HIGH (ref 7.350–7.450)
pH, Arterial: 7.466 — ABNORMAL HIGH (ref 7.350–7.450)
pO2, Arterial: 56 mmHg — ABNORMAL LOW (ref 83.0–108.0)

## 2016-11-17 LAB — GLUCOSE, CAPILLARY: GLUCOSE-CAPILLARY: 139 mg/dL — AB (ref 65–99)

## 2016-11-17 LAB — URINALYSIS, ROUTINE W REFLEX MICROSCOPIC
BILIRUBIN URINE: NEGATIVE
GLUCOSE, UA: NEGATIVE mg/dL
KETONES UR: NEGATIVE mg/dL
NITRITE: NEGATIVE
PH: 8.5 — AB (ref 5.0–8.0)
PROTEIN: 30 mg/dL — AB
Specific Gravity, Urine: 1.015 (ref 1.005–1.030)

## 2016-11-17 LAB — CBC WITH DIFFERENTIAL/PLATELET
BASOS ABS: 0 10*3/uL (ref 0.0–0.1)
BASOS PCT: 0 %
EOS ABS: 0.2 10*3/uL (ref 0.0–0.7)
EOS PCT: 3 %
HEMATOCRIT: 27.7 % — AB (ref 36.0–46.0)
Hemoglobin: 9.2 g/dL — ABNORMAL LOW (ref 12.0–15.0)
Lymphocytes Relative: 17 %
Lymphs Abs: 1.2 10*3/uL (ref 0.7–4.0)
MCH: 29.8 pg (ref 26.0–34.0)
MCHC: 33.2 g/dL (ref 30.0–36.0)
MCV: 89.6 fL (ref 78.0–100.0)
MONO ABS: 0.5 10*3/uL (ref 0.1–1.0)
MONOS PCT: 7 %
NEUTROS ABS: 5 10*3/uL (ref 1.7–7.7)
Neutrophils Relative %: 73 %
PLATELETS: 248 10*3/uL (ref 150–400)
RBC: 3.09 MIL/uL — ABNORMAL LOW (ref 3.87–5.11)
RDW: 18.5 % — AB (ref 11.5–15.5)
WBC: 6.8 10*3/uL (ref 4.0–10.5)

## 2016-11-17 LAB — URINALYSIS, MICROSCOPIC (REFLEX)

## 2016-11-17 LAB — BASIC METABOLIC PANEL
Anion gap: 8 (ref 5–15)
BUN: 34 mg/dL — AB (ref 6–20)
CALCIUM: 9.7 mg/dL (ref 8.9–10.3)
CO2: 25 mmol/L (ref 22–32)
CREATININE: 1.07 mg/dL — AB (ref 0.44–1.00)
Chloride: 104 mmol/L (ref 101–111)
GFR calc Af Amer: 57 mL/min — ABNORMAL LOW (ref >60)
GFR, EST NON AFRICAN AMERICAN: 49 mL/min — AB (ref >60)
GLUCOSE: 179 mg/dL — AB (ref 65–99)
Potassium: 4.2 mmol/L (ref 3.5–5.1)
Sodium: 137 mmol/L (ref 135–145)

## 2016-11-17 LAB — CBG MONITORING, ED
GLUCOSE-CAPILLARY: 197 mg/dL — AB (ref 65–99)
Glucose-Capillary: 136 mg/dL — ABNORMAL HIGH (ref 65–99)

## 2016-11-17 MED ORDER — FREE WATER
100.0000 mL | Freq: Three times a day (TID) | Status: DC
  Administered 2016-11-17 – 2016-11-23 (×15): 100 mL

## 2016-11-17 MED ORDER — ASPIRIN EC 81 MG PO TBEC: 81.0000 mg | DELAYED_RELEASE_TABLET | Freq: Every day | ORAL | Status: DC

## 2016-11-17 MED ORDER — PANTOPRAZOLE SODIUM 40 MG PO TBEC
40.0000 mg | DELAYED_RELEASE_TABLET | Freq: Every day | ORAL | Status: DC
  Administered 2016-11-18: 40 mg via ORAL
  Filled 2016-11-17: qty 1

## 2016-11-17 MED ORDER — METOPROLOL TARTRATE 25 MG PO TABS
25.0000 mg | ORAL_TABLET | Freq: Two times a day (BID) | ORAL | Status: DC
  Administered 2016-11-18 – 2016-11-23 (×10): 25 mg
  Filled 2016-11-17 (×12): qty 1

## 2016-11-17 MED ORDER — SODIUM CHLORIDE 0.9 % IV BOLUS (SEPSIS)
500.0000 mL | Freq: Once | INTRAVENOUS | Status: AC
  Administered 2016-11-17: 500 mL via INTRAVENOUS

## 2016-11-17 MED ORDER — IRBESARTAN 75 MG PO TABS
37.5000 mg | ORAL_TABLET | Freq: Every day | ORAL | Status: DC
  Administered 2016-11-18: 37.5 mg via ORAL
  Filled 2016-11-17: qty 1

## 2016-11-17 MED ORDER — ENOXAPARIN SODIUM 40 MG/0.4ML ~~LOC~~ SOLN
40.0000 mg | SUBCUTANEOUS | Status: DC
  Administered 2016-11-17 – 2016-11-22 (×6): 40 mg via SUBCUTANEOUS
  Filled 2016-11-17 (×6): qty 0.4

## 2016-11-17 MED ORDER — SODIUM CHLORIDE 0.9% FLUSH
3.0000 mL | Freq: Two times a day (BID) | INTRAVENOUS | Status: DC
  Administered 2016-11-19 – 2016-11-22 (×3): 3 mL via INTRAVENOUS

## 2016-11-17 MED ORDER — INSULIN GLARGINE 100 UNIT/ML ~~LOC~~ SOLN
12.0000 [IU] | Freq: Every day | SUBCUTANEOUS | Status: DC
  Administered 2016-11-17 – 2016-11-22 (×6): 12 [IU] via SUBCUTANEOUS
  Filled 2016-11-17 (×7): qty 0.12

## 2016-11-17 MED ORDER — FUROSEMIDE 10 MG/ML IJ SOLN
40.0000 mg | Freq: Two times a day (BID) | INTRAMUSCULAR | Status: DC
  Administered 2016-11-18 – 2016-11-19 (×4): 40 mg via INTRAVENOUS
  Filled 2016-11-17 (×4): qty 4

## 2016-11-17 MED ORDER — IOPAMIDOL (ISOVUE-370) INJECTION 76%
INTRAVENOUS | Status: AC
  Administered 2016-11-17: 100 mL via INTRAVENOUS
  Filled 2016-11-17: qty 100

## 2016-11-17 MED ORDER — NUTREN 2.0 PO LIQD: 40.0000 mL/h | ORAL | Status: DC

## 2016-11-17 MED ORDER — OSMOLITE 1.5 CAL PO LIQD
1000.0000 mL | ORAL | Status: DC
  Administered 2016-11-18: 1000 mL
  Filled 2016-11-17 (×3): qty 1000

## 2016-11-17 MED ORDER — NORTRIPTYLINE HCL 10 MG PO CAPS
10.0000 mg | ORAL_CAPSULE | Freq: Every day | ORAL | Status: DC
  Administered 2016-11-18: 10 mg via ORAL
  Filled 2016-11-17 (×2): qty 1

## 2016-11-17 NOTE — ED Notes (Signed)
Pt. Family at bedside.

## 2016-11-17 NOTE — ED Notes (Signed)
Patient transported to CT 

## 2016-11-17 NOTE — Progress Notes (Signed)
Late entry: PT had 5 beats of SVT a few min ago. Pt a little SOB with sats at 96 on room air. Applied 2LPM of oxygen for comfort. BP 141/101 admin. Scheduled Lopressor. Let on call MD know. Magnesium was ordered. Will continue to monitor.   Eleanora Neighbor, RN

## 2016-11-17 NOTE — ED Provider Notes (Signed)
[facility] DEPT Provider Note   CSN: [id number] Arrival date & time: 11/17/16  1150     History   Chief Complaint Chief Complaint  Patient presents with  . Hyperglycemia  . Altered Mental Status    HPI Mandy Rhodes is a 75 y.o. female.  Patient was sent here from her rehabilitation facility, for evaluation of elevated heart rate, and confusion. She is in rehabilitation following hospitalization for congestive heart failure. She was seen yesterday by her PCP felt that she was euvolemic, and at her baseline. Today at the facility. Her blood pressure was 150/101 with a heart rate of 102. Respiratory rate was elevated at 41, temperature 97.2, and the oxygen saturation 88%. Her PCP stopped her from the Glyburide and started low-dose daily, insulin. This was apparently done for hyperglycemia. She recently had a laryngeal mass removed, with postoperative complication of esophageal perforation. She is receiving nutritional supplement through her PEG tube. She states that she walks with a walker. There are no other known modifying factors.  HPI  Past Medical History:  Diagnosis Date  . Anemia   . Arthritis   . CAD (coronary artery disease)    a. minimal CAD by cath in 03/2016  . CHF (congestive heart failure) ([place])    a. 09/2016: Echo with EF of 30-35%, moderate diffuse HK, Grade 2 DD, PA peak pressure of 39 mm Hg.  Mandy Rhodes Chronic Rhodes disease    Renal Insuffiency, see Mandy Mandy Rhodes once a year  . Diabetes mellitus    type 2  . GERD (gastroesophageal reflux disease)   . History of rotator cuff tear    Torn right rotator cuff.  . History of spinal stenosis   . Hypertension   . Infiltrative cardiomyopathy ([place])    a, presumed cardiac amyloidosis by MRI with prior nondiagnostic fat pad biopsy  . Neuropathy associated with endocrine disorder [facility])     Patient Active Problem List   Diagnosis Date Noted  . Dysphagia 11/01/2016  . Esophageal perforation   . Vertebral  osteomyelitis ([place])   . Acute encephalopathy   . Sepsis ([place]) 10/16/2016  . Laryngeal mass 10/16/2016  . Hyperosmolality and/or hypernatremia 10/16/2016  . Anemia 10/16/2016  . Hypokalemia 10/16/2016  . Demand ischemia ([place]) 10/16/2016  . CAP (community acquired pneumonia) 10/16/2016  . PAT (paroxysmal atrial tachycardia) ([place]) 10/14/2016  . Malnutrition of moderate degree 10/14/2016  . Acute respiratory failure with hypoxia ([place]) 10/13/2016  . Pressure injury of skin 10/13/2016  . Mass   . Acute Rhodes injury superimposed on CKD ([place])   . Acute on chronic combined systolic and diastolic congestive heart failure ([place])   . Edema 08/23/2016  . Shortness of breath 07/14/2016  . Medication management 07/14/2016  . Non-ischemic cardiomyopathy ([place]) 07/14/2016  . Hyperlipidemia 07/14/2016  . Chronic combined systolic and diastolic heart failure ([place]) 04/13/2016  . CKD (chronic Rhodes disease)   . Acute systolic congestive heart failure, NYHA class 3 ([place]) 03/19/2016  . Cough   . Cardiomegaly 03/17/2016  . Elevated troponin 03/17/2016  . Essential hypertension 03/17/2016  . Diabetes mellitus ([place]) 09/29/2011    Past Surgical History:  Procedure Laterality Date  . ABDOMINAL HYSTERECTOMY  1971   Partial  . ANTERIOR CERVICAL DECOMP/DISCECTOMY FUSION N/A 10/18/2016   Procedure: debridement and drainage cervical prevertebral abscess  ANTERIOR CERVICAL HARDWARE REMOVAL exploration cervical fusion;  Surgeon: Earnie Larsson, MD;  Location: [facility];  Service: Neurosurgery;  Laterality: N/A;  . APPENDECTOMY    . BACK  SURGERY  2009   Disectomy  . CARDIAC CATHETERIZATION N/A 03/22/2016   Procedure: Left Heart Cath and Coronary Angiography;  Surgeon: Peter M Martinique, MD;  Location: [facility];  Service: Cardiovascular;  Laterality: N/A;  . CHOLECYSTECTOMY  1969   Status post   . CYST REMOVAL TRUNK Right 12/03/2014   Procedure: EXCISION OF RIGHT BACK CYST ;  Surgeon: Coralie Keens, MD;   Location: [facility];  Service: General;  Laterality: Right;  . DIRECT LARYNGOSCOPY N/A 10/17/2016   Procedure: DIRECT LARYNGOSCOPY,  ESOPHAGOSCOPY;  Surgeon: [name], MD;  Location: [facility];  Service: ENT;  Laterality: N/A;  . FOOT SURGERY Bilateral    bone removed  . IR GENERIC HISTORICAL  10/25/2016   IR GASTROSTOMY TUBE MOD SED 10/25/2016 [name], MD [facility]-INTER[facility] RAD  . NEUROPLASTY / TRANSPOSITION MEDIAN NERVE AT CARPAL TUNNEL Bilateral    Hx  Left carpal tunnel repair  . TRACHEOSTOMY TUBE PLACEMENT N/A 10/17/2016   Procedure: TRACHEOSTOMY;  Surgeon: [name], MD;  Location: [facility];  Service: ENT;  Laterality: N/A;    OB History    No data available       Home Medications    Prior to Admission medications   Medication Sig Start Date End Date Taking? Authorizing Provider  Amino Acids-Protein Hydrolys (FEEDING SUPPLEMENT, PRO-STAT SUGAR FREE 64,) LIQD Take 30 mLs by mouth daily.    Historical Provider, MD  ampicillin-sulbactam (UNASYN) IVPB Inject 3 g into the vein every 8 (eight) hours. End Date 11/17/2016    Historical Provider, MD  aspirin EC 81 MG EC tablet Take 1 tablet (81 mg total) by mouth daily. 03/23/16   Shanker Kristeen Mans, MD  Calcium Carb-Cholecalciferol (CALCIUM 600 + D PO) Take 1 tablet by mouth every morning.    Historical Provider, MD  cholecalciferol (VITAMIN D) 1000 UNITS tablet Take 1,000 Units by mouth daily. Reported on 03/27/2016    Historical Provider, MD  ferrous sulfate 325 (65 FE) MG EC tablet Take 325 mg by mouth daily.     Historical Provider, MD  irbesartan (AVAPRO) 75 MG tablet Take 0.5 tablets (37.5 mg total) by mouth daily. 11/06/16   Debbe Odea, MD  metoprolol tartrate (LOPRESSOR) 25 MG tablet Place 1 tablet (25 mg total) into feeding tube 2 (two) times daily. 11/05/16   Debbe Odea, MD  Multiple Vitamin (MULTIVITAMIN) tablet Take 1 tablet by mouth daily.      Historical Provider, MD  nortriptyline (PAMELOR) 10 MG capsule Take 10 mg by mouth  at bedtime.    Historical Provider, MD  Nutritional Supplements (NUTREN 2.0) LIQD Take 40 mL/hr by mouth continuous.    Historical Provider, MD  omeprazole (PRILOSEC) 40 MG capsule Take 40 mg by mouth daily.      Historical Provider, MD  OXYGEN Inhale 2 L into the lungs daily as needed (for < 90% sats).    Historical Provider, MD  Water For Irrigation, Sterile (FREE WATER) SOLN Place 100 mLs into feeding tube See admin instructions. 100 cc Q 8 hrs only 11/05/16   Debbe Odea, MD    Family History Family History  Problem Relation Age of Onset  . Diabetes type II Mother   . Hypertension Father   . Diabetes type II Sister   . Ovarian cancer Sister   . Diabetes type II Other   . Stroke Neg Hx   . CAD Neg Hx   . Heart failure Neg Hx     Social History Social  History  Substance Use Topics  . Smoking status: Former Research scientist (life sciences)  . Smokeless tobacco: Never Used     Comment: 12/02/13- quit over 20 years ago  . Alcohol use No     Allergies   Sulfa antibiotics   Review of Systems Review of Systems  All other systems reviewed and are negative.    Physical Exam Updated Vital Signs BP 148/78   Pulse 92   Temp 97.4 F (36.3 C) (Oral)   Resp (!) 28   Ht 5\' 6"  (1.676 m)   Wt 195 lb (88.5 kg)   SpO2 92%   BMI 31.47 kg/m   Physical Exam  Constitutional: She is oriented to person, place, and time. She appears well-developed. She is cooperative. She has a sickly appearance. No distress.  Elderly, debilitated  HENT:  Head: Normocephalic and atraumatic.  Eyes: Conjunctivae and EOM are normal. Pupils are equal, round, and reactive to light.  Neck: Normal range of motion and phonation normal. Neck supple.  Cardiovascular: Regular rhythm.   Tachycardic  Pulmonary/Chest: Effort normal and breath sounds normal. She exhibits no tenderness.  Tachypneic  Abdominal: Soft. She exhibits no distension. There is no tenderness. There is no guarding.  Peg tube left upper quadrant, site appears  normal.  Musculoskeletal: Normal range of motion.  Decreased movement arms and legs, symmetric weakness.  Neurological: She is alert and oriented to person, place, and time. She exhibits normal muscle tone.  Skin: Skin is warm and dry.  Psychiatric: She has a normal mood and affect. Her behavior is normal.  Nursing note and vitals reviewed.    ED Treatments / Results  Labs (all labs ordered are listed, but only abnormal results are displayed) Labs Reviewed  BASIC METABOLIC PANEL - Abnormal; Notable for the following:       Result Value   Glucose, Bld 179 (*)    BUN 34 (*)    Creatinine, Ser 1.07 (*)    GFR calc non Af Amer 49 (*)    GFR calc Af Amer 57 (*)    All other components within normal limits  CBC WITH DIFFERENTIAL/PLATELET - Abnormal; Notable for the following:    RBC 3.09 (*)    Hemoglobin 9.2 (*)    HCT 27.7 (*)    RDW 18.5 (*)    All other components within normal limits  URINALYSIS, ROUTINE W REFLEX MICROSCOPIC - Abnormal; Notable for the following:    APPearance CLOUDY (*)    pH 8.5 (*)    Hgb urine dipstick LARGE (*)    Protein, ur 30 (*)    Leukocytes, UA MODERATE (*)    All other components within normal limits  URINALYSIS, MICROSCOPIC (REFLEX) - Abnormal; Notable for the following:    Bacteria, UA MANY (*)    Squamous Epithelial / LPF 6-30 (*)    All other components within normal limits  CBG MONITORING, ED - Abnormal; Notable for the following:    Glucose-Capillary 197 (*)    All other components within normal limits    EKG  EKG Interpretation None       Radiology Ct Head Wo Contrast  Result Date: 11/17/2016 CLINICAL DATA:  Dizziness, confusion EXAM: CT HEAD WITHOUT CONTRAST TECHNIQUE: Contiguous axial images were obtained from the base of the skull through the vertex without intravenous contrast. COMPARISON:  10/13/2016 FINDINGS: Brain: There is atrophy and chronic small vessel disease changes. No acute intracranial abnormality. Specifically,  no hemorrhage, hydrocephalus, mass lesion, acute infarction, or significant intracranial  injury. Vascular: No hyperdense vessel or unexpected calcification. Skull: No acute calvarial abnormality. Sinuses/Orbits: Visualized paranasal sinuses and mastoids clear. Orbital soft tissues unremarkable. Other: None IMPRESSION: No acute intracranial abnormality. Atrophy, chronic microvascular disease. Electronically Signed   By: Rolm Baptise M.D.   On: 11/17/2016 15:59   Ct Angio Chest Pe W/cm &/or Wo Cm  Result Date: 11/17/2016 CLINICAL DATA:  Shortness of Breath since yesterday. EXAM: CT ANGIOGRAPHY CHEST WITH CONTRAST TECHNIQUE: Multidetector CT imaging of the chest was performed using the standard protocol during bolus administration of intravenous contrast. Multiplanar CT image reconstructions and MIPs were obtained to evaluate the vascular anatomy. CONTRAST:  100 cc Isovue 370 COMPARISON:  10/13/2016 FINDINGS: Cardiovascular: The heart is enlarged. No pericardial effusion. Coronary artery calcification is noted. Atherosclerotic calcification is noted in the wall of the thoracic aorta. No filling defect in the opacified pulmonary arteries to suggest the presence of an acute pulmonary embolus. Right PICC line tip is positioned in the distal SVC. Mediastinum/Nodes: Upper normal paratracheal lymph nodes are evident an 11 mm subcarinal short axis lymph node is upper normal to borderline increased. No hilar lymphadenopathy. Fluid in the esophagus may be related to dysmotility or reflux. Lungs/Pleura: Interlobular septal thickening with diffuse ground-glass attenuation is noted bilaterally. Small bilateral pleural effusions are associated with compressive atelectasis in the lower lobes bilaterally. Scattered areas of ground-glass nodularity likely related to the more diffuse underlying airspace disease. Upper Abdomen: Unremarkable. Musculoskeletal: Bone windows reveal no worrisome lytic or sclerotic osseous lesions.  Degenerative changes are seen in the shoulders bilaterally. Review of the MIP images confirms the above findings. IMPRESSION: 1. No CT evidence for acute pulmonary embolus. 2. Diffuse interlobular and ground-glass airspace opacity suggests pulmonary edema. 3. Bibasilar dependent atelectasis with small bilateral pleural effusions. Electronically Signed   By: Misty Stanley M.D.   On: 11/17/2016 16:18    Procedures Procedures (including critical care time)  Medications Ordered in ED Medications  sodium chloride 0.9 % bolus 500 mL (0 mLs Intravenous Stopped 11/17/16 1503)  iopamidol (ISOVUE-370) 76 % injection (100 mLs Intravenous Contrast Given 11/17/16 1603)     Initial Impression / Assessment and Plan / ED Course  I have reviewed the triage vital signs and the nursing notes.  Pertinent labs & imaging results that were available during my care of the patient were reviewed by me and considered in my medical decision making (see chart for details).  Clinical Course     Medications  sodium chloride 0.9 % bolus 500 mL (0 mLs Intravenous Stopped 11/17/16 1503)  iopamidol (ISOVUE-370) 76 % injection (100 mLs Intravenous Contrast Given 11/17/16 1603)    Patient Vitals for the past 24 hrs:  BP Temp Temp src Pulse Resp SpO2 Height Weight  11/17/16 1530 148/78 - - 92 (!) 28 92 % - -  11/17/16 1500 147/95 - - 95 (!) 35 92 % - -  11/17/16 1430 155/81 - - 93 (!) 47 92 % - -  11/17/16 1400 151/98 - - 110 (!) 32 92 % - -  11/17/16 1330 (!) 151/104 - - 94 (!) 39 92 % - -  11/17/16 1300 145/95 - - 96 (!) 34 93 % - -  11/17/16 1230 152/85 - - 88 (!) 34 93 % - -  11/17/16 1219 - - - - - - 5\' 6"  (1.676 m) 195 lb (88.5 kg)  11/17/16 1200 132/92 97.4 F (36.3 C) Oral 86 (!) 35 96 % - -  11/17/16 1151 - - - - -  95 % - -    4:40 PM Reevaluation with update and discussion. After initial assessment and treatment, an updated evaluation reveals She remains alert and comfortable. She is lucid and  cooperative. Findings discussed with the patient and all questions were answered. Neelam Tiggs L   4:48 PM-Consult complete with Dr. Verlon Au. Patient case explained and discussed. He wants to see her in the ED, before agreeing to admit patient for further evaluation and treatment. Call ended at 16:50  Final Clinical Impressions(s) / ED Diagnoses   Final diagnoses:  Hypoxia  Confusion  Tachypnea  Acute on chronic systolic congestive heart failure ([place])    Complicated patient, with recent surgery resulting in esophageal perforation, presenting with acute confusion, tachypnea and new oxygen requirement. No evidence for pneumonia or PE. Likely mild congestive heart failure. Patient's fluid status will be difficult to manage from the setting of a skilled nursing facility. Therefore, she will likely need hospitalization for close management gentle diuresis and possibly cardiology consultation. Doubt  metabolic instability, ACS or pneumonia.    Nursing Notes Reviewed/ Care Coordinated, and agree without changes. Applicable Imaging Reviewed.  Interpretation of Laboratory Data incorporated into ED treatment  Plan: As per Dr. Verlon Au in conjunction with oncoming provider team  New Prescriptions New Prescriptions   No medications on file     Daleen Bo, MD 11/18/16 [phone]

## 2016-11-17 NOTE — ED Triage Notes (Addendum)
Pt arrived via EMS from [facility] for hyperglycemia. EMS reports [facility] staff stating patient "more confused than normal; increased HR and in Afib; and a possible UTI." EMS reports pt in NSR and pt A&Ox4.  Pt has PICC in rt arm and gtube.

## 2016-11-17 NOTE — H&P (Signed)
[facility] History and Physical  Mandy Rhodes [id number] DOB: Nov 08, 1941 DOA: 11/17/2016  Consultants:  [facility] - cardiology; [facility] - ophthalmology; [facility] - nephrology; [facility] - neurosurgery Patient coming from: [facility]; NOK: daughter-in-law,[phone]  75 year old female, resident of [facility] Diabetes mellitus type 2, Current kidney disease stage II to 3 Systolic heart failure   [id number] EF 35-40 NYHA class II to 3  Cath 03/22/2016 nonobstructing CAD? 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 Linna Darner and was felt to be euvolemic on his assessment  Sent over from the nursing facility 12 29 because of confusion and altered mental status  Patient noted to be significantly tachypneic and slightly tachycardic up to 110 with respiratory rate up to 32 White count was 6.8 hemoglobin was 9.2 platelets are 248 which all seem to be her usual baseline BUN was 34 creatinine was 1.07 glucose was 179 CT angiogram showed mild groundglass opacity suggestive of pulmonary edema and bilateral small pleural effusion  ABG currently is pending given concerns for hypercarbia causing confusion  Review of Systems: The patient denies  Fever, chills, nausea, vomiting She feels some tightness in her neck from last week but does not say that she has any overt pain She has no dark stools no tarry stools She tells me her blood sugar was almost? 700 yesterday but currently it  is 1 79  Past Medical History:  Diagnosis Date  . Anemia   . Arthritis   . CAD (coronary artery disease)    a. minimal CAD by cath in 03/2016  . CHF (congestive heart failure) ([place])    a. 09/2016: Echo with EF of 30-35%, moderate diffuse HK, Grade 2 DD, PA peak pressure of 39 mm Hg.  [facility] Kitchen Chronic kidney disease    Renal Insuffiency, see [facility] once a year  . Diabetes mellitus    type 2  . GERD (gastroesophageal reflux disease)   . History of rotator cuff tear    Torn right rotator cuff.  . History of spinal stenosis   . Hypertension   . Infiltrative cardiomyopathy ([place])    a, presumed cardiac amyloidosis by MRI with prior nondiagnostic fat pad biopsy  . Neuropathy associated with endocrine disorder [facility])    Past Surgical History:  Procedure Laterality Date  . ABDOMINAL HYSTERECTOMY  1971   Partial  . ANTERIOR CERVICAL DECOMP/DISCECTOMY FUSION N/A 10/18/2016   Procedure: debridement and drainage cervical prevertebral abscess  ANTERIOR CERVICAL HARDWARE REMOVAL exploration cervical fusion;  Surgeon: Earnie Larsson, MD;  Location: [facility];  Service: Neurosurgery;  Laterality: N/A;  . APPENDECTOMY    . BACK SURGERY  2009   Disectomy  . CARDIAC CATHETERIZATION N/A 03/22/2016   Procedure: Left Heart Cath and Coronary Angiography;  Surgeon: Peter M Martinique, MD;  Location: [place] [facility];  Service: Cardiovascular;  Laterality: N/A;  . CHOLECYSTECTOMY  1969   Status post   . CYST REMOVAL TRUNK Right 12/03/2014   Procedure: EXCISION OF RIGHT BACK CYST ;  Surgeon: Coralie Keens, MD;  Location: [place];  Service:  General;  Laterality: Right;  . DIRECT LARYNGOSCOPY N/A 10/17/2016   Procedure: DIRECT LARYNGOSCOPY,  ESOPHAGOSCOPY;  Surgeon: [name], MD;  Location: [facility];  Service: ENT;  Laterality: N/A;  . FOOT SURGERY Bilateral    bone removed  . IR GENERIC HISTORICAL  10/25/2016   IR GASTROSTOMY TUBE MOD SED 10/25/2016 [name], MD [facility]INTER[facility]  . NEUROPLASTY /  TRANSPOSITION MEDIAN NERVE AT CARPAL TUNNEL Bilateral    Hx  Left carpal tunnel repair  . TRACHEOSTOMY TUBE PLACEMENT N/A 10/17/2016   Procedure: TRACHEOSTOMY;  Surgeon: [name], MD;  Location: [facility];  Service: ENT;  Laterality: N/A;   Social History:  Social History   Social History Narrative  . No narrative on file    Allergies  Allergen Reactions  . Sulfa Antibiotics Swelling    Family History  Problem Relation Age of Onset  . Diabetes type II Mother   . Hypertension Father   . Diabetes type II Sister   . Ovarian cancer Sister   . Diabetes type II Other   . Stroke Neg Hx   . CAD Neg Hx   . Heart failure Neg Hx     Prior to Admission medications   Medication Sig Start Date End Date Taking? Authorizing Provider  Amino Acids-Protein Hydrolys (FEEDING SUPPLEMENT, PRO-STAT SUGAR FREE 64,) LIQD Take 30 mLs by mouth daily.    Historical Provider, MD  ampicillin-sulbactam (UNASYN) IVPB Inject 3 g into the vein every 8 (eight) hours. End Date 11/17/2016    Historical Provider, MD  aspirin EC 81 MG EC tablet Take 1 tablet (81 mg total) by mouth daily. 03/23/16   Shanker Kristeen Mans, MD  Calcium Carb-Cholecalciferol (CALCIUM 600 + D PO) Take 1 tablet by mouth every morning.    Historical Provider, MD  cholecalciferol (VITAMIN D) 1000 UNITS tablet Take 1,000 Units by mouth daily. Reported on 03/27/2016    Historical Provider, MD  ferrous sulfate 325 (65 FE) MG EC tablet Take 325 mg by mouth daily.     Historical Provider, MD  irbesartan (AVAPRO) 75 MG tablet Take 0.5 tablets (37.5 mg total) by mouth daily. 11/06/16   Debbe Odea, MD  metoprolol tartrate (LOPRESSOR) 25 MG tablet Place 1 tablet (25 mg total) into feeding tube 2 (two) times daily. 11/05/16   Debbe Odea, MD  Multiple Vitamin (MULTIVITAMIN) tablet Take 1 tablet by mouth daily.      Historical Provider, MD  nortriptyline (PAMELOR) 10 MG capsule Take 10 mg by mouth at bedtime.    Historical Provider, MD  Nutritional  Supplements (NUTREN 2.0) LIQD Take 40 mL/hr by mouth continuous.    Historical Provider, MD  omeprazole (PRILOSEC) 40 MG capsule Take 40 mg by mouth daily.      Historical Provider, MD  OXYGEN Inhale 2 L into the lungs daily as needed (for < 90% sats).    Historical Provider, MD  Water For Irrigation, Sterile (FREE WATER) SOLN Place 100 mLs into feeding tube See admin instructions. 100 cc Q 8 hrs only 11/05/16   Debbe Odea, MD   Physical Exam: Vitals:   11/17/16 1645 11/17/16 1700 11/17/16 1715 11/17/16 1730  BP:      Pulse: 89 93 93 91  Resp: (!) 31 (!) 41 (!) 44 (!) 35  Temp:      TempSrc:      SpO2: 95% 93% 93% 94%  Weight:      Height:         eomi ncat  Alert pleasant oreinted and can tell me what's goin on  cta b, no rales no rhonchi although visibly patient is taking labored and short breaths  Abdomen is soft nontender nondistended with no rebound not appreciate organomegaly  Neurological is intact and has a little bit of restriction of motion to the left side I do not feel any significant stiffness in the neck per se but she is postop so unclear how reliable that would the  She has a dense deficit to her right side in terms of moving the right upper extremity and moves only a little bit her right lower extremity.  She has no lower extremity edema I do not appreciate any JVD  I do not appreciate any rash  Labs on Admission:  Basic Metabolic Panel:  Recent Labs Lab 11/17/16 1330  NA 137  K 4.2  CL 104  CO2 25  GLUCOSE 179*  BUN 34*  CREATININE 1.07*  CALCIUM 9.7   Liver Function Tests: No results for input(s): AST, ALT, ALKPHOS, BILITOT, PROT, ALBUMIN in the last 168 hours. No results for input(s): LIPASE, AMYLASE in the last 168 hours. No results for input(s): AMMONIA in the last 168 hours. CBC:  Recent Labs Lab 11/17/16 1330  WBC 6.8  NEUTROABS 5.0  HGB 9.2*  HCT 27.7*  MCV 89.6  PLT 248   Cardiac Enzymes: No results for input(s): CKTOTAL,  CKMB, CKMBINDEX, TROPONINI in the last 168 hours.  BNP (last 3 results)  Recent Labs  07/27/16 1029 10/12/16 2302 11/01/16 1540  BNP 516.1* 313.4* 2,941.5*    ProBNP (last 3 results) No results for input(s): PROBNP in the last 8760 hours.  CBG:  Recent Labs Lab 11/17/16 1200  GLUCAP 197*    Radiological Exams on Admission: Ct Head Wo Contrast  Result Date: 11/17/2016 CLINICAL DATA:  Dizziness, confusion EXAM: CT HEAD WITHOUT CONTRAST TECHNIQUE: Contiguous axial images were obtained from the base of the skull through the vertex without intravenous contrast. COMPARISON:  10/13/2016 FINDINGS: Brain: There is atrophy and chronic small vessel disease changes. No acute intracranial abnormality. Specifically, no hemorrhage, hydrocephalus, mass lesion, acute infarction, or significant intracranial injury. Vascular: No hyperdense vessel or unexpected calcification. Skull: No acute calvarial abnormality. Sinuses/Orbits: Visualized paranasal sinuses and mastoids clear. Orbital soft tissues unremarkable. Other: None IMPRESSION: No acute intracranial abnormality. Atrophy, chronic microvascular disease. Electronically Signed   By: Rolm Baptise M.D.   On: 11/17/2016 15:59   Ct Angio Chest Pe W/cm &/or Wo Cm  Result Date: 11/17/2016 CLINICAL DATA:  Shortness of Breath since yesterday. EXAM: CT ANGIOGRAPHY CHEST WITH CONTRAST TECHNIQUE: Multidetector CT imaging of the chest was performed using the standard protocol during bolus administration of intravenous contrast. Multiplanar CT image reconstructions and MIPs were obtained to evaluate the vascular anatomy. CONTRAST:  100 cc Isovue 370 COMPARISON:  10/13/2016 FINDINGS: Cardiovascular: The heart is enlarged. No pericardial effusion. Coronary artery calcification is noted. Atherosclerotic calcification is noted in the wall of the thoracic aorta. No filling defect in the opacified pulmonary arteries to suggest the presence of an acute pulmonary  embolus. Right PICC line tip is positioned in the distal SVC. Mediastinum/Nodes: Upper normal paratracheal lymph nodes are evident an 11 mm subcarinal short axis lymph node is upper normal to borderline increased. No hilar lymphadenopathy. Fluid in the esophagus may be related to dysmotility or reflux. Lungs/Pleura: Interlobular septal thickening with diffuse ground-glass attenuation is noted bilaterally. Small bilateral pleural effusions are associated with compressive atelectasis in the lower lobes bilaterally.  Scattered areas of ground-glass nodularity likely related to the more diffuse underlying airspace disease. Upper Abdomen: Unremarkable. Musculoskeletal: Bone windows reveal no worrisome lytic or sclerotic osseous lesions. Degenerative changes are seen in the shoulders bilaterally. Review of the MIP images confirms the above findings. IMPRESSION: 1. No CT evidence for acute pulmonary embolus. 2. Diffuse interlobular and ground-glass airspace opacity suggests pulmonary edema. 3. Bibasilar dependent atelectasis with small bilateral pleural effusions. Electronically Signed   By: Misty Stanley M.D.   On: 11/17/2016 16:18    EKG: Independently reviewed. Sinus rhythm PR 0 point 16 QRS axis 90 no ST-T wave changes some PVCs noted no significant depressions compared to prior  Assessment/Plan Active Problems:   * No active hospital problems. *  Acute hypoxic respiratory failure probably secondary to decompensated Systolic heart failure CT scan of the chest is negative for pulmonary embolus ABG shows PaO2 of 51 and some respiratory alkalosis Looking through the discharge notes and the medication record from [facility] skilled facility it does not appear that patient has been on continuous Lasix and it is unclear what happened Patient states herself that she was getting Lasix while she was getting the antibiotics through the 19th Patient will receive Lasix at 40 IV twice a day for now and we will  reassess I feel she is stable to go to telemetry but she will need supplementary oxygen 2 L nasal cannula Will require inpatient monitoring  Recent complicated admissions with infected cervical hardware status post Unasyn therapy through 11/07/2016 -Her right-sided PICC line/central line should be removed prior to discharge back to facility -We will discontinue the same with IV nurse on discharge No evidence currently of infectious etiology  Diabetes mellitus poorly controlled recent initiation of insulin 12 units by Dr. Linus Orn Continue Lantus 12 units as above Sliding scale coverage requested  Chronic kidney disease stage III Monitor labs in the morning  ? Cardiac amyloid Nonobstructive CAD per cath 03/2016 Recent non-ST MI with SVT in 11 2070 Monitor as outpatient Resume a metoprolol 25 twice a day Avapro 7.5 daily  Recent PEG tube placement Continue meds via PEG tube Feeds at usual rate Decrease water flushes from 100-50 cc per 8 hours for right now Continue Protonix  DO NOT RESUSCITATE Discussed with patient alone inpatient  Time spent: [place], [facility] Pager [phone]  If 7PM-7AM, please contact night-coverage [email or web address] Password [email or web address] 11/17/2016, 6:10 PM

## 2016-11-18 ENCOUNTER — Inpatient Hospital Stay (HOSPITAL_COMMUNITY): Payer: Medicare Other

## 2016-11-18 DIAGNOSIS — I5023 Acute on chronic systolic (congestive) heart failure: Secondary | ICD-10-CM

## 2016-11-18 LAB — COMPREHENSIVE METABOLIC PANEL
ALK PHOS: 108 U/L (ref 38–126)
ALT: 42 U/L (ref 14–54)
AST: 34 U/L (ref 15–41)
Albumin: 2 g/dL — ABNORMAL LOW (ref 3.5–5.0)
Anion gap: 7 (ref 5–15)
BUN: 35 mg/dL — AB (ref 6–20)
CALCIUM: 9.2 mg/dL (ref 8.9–10.3)
CHLORIDE: 105 mmol/L (ref 101–111)
CO2: 27 mmol/L (ref 22–32)
CREATININE: 1.06 mg/dL — AB (ref 0.44–1.00)
GFR calc non Af Amer: 50 mL/min — ABNORMAL LOW (ref >60)
GFR, EST AFRICAN AMERICAN: 58 mL/min — AB (ref >60)
Glucose, Bld: 181 mg/dL — ABNORMAL HIGH (ref 65–99)
Potassium: 4.1 mmol/L (ref 3.5–5.1)
SODIUM: 139 mmol/L (ref 135–145)
Total Bilirubin: 0.8 mg/dL (ref 0.3–1.2)
Total Protein: 6.4 g/dL — ABNORMAL LOW (ref 6.5–8.1)

## 2016-11-18 LAB — CBC
HCT: 26.2 % — ABNORMAL LOW (ref 36.0–46.0)
Hemoglobin: 8.4 g/dL — ABNORMAL LOW (ref 12.0–15.0)
MCH: 29.2 pg (ref 26.0–34.0)
MCHC: 32.1 g/dL (ref 30.0–36.0)
MCV: 91 fL (ref 78.0–100.0)
PLATELETS: 231 10*3/uL (ref 150–400)
RBC: 2.88 MIL/uL — AB (ref 3.87–5.11)
RDW: 18.5 % — AB (ref 11.5–15.5)
WBC: 4.4 10*3/uL (ref 4.0–10.5)

## 2016-11-18 LAB — PROCALCITONIN: Procalcitonin: <0.1 ng/mL

## 2016-11-18 LAB — PROTIME-INR
INR: 1.3
PROTHROMBIN TIME: 16.3 s — AB (ref 11.4–15.2)

## 2016-11-18 LAB — STREP PNEUMONIAE URINARY ANTIGEN: STREP PNEUMO URINARY ANTIGEN: NEGATIVE

## 2016-11-18 LAB — MRSA PCR SCREENING: MRSA BY PCR: NEGATIVE

## 2016-11-18 LAB — MAGNESIUM: MAGNESIUM: 2 mg/dL (ref 1.7–2.4)

## 2016-11-18 MED ORDER — NORTRIPTYLINE HCL 10 MG PO CAPS
10.0000 mg | ORAL_CAPSULE | Freq: Every day | ORAL | Status: DC
  Administered 2016-11-19 – 2016-11-22 (×5): 10 mg
  Filled 2016-11-18 (×6): qty 1

## 2016-11-18 MED ORDER — PANTOPRAZOLE SODIUM 40 MG PO PACK
40.0000 mg | PACK | Freq: Every day | ORAL | Status: DC
  Administered 2016-11-19 – 2016-11-23 (×5): 40 mg
  Filled 2016-11-18 (×6): qty 20

## 2016-11-18 MED ORDER — ASPIRIN 81 MG PO CHEW
81.0000 mg | CHEWABLE_TABLET | Freq: Every day | ORAL | Status: DC
  Administered 2016-11-18 – 2016-11-23 (×6): 81 mg
  Filled 2016-11-18 (×7): qty 1

## 2016-11-18 MED ORDER — SODIUM CHLORIDE 0.9% FLUSH
10.0000 mL | INTRAVENOUS | Status: DC | PRN
  Administered 2016-11-19 – 2016-11-23 (×3): 10 mL
  Filled 2016-11-18 (×3): qty 40

## 2016-11-18 MED ORDER — DEXTROSE 5 % IV SOLN
1.0000 g | INTRAVENOUS | Status: DC
  Administered 2016-11-18: 1 g via INTRAVENOUS
  Filled 2016-11-18 (×2): qty 10

## 2016-11-18 MED ORDER — OSMOLITE 1.5 CAL PO LIQD
1000.0000 mL | ORAL | Status: DC
  Administered 2016-11-18 – 2016-11-23 (×7): 1000 mL
  Filled 2016-11-18 (×8): qty 1000

## 2016-11-18 MED ORDER — PRO-STAT SUGAR FREE PO LIQD
30.0000 mL | Freq: Every day | ORAL | Status: DC
  Administered 2016-11-18 – 2016-11-23 (×6): 30 mL
  Filled 2016-11-18 (×6): qty 30

## 2016-11-18 MED ORDER — SODIUM CHLORIDE 0.9% FLUSH
10.0000 mL | INTRAVENOUS | Status: DC | PRN
  Administered 2016-11-18 – 2016-11-20 (×3): 10 mL
  Filled 2016-11-18 (×3): qty 40

## 2016-11-18 MED ORDER — JEVITY 1.2 CAL PO LIQD: 1000.0000 mL | ORAL | Status: DC

## 2016-11-18 MED ORDER — IRBESARTAN 75 MG PO TABS
37.5000 mg | ORAL_TABLET | Freq: Every day | ORAL | Status: DC
Start: 2016-11-19 — End: 2016-11-21
  Administered 2016-11-19: 37.5 mg
  Filled 2016-11-18 (×2): qty 1

## 2016-11-18 MED ORDER — OMEPRAZOLE 2 MG/ML ORAL SUSPENSION: 40.0000 mg | Freq: Every day | ORAL | Status: DC

## 2016-11-18 MED ORDER — SODIUM CHLORIDE 0.9% FLUSH
10.0000 mL | Freq: Two times a day (BID) | INTRAVENOUS | Status: DC
  Administered 2016-11-18 – 2016-11-22 (×4): 10 mL

## 2016-11-18 NOTE — Progress Notes (Addendum)
Initial Nutrition Assessment  DOCUMENTATION CODES:   Obesity unspecified  INTERVENTION:  Provide Osmolite 1.5 @ 50 ml/hr via PEG with 30 ml pro-stat once daily to provide 1900 kcal, 90 grams of protein, and 912 ml of water.    NUTRITION DIAGNOSIS:   Inadequate oral intake related to inability to eat as evidenced by NPO status.   GOAL:   Patient will meet greater than or equal to 90% of their needs   MONITOR:   TF tolerance, Skin, Labs, I & O's, Weight trends  REASON FOR ASSESSMENT:   Consult Enteral/tube feeding initiation and management  ASSESSMENT:   75 year old female, resident of [facility] with history of CHF, CKD II/III, and T2DM. 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 Sent over from the nursing facility 12/29 because of confusion and altered mental status  Per H&P pt was receiving Nutren 2.0 @ 40 ml/hr continuous PTA with 100 ml free water flush every 8 hours. Per chart, pt's weight has trended down recently; weight was ~ 180 lbs back in November.  Pt asleep at time of visit. No family at bedside. Pt currently receiving Osmolite 1.5@ 60 ml/hr with 100 ml FWF every 8 hours. This provides 2160 kcal and 90 grams of protein. Paged MD; verbal consult to manage TF.   Labs: low hemoglobin  Diet Order:  Diet NPO time specified  Skin:  Wound (see comment) (Stage II PI on sacrum)  Last BM:  12/30  Height:   Ht Readings from Last 1 Encounters:  11/17/16 5\' 6"  (1.676 m)    Weight:   Wt Readings from Last 1 Encounters:  11/18/16 190 lb 4.1 oz (86.3 kg)    Ideal Body Weight:  59.09 kg  BMI:  Body mass index is 30.71 kg/m.  Estimated Nutritional Needs:   Kcal:  1700-1900  Protein:  90-105 grams  Fluid:  2.1 L/day  EDUCATION NEEDS:   No education needs identified at this time  [place], CSP, LDN Inpatient Clinical Dietitian Pager:  [phone] After Hours Pager: [phone]

## 2016-11-18 NOTE — Progress Notes (Signed)
New Admission Note:  Arrival Method: By bed from the ED around 2000 Mental Orientation: Alert and oriented, however as the night has went on she has periods of confusion. She was seeing things and saying things that did not make much since, but then she was fine again.  Telemetry: Box 20, CCMD called Assessment: Completed Skin: Completed, refer to flowsheets Iv: R upper PICC S.L. Pain: Denies Tubes: Peg tube Safety Measures: Safety Fall Prevention Plan was given, discussed  Admission: Completed [address] [place] Orientation: Patient has been orientated to the room, unit and the staff. Family: None at bedside  Orders have been reviewed and implemented. Will continue to monitor the patient. Call light has been placed within reach and bed alarm has been activated.   Perry Mount, RN  Phone Number: [phone]

## 2016-11-18 NOTE — Progress Notes (Signed)
[facility] Progress Note  Patient: Mandy Rhodes [id number]   PCP: Vidal Schwalbe, MD DOB: 05/03/1941   DOA: 11/17/2016   DOS: 11/18/2016   Date of Service: the patient was seen and examined on 11/18/2016  Brief hospital course: Pt. with PMH of CHF, security, type II DM, infected cervical hardware S/P PEG tube placement; admitted on 11/17/2016, with complaint of confusion, was found to have acute hypoxic arthritic failure as well as possible UTI. Currently further plan is IV antibiotics and IV Lasix.  Assessment and Plan: Acute hypoxic respiratory failure Acute on chronic systolic CHF.  CT scan of the chest is negative for pulmonary embolus ABG shows PaO2 of 51 and some respiratory alkalosis Looking through the discharge notes and the medication record from [facility] skilled facility it does not appear that patient has been on continuous Lasix and it is unclear what happened Patient states herself that she was getting Lasix while she was getting the antibiotics through the 19th Patient will receive Lasix at 40 IV twice a day for now and we will reassess  Recent complicated admissions with infected cervical hardware status post Unasyn therapy through 11/07/2016 -Her right-sided PICC line/central line should be removed prior to discharge back to facility  Possible UTI. Initiate ceftriaxone. Follow cultures  Diabetes mellitus poorly controlled recent initiation of insulin. Continue Lantus 12 units as above Sliding scale coverage requested  Chronic kidney disease stage III Currently stable. Monitor.  ? Cardiac amyloid Nonobstructive CAD per cath 03/2016 Recent non-ST MI with SVT in 11 2070 Monitor as outpatient Resume a metoprolol 25 twice a day Avapro 7.5 daily  Recent PEG tube placement Continue meds via PEG tube Feeds at usual rate Decrease water flushes from 100-50 cc per 8 hours for right now Continue Protonix  Bowel regimen: last BM  11/18/2016 Diet: Nothing by mouth, via PEG tube on tube feeding DVT Prophylaxis: subcutaneous Heparin  Advance goals of care discussion: DNR/DNI  Family Communication: no family was present at bedside, at the time of interview.    Disposition:  Discharge to home. Expected discharge date: 11/21/2016 Consultants: none Procedures: none  Antibiotics: Anti-infectives    None        Subjective: Feeling better, continues to remain hypoxic. No chest pain.  Objective: Physical Exam: Vitals:   11/17/16 2334 11/18/16 0138 11/18/16 0651 11/18/16 1000  BP: (!) 141/101  (!) 165/85 (!) 160/85  Pulse: (!) 54  62 (!) 59  Resp: 20  19 18   Temp: 98 F (36.7 C)  98.2 F (36.8 C) 98.6 F (37 C)  TempSrc: Axillary  Oral Oral  SpO2: 96%  98% 98%  Weight:  86.3 kg (190 lb 4.1 oz)    Height:        Intake/Output Summary (Last 24 hours) at 11/18/16 1818 Last data filed at 11/18/16 1734  Gross per 24 hour  Intake           529.33 ml  Output              250 ml  Net           279.33 ml   Filed Weights   11/17/16 1219 11/17/16 2037 11/18/16 0138  Weight: 88.5 kg (195 lb) 86.3 kg (190 lb 4.1 oz) 86.3 kg (190 lb 4.1 oz)    General: Alert, Awake and Oriented to Time, Place and Person. Appear in mild distress, affect appropriate Eyes: PERRL, Conjunctiva normal ENT: Oral Mucosa clear moist. Neck: difficult to assess JVD, no  Abnormal Mass Or lumps Cardiovascular: S1 and S2 Present, no Murmur, Respiratory: Bilateral Air entry equal and Decreased, no use of accessory muscle, Clear to Auscultation, no Crackles, no wheezes Abdomen: Bowel Sound present, Soft and no tenderness Skin: no redness, no Rash, no induration Extremities: bilateral Pedal edema, no calf tenderness Neurologic: Grossly no focal neuro deficit. Bilaterally Equal motor strength  Data Reviewed: CBC:  Recent Labs Lab 11/17/16 1330 11/18/16 0505  WBC 6.8 4.4  NEUTROABS 5.0  --   HGB 9.2* 8.4*  HCT 27.7* 26.2*  MCV  89.6 91.0  PLT 248 [id number]   Basic Metabolic Panel:  Recent Labs Lab 11/17/16 1330 11/18/16 0009 11/18/16 0505  NA 137  --  139  K 4.2  --  4.1  CL 104  --  105  CO2 25  --  27  GLUCOSE 179*  --  181*  BUN 34*  --  35*  CREATININE 1.07*  --  1.06*  CALCIUM 9.7  --  9.2  MG  --  2.0  --     Liver Function Tests:  Recent Labs Lab 11/18/16 0505  AST 34  ALT 42  ALKPHOS 108  BILITOT 0.8  PROT 6.4*  ALBUMIN 2.0*   No results for input(s): LIPASE, AMYLASE in the last 168 hours. No results for input(s): AMMONIA in the last 168 hours. Coagulation Profile:  Recent Labs Lab 11/18/16 0505  INR 1.30   Cardiac Enzymes: No results for input(s): CKTOTAL, CKMB, CKMBINDEX, TROPONINI in the last 168 hours. BNP (last 3 results) No results for input(s): PROBNP in the last 8760 hours.  CBG:  Recent Labs Lab 11/17/16 1200 11/17/16 1919 11/17/16 2015  GLUCAP 197* 136* 139*    Studies: X-ray Chest Pa And Lateral  Result Date: 11/18/2016 CLINICAL DATA:  Productive cough and congestion for 2 weeks EXAM: CHEST  2 VIEW COMPARISON:  11/05/2016 FINDINGS: Cardiomegaly. Right PICC line remains in place, unchanged. There is vascular congestion. Left lower lobe atelectasis or infiltrate. Small bilateral pleural effusions. IMPRESSION: Cardiomegaly with vascular congestion. Small bilateral effusions. Left lower lobe atelectasis or pneumonia. Electronically Signed   By: Rolm Baptise M.D.   On: 11/18/2016 09:45     Scheduled Meds: . aspirin  81 mg Per Tube Daily  . enoxaparin (LOVENOX) injection  40 mg Subcutaneous Q24H  . feeding supplement (PRO-STAT SUGAR FREE 64)  30 mL Per Tube Daily  . free water  100 mL Per Tube Q8H  . furosemide  40 mg Intravenous BID  . insulin glargine  12 Units Subcutaneous QHS  . [START ON 11/19/2016] irbesartan  37.5 mg Per Tube Daily  . metoprolol tartrate  25 mg Per Tube BID  . nortriptyline  10 mg Per Tube QHS  . [START ON 11/19/2016] pantoprazole  sodium  40 mg Per Tube Daily  . sodium chloride flush  10-40 mL Intracatheter Q12H  . sodium chloride flush  3 mL Intravenous Q12H   Continuous Infusions: . feeding supplement (OSMOLITE 1.5 CAL)     PRN Meds: sodium chloride flush, sodium chloride flush  Time spent: 30 minutes  Author: Berle Mull, MD [facility] Pager: [phone] 11/18/2016 6:18 PM  If 7PM-7AM, please contact night-coverage at [email or web address], password [facility]

## 2016-11-19 ENCOUNTER — Inpatient Hospital Stay (HOSPITAL_COMMUNITY): Payer: Medicare Other

## 2016-11-19 DIAGNOSIS — N390 Urinary tract infection, site not specified: Secondary | ICD-10-CM

## 2016-11-19 DIAGNOSIS — M79609 Pain in unspecified limb: Secondary | ICD-10-CM

## 2016-11-19 DIAGNOSIS — R609 Edema, unspecified: Secondary | ICD-10-CM

## 2016-11-19 DIAGNOSIS — I509 Heart failure, unspecified: Secondary | ICD-10-CM

## 2016-11-19 LAB — COMPREHENSIVE METABOLIC PANEL
ALT: 41 U/L (ref 14–54)
ANION GAP: 7 (ref 5–15)
AST: 28 U/L (ref 15–41)
Albumin: 1.9 g/dL — ABNORMAL LOW (ref 3.5–5.0)
Alkaline Phosphatase: 101 U/L (ref 38–126)
BILIRUBIN TOTAL: 0.4 mg/dL (ref 0.3–1.2)
BUN: 41 mg/dL — ABNORMAL HIGH (ref 6–20)
CHLORIDE: 104 mmol/L (ref 101–111)
CO2: 29 mmol/L (ref 22–32)
Calcium: 9 mg/dL (ref 8.9–10.3)
Creatinine, Ser: 1.24 mg/dL — ABNORMAL HIGH (ref 0.44–1.00)
GFR calc Af Amer: 48 mL/min — ABNORMAL LOW (ref >60)
GFR, EST NON AFRICAN AMERICAN: 41 mL/min — AB (ref >60)
Glucose, Bld: 255 mg/dL — ABNORMAL HIGH (ref 65–99)
POTASSIUM: 4.2 mmol/L (ref 3.5–5.1)
Sodium: 140 mmol/L (ref 135–145)
TOTAL PROTEIN: 6 g/dL — AB (ref 6.5–8.1)

## 2016-11-19 LAB — URINE CULTURE: Culture: >=100000 — AB

## 2016-11-19 LAB — CBC WITH DIFFERENTIAL/PLATELET
BASOS ABS: 0 10*3/uL (ref 0.0–0.1)
Basophils Relative: 1 %
Eosinophils Absolute: 0.3 10*3/uL (ref 0.0–0.7)
Eosinophils Relative: 6 %
HEMATOCRIT: 25.7 % — AB (ref 36.0–46.0)
HEMOGLOBIN: 8.4 g/dL — AB (ref 12.0–15.0)
LYMPHS ABS: 0.9 10*3/uL (ref 0.7–4.0)
LYMPHS PCT: 20 %
MCH: 30 pg (ref 26.0–34.0)
MCHC: 32.7 g/dL (ref 30.0–36.0)
MCV: 91.8 fL (ref 78.0–100.0)
Monocytes Absolute: 0.4 10*3/uL (ref 0.1–1.0)
Monocytes Relative: 8 %
NEUTROS ABS: 2.9 10*3/uL (ref 1.7–7.7)
NEUTROS PCT: 65 %
PLATELETS: 230 10*3/uL (ref 150–400)
RBC: 2.8 MIL/uL — AB (ref 3.87–5.11)
RDW: 18.5 % — ABNORMAL HIGH (ref 11.5–15.5)
WBC: 4.5 10*3/uL (ref 4.0–10.5)

## 2016-11-19 LAB — GLUCOSE, CAPILLARY
GLUCOSE-CAPILLARY: 134 mg/dL — AB (ref 65–99)
Glucose-Capillary: 114 mg/dL — ABNORMAL HIGH (ref 65–99)
Glucose-Capillary: 170 mg/dL — ABNORMAL HIGH (ref 65–99)
Glucose-Capillary: 218 mg/dL — ABNORMAL HIGH (ref 65–99)

## 2016-11-19 LAB — MAGNESIUM: Magnesium: 1.8 mg/dL (ref 1.7–2.4)

## 2016-11-19 MED ORDER — SODIUM CHLORIDE 0.9 % IV SOLN
1.0000 g | Freq: Two times a day (BID) | INTRAVENOUS | Status: DC
  Administered 2016-11-19 – 2016-11-21 (×4): 1 g via INTRAVENOUS
  Filled 2016-11-19 (×5): qty 1

## 2016-11-19 NOTE — Progress Notes (Signed)
Pharmacy Antibiotic Note  Mandy Rhodes is a 75 y.o. female  With ESBL UTI on rocephin.  Pharmacy has been consulted to switch to meropenem. -WBC= 4.5, SCr= 1.24, CrCl ~ 43  Plan: -meropenem 1gm IV q12h -Will follow renal function and clinical progress  Height: 5\' 6"  (167.6 cm) Weight: 192 lb 14.4 oz (87.5 kg) IBW/kg (Calculated) : 59.3  Temp (24hrs), Avg:97.9 F (36.6 C), Min:97.4 F (36.3 C), Max:98.5 F (36.9 C)   Recent Labs Lab 11/17/16 1330 11/18/16 0505 11/19/16 0437  WBC 6.8 4.4 4.5  CREATININE 1.07* 1.06* 1.24*    Estimated Creatinine Clearance: 43.7 mL/min (by C-G formula based on SCr of 1.24 mg/dL (H)).    Allergies  Allergen Reactions  . Sulfa Antibiotics Swelling    Antimicrobials this admission: 12/31 meropenem 12/30>12/31: ceftriaxone  Dose adjustments this admission:   Microbiology results: 12/29 urine- E coli. sens to imipenem  Thank you for allowing pharmacy to be a part of this patient's care.  Hildred Laser, Pharm D 11/19/2016 1:44 PM

## 2016-11-19 NOTE — Progress Notes (Signed)
VASCULAR LAB PRELIMINARY  PRELIMINARY  PRELIMINARY  PRELIMINARY  Bilateral lower extremity venous duplex completed.    Preliminary report:  Bilateral:  No evidence of DVT or superficial thrombosis. No evidence of a right Baker's cyst. There is a small area of mixed echoes in the left popliteal fossa. Cannot rule out a small Baker's cyst.   Bristal Steffy, RVS 11/19/2016, 11:17 AM

## 2016-11-19 NOTE — Progress Notes (Signed)
PROGRESS NOTE    Mandy Rhodes  [id number] DOB: 13-Jun-1941 DOA: 11/17/2016 PCP: Vidal Schwalbe, MD    Brief Narrative:  Pt. with PMH of CHF, security, type II DM, infected cervical hardware S/P PEG tube placement; admitted on 11/17/2016, with complaint of confusion, was found to have acute hypoxic arthritic failure as well as possible UTI. Currently further plan is IV antibiotics and IV Lasix.   Assessment & Plan:   Active Problems:   Hypoxemia   Acute hypoxic respiratory failure, Acute on chronic systolic CHF. CT scan of the chest is negative for pulmonary embolus ABG shows PaO2 of 51 and some respiratory alkalosis Looking through the discharge notes and the medication record from [facility] skilled facility it does not appear that patient has been on continuous Lasix and it is unclear what happened Patient states herself that she was getting Lasix while she was getting the antibiotics through the 19th Patient will receive Lasix at 40 IV twice a day for now and we will reassess Repeat BMP in am  Recent complicated admissions with infected cervical hardware status post Unasyn therapy through 11/07/2016 -Her right-sided PICC line/central line should be removed prior to discharge back to facility  Possible UTI. >100k colonies of E.coli Sensitivities show not sensitive to Ceftriaxone Start Meropenem   Diabetes mellitus poorly controlled recent initiation of insulin. Continue Lantus 12 units as above Sliding scale coverage requested  Chronic kidney disease stage III Currently stable. Monitor.  ?Cardiac amyloid Nonobstructive CAD per cath 03/2016 Recent non-ST MI with SVT in 11 2070 Monitor as outpatient Resume a metoprolol 25 twice a day Avapro 7.5 daily  Recent PEG tube placement Continue meds via PEG tube Feeds at usual rate Decrease water flushes from 100-50 cc per 8 hours for right now Continue Protonix  Diet: Nothing by mouth, via PEG tube on  tube feeding DVT Prophylaxis: subcutaneous Heparin Code Status: DNR/DNI Family Communication: no family was present at bedside, at the time of interview Disposition Plan: Plan to either discharge back to SNF or home   Consultants:   none  Procedures:   none  Antimicrobials:   Ceftriaxone 12/29>12/31  Meropenem 12/31>   Subjective: Patient watching television at time of exam.  She is asking if she is back on her lasix.  She also is asking if she could get a copy of medication list at time of discharge.  No pain noted.  No other concerns.  Objective: Vitals:   11/18/16 1900 11/18/16 2121 11/19/16 0548 11/19/16 1002  BP: (!) 145/80 (!) 114/56 (!) 155/70 99/60  Pulse: 65 75 73 73  Resp: 18 18 18 16   Temp: 98.5 F (36.9 C) 97.4 F (36.3 C) 97.6 F (36.4 C) 98.2 F (36.8 C)  TempSrc: Oral Oral Oral Oral  SpO2: 98% 97% 98% 97%  Weight:  87.5 kg (192 lb 14.4 oz)    Height:        Intake/Output Summary (Last 24 hours) at 11/19/16 1320 Last data filed at 11/19/16 1206  Gross per 24 hour  Intake              430 ml  Output              300 ml  Net              130 ml   Filed Weights   11/17/16 2037 11/18/16 0138 11/18/16 2121  Weight: 86.3 kg (190 lb 4.1 oz) 86.3 kg (190 lb 4.1 oz) 87.5 kg (192  lb 14.4 oz)    Examination:  General exam: Appears calm and comfortable  Respiratory system: Clear to auscultation. Respiratory effort normal. Cardiovascular system: S1 & S2 heard, RRR. No JVD, murmurs, rubs, gallops or clicks. No pedal edema. Gastrointestinal system: Abdomen is nondistended, soft and nontender. No organomegaly or masses felt. Normal bowel sounds heard. Central nervous system: Alert and oriented. No focal neurological deficits. Extremities: Symmetric 5 x 5 power. Skin: No rashes, lesions or ulcers Psychiatry: Judgement and insight appear normal. Mood & affect appropriate.     Data Reviewed: I have personally reviewed following labs and imaging  studies  CBC:  Recent Labs Lab 11/17/16 1330 11/18/16 0505 11/19/16 0437  WBC 6.8 4.4 4.5  NEUTROABS 5.0  --  2.9  HGB 9.2* 8.4* 8.4*  HCT 27.7* 26.2* 25.7*  MCV 89.6 91.0 91.8  PLT 248 231 [id number]   Basic Metabolic Panel:  Recent Labs Lab 11/17/16 1330 11/18/16 0009 11/18/16 0505 11/19/16 0437  NA 137  --  139 140  K 4.2  --  4.1 4.2  CL 104  --  105 104  CO2 25  --  27 29  GLUCOSE 179*  --  181* 255*  BUN 34*  --  35* 41*  CREATININE 1.07*  --  1.06* 1.24*  CALCIUM 9.7  --  9.2 9.0  MG  --  2.0  --  1.8   GFR: Estimated Creatinine Clearance: 43.7 mL/min (by C-G formula based on SCr of 1.24 mg/dL (H)). Liver Function Tests:  Recent Labs Lab 11/18/16 0505 11/19/16 0437  AST 34 28  ALT 42 41  ALKPHOS 108 101  BILITOT 0.8 0.4  PROT 6.4* 6.0*  ALBUMIN 2.0* 1.9*   No results for input(s): LIPASE, AMYLASE in the last 168 hours. No results for input(s): AMMONIA in the last 168 hours. Coagulation Profile:  Recent Labs Lab 11/18/16 0505  INR 1.30   Cardiac Enzymes: No results for input(s): CKTOTAL, CKMB, CKMBINDEX, TROPONINI in the last 168 hours. BNP (last 3 results) No results for input(s): PROBNP in the last 8760 hours. HbA1C: No results for input(s): HGBA1C in the last 72 hours. CBG:  Recent Labs Lab 11/17/16 1200 11/17/16 1919 11/17/16 2015 11/19/16 0810  GLUCAP 197* 136* 139* 218*   Lipid Profile: No results for input(s): CHOL, HDL, LDLCALC, TRIG, CHOLHDL, LDLDIRECT in the last 72 hours. Thyroid Function Tests: No results for input(s): TSH, T4TOTAL, FREET4, T3FREE, THYROIDAB in the last 72 hours. Anemia Panel: No results for input(s): VITAMINB12, FOLATE, FERRITIN, TIBC, IRON, RETICCTPCT in the last 72 hours. Sepsis Labs:  Recent Labs Lab 11/18/16 1407  PROCALCITON <0.10    Recent Results (from the past 240 hour(s))  Urine culture     Status: Abnormal   Collection Time: 11/17/16  3:03 PM  Result Value Ref Range Status   Specimen  Description URINE, CLEAN CATCH  Final   Special Requests NONE  Final   Culture (A)  Final    >=100,000 COLONIES/mL ESCHERICHIA COLI Confirmed Extended Spectrum Beta-Lactamase Producer (ESBL)    Report Status 11/19/2016 FINAL  Final   Organism ID, Bacteria ESCHERICHIA COLI (A)  Final      Susceptibility   Escherichia coli - MIC*    AMPICILLIN >=32 RESISTANT Resistant     CEFAZOLIN >=64 RESISTANT Resistant     CEFTRIAXONE >=64 RESISTANT Resistant     CIPROFLOXACIN >=4 RESISTANT Resistant     GENTAMICIN <=1 SENSITIVE Sensitive     IMIPENEM <=0.25 SENSITIVE Sensitive  NITROFURANTOIN <=16 SENSITIVE Sensitive     TRIMETH/SULFA <=20 SENSITIVE Sensitive     AMPICILLIN/SULBACTAM >=32 RESISTANT Resistant     PIP/TAZO 32 INTERMEDIATE Intermediate     Extended ESBL POSITIVE Resistant     * >=100,000 COLONIES/mL ESCHERICHIA COLI  MRSA PCR Screening     Status: None   Collection Time: 11/18/16  1:44 AM  Result Value Ref Range Status   MRSA by PCR NEGATIVE NEGATIVE Final    Comment:        The GeneXpert MRSA Assay (FDA approved for NASAL specimens only), is one component of a comprehensive MRSA colonization surveillance program. It is not intended to diagnose MRSA infection nor to guide or monitor treatment for MRSA infections.          Radiology Studies: X-ray Chest Pa And Lateral  Result Date: 11/18/2016 CLINICAL DATA:  Productive cough and congestion for 2 weeks EXAM: CHEST  2 VIEW COMPARISON:  11/05/2016 FINDINGS: Cardiomegaly. Right PICC line remains in place, unchanged. There is vascular congestion. Left lower lobe atelectasis or infiltrate. Small bilateral pleural effusions. IMPRESSION: Cardiomegaly with vascular congestion. Small bilateral effusions. Left lower lobe atelectasis or pneumonia. Electronically Signed   By: Rolm Baptise M.D.   On: 11/18/2016 09:45   Ct Head Wo Contrast  Result Date: 11/17/2016 CLINICAL DATA:  Dizziness, confusion EXAM: CT HEAD WITHOUT  CONTRAST TECHNIQUE: Contiguous axial images were obtained from the base of the skull through the vertex without intravenous contrast. COMPARISON:  10/13/2016 FINDINGS: Brain: There is atrophy and chronic small vessel disease changes. No acute intracranial abnormality. Specifically, no hemorrhage, hydrocephalus, mass lesion, acute infarction, or significant intracranial injury. Vascular: No hyperdense vessel or unexpected calcification. Skull: No acute calvarial abnormality. Sinuses/Orbits: Visualized paranasal sinuses and mastoids clear. Orbital soft tissues unremarkable. Other: None IMPRESSION: No acute intracranial abnormality. Atrophy, chronic microvascular disease. Electronically Signed   By: Rolm Baptise M.D.   On: 11/17/2016 15:59   Ct Angio Chest Pe W/cm &/or Wo Cm  Result Date: 11/17/2016 CLINICAL DATA:  Shortness of Breath since yesterday. EXAM: CT ANGIOGRAPHY CHEST WITH CONTRAST TECHNIQUE: Multidetector CT imaging of the chest was performed using the standard protocol during bolus administration of intravenous contrast. Multiplanar CT image reconstructions and MIPs were obtained to evaluate the vascular anatomy. CONTRAST:  100 cc Isovue 370 COMPARISON:  10/13/2016 FINDINGS: Cardiovascular: The heart is enlarged. No pericardial effusion. Coronary artery calcification is noted. Atherosclerotic calcification is noted in the wall of the thoracic aorta. No filling defect in the opacified pulmonary arteries to suggest the presence of an acute pulmonary embolus. Right PICC line tip is positioned in the distal SVC. Mediastinum/Nodes: Upper normal paratracheal lymph nodes are evident an 11 mm subcarinal short axis lymph node is upper normal to borderline increased. No hilar lymphadenopathy. Fluid in the esophagus may be related to dysmotility or reflux. Lungs/Pleura: Interlobular septal thickening with diffuse ground-glass attenuation is noted bilaterally. Small bilateral pleural effusions are associated with  compressive atelectasis in the lower lobes bilaterally. Scattered areas of ground-glass nodularity likely related to the more diffuse underlying airspace disease. Upper Abdomen: Unremarkable. Musculoskeletal: Bone windows reveal no worrisome lytic or sclerotic osseous lesions. Degenerative changes are seen in the shoulders bilaterally. Review of the MIP images confirms the above findings. IMPRESSION: 1. No CT evidence for acute pulmonary embolus. 2. Diffuse interlobular and ground-glass airspace opacity suggests pulmonary edema. 3. Bibasilar dependent atelectasis with small bilateral pleural effusions. Electronically Signed   By: Misty Stanley M.D.   On: 11/17/2016  16:18        Scheduled Meds: . aspirin  81 mg Per Tube Daily  . cefTRIAXone (ROCEPHIN)  IV  1 g Intravenous Q24H  . enoxaparin (LOVENOX) injection  40 mg Subcutaneous Q24H  . feeding supplement (PRO-STAT SUGAR FREE 64)  30 mL Per Tube Daily  . free water  100 mL Per Tube Q8H  . furosemide  40 mg Intravenous BID  . insulin glargine  12 Units Subcutaneous QHS  . irbesartan  37.5 mg Per Tube Daily  . metoprolol tartrate  25 mg Per Tube BID  . nortriptyline  10 mg Per Tube QHS  . pantoprazole sodium  40 mg Per Tube Daily  . sodium chloride flush  10-40 mL Intracatheter Q12H  . sodium chloride flush  3 mL Intravenous Q12H   Continuous Infusions: . feeding supplement (OSMOLITE 1.5 CAL) 1,000 mL (11/19/16 0028)     LOS: 2 days    Time spent: 35 minutes    Loretha Stapler, MD [facility] Pager [phone]  If 7PM-7AM, please contact night-coverage [email or web address] Password [email or web address] 11/19/2016, 1:20 PM

## 2016-11-20 LAB — GLUCOSE, CAPILLARY
GLUCOSE-CAPILLARY: 169 mg/dL — AB (ref 65–99)
Glucose-Capillary: 156 mg/dL — ABNORMAL HIGH (ref 65–99)
Glucose-Capillary: 166 mg/dL — ABNORMAL HIGH (ref 65–99)
Glucose-Capillary: 215 mg/dL — ABNORMAL HIGH (ref 65–99)
Glucose-Capillary: 207 mg/dL — ABNORMAL HIGH (ref 65–99)

## 2016-11-20 LAB — PROCALCITONIN

## 2016-11-20 MED ORDER — FUROSEMIDE 10 MG/ML IJ SOLN
20.0000 mg | Freq: Two times a day (BID) | INTRAMUSCULAR | Status: DC
  Administered 2016-11-20 (×2): 20 mg via INTRAVENOUS
  Filled 2016-11-20 (×2): qty 2

## 2016-11-20 NOTE — Progress Notes (Signed)
PROGRESS NOTE    Mandy Rhodes  [id number] DOB: December 04, 1940 DOA: 11/17/2016 PCP: Vidal Schwalbe, MD    Brief Narrative:  Pt. with PMH of CHF, security, type II DM, infected cervical hardware S/P PEG tube placement; admitted on 11/17/2016, with complaint of confusion, was found to have acute hypoxic arthritic failure as well as possible UTI. Currently further plan is IV antibiotics and IV Lasix.   Assessment & Plan:   Active Problems:   Hypoxemia   Acute hypoxic respiratory failure, Acute on chronic systolic CHF. CT scan of the chest is negative for pulmonary embolus ABG shows PaO2 of 51 and some respiratory alkalosis Looking through the discharge notes and the medication record from [facility] skilled facility it does not appear that patient has been on continuous Lasix and it is unclear what happened Patient states herself that she was getting Lasix while she was getting the antibiotics through the 19th Patient will receive Lasix at 20mg  IV twice a day for now and we will reassess Repeat BMP in am for creatinine Lasix dose decreased from 40mg  to 20mg  this am  Recent complicated admissions with infected cervical hardware status post Unasyn therapy through 11/07/2016 -Her right-sided PICC line/central line should be removed prior to discharge back to facility  Possible UTI. >100k colonies of ESBL Sensitivities show not sensitive to Ceftriaxone Meropenem started on 12/31  Diabetes mellitus poorly controlled recent initiation of insulin. Continue Lantus 12 units as above Sliding scale coverage requested  Chronic kidney disease stage III Currently stable. Monitor (especially since lasix restarted)  ?Cardiac amyloid Nonobstructive CAD per cath 03/2016 Recent non-ST MI with SVT in 09/2069 Monitor as outpatient Resume a metoprolol 25 twice a day Avapro 7.5 daily (this was held this am due to hypotension)  Recent PEG tube placement Continue meds via PEG  tube Feeds at usual rate Free water flushes 100cc per 8 hours Continue Protonix Nutrition consulted  Diet: Nothing by mouth, via PEG tube on tube feeding DVT Prophylaxis: subcutaneous Heparin Code Status: DNR/DNI Family Communication: no family was present at bedside, at the time of interview Disposition Plan: Plan to either discharge back to SNF   Consultants:   none  Procedures:   none  Antimicrobials:   Ceftriaxone 12/29>12/31  Meropenem 12/31>   Subjective: Patient laying in bed.  She says she feels well this morning.  Denies chest pain, chest pressure, palpitations, shortness of breath.  Asking appropriate questions about antibiotics.  Objective: Vitals:   11/19/16 1754 11/19/16 1952 11/20/16 0406 11/20/16 0918  BP: (!) 96/54 (!) 109/58 (!) 101/54 (!) 88/44  Pulse: 68 80  72  Resp: 16 18 16 18   Temp: 98.3 F (36.8 C) 97.3 F (36.3 C) 98.2 F (36.8 C) 98.1 F (36.7 C)  TempSrc: Oral   Oral  SpO2: 98% 100% 100% 100%  Weight:      Height:        Intake/Output Summary (Last 24 hours) at 11/20/16 1653 Last data filed at 11/20/16 1335  Gross per 24 hour  Intake              320 ml  Output              200 ml  Net              120 ml   Filed Weights   11/17/16 2037 11/18/16 0138 11/18/16 2121  Weight: 86.3 kg (190 lb 4.1 oz) 86.3 kg (190 lb 4.1 oz) 87.5 kg (192 lb 14.4  oz)    Examination:  General exam: Appears calm and comfortable  Respiratory system: Clear to auscultation. Respiratory effort normal. Cardiovascular system: S1 & S2 heard, RRR. No JVD, murmurs, rubs, gallops or clicks. No pedal edema. Gastrointestinal system: Abdomen is nondistended, soft and nontender. No organomegaly or masses felt. Normal bowel sounds heard. Central nervous system: Alert and oriented. No focal neurological deficits. Extremities: no cyanosis, clubbing or edema Skin: No rashes, lesions or ulcers Psychiatry: Judgement and insight appear normal- patient asking  appropriate questions. Mood & affect appropriate.     Data Reviewed: I have personally reviewed following labs and imaging studies  CBC:  Recent Labs Lab 11/17/16 1330 11/18/16 0505 11/19/16 0437  WBC 6.8 4.4 4.5  NEUTROABS 5.0  --  2.9  HGB 9.2* 8.4* 8.4*  HCT 27.7* 26.2* 25.7*  MCV 89.6 91.0 91.8  PLT 248 231 [id number]   Basic Metabolic Panel:  Recent Labs Lab 11/17/16 1330 11/18/16 0009 11/18/16 0505 11/19/16 0437  NA 137  --  139 140  K 4.2  --  4.1 4.2  CL 104  --  105 104  CO2 25  --  27 29  GLUCOSE 179*  --  181* 255*  BUN 34*  --  35* 41*  CREATININE 1.07*  --  1.06* 1.24*  CALCIUM 9.7  --  9.2 9.0  MG  --  2.0  --  1.8   GFR: Estimated Creatinine Clearance: 43.7 mL/min (by C-G formula based on SCr of 1.24 mg/dL (H)). Liver Function Tests:  Recent Labs Lab 11/18/16 0505 11/19/16 0437  AST 34 28  ALT 42 41  ALKPHOS 108 101  BILITOT 0.8 0.4  PROT 6.4* 6.0*  ALBUMIN 2.0* 1.9*   No results for input(s): LIPASE, AMYLASE in the last 168 hours. No results for input(s): AMMONIA in the last 168 hours. Coagulation Profile:  Recent Labs Lab 11/18/16 0505  INR 1.30   Cardiac Enzymes: No results for input(s): CKTOTAL, CKMB, CKMBINDEX, TROPONINI in the last 168 hours. BNP (last 3 results) No results for input(s): PROBNP in the last 8760 hours. HbA1C: No results for input(s): HGBA1C in the last 72 hours. CBG:  Recent Labs Lab 11/19/16 2354 11/20/16 0404 11/20/16 0738 11/20/16 1139 11/20/16 1551  GLUCAP 170* 156* 166* 169* 215*   Lipid Profile: No results for input(s): CHOL, HDL, LDLCALC, TRIG, CHOLHDL, LDLDIRECT in the last 72 hours. Thyroid Function Tests: No results for input(s): TSH, T4TOTAL, FREET4, T3FREE, THYROIDAB in the last 72 hours. Anemia Panel: No results for input(s): VITAMINB12, FOLATE, FERRITIN, TIBC, IRON, RETICCTPCT in the last 72 hours. Sepsis Labs:  Recent Labs Lab 11/18/16 1407 11/20/16 0447  PROCALCITON <0.10 <0.10     Recent Results (from the past 240 hour(s))  Urine culture     Status: Abnormal   Collection Time: 11/17/16  3:03 PM  Result Value Ref Range Status   Specimen Description URINE, CLEAN CATCH  Final   Special Requests NONE  Final   Culture (A)  Final    >=100,000 COLONIES/mL ESCHERICHIA COLI Confirmed Extended Spectrum Beta-Lactamase Producer (ESBL)    Report Status 11/19/2016 FINAL  Final   Organism ID, Bacteria ESCHERICHIA COLI (A)  Final      Susceptibility   Escherichia coli - MIC*    AMPICILLIN >=32 RESISTANT Resistant     CEFAZOLIN >=64 RESISTANT Resistant     CEFTRIAXONE >=64 RESISTANT Resistant     CIPROFLOXACIN >=4 RESISTANT Resistant     GENTAMICIN <=1 SENSITIVE Sensitive  IMIPENEM <=0.25 SENSITIVE Sensitive     NITROFURANTOIN <=16 SENSITIVE Sensitive     TRIMETH/SULFA <=20 SENSITIVE Sensitive     AMPICILLIN/SULBACTAM >=32 RESISTANT Resistant     PIP/TAZO 32 INTERMEDIATE Intermediate     Extended ESBL POSITIVE Resistant     * >=100,000 COLONIES/mL ESCHERICHIA COLI  MRSA PCR Screening     Status: None   Collection Time: 11/18/16  1:44 AM  Result Value Ref Range Status   MRSA by PCR NEGATIVE NEGATIVE Final    Comment:        The GeneXpert MRSA Assay (FDA approved for NASAL specimens only), is one component of a comprehensive MRSA colonization surveillance program. It is not intended to diagnose MRSA infection nor to guide or monitor treatment for MRSA infections.          Radiology Studies: No results found.      Scheduled Meds: . aspirin  81 mg Per Tube Daily  . enoxaparin (LOVENOX) injection  40 mg Subcutaneous Q24H  . feeding supplement (PRO-STAT SUGAR FREE 64)  30 mL Per Tube Daily  . free water  100 mL Per Tube Q8H  . furosemide  20 mg Intravenous BID  . insulin glargine  12 Units Subcutaneous QHS  . irbesartan  37.5 mg Per Tube Daily  . meropenem (MERREM) IV  1 g Intravenous Q12H  . metoprolol tartrate  25 mg Per Tube BID  .  nortriptyline  10 mg Per Tube QHS  . pantoprazole sodium  40 mg Per Tube Daily  . sodium chloride flush  10-40 mL Intracatheter Q12H  . sodium chloride flush  3 mL Intravenous Q12H   Continuous Infusions: . feeding supplement (OSMOLITE 1.5 CAL) 1,000 mL (11/20/16 0133)     LOS: 3 days    Time spent: 30 minutes    Loretha Stapler, MD [facility] Pager [phone]  If 7PM-7AM, please contact night-coverage [email or web address] Password [email or web address] 11/20/2016, 4:53 PM

## 2016-11-21 DIAGNOSIS — D631 Anemia in chronic kidney disease: Secondary | ICD-10-CM

## 2016-11-21 DIAGNOSIS — N183 Chronic kidney disease, stage 3 (moderate): Secondary | ICD-10-CM

## 2016-11-21 LAB — BASIC METABOLIC PANEL
Anion gap: 5 (ref 5–15)
BUN: 41 mg/dL — AB (ref 6–20)
CALCIUM: 9 mg/dL (ref 8.9–10.3)
CO2: 34 mmol/L — ABNORMAL HIGH (ref 22–32)
CREATININE: 1.34 mg/dL — AB (ref 0.44–1.00)
Chloride: 99 mmol/L — ABNORMAL LOW (ref 101–111)
GFR calc Af Amer: 44 mL/min — ABNORMAL LOW (ref >60)
GFR, EST NON AFRICAN AMERICAN: 38 mL/min — AB (ref >60)
Glucose, Bld: 275 mg/dL — ABNORMAL HIGH (ref 65–99)
Potassium: 4 mmol/L (ref 3.5–5.1)
SODIUM: 138 mmol/L (ref 135–145)

## 2016-11-21 LAB — EXPECTORATED SPUTUM ASSESSMENT W GRAM STAIN, RFLX TO RESP C

## 2016-11-21 LAB — GLUCOSE, CAPILLARY
GLUCOSE-CAPILLARY: 236 mg/dL — AB (ref 65–99)
GLUCOSE-CAPILLARY: 233 mg/dL — AB (ref 65–99)
Glucose-Capillary: 241 mg/dL — ABNORMAL HIGH (ref 65–99)
Glucose-Capillary: 257 mg/dL — ABNORMAL HIGH (ref 65–99)
Glucose-Capillary: 259 mg/dL — ABNORMAL HIGH (ref 65–99)

## 2016-11-21 LAB — MAGNESIUM: MAGNESIUM: 1.9 mg/dL (ref 1.7–2.4)

## 2016-11-21 LAB — EXPECTORATED SPUTUM ASSESSMENT W REFEX TO RESP CULTURE

## 2016-11-21 MED ORDER — FOSFOMYCIN TROMETHAMINE 3 G PO PACK
3.0000 g | PACK | ORAL | Status: DC
  Administered 2016-11-22: 3 g via ORAL
  Filled 2016-11-21 (×2): qty 3

## 2016-11-21 MED ORDER — INSULIN ASPART 100 UNIT/ML ~~LOC~~ SOLN
0.0000 [IU] | Freq: Every day | SUBCUTANEOUS | Status: DC
  Administered 2016-11-21: 2 [IU] via SUBCUTANEOUS

## 2016-11-21 MED ORDER — SODIUM CHLORIDE 0.9 % IV SOLN
1.0000 g | Freq: Two times a day (BID) | INTRAVENOUS | Status: AC
  Administered 2016-11-21: 1 g via INTRAVENOUS
  Filled 2016-11-21: qty 1

## 2016-11-21 MED ORDER — SODIUM CHLORIDE 0.9 % IV SOLN: 1.0000 g | Freq: Two times a day (BID) | INTRAVENOUS | Status: DC

## 2016-11-21 MED ORDER — INSULIN ASPART 100 UNIT/ML ~~LOC~~ SOLN
0.0000 [IU] | Freq: Three times a day (TID) | SUBCUTANEOUS | Status: DC
  Administered 2016-11-21 (×2): 3 [IU] via SUBCUTANEOUS
  Administered 2016-11-22: 2 [IU] via SUBCUTANEOUS
  Administered 2016-11-22 (×2): 5 [IU] via SUBCUTANEOUS
  Administered 2016-11-23 (×2): 3 [IU] via SUBCUTANEOUS

## 2016-11-21 NOTE — Care Management Important Message (Signed)
Important Message  Patient Details  Name: Mandy Rhodes MRN: [id number] Date of Birth: 11/23/1940   Medicare Important Message Given:  Yes    Tamie Minteer, Rory Percy, RN 11/21/2016, 1:22 PM

## 2016-11-21 NOTE — Consult Note (Addendum)
[place] Nurse wound consult note Reason for Consult: Consult requested for sacrum and buttocks.  Pt was noted to have these wounds during the previous admission and is frequently incontinent of stool and urine; and it is difficult to keep wound from becoming soiled related to the close proximity to the rectum. Wound type: Sacrum with chronic stage 3 pressure injury; 4X4X.2cmm dark reddish purple center, red dry wound bed surrounding to the edges.  No odor, drainage, or fluctuance. Buttocks with healing stage 3 wound which spans bilat inner buttocks; approx 4X2X,1cm pink dry wound bed, No odor, drainage, or fluctuance. Pressure Ulcer POA: Yes Dressing procedure/placement/frequency:  Air mattress to decrease pressure and foam dressing to protect from further injury.  Discussed plan of care with patient and she verbalized understanding. Please re-consult if further assistance is needed.  Thank-you,  Julien Girt MSN, [place], [place], [place], [place]

## 2016-11-21 NOTE — Progress Notes (Signed)
Occupational Therapy Evaluation Patient Details Name: Mandy Rhodes MRN: [id number] DOB: Oct 22, 1941 Today's Date: 11/21/2016    History of Present Illness Pt is a 76 y/o female admitted 11/01/16 for acute respiratory failure with hypoxemia from CHF exacerbation from [place]. Pt with SOB and weakness, sepsis due to esophageal abscess with perforation 10/21/16-10/28/16. Pt with NSTEMI, VDRF with ETT 11/23-12/1, ACDF hardware removal with I&D. PMHx: DM, CHF, CKD, HTN, anemia, R rotator cuff tear, spinal stenosis, neuropathy, hyperlipidemia.Current admission for SOB and hypoxia to as low as 82%.  +chest pressure across her chest.   Clinical Impression   PTA, pt was participating in rehab at [facility]. States she was able to stand with +2 assistance before this last admission. Pt will benefit from further rehab at [facility] after DC. Will follow acutely to maximize functional level of independence with ADL and mobility and facilitate safe DC to next venue of care.     Follow Up Recommendations  SNF;Supervision/Assistance - 24 hour    Equipment Recommendations  Other (comment) (TBD at next venue)    Recommendations for Other Services       Precautions / Restrictions Precautions Precautions: Fall;Cervical Restrictions Weight Bearing Restrictions: No      Mobility Bed Mobility Overal bed mobility: Needs Assistance Bed Mobility: Rolling;Sidelying to Sit Rolling: Mod assist (Rolling to R. able to pull with L hand on rail.)         General bed mobility comments: Declined EOB at this time as nsg just finished cleaning her up and complaining of her bottom being sore  Transfers         Stand pivot transfers:  (with Steady)       General transfer comment: States she stood at [facility]. Will attempt next visit with max A and use of stedy    Balance Overall balance assessment: Needs assistance                                          ADL Overall ADL's : Needs  assistance/impaired Eating/Feeding: NPO   Grooming: Moderate assistance Grooming Details (indicate cue type and reason): Pt able to brush teeth with L hand Upper Body Bathing: Moderate assistance Upper Body Bathing Details (indicate cue type and reason): discussed need for her to complete more of her self care at bed level Lower Body Bathing: Total assistance   Upper Body Dressing : Maximal assistance   Lower Body Dressing: Total assistance               Functional mobility during ADLs: +2 for physical assistance       Vision     Perception     Praxis      Pertinent Vitals/Pain Pain Assessment: Faces Faces Pain Scale: Hurts even more Pain Location: bottom Pain Descriptors / Indicators: Sore;Burning;Discomfort;Grimacing Pain Intervention(s): Limited activity within patient's tolerance;Repositioned     Hand Dominance Right (however has more use with L hand)   Extremity/Trunk Assessment Upper Extremity Assessment RUE Deficits / Details: Limited shoulder PROM - unable to lift RUE off bed due to apparent RTC deficit Strength-shoulder 2+/5, elbow 3+/5. 3+/5 grip strength.. Unable to actively lift R arm off bed. tolerates PROM to @90  degrees LUE Deficits / Details: shoulder AROM 0-90 FF. Strength-shoulder 3/5, elbow 3+/5. Poor grip strength.improved since last admission   Lower Extremity Assessment Lower Extremity Assessment: Defer to PT evaluation   Cervical /  Trunk Assessment Cervical / Trunk Assessment: Other exceptions   Communication Communication Communication: No difficulties   Cognition Arousal/Alertness: Awake/alert Behavior During Therapy: WFL for tasks assessed/performed;Flat affect Overall Cognitive Status: No family/caregiver present to determine baseline cognitive functioning                 General Comments: Pt able to give history of medical complications   General Comments       Exercises Exercises: General Upper Extremity      Shoulder Instructions      Home Living Family/patient expects to be discharged to:: Skilled nursing facility Living Arrangements: Alone Available Help at Discharge: Family;Available PRN/intermittently Type of Home: House Home Access: Stairs to enter [employer] of Steps: 3   Home Layout: One level     Bathroom Shower/Tub: Teacher, early years/pre: Standard     Home Equipment: Environmental consultant - 2 wheels;Shower seat;Bedside commode   Additional Comments: [facility]      Prior Functioning/Environment Level of Independence: Needs assistance  Gait / Transfers Assistance Needed: unable to ambulate during SNF stay. Patient reports staying in bed for most of stay in [place].  ADL's / Homemaking Assistance Needed: completely dependent on SNF staff for ADLs at bed level (bathing, dressing)   Comments: Patient now comes from [place], where patient reports being dependent. Prior to SNF, patient was using RW, BSC over toilet, shower chair. Patient was also able to do homemaking and driving.         OT Problem List: Decreased strength;Decreased range of motion;Decreased activity tolerance;Impaired balance (sitting and/or standing);Decreased knowledge of use of DME or AE;Obesity;Impaired UE functional use;Pain;Decreased coordination;Cardiopulmonary status limiting activity;Impaired sensation;Impaired tone   OT Treatment/Interventions: Self-care/ADL training;Therapeutic exercise;Energy conservation;DME and/or AE instruction;Therapeutic activities;Patient/family education;Balance training    OT Goals(Current goals can be found in the care plan section) Acute Rehab OT Goals Patient Stated Goal: return to independence OT Goal Formulation: With patient Time For Goal Achievement: 11/30/15 Potential to Achieve Goals: Good ADL Goals Pt Will Perform Grooming: with set-up;with supervision;bed level Pt Will Perform Upper Body Bathing: with set-up;with supervision;sitting;bed  level Pt Will Perform Lower Body Bathing: with mod assist;bed level Pt/caregiver will Perform Home Exercise Program: Increased ROM;Increased strength;Both right and left upper extremity;With minimal assist Additional ADL Goal #1: Complete sit - stand with mod A +2 with use of lift equpiment as needed in preparation for toilet tranfsers  OT Frequency: Min 2X/week   Barriers to D/C:            Co-evaluation              End of Session Equipment Utilized During Treatment: Oxygen  Activity Tolerance: Patient limited by fatigue Patient left: in bed;with call bell/phone within reach;with bed alarm set   Time: 1650-1705 OT Time Calculation (min): 15 min Charges:  OT General Charges $OT Visit: 1 Procedure OT Evaluation $OT Eval Moderate Complexity: 1 Procedure G-Codes:    Dorris Pierre,HILLARY 18-Dec-2016, 5:17 PM  [facility], OT/L  PD:[id number] 2016/12/18

## 2016-11-21 NOTE — Progress Notes (Signed)
PROGRESS NOTE    Mandy Rhodes  [id number] DOB: [birth year] DOA: 11/17/2016 PCP: Vidal Schwalbe, MD    Brief Narrative:  Pt. with PMH of CHF, security, type II DM, infected cervical hardware S/P PEG tube placement; admitted on 11/17/2016, with complaint of confusion, was found to have acute hypoxic arthritic failure as well as possible UTI. Currently further plan is IV antibiotics and Lasix.   Assessment & Plan:   Active Problems:   Hypoxemia   Acute hypoxic respiratory failure, Acute on chronic systolic CHF. CT scan of the chest is negative for pulmonary embolus ABG shows PaO2 of 51 and some respiratory alkalosis Looking through the discharge notes and the medication record from [facility] skilled facility it does not appear that patient has been on continuous Lasix and it is unclear what happened Patient states herself that she was getting Lasix while she was getting the antibiotics through the 19th Changed IV lasix to PO lasix 2/2 rising Cr and CO2 Lasix 20mg  PO daily  Recent complicated admissions with infected cervical hardware status post Unasyn therapy through 11/07/2016 -Her right-sided PICC line/central line should be removed prior to discharge back to facility  Possible UTI. >100k colonies of ESBL Sensitivities show not sensitive to Ceftriaxone Meropenem started on 12/31  Diabetes mellitus poorly controlled recent initiation of insulin. Continue Lantus 12 units as above Sliding scale coverage requested as well as mealtime insulin  Chronic kidney disease stage III Currently stable. Monitor (especially since lasix restarted)  ?Cardiac amyloid Nonobstructive CAD per cath 03/2016 Recent non-ST MI with SVT in 09/2069 Monitor as outpatient Resume a metoprolol 25 twice a day Can consider restarting ARB when BP elevated (previous BP in 0000000 systolic)  Recent PEG tube placement Continue meds via PEG tube Feeds at usual rate Free water flushes 100cc  per 8 hours Continue Protonix Nutrition consulted  Anemia Repeat CBC in am as well as anemia panel Looking through previous lab values this seems chronic Baseline Hgb ~8.0  Debility Consult PT/OT  Patient weak from previous hospitalizations  Diet: Nothing by mouth, via PEG tube on tube feeding DVT Prophylaxis: subcutaneous Heparin Code Status: DNR/DNI Family Communication: no family was present at bedside, at the time of interview Disposition Plan: Plan to d/c back to SNF   Consultants:   PT/OT  Wound Care  Procedures:   none  Antimicrobials:   Ceftriaxone 12/29>12/31  Meropenem 12/31>   Subjective: Patient laying in bed and says she slept well last night.  Denies any new symptoms. No acute events overnight.  Objective: Vitals:   11/20/16 1828 11/20/16 2036 11/21/16 0500 11/21/16 0523  BP: (!) 98/49 (!) 108/54  (!) 93/46  Pulse: 73 76  77  Resp: 16 17  18   Temp: 98.2 F (36.8 C) 98.8 F (37.1 C)  98.6 F (37 C)  TempSrc: Oral Oral  Oral  SpO2: 99% 99%  100%  Weight:   87.5 kg (192 lb 14.4 oz)   Height:        Intake/Output Summary (Last 24 hours) at 11/21/16 0812 Last data filed at 11/21/16 0600  Gross per 24 hour  Intake              870 ml  Output              200 ml  Net              670 ml   Filed Weights   11/18/16 0138 11/18/16 2121 11/21/16 0500  Weight: 86.3  kg (190 lb 4.1 oz) 87.5 kg (192 lb 14.4 oz) 87.5 kg (192 lb 14.4 oz)    Examination:  General exam: Appears calm and comfortable  Respiratory system: Clear to auscultation. Respiratory effort normal. Cardiovascular system: S1 & S2 heard, RRR. No JVD, murmurs, rubs, gallops or clicks. No pedal edema. Gastrointestinal system: Abdomen is nondistended, soft and nontender. No organomegaly or masses felt. Normal bowel sounds heard. PEG tube in place in mid abdomen Central nervous system: Alert and oriented. No focal neurological deficits. Extremities: no cyanosis, clubbing or  edema Skin: No rashes, lesions or ulcers Psychiatry: Judgement and insight appear normal- patient asking appropriate questions. Mood & affect appropriate.     Data Reviewed: I have personally reviewed following labs and imaging studies  CBC:  Recent Labs Lab 11/17/16 1330 11/18/16 0505 11/19/16 0437  WBC 6.8 4.4 4.5  NEUTROABS 5.0  --  2.9  HGB 9.2* 8.4* 8.4*  HCT 27.7* 26.2* 25.7*  MCV 89.6 91.0 91.8  PLT 248 231 [id number]   Basic Metabolic Panel:  Recent Labs Lab 11/17/16 1330 11/18/16 0009 11/18/16 0505 11/19/16 0437 11/21/16 0450  NA 137  --  139 140 138  K 4.2  --  4.1 4.2 4.0  CL 104  --  105 104 99*  CO2 25  --  27 29 34*  GLUCOSE 179*  --  181* 255* 275*  BUN 34*  --  35* 41* 41*  CREATININE 1.07*  --  1.06* 1.24* 1.34*  CALCIUM 9.7  --  9.2 9.0 9.0  MG  --  2.0  --  1.8 1.9   GFR: Estimated Creatinine Clearance: 40.4 mL/min (by C-G formula based on SCr of 1.34 mg/dL (H)). Liver Function Tests:  Recent Labs Lab 11/18/16 0505 11/19/16 0437  AST 34 28  ALT 42 41  ALKPHOS 108 101  BILITOT 0.8 0.4  PROT 6.4* 6.0*  ALBUMIN 2.0* 1.9*   No results for input(s): LIPASE, AMYLASE in the last 168 hours. No results for input(s): AMMONIA in the last 168 hours. Coagulation Profile:  Recent Labs Lab 11/18/16 0505  INR 1.30   Cardiac Enzymes: No results for input(s): CKTOTAL, CKMB, CKMBINDEX, TROPONINI in the last 168 hours. BNP (last 3 results) No results for input(s): PROBNP in the last 8760 hours. HbA1C: No results for input(s): HGBA1C in the last 72 hours. CBG:  Recent Labs Lab 11/20/16 0738 11/20/16 1139 11/20/16 1551 11/20/16 2021 11/21/16 0003  GLUCAP 166* 169* 215* 207* 259*   Lipid Profile: No results for input(s): CHOL, HDL, LDLCALC, TRIG, CHOLHDL, LDLDIRECT in the last 72 hours. Thyroid Function Tests: No results for input(s): TSH, T4TOTAL, FREET4, T3FREE, THYROIDAB in the last 72 hours. Anemia Panel: No results for input(s):  VITAMINB12, FOLATE, FERRITIN, TIBC, IRON, RETICCTPCT in the last 72 hours. Sepsis Labs:  Recent Labs Lab 11/18/16 1407 11/20/16 0447  PROCALCITON <0.10 <0.10    Recent Results (from the past 240 hour(s))  Urine culture     Status: Abnormal   Collection Time: 11/17/16  3:03 PM  Result Value Ref Range Status   Specimen Description URINE, CLEAN CATCH  Final   Special Requests NONE  Final   Culture (A)  Final    >=100,000 COLONIES/mL ESCHERICHIA COLI Confirmed Extended Spectrum Beta-Lactamase Producer (ESBL)    Report Status 11/19/2016 FINAL  Final   Organism ID, Bacteria ESCHERICHIA COLI (A)  Final      Susceptibility   Escherichia coli - MIC*    AMPICILLIN >=32 RESISTANT  Resistant     CEFAZOLIN >=64 RESISTANT Resistant     CEFTRIAXONE >=64 RESISTANT Resistant     CIPROFLOXACIN >=4 RESISTANT Resistant     GENTAMICIN <=1 SENSITIVE Sensitive     IMIPENEM <=0.25 SENSITIVE Sensitive     NITROFURANTOIN <=16 SENSITIVE Sensitive     TRIMETH/SULFA <=20 SENSITIVE Sensitive     AMPICILLIN/SULBACTAM >=32 RESISTANT Resistant     PIP/TAZO 32 INTERMEDIATE Intermediate     Extended ESBL POSITIVE Resistant     * >=100,000 COLONIES/mL ESCHERICHIA COLI  MRSA PCR Screening     Status: None   Collection Time: 11/18/16  1:44 AM  Result Value Ref Range Status   MRSA by PCR NEGATIVE NEGATIVE Final    Comment:        The GeneXpert MRSA Assay (FDA approved for NASAL specimens only), is one component of a comprehensive MRSA colonization surveillance program. It is not intended to diagnose MRSA infection nor to guide or monitor treatment for MRSA infections.   Culture, expectorated sputum-assessment     Status: None   Collection Time: 11/18/16  1:33 PM  Result Value Ref Range Status   Specimen Description EXPECTORATED SPUTUM  Final   Special Requests NONE  Final   Sputum evaluation   Final    Sputum specimen not acceptable for testing.  Please recollect.   RESULT CALLED TO, READ BACK BY  AND VERIFIED WITH: L HITT 11/21/16 @ 0440 M VESTAL    Report Status 11/21/2016 FINAL  Final         Radiology Studies: No results found.      Scheduled Meds: . aspirin  81 mg Per Tube Daily  . enoxaparin (LOVENOX) injection  40 mg Subcutaneous Q24H  . feeding supplement (PRO-STAT SUGAR FREE 64)  30 mL Per Tube Daily  . free water  100 mL Per Tube Q8H  . insulin glargine  12 Units Subcutaneous QHS  . meropenem (MERREM) IV  1 g Intravenous Q12H  . metoprolol tartrate  25 mg Per Tube BID  . nortriptyline  10 mg Per Tube QHS  . pantoprazole sodium  40 mg Per Tube Daily  . sodium chloride flush  10-40 mL Intracatheter Q12H  . sodium chloride flush  3 mL Intravenous Q12H   Continuous Infusions: . feeding supplement (OSMOLITE 1.5 CAL) 1,000 mL (11/20/16 2202)     LOS: 4 days    Time spent: 30 minutes    Loretha Stapler, MD [facility] Pager [phone]  If 7PM-7AM, please contact night-coverage [email or web address] Password [email or web address] 11/21/2016, 8:12 AM

## 2016-11-22 DIAGNOSIS — Z1612 Extended spectrum beta lactamase (ESBL) resistance: Secondary | ICD-10-CM

## 2016-11-22 DIAGNOSIS — B9629 Other Escherichia coli [E. coli] as the cause of diseases classified elsewhere: Secondary | ICD-10-CM | POA: Diagnosis present

## 2016-11-22 DIAGNOSIS — N390 Urinary tract infection, site not specified: Secondary | ICD-10-CM

## 2016-11-22 DIAGNOSIS — I5043 Acute on chronic combined systolic (congestive) and diastolic (congestive) heart failure: Secondary | ICD-10-CM

## 2016-11-22 LAB — CBC WITH DIFFERENTIAL/PLATELET
Basophils Absolute: 0 10*3/uL (ref 0.0–0.1)
Basophils Relative: 1 %
Eosinophils Absolute: 0.3 10*3/uL (ref 0.0–0.7)
Eosinophils Relative: 6 %
HEMATOCRIT: 26.7 % — AB (ref 36.0–46.0)
Hemoglobin: 8.3 g/dL — ABNORMAL LOW (ref 12.0–15.0)
LYMPHS ABS: 1.6 10*3/uL (ref 0.7–4.0)
Lymphocytes Relative: 36 %
MCH: 29.4 pg (ref 26.0–34.0)
MCHC: 31.1 g/dL (ref 30.0–36.0)
MCV: 94.7 fL (ref 78.0–100.0)
MONOS PCT: 8 %
Monocytes Absolute: 0.4 10*3/uL (ref 0.1–1.0)
NEUTROS ABS: 2.1 10*3/uL (ref 1.7–7.7)
Neutrophils Relative %: 49 %
Platelets: 216 10*3/uL (ref 150–400)
RBC: 2.82 MIL/uL — ABNORMAL LOW (ref 3.87–5.11)
RDW: 18.2 % — ABNORMAL HIGH (ref 11.5–15.5)
WBC: 4.4 10*3/uL (ref 4.0–10.5)

## 2016-11-22 LAB — GLUCOSE, CAPILLARY
GLUCOSE-CAPILLARY: 156 mg/dL — AB (ref 65–99)
GLUCOSE-CAPILLARY: 191 mg/dL — AB (ref 65–99)
GLUCOSE-CAPILLARY: 228 mg/dL — AB (ref 65–99)
Glucose-Capillary: 252 mg/dL — ABNORMAL HIGH (ref 65–99)
Glucose-Capillary: 241 mg/dL — ABNORMAL HIGH (ref 65–99)
Glucose-Capillary: 262 mg/dL — ABNORMAL HIGH (ref 65–99)
Glucose-Capillary: 149 mg/dL — ABNORMAL HIGH (ref 65–99)

## 2016-11-22 LAB — BASIC METABOLIC PANEL
ANION GAP: 5 (ref 5–15)
BUN: 44 mg/dL — ABNORMAL HIGH (ref 6–20)
CHLORIDE: 102 mmol/L (ref 101–111)
CO2: 35 mmol/L — AB (ref 22–32)
Calcium: 9.4 mg/dL (ref 8.9–10.3)
Creatinine, Ser: 1.3 mg/dL — ABNORMAL HIGH (ref 0.44–1.00)
GFR calc non Af Amer: 39 mL/min — ABNORMAL LOW (ref >60)
GFR, EST AFRICAN AMERICAN: 45 mL/min — AB (ref >60)
Glucose, Bld: 257 mg/dL — ABNORMAL HIGH (ref 65–99)
Potassium: 4.2 mmol/L (ref 3.5–5.1)
Sodium: 142 mmol/L (ref 135–145)

## 2016-11-22 LAB — PROCALCITONIN: Procalcitonin: <0.1 ng/mL

## 2016-11-22 MED ORDER — FUROSEMIDE 10 MG/ML PO SOLN
20.0000 mg | Freq: Every day | ORAL | Status: DC
Start: 2016-11-22 — End: 2016-11-23
  Administered 2016-11-22 – 2016-11-23 (×2): 20 mg
  Filled 2016-11-22 (×2): qty 2

## 2016-11-22 NOTE — Progress Notes (Signed)
PROGRESS NOTE    NEHEMIAH Rhodes  [id number] DOB: 1941-07-03 DOA: 11/17/2016 PCP: Vidal Schwalbe, MD  Patient coming from: [place] Narrative:  Mandy Rhodes is a 76 y.o. Female with PMHx of systolic CHF, DM type 2, CKD stage 2-3, recent admission 11/24-12/8/17 for infected cervical hardware with retropharyngeal past sinus status post short-term PEG tube, recent admission 12/13 for acute respiratory failure secondary to heart failure started on Lasix. She presented back to [facility] due to confusion, AMS, and acute respiratory failure.   Assessment & Plan:   Principal Problem:   Acute on chronic combined systolic and diastolic heart failure (HCC) Active Problems:   Diabetes mellitus (HCC)   CKD (chronic kidney disease) stage 3, GFR 30-59 ml/min   Non-ischemic cardiomyopathy (HCC)   Acute respiratory failure with hypoxia (HCC)   UTI due to extended-spectrum beta lactamase (ESBL) producing Escherichia coli  Acute hypoxic respiratory failure secondary to decompensated systolic and diastolic combined heart failure -CT chest is negative for pulmonary embolus -Echo 10/13/2016: EF [id number], grade 2 diastolic dysfunction  -Appears that patient did not continue lasix on discharge from last admission, has received IV lasix this admission. Continue lasix per tube 20mg  daily.  -I/Os are not accurately documented, weight has not changed since admission   ESBL UTI, POA  -Started on merrem and given fosfomycin   Recent complicated admissions with infected cervical hardware status post Unasyn therapy through 11/07/2016 -Her right-sided PICC line/central line should be removed prior to discharge back to facility -Needs follow up with neurosurgery on discharge (supposed to be on 11/22/16)  Diabetes mellitus type 2  -Ha1c 7.1 -Continue Lantus 12 units  -Sliding scale coverage   Chronic kidney disease stage III -Stable   Nonobstructive CAD per cath 03/2016 -Recent  NSTEMI with SVT. ? Cardiac amyloid  -Monitor as outpatient -Asprin, metoprolol   Recent PEG tube placement -Continue meds via PEG tube -Feeding per nutrition    DVT prophylaxis: lovenox Code Status: DNR Family Communication: no family at bedside Disposition Plan: SNF when stable   Consultants:   None  Procedures:   None  Antimicrobials:  Anti-infectives    Start     Dose/Rate Route Frequency Ordered Stop   11/22/16 0300  meropenem (MERREM) 1 g in sodium chloride 0.9 % 100 mL IVPB  Status:  Discontinued     1 g 200 mL/hr over 30 Minutes Intravenous Every 12 hours 11/21/16 1623 11/21/16 1644   11/21/16 1644  meropenem (MERREM) 1 g in sodium chloride 0.9 % 100 mL IVPB     1 g 200 mL/hr over 30 Minutes Intravenous Every 12 hours 11/21/16 1644 11/21/16 1817   11/19/16 1500  meropenem (MERREM) 1 g in sodium chloride 0.9 % 100 mL IVPB  Status:  Discontinued     1 g 200 mL/hr over 30 Minutes Intravenous Every 12 hours 11/19/16 1345 11/21/16 1623   11/18/16 1845  cefTRIAXone (ROCEPHIN) 1 g in dextrose 5 % 50 mL IVPB  Status:  Discontinued     1 g 100 mL/hr over 30 Minutes Intravenous Every 24 hours 11/18/16 1819 11/19/16 1338           Subjective: No complaints this morning. Tolerating tube feedings. Denies any chest pain, shortness of breath.   Objective: Vitals:   11/21/16 1739 11/21/16 2002 11/22/16 0420 11/22/16 1025  BP: (!) 102/51 (!) 107/45 (!) 107/50 109/62  Pulse: 77 81 75 86  Resp: 18 18 18 18   Temp: 98.8  F (37.1 C) 98.1 F (36.7 C) 98.8 F (37.1 C) 98.5 F (36.9 C)  TempSrc: Oral Oral Oral Oral  SpO2: 98% 99% 96% 96%  Weight:  89 kg (196 lb 3.4 oz)    Height:        Intake/Output Summary (Last 24 hours) at 11/22/16 1528 Last data filed at 11/22/16 1100  Gross per 24 hour  Intake                0 ml  Output              175 ml  Net             -175 ml   Filed Weights   11/18/16 2121 11/21/16 0500 11/21/16 2002  Weight: 87.5 kg (192 lb 14.4  oz) 87.5 kg (192 lb 14.4 oz) 89 kg (196 lb 3.4 oz)    Examination:  General exam: Appears calm and comfortable  Respiratory system: Clear to auscultation. Respiratory effort normal. Cardiovascular system: S1 & S2 heard, RRR. No JVD, murmurs, rubs, gallops or clicks. No pedal edema. Gastrointestinal system: Abdomen is nondistended, soft and nontender. No organomegaly or masses felt. Normal bowel sounds heard. +PEG in place  Central nervous system: Alert and oriented. No focal neurological deficits. Extremities: Symmetric 5 x 5 power. Skin: No rashes, lesions or ulcers Psychiatry: Judgement and insight appear normal. Mood & affect appropriate.   Data Reviewed: I have personally reviewed following labs and imaging studies  CBC:  Recent Labs Lab 11/17/16 1330 11/18/16 0505 11/19/16 0437 11/22/16 0442  WBC 6.8 4.4 4.5 4.4  NEUTROABS 5.0  --  2.9 2.1  HGB 9.2* 8.4* 8.4* 8.3*  HCT 27.7* 26.2* 25.7* 26.7*  MCV 89.6 91.0 91.8 94.7  PLT 248 231 230 [id number]   Basic Metabolic Panel:  Recent Labs Lab 11/17/16 1330 11/18/16 0009 11/18/16 0505 11/19/16 0437 11/21/16 0450 11/22/16 0442  NA 137  --  139 140 138 142  K 4.2  --  4.1 4.2 4.0 4.2  CL 104  --  105 104 99* 102  CO2 25  --  27 29 34* 35*  GLUCOSE 179*  --  181* 255* 275* 257*  BUN 34*  --  35* 41* 41* 44*  CREATININE 1.07*  --  1.06* 1.24* 1.34* 1.30*  CALCIUM 9.7  --  9.2 9.0 9.0 9.4  MG  --  2.0  --  1.8 1.9  --    GFR: Estimated Creatinine Clearance: 42 mL/min (by C-G formula based on SCr of 1.3 mg/dL (H)). Liver Function Tests:  Recent Labs Lab 11/18/16 0505 11/19/16 0437  AST 34 28  ALT 42 41  ALKPHOS 108 101  BILITOT 0.8 0.4  PROT 6.4* 6.0*  ALBUMIN 2.0* 1.9*   No results for input(s): LIPASE, AMYLASE in the last 168 hours. No results for input(s): AMMONIA in the last 168 hours. Coagulation Profile:  Recent Labs Lab 11/18/16 0505  INR 1.30   Cardiac Enzymes: No results for input(s): CKTOTAL, CKMB,  CKMBINDEX, TROPONINI in the last 168 hours. BNP (last 3 results) No results for input(s): PROBNP in the last 8760 hours. HbA1C: No results for input(s): HGBA1C in the last 72 hours. CBG:  Recent Labs Lab 11/21/16 1959 11/22/16 0006 11/22/16 0412 11/22/16 0754 11/22/16 1227  GLUCAP 233* 228* 252* 241* 262*   Lipid Profile: No results for input(s): CHOL, HDL, LDLCALC, TRIG, CHOLHDL, LDLDIRECT in the last 72 hours. Thyroid Function Tests: No results for input(s): TSH,  T4TOTAL, FREET4, T3FREE, THYROIDAB in the last 72 hours. Anemia Panel: No results for input(s): VITAMINB12, FOLATE, FERRITIN, TIBC, IRON, RETICCTPCT in the last 72 hours. Sepsis Labs:  Recent Labs Lab 11/18/16 1407 11/20/16 0447 11/22/16 0442  PROCALCITON <0.10 <0.10 <0.10    Recent Results (from the past 240 hour(s))  Urine culture     Status: Abnormal   Collection Time: 11/17/16  3:03 PM  Result Value Ref Range Status   Specimen Description URINE, CLEAN CATCH  Final   Special Requests NONE  Final   Culture (A)  Final    >=100,000 COLONIES/mL ESCHERICHIA COLI Confirmed Extended Spectrum Beta-Lactamase Producer (ESBL)    Report Status 11/19/2016 FINAL  Final   Organism ID, Bacteria ESCHERICHIA COLI (A)  Final      Susceptibility   Escherichia coli - MIC*    AMPICILLIN >=32 RESISTANT Resistant     CEFAZOLIN >=64 RESISTANT Resistant     CEFTRIAXONE >=64 RESISTANT Resistant     CIPROFLOXACIN >=4 RESISTANT Resistant     GENTAMICIN <=1 SENSITIVE Sensitive     IMIPENEM <=0.25 SENSITIVE Sensitive     NITROFURANTOIN <=16 SENSITIVE Sensitive     TRIMETH/SULFA <=20 SENSITIVE Sensitive     AMPICILLIN/SULBACTAM >=32 RESISTANT Resistant     PIP/TAZO 32 INTERMEDIATE Intermediate     Extended ESBL POSITIVE Resistant     * >=100,000 COLONIES/mL ESCHERICHIA COLI  MRSA PCR Screening     Status: None   Collection Time: 11/18/16  1:44 AM  Result Value Ref Range Status   MRSA by PCR NEGATIVE NEGATIVE Final     Comment:        The GeneXpert MRSA Assay (FDA approved for NASAL specimens only), is one component of a comprehensive MRSA colonization surveillance program. It is not intended to diagnose MRSA infection nor to guide or monitor treatment for MRSA infections.   Culture, expectorated sputum-assessment     Status: None   Collection Time: 11/18/16  1:33 PM  Result Value Ref Range Status   Specimen Description EXPECTORATED SPUTUM  Final   Special Requests NONE  Final   Sputum evaluation   Final    Sputum specimen not acceptable for testing.  Please recollect.   RESULT CALLED TO, READ BACK BY AND VERIFIED WITH: L HITT 11/21/16 @ 0440 M VESTAL    Report Status 11/21/2016 FINAL  Final       Radiology Studies: No results found.    Scheduled Meds: . aspirin  81 mg Per Tube Daily  . enoxaparin (LOVENOX) injection  40 mg Subcutaneous Q24H  . feeding supplement (PRO-STAT SUGAR FREE 64)  30 mL Per Tube Daily  . free water  100 mL Per Tube Q8H  . insulin aspart  0-5 Units Subcutaneous QHS  . insulin aspart  0-9 Units Subcutaneous TID WC  . insulin glargine  12 Units Subcutaneous QHS  . metoprolol tartrate  25 mg Per Tube BID  . nortriptyline  10 mg Per Tube QHS  . pantoprazole sodium  40 mg Per Tube Daily  . sodium chloride flush  10-40 mL Intracatheter Q12H  . sodium chloride flush  3 mL Intravenous Q12H   Continuous Infusions: . feeding supplement (OSMOLITE 1.5 CAL) 1,000 mL (11/22/16 1509)     LOS: 5 days    Time spent: 40 minutes   Dessa Phi, DO [facility] [email or web address] Password [email or web address] 11/22/2016, 3:28 PM

## 2016-11-22 NOTE — Progress Notes (Signed)
Inpatient Diabetes Program Recommendations  AACE/ADA: New Consensus Statement on Inpatient Glycemic Control (2015)  Target Ranges:  Prepandial:   less than 140 mg/dL      Peak postprandial:   less than 180 mg/dL (1-2 hours)      Critically ill patients:  140 - 180 mg/dL   Results for ALEXEE, SPADAFORE (MRN [id number]) as of 11/22/2016 09:24  Ref. Range 11/21/2016 08:17 11/21/2016 11:38 11/21/2016 16:04 11/21/2016 19:59 11/22/2016 00:06 11/22/2016 04:12 11/22/2016 07:54  Glucose-Capillary Latest Ref Range: 65 - 99 mg/dL 257 (H) 241 (H) 236 (H) 233 (H) 228 (H) 252 (H) 241 (H)   Review of Glycemic Control  Diabetes history: DM 2 Outpatient Diabetes medications: Lantus 12 units Current orders for Inpatient glycemic control: Lantus 12 units, Novolog Sensitive Correction TID+ HS scale  Inpatient Diabetes Program Recommendations:   Glucose still elevated in the mid 200's. Please consider adjusting frequency of Correction to Q4 hours while patient is on Tube Feeds AND add Tube Feed Coverage Novolog 3 units Q4 hours in addition to correction (Hold if tube feeds are stopped or held for any reason).  Thanks,  Tama Headings RN, MSN, [facility] Inpatient Diabetes Coordinator Team Pager [phone] (8a-5p)

## 2016-11-22 NOTE — Evaluation (Signed)
Physical Therapy Evaluation Patient Details Name: Mandy Rhodes MRN: [id number] DOB: Feb 04, 1941 Today's Date: 11/22/2016   History of Present Illness  Pt is a 76 y/o female admitted from SNF with AMS and confusion.  Pt with previous admissions including one on 11/01/16 for acute respiratory failure with hypoxemia from CHF exacerbation from [place]. Pt with SOB and weakness, sepsis due to esophageal abscess with perforation 10/21/16-10/28/16. Pt with NSTEMI, VDRF with ETT 11/23-12/1, ACDF hardware removal with I&D. PMHx: DM, CHF, CKD, HTN, anemia, R rotator cuff tear, spinal stenosis, neuropathy, hyperlipidemia.She presents today for SOB and hypoxia to as low as 82%.  +chest pressure across her chest  Clinical Impression  Pt admitted with above diagnosis. Pt currently with functional limitations due to the deficits listed below (see PT Problem List).  Pt will benefit from skilled PT to increase their independence and safety with mobility to allow discharge to the venue listed below. Pt is motivated to get stronger and worked well with sitting EOB, but difficulty when attempting to stand due to posterior lean and heavy extension.  Recommend returning to SNF for continued rehab.      Follow Up Recommendations SNF;Supervision/Assistance - 24 hour    Equipment Recommendations  None recommended by PT    Recommendations for Other Services       Precautions / Restrictions Precautions Precautions: Fall;Cervical Restrictions Weight Bearing Restrictions: No      Mobility  Bed Mobility Overal bed mobility: Needs Assistance Bed Mobility: Rolling;Sidelying to Sit Rolling: Mod assist Sidelying to sit: +2 for physical assistance;Max assist   Sit to supine: Mod assist;+2 for physical assistance   General bed mobility comments: Pt unable to effectively use UE to re-position self at EOB.    Transfers Overall transfer level: Needs assistance               General transfer comment: Was  going to attempt standing, but with attempt, pt began extending backward and not safe with her balance  Ambulation/Gait                Stairs            Wheelchair Mobility    Modified Rankin (Stroke Patients Only)       Balance Overall balance assessment: Needs assistance Sitting-balance support: Feet supported;Bilateral upper extremity supported Sitting balance-Leahy Scale: Poor Sitting balance - Comments: Pt with tendency to lean to the R and posteriorly, but would be able to center self with cues of forward weight shifting.   Postural control: Right lateral lean   Standing balance-Leahy Scale: Zero Standing balance comment: Attempted to stand, but pt went into extreme backwared leaning and not safe to attempt                             Pertinent Vitals/Pain Pain Assessment: Faces Faces Pain Scale: Hurts little more Pain Location: bottom Pain Descriptors / Indicators: Grimacing Pain Intervention(s): Limited activity within patient's tolerance;Repositioned    Home Living Family/patient expects to be discharged to:: Skilled nursing facility                      Prior Function Level of Independence: Needs assistance   Gait / Transfers Assistance Needed: unable to ambulate during SNF stay. Patient reports staying in bed for most of stay in [place]. Was working on standing  ADL's / Homemaking Assistance Needed: completely dependent on SNF staff for ADLs at bed level (  bathing, dressing)  Comments: Patient now comes from [place], where patient reports being dependent. Prior to SNF, patient was using RW, BSC over toilet, shower chair. Patient was also able to do homemaking and driving.      Hand Dominance   Dominant Hand: Right (has more use with L hand)    Extremity/Trunk Assessment   Upper Extremity Assessment Upper Extremity Assessment: Defer to OT evaluation    Lower Extremity Assessment Lower Extremity Assessment: Generalized  weakness       Communication   Communication: No difficulties  Cognition Arousal/Alertness: Awake/alert Behavior During Therapy: WFL for tasks assessed/performed;Flat affect Overall Cognitive Status: Within Functional Limits for tasks assessed                      General Comments      Exercises     Assessment/Plan    PT Assessment Patient needs continued PT services  PT Problem List Decreased strength;Decreased activity tolerance;Decreased balance;Decreased mobility;Decreased safety awareness;Decreased knowledge of use of DME;Cardiopulmonary status limiting activity          PT Treatment Interventions DME instruction;Functional mobility training;Therapeutic activities;Therapeutic exercise;Balance training    PT Goals (Current goals can be found in the Care Plan section)  Acute Rehab PT Goals Patient Stated Goal: return to independence PT Goal Formulation: With patient Time For Goal Achievement: 12/06/16 Potential to Achieve Goals: Fair    Frequency Min 2X/week   Barriers to discharge        Co-evaluation               End of Session Equipment Utilized During Treatment: Gait belt Activity Tolerance: Patient limited by fatigue Patient left: in bed;with call bell/phone within reach;with nursing/sitter in room Nurse Communication: Mobility status         Time: [id number] PT Time Calculation (min) (ACUTE ONLY): 22 min   Charges:   PT Evaluation $PT Eval Moderate Complexity: 1 Procedure     PT G Codes:        Turki Tapanes LUBECK 11/22/2016, 1:25 PM

## 2016-11-23 ENCOUNTER — Encounter: Payer: Self-pay | Admitting: *Deleted

## 2016-11-23 LAB — BASIC METABOLIC PANEL
ANION GAP: 5 (ref 5–15)
BUN: 40 mg/dL — ABNORMAL HIGH (ref 6–20)
CALCIUM: 9.2 mg/dL (ref 8.9–10.3)
CO2: 34 mmol/L — ABNORMAL HIGH (ref 22–32)
Chloride: 102 mmol/L (ref 101–111)
Creatinine, Ser: 1.24 mg/dL — ABNORMAL HIGH (ref 0.44–1.00)
GFR, EST AFRICAN AMERICAN: 48 mL/min — AB (ref >60)
GFR, EST NON AFRICAN AMERICAN: 41 mL/min — AB (ref >60)
Glucose, Bld: 219 mg/dL — ABNORMAL HIGH (ref 65–99)
POTASSIUM: 4.2 mmol/L (ref 3.5–5.1)
SODIUM: 141 mmol/L (ref 135–145)

## 2016-11-23 LAB — GLUCOSE, CAPILLARY
GLUCOSE-CAPILLARY: 200 mg/dL — AB (ref 65–99)
GLUCOSE-CAPILLARY: 232 mg/dL — AB (ref 65–99)
GLUCOSE-CAPILLARY: 224 mg/dL — AB (ref 65–99)
Glucose-Capillary: 240 mg/dL — ABNORMAL HIGH (ref 65–99)

## 2016-11-23 MED ORDER — FUROSEMIDE 10 MG/ML PO SOLN: 20.0000 mg | Freq: Every day | ORAL | 0 refills | Status: DC

## 2016-11-23 NOTE — Discharge Summary (Signed)
Physician Discharge Summary  Mandy Rhodes [id number] DOB: 10-11-1941 DOA: 11/17/2016  PCP: Vidal Schwalbe, MD  Admit date: 11/17/2016 Discharge date: 11/23/2016  Admitted From: [facility] Disposition:  [facility]  Recommendations for Outpatient Follow-up:  1. Follow up with PCP in 1-2 weeks 2. Follow up with Neurosurgery next week as you missed your appointment on 1/3  3. Please obtain BMP/CBC in one week   Home Health: No  Equipment/Devices: None   Discharge Condition: Stable CODE STATUS: DNR  Diet recommendation: NPO, tube feed through PEG only   Brief/Interim Summary: Mandy Rhodes is a 76 y.o. Female with PMHx of systolic CHF, DM type 2, CKD stage 2-3, recent admission 11/24-12/8/17 for infected cervical hardware with retropharyngeal abscess-status post exploration and irrigation debridement of prevertebral abscess and removal of hardware, status post short-term PEG tube, recent admission 12/13 for acute respiratory failure secondary to heart failurestarted on Lasix. She presented back to [facility] due to confusion, AMS, and acute respiratory failure. During hospitalization, she was treated with IV lasix with good response and transitioned to PEG dosing. She was also diagnosed with ESBL UTI POA treated with merrem, then fosfomycin.   Subjective on day of discharge: Feeling back to baseline today. Denies any chest pain, SOB, vomiting, diarrhea, abdominal pain. Afebrile.   Discharge Diagnoses:  Principal Problem:   Acute on chronic combined systolic and diastolic heart failure (HCC) Active Problems:   Diabetes mellitus (HCC)   CKD (chronic kidney disease) stage 3, GFR 30-59 ml/min   Non-ischemic cardiomyopathy (HCC)   Acute respiratory failure with hypoxia (HCC)   UTI due to extended-spectrum beta lactamase (ESBL) producing Escherichia coli  Acute hypoxic respiratory failure secondary to decompensated systolic and diastolic combined heart failure -CT chest  is negative for pulmonary embolus -Echo 10/13/2016: EF [id number], grade 2 diastolic dysfunction  -Appears that patient did not continue lasix on discharge from last admission, has received IV lasix this admission. Continue lasix per tube 20mg  daily.  -I/Os are not accurately documented. Weight is 89.2 kg at time of discharge, was 88.3kg at time of discharge in December 2017  -Wean [employer] O2   ESBL UTI, POA  -Started on merrem and given fosfomycin   Recent complicated admissions with infected cervical hardware status post Unasyn therapy through 11/07/2016 -Her right-sided PICC line to be removed today prior to discharge  -Needs follow up with neurosurgery on discharge (supposed to see them 11/22/16)  Diabetes mellitus type 2  -Ha1c 7.1 -Continue Lantus 12 units  -Sliding scale coverage   Chronic kidney disease stage III -Stable   Nonobstructive CAD per cath 03/2016 -Recent NSTEMI with SVT. 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 Recently active with Dale. Active consent on file. Multiple hospitalizations and co-morbities. Likely dc back to [facility] today. Please call with questions. Thanks. Marthenia Rolling, [place], [facility] [id number]   Reason for consult:  Please assign to [facility] LCSW   Expected date of contact:  1-3 days (reserved for hospital discharges)   Increase activity slowly    Complete by:  As directed      Allergies as of 11/23/2016      Reactions   Sulfa  Antibiotics Swelling      Medication List    TAKE these medications   aspirin 81 MG EC tablet Take 1 tablet (81 mg total) by mouth daily. What changed:  how to take this  when to take  this  additional instructions   bisacodyl 10 MG suppository Commonly known as:  DULCOLAX Place 10 mg rectally once as needed (if no relief from Milk of Magnesia).   CALCIUM 600 + D PO Place 1 tablet into feeding tube every morning.   cholecalciferol 1000 units tablet Commonly known as:  VITAMIN D 1,000 Units See admin instructions. Per feeding tube once a day   feeding supplement (PRO-STAT SUGAR FREE 64) Liqd Place 30 mLs into feeding tube daily. FLUSH WITH 30 ML'S WATER BEFORE AND AFTER MEDICATIONS   ferrous sulfate 325 (65 FE) MG EC tablet 325 mg See admin instructions. Per feeding tube in the morning   free water Soln Place 100 mLs into feeding tube See admin instructions. 100 cc Q 8 hrs only   furosemide 10 MG/ML solution Commonly known as:  LASIX Place 2 mLs (20 mg total) into feeding tube daily. Start taking on:  11/24/2016   irbesartan 75 MG tablet Commonly known as:  AVAPRO Take 0.5 tablets (37.5 mg total) by mouth daily. What changed:  how to take this   LANTUS SOLOSTAR 100 UNIT/ML Solostar Pen Generic drug:  Insulin Glargine Inject 12 Units into the skin at bedtime.   magnesium hydroxide 400 MG/5ML suspension Commonly known as:  MILK OF MAGNESIA Place 30 mLs into feeding tube once as needed (if no bowel movement in 3 days).   metoprolol tartrate 25 MG tablet Commonly known as:  LOPRESSOR Place 1 tablet (25 mg total) into feeding tube 2 (two) times daily.   nortriptyline 10 MG capsule Commonly known as:  PAMELOR Place 10 mg into feeding tube at bedtime.   NUTREN 2.0 Liqd 40 mL/hr. "CONTINUOUSLY WITH FLUSH 175 ML'S EVERY 4 HOURS VIA PUMP/PROVIDES 2,020 KCAL/DAY"   omeprazole 40 MG capsule Commonly known as:  PRILOSEC 40 mg See admin instructions. Per feeding tube once a day   OXYGEN Inhale 2 L into the lungs daily as needed (if O2 stats are <90%).   RA SALINE ENEMA 19-7 GM/118ML Enem Place 1 enema rectally once as needed (if constipation is not  relieved by Dulcolax suppository).   TAB-A-VITE Tabs Place 1 tablet into feeding tube every morning.      Follow-up Information    WHITE,CYNTHIA S, MD. Schedule an appointment as soon as possible for a visit in 1 week(s).   Specialty:  Family Medicine Contact information: [place] [address] [phone]        Charlie Pitter, MD. Schedule an appointment as soon as possible for a visit in 1 week(s).   Specialty:  Neurosurgery Contact information: [address] [place] [address] [phone]          Allergies  Allergen Reactions  . Sulfa Antibiotics Swelling    Consultations:  None   Procedures/Studies: X-ray Chest Pa And Lateral  Result Date: 11/18/2016 CLINICAL DATA:  Productive cough and congestion for 2 weeks EXAM: CHEST  2 VIEW COMPARISON:  11/05/2016 FINDINGS: Cardiomegaly. Right PICC line remains in place, unchanged. There is vascular congestion. Left lower lobe atelectasis or infiltrate. Small bilateral pleural effusions. IMPRESSION: Cardiomegaly with vascular congestion. Small bilateral effusions. Left lower lobe atelectasis or pneumonia. Electronically Signed   By: Lennette Bihari  [place] M.D.   On: 11/18/2016 09:45   Ct Head Wo Contrast  Result Date: 11/17/2016 CLINICAL DATA:  Dizziness, confusion EXAM: CT HEAD WITHOUT CONTRAST TECHNIQUE: Contiguous axial images were obtained from the base of the skull through the vertex without intravenous contrast. COMPARISON:  10/13/2016 FINDINGS: Brain: There is atrophy and chronic small vessel disease changes. No acute intracranial abnormality. Specifically, no hemorrhage, hydrocephalus, mass lesion, acute infarction, or significant intracranial injury. Vascular: No hyperdense vessel or unexpected calcification. Skull: No acute calvarial abnormality. Sinuses/Orbits: Visualized paranasal sinuses and mastoids clear. Orbital soft tissues unremarkable. Other: None IMPRESSION: No acute  intracranial abnormality. Atrophy, chronic microvascular disease. Electronically Signed   By: Rolm Baptise M.D.   On: 11/17/2016 15:59   Ct Angio Chest Pe W/cm &/or Wo Cm  Result Date: 11/17/2016 CLINICAL DATA:  Shortness of Breath since yesterday. EXAM: CT ANGIOGRAPHY CHEST WITH CONTRAST TECHNIQUE: Multidetector CT imaging of the chest was performed using the standard protocol during bolus administration of intravenous contrast. Multiplanar CT image reconstructions and MIPs were obtained to evaluate the vascular anatomy. CONTRAST:  100 cc Isovue 370 COMPARISON:  10/13/2016 FINDINGS: Cardiovascular: The heart is enlarged. No pericardial effusion. Coronary artery calcification is noted. Atherosclerotic calcification is noted in the wall of the thoracic aorta. No filling defect in the opacified pulmonary arteries to suggest the presence of an acute pulmonary embolus. Right PICC line tip is positioned in the distal SVC. Mediastinum/Nodes: Upper normal paratracheal lymph nodes are evident an 11 mm subcarinal short axis lymph node is upper normal to borderline increased. No hilar lymphadenopathy. Fluid in the esophagus may be related to dysmotility or reflux. Lungs/Pleura: Interlobular septal thickening with diffuse ground-glass attenuation is noted bilaterally. Small bilateral pleural effusions are associated with compressive atelectasis in the lower lobes bilaterally. Scattered areas of ground-glass nodularity likely related to the more diffuse underlying airspace disease. Upper Abdomen: Unremarkable. Musculoskeletal: Bone windows reveal no worrisome lytic or sclerotic osseous lesions. Degenerative changes are seen in the shoulders bilaterally. Review of the MIP images confirms the above findings. IMPRESSION: 1. No CT evidence for acute pulmonary embolus. 2. Diffuse interlobular and ground-glass airspace opacity suggests pulmonary edema. 3. Bibasilar dependent atelectasis with small bilateral pleural effusions.  Electronically Signed   By: Misty Stanley M.D.   On: 11/17/2016 16:18   Ir Gastrostomy Tube Mod Sed  Result Date: 10/25/2016 INDICATION: 76 year old female with a history of tracheal perforation and retropharyngeal abscess. Gastrostomy tube placement is required as the patient's remain NPO for an extended period of time. EXAM: Fluoroscopically guided placement of percutaneous pull-through gastrostomy tube Interventional Radiologist:  Criselda Peaches, MD MEDICATIONS: Currently receiving intravenous antibiotics during this hospital admission (Unasyn). Most recent dose administered within 1 hour of skin incision. 1 mg glucagon also administered intravenously. ANESTHESIA/SEDATION: Versed 3 mg IV; Fentanyl 125 mcg IV Moderate Sedation Time:  45 minutes The patient was continuously monitored during the procedure by the interventional radiology nurse under my direct supervision. CONTRAST:  10 mL Isovue-300 FLUOROSCOPY TIME:  Fluoroscopy Time: 2 minutes 12 seconds (37 mGy). COMPLICATIONS: None immediate. PROCEDURE: Informed written consent was obtained from the patient after a thorough discussion of the procedural risks, benefits and alternatives. All questions were addressed. Maximal Sterile Barrier Technique was utilized including caps, mask, sterile gowns, sterile gloves, sterile drape, hand hygiene and skin antiseptic. A timeout was performed prior to the initiation of the procedure. Maximal barrier sterile technique utilized including caps, mask, sterile gowns, sterile gloves, large sterile drape, hand hygiene, and chlorhexadine  skin prep. An angled catheter was advanced over a wire under fluoroscopic guidance through the nose, down the esophagus and into the body of the stomach. The stomach was then insufflated with several 100 ml of air. Fluoroscopy confirmed location of the gastric bubble, as well as inferior displacement of the barium stained colon. Under direct fluoroscopic guidance, a single T-tack was  placed, and the anterior gastric wall drawn up against the anterior abdominal wall. Percutaneous access was then obtained into the mid gastric body with an 18 gauge sheath needle. Aspiration of air, and injection of contrast material under fluoroscopy confirmed needle placement. An Amplatz wire was advanced in the gastric body and the access needle exchanged for a 9-French vascular sheath. A snare device was advanced through the vascular sheath and an Amplatz wire advanced through the angled catheter. The Amplatz wire was successfully snared and this was pulled up through the esophagus and out the mouth. A 20-French Alinda Dooms MIC-PEG tube was then connected to the snare and pulled through the mouth, down the esophagus, into the stomach and out to the anterior abdominal wall. Hand injection of contrast material confirmed intragastric location. The T-tack retention suture was then cut. The pull through peg tube was then secured with the external bumper and capped. The patient will be observed for several hours with the newly placed tube on low wall suction to evaluate for any post procedure complication. The patient tolerated the procedure well, there is no immediate complication. IMPRESSION: Successful placement of a 20 French pull through gastrostomy tube. Electronically Signed   By: [name] M.D.   On: 10/25/2016 15:43   Dg Chest Port 1 View  Result Date: 11/05/2016 CLINICAL DATA:  Hypoxia EXAM: PORTABLE CHEST 1 VIEW COMPARISON:  11/01/2016 FINDINGS: Cardiomegaly again noted. No acute infiltrate or pulmonary edema. Stable right arm PICC line position. No pneumothorax. Improvement in aeration without pulmonary edema. Mild basilar atelectasis. No segmental infiltrate. Degenerative changes bilateral shoulders. IMPRESSION: No acute infiltrate or pulmonary edema. Stable right arm PICC line position. No pneumothorax. Improvement in aeration without pulmonary edema. Mild basilar atelectasis. No  segmental infiltrate. Electronically Signed   By: Lahoma Crocker M.D.   On: 11/05/2016 10:23   Dg Chest Portable 1 View  Result Date: 11/01/2016 CLINICAL DATA:  Shortness of Breath EXAM: PORTABLE CHEST 1 VIEW COMPARISON:  10/26/2016 FINDINGS: Right-sided PICC line is again identified and stable. Cardiomegaly is again seen. The lungs are well aerated with mild bibasilar atelectatic changes and small left pleural effusion. The overall appearance is stable from the prior exam. The degree of vascular congestion has improved slightly in the interval from the prior study. IMPRESSION: Bibasilar changes stable from the recent exam. Stable right PICC line. Electronically Signed   By: Inez Catalina M.D.   On: 11/01/2016 15:37   Dg Chest Port 1 View  Result Date: 10/26/2016 CLINICAL DATA:  76 year old female line placement. Initial encounter. EXAM: PORTABLE CHEST 1 VIEW COMPARISON:  10/21/2016 and earlier. FINDINGS: Portable AP semi upright view at 1237 hours. Right upper extremity approach PICC line catheter now in place. Tip projects at the cavoatrial junction level. Stable cardiomegaly and mediastinal contours. Dense retrocardiac opacification and obscuration of the left hemidiaphragm. Mildly decreased pulmonary vascularity since 10/21/2016. No pneumothorax. Possible small left pleural effusion. IMPRESSION: 1. Right PICC line in place, tip at the cavoatrial junction level. 2. Left greater than right lower lobe collapse or consolidation. Probable small left pleural effusion. 3. Mildly decreased pulmonary vascular congestion. Electronically Signed  By: Genevie Ann M.D.   On: 10/26/2016 12:54      Discharge Exam: Vitals:   11/23/16 0607 11/23/16 0900  BP: 128/70 (!) 100/44  Pulse: 74 81  Resp: 19 18  Temp: 98.4 F (36.9 C) 98.5 F (36.9 C)   Vitals:   11/22/16 1025 11/22/16 1716 11/23/16 0607 11/23/16 0900  BP: 109/62 124/68 128/70 (!) 100/44  Pulse: 86 69 74 81  Resp: 18 (!) 128 19 18  Temp: 98.5 F (36.9  C) 98.6 F (37 C) 98.4 F (36.9 C) 98.5 F (36.9 C)  TempSrc: Oral Oral Oral Oral  SpO2: 96% 98% 99% 94%  Weight:   89.2 kg (196 lb 10.4 oz)   Height:       General: Pt is alert, awake, not in acute distress Cardiovascular: RRR, S1/S2 +, no rubs, no gallops Respiratory: CTA bilaterally, no wheezing, no rhonchi Abdominal: Soft, NT, ND, bowel sounds +, +PEG in place  Extremities: no edema, no cyanosis    The results of significant diagnostics from this hospitalization (including imaging, microbiology, ancillary and laboratory) are listed below for reference.     Microbiology: Recent Results (from the past 240 hour(s))  Urine culture     Status: Abnormal   Collection Time: 11/17/16  3:03 PM  Result Value Ref Range Status   Specimen Description URINE, CLEAN CATCH  Final   Special Requests NONE  Final   Culture (A)  Final    >=100,000 COLONIES/mL ESCHERICHIA COLI Confirmed Extended Spectrum Beta-Lactamase Producer (ESBL)    Report Status 11/19/2016 FINAL  Final   Organism ID, Bacteria ESCHERICHIA COLI (A)  Final      Susceptibility   Escherichia coli - MIC*    AMPICILLIN >=32 RESISTANT Resistant     CEFAZOLIN >=64 RESISTANT Resistant     CEFTRIAXONE >=64 RESISTANT Resistant     CIPROFLOXACIN >=4 RESISTANT Resistant     GENTAMICIN <=1 SENSITIVE Sensitive     IMIPENEM <=0.25 SENSITIVE Sensitive     NITROFURANTOIN <=16 SENSITIVE Sensitive     TRIMETH/SULFA <=20 SENSITIVE Sensitive     AMPICILLIN/SULBACTAM >=32 RESISTANT Resistant     PIP/TAZO 32 INTERMEDIATE Intermediate     Extended ESBL POSITIVE Resistant     * >=100,000 COLONIES/mL ESCHERICHIA COLI  MRSA PCR Screening     Status: None   Collection Time: 11/18/16  1:44 AM  Result Value Ref Range Status   MRSA by PCR NEGATIVE NEGATIVE Final    Comment:        The GeneXpert MRSA Assay (FDA approved for NASAL specimens only), is one component of a comprehensive MRSA colonization surveillance program. It is  not intended to diagnose MRSA infection nor to guide or monitor treatment for MRSA infections.   Culture, expectorated sputum-assessment     Status: None   Collection Time: 11/18/16  1:33 PM  Result Value Ref Range Status   Specimen Description EXPECTORATED SPUTUM  Final   Special Requests NONE  Final   Sputum evaluation   Final    Sputum specimen not acceptable for testing.  Please recollect.   RESULT CALLED TO, READ BACK BY AND VERIFIED WITH: L HITT 11/21/16 @ 0440 M VESTAL    Report Status 11/21/2016 FINAL  Final     Labs: BNP (last 3 results)  Recent Labs  07/27/16 1029 10/12/16 2302 11/01/16 1540  BNP 516.1* 313.4* 0000000*   Basic Metabolic Panel:  Recent Labs Lab 11/18/16 0009 11/18/16 0505 11/19/16 0437 11/21/16 0450 11/22/16 0442 11/23/16  0442  NA  --  139 140 138 142 141  K  --  4.1 4.2 4.0 4.2 4.2  CL  --  105 104 99* 102 102  CO2  --  27 29 34* 35* 34*  GLUCOSE  --  181* 255* 275* 257* 219*  BUN  --  35* 41* 41* 44* 40*  CREATININE  --  1.06* 1.24* 1.34* 1.30* 1.24*  CALCIUM  --  9.2 9.0 9.0 9.4 9.2  MG 2.0  --  1.8 1.9  --   --    Liver Function Tests:  Recent Labs Lab 11/18/16 0505 11/19/16 0437  AST 34 28  ALT 42 41  ALKPHOS 108 101  BILITOT 0.8 0.4  PROT 6.4* 6.0*  ALBUMIN 2.0* 1.9*   No results for input(s): LIPASE, AMYLASE in the last 168 hours. No results for input(s): AMMONIA in the last 168 hours. CBC:  Recent Labs Lab 11/17/16 1330 11/18/16 0505 11/19/16 0437 11/22/16 0442  WBC 6.8 4.4 4.5 4.4  NEUTROABS 5.0  --  2.9 2.1  HGB 9.2* 8.4* 8.4* 8.3*  HCT 27.7* 26.2* 25.7* 26.7*  MCV 89.6 91.0 91.8 94.7  PLT 248 231 230 216   Cardiac Enzymes: No results for input(s): CKTOTAL, CKMB, CKMBINDEX, TROPONINI in the last 168 hours. BNP: Invalid input(s): POCBNP CBG:  Recent Labs Lab 11/22/16 2029 11/22/16 2355 11/23/16 0425 11/23/16 0803 11/23/16 1217  GLUCAP 149* 156* 200* 240* 232*   D-Dimer No results for  input(s): DDIMER in the last 72 hours. Hgb A1c No results for input(s): HGBA1C in the last 72 hours. Lipid Profile No results for input(s): CHOL, HDL, LDLCALC, TRIG, CHOLHDL, LDLDIRECT in the last 72 hours. Thyroid function studies No results for input(s): TSH, T4TOTAL, T3FREE, THYROIDAB in the last 72 hours.  Invalid input(s): FREET3 Anemia work up No results for input(s): VITAMINB12, FOLATE, FERRITIN, TIBC, IRON, RETICCTPCT in the last 72 hours. Urinalysis    Component Value Date/Time   COLORURINE YELLOW 11/17/2016 1503   APPEARANCEUR CLOUDY (A) 11/17/2016 1503   LABSPEC 1.015 11/17/2016 1503   LABSPEC 1.020 12/22/2010 1142   PHURINE 8.5 (H) 11/17/2016 1503   GLUCOSEU NEGATIVE 11/17/2016 1503   HGBUR LARGE (A) 11/17/2016 1503   BILIRUBINUR NEGATIVE 11/17/2016 1503   BILIRUBINUR Negative 12/22/2010 1142   KETONESUR NEGATIVE 11/17/2016 1503   PROTEINUR 30 (A) 11/17/2016 1503   UROBILINOGEN 0.2 07/15/2015 1349   NITRITE NEGATIVE 11/17/2016 1503   LEUKOCYTESUR MODERATE (A) 11/17/2016 1503   LEUKOCYTESUR Negative 12/22/2010 1142   Sepsis Labs Invalid input(s): PROCALCITONIN,  WBC,  LACTICIDVEN Microbiology Recent Results (from the past 240 hour(s))  Urine culture     Status: Abnormal   Collection Time: 11/17/16  3:03 PM  Result Value Ref Range Status   Specimen Description URINE, CLEAN CATCH  Final   Special Requests NONE  Final   Culture (A)  Final    >=100,000 COLONIES/mL ESCHERICHIA COLI Confirmed Extended Spectrum Beta-Lactamase Producer (ESBL)    Report Status 11/19/2016 FINAL  Final   Organism ID, Bacteria ESCHERICHIA COLI (A)  Final      Susceptibility   Escherichia coli - MIC*    AMPICILLIN >=32 RESISTANT Resistant     CEFAZOLIN >=64 RESISTANT Resistant     CEFTRIAXONE >=64 RESISTANT Resistant     CIPROFLOXACIN >=4 RESISTANT Resistant     GENTAMICIN <=1 SENSITIVE Sensitive     IMIPENEM <=0.25 SENSITIVE Sensitive     NITROFURANTOIN <=16 SENSITIVE Sensitive  TRIMETH/SULFA <=20 SENSITIVE Sensitive     AMPICILLIN/SULBACTAM >=32 RESISTANT Resistant     PIP/TAZO 32 INTERMEDIATE Intermediate     Extended ESBL POSITIVE Resistant     * >=100,000 COLONIES/mL ESCHERICHIA COLI  MRSA PCR Screening     Status: None   Collection Time: 11/18/16  1:44 AM  Result Value Ref Range Status   MRSA by PCR NEGATIVE NEGATIVE Final    Comment:        The GeneXpert MRSA Assay (FDA approved for NASAL specimens only), is one component of a comprehensive MRSA colonization surveillance program. It is not intended to diagnose MRSA infection nor to guide or monitor treatment for MRSA infections.   Culture, expectorated sputum-assessment     Status: None   Collection Time: 11/18/16  1:33 PM  Result Value Ref Range Status   Specimen Description EXPECTORATED SPUTUM  Final   Special Requests NONE  Final   Sputum evaluation   Final    Sputum specimen not acceptable for testing.  Please recollect.   RESULT CALLED TO, READ BACK BY AND VERIFIED WITH: L HITT 11/21/16 @ 0440 M VESTAL    Report Status 11/21/2016 FINAL  Final     Time coordinating discharge: Over 30 minutes  SIGNED:  Dessa Phi, DO [facility] Pager [phone]  If 7PM-7AM, please contact night-coverage [email or web address] Password [email or web address] 11/23/2016, 1:43 PM

## 2016-11-23 NOTE — NC FL2 (Signed)
[place] LEVEL OF CARE SCREENING TOOL     IDENTIFICATION  Patient Name: Mandy Rhodes Birthdate: 1941-10-02 Sex: female Admission Date (Current Location): 11/17/2016  [facility] and [place] Number:  Herbalist and Address:  [facility], [place] [address] [place], [place] [address]      Provider Number: [id number]  Attending Physician Name and Address:  Shon Millet*  Relative Name and Phone Number:  Darnelle Spangle [phone]    Current Level of Care: Hospital Recommended Level of Care: [place] (Returning to [place]) Prior Approval Number:    Date Approved/Denied:   PASRR Number:    Discharge Plan: SNF    Current Diagnoses: Patient Active Problem List   Diagnosis Date Noted  . UTI due to extended-spectrum beta lactamase (ESBL) producing Escherichia coli 11/22/2016  . Dysphagia 11/01/2016  . Esophageal perforation   . Vertebral osteomyelitis ([place])   . Laryngeal mass 10/16/2016  . Anemia 10/16/2016  . Demand ischemia ([place]) 10/16/2016  . PAT (paroxysmal atrial tachycardia) ([place]) 10/14/2016  . Malnutrition of moderate degree 10/14/2016  . Acute respiratory failure with hypoxia ([place]) 10/13/2016  . Pressure injury of skin 10/13/2016  . Mass   . Acute kidney injury superimposed on CKD ([place])   . Acute on chronic combined systolic and diastolic congestive heart failure ([place])   . Edema 08/23/2016  . Non-ischemic cardiomyopathy ([place]) 07/14/2016  . Hyperlipidemia 07/14/2016  . Acute on chronic combined systolic and diastolic heart failure ([place]) 04/13/2016  . CKD (chronic kidney disease)   . Cardiomegaly 03/17/2016  . CKD (chronic kidney disease) stage 3, GFR 30-59 ml/min 03/17/2016  . Essential hypertension 03/17/2016  . Diabetes mellitus ([place]) 09/29/2011    Orientation RESPIRATION BLADDER Height & Weight     Self, Time, Situation, Place  O2 (2 Liters Oxygen) Incontinent  Weight: 196 lb 10.4 oz (89.2 kg) Height:  5\' 6"  (167.6 cm)  BEHAVIORAL SYMPTOMS/MOOD NEUROLOGICAL BOWEL NUTRITION STATUS      Incontinent Feeding tube (PEG Tube)  AMBULATORY STATUS COMMUNICATION OF NEEDS Skin   Total Care (Patient was not able to ambulate with PT during eval on 11/22/16) Verbally Other (Comment) (Stage 3 pressure ulcer to buttocks and sacrum. Incision left neck)                       Personal Care Assistance Level of Assistance  Bathing, Feeding, Dressing Bathing Assistance: Limited assistance Feeding assistance: Independent Dressing Assistance: Limited assistance     Functional Limitations Info  Sight, Hearing, Speech Sight Info: Impaired Hearing Info: Adequate Speech Info: Adequate    SPECIAL CARE FACTORS FREQUENCY        PT Frequency: Evaluated 1/3 and a minimum of 2X per week therapy recommended              Contractures Contractures Info: Not present    Additional Factors Info  Code Status, Allergies Code Status Info: DNR Allergies Info: Sulfa Antibiotics   Insulin Sliding Scale Info: 0-5 Units daily at bedtime; 0-9 Units 3X daily with meals       Current Medications (11/23/2016):  This is the current hospital active medication list Current Facility-Administered Medications  Medication Dose Route Frequency Provider Last Rate Last Dose  . aspirin chewable tablet 81 mg  81 mg Per Tube Daily Reyne Dumas, MD   81 mg at 11/23/16 1036  . enoxaparin (LOVENOX) injection 40 mg  40 mg Subcutaneous Q24H  Nita Sells, MD   40 mg at 11/22/16 2021  . feeding supplement (OSMOLITE 1.5 CAL) liquid 1,000 mL  1,000 mL Per Tube Continuous Lavina Hamman, MD 50 mL/hr at 11/23/16 1425 1,000 mL at 11/23/16 1425  . feeding supplement (PRO-STAT SUGAR FREE 64) liquid 30 mL  30 mL Per Tube Daily Lavina Hamman, MD   30 mL at 11/23/16 1036  . free water 100 mL  100 mL Per Tube Q8H Nita Sells, MD   100 mL at 11/23/16 1400  . furosemide (LASIX) 10 MG/ML  solution 20 mg  20 mg Per Tube Daily Shon Millet, DO   20 mg at 11/23/16 1036  . insulin aspart (novoLOG) injection 0-5 Units  0-5 Units Subcutaneous QHS Eber Jones, MD   2 Units at 11/21/16 2111  . insulin aspart (novoLOG) injection 0-9 Units  0-9 Units Subcutaneous TID WC Eber Jones, MD   3 Units at 11/23/16 1240  . insulin glargine (LANTUS) injection 12 Units  12 Units Subcutaneous QHS Nita Sells, MD   12 Units at 11/22/16 2040  . metoprolol tartrate (LOPRESSOR) tablet 25 mg  25 mg Per Tube BID Lavina Hamman, MD   25 mg at 11/23/16 1036  . nortriptyline (PAMELOR) capsule 10 mg  10 mg Per Tube QHS Lavina Hamman, MD   10 mg at 11/22/16 2021  . pantoprazole sodium (PROTONIX) 40 mg/20 mL oral suspension 40 mg  40 mg Per Tube Daily Lavina Hamman, MD   40 mg at 11/23/16 1036  . sodium chloride flush (NS) 0.9 % injection 10-40 mL  10-40 mL Intracatheter Q12H Nita Sells, MD   10 mL at 11/22/16 2022  . sodium chloride flush (NS) 0.9 % injection 10-40 mL  10-40 mL Intracatheter PRN Nita Sells, MD   10 mL at 11/20/16 0835  . sodium chloride flush (NS) 0.9 % injection 10-40 mL  10-40 mL Intracatheter PRN Lavina Hamman, MD   10 mL at 11/23/16 0444  . sodium chloride flush (NS) 0.9 % injection 3 mL  3 mL Intravenous Q12H Nita Sells, MD   3 mL at 11/22/16 2022     Discharge Medications: Please see discharge summary for a list of discharge medications.  Relevant Imaging Results:  Relevant Lab Results:   Additional Information ss#[id number]  Sable Feil, LCSW

## 2016-11-23 NOTE — Care Management Important Message (Signed)
Important Message  Patient Details  Name: ELIZBETH GAUTHIER MRN: [id number] Date of Birth: 05-08-1941   Medicare Important Message Given:  Yes    Tryphena Perkovich, Rory Percy, RN 11/23/2016, 4:29 PM

## 2016-11-23 NOTE — Clinical Social Work Note (Signed)
Mandy Rhodes is medically stable for discharge and is returning to [facility] this evening. Discharge clinicals transmitted to facility and patient's cousin, Mandy Rhodes at the bedside and also aware of discharge. When asked, Mandy Rhodes did not want any other family contacted regarding her discharge. Patient will be transported by ambulance.  Jordie Schreur Givens, MSW, LCSW Licensed Clinical Social Worker [place] [phone]

## 2016-11-23 NOTE — Progress Notes (Signed)
Nutrition Follow-up  DOCUMENTATION CODES:   Obesity unspecified  INTERVENTION:  Osmolite 1.5 @ 50 ml/hr via PEG with 30 ml pro-stat once daily to provide 1900 kcal, 90 grams of protein, and 912 ml of water.   Continue free water flushes.  Plans for discharge back to SNF.   NUTRITION DIAGNOSIS:   Inadequate oral intake related to inability to eat as evidenced by NPO status; ongoing  GOAL:   Patient will meet greater than or equal to 90% of their needs; met  MONITOR:   TF tolerance, Skin, Labs, I & O's, Weight trends  REASON FOR ASSESSMENT:   Consult Enteral/tube feeding initiation and management  ASSESSMENT:   76 year old female, [facility] with history of CHF, CKD II/III, and T2DM. 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 Sent over from the nursing facility 12/29 because of confusion and altered mental status  Pt has been tolerating her tube feeds. Weight has been stable since admission. Plans for discharge back to SNF. Nutrition-Focused physical exam completed. Findings are no fat depletion, mild to moderate muscle depletion, and mild edema.   Labs and medications reviewed.   Diet Order:  Diet NPO time specified  Skin:  Wound (see comment) (Stage III to buttocks and sacrum)  Last BM:  1/4  Height:   Ht Readings from Last 1 Encounters:  11/17/16 5' 6" (1.676 m)    Weight:   Wt Readings from Last 1 Encounters:  11/23/16 196 lb 10.4 oz (89.2 kg)    Ideal Body Weight:  59.09 kg  BMI:  Body mass index is 31.74 kg/m.  Estimated Nutritional Needs:   Kcal:  1700-1900  Protein:  90-105 grams  Fluid:  2.1 L/day  EDUCATION NEEDS:   No education needs identified at this time  Corrin Parker, MS, RD, LDN Pager # [phone] After hours/ weekend pager # [phone]

## 2016-11-23 NOTE — Progress Notes (Signed)
Inpatient Diabetes Program Recommendations  AACE/ADA: New Consensus Statement on Inpatient Glycemic Control (2015)  Target Ranges:  Prepandial:   less than 140 mg/dL      Peak postprandial:   less than 180 mg/dL (1-2 hours)      Critically ill patients:  140 - 180 mg/dL   Results for JULIAH, GRADILLAS (MRN [id number]) as of 11/23/2016 09:46  Ref. Range 11/22/2016 07:54 11/22/2016 12:27 11/22/2016 17:14 11/22/2016 20:29 11/22/2016 23:55 11/23/2016 04:25 11/23/2016 08:03  Glucose-Capillary Latest Ref Range: 65 - 99 mg/dL 241 (H) 262 (H) 191 (H) 149 (H) 156 (H) 200 (H) 240 (H)   Review of Glycemic Control  Diabetes history: DM2 Outpatient Diabetes medications: Lantus 12 units QHS Current orders for Inpatient glycemic control: Lantus 12 units QHS, Novolog 0-9 units TID with meals, Novolog 0-5 units QHS  Inpatient Diabetes Program Recommendations:  Insulin-Correction: Please consider adjusting frequency of Correction to Q4 hours. Insulin-Tube Feed Coverage: Please consider ordering Novolog 3 units Q4 hours for tube feeding coverage. Should be held if tube feeding stopped or held.  Thanks, Barnie Alderman, RN, MSN, CDE Diabetes Coordinator Inpatient Diabetes Program [phone] (Team Pager from 8am to 5pm)

## 2016-11-23 NOTE — Consult Note (Addendum)
   [facility] CM Inpatient Consult   11/23/2016  Mandy Rhodes 11-07-1941 [id number]    [facility] Care Management follow up. Spoke with [place] Coordinator about Mandy Rhodes. Made aware that Mandy Rhodes was followed by [facility] recently. Mandy Rhodes is showing up as active with [facility] Management services. Spoke with Mandy Rhodes at bedside who states she is interested in ongoing follow up from [place] Management program. She also endorses that she will return to [facility] at discharge. Agreeable to [facility] Licensed CSW follow up while at [facility]. Mandy Rhodes has had multiple admissions and has multiple co-morbities. Made inpatient RNCM aware who states she may discharge back to SNF today.  Will request for Mandy Rhodes to be assigned to [facility] Licensed CSW. Active [facility] Care Management consent on file.    Marthenia Rolling, MSN-Ed, RN,BSN [facility] Liaison [phone]

## 2016-11-23 NOTE — Clinical Social Work Note (Signed)
Clinical Social Work Assessment  Patient Details  Name: Mandy Rhodes MRN: [id number] Date of Birth: June 30, 1941  Date of referral:  11/22/16               Reason for consult:  Facility Placement                Permission sought to share information with:  Family Supports Permission granted to share information::  No  Name::     Armanda Magic  Agency::     Relationship::  Cousin  Contact Information:  Ms. Theodis Shove in room with patient on 11/23/16  Housing/Transportation Living arrangements for the past 2 months:  [place] ([facility] [employer] [facility] Source of Information:  Patient Patient Interpreter Needed:  None Criminal Activity/Legal Involvement Pertinent to Current Situation/Hospitalization:  No - Comment as needed Significant Relationships:  Other Family Members Lives with:  Facility Resident (Currently at [facility]) Do you feel safe going back to the place where you live?  Yes Need for family participation in patient care:  Yes (Comment)  Care giving concerns:  Patient expressed no concerns regarding her care at [place].   Social Worker assessment / plan:  CSW visited room and talked with patient regarding her discharge disposition. Ms. Bieker confirmed that she will return to [facility] SNF to continue rehab.  Employment status:  Retired Forensic scientist:    PT Recommendations:  [place] / Referral to community resources:  Other (Comment Required) (None needed or requested as patient from facility)  Patient/Family's Response to care:  No concerns expressed regarding care during hospitalization.  Patient/Family's Understanding of and Emotional Response to Diagnosis, Current Treatment, and Prognosis:  Not discussed.  Emotional Assessment Appearance:  Appears stated age Attitude/Demeanor/Rapport:  Other (Appropriate) Affect (typically observed):  Appropriate Orientation:  Oriented to Self,  Oriented to Place, Oriented to  Time, Oriented to Situation Alcohol / Substance use:    Psych involvement (Current and /or in the community):  No (Comment)  Discharge Needs  Concerns to be addressed:  No discharge needs identified Readmission within the last 30 days:  Yes Current discharge risk:  None Barriers to Discharge:  No Barriers Identified   Sable Feil, LCSW 11/23/2016, 6:17 PM

## 2016-11-23 NOTE — Progress Notes (Signed)
Report given to [facility], [employer].

## 2016-11-24 ENCOUNTER — Encounter: Payer: Self-pay | Admitting: Internal Medicine

## 2016-11-24 ENCOUNTER — Non-Acute Institutional Stay (SKILLED_NURSING_FACILITY): Payer: Medicare Other | Admitting: Internal Medicine

## 2016-11-24 DIAGNOSIS — A498 Other bacterial infections of unspecified site: Secondary | ICD-10-CM

## 2016-11-24 DIAGNOSIS — Z1612 Extended spectrum beta lactamase (ESBL) resistance: Secondary | ICD-10-CM | POA: Diagnosis not present

## 2016-11-24 DIAGNOSIS — I5043 Acute on chronic combined systolic (congestive) and diastolic (congestive) heart failure: Secondary | ICD-10-CM | POA: Diagnosis not present

## 2016-11-24 DIAGNOSIS — N390 Urinary tract infection, site not specified: Secondary | ICD-10-CM | POA: Diagnosis not present

## 2016-11-24 DIAGNOSIS — M4620 Osteomyelitis of vertebra, site unspecified: Secondary | ICD-10-CM | POA: Diagnosis not present

## 2016-11-24 DIAGNOSIS — Z8659 Personal history of other mental and behavioral disorders: Secondary | ICD-10-CM | POA: Diagnosis not present

## 2016-11-24 DIAGNOSIS — B9629 Other Escherichia coli [E. coli] as the cause of diseases classified elsewhere: Secondary | ICD-10-CM

## 2016-11-24 NOTE — Progress Notes (Signed)
This is a nursing facility follow up for Mandy Rhodes readmission within 30 days  Interim medical record and care since last [place] visit was updated with review of diagnostic studies and change in clinical status since last visit were documented.  HPI: She was readmitted to the hospital 12 /29/17 -11/23/16 for confusion with acute mental status changes and acute CHF with respiratory failure.  The patient confirms to me that prior to admission she felt as if she were "straight up in bed." She also described frank vertigo. She received IV Lasix and then transitioned to PEG dosing.ESBL Escherichia coli UTI was treated with Merrem then fosfomycin. At discharge weight was 89.2 kg. She had previously been hospitalized with infected cervical hardware. NS 1/3 follow-up appointment was not completed because of the hospitalization. PICC line was removed prior to discharge Low-dose Lantus was continued for diabetes. A1c was 7.1 indicating good control.  Review of systems: She has some itching of the left eye but no other extrinsic symptoms. She's had some loose stool without frank diarrhea. She states that she has a wound on her buttocks and is requesting an air mattress. She is on nortriptyline for numbness and tingling in her lower extremities. Constitutional: No fever,significant weight change, fatigue  Eyes: No redness, discharge, pain, vision change ENT/mouth: No nasal congestion,  purulent discharge, earache,change in hearing ,sore throat  Cardiovascular: No chest pain, palpitations,paroxysmal nocturnal dyspnea, claudication, edema  Respiratory: No cough, sputum production,hemoptysis, DOE , significant snoring,apnea  Gastrointestinal: No heartburn,dysphagia,abdominal pain, nausea / vomiting,rectal bleeding, melena Genitourinary: No dysuria,hematuria, pyuria,  incontinence, nocturia Musculoskeletal: No joint stiffness, joint swelling, weakness,pain Dermatologic: No rash,  pruritus Neurologic: No dizziness,headache,syncope, seizures,vertigo now Psychiatric: No significant anxiety , depression, insomnia, anorexia Endocrine: No change in hair/skin/ nails, excessive thirst, excessive hunger, excessive urination  Hematologic/lymphatic: No significant bruising, lymphadenopathy,abnormal bleeding Allergy/immunology: No watery eyes, significant sneezing, urticaria, angioedema  Physical exam:  Pertinent or positive findings: She is alert and oriented. Ptosis and esotropia present in the left eye. Extraocular motion intact without nystagmus. She has wax in otic canals. The maxilla is edentulous. Posterior tibial pulses are decreased. The right great toenail is thickened and deformed. Isolated flexion contractures of left toes. Tips of her fingers and the soles of her feet are dry. She has fusiform changes of the knees. There is decreased range of motion of the right upper extremity. A dressing is present over the right biceps where she had a PICC line placed.  General appearance:Adequately nourished; no acute distress , increased work of breathing is present.   Lymphatic: No lymphadenopathy about the head, neck, axilla . Eyes: No conjunctival inflammation or lid edema is present. There is no scleral icterus. Ears:  External ear exam shows no significant lesions or deformities.   Nose:  External nasal examination shows no deformity or inflammation. Nasal mucosa are pink and moist without lesions ,exudates Oral exam: lips and gums are healthy appearing.There is no oropharyngeal erythema or exudate . Neck:  No thyromegaly, masses, tenderness noted.    Heart:  Normal rate and regular rhythm. S1 and S2 normal without gallop, murmur, click, rub .  Lungs:Chest clear to auscultation without wheezes, rhonchi,rales , rubs. Abdomen:Bowel sounds are normal. Abdomen is soft and nontender with no organomegaly, hernias,masses. GU: deferred  Extremities:  No cyanosis, clubbing,edema    Neurologic exam : Balance,Rhomberg,finger to nose testing could not be completed due to clinical state Skin: Warm & dry w/o tenting. No significant  rash.  See  summary under each active problem in the Problem List with associated updated therapeutic plan

## 2016-11-24 NOTE — Assessment & Plan Note (Deleted)
Continue diuretic and monitor weight closely

## 2016-11-24 NOTE — Assessment & Plan Note (Signed)
Neurosurgery followup appointment requested

## 2016-11-24 NOTE — Patient Instructions (Signed)
See Current Assessment & Plan in Problem List under specific Diagnosis 

## 2016-11-24 NOTE — Assessment & Plan Note (Signed)
Continue diuretic and monitor weight closely

## 2016-11-24 NOTE — Assessment & Plan Note (Signed)
11/24/16 Completely resoved

## 2016-11-24 NOTE — Assessment & Plan Note (Signed)
Antibiotic course completed. 

## 2016-11-30 ENCOUNTER — Other Ambulatory Visit: Payer: Self-pay | Admitting: *Deleted

## 2016-11-30 ENCOUNTER — Encounter: Payer: Self-pay | Admitting: *Deleted

## 2016-11-30 ENCOUNTER — Ambulatory Visit: Payer: Self-pay | Admitting: *Deleted

## 2016-11-30 NOTE — Patient Outreach (Signed)
[facility]) Care Management  11/30/2016  GAETANA KAWAHARA 1941-06-01 [id number]   On 11/28/16 this RNCM met with patient at bedside, she states that she is slowly progressing. She does plan to return home, but she has "a long way to go"  before she can return home. She states that she does not want to remain LTC at facility, but knows her family has started some financial paperwork to assist with paying for her skilled stay. When patient goes home she does wish to continue [facility] program services.   On 11/30/16, call from [facility], SW at facility, she states that patient is there under Medicare Skilled, she states that family is working on [employer] as patient is in co-pay days now, she is not sure if patient will remain LTC or not at this current time.  Plan:  RNCM will continue to follow patient for discharge planning needs and continue with updates.  If patient to remain LTC will make [facility] care team aware and will sign off case.    Royetta Crochet. Laymond Purser, RN, BSN, [place] [phone]) Business Cell  [phone]) Toll Free Office

## 2016-11-30 NOTE — Patient Outreach (Signed)
[facility]) Care Management  11/30/2016  Mandy Rhodes 12-19-1940 [id number]   CSW was able to make contact with patient today to perform the initial assessment, as well as assess and assist with social work needs and services, when CSW met with patient at [facility], where patient currently resides to receive short-term rehabilitative services.  CSW introduced self, explained role and types of services provided through [place] Management ([place] Management).  CSW further explained to patient that CSW works with Burgess Amor, [place], also with [place] [facility]. CSW then explained to patient that she was actually able to meet with Mrs. Niemczura earlier in the day.  Patient voiced understanding and was pleased with the visit.  CSW obtained two HIPAA compliant identifiers from patient, which included patient's name and date of birth. CSW is aware that patient is anxious to return home to live independently at time of discharge, but patient verbalized understanding of needing several more weeks of therapies (both physical and occupational) before being able to do so.  Patient stated, "They told me I'd be here for 20 days or more".  Patient admits to having a good support system, naming her son's, Cletis Athens and Myrna Blazer, as well as daughter-in-law's, Lala Lund and Catarina Hartshorn.  Patient is aware that Rodena Piety is currently working on applying for [facility] for her, in the event that patient may require 24 hour care and supervision in an assisted living facility, in the near future. CSW agreed to continue to follow patient while at [facility] to assist with discharge planning needs and services.  CSW explained to patient that if she plans to return home, patient will more than likely require home health services, as well as durable medical equipment.  CSW agreed to assist with these arrangements.   CSW will meet with patient for the next routine visit at [facility] on Thursday, January 25th.  Patient has CSW's contact information and has been encouraged to contact CSW directly if social work services are needed in the meantime. Nat Christen, BSW, MSW, LCSW  Licensed Education officer, environmental Health System  Mailing [place] [address] [place], [place] [address] Physical Address-[address], [place], [place] [address] Toll Free Main # [phone] Fax # [phone] Cell # [phone]  Fax # [phone]  [email or web address]

## 2016-12-14 ENCOUNTER — Other Ambulatory Visit: Payer: Self-pay | Admitting: *Deleted

## 2016-12-14 NOTE — Patient Outreach (Signed)
[facility]) Care Management  12/14/2016  Mandy Rhodes 09-Jun-1941 [id number]   CSW was able to meet with patient at [facility] today, [place] where patient currently resides to receive short-term rehabilitative services.  Patient was very pleasant and in good spirits today, sitting up in bed, watching television.  Patient indicated that she was "tired", after having already worked with therapies, both physical and occupational, earlier that morning.  Patient still denies having a tentative discharge date scheduled at this time, reporting "I suspect I'll be here a while".  Patient then stated, "Would it be alright if I call you when I know I'm being discharged so you can help me make sure I have everything I need? CSW encouraged patient to do so, explaining that CSW would like to continue to follow patient while at [facility], just to ensure that all her needs are being met while she is there, as well as assist with discharge planning needs and services.  Patient voiced understanding and was agreeable to this plan.  CSW will plan to meet with patient for the next scheduled visit on February 8th. Mandy Rhodes, BSW, MSW, LCSW  Licensed Education officer, environmental Health System  Mailing [place] [address], , [place] [address] Physical Address-[address], [place], [place] [address] Toll Free Main # [phone] Fax # [phone] Cell # [phone]  Office # [phone] [email or web address]

## 2016-12-19 ENCOUNTER — Other Ambulatory Visit: Payer: Self-pay | Admitting: *Deleted

## 2016-12-19 NOTE — Patient Outreach (Signed)
Spoke with [name], SW at facility for update on patient.  She states patient is slowing progressing, goal is to return home, but she states that she needs to set up meeting with patient and daughter, she states that daughter had planned to apply for LTC Medicaid to at least assist with co-pays for SNF stay and for any extra days patient may need after skilled stay is completed. She will follow up with daughter and patient at meeting.  Reminded Tillie Rung that this patient is active with [facility] care management services and requested she let this RNCM know of discharge planning needs to collaborate with [facility] care management team.   Plan to continue to follow for discharge planning needs.  Royetta Crochet. Laymond Purser, RN, BSN, [place] [phone]) Business Cell  [phone]) Toll Free Office

## 2016-12-21 ENCOUNTER — Other Ambulatory Visit: Payer: Self-pay | Admitting: Neurosurgery

## 2016-12-22 ENCOUNTER — Other Ambulatory Visit (HOSPITAL_COMMUNITY): Payer: Self-pay | Admitting: Internal Medicine

## 2016-12-22 DIAGNOSIS — R1310 Dysphagia, unspecified: Secondary | ICD-10-CM

## 2016-12-25 ENCOUNTER — Ambulatory Visit (HOSPITAL_COMMUNITY)
Admission: RE | Admit: 2016-12-25 | Discharge: 2016-12-25 | Disposition: A | Discharge status: Home / Self Care | Payer: PRIVATE HEALTH INSURANCE | Source: Ambulatory Visit | Attending: Internal Medicine | Admitting: Internal Medicine

## 2016-12-25 DIAGNOSIS — R1310 Dysphagia, unspecified: Secondary | ICD-10-CM | POA: Insufficient documentation

## 2016-12-26 ENCOUNTER — Non-Acute Institutional Stay (SKILLED_NURSING_FACILITY): Payer: Medicare Other | Admitting: Internal Medicine

## 2016-12-26 ENCOUNTER — Encounter: Payer: Self-pay | Admitting: Internal Medicine

## 2016-12-26 DIAGNOSIS — R1310 Dysphagia, unspecified: Secondary | ICD-10-CM

## 2016-12-26 NOTE — Patient Instructions (Signed)
See assessment and plan under  diagnosis in the problem list

## 2016-12-26 NOTE — Assessment & Plan Note (Addendum)
Speech therapy and Nutrition will continue to consult. Once adequate nutritional intake is established orally, referral will be made for the PEG tube to be removed by Interventional Radiology.

## 2016-12-26 NOTE — Progress Notes (Signed)
    Facility Location: [facility]  Room Number: 115 A  Code Status: Full Code  This is a nursing facility follow up of dysphagia.  Interim medical record and care since last [place] visit was updated with review of diagnostic studies and change in clinical status since last visit were documented.  HPI: The patient continues to receive PEG tube feedings. The swallowing study 2/5 revealed penetration to the cords with thin liquids & flash laryngeal penetration with thick liquids. The barium tablet  stuck in the vallecula. She is being evaluated for possible discontinuation of tube  feedings. She has intermittent dysphagia particularly with foods such as [place]. One issue is difficulty chewing as her gums have "shrunk" & the upper plate no longer fits. She does plan to get a fixative such as Poligrip.  Review of systems: She describes intermittent low back pain but this is improving and not aggravated by PT/OT.  She denies active cardiopulmonary symptoms.  No fever,significant weight change, fatigue  ENT/mouth: No nasal congestion,  purulent discharge, earache,change in hearing ,sore throat  Cardiovascular: No chest pain, palpitations,paroxysmal nocturnal dyspnea, claudication, edema  Respiratory: No cough, sputum production,hemoptysis, DOE , significant snoring,apnea   Gastrointestinal: No heartburn,abdominal pain, nausea / vomiting,rectal bleeding, melena,change in bowels Genitourinary: No dysuria,hematuria, pyuria,  incontinence, nocturia Psychiatric: No significant anxiety , depression, insomnia, anorexia Allergy/immunology: No itchy/ watery eyes, significant sneezing, urticaria, angioedema  Physical exam:  Pertinent or positive findings:Ptosis and intermittent exotropia present on the left. The maxilla is edentulous. She has a lower partial.  PEG is in place. She has decreased range of motion of the upper extremities, especially on the right.    She has 1+ edema at the sock line.   General appearance:Adequately nourished; no acute distress , increased work of breathing is present.   Lymphatic: No lymphadenopathy about the head, neck, axilla . Eyes: No conjunctival inflammation or lid edema is present. There is no scleral icterus. Ears:  External ear exam shows no significant lesions or deformities.   Nose:  External nasal examination shows no deformity or inflammation. Nasal mucosa are pink and moist without lesions ,exudates Oral exam: lips and gums are healthy appearing.There is no oropharyngeal erythema or exudate . Neck:  No thyromegaly, masses, tenderness noted.    Heart:  Normal rate and regular rhythm. S1 and S2 normal without gallop, murmur, click, rub .  Lungs:Chest clear to auscultation without wheezes, rhonchi,rales , rubs. Abdomen:Bowel sounds are normal. Abdomen is soft and nontender with no organomegaly, hernias,masses. GU: deferred  Extremities:  No cyanosis Skin: Warm & dry w/o tenting. No significant lesions or rash.    See summary under each active problem in the Problem List with associated updated therapeutic plan

## 2016-12-28 ENCOUNTER — Ambulatory Visit: Payer: Self-pay | Admitting: *Deleted

## 2016-12-28 ENCOUNTER — Other Ambulatory Visit: Payer: Self-pay | Admitting: *Deleted

## 2016-12-28 NOTE — Patient Outreach (Signed)
[facility]) Care Management  12/28/2016  DAYJAH CONTEE 05-20-1941 [id number]   CSW was able to meet with patient today at [facility] [place] where patient currently resides to receive short-term rehabilitative services.  Patient admits to slow progression with therapies (both physical and occupational), but believes she is getting stronger and feeling less fatigued.  Patient is also working with speech therapy and nutrition on trying to establish adequate oral intake.  Patient is optimistic that her PEG tube will be removed soon. Patient indicated that she is in no real hurry to leave the skilled facility, understanding that she needs to be able to perform all activities of daily living independently, prior to returning home to live with her daughter-in-law, Lala Lund. Patient is agreeable to having CSW arrange home health services for her, requesting that these services be made with [facility].  Patient reported that she "believes" she has used [facility] in the past and was pleased with the services provided. CSW explained to patient that CSW will be leaving to go on vacation at 5:00pm on Wednesday, February 14th, not returning until Monday, February 26th, at which time CSW agreed to meet with patient at 9:00am at [facility] to perform a routine visit.  Patient voiced understanding and was agreeable to this plan.  Patient has CSW's contact information and has been encouraged to contact CSW directly if social work services are needed in the meantime. Nat Christen, BSW, MSW, LCSW  Licensed Education officer, environmental Health System  Mailing [place] [address] [place], [place] [address] Physical Address-[address], [place], [place] [address] Toll Free Main # [phone] Fax # [phone] Cell # [phone]  Office #  [phone] [email or web address]

## 2017-01-05 ENCOUNTER — Other Ambulatory Visit: Payer: Self-pay | Admitting: *Deleted

## 2017-01-05 LAB — HM DIABETES FOOT EXAM

## 2017-01-05 NOTE — Patient Outreach (Signed)
[facility]) Care Management  01/05/2017  Mandy Rhodes May 18, 1941 [id number]   Spoke with Tillie Rung, SW at facility regarding patient. She reports patient does intend to go home, and patient is doing better but is not quite ready to discharge. Patient will remain at facility after SNF days are over, under Medicaid or private pay until she is able to go home safely. Tillie Rung is aware that [facility] care management is active with patient.   Unable to meet with patient, she was not in her room.   Plan to monitor for discharge.  Royetta Crochet. Laymond Purser, RN, BSN, [place] [phone]) Business Cell  [phone]) Toll Free Office

## 2017-01-12 ENCOUNTER — Other Ambulatory Visit: Payer: Self-pay | Admitting: *Deleted

## 2017-01-12 NOTE — Patient Outreach (Signed)
[facility]) Care Management  01/12/2017  TULLY BURGO 1941/05/14 [id number]   Met with [name], SW at facility regarding patient discharge plans. She reports that patient intends to remain LTC at facility with no plan to discharge at this time. RNCM instructed SW that patient is eligible for [facility] care management if patient does discharge and to please re-consult RNCM to meet with patient.  Plan to sign off and will inform [facility] care team that patient will remain LTC at facility at this time with no plans to discharge.  Royetta Crochet. Laymond Purser, RN, BSN, [place] [phone]) Business Cell  [phone]) Toll Free Office

## 2017-01-15 ENCOUNTER — Other Ambulatory Visit: Payer: Self-pay | Admitting: *Deleted

## 2017-01-15 ENCOUNTER — Encounter: Payer: Self-pay | Admitting: *Deleted

## 2017-01-15 NOTE — Patient Outreach (Signed)
[facility]) Care Management  01/15/2017  Mandy Rhodes July 01, 1941 [id number]   CSW was able to meet with patient today at [facility] [place] where patient currently resides to receive rehabilitative services, to confirm that patient plans to remain at the skilled facility for long-term care services.  CSW had received an In [employer] from Burgess Amor, [place] with [facility] Management indicating that she had spoke with patient, as well as [name], CSW at [facility], to confirm that patient would not be discharging from the facility, but remaining there for long-term care. CSW will perform a case closure on patient, as all goals of treatment have been met from social work standpoint and no additional social work needs have been identified at this time.  CSW will notify patient's RNCM with [facility] Management, Burgess Amor of CSW's plans to close patient's case.  CSW will fax an update to patient's Primary Care Physician, Dr. Harlan Stains to ensure that they are aware of CSW's involvement with patient's plan of care.  CSW will submit a case closure request to Josepha Pigg, Care Management Assistant with [facility] Management, in the form of an In [employer].   Nat Christen, BSW, MSW, LCSW  Licensed Education officer, environmental Health System  Mailing [place] [address] [place], [place] [address] Physical Address-[address], [place], [place] [address] Toll Free Main # [phone] Fax # [phone] Cell # [phone]  Office # [phone] [email or web address]

## 2017-01-26 ENCOUNTER — Non-Acute Institutional Stay (SKILLED_NURSING_FACILITY): Payer: Medicare Other | Admitting: Nurse Practitioner

## 2017-01-26 ENCOUNTER — Encounter: Payer: Self-pay | Admitting: Nurse Practitioner

## 2017-01-26 DIAGNOSIS — G2581 Restless legs syndrome: Secondary | ICD-10-CM

## 2017-01-26 NOTE — Progress Notes (Signed)
Nursing Home Location:  [facility]   Place of Service: SNF (31)  PCP: Vidal Schwalbe, MD  Allergies  Allergen Reactions  . Sulfa Antibiotics Swelling    Chief Complaint  Patient presents with  . Acute Visit    Resident is being seen for medication clarification     HPI:  Patient is a 76 y.o. female seen today at [facility] for medication clarification. Staff reports pt did not want to take nortriptyline because she was not depressed. Pt with hx of systolicCHF, DM type 2, CKD stage 2-3, recent admission to [facility] due for infected cervical hardware with retropharyngeal abscess-status post exploration and irrigation debridement of prevertebral abscess and removal of hardware, status post short-term PEG tube. Pt at [facility] for rehab.  Pt reports she is taking the nortriptyline for restless leg syndrome and has been on it for that for years. Symptoms well controlled on this medication and does not wish to stop.     Review of Systems:  Review of Systems  Constitutional: Negative for activity change, appetite change, fatigue and fever.  Psychiatric/Behavioral: Negative for agitation, behavioral problems, confusion, dysphoric mood and sleep disturbance.    Past Medical History:  Diagnosis Date  . Anemia   . Arthritis   . CAD (coronary artery disease)    a. minimal CAD by cath in 03/2016  . CHF (congestive heart failure) ([place])    a. 09/2016: Echo with EF of 30-35%, moderate diffuse HK, Grade 2 DD, PA peak pressure of 39 mm Hg.  Marland Kitchen Chronic kidney disease    Renal Insuffiency, see [facility] once a year  . Diabetes mellitus    type 2  . GERD (gastroesophageal reflux disease)   . History of rotator cuff tear    Torn right rotator cuff.  . History of spinal stenosis   . Hypertension   . Infiltrative cardiomyopathy ([place])    a, presumed cardiac amyloidosis by MRI with prior nondiagnostic fat pad biopsy  . Neuropathy associated with endocrine  disorder [facility])    Past Surgical History:  Procedure Laterality Date  . ABDOMINAL HYSTERECTOMY  1971   Partial  . ANTERIOR CERVICAL DECOMP/DISCECTOMY FUSION N/A 10/18/2016   Procedure: debridement and drainage cervical prevertebral abscess  ANTERIOR CERVICAL HARDWARE REMOVAL exploration cervical fusion;  Surgeon: Earnie Larsson, MD;  Location: [facility];  Service: Neurosurgery;  Laterality: N/A;  . APPENDECTOMY    . BACK SURGERY  2009   Disectomy  . CARDIAC CATHETERIZATION N/A 03/22/2016   Procedure: Left Heart Cath and Coronary Angiography;  Surgeon: Peter M Martinique, MD;  Location: [facility] CV LAB;  Service: Cardiovascular;  Laterality: N/A;  . CHOLECYSTECTOMY  1969   Status post   . CYST REMOVAL TRUNK Right 12/03/2014   Procedure: EXCISION OF RIGHT BACK CYST ;  Surgeon: Coralie Keens, MD;  Location: [place];  Service: General;  Laterality: Right;  . DIRECT LARYNGOSCOPY N/A 10/17/2016   Procedure: DIRECT LARYNGOSCOPY,  ESOPHAGOSCOPY;  Surgeon: [name], MD;  Location: [facility];  Service: ENT;  Laterality: N/A;  . FOOT SURGERY Bilateral    bone removed  . IR GENERIC HISTORICAL  10/25/2016   IR GASTROSTOMY TUBE MOD SED 10/25/2016 [name], MD [facility]-INTERV RAD  . NEUROPLASTY / TRANSPOSITION MEDIAN NERVE AT CARPAL TUNNEL Bilateral    Hx  Left carpal tunnel repair  . TRACHEOSTOMY TUBE PLACEMENT N/A 10/17/2016   Procedure: TRACHEOSTOMY;  Surgeon: [name], MD;  Location: [facility];  Service: ENT;  Laterality: N/A;   Social History:   reports that she has quit smoking. She has never used smokeless tobacco. She reports that she does not drink alcohol or use drugs.  Family History  Problem Relation Age of Onset  . Diabetes type II Mother   . Hypertension Father   . Diabetes type II Sister   . Ovarian cancer Sister   . Diabetes type II Other   . Stroke Neg Hx   . CAD Neg Hx   . Heart failure Neg Hx     Medications: Patient's Medications  New Prescriptions   No medications on file    Previous Medications   AMINO ACIDS-PROTEIN HYDROLYS (FEEDING SUPPLEMENT, PRO-STAT SUGAR FREE 64,) LIQD    Place 30 mLs into feeding tube daily. FLUSH WITH 30 ML'S WATER BEFORE AND AFTER MEDICATIONS   ASPIRIN 81 MG CHEWABLE TABLET    Place 81 mg into feeding tube daily.   BISACODYL (DULCOLAX) 10 MG SUPPOSITORY    Place 10 mg rectally once as needed (if no relief from Milk of Magnesia).   CALCIUM CARB-CHOLECALCIFEROL (CALCIUM 600 + D PO)    Place 1 tablet into feeding tube every morning.    CHOLECALCIFEROL (VITAMIN D) 1000 UNITS TABLET    1,000 Units See admin instructions. Per feeding tube once a day   FERROUS SULFATE 325 (65 FE) MG EC TABLET    325 mg See admin instructions. Per feeding tube in the morning   FUROSEMIDE (LASIX) 10 MG/ML SOLUTION    Take 20 mg by mouth daily.   INSULIN GLARGINE (LANTUS SOLOSTAR) 100 UNIT/ML SOLOSTAR PEN    Inject 12 Units into the skin daily.   IRBESARTAN (AVAPRO) 75 MG TABLET    Place 37.5 mg into feeding tube daily.   MAGNESIUM HYDROXIDE (MILK OF MAGNESIA) 400 MG/5ML SUSPENSION    Place 30 mLs into feeding tube once as needed (if no bowel movement in 3 days).    METOPROLOL TARTRATE (LOPRESSOR) 25 MG TABLET    Place 1 tablet (25 mg total) into feeding tube 2 (two) times daily.   MULTIPLE VITAMIN (TAB-A-VITE) TABS    Place 1 tablet into feeding tube every morning.   NORTRIPTYLINE (PAMELOR) 10 MG CAPSULE    Place 10 mg into feeding tube at bedtime.    NUTRITIONAL SUPPLEMENTS (NUTREN 2.0) LIQD    40 mL/hr. "CONTINUOUSLY WITH FLUSH 175 ML'S EVERY 4 HOURS VIA PUMP/PROVIDES 2,020 KCAL/DAY"   OMEPRAZOLE (PRILOSEC) 40 MG CAPSULE    40 mg See admin instructions. Per feeding tube once a day   OXYGEN    Inhale 2 L into the lungs daily as needed (if O2 stats are <90%).    SODIUM PHOSPHATES (RA SALINE ENEMA) 19-7 GM/118ML ENEM    Place 1 enema rectally once as needed (if constipation is not relieved by Dulcolax suppository).   WATER FOR IRRIGATION, STERILE (FREE WATER) SOLN     Place 100 mLs into feeding tube See admin instructions. 100 cc Q 8 hrs only  Modified Medications   No medications on file  Discontinued Medications   FUROSEMIDE (LASIX) 10 MG/ML SOLUTION    Place 2 mLs (20 mg total) into feeding tube daily.   INSULIN GLARGINE (LANTUS SOLOSTAR) 100 UNIT/ML SOLOSTAR PEN    Inject 12 Units into the skin at bedtime.     Physical Exam: Vitals:   01/26/17 0953  BP: 124/76  Pulse: 69  Resp: 20  Temp: 98.4 F (36.9 C)  SpO2: 95%  Weight: 194  lb 3.2 oz (88.1 kg)  Height: 5\' 6"  (1.676 m)    Physical Exam  Constitutional: She is oriented to person, place, and time. She appears well-developed and well-nourished.  Neurological: She is alert and oriented to person, place, and time.  Skin: Skin is warm and dry.  Psychiatric: She has a normal mood and affect.    Labs reviewed: Basic Metabolic Panel:  Recent Labs  10/25/16 0459 10/26/16 0211 10/27/16 0421  11/18/16 0009  11/19/16 0960 11/21/16 0450 11/22/16 0442 11/23/16 0442  NA 140 143 141  142  < >  --   < > 140 138 142 141  K 3.4* 3.0* 3.2*  3.3*  < >  --   < > 4.2 4.0 4.2 4.2  CL 109 112* 108  109  < >  --   < > 104 99* 102 102  CO2 23 25 24  26   < >  --   < > 29 34* 35* 34*  GLUCOSE 110* 82 150*  150*  < >  --   < > 255* 275* 257* 219*  BUN 10 8 8  7   < >  --   < > 41* 41* 44* 40*  CREATININE 1.30* 1.28* 1.23*  1.23*  < >  --   < > 1.24* 1.34* 1.30* 1.24*  CALCIUM 8.5* 8.7* 8.6*  8.5*  < >  --   < > 9.0 9.0 9.4 9.2  MG 1.8  --  1.8  --  2.0  --  1.8 1.9  --   --   PHOS 2.7 2.9 2.1*  2.2*  --   --   --   --   --   --   --   < > = values in this interval not displayed. Liver Function Tests:  Recent Labs  10/27/16 0421 11/18/16 0505 11/19/16 0437  AST 15 34 28  ALT 19 42 41  ALKPHOS 71 108 101  BILITOT 0.6 0.8 0.4  PROT 5.6* 6.4* 6.0*  ALBUMIN 1.8*  1.7* 2.0* 1.9*   No results for input(s): LIPASE, AMYLASE in the last 8760 hours. No results for input(s): AMMONIA in the  last 8760 hours. CBC:  Recent Labs  11/17/16 1330 11/18/16 0505 11/19/16 0437 11/22/16 0442  WBC 6.8 4.4 4.5 4.4  NEUTROABS 5.0  --  2.9 2.1  HGB 9.2* 8.4* 8.4* 8.3*  HCT 27.7* 26.2* 25.7* 26.7*  MCV 89.6 91.0 91.8 94.7  PLT 248 231 230 216   TSH:  Recent Labs  03/18/16 0626 10/17/16 0445  TSH 2.970 1.027   A1C: Lab Results  Component Value Date   HGBA1C 7.1 (H) 10/13/2016   Lipid Panel:  Recent Labs  03/18/16 0626  CHOL 171  HDL 56  LDLCALC 100*  TRIG 77  CHOLHDL 3.1     Assessment/Plan 1. RLS (restless legs syndrome) Will cont nortriptyline for RLS, staff to update their MAR on why she is taking medication.    Carlos American. Harle Battiest  [facility] [phone] 8 am - 5 pm) [phone] (after hours)

## 2017-02-14 ENCOUNTER — Encounter: Payer: Self-pay | Admitting: Nurse Practitioner

## 2017-02-14 ENCOUNTER — Non-Acute Institutional Stay (SKILLED_NURSING_FACILITY): Payer: Medicare Other | Admitting: Nurse Practitioner

## 2017-02-14 DIAGNOSIS — I5043 Acute on chronic combined systolic (congestive) and diastolic (congestive) heart failure: Secondary | ICD-10-CM

## 2017-02-14 DIAGNOSIS — N183 Chronic kidney disease, stage 3 unspecified: Secondary | ICD-10-CM

## 2017-02-14 DIAGNOSIS — R1310 Dysphagia, unspecified: Secondary | ICD-10-CM

## 2017-02-14 DIAGNOSIS — E1122 Type 2 diabetes mellitus with diabetic chronic kidney disease: Secondary | ICD-10-CM

## 2017-02-14 DIAGNOSIS — I10 Essential (primary) hypertension: Secondary | ICD-10-CM

## 2017-02-14 DIAGNOSIS — E785 Hyperlipidemia, unspecified: Secondary | ICD-10-CM

## 2017-02-14 NOTE — Progress Notes (Signed)
Nursing Home Location:  [facility]   Place of Service: SNF (31)  PCP: Vidal Schwalbe, MD  Allergies  Allergen Reactions  . Sulfa Antibiotics Swelling    Chief Complaint  Patient presents with  . Medical Management of Chronic Issues    Resident is being seen for routine visit.     HPI:  Patient is a 76 y.o. female seen today at [facility] for routine follow up on chronic conditions. Pt with hx of systolicCHF, DM type 2, CKD stage 2-3, recent admission to [facility] due for infected cervical hardware with retropharyngeal abscess-status post exploration and irrigation debridement of prevertebral abscess and removal of hardware, status post short-term PEG tube.  Pt has been doing well with therapy until yesterday she has an episode of increase in shortness of breath with O2 sats at 88% 2L  applied and O2 improved to 96% She reports she is doing better but having conversational dyspnea.  No swelling to LE but noted increase in swelling in bilateral hands.  5 lb weight gain noted in 4 days.   eview of Systems:  Review of Systems  Constitutional: Negative for activity change, appetite change, fatigue, fever and unexpected weight change.  HENT: Negative for congestion and hearing loss.   Eyes: Negative.   Respiratory: Positive for shortness of breath. Negative for cough and wheezing.   Cardiovascular: Negative for chest pain, palpitations and leg swelling.  Gastrointestinal: Negative for abdominal pain, constipation and diarrhea.  Genitourinary: Negative for difficulty urinating and dysuria.  Musculoskeletal: Negative for arthralgias and myalgias.  Skin: Negative for color change and wound.  Neurological: Negative for dizziness and weakness.  Psychiatric/Behavioral: Negative for agitation, behavioral problems, confusion, dysphoric mood and sleep disturbance.    Past Medical History:  Diagnosis Date  . Anemia   . Arthritis   . CAD (coronary artery  disease)    a. minimal CAD by cath in 03/2016  . CHF (congestive heart failure) ([place])    a. 09/2016: Echo with EF of 30-35%, moderate diffuse HK, Grade 2 DD, PA peak pressure of 39 mm Hg.  Marland Kitchen Chronic kidney disease    Renal Insuffiency, see [facility] once a year  . Diabetes mellitus    type 2  . GERD (gastroesophageal reflux disease)   . History of rotator cuff tear    Torn right rotator cuff.  . History of spinal stenosis   . Hypertension   . Infiltrative cardiomyopathy ([place])    a, presumed cardiac amyloidosis by MRI with prior nondiagnostic fat pad biopsy  . Neuropathy associated with endocrine disorder [facility])    Past Surgical History:  Procedure Laterality Date  . ABDOMINAL HYSTERECTOMY  1971   Partial  . ANTERIOR CERVICAL DECOMP/DISCECTOMY FUSION N/A 10/18/2016   Procedure: debridement and drainage cervical prevertebral abscess  ANTERIOR CERVICAL HARDWARE REMOVAL exploration cervical fusion;  Surgeon: Earnie Larsson, MD;  Location: [facility];  Service: Neurosurgery;  Laterality: N/A;  . APPENDECTOMY    . BACK SURGERY  2009   Disectomy  . CARDIAC CATHETERIZATION N/A 03/22/2016   Procedure: Left Heart Cath and Coronary Angiography;  Surgeon: Peter M Martinique, MD;  Location: [facility];  Service: Cardiovascular;  Laterality: N/A;  . CHOLECYSTECTOMY  1969   Status post   . CYST REMOVAL TRUNK Right 12/03/2014   Procedure: EXCISION OF RIGHT BACK CYST ;  Surgeon: Coralie Keens, MD;  Location: [facility];  Service: General;  Laterality: Right;  . DIRECT LARYNGOSCOPY N/A 10/17/2016  Procedure: DIRECT LARYNGOSCOPY,  ESOPHAGOSCOPY;  Surgeon: [name], MD;  Location: [facility];  Service: ENT;  Laterality: N/A;  . FOOT SURGERY Bilateral    bone removed  . IR GENERIC HISTORICAL  10/25/2016   IR GASTROSTOMY TUBE MOD SED 10/25/2016 [name], MD [facility]-INTERV RAD  . NEUROPLASTY / TRANSPOSITION MEDIAN NERVE AT CARPAL TUNNEL Bilateral    Hx  Left carpal tunnel repair  . TRACHEOSTOMY TUBE  PLACEMENT N/A 10/17/2016   Procedure: TRACHEOSTOMY;  Surgeon: [name], MD;  Location: [facility];  Service: ENT;  Laterality: N/A;   Social History:   reports that she has quit smoking. She has never used smokeless tobacco. She reports that she does not drink alcohol or use drugs.  Family History  Problem Relation Age of Onset  . Diabetes type II Mother   . Hypertension Father   . Diabetes type II Sister   . Ovarian cancer Sister   . Diabetes type II Other   . Stroke Neg Hx   . CAD Neg Hx   . Heart failure Neg Hx     Medications: Patient's Medications  New Prescriptions   No medications on file  Previous Medications   ACETAMINOPHEN (TYLENOL) 325 MG TABLET    Take 650 mg by mouth every 6 (six) hours as needed for mild pain.   AMINO ACIDS-PROTEIN HYDROLYS (FEEDING SUPPLEMENT, PRO-STAT SUGAR FREE 64,) LIQD    Place 30 mLs into feeding tube daily. FLUSH WITH 30 ML'S WATER BEFORE AND AFTER MEDICATIONS   ASPIRIN 81 MG CHEWABLE TABLET    Place 81 mg into feeding tube daily.   BISACODYL (DULCOLAX) 10 MG SUPPOSITORY    Place 10 mg rectally once as needed (if no relief from Milk of Magnesia).   CALCIUM CARB-CHOLECALCIFEROL (CALCIUM 600 + D PO)    Place 1 tablet into feeding tube every morning.    CHOLECALCIFEROL (VITAMIN D) 1000 UNITS TABLET    1,000 Units See admin instructions. Per feeding tube once a day   FERROUS SULFATE 325 (65 FE) MG EC TABLET    325 mg See admin instructions. Per feeding tube in the morning   FUROSEMIDE (LASIX) 10 MG/ML SOLUTION    Take 20 mg by mouth daily.   INSULIN GLARGINE (LANTUS SOLOSTAR) 100 UNIT/ML SOLOSTAR PEN    Inject 12 Units into the skin daily.   IRBESARTAN (AVAPRO) 75 MG TABLET    Place 37.5 mg into feeding tube daily.   MAGNESIUM HYDROXIDE (MILK OF MAGNESIA) 400 MG/5ML SUSPENSION    Place 30 mLs into feeding tube once as needed (if no bowel movement in 3 days).    METOPROLOL TARTRATE (LOPRESSOR) 25 MG TABLET    Place 1 tablet (25 mg total) into  feeding tube 2 (two) times daily.   MULTIPLE VITAMIN (TAB-A-VITE) TABS    Place 1 tablet into feeding tube every morning.   NORTRIPTYLINE (PAMELOR) 10 MG CAPSULE    Place 10 mg into feeding tube at bedtime.    NUTRITIONAL SUPPLEMENTS (NUTREN 2.0) LIQD    40 mL/hr. "CONTINUOUSLY WITH FLUSH 175 ML'S EVERY 4 HOURS VIA PUMP/PROVIDES 2,020 KCAL/DAY"   OMEPRAZOLE (PRILOSEC) 40 MG CAPSULE    40 mg See admin instructions. Per feeding tube once a day   OXYGEN    Inhale 2 L into the lungs daily as needed (if O2 stats are <90%).    SODIUM PHOSPHATES (RA SALINE ENEMA) 19-7 GM/118ML ENEM    Place 1 enema rectally once as needed (if constipation is  not relieved by Dulcolax suppository).   WATER FOR IRRIGATION, STERILE (FREE WATER) SOLN    Place 100 mLs into feeding tube See admin instructions. 100 cc Q 8 hrs only  Modified Medications   No medications on file  Discontinued Medications   No medications on file     Physical Exam: Vitals:   02/14/17 1059  BP: 110/70  Pulse: 66  Resp: 20  Temp: 97.8 F (36.6 C)  SpO2: 98%  Weight: 198 lb (89.8 kg)  Height: 5\' 6"  (1.676 m)    Physical Exam  Constitutional: She is oriented to person, place, and time. She appears well-developed and well-nourished. No distress.  HENT:  Head: Normocephalic and atraumatic.  Mouth/Throat: Oropharynx is clear and moist. No oropharyngeal exudate.  Eyes: Conjunctivae are normal. Pupils are equal, round, and reactive to light.  Neck: Normal range of motion. Neck supple.  Cardiovascular: Normal rate, regular rhythm and normal heart sounds.   Pulmonary/Chest: Effort normal. She has rales.  Abdominal: Soft. Bowel sounds are normal.  Musculoskeletal: She exhibits edema (to bilateral hands, +1). She exhibits no tenderness.  Neurological: She is alert and oriented to person, place, and time.  Skin: Skin is warm and dry. She is not diaphoretic.  Psychiatric: She has a normal mood and affect.    Labs reviewed: Basic  Metabolic Panel:  Recent Labs  10/25/16 0459 10/26/16 0211 10/27/16 0421  11/18/16 0009  11/19/16 3335 11/21/16 0450 11/22/16 0442 11/23/16 0442  NA 140 143 141  142  < >  --   < > 140 138 142 141  K 3.4* 3.0* 3.2*  3.3*  < >  --   < > 4.2 4.0 4.2 4.2  CL 109 112* 108  109  < >  --   < > 104 99* 102 102  CO2 23 25 24  26   < >  --   < > 29 34* 35* 34*  GLUCOSE 110* 82 150*  150*  < >  --   < > 255* 275* 257* 219*  BUN 10 8 8  7   < >  --   < > 41* 41* 44* 40*  CREATININE 1.30* 1.28* 1.23*  1.23*  < >  --   < > 1.24* 1.34* 1.30* 1.24*  CALCIUM 8.5* 8.7* 8.6*  8.5*  < >  --   < > 9.0 9.0 9.4 9.2  MG 1.8  --  1.8  --  2.0  --  1.8 1.9  --   --   PHOS 2.7 2.9 2.1*  2.2*  --   --   --   --   --   --   --   < > = values in this interval not displayed. Liver Function Tests:  Recent Labs  10/27/16 0421 11/18/16 0505 11/19/16 0437  AST 15 34 28  ALT 19 42 41  ALKPHOS 71 108 101  BILITOT 0.6 0.8 0.4  PROT 5.6* 6.4* 6.0*  ALBUMIN 1.8*  1.7* 2.0* 1.9*   No results for input(s): LIPASE, AMYLASE in the last 8760 hours. No results for input(s): AMMONIA in the last 8760 hours. CBC:  Recent Labs  11/17/16 1330 11/18/16 0505 11/19/16 0437 11/22/16 0442  WBC 6.8 4.4 4.5 4.4  NEUTROABS 5.0  --  2.9 2.1  HGB 9.2* 8.4* 8.4* 8.3*  HCT 27.7* 26.2* 25.7* 26.7*  MCV 89.6 91.0 91.8 94.7  PLT 248 231 230 216   TSH:  Recent Labs  03/18/16 0626  10/17/16 0445  TSH 2.970 1.027   A1C: Lab Results  Component Value Date   HGBA1C 7.1 (H) 10/13/2016   Lipid Panel:  Recent Labs  03/18/16 0626  CHOL 171  HDL 56  LDLCALC 100*  TRIG 77  CHOLHDL 3.1     Assessment/Plan 1. Acute on chronic combined systolic and diastolic heart failure (HCC) Pt with 5 lb weight gain in 4 days with increase edema to upper extremities and shortness of breath. Will increase lasix to 60 mg daily then 40 mg daily x 2 days then to resume lasix 20 mg daily Will get BMP in AM 2 gm sodium  diet -VS q shift -cont O2 for sats < 90%  2. Type 2 diabetes mellitus with stage 3 chronic kidney disease, without long-term current use of insulin (HCC) Blood sugars at 8 pm ranging from 96-400, will follow up A1c at this time. To cont lantus 12 untis.   3. Dysphagia, unspecified type Taking medication PO with applesauce and doing well. No signs of aspiration. Cont on nectar thick liquids.   4. Essential hypertension Blood pressure stable at this time, will cont current regimen  5. CKD (chronic kidney disease) stage 3, GFR 30-59 ml/min Will follow up BMP at this time.   6. Hyperlipidemia, unspecified hyperlipidemia type Will follow up lipid panel at this time  7. Anemia -conts on iron, will follow up CBC to check hgb   Younes Degeorge K. Harle Battiest  [facility] [phone] 8 am - 5 pm) [phone] (after hours)

## 2017-02-15 ENCOUNTER — Encounter: Payer: Self-pay | Admitting: Internal Medicine

## 2017-02-15 ENCOUNTER — Non-Acute Institutional Stay (SKILLED_NURSING_FACILITY): Payer: Medicare Other | Admitting: Internal Medicine

## 2017-02-15 DIAGNOSIS — J189 Pneumonia, unspecified organism: Secondary | ICD-10-CM | POA: Diagnosis not present

## 2017-02-15 LAB — LIPID PANEL
Cholesterol: 174 mg/dL (ref 0–200)
HDL: 58 mg/dL (ref 35–70)
LDL Cholesterol: 107 mg/dL
Triglycerides: 48 mg/dL (ref 40–160)

## 2017-02-15 LAB — CBC AND DIFFERENTIAL
HCT: 35 % — AB (ref 36–46)
Hemoglobin: 10.8 g/dL — AB (ref 12.0–16.0)
Platelets: 117 10*3/uL — AB (ref 150–399)
WBC: 4.2 10*3/mL

## 2017-02-15 LAB — BASIC METABOLIC PANEL
BUN: 37 mg/dL — AB (ref 4–21)
CREATININE: 1.3 mg/dL — AB (ref 0.5–1.1)
Glucose: 134 mg/dL
POTASSIUM: 4 mmol/L (ref 3.4–5.3)
SODIUM: 144 mmol/L (ref 137–147)

## 2017-02-15 LAB — TSH: TSH: 6.8 u[IU]/mL — AB (ref 0.41–5.90)

## 2017-02-15 NOTE — Patient Instructions (Addendum)
She'll be treated with short course of oral antibiotics to cover the atypical organisms, burst of steroids, and nebulizer treatments. Recheck A1c and CBC and differential.

## 2017-02-15 NOTE — Progress Notes (Signed)
This is a nursing facility follow up of abnormal chest x-ray   Interim medical record and care since last [place] visit was updated with review of diagnostic studies and change in clinical status since last visit were documented.  HPI: The patient was seen by the nurse practitioner 02/14/17 for dyspnea.. Clinically she was concerned about acute on chronic systolic and diastolic heart failure. Furosemide was increased to 60 mg daily 1 and 40 mg daily 2. Subsequently dose was to be continued at 20 mg daily. Chemistries were drawn today  but are pending. Chest x-ray was reported as left basilar infiltrates and left pleural effusion. The film was personally reviewed. The patient has massive cardiomegaly with hilar enlargement; left hemidiaphragm was obscured. There is airspace density  present in the left costophrenic angle area suggesting infiltrate and/or effusion. The patchy pattern &  absence of meniscus is worrisome for infection. There was some increased interstitial markings but not significantly so compared to prior films. Past medical history includes respiratory failure on 2 occasions and tracheostomy in November 2017. Her last A1c was 10/13/16 with a value of 7.1%  Review of systems: Reports ongoing shortness of breath but improved.   Reports cough with no sputum production.  Positive for chest tightness and wheezing.  Reports dysphagia during meals, choked this morning eating grits.  Reports improving swelling in hands and feet.  Reports nausea this morning that has resolved.  She describes chronic nocturnal heat intolerance.  Constitutional: No fever,significant weight change, fatigue  Eyes: No redness, discharge, pain, vision change ENT/mouth: No nasal congestion,  purulent discharge, earache,change in hearing ,sore throat  Cardiovascular: No chest pain, palpitations,paroxysmal nocturnal dyspnea, claudication Respiratory: No hemoptysis,  significant  snoring,apnea   Gastrointestinal: No heartburn,abdominal pain,rectal bleeding, melena,change in bowels Genitourinary: No dysuria,hematuria, pyuria, incontinence, nocturia Musculoskeletal: No joint stiffness, joint swelling, weakness,pain Dermatologic: No rash, pruritus, change in appearance of skin Neurologic: No dizziness,headache,syncope, seizures, numbness , tingling Psychiatric: No significant anxiety , depression, insomnia, anorexia Endocrine: No excessive thirst, excessive hunger, excessive urination  Hematologic/lymphatic: No significant bruising, lymphadenopathy,abnormal bleeding Allergy/immunology: No itchy/ watery eyes, significant sneezing, urticaria, angioedema  Physical exam:  Alert and oriented x 4 Pertinent or positive findings: Arcus senilis. There is subtle exotropia and slight superior deviation intermittently of the right eye. Hirsutism of her chin Ptosis greater in left than right Upper dentures in place a lower partial Clubbing of fingernails. Subtle edema of the right hand PEG tube in place in middle abdomen.  Rhonchi in right lung fields.  Decreased breath sounds in left lower lobe.  Nonpitting edema to bilateral hands, none in lower extremities. Gross deformities of the toenails. Decreased posterior tibial pulses. When the pulses were palpated, she exhibited bilateral Babinski    General appearance:Adequately nourished; no acute distress , increased work of breathing is present.   Lymphatic: No lymphadenopathy about the head, neck, axilla . Eyes: No conjunctival inflammation or lid edema is present. There is no scleral icterus. Ears:  External ear exam shows no significant lesions or deformities.   Nose:  External nasal examination shows no deformity or inflammation. Nasal mucosa are pink and moist without lesions ,exudates Oral exam: lips and gums are healthy appearing.There is no oropharyngeal erythema or exudate . Neck:  No thyromegaly, masses, tenderness  noted.    Heart:  Normal rate and regular rhythm. S1 and S2 normal without gallop, murmur, click, rub .  Abdomen:Bowel sounds are normal. Abdomen is soft and nontender with no organomegaly,  hernias,masses. GU: deferred  Extremities:  No cyanosis  Neurologic exam : Strength equal  in upper & lower extremities Balance,Rhomberg,finger to nose testing could not be completed due to clinical state Skin: Warm & dry w/o tenting. No significant lesions or rash.  #1 left lower lobe infiltrate and/or effusion with subjective cough, chest tightness, and wheezing #2 dyspnea, improved with diuretic #3 edema improved  Discussion/plan: After review the x-ray the symptoms above, I would recommend treating for atypical bronchitis versus early pneumonia

## 2017-02-26 ENCOUNTER — Other Ambulatory Visit (HOSPITAL_COMMUNITY): Payer: Self-pay | Admitting: Internal Medicine

## 2017-02-26 DIAGNOSIS — R1319 Other dysphagia: Secondary | ICD-10-CM

## 2017-02-28 ENCOUNTER — Non-Acute Institutional Stay (SKILLED_NURSING_FACILITY): Payer: Medicare Other | Admitting: Nurse Practitioner

## 2017-02-28 DIAGNOSIS — I5043 Acute on chronic combined systolic (congestive) and diastolic (congestive) heart failure: Secondary | ICD-10-CM

## 2017-02-28 NOTE — Progress Notes (Signed)
Nursing Home Location:  [facility]   Place of Service: SNF (31)  PCP: Vidal Schwalbe, MD  Allergies  Allergen Reactions  . Sulfa Antibiotics Swelling    Chief Complaint  Patient presents with  . Acute Visit    weight gain, hx of CHF    HPI:  Patient is a 76 y.o. female seen today at [facility] fo acute visit for increased weight gain over the last week. On 4/1 pt was 194lb. Today the pt weighed 203lbs; an almost 10 pound weight gain. Pt is currently taking Lasix 20mg  PO daily. Pt was recently treated with temporary increase in diuretic due to worsening dyspnea and CHF exacerbation, chest xray revealed atypical bronchitis versus PNA 3/29 therefore a  short course of oral antibiotic and burst of steroids were ordered. Today, she denies SOB, wheezing, CP, papitations, fevers, chills, or myalgias. She does admit to increased swelling in the right upper and lower extremities. Pt with hx of systolicCHF, DM type 2,and  CKD stage 2-3. She reports she is otherwise doing better.   Review of Systems:  Review of Systems  Constitutional: Positive for fatigue. Negative for activity change, appetite change, chills, diaphoresis, fever and unexpected weight change.  Eyes: Negative.   Respiratory: Negative for cough, chest tightness, shortness of breath and wheezing.   Cardiovascular: Positive for leg swelling. Negative for chest pain and palpitations.  Gastrointestinal: Negative for abdominal pain.  Skin: Negative for color change and wound.  Neurological: Negative for dizziness, syncope, weakness and numbness.  Psychiatric/Behavioral: Negative for agitation, behavioral problems, confusion, dysphoric mood and sleep disturbance.    Past Medical History:  Diagnosis Date  . Anemia   . Arthritis   . CAD (coronary artery disease)    a. minimal CAD by cath in 03/2016  . CHF (congestive heart failure) ([place])    a. 09/2016: Echo with EF of 30-35%, moderate diffuse HK, Grade  2 DD, PA peak pressure of 39 mm Hg.  Marland Kitchen Chronic kidney disease    Renal Insuffiency, see [facility] once a year  . Diabetes mellitus    type 2  . GERD (gastroesophageal reflux disease)   . History of rotator cuff tear    Torn right rotator cuff.  . History of spinal stenosis   . Hypertension   . Infiltrative cardiomyopathy ([place])    a, presumed cardiac amyloidosis by MRI with prior nondiagnostic fat pad biopsy  . Neuropathy associated with endocrine disorder [facility])    Past Surgical History:  Procedure Laterality Date  . ABDOMINAL HYSTERECTOMY  1971   Partial  . ANTERIOR CERVICAL DECOMP/DISCECTOMY FUSION N/A 10/18/2016   Procedure: debridement and drainage cervical prevertebral abscess  ANTERIOR CERVICAL HARDWARE REMOVAL exploration cervical fusion;  Surgeon: Earnie Larsson, MD;  Location: [place];  Service: Neurosurgery;  Laterality: N/A;  . APPENDECTOMY    . BACK SURGERY  2009   Disectomy  . CARDIAC CATHETERIZATION N/A 03/22/2016   Procedure: Left Heart Cath and Coronary Angiography;  Surgeon: Peter M Martinique, MD;  Location: [place] CV [facility];  Service: Cardiovascular;  Laterality: N/A;  . CHOLECYSTECTOMY  1969   Status post   . CYST REMOVAL TRUNK Right 12/03/2014   Procedure: EXCISION OF RIGHT BACK CYST ;  Surgeon: Coralie Keens, MD;  Location: [place];  Service: General;  Laterality: Right;  . DIRECT LARYNGOSCOPY N/A 10/17/2016   Procedure: DIRECT LARYNGOSCOPY,  ESOPHAGOSCOPY;  Surgeon: [name], MD;  [facility];  Service: ENT;  Laterality: N/A;  .  FOOT SURGERY Bilateral    bone removed  . IR GENERIC HISTORICAL  10/25/2016   IR GASTROSTOMY TUBE MOD SED 10/25/2016 [name], MD [facility]-INTERV RAD  . NEUROPLASTY / TRANSPOSITION MEDIAN NERVE AT CARPAL TUNNEL Bilateral    Hx  Left carpal tunnel repair  . TRACHEOSTOMY TUBE PLACEMENT N/A 10/17/2016   Procedure: TRACHEOSTOMY;  Surgeon: [name], MD;  [facility];  Service: ENT;  Laterality: N/A;   Social History:    reports that she has quit smoking. She has never used smokeless tobacco. She reports that she does not drink alcohol or use drugs.  Family History  Problem Relation Age of Onset  . Diabetes type II Mother   . Hypertension Father   . Diabetes type II Sister   . Ovarian cancer Sister   . Diabetes type II Other   . Stroke Neg Hx   . CAD Neg Hx   . Heart failure Neg Hx     Medications: Patient's Medications  New Prescriptions   No medications on file  Previous Medications   ACETAMINOPHEN (TYLENOL) 325 MG TABLET    Take 650 mg by mouth every 6 (six) hours as needed for mild pain.   AMINO ACIDS-PROTEIN HYDROLYS (FEEDING SUPPLEMENT, PRO-STAT SUGAR FREE 64,) LIQD    Place 30 mLs into feeding tube daily. FLUSH WITH 30 ML'S WATER BEFORE AND AFTER MEDICATIONS   ASPIRIN 81 MG CHEWABLE TABLET    Place 81 mg into feeding tube daily.   BISACODYL (DULCOLAX) 10 MG SUPPOSITORY    Place 10 mg rectally once as needed (if no relief from Milk of Magnesia).   CALCIUM CARB-CHOLECALCIFEROL (CALCIUM 600 + D PO)    Place 1 tablet into feeding tube every morning.    CHOLECALCIFEROL (VITAMIN D) 1000 UNITS TABLET    1,000 Units See admin instructions. Per feeding tube once a day   FERROUS SULFATE 325 (65 FE) MG EC TABLET    325 mg See admin instructions. Per feeding tube in the morning   FUROSEMIDE (LASIX) 10 MG/ML SOLUTION    Take 20 mg by mouth daily.   INSULIN GLARGINE (LANTUS SOLOSTAR) 100 UNIT/ML SOLOSTAR PEN    Inject 12 Units into the skin daily.   IRBESARTAN (AVAPRO) 75 MG TABLET    Place 37.5 mg into feeding tube daily.   MAGNESIUM HYDROXIDE (MILK OF MAGNESIA) 400 MG/5ML SUSPENSION    Place 30 mLs into feeding tube once as needed (if no bowel movement in 3 days).    METOPROLOL TARTRATE (LOPRESSOR) 25 MG TABLET    Place 1 tablet (25 mg total) into feeding tube 2 (two) times daily.   MULTIPLE VITAMIN (TAB-A-VITE) TABS    Place 1 tablet into feeding tube every morning.   NORTRIPTYLINE (PAMELOR) 10 MG CAPSULE     Place 10 mg into feeding tube at bedtime.    NUTRITIONAL SUPPLEMENTS (NUTREN 2.0) LIQD    40 mL/hr. "CONTINUOUSLY WITH FLUSH 175 ML'S EVERY 4 HOURS VIA PUMP/PROVIDES 2,020 KCAL/DAY"   OMEPRAZOLE (PRILOSEC) 40 MG CAPSULE    40 mg See admin instructions. Per feeding tube once a day   OXYGEN    Inhale 2 L into the lungs daily as needed (if O2 stats are <90%).    SODIUM PHOSPHATES (RA SALINE ENEMA) 19-7 GM/118ML ENEM    Place 1 enema rectally once as needed (if constipation is not relieved by Dulcolax suppository).   WATER FOR IRRIGATION, STERILE (FREE WATER) SOLN    Place 100 mLs into feeding  tube See admin instructions. 100 cc Q 8 hrs only  Modified Medications   No medications on file  Discontinued Medications   No medications on file     Physical Exam: Vitals:   02/28/17 1200  BP: 121/79  Pulse: 69  Resp: 18  Temp: 97.9 F (36.6 C)  Weight: 203 lb 3.2 oz (92.2 kg)    Physical Exam  Constitutional: She is oriented to person, place, and time. She appears well-developed and well-nourished. No distress.  HENT:  Head: Normocephalic and atraumatic.  Mouth/Throat: Oropharynx is clear and moist. No oropharyngeal exudate.  Eyes: Conjunctivae are normal. Pupils are equal, round, and reactive to light.  Neck: Normal range of motion. Neck supple.  Cardiovascular: Normal rate, regular rhythm, normal heart sounds and intact distal pulses.   Pulmonary/Chest: Effort normal. No accessory muscle usage. No respiratory distress. She has decreased breath sounds in the left middle field and the left lower field.  Abdominal: Soft. Bowel sounds are normal. She exhibits no distension. There is no tenderness. There is no guarding.  Musculoskeletal: She exhibits edema (To right upper and lower extremity-2+, left upper and lower extremity-1+). She exhibits no tenderness.  Neurological: She is alert and oriented to person, place, and time.  Skin: Skin is warm and dry. She is not diaphoretic.  Psychiatric:  She has a normal mood and affect.    Labs reviewed: Basic Metabolic Panel:  Recent Labs  10/25/16 0459 10/26/16 0211 10/27/16 0421  11/18/16 0009  11/19/16 2706 11/21/16 0450 11/22/16 0442 11/23/16 0442 02/15/17  NA 140 143 141  142  < >  --   < > 140 138 142 141 144  K 3.4* 3.0* 3.2*  3.3*  < >  --   < > 4.2 4.0 4.2 4.2 4.0  CL 109 112* 108  109  < >  --   < > 104 99* 102 102  --   CO2 23 25 24  26   < >  --   < > 29 34* 35* 34*  --   GLUCOSE 110* 82 150*  150*  < >  --   < > 255* 275* 257* 219*  --   BUN 10 8 8  7   < >  --   < > 41* 41* 44* 40* 37*  CREATININE 1.30* 1.28* 1.23*  1.23*  < >  --   < > 1.24* 1.34* 1.30* 1.24* 1.3*  CALCIUM 8.5* 8.7* 8.6*  8.5*  < >  --   < > 9.0 9.0 9.4 9.2  --   MG 1.8  --  1.8  --  2.0  --  1.8 1.9  --   --   --   PHOS 2.7 2.9 2.1*  2.2*  --   --   --   --   --   --   --   --   < > = values in this interval not displayed. Liver Function Tests:  Recent Labs  10/27/16 0421 11/18/16 0505 11/19/16 0437  AST 15 34 28  ALT 19 42 41  ALKPHOS 71 108 101  BILITOT 0.6 0.8 0.4  PROT 5.6* 6.4* 6.0*  ALBUMIN 1.8*  1.7* 2.0* 1.9*   No results for input(s): LIPASE, AMYLASE in the last 8760 hours. No results for input(s): AMMONIA in the last 8760 hours. CBC:  Recent Labs  11/17/16 1330 11/18/16 0505 11/19/16 0437 11/22/16 0442 02/15/17  WBC 6.8 4.4 4.5 4.4 4.2  NEUTROABS 5.0  --  2.9 2.1  --   HGB 9.2* 8.4* 8.4* 8.3* 10.8*  HCT 27.7* 26.2* 25.7* 26.7* 35*  MCV 89.6 91.0 91.8 94.7  --   PLT 248 231 230 216 117*   TSH:  Recent Labs  03/18/16 0626 10/17/16 0445 02/15/17  TSH 2.970 1.027 6.80*   A1C: Lab Results  Component Value Date   HGBA1C 7.1 (H) 10/13/2016   Lipid Panel:  Recent Labs  03/18/16 0626 02/15/17  CHOL 171 174  HDL 56 58  LDLCALC 100* 107  TRIG 77 48  CHOLHDL 3.1  --      Assessment/Plan 1. Acute on chronic combined systolic and diastolic congestive heart failure (HCC) -Pt has gained  approximately 10lb over the last 10 days -Will increase her lasix from 20mg  to 40mg  PO daily -Recheck BMP in one week to assess potassium and kidney function -Staff to monitor for infection, change in sputum, or drop in saturations-all stable today.   Carlos American. Harle Battiest  [facility] [phone] 8 am - 5 pm) [phone] (after hours)

## 2017-03-01 ENCOUNTER — Ambulatory Visit (HOSPITAL_COMMUNITY): Payer: Medicare Other

## 2017-03-02 ENCOUNTER — Non-Acute Institutional Stay (SKILLED_NURSING_FACILITY): Payer: Medicare Other | Admitting: Nurse Practitioner

## 2017-03-02 ENCOUNTER — Encounter: Payer: Self-pay | Admitting: Nurse Practitioner

## 2017-03-02 DIAGNOSIS — M7989 Other specified soft tissue disorders: Secondary | ICD-10-CM

## 2017-03-02 NOTE — Progress Notes (Signed)
Nursing Home Location:  [facility]   Place of Service: SNF (31)  PCP: Vidal Schwalbe, MD  Allergies  Allergen Reactions  . Sulfa Antibiotics Swelling    Chief Complaint  Patient presents with  . Acute Visit    Resident is being seen due to right leg swelling.     HPI:  Patient is a 76 y.o. female seen today at [facility] for acute visit for right leg swelling.  Pt with hx of systolicCHF, DM type 2,and CKD stage 2-3. Swelling and pain began last night.  The pain is originating at the right knee and extending down to the foot.  She describes the pain as pins and needles and constant. The pain is not worsened by movement. She has both legs elevated on pillows.  Denies recent trauma, redness, or rashes to that extremity.  She has not taken anything for the pain and has been taking her diuretics as prescribed.  Lasix recently increased due to progressive weight gain with hx of CHF.   Review of Systems:  Review of Systems  Constitutional: Negative for activity change, chills, diaphoresis, fatigue and unexpected weight change.  Eyes: Negative.   Respiratory: Negative for cough, chest tightness, shortness of breath and wheezing.   Cardiovascular: Positive for leg swelling (right greater than left). Negative for chest pain and palpitations.  Gastrointestinal: Negative for abdominal pain.  Skin: Negative for color change, rash and wound.  Neurological: Positive for weakness (states right leg feels "heavy"). Negative for dizziness, syncope, numbness and headaches.  Psychiatric/Behavioral: Negative for agitation and confusion.    Past Medical History:  Diagnosis Date  . Anemia   . Arthritis   . CAD (coronary artery disease)    a. minimal CAD by cath in 03/2016  . CHF (congestive heart failure) ([place])    a. 09/2016: Echo with EF of 30-35%, moderate diffuse HK, Grade 2 DD, PA peak pressure of 39 mm Hg.  Marland Kitchen Chronic kidney disease    Renal Insuffiency, see  [facility] once a year  . Diabetes mellitus    type 2  . GERD (gastroesophageal reflux disease)   . History of rotator cuff tear    Torn right rotator cuff.  . History of spinal stenosis   . Hypertension   . Infiltrative cardiomyopathy ([place])    a, presumed cardiac amyloidosis by MRI with prior nondiagnostic fat pad biopsy  . Neuropathy associated with endocrine disorder [facility])    Past Surgical History:  Procedure Laterality Date  . ABDOMINAL HYSTERECTOMY  1971   Partial  . ANTERIOR CERVICAL DECOMP/DISCECTOMY FUSION N/A 10/18/2016   Procedure: debridement and drainage cervical prevertebral abscess  ANTERIOR CERVICAL HARDWARE REMOVAL exploration cervical fusion;  Surgeon: Earnie Larsson, MD;  Location: [place];  Service: Neurosurgery;  Laterality: N/A;  . APPENDECTOMY    . BACK SURGERY  2009   Disectomy  . CARDIAC CATHETERIZATION N/A 03/22/2016   Procedure: Left Heart Cath and Coronary Angiography;  Surgeon: Peter M Martinique, MD;  Location: [place] [facility];  Service: Cardiovascular;  Laterality: N/A;  . CHOLECYSTECTOMY  1969   Status post   . CYST REMOVAL TRUNK Right 12/03/2014   Procedure: EXCISION OF RIGHT BACK CYST ;  Surgeon: Coralie Keens, MD;  Location: [place];  Service: General;  Laterality: Right;  . DIRECT LARYNGOSCOPY N/A 10/17/2016   Procedure: DIRECT LARYNGOSCOPY,  ESOPHAGOSCOPY;  Surgeon: [name], MD;  [facility];  Service: ENT;  Laterality: N/A;  . FOOT  SURGERY Bilateral    bone removed  . IR GENERIC HISTORICAL  10/25/2016   IR GASTROSTOMY TUBE MOD SED 10/25/2016 [name], MD [facility]-INTERV RAD  . NEUROPLASTY / TRANSPOSITION MEDIAN NERVE AT CARPAL TUNNEL Bilateral    Hx  Left carpal tunnel repair  . TRACHEOSTOMY TUBE PLACEMENT N/A 10/17/2016   Procedure: TRACHEOSTOMY;  Surgeon: [name], MD;  [facility];  Service: ENT;  Laterality: N/A;   Social History:   reports that she has quit smoking. She has never used smokeless tobacco. She reports that  she does not drink alcohol or use drugs.  Family History  Problem Relation Age of Onset  . Diabetes type II Mother   . Hypertension Father   . Diabetes type II Sister   . Ovarian cancer Sister   . Diabetes type II Other   . Stroke Neg Hx   . CAD Neg Hx   . Heart failure Neg Hx     Medications: Patient's Medications  New Prescriptions   No medications on file  Previous Medications   ACETAMINOPHEN (TYLENOL) 325 MG TABLET    Take 650 mg by mouth every 6 (six) hours as needed for mild pain.   AMINO ACIDS-PROTEIN HYDROLYS (FEEDING SUPPLEMENT, PRO-STAT SUGAR FREE 64,) LIQD    Place 30 mLs into feeding tube daily. FLUSH WITH 30 ML'S WATER BEFORE AND AFTER MEDICATIONS   ASPIRIN 81 MG CHEWABLE TABLET    Place 81 mg into feeding tube daily.   BISACODYL (DULCOLAX) 10 MG SUPPOSITORY    Place 10 mg rectally once as needed (if no relief from Milk of Magnesia).   CALCIUM CARB-CHOLECALCIFEROL (CALCIUM 600 + D PO)    Place 1 tablet into feeding tube every morning.    CHOLECALCIFEROL (VITAMIN D) 1000 UNITS TABLET    1,000 Units See admin instructions. Per feeding tube once a day   FERROUS SULFATE 325 (65 FE) MG EC TABLET    325 mg See admin instructions. Per feeding tube in the morning   FUROSEMIDE (LASIX) 40 MG TABLET    Take 40 mg by mouth daily.   INSULIN GLARGINE (LANTUS SOLOSTAR) 100 UNIT/ML SOLOSTAR PEN    Inject 12 Units into the skin daily.   IRBESARTAN (AVAPRO) 75 MG TABLET    Place 37.5 mg into feeding tube daily.   MAGNESIUM HYDROXIDE (MILK OF MAGNESIA) 400 MG/5ML SUSPENSION    Place 30 mLs into feeding tube once as needed (if no bowel movement in 3 days).    METOPROLOL TARTRATE (LOPRESSOR) 25 MG TABLET    Place 1 tablet (25 mg total) into feeding tube 2 (two) times daily.   MULTIPLE VITAMIN (TAB-A-VITE) TABS    Place 1 tablet into feeding tube every morning.   NORTRIPTYLINE (PAMELOR) 10 MG CAPSULE    Place 10 mg into feeding tube at bedtime.    NUTRITIONAL SUPPLEMENTS (NUTREN 2.0) LIQD     40 mL/hr. "CONTINUOUSLY WITH FLUSH 175 ML'S EVERY 4 HOURS VIA PUMP/PROVIDES 2,020 KCAL/DAY"   OMEPRAZOLE (PRILOSEC) 40 MG CAPSULE    40 mg See admin instructions. Per feeding tube once a day   OXYGEN    Inhale 2 L into the lungs daily as needed (if O2 stats are <90%).    SODIUM PHOSPHATES (RA SALINE ENEMA) 19-7 GM/118ML ENEM    Place 1 enema rectally once as needed (if constipation is not relieved by Dulcolax suppository).   WATER FOR IRRIGATION, STERILE (FREE WATER) SOLN    Place 100 mLs into feeding tube  See admin instructions. 100 cc Q 8 hrs only  Modified Medications   No medications on file  Discontinued Medications   FUROSEMIDE (LASIX) 10 MG/ML SOLUTION    Take 20 mg by mouth daily.     Physical Exam: Vitals:   03/02/17 1406  BP: (!) 144/72  Pulse: 70  Resp: 20  Temp: 98.2 F (36.8 C)  SpO2: 97%  Weight: 196 lb (88.9 kg)  Height: 5\' 6"  (1.676 m)    Physical Exam  Constitutional: She is oriented to person, place, and time. She appears well-developed and well-nourished. No distress.  HENT:  Head: Normocephalic and atraumatic.  Mouth/Throat: No oropharyngeal exudate.  Eyes: Conjunctivae are normal. Pupils are equal, round, and reactive to light.  Neck: Normal range of motion. Neck supple.  Cardiovascular: Normal rate, regular rhythm, normal heart sounds and intact distal pulses.   Pulmonary/Chest: Effort normal and breath sounds normal. No accessory muscle usage.  Abdominal: Soft. Bowel sounds are normal. She exhibits no distension. There is no tenderness.  Musculoskeletal: She exhibits edema (1+ to left lower extremity, 2+ to right lower extremity). She exhibits no tenderness.  Neurological: She is alert and oriented to person, place, and time.  Skin: Skin is warm and dry. No rash noted. She is not diaphoretic. No erythema.  Psychiatric: She has a normal mood and affect.    Labs reviewed: Basic Metabolic Panel:  Recent Labs  10/25/16 0459 10/26/16 0211  10/27/16 0421  11/18/16 0009  11/19/16 5364 11/21/16 0450 11/22/16 0442 11/23/16 0442 02/15/17  NA 140 143 141  142  < >  --   < > 140 138 142 141 144  K 3.4* 3.0* 3.2*  3.3*  < >  --   < > 4.2 4.0 4.2 4.2 4.0  CL 109 112* 108  109  < >  --   < > 104 99* 102 102  --   CO2 23 25 24  26   < >  --   < > 29 34* 35* 34*  --   GLUCOSE 110* 82 150*  150*  < >  --   < > 255* 275* 257* 219*  --   BUN 10 8 8  7   < >  --   < > 41* 41* 44* 40* 37*  CREATININE 1.30* 1.28* 1.23*  1.23*  < >  --   < > 1.24* 1.34* 1.30* 1.24* 1.3*  CALCIUM 8.5* 8.7* 8.6*  8.5*  < >  --   < > 9.0 9.0 9.4 9.2  --   MG 1.8  --  1.8  --  2.0  --  1.8 1.9  --   --   --   PHOS 2.7 2.9 2.1*  2.2*  --   --   --   --   --   --   --   --   < > = values in this interval not displayed. Liver Function Tests:  Recent Labs  10/27/16 0421 11/18/16 0505 11/19/16 0437  AST 15 34 28  ALT 19 42 41  ALKPHOS 71 108 101  BILITOT 0.6 0.8 0.4  PROT 5.6* 6.4* 6.0*  ALBUMIN 1.8*  1.7* 2.0* 1.9*   No results for input(s): LIPASE, AMYLASE in the last 8760 hours. No results for input(s): AMMONIA in the last 8760 hours. CBC:  Recent Labs  11/17/16 1330 11/18/16 0505 11/19/16 0437 11/22/16 0442 02/15/17  WBC 6.8 4.4 4.5 4.4 4.2  NEUTROABS 5.0  --  2.9 2.1  --   HGB 9.2* 8.4* 8.4* 8.3* 10.8*  HCT 27.7* 26.2* 25.7* 26.7* 35*  MCV 89.6 91.0 91.8 94.7  --   PLT 248 231 230 216 117*   TSH:  Recent Labs  03/18/16 0626 10/17/16 0445 02/15/17  TSH 2.970 1.027 6.80*   A1C: Lab Results  Component Value Date   HGBA1C 7.1 (H) 10/13/2016   Lipid Panel:  Recent Labs  03/18/16 0626 02/15/17  CHOL 171 174  HDL 56 58  LDLCALC 100* 107  TRIG 77 48  CHOLHDL 3.1  --      Assessment/Plan 1. Right leg swelling Swelling in right greater than left with no focal areas of warmth or tenderness.  Distal pulses intact.  Due to patient's history of heart failure, age, and limited mobility, will order a venous doppler to rule  out DVT.     Carlos American. Harle Battiest  [facility] [phone] 8 am - 5 pm) [phone] (after hours)

## 2017-03-05 ENCOUNTER — Ambulatory Visit (HOSPITAL_COMMUNITY)
Admission: RE | Admit: 2017-03-05 | Discharge: 2017-03-05 | Disposition: A | Discharge status: Home / Self Care | Payer: Medicare Other | Source: Ambulatory Visit | Attending: Internal Medicine | Admitting: Internal Medicine

## 2017-03-05 DIAGNOSIS — L0211 Cutaneous abscess of neck: Secondary | ICD-10-CM | POA: Diagnosis not present

## 2017-03-05 DIAGNOSIS — R1319 Other dysphagia: Secondary | ICD-10-CM

## 2017-03-06 ENCOUNTER — Encounter: Payer: Self-pay | Admitting: Internal Medicine

## 2017-03-07 LAB — BASIC METABOLIC PANEL
BUN: 43 mg/dL — AB (ref 4–21)
CREATININE: 1.4 mg/dL — AB (ref 0.5–1.1)
Glucose: 69 mg/dL
POTASSIUM: 4.1 mmol/L (ref 3.4–5.3)
SODIUM: 144 mmol/L (ref 137–147)

## 2017-03-08 ENCOUNTER — Non-Acute Institutional Stay (SKILLED_NURSING_FACILITY): Payer: Medicare Other | Admitting: Internal Medicine

## 2017-03-08 ENCOUNTER — Encounter: Payer: Self-pay | Admitting: Internal Medicine

## 2017-03-08 DIAGNOSIS — K223 Perforation of esophagus: Secondary | ICD-10-CM | POA: Diagnosis not present

## 2017-03-08 DIAGNOSIS — E1122 Type 2 diabetes mellitus with diabetic chronic kidney disease: Secondary | ICD-10-CM | POA: Diagnosis not present

## 2017-03-08 DIAGNOSIS — N183 Chronic kidney disease, stage 3 (moderate): Secondary | ICD-10-CM | POA: Diagnosis not present

## 2017-03-08 DIAGNOSIS — I5043 Acute on chronic combined systolic (congestive) and diastolic (congestive) heart failure: Secondary | ICD-10-CM

## 2017-03-08 DIAGNOSIS — J387 Other diseases of larynx: Secondary | ICD-10-CM

## 2017-03-08 DIAGNOSIS — R1310 Dysphagia, unspecified: Secondary | ICD-10-CM | POA: Diagnosis not present

## 2017-03-08 NOTE — Assessment & Plan Note (Addendum)
GI  Referral to assess possible cervical esophagus narrowing as a PEG tube removal by IR

## 2017-03-08 NOTE — Assessment & Plan Note (Signed)
Repeat A1c Glucoses are improving, no changes will be made in the Lantus as she may have additional cortisone injections by the orthopedist next week unless A1c is nondiabetic or she has significant hypoglycemia change glucose checks to FBS

## 2017-03-08 NOTE — Assessment & Plan Note (Signed)
GI assessment of possible cervical esophagus narrowing

## 2017-03-08 NOTE — Patient Instructions (Signed)
See assessment and plan under each diagnosis in the problem list and acutely for this visit 

## 2017-03-08 NOTE — Assessment & Plan Note (Signed)
Continue Lasix 40 mg daily sodium avoidance which she is doing  monitor weight ; presently 88.9 kg pulse dose Zaroxolyn

## 2017-03-08 NOTE — Progress Notes (Signed)
[facility] Room: 126  PCP: Vidal Schwalbe, MD [address] [place] [facility] [place]  [id number]   This is a nursing facility follow up for specific acute issues of relative hypoglycemia and F/U of MBS study.  Interim medical record and care since last [place] visit was updated with review of diagnostic studies and change in clinical status since last visit were documented.  HPI: 75 y/o F seen for hypoglycemia reported in labs.  She reports that her blood sugars had been high previously due to nutritional drinks through her PEG tube.  She no longer drinks these. Also approximately one month ago she had cortisone injections in both knees from her orthopedist. Blood sugars since 02/26/17 have ranged between 127-308. However, these were all obtained at night before her Lantus dose.   She performed a modified barium swallow on 03/05/17 as a prelude to removing the PEG tube. The MBS was improved from previous study on 12/25/16.  However, it did show brief high penetration of thin liquids and suggested backflow to the piriformis.  A  GI referral was recommended to assess the patency of cervical esophagus.  She reports her current diet is soft foods that have been cut for her, and she is able to drink thin liquids with medications crushed.      Review of systems:  Denies current symptoms of dysphagia with meals, cough, or vomiting. She does describe intermittent choking unrelated to meals. She also describes intermittent shortness of breath mainly at night. Feels that her edema has progressed slightly. Denies polydipsia, polyuria, lightheadedness, night sweats, or nausea. Reports pain in right knee with a history of arthritis, currently in supportive device.   Constitutional: No fever,significant weight change, fatigue  Eyes: No redness, discharge, pain, vision change ENT/mouth: No nasal congestion,  purulent discharge, earache,change in hearing ,sore throat    Cardiovascular: No chest pain, palpitations,paroxysmal nocturnal dyspnea, claudication, edema  Respiratory: No sputum production,hemoptysis, DOE , significant snoring,apnea  Gastrointestinal: No heartburn, abdominal pain, rectal bleeding, melena,change in bowels Genitourinary: No dysuria,hematuria, pyuria,  incontinence, nocturia Dermatologic: No rash, pruritus, change in appearance of skin Neurologic: No dizziness, headache, syncope, seizures, numbness, tingling Psychiatric: No significant anxiety , depression, insomnia, anorexia Endocrine: No change in hair/skin/ nails, excessive thirst, excessive hunger, excessive urination  Hematologic/lymphatic: No significant bruising, lymphadenopathy,abnormal bleeding Allergy/immunology: No itchy/ watery eyes, significant sneezing, urticaria, angioedema  Physical exam:  Pertinent or positive findings: Arcus senilis bilaterally. At rest the left eye deviates medially and superiorly there is area of hyperpigmentation medial to the left iris. She has an upper plate and lower partial. Ptosis greater on left than right 2+ bilateral lower extremity edema.  Skin taught.  Peg tube midline in abdomen without surrounding erythema or drainage.  Soft brace worn over right lower extremity.  General appearance:Adequately nourished; no acute distress , increased work of breathing is present.   Lymphatic: No lymphadenopathy about the head, neck, axilla . Eyes: No conjunctival inflammation or lid edema is present. There is no scleral icterus. Ears:  External ear exam shows no significant lesions or deformities.   Nose:  External nasal examination shows no deformity or inflammation. Nasal mucosa are pink and moist without lesions ,exudates Oral exam: lips and gums are healthy appearing.There is no oropharyngeal erythema or exudate . Neck:  No thyromegaly, masses, tenderness noted.    Heart:  Normal rate and regular rhythm. S1 and S2 normal without gallop, murmur,  click, rub .  Lungs:Chest  clear to auscultation without wheezes, rhonchi,rales , rubs. Abdomen:Bowel sounds are normal. Abdomen is soft and nontender with no organomegaly, hernias,masses. GU: deferred  Extremities:  No cyanosis, clubbing Neurologic exam : Strength equal  in upper & lower extremities Balance,Rhomberg,finger to nose testing could not be completed due to DJD Skin: Warm & dry w/o tenting. No significant lesions or rash.  See summary under each active problem in the Problem List with associated updated therapeutic plan

## 2017-03-10 LAB — VITAMIN D 25 HYDROXY (VIT D DEFICIENCY, FRACTURES): VIT D 25 HYDROXY: 49.65

## 2017-03-13 ENCOUNTER — Encounter: Payer: Self-pay | Admitting: Internal Medicine

## 2017-03-13 DIAGNOSIS — M25569 Pain in unspecified knee: Secondary | ICD-10-CM

## 2017-03-13 DIAGNOSIS — G8929 Other chronic pain: Secondary | ICD-10-CM | POA: Insufficient documentation

## 2017-03-15 ENCOUNTER — Non-Acute Institutional Stay (SKILLED_NURSING_FACILITY): Payer: Medicare Other | Admitting: Internal Medicine

## 2017-03-15 ENCOUNTER — Encounter: Payer: Self-pay | Admitting: Internal Medicine

## 2017-03-15 DIAGNOSIS — M25561 Pain in right knee: Secondary | ICD-10-CM

## 2017-03-15 DIAGNOSIS — N183 Chronic kidney disease, stage 3 (moderate): Secondary | ICD-10-CM

## 2017-03-15 DIAGNOSIS — G8929 Other chronic pain: Secondary | ICD-10-CM | POA: Diagnosis not present

## 2017-03-15 DIAGNOSIS — E1122 Type 2 diabetes mellitus with diabetic chronic kidney disease: Secondary | ICD-10-CM

## 2017-03-15 NOTE — Assessment & Plan Note (Signed)
The patient qualifies for diabetic shoes

## 2017-03-15 NOTE — Progress Notes (Signed)
  This is a nursing facility follow up for specific acute issue of diabetic foot exam as prelude to obtaining diabetic shoes.  Interim medical record and care since last [place] visit was updated with review of diagnostic studies and change in clinical status since last visit were documented.  HPI: The patient has had a rise in her glucoses related to intra-articular steroids from the orthopedist. last A1c on record was 7.1% on 10/13/16. She denies numbness, tingling, burning in the feet. She has no nonhealing skin lesions. Her present pair of diabetic shoes is approximately 76 years old.  Physical exam:  Pertinent or positive findings: Resting exotropia is present on the left. She is wearing a brace on the right knee. There is fusiform enlargement of the knees. There is an osteoma at the base of the left great toe. He has 1/2+ pedal edema. Toenails are deformed especially the large toenails. She has isolated flexion contractures of the toes. Sensation to light touch is intact. She has no skin lesion over the feet.   General appearance:Adequately nourished; no acute distress , increased work of breathing is present.   Balance,Rhomberg,finger to nose testing could not be completed due to clinical state Skin: Warm & dry w/o tenting. No significant lesions or rash.  See summary under each active problem in the Problem List with associated updated therapeutic plan

## 2017-03-15 NOTE — Patient Instructions (Signed)
See assessment and plan under each diagnosis in the problem list and acutely for this visit 

## 2017-03-22 ENCOUNTER — Non-Acute Institutional Stay (SKILLED_NURSING_FACILITY): Payer: Medicare Other | Admitting: Internal Medicine

## 2017-03-22 ENCOUNTER — Encounter: Payer: Self-pay | Admitting: Internal Medicine

## 2017-03-22 DIAGNOSIS — I5043 Acute on chronic combined systolic (congestive) and diastolic (congestive) heart failure: Secondary | ICD-10-CM

## 2017-03-22 NOTE — Progress Notes (Signed)
   [facility] Room: 201B  PCP: Vidal Schwalbe, MD [address] [place] [address]  This is a nursing facility follow up for specific acute issue of weight gain & edema.  Interim medical record and care since last [place] visit was updated with review of diagnostic studies and change in clinical status since last visit were documented.  HPI:  Her weight has increased 6.35% over 12 days;weight is now 224.4. Weight 11/23/16 was 196.24 # She has persistent edema in lower extremities.  The patient has exhibited flash pulmonary edema in the past. On 10/13/16 echocardiogram revealed left ventricular ejection fraction of 30-35 percent. Cardiomegaly was present ,possibly of a hypertensive or infiltrative etiology. Possible amyloidosis was questioned. There was diffuse hypokinesis with grade 2 diastolic dysfunction. Moderate left atrial enlargement was present. PA pressures were mildly elevated at 39 mmHg. On 11/01/16 BNP was 2941.5. Most recent labs were 03/07/17. Creatinine was 1.4 her liver function test have been normal in the past.  Review of systems: She states that she will have slight exertional dyspnea at times or even changing position. She has no other cardiopulmonary symptoms. She states that she is ambulating better.  Constitutional: No fever,significant weight change, fatigue  Eyes: No redness, discharge, pain, vision change ENT/mouth: No nasal congestion,  purulent discharge, earache,change in hearing ,sore throat  Cardiovascular: No chest pain, palpitations,paroxysmal nocturnal dyspnea, claudication, edema  Respiratory: No cough, sputum production,hemoptysis, significant snoring,apnea  Gastrointestinal: No heartburn,dysphagia,abdominal pain, nausea / vomiting,rectal bleeding, melena,change in bowels Genitourinary: No dysuria,hematuria, pyuria,  incontinence, nocturia Musculoskeletal: No joint stiffness, joint swelling,  weakness,pain Dermatologic: No rash, pruritus, change in appearance of skin Neurologic: No dizziness,headache,syncope, seizures, numbness , tingling Psychiatric: No significant anxiety , depression, insomnia, anorexia Endocrine: No change in hair/skin/ nails, excessive thirst, excessive hunger, excessive urination  Hematologic/lymphatic: No significant bruising, lymphadenopathy,abnormal bleeding Allergy/immunology: No itchy/ watery eyes, significant sneezing, urticaria, angioedema  Physical exam:  Pertinent or positive findings: She has esotropia of the left eye. She has an upper plate and lower partial. Second heart sound is increased. She has rare premature beats. Chest is clear.Striae noted over the abdomen. She has tense nonpitting edema of the legs. Pedal pulses are decreased. She is wearing a soft brace over the right lower extremity.  General appearance:Adequately nourished; no acute distress , increased work of breathing is present.   Lymphatic: No lymphadenopathy about the head, neck, axilla . Eyes: No conjunctival inflammation or lid edema is present. There is no scleral icterus. Ears:  External ear exam shows no significant lesions or deformities.   Nose:  External nasal examination shows no deformity or inflammation. Nasal mucosa are pink and moist without lesions ,exudates Oral exam: lips and gums are healthy appearing.There is no oropharyngeal erythema or exudate . Neck:  No thyromegaly, masses, tenderness noted.    Heart:  Normal rate and regular rhythm. S1  normal without gallop, murmur, click, rub .  Lungs:Chest clear to auscultation without wheezes, rhonchi,rales , rubs. Abdomen:Bowel sounds are normal. Abdomen is soft and nontender with no organomegaly, hernias,masses. GU: deferred  Extremities:  No cyanosis, clubbing  Skin: Warm & dry w/o tenting. No significant lesions or rash.  See summary under each active problem in the Problem List with associated updated  therapeutic plan

## 2017-03-22 NOTE — Patient Instructions (Signed)
See assessment and plan under diagnosis in the problem list  acutely for this visit 

## 2017-03-22 NOTE — Assessment & Plan Note (Addendum)
03/22/17 weight gain of with persistent bilateral edema Add Zaroxolyn 2.5 mg BMET, LFTs, BNP  Cardiology consultation with Dr Debara Pickett

## 2017-03-24 LAB — HEPATIC FUNCTION PANEL
ALT: 35 U/L (ref 7–35)
AST: 28 U/L (ref 13–35)
Alkaline Phosphatase: 126 U/L — AB (ref 25–125)
Bilirubin, Total: 0.4 mg/dL

## 2017-03-24 LAB — BASIC METABOLIC PANEL
BUN: 61 mg/dL — AB (ref 4–21)
CREATININE: 1.7 mg/dL — AB (ref 0.5–1.1)
GLUCOSE: 119 mg/dL
POTASSIUM: 3.9 mmol/L (ref 3.4–5.3)
Sodium: 142 mmol/L (ref 137–147)

## 2017-03-26 ENCOUNTER — Ambulatory Visit: Payer: Medicare Other | Admitting: Student

## 2017-03-27 ENCOUNTER — Telehealth: Payer: Self-pay | Admitting: *Deleted

## 2017-03-27 LAB — BRAIN NATRIURETIC PEPTIDE: B NATRIURETIC PEPTIDE 5: 10683

## 2017-03-27 NOTE — Telephone Encounter (Signed)
Left message for patient to call and schedule earlier appointment with APP--see Dr. Debara Pickett on 04/05/17 and she needs to be seen sooner.

## 2017-03-29 ENCOUNTER — Encounter: Payer: Self-pay | Admitting: Nurse Practitioner

## 2017-04-03 ENCOUNTER — Encounter: Payer: Self-pay | Admitting: Internal Medicine

## 2017-04-03 ENCOUNTER — Ambulatory Visit (INDEPENDENT_AMBULATORY_CARE_PROVIDER_SITE_OTHER): Payer: Medicare Other | Admitting: Internal Medicine

## 2017-04-03 ENCOUNTER — Encounter (HOSPITAL_COMMUNITY): Payer: Self-pay

## 2017-04-03 ENCOUNTER — Inpatient Hospital Stay (HOSPITAL_COMMUNITY)
Admission: AD | Admit: 2017-04-03 | Discharge: 2017-04-06 | DRG: 291 | Disposition: A | Discharge status: Skilled Nursing Facility | Payer: Medicare Other | Source: Ambulatory Visit | Attending: Internal Medicine | Admitting: Internal Medicine

## 2017-04-03 VITALS — BP 102/66 | HR 61 | Ht 66.0 in | Wt 199.0 lb

## 2017-04-03 DIAGNOSIS — N189 Chronic kidney disease, unspecified: Secondary | ICD-10-CM | POA: Diagnosis not present

## 2017-04-03 DIAGNOSIS — Z930 Tracheostomy status: Secondary | ICD-10-CM | POA: Diagnosis not present

## 2017-04-03 DIAGNOSIS — I130 Hypertensive heart and chronic kidney disease with heart failure and stage 1 through stage 4 chronic kidney disease, or unspecified chronic kidney disease: Secondary | ICD-10-CM | POA: Diagnosis present

## 2017-04-03 DIAGNOSIS — I2510 Atherosclerotic heart disease of native coronary artery without angina pectoris: Secondary | ICD-10-CM | POA: Diagnosis present

## 2017-04-03 DIAGNOSIS — Z7409 Other reduced mobility: Secondary | ICD-10-CM | POA: Diagnosis present

## 2017-04-03 DIAGNOSIS — Z6833 Body mass index (BMI) 33.0-33.9, adult: Secondary | ICD-10-CM

## 2017-04-03 DIAGNOSIS — N184 Chronic kidney disease, stage 4 (severe): Secondary | ICD-10-CM | POA: Diagnosis present

## 2017-04-03 DIAGNOSIS — K219 Gastro-esophageal reflux disease without esophagitis: Secondary | ICD-10-CM | POA: Diagnosis present

## 2017-04-03 DIAGNOSIS — E1122 Type 2 diabetes mellitus with diabetic chronic kidney disease: Secondary | ICD-10-CM | POA: Diagnosis present

## 2017-04-03 DIAGNOSIS — I10 Essential (primary) hypertension: Secondary | ICD-10-CM

## 2017-04-03 DIAGNOSIS — R0600 Dyspnea, unspecified: Secondary | ICD-10-CM

## 2017-04-03 DIAGNOSIS — Z9981 Dependence on supplemental oxygen: Secondary | ICD-10-CM | POA: Diagnosis not present

## 2017-04-03 DIAGNOSIS — D649 Anemia, unspecified: Secondary | ICD-10-CM | POA: Diagnosis present

## 2017-04-03 DIAGNOSIS — E1140 Type 2 diabetes mellitus with diabetic neuropathy, unspecified: Secondary | ICD-10-CM | POA: Diagnosis present

## 2017-04-03 DIAGNOSIS — E785 Hyperlipidemia, unspecified: Secondary | ICD-10-CM | POA: Diagnosis present

## 2017-04-03 DIAGNOSIS — Z7982 Long term (current) use of aspirin: Secondary | ICD-10-CM | POA: Diagnosis not present

## 2017-04-03 DIAGNOSIS — N183 Chronic kidney disease, stage 3 unspecified: Secondary | ICD-10-CM

## 2017-04-03 DIAGNOSIS — Z79899 Other long term (current) drug therapy: Secondary | ICD-10-CM | POA: Diagnosis not present

## 2017-04-03 DIAGNOSIS — R1310 Dysphagia, unspecified: Secondary | ICD-10-CM | POA: Diagnosis present

## 2017-04-03 DIAGNOSIS — Z87891 Personal history of nicotine dependence: Secondary | ICD-10-CM

## 2017-04-03 DIAGNOSIS — I493 Ventricular premature depolarization: Secondary | ICD-10-CM | POA: Diagnosis present

## 2017-04-03 DIAGNOSIS — M1990 Unspecified osteoarthritis, unspecified site: Secondary | ICD-10-CM | POA: Diagnosis present

## 2017-04-03 DIAGNOSIS — I43 Cardiomyopathy in diseases classified elsewhere: Secondary | ICD-10-CM | POA: Diagnosis present

## 2017-04-03 DIAGNOSIS — Z981 Arthrodesis status: Secondary | ICD-10-CM | POA: Diagnosis not present

## 2017-04-03 DIAGNOSIS — Z882 Allergy status to sulfonamides status: Secondary | ICD-10-CM

## 2017-04-03 DIAGNOSIS — I5043 Acute on chronic combined systolic (congestive) and diastolic (congestive) heart failure: Secondary | ICD-10-CM | POA: Diagnosis present

## 2017-04-03 DIAGNOSIS — D631 Anemia in chronic kidney disease: Secondary | ICD-10-CM | POA: Diagnosis present

## 2017-04-03 DIAGNOSIS — N179 Acute kidney failure, unspecified: Secondary | ICD-10-CM | POA: Diagnosis present

## 2017-04-03 DIAGNOSIS — E669 Obesity, unspecified: Secondary | ICD-10-CM | POA: Diagnosis present

## 2017-04-03 DIAGNOSIS — E119 Type 2 diabetes mellitus without complications: Secondary | ICD-10-CM

## 2017-04-03 DIAGNOSIS — Z7984 Long term (current) use of oral hypoglycemic drugs: Secondary | ICD-10-CM

## 2017-04-03 DIAGNOSIS — R609 Edema, unspecified: Secondary | ICD-10-CM

## 2017-04-03 DIAGNOSIS — I5023 Acute on chronic systolic (congestive) heart failure: Secondary | ICD-10-CM | POA: Diagnosis present

## 2017-04-03 LAB — COMPREHENSIVE METABOLIC PANEL
ALT: 33 U/L (ref 14–54)
AST: 29 U/L (ref 15–41)
Albumin: 3.3 g/dL — ABNORMAL LOW (ref 3.5–5.0)
Alkaline Phosphatase: 127 U/L — ABNORMAL HIGH (ref 38–126)
Anion gap: 9 (ref 5–15)
BUN: 52 mg/dL — ABNORMAL HIGH (ref 6–20)
CO2: 26 mmol/L (ref 22–32)
CREATININE: 1.9 mg/dL — AB (ref 0.44–1.00)
Calcium: 9.6 mg/dL (ref 8.9–10.3)
Chloride: 104 mmol/L (ref 101–111)
GFR calc Af Amer: 29 mL/min — ABNORMAL LOW (ref >60)
GFR calc non Af Amer: 25 mL/min — ABNORMAL LOW (ref >60)
GLUCOSE: 122 mg/dL — AB (ref 65–99)
Potassium: 3.8 mmol/L (ref 3.5–5.1)
SODIUM: 139 mmol/L (ref 135–145)
Total Bilirubin: 0.7 mg/dL (ref 0.3–1.2)
Total Protein: 6.9 g/dL (ref 6.5–8.1)

## 2017-04-03 LAB — GLUCOSE, CAPILLARY: Glucose-Capillary: 123 mg/dL — ABNORMAL HIGH (ref 65–99)

## 2017-04-03 LAB — BASIC METABOLIC PANEL
BUN: 51 mg/dL — AB (ref 4–21)
Creatinine: 1.9 mg/dL — AB (ref 0.5–1.1)
GLUCOSE: 101 mg/dL
Potassium: 3.8 mmol/L (ref 3.4–5.3)
SODIUM: 142 mmol/L (ref 137–147)

## 2017-04-03 LAB — CBC WITH DIFFERENTIAL/PLATELET
Basophils Absolute: 0 10*3/uL (ref 0.0–0.1)
Basophils Relative: 1 %
EOS ABS: 0.1 10*3/uL (ref 0.0–0.7)
Eosinophils Relative: 3 %
HCT: 30.2 % — ABNORMAL LOW (ref 36.0–46.0)
Hemoglobin: 9.7 g/dL — ABNORMAL LOW (ref 12.0–15.0)
LYMPHS ABS: 1.3 10*3/uL (ref 0.7–4.0)
LYMPHS PCT: 32 %
MCH: 29.2 pg (ref 26.0–34.0)
MCHC: 32.1 g/dL (ref 30.0–36.0)
MCV: 91 fL (ref 78.0–100.0)
Monocytes Absolute: 0.5 10*3/uL (ref 0.1–1.0)
Monocytes Relative: 13 %
Neutro Abs: 2 10*3/uL (ref 1.7–7.7)
Neutrophils Relative %: 51 %
Platelets: 135 10*3/uL — ABNORMAL LOW (ref 150–400)
RBC: 3.32 MIL/uL — ABNORMAL LOW (ref 3.87–5.11)
RDW: 17.7 % — ABNORMAL HIGH (ref 11.5–15.5)
WBC: 4 10*3/uL (ref 4.0–10.5)

## 2017-04-03 LAB — TSH: TSH: 3.169 u[IU]/mL (ref 0.350–4.500)

## 2017-04-03 LAB — HEMOGLOBIN A1C: HEMOGLOBIN A1C: 6.4

## 2017-04-03 LAB — MAGNESIUM: Magnesium: 2 mg/dL (ref 1.7–2.4)

## 2017-04-03 LAB — MRSA PCR SCREENING: MRSA BY PCR: NEGATIVE

## 2017-04-03 MED ORDER — HEPARIN SODIUM (PORCINE) 5000 UNIT/ML IJ SOLN
5000.0000 [IU] | Freq: Three times a day (TID) | INTRAMUSCULAR | Status: DC
  Administered 2017-04-03 – 2017-04-06 (×8): 5000 [IU] via SUBCUTANEOUS
  Filled 2017-04-03 (×8): qty 1

## 2017-04-03 MED ORDER — BISACODYL 10 MG RE SUPP: 10.0000 mg | Freq: Once | RECTAL | Status: DC | PRN

## 2017-04-03 MED ORDER — ONDANSETRON HCL 4 MG/2ML IJ SOLN: 4.0000 mg | Freq: Four times a day (QID) | INTRAMUSCULAR | Status: DC | PRN

## 2017-04-03 MED ORDER — SODIUM CHLORIDE 0.9 % IV SOLN
250.0000 mL | INTRAVENOUS | Status: DC | PRN
Start: 2017-04-03 — End: 2017-04-06

## 2017-04-03 MED ORDER — SODIUM CHLORIDE 0.9% FLUSH: 3.0000 mL | INTRAVENOUS | Status: DC | PRN

## 2017-04-03 MED ORDER — METOPROLOL TARTRATE 25 MG PO TABS
25.0000 mg | ORAL_TABLET | Freq: Two times a day (BID) | ORAL | Status: DC
Start: 2017-04-03 — End: 2017-04-06
  Administered 2017-04-03 – 2017-04-06 (×6): 25 mg via ORAL
  Filled 2017-04-03 (×6): qty 1

## 2017-04-03 MED ORDER — PANTOPRAZOLE SODIUM 40 MG PO TBEC
40.0000 mg | DELAYED_RELEASE_TABLET | Freq: Every day | ORAL | Status: DC
Start: 2017-04-03 — End: 2017-04-06
  Administered 2017-04-03 – 2017-04-06 (×4): 40 mg via ORAL
  Filled 2017-04-03 (×4): qty 1

## 2017-04-03 MED ORDER — ADULT MULTIVITAMIN W/MINERALS CH
1.0000 | ORAL_TABLET | Freq: Every morning | ORAL | Status: DC
  Administered 2017-04-04 – 2017-04-06 (×3): 1 via ORAL
  Filled 2017-04-03 (×4): qty 1

## 2017-04-03 MED ORDER — ASPIRIN 81 MG PO CHEW
81.0000 mg | CHEWABLE_TABLET | Freq: Every day | ORAL | Status: DC
  Administered 2017-04-04 – 2017-04-06 (×3): 81 mg via ORAL
  Filled 2017-04-03 (×3): qty 1

## 2017-04-03 MED ORDER — GLIMEPIRIDE 1 MG PO TABS
1.0000 mg | ORAL_TABLET | Freq: Every day | ORAL | Status: DC
  Administered 2017-04-04 – 2017-04-06 (×3): 1 mg via ORAL
  Filled 2017-04-03 (×3): qty 1

## 2017-04-03 MED ORDER — ACETAMINOPHEN 325 MG PO TABS: 650.0000 mg | ORAL_TABLET | Freq: Four times a day (QID) | ORAL | Status: DC | PRN

## 2017-04-03 MED ORDER — SODIUM CHLORIDE 0.9% FLUSH
3.0000 mL | Freq: Two times a day (BID) | INTRAVENOUS | Status: DC
  Administered 2017-04-03 – 2017-04-06 (×6): 3 mL via INTRAVENOUS

## 2017-04-03 MED ORDER — INSULIN ASPART 100 UNIT/ML ~~LOC~~ SOLN
0.0000 [IU] | Freq: Three times a day (TID) | SUBCUTANEOUS | Status: DC
  Administered 2017-04-04 – 2017-04-05 (×3): 3 [IU] via SUBCUTANEOUS
  Administered 2017-04-06: 2 [IU] via SUBCUTANEOUS

## 2017-04-03 MED ORDER — IRBESARTAN 75 MG PO TABS
37.5000 mg | ORAL_TABLET | Freq: Every day | ORAL | Status: DC
  Administered 2017-04-04 – 2017-04-06 (×3): 37.5 mg via ORAL
  Filled 2017-04-03 (×3): qty 0.5

## 2017-04-03 MED ORDER — NORTRIPTYLINE HCL 10 MG PO CAPS
10.0000 mg | ORAL_CAPSULE | Freq: Every day | ORAL | Status: DC
  Administered 2017-04-03 – 2017-04-05 (×3): 10 mg via ORAL
  Filled 2017-04-03 (×3): qty 1

## 2017-04-03 MED ORDER — FERROUS SULFATE 325 (65 FE) MG PO TABS
325.0000 mg | ORAL_TABLET | Freq: Every day | ORAL | Status: DC
  Administered 2017-04-04 – 2017-04-06 (×3): 325 mg via ORAL
  Filled 2017-04-03 (×3): qty 1

## 2017-04-03 MED ORDER — FUROSEMIDE 10 MG/ML IJ SOLN
80.0000 mg | Freq: Two times a day (BID) | INTRAMUSCULAR | Status: DC
  Administered 2017-04-03 – 2017-04-05 (×4): 80 mg via INTRAVENOUS
  Filled 2017-04-03 (×4): qty 8

## 2017-04-03 NOTE — Patient Instructions (Signed)
Dr. Debara Pickett has ordered a hospital admission for treatment of your heart failure.  The hospital will call [facility] when a bed is ready.  Helene Kelp will arrange transport.

## 2017-04-03 NOTE — H&P (Signed)
ADMISSION HISTORY & PHYSICAL  Patient Name: Mandy Rhodes Date of Encounter: 04/03/2017 Primary Care Physician: Harlan Stains, MD Cardiologist: Dr. [facility] Problem List   Acute systolic congestive heart failure  Chief Complaint    Weight gain, fatigue  HPI  This is a 76 y.o. female with a past medical history significant for acute combined systolic and diastolic heart failure. She was hospitalized in the Fall of 2017 and again in January 2018. Echocardiogram revealed a newly reduced EF of 35-40% with severe LVH was started on IV Lasix and diuresed. Her left heart catheterization at the time revealed a 20% proximal LAD lesion Percent ostial ramus. Echocardiogram was suggestive of infiltrative cardiomyopathy of the septum. She also has Stage III chronic kidney disease. She was discharged to [facility] living in rehabilitation and has had several episodes of acute systolic congestive heart failure there. This is required increasing doses of diuretics. Recently she was seen by Dr. Linna Darner who started her on metolazone in addition to her Lasix. She was referred today for urgent follow-up given significant weight gain greater than 20 pounds. Discharge weight in January 2018 was 196 pounds, but she recently weight 224 pounds. Laboratory work from 03/24/2017 indicated a BNP greater than 10,000 and after diuretics were initiated and increased her creatinine rose from 1.4 up to 1.7. Despite the increase in her diuretics 2 weeks ago she remains volume overloaded. We were unable to get accurate weight on her in the office today due to her limited mobility although she mentioned that her weight was up 2 pounds at assisted living. She says she is not active enough to be short of breath but does have trouble sleeping at night. She feels fatigue. Her legs are very heavy. She denies any chest pain.  PMHx   Past Medical History:  Diagnosis Date  . Anemia   . Arthritis   . CAD (coronary artery  disease)    a. minimal CAD by cath in 03/2016  . CHF (congestive heart failure) ([place])    a. 09/2016: Echo with EF of 30-35%, moderate diffuse HK, Grade 2 DD, PA peak pressure of 39 mm Hg.  Marland Kitchen Chronic kidney disease    Renal Insuffiency, see [facility] once a year  . Diabetes mellitus    type 2  . GERD (gastroesophageal reflux disease)   . History of rotator cuff tear    Torn right rotator cuff.  . History of spinal stenosis   . Hypertension   . Infiltrative cardiomyopathy ([place])    a, presumed cardiac amyloidosis by MRI with prior nondiagnostic fat pad biopsy  . Neuropathy associated with endocrine disorder [facility])     Past Surgical History:  Procedure Laterality Date  . ABDOMINAL HYSTERECTOMY  1971   Partial  . ANTERIOR CERVICAL DECOMP/DISCECTOMY FUSION N/A 10/18/2016   Procedure: debridement and drainage cervical prevertebral abscess  ANTERIOR CERVICAL HARDWARE REMOVAL exploration cervical fusion;  Surgeon: Earnie Larsson, MD;  Location: [place];  Service: Neurosurgery;  Laterality: N/A;  . APPENDECTOMY    . BACK SURGERY  2009   Disectomy  . CARDIAC CATHETERIZATION N/A 03/22/2016   Procedure: Left Heart Cath and Coronary Angiography;  Surgeon: Peter M Martinique, MD;  Location: [place] [facility];  Service: Cardiovascular;  Laterality: N/A;  . CHOLECYSTECTOMY  1969   Status post   . CYST REMOVAL TRUNK Right 12/03/2014   Procedure: EXCISION OF RIGHT BACK CYST ;  Surgeon: Coralie Keens, MD;  Location: [place];  Service:  General;  Laterality: Right;  . DIRECT LARYNGOSCOPY N/A 10/17/2016   Procedure: DIRECT LARYNGOSCOPY,  ESOPHAGOSCOPY;  Surgeon: [name], MD;  Location: [facility];  Service: ENT;  Laterality: N/A;  . FOOT SURGERY Bilateral    bone removed  . IR GENERIC HISTORICAL  10/25/2016   IR GASTROSTOMY TUBE MOD SED 10/25/2016 [name], MD [facility]  . NEUROPLASTY / TRANSPOSITION MEDIAN NERVE AT CARPAL TUNNEL Bilateral    Hx  Left carpal tunnel repair  . TRACHEOSTOMY TUBE  PLACEMENT N/A 10/17/2016   Procedure: TRACHEOSTOMY;  Surgeon: [name], MD;  Location: [facility];  Service: ENT;  Laterality: N/A;    FAMHx   Family History  Problem Relation Age of Onset  . Diabetes type II Mother   . Hypertension Father   . Diabetes type II Sister   . Ovarian cancer Sister   . Diabetes type II Other   . Stroke Neg Hx   . CAD Neg Hx   . Heart failure Neg Hx     SOCHx    reports that she has quit smoking. She has never used smokeless tobacco. She reports that she does not drink alcohol or use drugs.  Outpatient Medications   No current facility-administered medications on file prior to encounter.    Current Outpatient Prescriptions on File Prior to Encounter  Medication Sig Dispense Refill  . acetaminophen (TYLENOL) 325 MG tablet Take 650 mg by mouth every 6 (six) hours as needed for mild pain.    Marland Kitchen aspirin 81 MG chewable tablet Chew 81 mg by mouth daily.     . bisacodyl (DULCOLAX) 10 MG suppository Place 10 mg rectally once as needed (if no relief from Milk of Magnesia).    . Calcium Carb-Cholecalciferol (CALCIUM 600 + D PO) Take 1 tablet by mouth every morning.     . cholecalciferol (VITAMIN D) 1000 UNITS tablet Take 1,000 Units by mouth daily.     . ferrous sulfate 325 (65 FE) MG EC tablet Take 325 mg by mouth daily.     . furosemide (LASIX) 40 MG tablet Take 40 mg by mouth daily.    Marland Kitchen glimepiride (AMARYL) 1 MG tablet Take 1 mg by mouth daily.    . irbesartan (AVAPRO) 75 MG tablet Take 37.5 mg by mouth daily.     . magnesium hydroxide (MILK OF MAGNESIA) 400 MG/5ML suspension Place 30 mLs into feeding tube once as needed (if no bowel movement in 3 days).     . metoprolol tartrate (LOPRESSOR) 25 MG tablet Place 1 tablet (25 mg total) into feeding tube 2 (two) times daily. (Patient taking differently: Take 25 mg by mouth 2 (two) times daily. )    . Multiple Vitamin (TAB-A-VITE) TABS Take 1 tablet by mouth every morning.     . nortriptyline (PAMELOR) 10 MG  capsule Take 10 mg by mouth at bedtime.     Marland Kitchen omeprazole (PRILOSEC) 40 MG capsule 40 mg See admin instructions. Per feeding tube once a day    . OXYGEN Inhale 2 L into the lungs daily as needed (if O2 stats are <90%).     . Sodium Phosphates (RA SALINE ENEMA) 19-7 GM/118ML ENEM Place 1 enema rectally once as needed (if constipation is not relieved by Dulcolax suppository).    . Water For Irrigation, Sterile (FREE WATER) SOLN Place 100 mLs into feeding tube See admin instructions. 100 cc Q 8 hrs only (Patient not taking: Reported on 04/03/2017)      Inpatient Medications  None   ALLERGIES   Allergies  Allergen Reactions  . Sulfa Antibiotics Swelling    ROS   Pertinent items noted in HPI and remainder of comprehensive ROS otherwise negative.  Vitals   Vitals:   04/03/17 1612  BP: 118/74  Pulse: 63  Resp: 18  Temp: 97.7 F (36.5 C)  TempSrc: Oral  SpO2: 97%  Weight: 222 lb (100.7 kg)  Height: 5\' 6"  (1.676 m)   @IOBRIEF @ [employer]   04/03/17 1612  Weight: 222 lb (100.7 kg)    Physical Exam   General appearance: alert, no distress and morbidly obese Neck: JVD - 5 cm above sternal notch, no carotid bruit and thyroid not enlarged, symmetric, no tenderness/mass/nodules Lungs: diminished breath sounds bilaterally Heart: regular rate and rhythm Abdomen: protuberant, firm Extremities: edema 3-4+ firm stasis edema Pulses: diminished bilaterally Skin: Skin color, texture, turgor normal. No rashes or lesions Neurologic: Mental status: Alert, oriented, thought content appropriate, weak, difficulty bearing weight Psych: Pleasant  Labs   Results for orders placed or performed in visit on 04/03/17 (from the past 48 hour(s))  Basic metabolic panel     Status: Abnormal   Collection Time: 04/03/17 12:00 AM  Result Value Ref Range   Glucose 101 mg/dL   BUN 51 (A) 4 - 21 mg/dL   Creatinine 1.9 (A) 0.5 - 1.1 mg/dL   Potassium 3.8 3.4 - 5.3 mmol/L   Sodium 142 137 - 147  mmol/L  Hemoglobin A1c     Status: None   Collection Time: 04/03/17 12:00 AM  Result Value Ref Range   Hemoglobin A1C 6.4     ECG   Sinus rhythm with frequent PVCs at 61 - Personally Reviewed  Telemetry   None  Radiology   No results found.  Cardiac Studies   None  Assessment   1. Acute systolic congestive heart failure 2. AKI on CKD  Plan   1. Mandy Rhodes has acute systolic congestive heart failure with over 30 pound weight gain in the past several weeks. This is been resistant to diuretics with no improvement on Lasix and bumetanide. She will likely need IV diuretics. I'm also concerned about her rising creatinine in the face of diuretics which may make outpatient diuresis difficult. Her BNP was greater than 10,000 indicating significant volume overload. Despite this she is not extremely short of breath although she is almost totally inactive. She is very weak and has difficulty in supporting her weight. I advised admission for IV diuretics and she is agreeable to this. We will arrange for direct admission to the cardiology service.  Time Spent Directly with Patient:  25 minutes  Length of Stay: @RRHLOS @  Pixie Casino, MD, [place]  Attending Cardiologist  Direct Dial: [phone]  Fax: [phone]  Website: [email or web address]   Nadean Corwin Demetrice Combes 04/03/2017, 5:22 PM

## 2017-04-03 NOTE — Progress Notes (Signed)
ADMISSION HISTORY & PHYSICAL  Patient Name: Mandy Rhodes Date of Encounter: 04/03/2017 Primary Care Physician: Harlan Stains, MD Cardiologist: Dr. [facility] Problem List   Acute systolic congestive heart failure  Chief Complaint    Weight gain, fatigue  HPI  This is a 76 y.o. female with a past medical history significant for acute combined systolic and diastolic heart failure. She was hospitalized in the Fall of 2017 and again in January 2018. Echocardiogram revealed a newly reduced EF of 35-40% with severe LVH was started on IV Lasix and diuresed. Her left heart catheterization at the time revealed a 20% proximal LAD lesion Percent ostial ramus. Echocardiogram was suggestive of infiltrative cardiomyopathy of the septum. She also has Stage III chronic kidney disease. She was discharged to [facility] living in rehabilitation and has had several episodes of acute systolic congestive heart failure there. This is required increasing doses of diuretics. Recently she was seen by Dr. Linna Darner who started her on metolazone in addition to her Lasix. She was referred today for urgent follow-up given significant weight gain greater than 20 pounds. Discharge weight in January 2018 was 196 pounds, but she recently weight 224 pounds. Laboratory work from 03/24/2017 indicated a BNP greater than 10,000 and after diuretics were initiated and increased her creatinine rose from 1.4 up to 1.7. Despite the increase in her diuretics 2 weeks ago she remains volume overloaded. We were unable to get accurate weight on her in the office today due to her limited mobility although she mentioned that her weight was up 2 pounds at assisted living. She says she is not active enough to be short of breath but does have trouble sleeping at night. She feels fatigue. Her legs are very heavy. She denies any chest pain.  PMHx   Past Medical History:  Diagnosis Date  . Anemia   . Arthritis   . CAD (coronary artery  disease)    a. minimal CAD by cath in 03/2016  . CHF (congestive heart failure) ([place])    a. 09/2016: Echo with EF of 30-35%, moderate diffuse HK, Grade 2 DD, PA peak pressure of 39 mm Hg.  Marland Kitchen Chronic kidney disease    Renal Insuffiency, see [facility] once a year  . Diabetes mellitus    type 2  . GERD (gastroesophageal reflux disease)   . History of rotator cuff tear    Torn right rotator cuff.  . History of spinal stenosis   . Hypertension   . Infiltrative cardiomyopathy ([place])    a, presumed cardiac amyloidosis by MRI with prior nondiagnostic fat pad biopsy  . Neuropathy associated with endocrine disorder [facility])     Past Surgical History:  Procedure Laterality Date  . ABDOMINAL HYSTERECTOMY  1971   Partial  . ANTERIOR CERVICAL DECOMP/DISCECTOMY FUSION N/A 10/18/2016   Procedure: debridement and drainage cervical prevertebral abscess  ANTERIOR CERVICAL HARDWARE REMOVAL exploration cervical fusion;  Surgeon: Earnie Larsson, MD;  Location: [facility];  Service: Neurosurgery;  Laterality: N/A;  . APPENDECTOMY    . BACK SURGERY  2009   Disectomy  . CARDIAC CATHETERIZATION N/A 03/22/2016   Procedure: Left Heart Cath and Coronary Angiography;  Surgeon: Peter M Martinique, MD;  Location: [facility];  Service: Cardiovascular;  Laterality: N/A;  . CHOLECYSTECTOMY  1969   Status post   . CYST REMOVAL TRUNK Right 12/03/2014   Procedure: EXCISION OF RIGHT BACK CYST ;  Surgeon: Coralie Keens, MD;  Location: [facility];  Service:  General;  Laterality: Right;  . DIRECT LARYNGOSCOPY N/A 10/17/2016   Procedure: DIRECT LARYNGOSCOPY,  ESOPHAGOSCOPY;  Surgeon: [name], MD;  Location: [facility];  Service: ENT;  Laterality: N/A;  . FOOT SURGERY Bilateral    bone removed  . IR GENERIC HISTORICAL  10/25/2016   IR GASTROSTOMY TUBE MOD SED 10/25/2016 [name], MD [facility]-INTERV RAD  . NEUROPLASTY / TRANSPOSITION MEDIAN NERVE AT CARPAL TUNNEL Bilateral    Hx  Left carpal tunnel repair  . TRACHEOSTOMY TUBE  PLACEMENT N/A 10/17/2016   Procedure: TRACHEOSTOMY;  Surgeon: [name], MD;  Location: [facility];  Service: ENT;  Laterality: N/A;    FAMHx   Family History  Problem Relation Age of Onset  . Diabetes type II Mother   . Hypertension Father   . Diabetes type II Sister   . Ovarian cancer Sister   . Diabetes type II Other   . Stroke Neg Hx   . CAD Neg Hx   . Heart failure Neg Hx     SOCHx    reports that she has quit smoking. She has never used smokeless tobacco. She reports that she does not drink alcohol or use drugs.  Outpatient Medications   Current Outpatient Prescriptions on File Prior to Visit  Medication Sig Dispense Refill  . acetaminophen (TYLENOL) 325 MG tablet Take 650 mg by mouth every 6 (six) hours as needed for mild pain.    Marland Kitchen aspirin 81 MG chewable tablet Place 81 mg into feeding tube daily.    . bisacodyl (DULCOLAX) 10 MG suppository Place 10 mg rectally once as needed (if no relief from Milk of Magnesia).    . Calcium Carb-Cholecalciferol (CALCIUM 600 + D PO) Place 1 tablet into feeding tube every morning.     . cholecalciferol (VITAMIN D) 1000 UNITS tablet 1,000 Units See admin instructions. Per feeding tube once a day    . ferrous sulfate 325 (65 FE) MG EC tablet 325 mg See admin instructions. Per feeding tube in the morning    . furosemide (LASIX) 40 MG tablet Take 40 mg by mouth daily.    . Insulin Glargine (LANTUS SOLOSTAR) 100 UNIT/ML Solostar Pen Inject 12 Units into the skin daily.    . irbesartan (AVAPRO) 75 MG tablet Place 37.5 mg into feeding tube daily.    . magnesium hydroxide (MILK OF MAGNESIA) 400 MG/5ML suspension Place 30 mLs into feeding tube once as needed (if no bowel movement in 3 days).     . metoprolol tartrate (LOPRESSOR) 25 MG tablet Place 1 tablet (25 mg total) into feeding tube 2 (two) times daily.    . Multiple Vitamin (TAB-A-VITE) TABS Place 1 tablet into feeding tube every morning.    . nortriptyline (PAMELOR) 10 MG capsule Place 10  mg into feeding tube at bedtime.     Marland Kitchen omeprazole (PRILOSEC) 40 MG capsule 40 mg See admin instructions. Per feeding tube once a day    . OXYGEN Inhale 2 L into the lungs daily as needed (if O2 stats are <90%).     . Sodium Phosphates (RA SALINE ENEMA) 19-7 GM/118ML ENEM Place 1 enema rectally once as needed (if constipation is not relieved by Dulcolax suppository).    . Water For Irrigation, Sterile (FREE WATER) SOLN Place 100 mLs into feeding tube See admin instructions. 100 cc Q 8 hrs only     No current facility-administered medications on file prior to visit.     Inpatient Medications    None  ALLERGIES   Allergies  Allergen Reactions  . Sulfa Antibiotics Swelling    ROS   Pertinent items noted in HPI and remainder of comprehensive ROS otherwise negative.  Vitals   Vitals:   04/03/17 0827  BP: 102/66  Pulse: 61  Weight: 199 lb (90.3 kg)  Height: 5\' 6"  (1.676 m)   @IOBRIEF @ [employer]   04/03/17 0827  Weight: 199 lb (90.3 kg)    Physical Exam   General appearance: alert, no distress and morbidly obese Neck: JVD - 5 cm above sternal notch, no carotid bruit and thyroid not enlarged, symmetric, no tenderness/mass/nodules Lungs: diminished breath sounds bilaterally Heart: regular rate and rhythm Abdomen: protuberant, firm Extremities: edema 3-4+ firm stasis edema Pulses: diminished bilaterally Skin: Skin color, texture, turgor normal. No rashes or lesions Neurologic: Mental status: Alert, oriented, thought content appropriate, weak, difficulty bearing weight Psych: Pleasant  Labs   No results found for this or any previous visit (from the past 48 hour(s)).  ECG   Sinus rhythm with frequent PVCs at 61 - Personally Reviewed  Telemetry   None  Radiology   No results found.  Cardiac Studies   None  Assessment   1. Acute systolic congestive heart failure 2. AKI on CKD  Plan   1. Mrs. Faw has acute systolic congestive heart failure with  over 30 pound weight gain in the past several weeks. This is been resistant to diuretics with no improvement on Lasix and bumetanide. She will likely need IV diuretics. I'm also concerned about her rising creatinine in the face of diuretics which may make outpatient diuresis difficult. Her BNP was greater than 10,000 indicating significant volume overload. Despite this she is not extremely short of breath although she is almost totally inactive. She is very weak and has difficulty in supporting her weight. I advised admission for IV diuretics and she is agreeable to this. We will arrange for direct admission to the cardiology service.  Time Spent Directly with Patient:  25 minutes  Length of Stay: @RRHLOS @  Pixie Casino, MD, [place]  Attending Cardiologist  Direct Dial: [phone]  Fax: [phone]  Website: [email or web address] 04/03/2017, 10:58 AM

## 2017-04-04 ENCOUNTER — Inpatient Hospital Stay (HOSPITAL_COMMUNITY): Payer: Medicare Other

## 2017-04-04 DIAGNOSIS — N179 Acute kidney failure, unspecified: Secondary | ICD-10-CM

## 2017-04-04 DIAGNOSIS — N189 Chronic kidney disease, unspecified: Secondary | ICD-10-CM

## 2017-04-04 LAB — BASIC METABOLIC PANEL
ANION GAP: 10 (ref 5–15)
BUN: 53 mg/dL — ABNORMAL HIGH (ref 6–20)
CALCIUM: 9.7 mg/dL (ref 8.9–10.3)
CO2: 27 mmol/L (ref 22–32)
Chloride: 104 mmol/L (ref 101–111)
Creatinine, Ser: 2.02 mg/dL — ABNORMAL HIGH (ref 0.44–1.00)
GFR calc Af Amer: 27 mL/min — ABNORMAL LOW (ref >60)
GFR, EST NON AFRICAN AMERICAN: 23 mL/min — AB (ref >60)
Glucose, Bld: 119 mg/dL — ABNORMAL HIGH (ref 65–99)
Potassium: 3.8 mmol/L (ref 3.5–5.1)
Sodium: 141 mmol/L (ref 135–145)

## 2017-04-04 LAB — GLUCOSE, CAPILLARY
GLUCOSE-CAPILLARY: 123 mg/dL — AB (ref 65–99)
GLUCOSE-CAPILLARY: 54 mg/dL — AB (ref 65–99)
Glucose-Capillary: 168 mg/dL — ABNORMAL HIGH (ref 65–99)
Glucose-Capillary: 163 mg/dL — ABNORMAL HIGH (ref 65–99)
Glucose-Capillary: 76 mg/dL (ref 65–99)

## 2017-04-04 NOTE — Progress Notes (Signed)
Progress Note  Patient Name: Mandy Rhodes Date of Encounter: 04/04/2017  Primary Cardiologist: Dr. Debara Pickett  Subjective   Pt denies shortness of breath at rest but has orthopnea. She does not ambulate and breathing with exertion is not assessed. She feels that her legs are a not as heavy. No chest pain.  Inpatient Medications    Scheduled Meds: . aspirin  81 mg Oral Daily  . ferrous sulfate  325 mg Oral Daily  . furosemide  80 mg Intravenous BID  . glimepiride  1 mg Oral Daily  . heparin  5,000 Units Subcutaneous Q8H  . insulin aspart  0-15 Units Subcutaneous TID WC  . irbesartan  37.5 mg Oral Daily  . metoprolol tartrate  25 mg Oral BID  . multivitamin with minerals  1 tablet Oral q morning - 10a  . nortriptyline  10 mg Oral QHS  . pantoprazole  40 mg Oral Daily  . sodium chloride flush  3 mL Intravenous Q12H   Continuous Infusions: . sodium chloride     PRN Meds: sodium chloride, acetaminophen, bisacodyl, ondansetron (ZOFRAN) IV, sodium chloride flush   Vital Signs    Vitals:   04/04/17 0054 04/04/17 0617 04/04/17 1012 04/04/17 1224  BP: 119/66 (!) 123/54 129/71 106/76  Pulse: 75 69 75 65  Resp: 18   18  Temp: 98.1 F (36.7 C) 98.5 F (36.9 C)    TempSrc: Oral Oral    SpO2: 94% 96%  96%  Weight:  219 lb 14.4 oz (99.7 kg)    Height:        Intake/Output Summary (Last 24 hours) at 04/04/17 1356 Last data filed at 04/04/17 1223  Gross per 24 hour  Intake              720 ml  Output              875 ml  Net             -155 ml   Filed Weights   04/03/17 1612 04/04/17 0617  Weight: 222 lb (100.7 kg) 219 lb 14.4 oz (99.7 kg)    Telemetry    Sinus rhythm in the 70's with frequent PVCs - Personally Reviewed  ECG    Sinus rhythm with PVCs, 61 bpm, low voltage - Personally Reviewed  Physical Exam   GEN: No acute distress. Obese AA female, chronically ill appearing  Neck: No JVD Cardiac: RRR, no murmurs, rubs, or gallops.  Respiratory: Clear to  auscultation bilaterally, diminished GI: Soft, nontender, non-distended, PEG tube  MS: 1-2+ edema; No deformity. Neuro:  Nonfocal  Psych: Normal affect   Labs    Chemistry Recent Labs Lab 04/03/17 04/03/17 1913 04/04/17 0620  NA 142 139 141  K 3.8 3.8 3.8  CL  --  104 104  CO2  --  26 27  GLUCOSE  --  122* 119*  BUN 51* 52* 53*  CREATININE 1.9* 1.90* 2.02*  CALCIUM  --  9.6 9.7  PROT  --  6.9  --   ALBUMIN  --  3.3*  --   AST  --  29  --   ALT  --  33  --   ALKPHOS  --  127*  --   BILITOT  --  0.7  --   GFRNONAA  --  25* 23*  GFRAA  --  29* 27*  ANIONGAP  --  9 10     Hematology Recent Labs Lab 04/03/17 1913  WBC 4.0  RBC 3.32*  HGB 9.7*  HCT 30.2*  MCV 91.0  MCH 29.2  MCHC 32.1  RDW 17.7*  PLT 135*    Cardiac EnzymesNo results for input(s): TROPONINI in the last 168 hours. No results for input(s): TROPIPOC in the last 168 hours.   BNPNo results for input(s): BNP, PROBNP in the last 168 hours.   DDimer No results for input(s): DDIMER in the last 168 hours.   Radiology    Dg Chest Port 1 View  Result Date: 04/04/2017 CLINICAL DATA:  Shortness of breath beginning today. EXAM: PORTABLE CHEST 1 VIEW COMPARISON:  11/18/2016 FINDINGS: Marked enlargement of the cardiac silhouette could be cardiomegaly and/or pericardial fluid. Pulmonary venous hypertension without edema. Mild atelectasis in the lower lobes left more than right. Possible small effusion on the left. IMPRESSION: Congestive heart failure. Enlarged cardiac silhouette. Pulmonary venous hypertension. Possible small left effusion. Electronically Signed   By: Nelson Chimes M.D.   On: 04/04/2017 07:26    Cardiac Studies   Echo 10/13/2016 Study Conclusions  - Left ventricle: The cavity size was normal. There was moderate   concentric hypertrophy. The appearance was consistent with   hypertensive changes or infiltrative cardiomyopathy, such as   amyloidosis. Systolic function was moderately to  severely   reduced. The estimated ejection fraction was in the range of 30%   to 35%. Moderate diffuse hypokinesis with no identifiable   regional variations. Features are consistent with a pseudonormal   left ventricular filling pattern, with concomitant abnormal   relaxation and increased filling pressure (grade 2 diastolic   dysfunction). - Mitral valve: Valve area by pressure half-time: 2.42 cm^2. - Left atrium: The atrium was moderately dilated. - Pulmonary arteries: Systolic pressure was mildly increased. PA   peak pressure: 39 mm Hg (S).   Patient Profile     76 y.o. female with a past medical history significant for acute combined systolic and diastolic heart failure, CKD stage III, DM type 2, surgery for spinal stenosis with subsequent hardware infection, S/P esophagus damage and PEG tube(has started back eating). She had newly diagnosed reduction in EF of 35-40% 03/2016. Cardiac cath 03/22/16 showed only minor non-obstructive CAD. She has been treated with diuretics and was hospitalized in fall 2017 and January 2018. She was discharged to [facility] rehabilitation facility. She has required escalating doses of diuretics. Now comes in with greateer than 20 pound wt gain, edema and fluid overload. She was admitted from the office.   Assessment & Plan    Acute on chronic systolic heart failure -Non-ischemic cardiomyopathy -Greater than 30 pound wt gain over the past several weeks, resistant to diuretics, lasix and bumex and addition of metolazone. She does not appear to have that much extra fluid. No shortness of breath at rest and is very sedentary-unknown if DOE, but has orthopnea.  -BNP 10,683 on 5/5. Will recheck in am.  -CXR: Congestive heart failure. Enlarged cardiac silhouette. Pulmonary venous hypertension. Possible small left effusion. -Lasix 80 mg IV BID. Has had 2 doses. Wt down 3 pounds.  I&O is not accurate. -Continue with aggressive diuresis with IV lasix. Monitor kidney  function and electrolytes.  CKD stage III -SCr on admission 1.90. Today 2.02 with IV diuresis.  Anemia -Hgb 9.7. Baselin is 8-9 over the last year. -Likely anemia of chronic disease and CKD  Signed, Daune Perch, NP  04/04/2017, 1:56 PM    I have seen and examined the patient along with Daune Perch, NP.  I have  reviewed the chart, notes and new data.  I agree with NP's note.  Key new complaints: breathing greatly improved Key examination changes: trivial dependent edema. Lying almost fully flat in bed without O2, no distress, no JVD or S3 Key new findings / data: markedly elevated BNP last week, but creat now a little abov baseline  PLAN: I suspect that the previously established dry weight of 194 lb in January is no longer applicable. She was very sick then, with a poor appetite, requiring liquid nutritional supplements. Since March she is eating much better. I suspect she has gained real weight. At most she appears to a few pounds hypervolemic. Probably switch to PO diuretics tomorrow, maybe DC on Friday. Recheck BNP in AM.  Sanda Klein, MD, [place] [phone] 04/04/2017, 4:23 PM

## 2017-04-04 NOTE — Care Management Note (Signed)
Case Management Note  Patient Details  Name: Mandy Rhodes MRN: [id number] Date of Birth: 04-Jan-1941  Subjective/Objective:      Admitted with Acute CHF             Action/Plan: Patient resides at [facility] as prior to admission; SW following for disposition needs/ return to facility.  Expected Discharge Date:    possibly 04/08/2017              Expected Discharge Plan:  [place]  Discharge planning Services  CM Consult  Status of Service:  In process, will continue to follow  Sherrilyn Rist [phone] 04/04/2017, 10:56 AM

## 2017-04-05 ENCOUNTER — Ambulatory Visit: Payer: Medicare Other | Admitting: Internal Medicine

## 2017-04-05 LAB — GLUCOSE, CAPILLARY
GLUCOSE-CAPILLARY: 98 mg/dL (ref 65–99)
GLUCOSE-CAPILLARY: 173 mg/dL — AB (ref 65–99)
Glucose-Capillary: 104 mg/dL — ABNORMAL HIGH (ref 65–99)
Glucose-Capillary: 173 mg/dL — ABNORMAL HIGH (ref 65–99)

## 2017-04-05 LAB — BASIC METABOLIC PANEL
ANION GAP: 11 (ref 5–15)
BUN: 50 mg/dL — ABNORMAL HIGH (ref 6–20)
CALCIUM: 9.8 mg/dL (ref 8.9–10.3)
CO2: 29 mmol/L (ref 22–32)
CREATININE: 2.11 mg/dL — AB (ref 0.44–1.00)
Chloride: 102 mmol/L (ref 101–111)
GFR calc Af Amer: 25 mL/min — ABNORMAL LOW (ref >60)
GFR, EST NON AFRICAN AMERICAN: 22 mL/min — AB (ref >60)
Glucose, Bld: 90 mg/dL (ref 65–99)
Potassium: 3.7 mmol/L (ref 3.5–5.1)
Sodium: 142 mmol/L (ref 135–145)

## 2017-04-05 LAB — BRAIN NATRIURETIC PEPTIDE: B Natriuretic Peptide: 1576.4 pg/mL — ABNORMAL HIGH (ref 0.0–100.0)

## 2017-04-05 MED ORDER — FUROSEMIDE 40 MG PO TABS: 60.0000 mg | ORAL_TABLET | Freq: Every day | ORAL | Status: DC

## 2017-04-05 MED ORDER — FUROSEMIDE 40 MG PO TABS
60.0000 mg | ORAL_TABLET | Freq: Every day | ORAL | Status: DC
  Administered 2017-04-06: 60 mg via ORAL
  Filled 2017-04-05: qty 1

## 2017-04-05 NOTE — Progress Notes (Addendum)
Progress Note  Patient Name: Mandy Rhodes Date of Encounter: 04/05/2017  Primary Cardiologist: Dr. Debara Pickett  Subjective   Pt was up with PT this morning. Her breathing is much better and her legs are less heavy. No chest pain.   Inpatient Medications    Scheduled Meds: . aspirin  81 mg Oral Daily  . ferrous sulfate  325 mg Oral Daily  . furosemide  80 mg Intravenous BID  . glimepiride  1 mg Oral Daily  . heparin  5,000 Units Subcutaneous Q8H  . insulin aspart  0-15 Units Subcutaneous TID WC  . irbesartan  37.5 mg Oral Daily  . metoprolol tartrate  25 mg Oral BID  . multivitamin with minerals  1 tablet Oral q morning - 10a  . nortriptyline  10 mg Oral QHS  . pantoprazole  40 mg Oral Daily  . sodium chloride flush  3 mL Intravenous Q12H   Continuous Infusions: . sodium chloride     PRN Meds: sodium chloride, acetaminophen, bisacodyl, ondansetron (ZOFRAN) IV, sodium chloride flush   Vital Signs    Vitals:   04/04/17 1224 04/04/17 1930 04/05/17 0447 04/05/17 0750  BP: 106/76 110/75 127/84 134/73  Pulse: 65 71 72 83  Resp: 18 16 15 16   Temp:  98.2 F (36.8 C) 98.1 F (36.7 C)   TempSrc:  Oral Oral   SpO2: 96% 97% 98%   Weight:   217 lb (98.4 kg)   Height:        Intake/Output Summary (Last 24 hours) at 04/05/17 1048 Last data filed at 04/05/17 0941  Gross per 24 hour  Intake              960 ml  Output             3100 ml  Net            -2140 ml   Filed Weights   04/03/17 1612 04/04/17 0617 04/05/17 0447  Weight: 222 lb (100.7 kg) 219 lb 14.4 oz (99.7 kg) 217 lb (98.4 kg)    Telemetry    Sinus rhythm in the 70's with frequent PVCs - Personally Reviewed  ECG    No new tracings  Physical Exam   GEN: No acute distress. Obese AA female, chronically ill appearing  Neck: No JVD Cardiac: RRR, no murmurs, rubs, or gallops.  Respiratory: Clear to auscultation bilaterally, diminished GI: Soft, nontender, non-distended, PEG tube  MS: trace lower  extremity edema; No deformity. Neuro:  Nonfocal  Psych: Normal affect   Labs    Chemistry  Recent Labs Lab 04/03/17 1913 04/04/17 0620 04/05/17 0420  NA 139 141 142  K 3.8 3.8 3.7  CL 104 104 102  CO2 26 27 29   GLUCOSE 122* 119* 90  BUN 52* 53* 50*  CREATININE 1.90* 2.02* 2.11*  CALCIUM 9.6 9.7 9.8  PROT 6.9  --   --   ALBUMIN 3.3*  --   --   AST 29  --   --   ALT 33  --   --   ALKPHOS 127*  --   --   BILITOT 0.7  --   --   GFRNONAA 25* 23* 22*  GFRAA 29* 27* 25*  ANIONGAP 9 10 11      Hematology  Recent Labs Lab 04/03/17 1913  WBC 4.0  RBC 3.32*  HGB 9.7*  HCT 30.2*  MCV 91.0  MCH 29.2  MCHC 32.1  RDW 17.7*  PLT 135*  Cardiac EnzymesNo results for input(s): TROPONINI in the last 168 hours. No results for input(s): TROPIPOC in the last 168 hours.   BNP  Recent Labs Lab 04/05/17 0420  BNP 1,576.4*     DDimer No results for input(s): DDIMER in the last 168 hours.   Radiology    Dg Chest Port 1 View  Result Date: 04/04/2017 CLINICAL DATA:  Shortness of breath beginning today. EXAM: PORTABLE CHEST 1 VIEW COMPARISON:  11/18/2016 FINDINGS: Marked enlargement of the cardiac silhouette could be cardiomegaly and/or pericardial fluid. Pulmonary venous hypertension without edema. Mild atelectasis in the lower lobes left more than right. Possible small effusion on the left. IMPRESSION: Congestive heart failure. Enlarged cardiac silhouette. Pulmonary venous hypertension. Possible small left effusion. Electronically Signed   By: Nelson Chimes M.D.   On: 04/04/2017 07:26    Cardiac Studies   Echo 10/13/2016 Study Conclusions  - Left ventricle: The cavity size was normal. There was moderate   concentric hypertrophy. The appearance was consistent with   hypertensive changes or infiltrative cardiomyopathy, such as   amyloidosis. Systolic function was moderately to severely   reduced. The estimated ejection fraction was in the range of 30%   to 35%. Moderate  diffuse hypokinesis with no identifiable   regional variations. Features are consistent with a pseudonormal   left ventricular filling pattern, with concomitant abnormal   relaxation and increased filling pressure (grade 2 diastolic   dysfunction). - Mitral valve: Valve area by pressure half-time: 2.42 cm^2. - Left atrium: The atrium was moderately dilated. - Pulmonary arteries: Systolic pressure was mildly increased. PA   peak pressure: 39 mm Hg (S).   Patient Profile     76 y.o. female with a past medical history significant for acute combined systolic and diastolic heart failure, CKD stage III, DM type 2, surgery for spinal stenosis with subsequent hardware infection, S/P esophagus damage and PEG tube(has started back eating). She had newly diagnosed reduction in EF of 35-40% 03/2016. Cardiac cath 03/22/16 showed only minor non-obstructive CAD. She has been treated with diuretics and was hospitalized in fall 2017 and January 2018. She was discharged to [facility] rehabilitation facility. She has required escalating doses of diuretics. Now comes in with greateer than 20 pound wt gain, edema and fluid overload. She was admitted from the office.   Assessment & Plan    Acute on chronic systolic heart failure -Non-ischemic cardiomyopathy -Pt had increased shortness of breath with her therapy on Monday. She had PT this am and her breathing was better.  -Greater than 30 pound wt gain over the past several weeks, resistant to diuretics, lasix and bumex and addition of metolazone. The patient has had weight loss since spinal surgery with complications and subsequent swallowing difficulty, PEG tube and altered nutrition. She is now eating and weight has likely come up in part due to better nutrition.  -BNP 10,683 on 5/5. BNP 1,576.4 this am. -CXR: Congestive heart failure. Enlarged cardiac silhouette. Pulmonary venous hypertension. Possible small left effusion. -Lasix 80 mg IV BID.  Wt down 5 pounds.   Had 3L urine output yesterday. Will switch to oral diuretics and monitor. Probably home tomorrow.    CKD stage III -SCr on admission 1.90 --> 2.02 --> 2.11. Switch to oral diuretic.  Anemia -Hgb 9.7. Baselin is 8-9 over the last year. -Likely anemia of chronic disease and CKD  Signed, Daune Perch, NP  04/05/2017, 10:48 AM    I have seen and examined the patient along with [facility]  Phylliss Bob, NP.  I have reviewed the chart, notes and new data.  I agree with NP's note.  Key new complaints: markedly improved clinically Key examination changes: no edema, no S3 , normal JVP Key new findings / data: BNP much lower than earlier this month, creatinine inched up  PLAN: Switch to PO diuretics. Possible DC in AM. Reassessed "dry weight" is around 217 lb. She has gained true weight, not just fluid.  Sanda Klein, MD, [place] [phone] 04/05/2017, 11:50 AM

## 2017-04-05 NOTE — Progress Notes (Signed)
Talked to Mardene Celeste at [facility] she stated pt peg tube does not get flushed and they change her dressing every other day because pt rarely has drainage. Will inform MD  Angelissa Supan Leory Plowman

## 2017-04-05 NOTE — Evaluation (Signed)
Physical Therapy Evaluation Patient Details Name: Mandy Rhodes MRN: [id number] DOB: [birth year] Today's Date: 04/05/2017   History of Present Illness  Patient is a 76 y/o female admitted from [facility] due to SOB/CHF.  PMH positive for NSTEMI/VDRF 11/17, ACDF w/ hardware removal and I&D, DM, CHF, CKD, HTN, anemia, R rotator cuff tear, SS, neuropathy and hyperlipidemia.   Clinical Impression  Patient presents with decreased mobility over her baseline due to bedrest over the past day or so.  She is reliant on R LE knee brace and depends on her shoes and elevated surfaces for transfers.  She also has fear of falling so continues to be appropriate for SNF level rehab.  PT to follow acutely.     Follow Up Recommendations SNF    Equipment Recommendations  None recommended by PT    Recommendations for Other Services       Precautions / Restrictions Precautions Precautions: Fall      Mobility  Bed Mobility Overal bed mobility: Needs Assistance Bed Mobility: Supine to Sit     Supine to sit: Mod assist     General bed mobility comments: to elevate trunk and scoot to EOB  Transfers Overall transfer level: Needs assistance Equipment used: Rolling walker (2 wheeled) Transfers: Sit to/from [facility] Sit to Stand: Min guard;From elevated surface Stand pivot transfers: Min guard       General transfer comment: fearful of falling and does not want me to touch her back for fear I will knock her over so guarding A for up from elevated bed after couple attempts and steps around with walker to [facility]  Ambulation/Gait Ambulation/Gait assistance: Min guard Ambulation Distance (Feet): 3 Feet Assistive device: Rolling walker (2 wheeled) Gait Pattern/deviations: Step-to pattern;Wide base of support;Decreased stride length     General Gait Details: relies on lateral weight shift to progress LE's for ambulation to [facility] prior to sitting and return to supine  Stairs             Wheelchair Mobility    Modified Rankin (Stroke Patients Only)       Balance Overall balance assessment: Needs assistance   Sitting balance-Leahy Scale: Good     Standing balance support: Bilateral upper extremity supported Standing balance-Leahy Scale: Poor Standing balance comment: UE support for balance                             Pertinent Vitals/Pain Pain Assessment: Faces Faces Pain Scale: Hurts little more Pain Location: knees stiff Pain Descriptors / Indicators: Tightness Pain Intervention(s): Monitored during session;Repositioned    Home Living Family/patient expects to be discharged to:: Skilled nursing facility                 Additional Comments: [facility]    Prior Function Level of Independence: Needs assistance   Gait / Transfers Assistance Needed: does some ambulation with PT, gets up daily and propels w/c with her feet           Hand Dominance   Dominant Hand: Right    Extremity/Trunk Assessment   Upper Extremity Assessment Upper Extremity Assessment: RUE deficits/detail RUE Deficits / Details: limited shoulder elevation with rotator cuff tear    Lower Extremity Assessment Lower Extremity Assessment: RLE deficits/detail;LLE deficits/detail RLE Deficits / Details: AROM limited with pain at times in knee, strength hip flexion 3/5, knee extension 4+/5; wears brace on L knee LLE Deficits / Details: AROM WFL, strength  hip flexion 3/5, knee extension 4+/5    Cervical / Trunk Assessment Cervical / Trunk Assessment: Other exceptions Cervical / Trunk Exceptions: stiffness noted in neck  Communication   Communication: No difficulties  Cognition Arousal/Alertness: Awake/alert Behavior During Therapy: WFL for tasks assessed/performed Overall Cognitive Status: Within Functional Limits for tasks assessed                                        General Comments      Exercises      Assessment/Plan    PT Assessment Patient needs continued PT services  PT Problem List Decreased strength;Decreased mobility;Decreased balance;Decreased knowledge of use of DME;Decreased activity tolerance       PT Treatment Interventions DME instruction;Functional mobility training;Gait training;Therapeutic exercise;Patient/family education;Therapeutic activities;Balance training;Wheelchair mobility training    PT Goals (Current goals can be found in the Care Plan section)  Acute Rehab PT Goals Patient Stated Goal: To return to [facility] prior to home PT Goal Formulation: With patient Time For Goal Achievement: 04/12/17 Potential to Achieve Goals: Good    Frequency Min 2X/week   Barriers to discharge        Co-evaluation               AM-PAC PT "6 Clicks" Daily Activity  Outcome Measure Difficulty turning over in bed (including adjusting bedclothes, sheets and blankets)?: A Little Difficulty moving from lying on back to sitting on the side of the bed? : Total Difficulty sitting down on and standing up from a chair with arms (e.g., wheelchair, bedside commode, etc,.)?: Total Help needed moving to and from a bed to chair (including a wheelchair)?: A Little Help needed walking in hospital room?: A Little Help needed climbing 3-5 steps with a railing? : Total 6 Click Score: 12    End of Session Equipment Utilized During Treatment: Gait belt Activity Tolerance: Patient tolerated treatment well Patient left: in bed;with call bell/phone within reach Nurse Communication: Mobility status PT Visit Diagnosis: Muscle weakness (generalized) (M62.81);Other abnormalities of gait and mobility (R26.89)    Time: 1093-2355 PT Time Calculation (min) (ACUTE ONLY): 42 min   Charges:   PT Evaluation $PT Eval Moderate Complexity: 1 Procedure PT Treatments $Gait Training: 8-22 mins $Therapeutic Activity: 8-22 mins   PT G CodesMagda Kiel,  [name] [phone] 04/05/2017   [name] 04/05/2017, 12:44 PM

## 2017-04-05 NOTE — Progress Notes (Signed)
MD returned page stated to call [facility]. [facility] talked to [place] the Network engineer and left a call back number for the nurse. Will try again later   Mandy Rhodes

## 2017-04-05 NOTE — Clinical Social Work Note (Signed)
Clinical Social Work Assessment  Patient Details  Name: Mandy Rhodes MRN: [id number] Date of Birth: [birth year]  Date of referral:  04/05/17               Reason for consult:  Discharge Planning                Permission sought to share information with:  Chartered certified accountant granted to share information::  Yes, Verbal Permission Granted  Name::        Agency::  [facility]  Relationship::     Contact Information:     Housing/Transportation Living arrangements for the past 2 months:  [place] of Information:  Patient, Scientist, water quality, Facility Patient Interpreter Needed:  None Criminal Activity/Legal Involvement Pertinent to Current Situation/Hospitalization:  No - Comment as needed Significant Relationships:  Adult Children, Other Family Members, Friend Lives with:  Facility Resident Do you feel safe going back to the place where you live?  Yes Need for family participation in patient care:  Yes (Comment)  Care giving concerns:  Patient is a long-term resident at [facility].   Social Worker assessment / plan:  CSW met with patient. No supports at bedside. CSW introduced role and explained that discharge planning would be discussed. Patient confirmed that she was admitted from [facility] and plans to return once stable for discharge. Patient stated that she has been there since December 2017 but says she is only a short-term resident. The admissions coordinator at the facility, however, states that she is long-term. No further concerns. CSW encouraged patient to contact CSW as needed. CSW will continue to follow patient for support and facilitate discharge to SNF once medically stable.  Employment status:  Retired Nurse, adult PT Recommendations:  Not assessed at this time Information / Referral to community resources:  [place]  Patient/Family's Response to care:  Patient agreeable to  return to SNF. Patient's family supportive and involved in patient's care. Patient appreciated social work intervention.  Patient/Family's Understanding of and Emotional Response to Diagnosis, Current Treatment, and Prognosis:  Patient understands the reason for admission and her need to return to SNF once discharged. Patient appears happy with hospital care.  Emotional Assessment Appearance:  Appears stated age Attitude/Demeanor/Rapport:  Other (Pleasant) Affect (typically observed):  Accepting, Appropriate, Calm, Pleasant Orientation:  Oriented to Self, Oriented to Place, Oriented to  Time, Oriented to Situation Alcohol / Substance use:  Never Used Psych involvement (Current and /or in the community):  No (Comment)  Discharge Needs  Concerns to be addressed:  Care Coordination Readmission within the last 30 days:  No Current discharge risk:  Dependent with Mobility Barriers to Discharge:  Continued Medical Work up   Candie Chroman, LCSW 04/05/2017, 10:56 AM

## 2017-04-05 NOTE — NC FL2 (Signed)
[place] LEVEL OF CARE SCREENING TOOL     IDENTIFICATION  Patient Name: Mandy Rhodes Birthdate: Nov 13, 1941 Sex: female Admission Date (Current Location): 04/03/2017  [facility] and [place] Number:  Herbalist and Address:  [facility], [place] [address], [place], [place] [address]      Provider Number: [id number]  Attending Physician Name and Address:  Pixie Casino, MD  Relative Name and Phone Number:       Current Level of Care: Hospital Recommended Level of Care: [place] Prior Approval Number:    Date Approved/Denied:   PASRR Number: [id number]  Discharge Plan: SNF    Current Diagnoses: Patient Active Problem List   Diagnosis Date Noted  . Acute on chronic diastolic CHF (congestive heart failure) ([place]) 04/03/2017  . Acute on chronic systolic (congestive) heart failure ([place]) 04/03/2017  . Chronic knee pain 03/13/2017  . Mental status change resolved 11/24/2016  . UTI due to extended-spectrum beta lactamase (ESBL) producing Escherichia coli 11/22/2016  . Dysphagia 11/01/2016  . Esophageal perforation   . Vertebral osteomyelitis ([place])   . Laryngeal mass 10/16/2016  . Anemia 10/16/2016  . Demand ischemia ([place]) 10/16/2016  . PAT (paroxysmal atrial tachycardia) ([place]) 10/14/2016  . Malnutrition of moderate degree 10/14/2016  . Acute respiratory failure with hypoxia ([place]) 10/13/2016  . Pressure injury of skin 10/13/2016  . Mass   . Acute kidney injury superimposed on CKD ([place])   . Acute on chronic combined systolic and diastolic CHF (congestive heart failure) ([place])   . Edema 08/23/2016  . Non-ischemic cardiomyopathy ([place]) 07/14/2016  . Hyperlipidemia 07/14/2016  . Cardiomegaly 03/17/2016  . CKD (chronic kidney disease) stage 3, GFR 30-59 ml/min 03/17/2016  . Essential hypertension 03/17/2016  . Diabetes mellitus ([place]) 09/29/2011    Orientation RESPIRATION BLADDER Height & Weight     Self, Time,  Situation, Place  Normal Continent Weight: 217 lb (98.4 kg) Height:  5\' 6"  (167.6 cm)  BEHAVIORAL SYMPTOMS/MOOD NEUROLOGICAL BOWEL NUTRITION STATUS   (None)  (None)  (Gastrostomy) Feeding tube, Diet (Peg/Heart healthy carb modified)  AMBULATORY STATUS COMMUNICATION OF NEEDS Skin     Verbally Normal                       Personal Care Assistance Level of Assistance              Functional Limitations Info  Sight, Hearing, Speech Sight Info: Adequate Hearing Info: Adequate Speech Info: Adequate    SPECIAL CARE FACTORS FREQUENCY  Blood pressure                    Contractures Contractures Info: Not present    Additional Factors Info  Code Status, Allergies, Isolation Precautions Code Status Info: DNR Allergies Info: Sulfa Antibiotics     Isolation Precautions Info: Contact: ESBL     Current Medications (04/05/2017):  This is the current hospital active medication list Current Facility-Administered Medications  Medication Dose Route Frequency Provider Last Rate Last Dose  . 0.9 %  sodium chloride infusion  250 mL Intravenous PRN Rogelia Mire, NP      . acetaminophen (TYLENOL) tablet 650 mg  650 mg Oral Q6H PRN Rogelia Mire, NP      . aspirin chewable tablet 81 mg  81 mg Oral Daily Rogelia Mire, NP   81 mg at 04/05/17 1048  . bisacodyl (DULCOLAX) suppository 10 mg  10 mg Rectal  Once PRN Rogelia Mire, NP      . ferrous sulfate tablet 325 mg  325 mg Oral Daily Rogelia Mire, NP   325 mg at 04/05/17 1048  . furosemide (LASIX) injection 80 mg  80 mg Intravenous BID Rogelia Mire, NP   80 mg at 04/05/17 0755  . glimepiride (AMARYL) tablet 1 mg  1 mg Oral Daily Murray Hodgkins R, NP   1 mg at 04/05/17 1048  . heparin injection 5,000 Units  5,000 Units Subcutaneous Q8H Rogelia Mire, NP   5,000 Units at 04/05/17 0301  . insulin aspart (novoLOG) injection 0-15 Units  0-15 Units Subcutaneous TID WC Rogelia Mire, NP   3 Units at 04/04/17 1630  . irbesartan (AVAPRO) tablet 37.5 mg  37.5 mg Oral Daily Murray Hodgkins R, NP   37.5 mg at 04/05/17 1049  . metoprolol tartrate (LOPRESSOR) tablet 25 mg  25 mg Oral BID Rogelia Mire, NP   25 mg at 04/05/17 1048  . multivitamin with minerals tablet 1 tablet  1 tablet Oral q morning - 10a Rogelia Mire, NP   1 tablet at 04/05/17 1048  . nortriptyline (PAMELOR) capsule 10 mg  10 mg Oral QHS Rogelia Mire, NP   10 mg at 04/04/17 2143  . ondansetron (ZOFRAN) injection 4 mg  4 mg Intravenous Q6H PRN Rogelia Mire, NP      . pantoprazole (PROTONIX) EC tablet 40 mg  40 mg Oral Daily Murray Hodgkins R, NP   40 mg at 04/05/17 1048  . sodium chloride flush (NS) 0.9 % injection 3 mL  3 mL Intravenous Q12H Murray Hodgkins R, NP   3 mL at 04/05/17 1000  . sodium chloride flush (NS) 0.9 % injection 3 mL  3 mL Intravenous PRN Rogelia Mire, NP         Discharge Medications: Please see discharge summary for a list of discharge medications.  Relevant Imaging Results:  Relevant Lab Results:   Additional Information SS#: [id number]  Candie Chroman, LCSW

## 2017-04-05 NOTE — Progress Notes (Signed)
Paged MD regarding pt having PEG tube and no order to flush q 4hour. Awaiting call back  Dov Dill Leory Plowman

## 2017-04-06 ENCOUNTER — Telehealth: Payer: Self-pay | Admitting: Student

## 2017-04-06 LAB — BASIC METABOLIC PANEL
Anion gap: 11 (ref 5–15)
BUN: 46 mg/dL — ABNORMAL HIGH (ref 6–20)
CALCIUM: 9.8 mg/dL (ref 8.9–10.3)
CO2: 26 mmol/L (ref 22–32)
CREATININE: 2.13 mg/dL — AB (ref 0.44–1.00)
Chloride: 104 mmol/L (ref 101–111)
GFR calc non Af Amer: 22 mL/min — ABNORMAL LOW (ref >60)
GFR, EST AFRICAN AMERICAN: 25 mL/min — AB (ref >60)
GLUCOSE: 95 mg/dL (ref 65–99)
Potassium: 3.6 mmol/L (ref 3.5–5.1)
Sodium: 141 mmol/L (ref 135–145)

## 2017-04-06 LAB — GLUCOSE, CAPILLARY
Glucose-Capillary: 89 mg/dL (ref 65–99)
Glucose-Capillary: 149 mg/dL — ABNORMAL HIGH (ref 65–99)

## 2017-04-06 MED ORDER — IRBESARTAN 75 MG PO TABS: 37.5000 mg | ORAL_TABLET | Freq: Every day | ORAL | 3 refills | Status: DC

## 2017-04-06 MED ORDER — FUROSEMIDE 20 MG PO TABS: 60.0000 mg | ORAL_TABLET | Freq: Every day | ORAL | 1 refills | Status: DC

## 2017-04-06 NOTE — Progress Notes (Signed)
Report called to [place], spoke to [place]. Mechele Claude stated they were not using her PEG tube, informed that AVS states to take medications via peg tube but MD stated to resume route prior. Informed that pt has been taking medications hole in applesauce. Pt IV discontinued catheter intact and telemetry removed. Awaiting PTAR for transportation   [place]

## 2017-04-06 NOTE — Clinical Social Work Note (Addendum)
CSW facilitated patient discharge including contacting facility to confirm patient discharge plans. Patient declined to have CSW call family, stating they were already aware that she is discharging today. Clinical information faxed to facility and family agreeable with plan. CSW arranged ambulance transport via PT[facility] to [place]. RN to call report prior to discharge [phone]).  CSW will sign off for now as social work intervention is no longer needed. Please consult [place] again if new needs arise.  Dayton Scrape, CSW [phone]  1:54 pm CSW asked NP to sign DNR for PTAR. CSW called facility to confirm that patient is a DNR and admissions coordinator had called her daughter today and she stated that the patient is a full code.  Dayton Scrape, [place]

## 2017-04-06 NOTE — Telephone Encounter (Signed)
TCM Phone Cal-Please call pt-Pt has an appointment with Bernerd Pho on 04-19-17.

## 2017-04-06 NOTE — Progress Notes (Signed)
Paged MD regarding pt discharge medication list states per PEG tube route  [place] Garlan Fillers

## 2017-04-06 NOTE — Progress Notes (Signed)
Progress Note  Patient Name: Mandy Rhodes Date of Encounter: 04/06/2017  Primary Cardiologist: Dr. Debara Pickett  Subjective   Pt continues to improve in her breathing and is almost at her baseline.  Edema is better, still puffiness in ankles.    Inpatient Medications    Scheduled Meds: . aspirin  81 mg Oral Daily  . ferrous sulfate  325 mg Oral Daily  . furosemide  60 mg Oral Daily  . glimepiride  1 mg Oral Daily  . heparin  5,000 Units Subcutaneous Q8H  . insulin aspart  0-15 Units Subcutaneous TID WC  . irbesartan  37.5 mg Oral Daily  . metoprolol tartrate  25 mg Oral BID  . multivitamin with minerals  1 tablet Oral q morning - 10a  . nortriptyline  10 mg Oral QHS  . pantoprazole  40 mg Oral Daily  . sodium chloride flush  3 mL Intravenous Q12H   Continuous Infusions: . sodium chloride     PRN Meds: sodium chloride, acetaminophen, bisacodyl, ondansetron (ZOFRAN) IV, sodium chloride flush   Vital Signs    Vitals:   04/05/17 1148 04/05/17 2100 04/06/17 0427 04/06/17 0900  BP: 120/74 134/66 124/72 118/82  Pulse: 65 73 70 63  Resp: 18 18 17 16   Temp: 97.9 F (36.6 C) 98.4 F (36.9 C) 98.2 F (36.8 C)   TempSrc: Oral Oral Oral   SpO2: 97% 98% 96%   Weight:   205 lb 14.4 oz (93.4 kg)   Height:        Intake/Output Summary (Last 24 hours) at 04/06/17 0951 Last data filed at 04/06/17 0600  Gross per 24 hour  Intake              340 ml  Output             1825 ml  Net            -1485 ml   Filed Weights   04/04/17 0617 04/05/17 0447 04/06/17 0427  Weight: 219 lb 14.4 oz (99.7 kg) 217 lb (98.4 kg) 205 lb 14.4 oz (93.4 kg)    Telemetry    Sinus rhythm in the 60's-70's with occ PVCs - Personally Reviewed  ECG    No new tracings  Physical Exam   GEN: No acute distress. Obese AA female, chronically ill appearing  Neck: No JVD Cardiac: RRR, no murmurs, rubs, or gallops.  Respiratory: Clear to auscultation bilaterally, diminished GI: Soft, nontender,  non-distended, PEG tube  MS: trace ankle extremity edema; No deformity. Neuro:  Nonfocal  Psych: Normal affect   Labs    Chemistry  Recent Labs Lab 04/03/17 1913 04/04/17 0620 04/05/17 0420 04/06/17 0612  NA 139 141 142 141  K 3.8 3.8 3.7 3.6  CL 104 104 102 104  CO2 26 27 29 26   GLUCOSE 122* 119* 90 95  BUN 52* 53* 50* 46*  CREATININE 1.90* 2.02* 2.11* 2.13*  CALCIUM 9.6 9.7 9.8 9.8  PROT 6.9  --   --   --   ALBUMIN 3.3*  --   --   --   AST 29  --   --   --   ALT 33  --   --   --   ALKPHOS 127*  --   --   --   BILITOT 0.7  --   --   --   GFRNONAA 25* 23* 22* 22*  GFRAA 29* 27* 25* 25*  ANIONGAP 9 10 11  11  Hematology  Recent Labs Lab 04/03/17 1913  WBC 4.0  RBC 3.32*  HGB 9.7*  HCT 30.2*  MCV 91.0  MCH 29.2  MCHC 32.1  RDW 17.7*  PLT 135*    Cardiac EnzymesNo results for input(s): TROPONINI in the last 168 hours. No results for input(s): TROPIPOC in the last 168 hours.   BNP  Recent Labs Lab 04/05/17 0420  BNP 1,576.4*     DDimer No results for input(s): DDIMER in the last 168 hours.   Radiology    No results found.  Cardiac Studies   Echo 10/13/2016 Study Conclusions  - Left ventricle: The cavity size was normal. There was moderate   concentric hypertrophy. The appearance was consistent with   hypertensive changes or infiltrative cardiomyopathy, such as   amyloidosis. Systolic function was moderately to severely   reduced. The estimated ejection fraction was in the range of 30%   to 35%. Moderate diffuse hypokinesis with no identifiable   regional variations. Features are consistent with a pseudonormal   left ventricular filling pattern, with concomitant abnormal   relaxation and increased filling pressure (grade 2 diastolic   dysfunction). - Mitral valve: Valve area by pressure half-time: 2.42 cm^2. - Left atrium: The atrium was moderately dilated. - Pulmonary arteries: Systolic pressure was mildly increased. PA   peak  pressure: 39 mm Hg (S).   Patient Profile     76 y.o. female with a past medical history significant for acute combined systolic and diastolic heart failure, CKD stage III, DM type 2, surgery for spinal stenosis with subsequent hardware infection, S/P esophagus damage and PEG tube(has started back eating). She had newly diagnosed reduction in EF of 35-40% 03/2016. Cardiac cath 03/22/16 showed only minor non-obstructive CAD. She has been treated with diuretics and was hospitalized in fall 2017 and January 2018. She was discharged to [facility] rehabilitation facility. She has required escalating doses of diuretics. Now comes in with greateer than 20 pound wt gain, edema and fluid overload. She was admitted from the office.   Assessment & Plan    Acute on chronic systolic heart failure -Non-ischemic cardiomyopathy -Breathing and edema improved  -Greater than 30 pound wt gain over the past several weeks, resistant to diuretics, lasix and bumex and addition of metolazone. The patient has had weight loss since spinal surgery with complications and subsequent swallowing difficulty, PEG tube and altered nutrition. She is now eating and weight has likely come up in part due to better nutrition.  -BNP 10,683 on 5/5. BNP 1,576.4 5/17. -CXR: Congestive heart failure. Enlarged cardiac silhouette. Pulmonary venous hypertension. Possible small left effusion. -Lasix changed to oral dosing.  Wt down 12? Pounds to 205.  Had 2.4L urine output yesterday. -Plan discharge today  CKD stage III -SCr on admission 1.90 --> 2.02 --> 2.11-->2.13.   Anemia -Hgb 9.7. Baselin is 8-9 over the last year. -Likely anemia of chronic disease and CKD  Signed, Daune Perch, NP  04/06/2017, 9:51 AM    I have seen and examined the patient along with Daune Perch, NP.  I have reviewed the chart, notes and new data.  I agree with NP's note.  Key new complaints: no dyspnea at rest or walking in room. At [facility] she only walks  a few steps, uses wheelchair for longer distances. Key examination changes: edema has resolved. No JVD Key new findings / data: creatinine is essentially stable  PLAN: DC to [place]. Weigh daily, including on arrival there. Not sure how accurate our weights  have been here (unlikely she lost 12 lb overnight), but the scale where she lives will be our gold standard. Call Cardiology if weight increases >3 lb in 24 h, 5 lb/week. Low Na diet. Furosemide 60 mg daily.  Sanda Klein, MD, [place] [phone] 04/06/2017, 1:19 PM

## 2017-04-06 NOTE — Progress Notes (Addendum)
MD returned page and stated to have pt resume medications as prior to admission, MD stated no change to AVS orders    Sol Englert Leory Plowman

## 2017-04-06 NOTE — Progress Notes (Signed)
PTAR at bedside. PTAR has pt AVS and transport paper work from social work, report given. Pt has all belongings. IV discontinued, catheter intact and telemetry removed.    Mandy Rhodes

## 2017-04-06 NOTE — Discharge Summary (Signed)
Discharge Summary    Patient ID: Mandy Rhodes,  MRN: [id number], DOB/AGE: 1941/06/04 76 y.o.  Admit date: 04/03/2017 Discharge date: 04/06/2017  Primary Care Provider: Harlan Stains Primary Cardiologist: Dr.Hilty  Discharge Diagnoses    Principal Problem:   Acute on chronic combined systolic and diastolic CHF (congestive heart failure) ([place]) Active Problems:   Diabetes mellitus ([place])   CKD (chronic kidney disease) stage 3, GFR 30-59 ml/min   Essential hypertension   Hyperlipidemia   Acute kidney injury superimposed on CKD (HCC)   Acute on chronic diastolic CHF (congestive heart failure) (HCC)   Acute on chronic systolic (congestive) heart failure (HCC)   Allergies Allergies  Allergen Reactions  . Sulfa Antibiotics Swelling    Diagnostic Studies/Procedures    None _____________   History of Present Illness     Mandy Rhodes is a 76 y.o. female with a past medical history significant for acute combined systolic and diastolic heart failure. She was hospitalized in the Fall of 2017 and again in January 2018. Echocardiogram revealed a newly reduced EF of 35-40% with severe LVH was started on IV Lasix and diuresed. Her left heart catheterization at the time revealed a 20% proximal LAD lesion Percent ostial ramus. Echocardiogram was suggestive of infiltrative cardiomyopathy of the septum. She also has Stage III chronic kidney disease. She was discharged to [facility] living in rehabilitation and has had several episodes of acute systolic congestive heart failure there. This is required increasing doses of diuretics. Recently she was seen by Dr. Linna Darner who started her on metolazone in addition to her Lasix. She was referred on 04/03/17 to cardiology for urgent follow-up given significant weight gain greater than 20 pounds. Discharge weight in January 2018 was 196 pounds, but she recently weight 224 pounds. Laboratory work from 03/24/2017 indicated a BNP greater than 10,000 and  after diuretics were initiated and increased her creatinine rose from 1.4 up to 1.7. Despite the increase in her diuretics 2 weeks ago she remains volume overloaded. We were unable to get accurate weight on her in the office today due to her limited mobility although she mentioned that her weight was up 2 pounds at assisted living. She says she is not active enough to be short of breath but does have trouble sleeping at night. She feels fatigue. Her legs are very heavy. She denies any chest pain.  Of note the patient has had weight loss since spinal surgery with complications and subsequent swallowing difficulty, PEG tube and altered nutrition. She is now eating and weight has likely come up in part due to better nutrition.   Hospital Course     Consultants: None  Chest x-ray showed congestive heart failure. The patient has been diuresed with IV Lasix 80 mg twice a day. Weight has come down (? 12 pounds since yesterday, likely not accurate as patient does not appear to be significantly changed from yesterday), edema has improved and breathing has improved. On 03/24/17 the patient's BNP was over 10,000. Repeat BNP on 5/17 after diuresis was 1576. The patient has chronic renal insufficiency and serum creatinine has remained stable with serum creatinine level of 2.13 today, potassium is 3.6.  New dry weight is actually around 217 pounds. We will discharge back to [facility] facility. Monitor for increased edema, breathing difficulty, orthopnea, and weight. We'll discharge on Lasix 60 mg daily. Patient will need basic metabolic panel to assess electrolytes and kidney function on Tuesday at [place].  Patient has been seen by Dr.  Croitoru today and deemed ready for discharge home. All follow up appointments have been scheduled. Discharge medications are listed below. _____________  Discharge Vitals Blood pressure 120/71, pulse 84, temperature 98.7 F (37.1 C), temperature source Oral, resp. rate 18, height  5\' 6"  (1.676 m), weight 205 lb 14.4 oz (93.4 kg), SpO2 96 %.  Filed Weights   04/04/17 0617 04/05/17 0447 04/06/17 0427  Weight: 219 lb 14.4 oz (99.7 kg) 217 lb (98.4 kg) 205 lb 14.4 oz (93.4 kg)    Labs & Radiologic Studies    CBC  Recent Labs  04/03/17 1913  WBC 4.0  NEUTROABS 2.0  HGB 9.7*  HCT 30.2*  MCV 91.0  PLT 024*   Basic Metabolic Panel  Recent Labs  04/03/17 1913  04/05/17 0420 04/06/17 0612  NA 139  < > 142 141  K 3.8  < > 3.7 3.6  CL 104  < > 102 104  CO2 26  < > 29 26  GLUCOSE 122*  < > 90 95  BUN 52*  < > 50* 46*  CREATININE 1.90*  < > 2.11* 2.13*  CALCIUM 9.6  < > 9.8 9.8  MG 2.0  --   --   --   < > = values in this interval not displayed. Liver Function Tests  Recent Labs  04/03/17 1913  AST 29  ALT 33  ALKPHOS 127*  BILITOT 0.7  PROT 6.9  ALBUMIN 3.3*   No results for input(s): LIPASE, AMYLASE in the last 72 hours. Cardiac Enzymes No results for input(s): CKTOTAL, CKMB, CKMBINDEX, TROPONINI in the last 72 hours. BNP Invalid input(s): POCBNP D-Dimer No results for input(s): DDIMER in the last 72 hours. Hemoglobin A1C No results for input(s): HGBA1C in the last 72 hours. Fasting Lipid Panel No results for input(s): CHOL, HDL, LDLCALC, TRIG, CHOLHDL, LDLDIRECT in the last 72 hours. Thyroid Function Tests  Recent Labs  04/03/17 1913  TSH 3.169   _____________  Dg Chest Port 1 View  Result Date: 04/04/2017 CLINICAL DATA:  Shortness of breath beginning today. EXAM: PORTABLE CHEST 1 VIEW COMPARISON:  11/18/2016 FINDINGS: Marked enlargement of the cardiac silhouette could be cardiomegaly and/or pericardial fluid. Pulmonary venous hypertension without edema. Mild atelectasis in the lower lobes left more than right. Possible small effusion on the left. IMPRESSION: Congestive heart failure. Enlarged cardiac silhouette. Pulmonary venous hypertension. Possible small left effusion. Electronically Signed   By: Nelson Chimes M.D.   On: 04/04/2017  07:26   Disposition   Pt is being discharged home today in good condition.  Follow-up Plans & Appointments     Contact information for follow-up providers    Erma Heritage, PA-C Follow up on 04/19/2017.   Specialties:  Physician Assistant, Cardiology Why:  at at 8:00 for cardiology hospital follow up.  Contact information: [place] [id number] [phone]            Contact information for after-discharge care    Destination    [facility] Follow up.   Specialty:  [place] information: [address] [phone]                 Discharge Instructions    (HEART FAILURE PATIENTS) Call MD:  Anytime you have any of the following symptoms: 1) 3 pound weight gain in 24 hours or 5 pounds in 1 week 2) shortness of breath, with or without a dry  hacking cough 3) swelling in the hands, feet or stomach 4) if you have to sleep on extra pillows at night in order to breathe.    Complete by:  As directed    Diet - low sodium heart healthy    Complete by:  As directed    Heart Failure patients record your daily weight using the same scale at the same time of day    Complete by:  As directed    Increase activity slowly    Complete by:  As directed    STOP any activity that causes chest pain, shortness of breath, dizziness, sweating, or exessive weakness    Complete by:  As directed       Discharge Medications   Current Discharge Medication List    CONTINUE these medications which have CHANGED   Details  furosemide (LASIX) 20 MG tablet Take 3 tablets (60 mg total) by mouth daily. Qty: 30 tablet, Refills: 1    irbesartan (AVAPRO) 75 MG tablet Take 0.5 tablets (37.5 mg total) by mouth daily. Qty: 15 tablet, Refills: 3      CONTINUE these medications which have NOT CHANGED   Details  acetaminophen (TYLENOL) 325 MG tablet Take 650 mg by mouth every 6  (six) hours as needed for mild pain.    aspirin 81 MG chewable tablet Chew 81 mg by mouth daily.     bisacodyl (DULCOLAX) 10 MG suppository Place 10 mg rectally once as needed (if no relief from Milk of Magnesia).    Calcium Carb-Cholecalciferol (CALCIUM 600 + D PO) Take 1 tablet by mouth every morning.     cholecalciferol (VITAMIN D) 1000 UNITS tablet Take 1,000 Units by mouth daily.     ferrous sulfate 325 (65 FE) MG EC tablet Take 325 mg by mouth daily.     glimepiride (AMARYL) 1 MG tablet Take 1 mg by mouth daily.    magnesium hydroxide (MILK OF MAGNESIA) 400 MG/5ML suspension Place 30 mLs into feeding tube once as needed (if no bowel movement in 3 days).     metoprolol tartrate (LOPRESSOR) 25 MG tablet Place 1 tablet (25 mg total) into feeding tube 2 (two) times daily.    Multiple Vitamin (TAB-A-VITE) TABS Take 1 tablet by mouth every morning.     nortriptyline (PAMELOR) 10 MG capsule Take 10 mg by mouth at bedtime.     omeprazole (PRILOSEC) 40 MG capsule 40 mg See admin instructions. Per feeding tube once a day    OXYGEN Inhale 2 L into the lungs daily as needed (if O2 stats are <90%).     Sodium Phosphates (RA SALINE ENEMA) 19-7 GM/118ML ENEM Place 1 enema rectally once as needed (if constipation is not relieved by Dulcolax suppository).    Water For Irrigation, Sterile (FREE WATER) SOLN Place 100 mLs into feeding tube See admin instructions. 100 cc Q 8 hrs only          Outstanding Labs/Studies   Needs basic metabolic panel to assess potassium and kidney function on Tuesday.  Duration of Discharge Encounter   Greater than 30 minutes including physician time.  Signed, Daune Perch NP 04/06/2017, 1:09 PM

## 2017-04-09 ENCOUNTER — Encounter: Payer: Self-pay | Admitting: Nurse Practitioner

## 2017-04-09 ENCOUNTER — Ambulatory Visit (INDEPENDENT_AMBULATORY_CARE_PROVIDER_SITE_OTHER): Payer: Medicare Other | Admitting: Nurse Practitioner

## 2017-04-09 VITALS — BP 98/58 | HR 70 | Ht 66.0 in | Wt 208.0 lb

## 2017-04-09 DIAGNOSIS — K229 Disease of esophagus, unspecified: Secondary | ICD-10-CM

## 2017-04-09 NOTE — Telephone Encounter (Signed)
Left message to cal back, per [facility]

## 2017-04-09 NOTE — Telephone Encounter (Signed)
Patient contacted regarding discharge from [place] on 04/06/17.  Patient understands to follow up with provider B. Ahmed Prima, PA on Apr 19, 2017 at 0800 at [facility]. Patient understands discharge instructions? YES Patient understands medications and regiment? YES Patient understands to bring all medications to this visit? YES

## 2017-04-09 NOTE — Patient Instructions (Signed)
You have been scheduled for a Barium Esophogram at [facility] on Friday 04-13-2017 at 9:30 am. Please arrive at 9:15 minutes prior to your appointment for registration. Make certain not to have anything to eat or drink 3 hours prior to your test. If you need to reschedule for any reason, please contact radiology at [phone] to do so. __________________________________________________________________ A barium swallow is an examination that concentrates on views of the esophagus. This tends to be a double contrast exam (barium and two liquids which, when combined, create a gas to distend the wall of the oesophagus) or single contrast (non-ionic iodine based). The study is usually tailored to your symptoms so a good history is essential. Attention is paid during the study to the form, structure and configuration of the esophagus, looking for functional disorders (such as aspiration, dysphagia, achalasia, motility and reflux) EXAMINATION You may be asked to change into a gown, depending on the type of swallow being performed. A radiologist and radiographer will perform the procedure. The radiologist will advise you of the type of contrast selected for your procedure and direct you during the exam. You will be asked to stand, sit or lie in several different positions and to hold a small amount of fluid in your mouth before being asked to swallow while the imaging is performed .In some instances you may be asked to swallow barium coated marshmallows to assess the motility of a solid food bolus. The exam can be recorded as a digital or video fluoroscopy procedure. POST PROCEDURE It will take 1-2 days for the barium to pass through your system. To facilitate this, it is important, unless otherwise directed, to increase your fluids for the next 24-48hrs and to resume your normal diet.  This test typically takes about 30 minutes to  perform. __________________________________________________________________________________

## 2017-04-09 NOTE — Telephone Encounter (Signed)
F/u Call: patient returning call, thanks.

## 2017-04-09 NOTE — Progress Notes (Signed)
HPI:  Patient is a 76 year old female from [place] facility with multiple medical problems not limited to diabetes, CKD, chronic systolic and diastolic heart failure. She is referred by PCP Dr. Unice Cobble for abnormal MBSS.  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 The hardware was removed and a prevertebral abscess drained.  We were asked to place a PEG but recommended TNA or surgically place G-tube to avoid instrumentation of esophagus. IR ended up placing the G-tube.  Patient is able to provide a good history. She has a G-tube left in place "just in case" but nutrition mainly comes from oral intake. She has no problems swallowing anything but sausage. She has no problems with potatoes or bread.  At [facility] her meats continue to be chopped but while in the hospital last week for heart failure her meat wasn't chopped and she did fine with it. She has no difficulty with pills. She denies weight loss or vomiting. She had a MBSS mid April which showed improved swallowing function compared to the one in February. There was a mild delay in swallowing initiation, high penetration of thin liquids, no aspiration. The primary deficit appeared to be related to decreased patency of the cervical esophagus. Mild residual remained near the upper esophageal sphincter with backflow of materials reaching the pyriform sinus. Esophageal sweep revealed barium stasis. Patient was felt to be high risk for materials back flowing into the pyriforms with spillage into the airway. It was recommended that she limit bolus size/rate to minimize risk. GI evaluation was recommended for evaluation of esophageal stasis.      Past Medical History:  Diagnosis Date  . Anemia   . Arthritis   . CAD (coronary artery disease)    a. minimal CAD by cath in 03/2016  . CHF (congestive heart failure) ([place])    a. 09/2016: Echo with EF of  30-35%, moderate diffuse HK, Grade 2 DD, PA peak pressure of 39 mm Hg.  Marland Kitchen Chronic kidney disease    Renal Insuffiency, see [facility] once a year  . Diabetes mellitus    type 2  . GERD (gastroesophageal reflux disease)   . History of rotator cuff tear    Torn right rotator cuff.  . History of spinal stenosis   . Hypertension   . Infiltrative cardiomyopathy ([place])    a, presumed cardiac amyloidosis by MRI with prior nondiagnostic fat pad biopsy  . Neuropathy associated with endocrine disorder [facility])     Past Surgical History:  Procedure Laterality Date  . ABDOMINAL HYSTERECTOMY  1971   Partial  . ANTERIOR CERVICAL DECOMP/DISCECTOMY FUSION N/A 10/18/2016   Procedure: debridement and drainage cervical prevertebral abscess  ANTERIOR CERVICAL HARDWARE REMOVAL exploration cervical fusion;  Surgeon: Earnie Larsson, MD;  Location: [place];  Service: Neurosurgery;  Laterality: N/A;  . APPENDECTOMY    . BACK SURGERY  2009   Disectomy  . CARDIAC CATHETERIZATION N/A 03/22/2016   Procedure: Left Heart Cath and Coronary Angiography;  Surgeon: Peter M Martinique, MD;  Location: [place] [facility];  Service: Cardiovascular;  Laterality: N/A;  . CHOLECYSTECTOMY  1969   Status post   . CYST REMOVAL TRUNK Right 12/03/2014   Procedure: EXCISION OF RIGHT BACK CYST ;  Surgeon: Coralie Keens, MD;  Location: [place];  Service: General;  Laterality: Right;  . DIRECT LARYNGOSCOPY N/A 10/17/2016   Procedure: DIRECT LARYNGOSCOPY,  ESOPHAGOSCOPY;  Surgeon: [name], MD;  Location: [facility];  Service: ENT;  Laterality: N/A;  . FOOT SURGERY Bilateral    bone removed  . IR GENERIC HISTORICAL  10/25/2016   IR GASTROSTOMY TUBE MOD SED 10/25/2016 [name], MD [facility]  . NEUROPLASTY / TRANSPOSITION MEDIAN NERVE AT CARPAL TUNNEL Bilateral    Hx  Left carpal tunnel repair  . TRACHEOSTOMY TUBE PLACEMENT N/A 10/17/2016   Procedure: TRACHEOSTOMY;  Surgeon: [name], MD;  Location: [facility];  Service: ENT;   Laterality: N/A;   Family History  Problem Relation Age of Onset  . Diabetes type II Mother   . Hypertension Father   . Diabetes type II Sister   . Ovarian cancer Sister   . Diabetes type II Other   . Stroke Neg Hx   . CAD Neg Hx   . Heart failure Neg Hx    Social History  Substance Use Topics  . Smoking status: Former Research scientist (life sciences)  . Smokeless tobacco: Never Used     Comment: 12/02/13- quit over 20 years ago  . Alcohol use No   Current Outpatient Prescriptions  Medication Sig Dispense Refill  . acetaminophen (TYLENOL) 325 MG tablet Take 650 mg by mouth every 6 (six) hours as needed for mild pain.    Marland Kitchen aspirin 81 MG chewable tablet Chew 81 mg by mouth daily.     . bisacodyl (DULCOLAX) 10 MG suppository Place 10 mg rectally once as needed (if no relief from Milk of Magnesia).    . Calcium Carb-Cholecalciferol (CALCIUM 600 + D PO) Take 1 tablet by mouth every morning.     . cholecalciferol (VITAMIN D) 1000 UNITS tablet Take 1,000 Units by mouth daily.     . ferrous sulfate 325 (65 FE) MG EC tablet Take 325 mg by mouth daily.     . furosemide (LASIX) 20 MG tablet Take 3 tablets (60 mg total) by mouth daily. 30 tablet 1  . glimepiride (AMARYL) 1 MG tablet Take 1 mg by mouth daily.    . irbesartan (AVAPRO) 75 MG tablet Take 0.5 tablets (37.5 mg total) by mouth daily. 15 tablet 3  . magnesium hydroxide (MILK OF MAGNESIA) 400 MG/5ML suspension Place 30 mLs into feeding tube once as needed (if no bowel movement in 3 days).     . metoprolol tartrate (LOPRESSOR) 25 MG tablet Place 1 tablet (25 mg total) into feeding tube 2 (two) times daily. (Patient taking differently: Take 25 mg by mouth 2 (two) times daily. )    . Multiple Vitamin (TAB-A-VITE) TABS Take 1 tablet by mouth every morning.     . nortriptyline (PAMELOR) 10 MG capsule Take 10 mg by mouth at bedtime.     Marland Kitchen omeprazole (PRILOSEC) 40 MG capsule 40 mg See admin instructions. Per feeding tube once a day    . OXYGEN Inhale 2 L into the  lungs daily as needed (if O2 stats are <90%).     . Sodium Phosphates (RA SALINE ENEMA) 19-7 GM/118ML ENEM Place 1 enema rectally once as needed (if constipation is not relieved by Dulcolax suppository).    . Water For Irrigation, Sterile (FREE WATER) SOLN Place 100 mLs into feeding tube See admin instructions. 100 cc Q 8 hrs only     No current facility-administered medications for this visit.    Allergies  Allergen Reactions  . Sulfa Antibiotics Swelling    Review of Systems: All systems reviewed and negative except where noted in HPI.  Physical Exam: BP (!) 98/58 (BP Location: Left Arm, Patient Position: Sitting, Cuff Size: Normal)   Pulse 70   Ht 5\' 6"  (1.676 m)   Wt 208 lb (94.3 kg)   BMI 33.57 kg/m  Constitutional:  Obese black female in a wheelchair in no acute distress. Psychiatric: Normal mood and affect. Behavior is normal. EENT: Conjunctivae are normal. No scleral icterus. Neck supple.  Cardiovascular: Normal rate, regular rhythm.  Pulmonary/chest: Effort normal and breath sounds normal. No wheezing, rales or rhonchi. Abdominal: Soft, nontender. Bowel sounds active throughout. Extremities: pitting edema of BLE edema Neurological: Alert and oriented to person place and time. Skin: Skin is warm and dry. No rashes noted.   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 IR placed G-tube, still in place though not using. MBSS in April revealed decreased patency of the cervical esophagus. Mild residual remained near the upper esophageal sphincter with backflow of materials reaching the pyriform sinus. An esophageal sweep revealed barium stasis. Patient was felt to be high risk for materials back flowing into the pyriforms with spillage into the airway. -Patient states she has no dysphagia except with sausage. Will obtain a barium swallow for a more formal  evaluation of the esophagus given barium stasis seem on imaging.    Tye Savoy, NP  04/09/2017, 2:12 PM  Cc:  Hendricks Limes, MD

## 2017-04-10 ENCOUNTER — Non-Acute Institutional Stay (SKILLED_NURSING_FACILITY): Payer: Medicare Other | Admitting: Internal Medicine

## 2017-04-10 ENCOUNTER — Encounter: Payer: Self-pay | Admitting: Internal Medicine

## 2017-04-10 DIAGNOSIS — I5043 Acute on chronic combined systolic (congestive) and diastolic (congestive) heart failure: Secondary | ICD-10-CM | POA: Diagnosis not present

## 2017-04-10 DIAGNOSIS — J387 Other diseases of larynx: Secondary | ICD-10-CM | POA: Diagnosis not present

## 2017-04-10 NOTE — Assessment & Plan Note (Signed)
See cardiology parameters: 3 # weight gain in 24 hours or 5 pound weight gain in 1 week &/or dyspnea/PND/progressive edema

## 2017-04-10 NOTE — Assessment & Plan Note (Addendum)
GI evaluation is underway, follow-up 04/13/17 Remove PEG if no significant residual esophageal pathology of significance (specifically narrowing or stricture)

## 2017-04-10 NOTE — Progress Notes (Signed)
MRN: [id number]  Facility Location: [facility]  Room Number: 086  Code Status: Full Code   PCP: Harlan Stains, MD [place] [id number]  This is a nursing facility follow up for [facility] readmission within 30 days  Interim medical record and care since last [place] visit was updated with review of diagnostic studies and change in clinical status since last visit were documented.  HPI: The patient was hospitalized 5/15-5/18/18 with acute on chronic combined systolic diastolic heart failure. The patient has had recurrent combined systolic and diastolic heart failure for which she was hospitalized in the fall 2017 and again in January of this year. Echo revealed ejection fraction 35-40 percent with severe LVH and possible infiltrative cardiomyopathy of the septum . At the SNF the patient gained greater than 20 pounds, baseline weight was 196 but she had gained up to 224 pounds. BNP was greater than 10,000 but  with diuresis creatinine rose from 1.4-1.7. Despite the increase in diuretics volume overload persisted. Chest x-ray confirmed congestive heart failure. She received IV Lasix 80 mg twice a day with improvement in edema and  weight loss of 12 pounds. After diuresis BNP on 5/17 was 1576. Creatinine stabilized at 2.13.  Significant the patient has a PEG tube which was inserted after cervical spinal surgery, complicated by dysphagia. She's now been ingesting 75-100 percent of the mechanical soft 2 g sodium diet. Tube feedings have been on hold for 90 days. She was seen by GI 5/21 to rule significant esophageal narrowing and follow-up 5/25 is planned. She told them as she told me she has no dysphagia except the sausage which she avoids.  Review of systems: She states that her breathing is significantly improved. She states the biggest change is having less discomfort in her lower legs post  diuresis.  Constitutional: No fever, fatigue  Cardiovascular: No chest pain, palpitations,paroxysmal nocturnal dyspnea, claudication  Respiratory: No cough, sputum production,hemoptysis, DOE , significant snoring,apnea  Gastrointestinal: No heartburn,dysphagia,abdominal pain, nausea / vomiting,rectal bleeding, melena,change in bowels Genitourinary: No dysuria,hematuria, pyuria,  incontinence, nocturia Musculoskeletal: No weakness,pain Dermatologic: No rash, pruritus, change in appearance of skin Neurologic: No dizziness,headache,syncope, seizures, numbness , tingling Psychiatric: No significant anxiety , depression, insomnia, anorexia Endocrine: No change in hair/skin/ nails, excessive thirst, excessive hunger, excessive urination  Hematologic/lymphatic: No significant bruising, lymphadenopathy,abnormal bleeding Allergy/immunology: No itchy/ watery eyes, significant sneezing, urticaria, angioedema  Physical exam:  Pertinent or positive findings: Patient is alert and oriented. She has intermittent medial/ superior deviation of the left eye. Medially there is linear hyperpigmentation of the sclera of the left eye. She has a lower partial. She has minor hirsutism of the chin. Heart sounds are distant. Breath sounds are decreased but the chest is clear to auscultation. Abdomen is protuberant. PEG tube is in place. Soft brace is present over the right lower extremity. She has tense edema of the lower extremities. Pedal pulses are decreased.   General appearance:Adequately nourished; no acute distress , increased work of breathing is present.   Lymphatic: No lymphadenopathy about the head, neck, axilla . Eyes: No conjunctival inflammation or lid edema is present. There is no scleral icterus. Ears:  External ear exam shows no significant lesions or deformities.   Nose:  External nasal examination shows no deformity or inflammation. Nasal mucosa are pink and moist without lesions ,exudates Oral exam:  lips and gums are healthy appearing.There is no oropharyngeal erythema or exudate . Neck:  No  thyromegaly, masses, tenderness noted.    Heart:  No gallop, murmur, click, rub .  Lungs: without wheezes, rhonchi,rales , rubs. Abdomen:Bowel sounds are normal. Abdomen is soft and nontender with no organomegaly, hernias,masses. GU: deferred  Extremities:  No cyanosis, clubbing  Skin: Warm & dry w/o tenting. No significant lesions or rash.  See summary under each active problem in the Problem List with associated updated therapeutic plan

## 2017-04-10 NOTE — Patient Instructions (Signed)
See assessment and plan under each diagnosis in the problem list and acutely for this visit 

## 2017-04-11 NOTE — Progress Notes (Signed)
Reviewed and agree with management plan.  Malcolm T. Stark, MD FACG 

## 2017-04-13 ENCOUNTER — Ambulatory Visit (HOSPITAL_COMMUNITY)
Admission: RE | Admit: 2017-04-13 | Discharge: 2017-04-13 | Disposition: A | Discharge status: Home / Self Care | Payer: Medicare Other | Source: Ambulatory Visit | Attending: Nurse Practitioner | Admitting: Nurse Practitioner

## 2017-04-13 DIAGNOSIS — K229 Disease of esophagus, unspecified: Secondary | ICD-10-CM

## 2017-04-18 NOTE — Progress Notes (Signed)
Cardiology Office Note    Date:  04/19/2017   ID:  Mandy Rhodes, DOB 1940/12/13, MRN [id number]  PCP:  Harlan Stains, MD  Cardiologist: Dr. Debara Pickett  Chief Complaint  Patient presents with  . Hospitalization Follow-up    CHF    History of Present Illness:    Mandy Rhodes is a 76 y.o. female with past medical history of chronic combined systolic and diastolic CHF (EF at 60-73% by echo in 09/2016), nonischemic cardiomyopathy, HTN, HLD, Type 2 DM, Stage 3 CKD and minimal CAD by cath in 03/2016 who presents to the office today for hospital follow-up.   She was recently admitted from 5/15 - 04/06/2017 for an acute CHF exacerbation. BNP was greater than 10,000 and CXR was consistent with CHF. She was diuresed with IV Lasix but as creatinine worsened, she was switched back to PO Lasix. Weight at the time of discharge was 205 lbs and she was continued on  Lasix 60mg  daily. Creatinine was at 2.13 when last checked on 04/06/2017.  In talking with the patient today, she reports overall doing well from a cardiac perspective since her recent hospitalization. She is currently undergoing therapy at [facility] but plans to return home within the next one to 2 weeks. She denies any dyspnea on exertion or chest discomfort when working with physical therapy.  No recent orthopnea, PND, or lower extremity edema. She does weigh regularly and reports weights are usually between 203 - 205 lbs. Reports good urination with her current Lasix dosing however she does not consume much fluid throughout the day, usually < 1000 cc per day.   Past Medical History:  Diagnosis Date  . Anemia   . Arthritis   . CAD (coronary artery disease)    a. minimal CAD by cath in 03/2016  . CHF (congestive heart failure) ([place])    a. 09/2016: Echo with EF of 30-35%, moderate diffuse HK, Grade 2 DD, PA peak pressure of 39 mm Hg.  Marland Kitchen Chronic kidney disease    Renal Insuffiency, see [facility] once a year  . Diabetes  mellitus    type 2  . GERD (gastroesophageal reflux disease)   . History of rotator cuff tear    Torn right rotator cuff.  . History of spinal stenosis   . Hypertension   . Infiltrative cardiomyopathy ([place])    a, presumed cardiac amyloidosis by MRI with prior nondiagnostic fat pad biopsy  . Neuropathy associated with endocrine disorder [facility])     Past Surgical History:  Procedure Laterality Date  . ABDOMINAL HYSTERECTOMY  1971   Partial  . ANTERIOR CERVICAL DECOMP/DISCECTOMY FUSION N/A 10/18/2016   Procedure: debridement and drainage cervical prevertebral abscess  ANTERIOR CERVICAL HARDWARE REMOVAL exploration cervical fusion;  Surgeon: Earnie Larsson, MD;  Location: [facility];  Service: Neurosurgery;  Laterality: N/A;  . APPENDECTOMY    . BACK SURGERY  2009   Disectomy  . CARDIAC CATHETERIZATION N/A 03/22/2016   Procedure: Left Heart Cath and Coronary Angiography;  Surgeon: Peter M Martinique, MD;  Location: [facility];  Service: Cardiovascular;  Laterality: N/A;  . CHOLECYSTECTOMY  1969   Status post   . CYST REMOVAL TRUNK Right 12/03/2014   Procedure: EXCISION OF RIGHT BACK CYST ;  Surgeon: Coralie Keens, MD;  Location: [facility];  Service: General;  Laterality: Right;  . DIRECT LARYNGOSCOPY N/A 10/17/2016   Procedure: DIRECT LARYNGOSCOPY,  ESOPHAGOSCOPY;  Surgeon: [name], MD;  Location: [facility];  Service: ENT;  Laterality: N/A;  . FOOT SURGERY Bilateral    bone removed  . IR GENERIC HISTORICAL  10/25/2016   IR GASTROSTOMY TUBE MOD SED 10/25/2016 [name], MD [facility]  . NEUROPLASTY / TRANSPOSITION MEDIAN NERVE AT CARPAL TUNNEL Bilateral    Hx  Left carpal tunnel repair  . TRACHEOSTOMY TUBE PLACEMENT N/A 10/17/2016   Procedure: TRACHEOSTOMY;  Surgeon: [name], MD;  Location: [facility];  Service: ENT;  Laterality: N/A;    Current Medications: Outpatient Medications Prior to Visit  Medication Sig Dispense Refill  . acetaminophen (TYLENOL) 325 MG tablet Take 650  mg by mouth every 6 (six) hours as needed for mild pain.    Marland Kitchen aspirin 81 MG chewable tablet Chew 81 mg by mouth daily.     . bisacodyl (DULCOLAX) 10 MG suppository Place 10 mg rectally once as needed (if no relief from Milk of Magnesia).    . Calcium Carb-Cholecalciferol (CALCIUM 600 + D PO) Take 1 tablet by mouth every morning.     . cholecalciferol (VITAMIN D) 1000 UNITS tablet Take 1,000 Units by mouth daily.     . ferrous sulfate 325 (65 FE) MG EC tablet Take 325 mg by mouth daily.     Marland Kitchen glimepiride (AMARYL) 1 MG tablet Take 1 mg by mouth daily.    . irbesartan (AVAPRO) 75 MG tablet Take 0.5 tablets (37.5 mg total) by mouth daily. 15 tablet 3  . magnesium hydroxide (MILK OF MAGNESIA) 400 MG/5ML suspension Place 30 mLs into feeding tube once as needed (if no bowel movement in 3 days).     . metoprolol tartrate (LOPRESSOR) 25 MG tablet Place 1 tablet (25 mg total) into feeding tube 2 (two) times daily. (Patient taking differently: Take 25 mg by mouth 2 (two) times daily. )    . Multiple Vitamin (TAB-A-VITE) TABS Take 1 tablet by mouth every morning.     . nortriptyline (PAMELOR) 10 MG capsule Take 10 mg by mouth at bedtime.     Marland Kitchen omeprazole (PRILOSEC) 40 MG capsule 40 mg See admin instructions. Per feeding tube once a day    . OXYGEN Inhale 2 L into the lungs daily as needed (if O2 stats are <90%).     . Sodium Phosphates (RA SALINE ENEMA) 19-7 GM/118ML ENEM Place 1 enema rectally once as needed (if constipation is not relieved by Dulcolax suppository).    . Water For Irrigation, Sterile (FREE WATER) SOLN Place 100 mLs into feeding tube See admin instructions. 100 cc Q 8 hrs only    . furosemide (LASIX) 20 MG tablet Take 3 tablets (60 mg total) by mouth daily. 30 tablet 1   No facility-administered medications prior to visit.      Allergies:   Sulfa antibiotics   Social History   Social History  . Marital status: Divorced    Spouse name: N/A  . Number of children: N/A  . Years of  education: N/A   Occupational History  . retired    Social History Main Topics  . Smoking status: Former Research scientist (life sciences)  . Smokeless tobacco: Never Used     Comment: 12/02/13- quit over 20 years ago  . Alcohol use No  . Drug use: No  . Sexual activity: Not Currently   Other Topics Concern  . None   Social History Narrative  . None     Family History:  The patient's family history includes Diabetes type II in her mother, other, and sister; Hypertension in her father; Ovarian  cancer in her sister.   Review of Systems:   Please see the history of present illness.     General:  No chills, fever, night sweats or weight changes.  Cardiovascular:  No chest pain, edema, orthopnea, palpitations, paroxysmal nocturnal dyspnea. Positive for dyspnea on exertion (improving).  Dermatological: No rash, lesions/masses Respiratory: No cough, dyspnea Urologic: No hematuria, dysuria Abdominal:   No nausea, vomiting, diarrhea, bright red blood per rectum, melena, or hematemesis Neurologic:  No visual changes, wkns, changes in mental status.  All other systems reviewed and are otherwise negative except as noted above.   Physical Exam:    VS:  BP 108/62   Pulse 72   Ht 5\' 6"  (1.676 m)   Wt 203 lb (92.1 kg)   BMI 32.77 kg/m    General: Well developed, well nourished [place] American female appearing in no acute distress. Head: Normocephalic, atraumatic, sclera non-icteric, no xanthomas, nares are without discharge.  Neck: No carotid bruits. JVD not elevated.  Lungs: Respirations regular and unlabored, without wheezes or rales.  Heart: Regular rate and rhythm. No S3 or S4.  No murmur, no rubs, or gallops appreciated. Abdomen: Soft, non-tender, non-distended with normoactive bowel sounds. No hepatomegaly. No rebound/guarding. No obvious abdominal masses. Msk:  Strength and tone appear normal for age. No joint deformities or effusions. Extremities: No clubbing or cyanosis. No lower extremity edema.   Distal pedal pulses are 2+ bilaterally. Neuro: Alert and oriented X 3. Moves all extremities spontaneously. No focal deficits noted. Psych:  Responds to questions appropriately with a normal affect. Skin: No rashes or lesions noted  Wt Readings from Last 3 Encounters:  04/19/17 203 lb (92.1 kg)  04/10/17 208 lb (94.3 kg)  04/09/17 208 lb (94.3 kg)     Studies/Labs Reviewed:   EKG:  EKG is not ordered today.   Recent Labs: 04/03/2017: ALT 33; Hemoglobin 9.7; Magnesium 2.0; Platelets 135; TSH 3.169 04/05/2017: B Natriuretic Peptide 1,576.4 04/19/2017: BUN 39; Creatinine, Ser 1.92; Potassium 4.1; Sodium 142   Lipid Panel    Component Value Date/Time   CHOL 174 02/15/2017   TRIG 48 02/15/2017   HDL 58 02/15/2017   CHOLHDL 3.1 03/18/2016 0626   VLDL 15 03/18/2016 0626   LDLCALC 107 02/15/2017    Additional studies/ records that were reviewed today include:   Echocardiogram: 09/2016 Study Conclusions  - Left ventricle: The cavity size was normal. There was moderate   concentric hypertrophy. The appearance was consistent with   hypertensive changes or infiltrative cardiomyopathy, such as   amyloidosis. Systolic function was moderately to severely   reduced. The estimated ejection fraction was in the range of 30%   to 35%. Moderate diffuse hypokinesis with no identifiable   regional variations. Features are consistent with a pseudonormal   left ventricular filling pattern, with concomitant abnormal   relaxation and increased filling pressure (grade 2 diastolic   dysfunction). - Mitral valve: Valve area by pressure half-time: 2.42 cm^2. - Left atrium: The atrium was moderately dilated. - Pulmonary arteries: Systolic pressure was mildly increased. PA   peak pressure: 39 mm Hg (S).  Assessment:    1. Chronic combined systolic and diastolic heart failure (Mandy Rhodes)   2. Nonischemic cardiomyopathy (Mandy Rhodes)   3. Medication management   4. Essential hypertension   5. CKD (chronic  kidney disease), stage III      Plan:   In order of problems listed above:  1. Chronic combined systolic and diastolic CHF - EF at 58-52% by  echo in 09/2016. Recently admitted for a CHF exacerbation with a discharge weight of 205 lbs.  - Her volume status appears at baseline on physical examination today. - Will reduce Lasix back to her prior to admission dosing of 40 mg daily. She may take an additional 20mg  for weight gain > 3 lbs overnight or > 5 lbs in one week. Recheck BMET today to assess creatinine and K+ levels.  - continue BB and ARB. Would not recommended switching to Entresto at this time with her CKD and borderline BP.   2. Nonischemic cardiomyopathy - known reduced EF of 30-35% with cath in 03/2016 showing minimal CAD. - continue medication regimen as above.   3. HTN - BP stable at 108/62. - continue Irbesartan 37.5mg  daily and Lopressor 25mg  BID.   4. Stage 3 CKD - creatinine was elevated at 2.13 at the time of hospital discharge.  - recheck BMET today.    Medication Adjustments/Labs and Tests Ordered: Current medicines are reviewed at length with the patient today.  Concerns regarding medicines are outlined above.  Medication changes, Labs and Tests ordered today are listed in the Patient Instructions below. Patient Instructions  Medication Instructions:  DECREASE LASIX TO 40MG  DAILY; CONTINUE DAILY WEIGHTS-YOU MAY TAKE ADDITIONAL LASIX 20 MG IF YOUR WEIGHT IS >3 POUNDS DAILY OR >5 POUNDS IN ONE WEEK   If you need a refill on your cardiac medications before your next appointment, please call your pharmacy.  Labwork: CMP TODAY HERE IN OUR OFFICE AT [facility]  Follow-Up: Your physician wants you to follow-up in: 2 MONTHS WITH DR HILTY.     Signed, Erma Heritage, PA-C  04/19/2017 8:38 PM    [facility] [address], [place], [place]  [address] Phone: [phone]; Fax: [phone]  [address], [place]  [place], [place] [address] Phone: [phone]

## 2017-04-19 ENCOUNTER — Ambulatory Visit (INDEPENDENT_AMBULATORY_CARE_PROVIDER_SITE_OTHER): Payer: Medicare Other | Admitting: Student

## 2017-04-19 ENCOUNTER — Other Ambulatory Visit: Payer: Self-pay | Admitting: *Deleted

## 2017-04-19 ENCOUNTER — Encounter: Payer: Self-pay | Admitting: Student

## 2017-04-19 VITALS — BP 108/62 | HR 72 | Ht 66.0 in | Wt 203.0 lb

## 2017-04-19 DIAGNOSIS — N183 Chronic kidney disease, stage 3 unspecified: Secondary | ICD-10-CM

## 2017-04-19 DIAGNOSIS — I428 Other cardiomyopathies: Secondary | ICD-10-CM

## 2017-04-19 DIAGNOSIS — I10 Essential (primary) hypertension: Secondary | ICD-10-CM

## 2017-04-19 DIAGNOSIS — Z79899 Other long term (current) drug therapy: Secondary | ICD-10-CM

## 2017-04-19 DIAGNOSIS — I5042 Chronic combined systolic (congestive) and diastolic (congestive) heart failure: Secondary | ICD-10-CM | POA: Diagnosis not present

## 2017-04-19 LAB — BASIC METABOLIC PANEL
BUN/Creatinine Ratio: 20 (ref 12–28)
BUN: 39 mg/dL — AB (ref 8–27)
CALCIUM: 9.8 mg/dL (ref 8.7–10.3)
CHLORIDE: 101 mmol/L (ref 96–106)
CO2: 24 mmol/L (ref 18–29)
Creatinine, Ser: 1.92 mg/dL — ABNORMAL HIGH (ref 0.57–1.00)
GFR, EST AFRICAN AMERICAN: 29 mL/min/{1.73_m2} — AB (ref >59)
GFR, EST NON AFRICAN AMERICAN: 25 mL/min/{1.73_m2} — AB (ref >59)
Glucose: 79 mg/dL (ref 65–99)
POTASSIUM: 4.1 mmol/L (ref 3.5–5.2)
Sodium: 142 mmol/L (ref 134–144)

## 2017-04-19 MED ORDER — FUROSEMIDE 20 MG PO TABS: 40.0000 mg | ORAL_TABLET | Freq: Every day | ORAL | 1 refills | Status: DC

## 2017-04-19 NOTE — Patient Instructions (Signed)
Medication Instructions:  DECREASE LASIX TO 40MG  DAILY; CONTINUE DAILY WEIGHTS-YOU MAY TAKE ADDITIONAL LASIX 20 MG IF YOUR WEIGHT IS >3 POUNDS DAILY OR >5 POUNDS IN ONE WEEK   If you need a refill on your cardiac medications before your next appointment, please call your pharmacy.  Labwork: CMP TODAY HERE IN OUR OFFICE AT [facility]  Follow-Up: Your physician wants you to follow-up in: 2 MONTHS WITH DR HILTY.    Thank you for choosing [facility] at [facility]!!

## 2017-04-20 ENCOUNTER — Telehealth: Payer: Self-pay | Admitting: Student

## 2017-04-20 NOTE — Telephone Encounter (Signed)
-----   Message from Erma Heritage, [place] sent at 04/20/2017  8:05 AM EDT ----- Please let the patient know her kidney function has improved (creatinine down from 2.13 --> 1.92). Continue with Lasix 40mg  daily as discussed at her office visit. Thank you!

## 2017-04-20 NOTE — Telephone Encounter (Signed)
Pt returned my call and she was made aware of her lab results.

## 2017-04-20 NOTE — Telephone Encounter (Signed)
New message      Pt is returning Jeanann Lewandowsky call for lab results

## 2017-04-20 NOTE — Telephone Encounter (Signed)
New message  ° ° ° °Pt is returning Mandy Rhodes call  °

## 2017-04-27 ENCOUNTER — Other Ambulatory Visit: Payer: Self-pay | Admitting: *Deleted

## 2017-04-27 MED ORDER — FUROSEMIDE 20 MG PO TABS: 60.0000 mg | ORAL_TABLET | Freq: Every day | ORAL | 1 refills | Status: DC

## 2017-04-30 ENCOUNTER — Encounter: Payer: Self-pay | Admitting: *Deleted

## 2017-05-02 ENCOUNTER — Other Ambulatory Visit (HOSPITAL_COMMUNITY): Payer: Self-pay | Admitting: Internal Medicine

## 2017-05-02 DIAGNOSIS — R1310 Dysphagia, unspecified: Secondary | ICD-10-CM

## 2017-05-03 ENCOUNTER — Encounter (HOSPITAL_COMMUNITY): Payer: Self-pay | Admitting: General Surgery

## 2017-05-03 ENCOUNTER — Ambulatory Visit (HOSPITAL_COMMUNITY)
Admission: RE | Admit: 2017-05-03 | Discharge: 2017-05-03 | Disposition: A | Discharge status: Home / Self Care | Payer: Medicare Other | Source: Ambulatory Visit | Attending: Internal Medicine | Admitting: Internal Medicine

## 2017-05-03 DIAGNOSIS — Z431 Encounter for attention to gastrostomy: Secondary | ICD-10-CM | POA: Diagnosis present

## 2017-05-03 DIAGNOSIS — R1310 Dysphagia, unspecified: Secondary | ICD-10-CM

## 2017-05-03 HISTORY — PX: IR GASTROSTOMY TUBE REMOVAL: IMG5492

## 2017-05-03 MED ORDER — LIDOCAINE VISCOUS 2 % MT SOLN
OROMUCOSAL | Status: AC
  Filled 2017-05-03: qty 15

## 2017-05-03 NOTE — Procedures (Signed)
Gastrostomy tube removed in its entirety. No complications EBL: minimal Clean dry dressing placed.  Borghild Thaker E 11:55 AM 05/03/2017

## 2017-05-08 LAB — BASIC METABOLIC PANEL
BUN: 53 — AB (ref 4–21)
CREATININE: 1.9 — AB (ref 0.5–1.1)
Glucose: 95
POTASSIUM: 3.8 (ref 3.4–5.3)
Sodium: 142 (ref 137–147)

## 2017-05-08 LAB — HEPATIC FUNCTION PANEL
ALK PHOS: 114 (ref 25–125)
ALT: 20 (ref 7–35)
AST: 21 (ref 13–35)
Bilirubin, Total: 0.4

## 2017-05-08 LAB — CBC AND DIFFERENTIAL
HEMATOCRIT: 34 — AB (ref 36–46)
HEMOGLOBIN: 10.1 — AB (ref 12.0–16.0)
Neutrophils Absolute: 1
PLATELETS: 106 — AB (ref 150–399)
WBC: 2.9

## 2017-05-08 LAB — TSH: TSH: 3.53 (ref 0.41–5.90)

## 2017-05-15 LAB — VITAMIN B12: VITAMIN B 12: 1068

## 2017-05-15 LAB — CBC AND DIFFERENTIAL
HCT: 36 (ref 36–46)
Hemoglobin: 11.2 — AB (ref 12.0–16.0)
Neutrophils Absolute: 1
PLATELETS: 119 — AB (ref 150–399)
WBC: 2.9

## 2017-05-15 LAB — BASIC METABOLIC PANEL
BUN: 45 — AB (ref 4–21)
CREATININE: 2 — AB (ref 0.5–1.1)
Glucose: 85
POTASSIUM: 3.8 (ref 3.4–5.3)
SODIUM: 142 (ref 137–147)

## 2017-05-15 LAB — HEPATIC FUNCTION PANEL
ALT: 22 (ref 7–35)
AST: 23 (ref 13–35)
Alkaline Phosphatase: 130 — AB (ref 25–125)
Bilirubin, Total: 0.4

## 2017-05-15 LAB — TSH: TSH: 2.83 (ref 0.41–5.90)

## 2017-05-17 ENCOUNTER — Encounter: Payer: Self-pay | Admitting: Internal Medicine

## 2017-05-17 DIAGNOSIS — D72819 Decreased white blood cell count, unspecified: Secondary | ICD-10-CM | POA: Insufficient documentation

## 2017-06-11 ENCOUNTER — Encounter: Payer: Self-pay | Admitting: Internal Medicine

## 2017-06-11 ENCOUNTER — Ambulatory Visit (INDEPENDENT_AMBULATORY_CARE_PROVIDER_SITE_OTHER): Payer: Medicare Other | Admitting: Internal Medicine

## 2017-06-11 VITALS — BP 119/72 | HR 62 | Ht 66.0 in | Wt 206.0 lb

## 2017-06-11 DIAGNOSIS — I10 Essential (primary) hypertension: Secondary | ICD-10-CM | POA: Diagnosis not present

## 2017-06-11 DIAGNOSIS — I5042 Chronic combined systolic (congestive) and diastolic (congestive) heart failure: Secondary | ICD-10-CM | POA: Diagnosis not present

## 2017-06-11 DIAGNOSIS — N183 Chronic kidney disease, stage 3 unspecified: Secondary | ICD-10-CM

## 2017-06-11 DIAGNOSIS — I428 Other cardiomyopathies: Secondary | ICD-10-CM

## 2017-06-11 NOTE — Progress Notes (Signed)
OFFICE NOTE  Chief Complaint:  Follow-up CHF  Primary Care Physician: Harlan Stains, MD  HPI:  Mandy Rhodes is a 76 y.o. female was recently hospitalized for acute combined systolic and diastolic heart failure. Echocardiogram revealed a newly reduced EF of 35-40% with severe LVH was started on IV Lasix and diuresed. Her left heart catheterization revealing a 20% proximal LAD lesion Percent ostial ramus. Echocardiogram was suggestive of infiltrative cardiomyopathy of the septum. May need to consider cardiac MRI in the future. She saw Tenny Craw, PA-C in follow-up in May 2017. She seemed to be doing well at that time however her weight was going up. He increased her Lasix for a few days and then decreased it down to 20 mg QOD. She has been enrolled in home monitoring with [place] [facility] and recently had an increase in her weight over the past week. She was seen in the emergency department on August 22 for back pain and at that time was more short of breath and had some swelling. They increased her Lasix to 40 mg daily and she is currently on that dose. Weight is now down about 5 pounds and her breathing has improved somewhat.  08/21/2016  Mandy Rhodes returns today for follow-up. Her weight is 227, which is increased actually 10 pounds since her last office weight however she denies any worsening swelling. She reports her appetite is increased and this weight gain is related to gating weight back after her recent weight loss. We had referred her for cardiac MRI to evaluate for sarcoidosis however unfortunately her creatinine was markedly elevated over 2.6. Previously it had been 1.6. This was then canceled and we made adjustments on her medications including holding her ARB medication. She was also scheduled to be on Entresto. That was discontinued as well and renal function returned to normal. She was then restarted on low-dose Entresto by our hypertension clinic pharmacist and she will need  reassessment of her renal function.  06/11/2017  Mandy Rhodes was seen today in follow-up for recent hospitalization of acute congestive systolic heart failure. She's been recovering at [facility] rehabilitation. She seems to be doing well and recently saw [place], PA-C, who felt that her weight was slightly below her discharge weight. She decreased her Lasix to 40 mg daily. Mrs. Hjort is been doing well on that dose and watching her diet. She recently had had a PEG tube removed. Weight is been stable. She has twice needed to take additional Lasix. She denies any chest pain or worsening shortness of breath.  PMHx:  Past Medical History:  Diagnosis Date  . Anemia   . Arthritis   . CAD (coronary artery disease)    a. minimal CAD by cath in 03/2016  . CHF (congestive heart failure) ([place])    a. 09/2016: Echo with EF of 30-35%, moderate diffuse HK, Grade 2 DD, PA peak pressure of 39 mm Hg.  Mandy Rhodes Chronic kidney disease    Renal Insuffiency, see [facility] once a year  . Diabetes mellitus    type 2  . GERD (gastroesophageal reflux disease)   . History of rotator cuff tear    Torn right rotator cuff.  . History of spinal stenosis   . Hypertension   . Infiltrative cardiomyopathy ([place])    a, presumed cardiac amyloidosis by MRI with prior nondiagnostic fat pad biopsy  . Neuropathy associated with endocrine disorder [facility])     Past Surgical History:  Procedure Laterality Date  . ABDOMINAL  HYSTERECTOMY  1971   Partial  . ANTERIOR CERVICAL DECOMP/DISCECTOMY FUSION N/A 10/18/2016   Procedure: debridement and drainage cervical prevertebral abscess  ANTERIOR CERVICAL HARDWARE REMOVAL exploration cervical fusion;  Surgeon: Earnie Larsson, MD;  Location: [place];  Service: Neurosurgery;  Laterality: N/A;  . APPENDECTOMY    . BACK SURGERY  2009   Disectomy  . CARDIAC CATHETERIZATION N/A 03/22/2016   Procedure: Left Heart Cath and Coronary Angiography;  Surgeon: Peter M Martinique, MD;  Location: [place] [facility];  Service: Cardiovascular;  Laterality: N/A;  . CHOLECYSTECTOMY  1969   Status post   . CYST REMOVAL TRUNK Right 12/03/2014   Procedure: EXCISION OF RIGHT BACK CYST ;  Surgeon: Coralie Keens, MD;  Location: [place];  Service: General;  Laterality: Right;  . DIRECT LARYNGOSCOPY N/A 10/17/2016   Procedure: DIRECT LARYNGOSCOPY,  ESOPHAGOSCOPY;  Surgeon: [name], MD;  Location: [facility];  Service: ENT;  Laterality: N/A;  . FOOT SURGERY Bilateral    bone removed  . IR GASTROSTOMY TUBE REMOVAL  05/03/2017  . IR GENERIC HISTORICAL  10/25/2016   IR GASTROSTOMY TUBE MOD SED 10/25/2016 Mandy Cadet, MD [facility]  . NEUROPLASTY / TRANSPOSITION MEDIAN NERVE AT CARPAL TUNNEL Bilateral    Hx  Left carpal tunnel repair  . TRACHEOSTOMY TUBE PLACEMENT N/A 10/17/2016   Procedure: TRACHEOSTOMY;  Surgeon: [name], MD;  Location: [facility];  Service: ENT;  Laterality: N/A;    FAMHx:  Family History  Problem Relation Age of Onset  . Diabetes type II Mother   . Hypertension Father   . Diabetes type II Sister   . Ovarian cancer Sister   . Diabetes type II Other   . Stroke Neg Hx   . CAD Neg Hx   . Heart failure Neg Hx     SOCHx:   reports that she has quit smoking. She has never used smokeless tobacco. She reports that she does not drink alcohol or use drugs.  ALLERGIES:  Allergies  Allergen Reactions  . Sulfa Antibiotics Swelling    ROS: Pertinent items noted in HPI and remainder of comprehensive ROS otherwise negative.  HOME MEDS: Current Outpatient Prescriptions  Medication Sig Dispense Refill  . acetaminophen (TYLENOL) 325 MG tablet Take 650 mg by mouth every 6 (six) hours as needed for mild pain.    Mandy Rhodes aspirin 81 MG chewable tablet Chew 81 mg by mouth daily.     . Calcium Carb-Cholecalciferol (CALCIUM 600 + D PO) Take 1 tablet by mouth every morning.     . cholecalciferol (VITAMIN D) 1000 UNITS tablet Take 1,000 Units by mouth daily.     . ferrous sulfate 325  (65 FE) MG EC tablet Take 325 mg by mouth daily.     . furosemide (LASIX) 20 MG tablet Take 3 tablets (60 mg total) by mouth daily. MAY TAKE ADDITIONAL FOR WEIGHT GAIN 270 tablet 1  . glimepiride (AMARYL) 1 MG tablet Take 1 mg by mouth daily.    . metoprolol tartrate (LOPRESSOR) 25 MG tablet Place 1 tablet (25 mg total) into feeding tube 2 (two) times daily. (Patient taking differently: Take 25 mg by mouth 2 (two) times daily. )    . Multiple Vitamin (TAB-A-VITE) TABS Take 1 tablet by mouth every morning.     . nortriptyline (PAMELOR) 10 MG capsule Take 10 mg by mouth at bedtime.     Mandy Rhodes omeprazole (PRILOSEC) 40 MG capsule 40 mg See admin instructions. Per feeding tube once a  day    . OXYGEN Inhale 2 L into the lungs daily as needed (if O2 stats are <90%).      No current facility-administered medications for this visit.     LABS/IMAGING: No results found for this or any previous visit (from the past 48 hour(s)). No results found.  WEIGHTS: Wt Readings from Last 3 Encounters:  06/11/17 206 lb (93.4 kg)  04/19/17 203 lb (92.1 kg)  04/10/17 208 lb (94.3 kg)    VITALS: BP 119/72   Pulse 62   Ht 5\' 6"  (1.676 m)   Wt 206 lb (93.4 kg)   BMI 33.25 kg/m   EXAM: General appearance: alert, no distress and moderately obese Neck: no carotid bruit and no JVD Lungs: clear to auscultation bilaterally Heart: regular rate and rhythm Abdomen: soft, non-tender; bowel sounds normal; no masses,  no organomegaly Extremities: edema Trace sock line edema Pulses: 2+ and symmetric Skin: Skin color, texture, turgor normal. No rashes or lesions Neurologic: Grossly normal Psych: Pleasant  EKG: Deferred  ASSESSMENT: 1. Chronic systolic congestive heart failure, LVEF 35-40% 2. Nonischemic cardiomyopathy-possible infiltrative cardiomyopathy based on echo features 3. Hypertension 4. Dyslipidemia 5. Type 2 diabetes 6. CKD2-3  PLAN: 1.   Mrs. Napoleon reduced down to a dry weight around 205 pounds.  She seems to be maintaining this on her current dose of Lasix. She denies and worsening shortness of breath or chest pain. I believe she stable on her current medications. Will plan follow-up in 6 months or sooner as necessary. She anticipates being discharged from [facility] rehabilitation by the end of this month.  Pixie Casino, MD, [facility] Attending Cardiologist [place] [facility] [place] Yin 06/11/2017, 9:21 AM

## 2017-06-11 NOTE — Patient Instructions (Signed)
Your physician wants you to follow-up in: 6 months or sooner if needed. You will receive a reminder letter in the mail two months in advance. If you don't receive a letter, please call our office to schedule the follow-up appointment.   If you need a refill on your cardiac medications before your next appointment, please call your pharmacy. 

## 2017-06-14 ENCOUNTER — Encounter: Payer: Self-pay | Admitting: Internal Medicine

## 2017-06-14 NOTE — Progress Notes (Signed)
    NURSING HOME LOCATION:  [facility] ROOM NUMBER:  [id number]  CODE STATUS:  Full Code  PCP:  Hendricks Limes, MD  [place] [id number]  This is a nursing facility follow up for specific acute issue of   of chronic medical diagnoses  [place] readmission within 30 days  Interim medical record and care since last [place] visit was updated with review of diagnostic studies and change in clinical status since last visit were documented.  HPI:  Review of systems: Dementia invalidated responses. Date given as   Constitutional: No fever,significant weight change, fatigue  Eyes: No redness, discharge, pain, vision change ENT/mouth: No nasal congestion,  purulent discharge, earache,change in hearing ,sore throat  Cardiovascular: No chest pain, palpitations,paroxysmal nocturnal dyspnea, claudication, edema  Respiratory: No cough, sputum production,hemoptysis, DOE , significant snoring,apnea  Gastrointestinal: No heartburn,dysphagia,abdominal pain, nausea / vomiting,rectal bleeding, melena,change in bowels Genitourinary: No dysuria,hematuria, pyuria,  incontinence, nocturia Musculoskeletal: No joint stiffness, joint swelling, weakness,pain Dermatologic: No rash, pruritus, change in appearance of skin Neurologic: No dizziness,headache,syncope, seizures, numbness , tingling Psychiatric: No significant anxiety , depression, insomnia, anorexia Endocrine: No change in hair/skin/ nails, excessive thirst, excessive hunger, excessive urination  Hematologic/lymphatic: No significant bruising, lymphadenopathy,abnormal bleeding Allergy/immunology: No itchy/ watery eyes, significant sneezing, urticaria, angioedema  Physical exam:  Pertinent or positive findings: General appearance:Adequately nourished; no acute distress , increased work of breathing is present.   Lymphatic: No lymphadenopathy about the head, neck, axilla . Eyes: No conjunctival inflammation or  lid edema is present. There is no scleral icterus. Ears:  External ear exam shows no significant lesions or deformities.   Nose:  External nasal examination shows no deformity or inflammation. Nasal mucosa are pink and moist without lesions ,exudates Oral exam: lips and gums are healthy appearing.There is no oropharyngeal erythema or exudate . Neck:  No thyromegaly, masses, tenderness noted.    Heart:  Normal rate and regular rhythm. S1 and S2 normal without gallop, murmur, click, rub .  Lungs:Chest clear to auscultation without wheezes, rhonchi,rales , rubs. Abdomen:Bowel sounds are normal. Abdomen is soft and nontender with no organomegaly, hernias,masses. GU: deferred  Extremities:  No cyanosis, clubbing,edema  Neurologic exam : Cn 2-7 intact Strength equal  in upper & lower extremities Balance,Rhomberg,finger to nose testing could not be completed due to clinical state Deep tendon reflexes are equal Skin: Warm & dry w/o tenting. No significant lesions or rash.  See summary under each active problem in the Problem List with associated updated therapeutic plan     This encounter was created in error - please disregard.

## 2017-06-21 ENCOUNTER — Encounter: Payer: Self-pay | Admitting: Internal Medicine

## 2017-06-21 ENCOUNTER — Non-Acute Institutional Stay (SKILLED_NURSING_FACILITY): Payer: Medicare Other | Admitting: Internal Medicine

## 2017-06-21 DIAGNOSIS — N183 Chronic kidney disease, stage 3 unspecified: Secondary | ICD-10-CM

## 2017-06-21 DIAGNOSIS — I5042 Chronic combined systolic (congestive) and diastolic (congestive) heart failure: Secondary | ICD-10-CM | POA: Diagnosis not present

## 2017-06-21 DIAGNOSIS — I10 Essential (primary) hypertension: Secondary | ICD-10-CM | POA: Diagnosis not present

## 2017-06-21 DIAGNOSIS — E1122 Type 2 diabetes mellitus with diabetic chronic kidney disease: Secondary | ICD-10-CM

## 2017-06-21 NOTE — Assessment & Plan Note (Signed)
   creatinine is stable at approximately 2.0; this will be rechecked with the A1c in late August

## 2017-06-21 NOTE — Assessment & Plan Note (Signed)
04/03/17 A1c 6.4% , this will be repeated in late August to verify stability.

## 2017-06-21 NOTE — Assessment & Plan Note (Addendum)
Weight goal 205 pounds  The patient is at her weight goal at this time and no change in her diuretic would be made  she was instructed to continue low-sodium diet  She is to follow-up with her cardiologist , Dr. Debara Pickett in early 2019  As needed Lasix for 3 pound weight gain

## 2017-06-21 NOTE — Progress Notes (Signed)
NURSING HOME LOCATION:  [facility] ROOM NUMBER:  [id number]  CODE STATUS:  Full Code  PCP: Sheliah Hatch  This is a nursing facility follow up of chronic medical diagnoses  Interim medical record and care since last [place] visit was updated with review of diagnostic studies and change in clinical status since last visit were documented.  HPI: the patient is on  Amaryl 1 mg daily for diabetes. Her glucose diary was reviewed; glucoses range from a low of 96 up to a high 290. The 290 is an outlier , any glucoses over 200 been typically in the 210 range. Her last A1c was 5/15 and was borderline diabetes at 6.4%. Repeat A1c would be due in mid August.  She ha renal insufficiency with a stable creatinine of approximately 2. GFR is stable in the mid to high 20s. The most recent value was 29. She has heart failure with a persistently elevated BNP. The most recent value was 1576. She saw her cardiologist most recently 7/23. It was documented tht her weight was slightly below that at discharge after hospitalization for heart failure exacerbation. She is remaining stable on 40 mg of Lasix daily. She had taken pulse doses of Lasix for weight gain of 3 poundson 2 occasions.  Review of systems: She feels her edema is stable. She does have urinary frequency with Lasix. She denies any active cardiopulmonary or endocrinologic symptoms at this time. She does keep up with her glucoses and weights. She states she plans to leave the facility late this month.  Constitutional: No fever,significant weight change, fatigue  Eyes: No redness, discharge, pain, vision change ENT/mouth: No nasal congestion,  purulent discharge, earache,change in hearing ,sore throat  Cardiovascular: No chest pain, palpitations,paroxysmal nocturnal dyspnea, claudication Respiratory: No cough, sputum production,hemoptysis, DOE , significant snoring,apnea  Gastrointestinal: No heartburn,dysphagia,abdominal pain, nausea /  vomiting,rectal bleeding, melena,change in bowels Genitourinary: No dysuria,hematuria, pyuria,  incontinence, nocturia Musculoskeletal: No joint stiffness, joint swelling, weakness,pain Dermatologic: No rash, pruritus, change in appearance of skin Neurologic: No dizziness,headache,syncope, seizures, numbness , tingling Psychiatric: No significant anxiety , depression, insomnia, anorexia Endocrine: No change in hair/skin/ nails, excessive thirst, excessive hunger, excessive urination  Hematologic/lymphatic: No significant bruising, lymphadenopathy,abnormal bleeding Allergy/immunology: No itchy/ watery eyes, significant sneezing, urticaria, angioedema  Physical exam:  Pertinent or positive findings: She has intermittent esotropia of the right eye. She has an upper dental plate. Breath sounds are decreased. The second heart sound is increased. She has 1/2+ pedal edema. Pedal pulses are decreased.  General appearance:Adequately nourished; no acute distress , increased work of breathing is present.   Lymphatic: No lymphadenopathy about the head, neck, axilla . Eyes: No conjunctival inflammation or lid edema is present. There is no scleral icterus. Ears:  External ear exam shows no significant lesions or deformities.   Nose:  External nasal examination shows no deformity or inflammation. Nasal mucosa are pink and moist without lesions ,exudates Oral exam: lips and gums are healthy appearing.There is no oropharyngeal erythema or exudate . Neck:  No thyromegaly, masses, tenderness noted.    Heart:  Normal rate and regular rhythm. S1 normal without gallop, murmur, click, rub .  Lungs: without wheezes, rhonchi,rales , rubs. Abdomen:Bowel sounds are normal. Abdomen is soft and nontender with no organomegaly, hernias,masses. GU: deferred  Extremities:  No cyanosis, clubbing  Neurologic exam : Strength equal  in upper & lower extremities Skin: Warm & dry w/o tenting. No significant lesions or  rash.  See summary under each  active problem in the Problem List with associated updated therapeutic plan

## 2017-06-21 NOTE — Patient Instructions (Signed)
See assessment and plan under each diagnosis in the problem list and acutely for this visit 

## 2017-06-21 NOTE — Assessment & Plan Note (Deleted)
The patient is at her weight goal at this time and no change in her diuretic would be made  she was instructed to continue low-sodium diet  She is to follow-up with her cardiologist , Dr. Debara Pickett in early 2019

## 2017-06-21 NOTE — Assessment & Plan Note (Signed)
BP controlled; no change in antihypertensive medications  

## 2017-07-17 ENCOUNTER — Non-Acute Institutional Stay (SKILLED_NURSING_FACILITY): Payer: Medicare Other

## 2017-07-17 DIAGNOSIS — Z0000 Encounter for general adult medical examination without abnormal findings: Secondary | ICD-10-CM | POA: Diagnosis not present

## 2017-07-17 NOTE — Patient Instructions (Signed)
Mandy Rhodes , Thank you for taking time to come for your Medicare Wellness Visit. I appreciate your ongoing commitment to your health goals. Please review the following plan we discussed and let me know if I can assist you in the future.   Screening recommendations/referrals: Colonoscopy excluded, long term pt Mammogram excluded, long term pt Bone Density up to date Recommended yearly ophthalmology/optometry visit for glaucoma screening and checkup Recommended yearly dental visit for hygiene and checkup  Vaccinations: Influenza vaccine due 07/21/2017 Pneumococcal vaccine up to date Tdap vaccine up to date. Due 08/01/20 Shingles vaccine not in records  Advanced directives: Needed for chart  Conditions/risks identified: None  Next appointment: Dr. Linna Darner makes rounds   Preventive Care 58 Years and Older, Female Preventive care refers to lifestyle choices and visits with your health care provider that can promote health and wellness. What does preventive care include?  A yearly physical exam. This is also called an annual well check.  Dental exams once or twice a year.  Routine eye exams. Ask your health care provider how often you should have your eyes checked.  Personal lifestyle choices, including:  Daily care of your teeth and gums.  Regular physical activity.  Eating a healthy diet.  Avoiding tobacco and drug use.  Limiting alcohol use.  Practicing safe sex.  Taking low-dose aspirin every day.  Taking vitamin and mineral supplements as recommended by your health care provider. What happens during an annual well check? The services and screenings done by your health care provider during your annual well check will depend on your age, overall health, lifestyle risk factors, and family history of disease. Counseling  Your health care provider may ask you questions about your:  Alcohol use.  Tobacco use.  Drug use.  Emotional well-being.  Home and relationship  well-being.  Sexual activity.  Eating habits.  History of falls.  Memory and ability to understand (cognition).  Work and work Statistician.  Reproductive health. Screening  You may have the following tests or measurements:  Height, weight, and BMI.  Blood pressure.  Lipid and cholesterol levels. These may be checked every 5 years, or more frequently if you are over 50 years old.  Skin check.  Lung cancer screening. You may have this screening every year starting at age 72 if you have a 30-pack-year history of smoking and currently smoke or have quit within the past 15 years.  Fecal occult blood test (FOBT) of the stool. You may have this test every year starting at age 47.  Flexible sigmoidoscopy or colonoscopy. You may have a sigmoidoscopy every 5 years or a colonoscopy every 10 years starting at age 68.  Hepatitis C blood test.  Hepatitis B blood test.  Sexually transmitted disease (STD) testing.  Diabetes screening. This is done by checking your blood sugar (glucose) after you have not eaten for a while (fasting). You may have this done every 1-3 years.  Bone density scan. This is done to screen for osteoporosis. You may have this done starting at age 47.  Mammogram. This may be done every 1-2 years. Talk to your health care provider about how often you should have regular mammograms. Talk with your health care provider about your test results, treatment options, and if necessary, the need for more tests. Vaccines  Your health care provider may recommend certain vaccines, such as:  Influenza vaccine. This is recommended every year.  Tetanus, diphtheria, and acellular pertussis (Tdap, Td) vaccine. You may need a Td booster  every 10 years.  Zoster vaccine. You may need this after age 22.  Pneumococcal 13-valent conjugate (PCV13) vaccine. One dose is recommended after age 14.  Pneumococcal polysaccharide (PPSV23) vaccine. One dose is recommended after age  75. Talk to your health care provider about which screenings and vaccines you need and how often you need them. This information is not intended to replace advice given to you by your health care provider. Make sure you discuss any questions you have with your health care provider. Document Released: 12/03/2015 Document Revised: 07/26/2016 Document Reviewed: 09/07/2015 Elsevier Interactive Patient Education  2017 [place] Prevention in the Home Falls can cause injuries. They can happen to people of all ages. There are many things you can do to make your home safe and to help prevent falls. What can I do on the outside of my home?  Regularly fix the edges of walkways and driveways and fix any cracks.  Remove anything that might make you trip as you walk through a door, such as a raised step or threshold.  Trim any bushes or trees on the path to your home.  Use bright outdoor lighting.  Clear any walking paths of anything that might make someone trip, such as rocks or tools.  Regularly check to see if handrails are loose or broken. Make sure that both sides of any steps have handrails.  Any raised decks and porches should have guardrails on the edges.  Have any leaves, snow, or ice cleared regularly.  Use sand or salt on walking paths during winter.  Clean up any spills in your garage right away. This includes oil or grease spills. What can I do in the bathroom?  Use night lights.  Install grab bars by the toilet and in the tub and shower. Do not use towel bars as grab bars.  Use non-skid mats or decals in the tub or shower.  If you need to sit down in the shower, use a plastic, non-slip stool.  Keep the floor dry. Clean up any water that spills on the floor as soon as it happens.  Remove soap buildup in the tub or shower regularly.  Attach bath mats securely with double-sided non-slip rug tape.  Do not have throw rugs and other things on the floor that can make  you trip. What can I do in the bedroom?  Use night lights.  Make sure that you have a light by your bed that is easy to reach.  Do not use any sheets or blankets that are too big for your bed. They should not hang down onto the floor.  Have a firm chair that has side arms. You can use this for support while you get dressed.  Do not have throw rugs and other things on the floor that can make you trip. What can I do in the kitchen?  Clean up any spills right away.  Avoid walking on wet floors.  Keep items that you use a lot in easy-to-reach places.  If you need to reach something above you, use a strong step stool that has a grab bar.  Keep electrical cords out of the way.  Do not use floor polish or wax that makes floors slippery. If you must use wax, use non-skid floor wax.  Do not have throw rugs and other things on the floor that can make you trip. What can I do with my stairs?  Do not leave any items on the stairs.  Make  sure that there are handrails on both sides of the stairs and use them. Fix handrails that are broken or loose. Make sure that handrails are as long as the stairways.  Check any carpeting to make sure that it is firmly attached to the stairs. Fix any carpet that is loose or worn.  Avoid having throw rugs at the top or bottom of the stairs. If you do have throw rugs, attach them to the floor with carpet tape.  Make sure that you have a light switch at the top of the stairs and the bottom of the stairs. If you do not have them, ask someone to add them for you. What else can I do to help prevent falls?  Wear shoes that:  Do not have high heels.  Have rubber bottoms.  Are comfortable and fit you well.  Are closed at the toe. Do not wear sandals.  If you use a stepladder:  Make sure that it is fully opened. Do not climb a closed stepladder.  Make sure that both sides of the stepladder are locked into place.  Ask someone to hold it for you, if  possible.  Clearly mark and make sure that you can see:  Any grab bars or handrails.  First and last steps.  Where the edge of each step is.  Use tools that help you move around (mobility aids) if they are needed. These include:  Canes.  Walkers.  Scooters.  Crutches.  Turn on the lights when you go into a dark area. Replace any light bulbs as soon as they burn out.  Set up your furniture so you have a clear path. Avoid moving your furniture around.  If any of your floors are uneven, fix them.  If there are any pets around you, be aware of where they are.  Review your medicines with your doctor. Some medicines can make you feel dizzy. This can increase your chance of falling. Ask your doctor what other things that you can do to help prevent falls. This information is not intended to replace advice given to you by your health care provider. Make sure you discuss any questions you have with your health care provider. Document Released: 09/02/2009 Document Revised: 04/13/2016 Document Reviewed: 12/11/2014 Elsevier Interactive Patient Education  2017 [employer].

## 2017-07-17 NOTE — Progress Notes (Signed)
Subjective:   Mandy Rhodes is a 76 y.o. female who presents for an Initial Medicare Annual Wellness Visit at [facility]        Objective:    Today's Vitals   07/17/17 1133  BP: 140/72  Pulse: 60  Temp: 97.6 F (36.4 C)  TempSrc: Oral  SpO2: 97%  Weight: 205 lb (93 kg)  Height: 5\' 6"  (1.676 m)   Body mass index is 33.09 kg/m.   Current Medications (verified) Outpatient Encounter Prescriptions as of 07/17/2017  Medication Sig  . aspirin 81 MG chewable tablet Chew 81 mg by mouth daily.   . Calcium Carb-Cholecalciferol (CALCIUM 600 + D PO) Take 1 tablet by mouth every morning.   . ferrous sulfate 325 (65 FE) MG EC tablet Take 325 mg by mouth daily.   . furosemide (LASIX) 20 MG tablet Take 3 tablets (60 mg total) by mouth daily. MAY TAKE ADDITIONAL FOR WEIGHT GAIN (Patient taking differently: Take 20 mg by mouth daily. As needed for weight gain of 3lbs overnight or 5lbs in 1 week)  . furosemide (LASIX) 40 MG tablet Take 40 mg by mouth daily.  Marland Kitchen glimepiride (AMARYL) 1 MG tablet Take 1 mg by mouth daily.  . irbesartan (AVAPRO) 75 MG tablet Take 1/2 tablets (37.5) by mouth once daily  . metoprolol tartrate (LOPRESSOR) 25 MG tablet Place 1 tablet (25 mg total) into feeding tube 2 (two) times daily. (Patient taking differently: Take 25 mg by mouth 2 (two) times daily. )  . Multiple Vitamin (TAB-A-VITE) TABS Take 1 tablet by mouth every morning.   . nortriptyline (PAMELOR) 10 MG capsule Take 10 mg by mouth at bedtime.   Marland Kitchen omeprazole (PRILOSEC) 40 MG capsule 40 mg See admin instructions. Per feeding tube once a day  . OXYGEN Inhale 2 L into the lungs daily as needed (if O2 stats are <90%).   . Vitamin D, Ergocalciferol, (DRISDOL) 50000 units CAPS capsule Take 50,000 Units by mouth every 30 (thirty) days.  Marland Kitchen acetaminophen (TYLENOL) 325 MG tablet Take 650 mg by mouth every 6 (six) hours as needed for mild pain.   No facility-administered encounter medications on file as  of 07/17/2017.     Allergies (verified) Sulfa antibiotics   History: Past Medical History:  Diagnosis Date  . Anemia   . Arthritis   . CAD (coronary artery disease)    a. minimal CAD by cath in 03/2016  . CHF (congestive heart failure) ([place])    a. 09/2016: Echo with EF of 30-35%, moderate diffuse HK, Grade 2 DD, PA peak pressure of 39 mm Hg.  Marland Kitchen Chronic kidney disease    Renal Insuffiency, see [facility] once a year  . Diabetes mellitus    type 2  . GERD (gastroesophageal reflux disease)   . History of rotator cuff tear    Torn right rotator cuff.  . History of spinal stenosis   . Hypertension   . Infiltrative cardiomyopathy ([place])    a, presumed cardiac amyloidosis by MRI with prior nondiagnostic fat pad biopsy  . Neuropathy associated with endocrine disorder [facility])    Past Surgical History:  Procedure Laterality Date  . ABDOMINAL HYSTERECTOMY  1971   Partial  . ANTERIOR CERVICAL DECOMP/DISCECTOMY FUSION N/A 10/18/2016   Procedure: debridement and drainage cervical prevertebral abscess  ANTERIOR CERVICAL HARDWARE REMOVAL exploration cervical fusion;  Surgeon: Earnie Larsson, MD;  Location: [facility];  Service: Neurosurgery;  Laterality: N/A;  . APPENDECTOMY    .  BACK SURGERY  2009   Disectomy  . CARDIAC CATHETERIZATION N/A 03/22/2016   Procedure: Left Heart Cath and Coronary Angiography;  Surgeon: Peter M Martinique, MD;  Location: [facility];  Service: Cardiovascular;  Laterality: N/A;  . CHOLECYSTECTOMY  1969   Status post   . CYST REMOVAL TRUNK Right 12/03/2014   Procedure: EXCISION OF RIGHT BACK CYST ;  Surgeon: Coralie Keens, MD;  Location: [facility];  Service: General;  Laterality: Right;  . DIRECT LARYNGOSCOPY N/A 10/17/2016   Procedure: DIRECT LARYNGOSCOPY,  ESOPHAGOSCOPY;  Surgeon: [name], MD;  Location: [facility];  Service: ENT;  Laterality: N/A;  . FOOT SURGERY Bilateral    bone removed  . IR GASTROSTOMY TUBE REMOVAL  05/03/2017  . IR GENERIC HISTORICAL   10/25/2016   IR GASTROSTOMY TUBE MOD SED 10/25/2016 [name], MD [facility]-INTERV RAD  . NEUROPLASTY / TRANSPOSITION MEDIAN NERVE AT CARPAL TUNNEL Bilateral    Hx  Left carpal tunnel repair  . TRACHEOSTOMY TUBE PLACEMENT N/A 10/17/2016   Procedure: TRACHEOSTOMY;  Surgeon: [name], MD;  Location: [facility];  Service: ENT;  Laterality: N/A;   Family History  Problem Relation Age of Onset  . Diabetes type II Mother   . Hypertension Father   . Diabetes type II Sister   . Ovarian cancer Sister   . Diabetes type II Other   . Stroke Neg Hx   . CAD Neg Hx   . Heart failure Neg Hx    Social History   Occupational History  . retired    Social History Main Topics  . Smoking status: Former Research scientist (life sciences)  . Smokeless tobacco: Never Used     Comment: 12/02/13- quit over 20 years ago  . Alcohol use No  . Drug use: No  . Sexual activity: Not Currently    Tobacco Counseling Counseling given: Not Answered   Activities of Daily Living In your present state of health, do you have any difficulty performing the following activities: 07/17/2017 04/03/2017  Hearing? N N  Vision? N N  Difficulty concentrating or making decisions? N N  Walking or climbing stairs? Y Y  Dressing or bathing? Y N  Doing errands, shopping? [place] and eating ? Y -  Using the Toilet? Y -  In the past six months, have you accidently leaked urine? N -  Do you have problems with loss of bowel control? N -  Managing your Medications? Y -  Managing your Finances? Y -  Housekeeping or managing your Housekeeping? Y -  Some recent data might be hidden    Immunizations and Health Maintenance Immunization History  Administered Date(s) Administered  . Influenza-Unspecified 07/21/2014   Health Maintenance Due  Topic Date Due  . OPHTHALMOLOGY EXAM  08/26/1951  . DEXA SCAN  08/25/2006  . PNA vac Low Risk Adult (1 of 2 - PCV13) 08/25/2006  . INFLUENZA VACCINE  06/20/2017    Patient Care  Team: Hendricks Limes, MD as PCP - General (Internal Medicine)  Indicate any recent Medical Services you may have received from other than [facility] providers in the past year (date may be approximate).     Assessment:   This is a routine wellness examination for Iva.   Hearing/Vision screen No exam data present  Dietary issues and exercise activities discussed: Current Exercise Habits: The patient does not participate in regular exercise at present, Exercise limited by: orthopedic condition(s)  Goals    None  Depression Screen PHQ 2/9 Scores 07/17/2017 11/30/2016 10/04/2016 09/06/2016 08/08/2016 06/30/2016 06/01/2016  PHQ - 2 Score 0 1 0 0 0 0 0    Fall Risk Fall Risk  07/17/2017 11/30/2016 10/04/2016 09/06/2016 08/08/2016  Falls in the past year? Yes Yes No No No  Number falls in past yr: 2 or more 1 - - -  Injury with Fall? Yes Yes - - -  Risk Factor Category  - High Fall Risk - - -  Risk for fall due to : - Impaired balance/gait;Impaired mobility;History of fall(s) - - -  Follow up - Education provided;Falls prevention discussed - - -    Cognitive Function:     6CIT Screen 07/17/2017  What Year? 0 points  What month? 0 points  What time? 0 points  Count back from 20 0 points  Months in reverse 0 points  Repeat phrase 0 points  Total Score 0    Screening Tests Health Maintenance  Topic Date Due  . OPHTHALMOLOGY EXAM  08/26/1951  . DEXA SCAN  08/25/2006  . PNA vac Low Risk Adult (1 of 2 - PCV13) 08/25/2006  . INFLUENZA VACCINE  06/20/2017  . TETANUS/TDAP  02/18/2019 (Originally 08/25/1960)  . HEMOGLOBIN A1C  10/04/2017  . FOOT EXAM  01/05/2018  . COLONOSCOPY  Excluded      Plan:    I have personally reviewed and addressed the Medicare Annual Wellness questionnaire and have noted the following in the patient's chart:  A. Medical and social history B. Use of alcohol, tobacco or illicit drugs  C. Current medications and supplements D. Functional ability and  status E.  Nutritional status F.  Physical activity G. Advance directives H. List of other physicians I.  Hospitalizations, surgeries, and ER visits in previous 12 months J.  [place] to include hearing, vision, cognitive, depression L. Referrals and appointments - none  In addition, I have reviewed and discussed with patient certain preventive protocols, quality metrics, and best practice recommendations. A written personalized care plan for preventive services as well as general preventive health recommendations were provided to patient.  See attached scanned questionnaire for additional information.   Signed,   Rich Reining, RN Nurse Health Advisor   Quick Notes   Health Maintenance: Eye exam due     Abnormal Screen: None     Patient Concerns: none     Nurse Concerns: none I have personally reviewed the health advisor's clinical note, was available for consultation, and agree with the assessment and plan as written. Hendricks Limes M.D., FACP, [facility]

## 2017-07-25 DIAGNOSIS — J449 Chronic obstructive pulmonary disease, unspecified: Secondary | ICD-10-CM | POA: Diagnosis not present

## 2017-07-25 DIAGNOSIS — M6281 Muscle weakness (generalized): Secondary | ICD-10-CM | POA: Diagnosis not present

## 2017-07-25 DIAGNOSIS — I5043 Acute on chronic combined systolic (congestive) and diastolic (congestive) heart failure: Secondary | ICD-10-CM | POA: Diagnosis not present

## 2017-07-25 DIAGNOSIS — M1990 Unspecified osteoarthritis, unspecified site: Secondary | ICD-10-CM | POA: Diagnosis not present

## 2017-07-26 DIAGNOSIS — E1142 Type 2 diabetes mellitus with diabetic polyneuropathy: Secondary | ICD-10-CM | POA: Diagnosis not present

## 2017-07-26 DIAGNOSIS — I129 Hypertensive chronic kidney disease with stage 1 through stage 4 chronic kidney disease, or unspecified chronic kidney disease: Secondary | ICD-10-CM | POA: Diagnosis not present

## 2017-07-26 DIAGNOSIS — M6281 Muscle weakness (generalized): Secondary | ICD-10-CM | POA: Diagnosis not present

## 2017-07-26 DIAGNOSIS — N183 Chronic kidney disease, stage 3 (moderate): Secondary | ICD-10-CM | POA: Diagnosis not present

## 2017-07-26 DIAGNOSIS — D509 Iron deficiency anemia, unspecified: Secondary | ICD-10-CM | POA: Diagnosis not present

## 2017-07-26 DIAGNOSIS — E785 Hyperlipidemia, unspecified: Secondary | ICD-10-CM | POA: Diagnosis not present

## 2017-07-26 DIAGNOSIS — K219 Gastro-esophageal reflux disease without esophagitis: Secondary | ICD-10-CM | POA: Diagnosis not present

## 2017-07-26 DIAGNOSIS — J449 Chronic obstructive pulmonary disease, unspecified: Secondary | ICD-10-CM | POA: Diagnosis not present

## 2017-07-26 DIAGNOSIS — E1121 Type 2 diabetes mellitus with diabetic nephropathy: Secondary | ICD-10-CM | POA: Diagnosis not present

## 2017-07-26 DIAGNOSIS — Z7984 Long term (current) use of oral hypoglycemic drugs: Secondary | ICD-10-CM | POA: Diagnosis not present

## 2017-07-26 DIAGNOSIS — I5043 Acute on chronic combined systolic (congestive) and diastolic (congestive) heart failure: Secondary | ICD-10-CM | POA: Diagnosis not present

## 2017-07-26 DIAGNOSIS — I5042 Chronic combined systolic (congestive) and diastolic (congestive) heart failure: Secondary | ICD-10-CM | POA: Diagnosis not present

## 2017-07-26 DIAGNOSIS — M1990 Unspecified osteoarthritis, unspecified site: Secondary | ICD-10-CM | POA: Diagnosis not present

## 2017-07-27 DIAGNOSIS — M6281 Muscle weakness (generalized): Secondary | ICD-10-CM | POA: Diagnosis not present

## 2017-07-27 DIAGNOSIS — E785 Hyperlipidemia, unspecified: Secondary | ICD-10-CM | POA: Diagnosis not present

## 2017-07-27 DIAGNOSIS — E1121 Type 2 diabetes mellitus with diabetic nephropathy: Secondary | ICD-10-CM | POA: Diagnosis not present

## 2017-07-27 DIAGNOSIS — M1990 Unspecified osteoarthritis, unspecified site: Secondary | ICD-10-CM | POA: Diagnosis not present

## 2017-07-27 DIAGNOSIS — Z7984 Long term (current) use of oral hypoglycemic drugs: Secondary | ICD-10-CM | POA: Diagnosis not present

## 2017-07-27 DIAGNOSIS — J449 Chronic obstructive pulmonary disease, unspecified: Secondary | ICD-10-CM | POA: Diagnosis not present

## 2017-07-27 DIAGNOSIS — N183 Chronic kidney disease, stage 3 (moderate): Secondary | ICD-10-CM | POA: Diagnosis not present

## 2017-07-27 DIAGNOSIS — D509 Iron deficiency anemia, unspecified: Secondary | ICD-10-CM | POA: Diagnosis not present

## 2017-07-27 DIAGNOSIS — I5043 Acute on chronic combined systolic (congestive) and diastolic (congestive) heart failure: Secondary | ICD-10-CM | POA: Diagnosis not present

## 2017-07-30 DIAGNOSIS — M6281 Muscle weakness (generalized): Secondary | ICD-10-CM | POA: Diagnosis not present

## 2017-07-30 DIAGNOSIS — M1990 Unspecified osteoarthritis, unspecified site: Secondary | ICD-10-CM | POA: Diagnosis not present

## 2017-07-30 DIAGNOSIS — J449 Chronic obstructive pulmonary disease, unspecified: Secondary | ICD-10-CM | POA: Diagnosis not present

## 2017-07-30 DIAGNOSIS — I5043 Acute on chronic combined systolic (congestive) and diastolic (congestive) heart failure: Secondary | ICD-10-CM | POA: Diagnosis not present

## 2017-07-31 DIAGNOSIS — M6281 Muscle weakness (generalized): Secondary | ICD-10-CM | POA: Diagnosis not present

## 2017-07-31 DIAGNOSIS — M1990 Unspecified osteoarthritis, unspecified site: Secondary | ICD-10-CM | POA: Diagnosis not present

## 2017-07-31 DIAGNOSIS — J449 Chronic obstructive pulmonary disease, unspecified: Secondary | ICD-10-CM | POA: Diagnosis not present

## 2017-07-31 DIAGNOSIS — I5043 Acute on chronic combined systolic (congestive) and diastolic (congestive) heart failure: Secondary | ICD-10-CM | POA: Diagnosis not present

## 2017-08-01 DIAGNOSIS — M1990 Unspecified osteoarthritis, unspecified site: Secondary | ICD-10-CM | POA: Diagnosis not present

## 2017-08-01 DIAGNOSIS — I5043 Acute on chronic combined systolic (congestive) and diastolic (congestive) heart failure: Secondary | ICD-10-CM | POA: Diagnosis not present

## 2017-08-01 DIAGNOSIS — M6281 Muscle weakness (generalized): Secondary | ICD-10-CM | POA: Diagnosis not present

## 2017-08-01 DIAGNOSIS — J449 Chronic obstructive pulmonary disease, unspecified: Secondary | ICD-10-CM | POA: Diagnosis not present

## 2017-08-02 DIAGNOSIS — M1990 Unspecified osteoarthritis, unspecified site: Secondary | ICD-10-CM | POA: Diagnosis not present

## 2017-08-02 DIAGNOSIS — I5043 Acute on chronic combined systolic (congestive) and diastolic (congestive) heart failure: Secondary | ICD-10-CM | POA: Diagnosis not present

## 2017-08-02 DIAGNOSIS — M6281 Muscle weakness (generalized): Secondary | ICD-10-CM | POA: Diagnosis not present

## 2017-08-02 DIAGNOSIS — J449 Chronic obstructive pulmonary disease, unspecified: Secondary | ICD-10-CM | POA: Diagnosis not present

## 2017-08-06 DIAGNOSIS — M6281 Muscle weakness (generalized): Secondary | ICD-10-CM | POA: Diagnosis not present

## 2017-08-06 DIAGNOSIS — J449 Chronic obstructive pulmonary disease, unspecified: Secondary | ICD-10-CM | POA: Diagnosis not present

## 2017-08-06 DIAGNOSIS — I5043 Acute on chronic combined systolic (congestive) and diastolic (congestive) heart failure: Secondary | ICD-10-CM | POA: Diagnosis not present

## 2017-08-06 DIAGNOSIS — M1990 Unspecified osteoarthritis, unspecified site: Secondary | ICD-10-CM | POA: Diagnosis not present

## 2017-08-07 DIAGNOSIS — J449 Chronic obstructive pulmonary disease, unspecified: Secondary | ICD-10-CM | POA: Diagnosis not present

## 2017-08-07 DIAGNOSIS — M1990 Unspecified osteoarthritis, unspecified site: Secondary | ICD-10-CM | POA: Diagnosis not present

## 2017-08-07 DIAGNOSIS — M6281 Muscle weakness (generalized): Secondary | ICD-10-CM | POA: Diagnosis not present

## 2017-08-07 DIAGNOSIS — I5043 Acute on chronic combined systolic (congestive) and diastolic (congestive) heart failure: Secondary | ICD-10-CM | POA: Diagnosis not present

## 2017-08-08 DIAGNOSIS — M6281 Muscle weakness (generalized): Secondary | ICD-10-CM | POA: Diagnosis not present

## 2017-08-08 DIAGNOSIS — M1990 Unspecified osteoarthritis, unspecified site: Secondary | ICD-10-CM | POA: Diagnosis not present

## 2017-08-08 DIAGNOSIS — I5043 Acute on chronic combined systolic (congestive) and diastolic (congestive) heart failure: Secondary | ICD-10-CM | POA: Diagnosis not present

## 2017-08-08 DIAGNOSIS — J449 Chronic obstructive pulmonary disease, unspecified: Secondary | ICD-10-CM | POA: Diagnosis not present

## 2017-08-09 DIAGNOSIS — M6281 Muscle weakness (generalized): Secondary | ICD-10-CM | POA: Diagnosis not present

## 2017-08-09 DIAGNOSIS — I5043 Acute on chronic combined systolic (congestive) and diastolic (congestive) heart failure: Secondary | ICD-10-CM | POA: Diagnosis not present

## 2017-08-09 DIAGNOSIS — M1990 Unspecified osteoarthritis, unspecified site: Secondary | ICD-10-CM | POA: Diagnosis not present

## 2017-08-09 DIAGNOSIS — J449 Chronic obstructive pulmonary disease, unspecified: Secondary | ICD-10-CM | POA: Diagnosis not present

## 2017-08-13 DIAGNOSIS — M6281 Muscle weakness (generalized): Secondary | ICD-10-CM | POA: Diagnosis not present

## 2017-08-13 DIAGNOSIS — I5043 Acute on chronic combined systolic (congestive) and diastolic (congestive) heart failure: Secondary | ICD-10-CM | POA: Diagnosis not present

## 2017-08-13 DIAGNOSIS — J449 Chronic obstructive pulmonary disease, unspecified: Secondary | ICD-10-CM | POA: Diagnosis not present

## 2017-08-13 DIAGNOSIS — M1990 Unspecified osteoarthritis, unspecified site: Secondary | ICD-10-CM | POA: Diagnosis not present

## 2017-08-14 DIAGNOSIS — I5043 Acute on chronic combined systolic (congestive) and diastolic (congestive) heart failure: Secondary | ICD-10-CM | POA: Diagnosis not present

## 2017-08-14 DIAGNOSIS — J449 Chronic obstructive pulmonary disease, unspecified: Secondary | ICD-10-CM | POA: Diagnosis not present

## 2017-08-14 DIAGNOSIS — M1990 Unspecified osteoarthritis, unspecified site: Secondary | ICD-10-CM | POA: Diagnosis not present

## 2017-08-14 DIAGNOSIS — M6281 Muscle weakness (generalized): Secondary | ICD-10-CM | POA: Diagnosis not present

## 2017-08-15 DIAGNOSIS — M1990 Unspecified osteoarthritis, unspecified site: Secondary | ICD-10-CM | POA: Diagnosis not present

## 2017-08-15 DIAGNOSIS — M6281 Muscle weakness (generalized): Secondary | ICD-10-CM | POA: Diagnosis not present

## 2017-08-15 DIAGNOSIS — I5043 Acute on chronic combined systolic (congestive) and diastolic (congestive) heart failure: Secondary | ICD-10-CM | POA: Diagnosis not present

## 2017-08-15 DIAGNOSIS — J449 Chronic obstructive pulmonary disease, unspecified: Secondary | ICD-10-CM | POA: Diagnosis not present

## 2017-08-16 DIAGNOSIS — M1990 Unspecified osteoarthritis, unspecified site: Secondary | ICD-10-CM | POA: Diagnosis not present

## 2017-08-16 DIAGNOSIS — M6281 Muscle weakness (generalized): Secondary | ICD-10-CM | POA: Diagnosis not present

## 2017-08-16 DIAGNOSIS — I5043 Acute on chronic combined systolic (congestive) and diastolic (congestive) heart failure: Secondary | ICD-10-CM | POA: Diagnosis not present

## 2017-08-16 DIAGNOSIS — J449 Chronic obstructive pulmonary disease, unspecified: Secondary | ICD-10-CM | POA: Diagnosis not present

## 2017-08-17 DIAGNOSIS — J449 Chronic obstructive pulmonary disease, unspecified: Secondary | ICD-10-CM | POA: Diagnosis not present

## 2017-08-17 DIAGNOSIS — I5043 Acute on chronic combined systolic (congestive) and diastolic (congestive) heart failure: Secondary | ICD-10-CM | POA: Diagnosis not present

## 2017-08-22 DIAGNOSIS — M6281 Muscle weakness (generalized): Secondary | ICD-10-CM | POA: Diagnosis not present

## 2017-08-22 DIAGNOSIS — I5043 Acute on chronic combined systolic (congestive) and diastolic (congestive) heart failure: Secondary | ICD-10-CM | POA: Diagnosis not present

## 2017-08-22 DIAGNOSIS — J449 Chronic obstructive pulmonary disease, unspecified: Secondary | ICD-10-CM | POA: Diagnosis not present

## 2017-08-22 DIAGNOSIS — M1990 Unspecified osteoarthritis, unspecified site: Secondary | ICD-10-CM | POA: Diagnosis not present

## 2017-08-28 DIAGNOSIS — I5043 Acute on chronic combined systolic (congestive) and diastolic (congestive) heart failure: Secondary | ICD-10-CM | POA: Diagnosis not present

## 2017-08-28 DIAGNOSIS — J449 Chronic obstructive pulmonary disease, unspecified: Secondary | ICD-10-CM | POA: Diagnosis not present

## 2017-08-28 DIAGNOSIS — M6281 Muscle weakness (generalized): Secondary | ICD-10-CM | POA: Diagnosis not present

## 2017-08-28 DIAGNOSIS — M1990 Unspecified osteoarthritis, unspecified site: Secondary | ICD-10-CM | POA: Diagnosis not present

## 2017-08-29 DIAGNOSIS — H10012 Acute follicular conjunctivitis, left eye: Secondary | ICD-10-CM | POA: Diagnosis not present

## 2017-08-30 DIAGNOSIS — I5043 Acute on chronic combined systolic (congestive) and diastolic (congestive) heart failure: Secondary | ICD-10-CM | POA: Diagnosis not present

## 2017-08-30 DIAGNOSIS — J449 Chronic obstructive pulmonary disease, unspecified: Secondary | ICD-10-CM | POA: Diagnosis not present

## 2017-08-30 DIAGNOSIS — M6281 Muscle weakness (generalized): Secondary | ICD-10-CM | POA: Diagnosis not present

## 2017-08-30 DIAGNOSIS — M1990 Unspecified osteoarthritis, unspecified site: Secondary | ICD-10-CM | POA: Diagnosis not present

## 2017-08-31 DIAGNOSIS — J449 Chronic obstructive pulmonary disease, unspecified: Secondary | ICD-10-CM | POA: Diagnosis not present

## 2017-08-31 DIAGNOSIS — M6281 Muscle weakness (generalized): Secondary | ICD-10-CM | POA: Diagnosis not present

## 2017-08-31 DIAGNOSIS — I5043 Acute on chronic combined systolic (congestive) and diastolic (congestive) heart failure: Secondary | ICD-10-CM | POA: Diagnosis not present

## 2017-08-31 DIAGNOSIS — M1990 Unspecified osteoarthritis, unspecified site: Secondary | ICD-10-CM | POA: Diagnosis not present

## 2017-09-05 DIAGNOSIS — I5043 Acute on chronic combined systolic (congestive) and diastolic (congestive) heart failure: Secondary | ICD-10-CM | POA: Diagnosis not present

## 2017-09-05 DIAGNOSIS — M1990 Unspecified osteoarthritis, unspecified site: Secondary | ICD-10-CM | POA: Diagnosis not present

## 2017-09-05 DIAGNOSIS — M6281 Muscle weakness (generalized): Secondary | ICD-10-CM | POA: Diagnosis not present

## 2017-09-05 DIAGNOSIS — J449 Chronic obstructive pulmonary disease, unspecified: Secondary | ICD-10-CM | POA: Diagnosis not present

## 2017-09-07 DIAGNOSIS — M6281 Muscle weakness (generalized): Secondary | ICD-10-CM | POA: Diagnosis not present

## 2017-09-07 DIAGNOSIS — M1990 Unspecified osteoarthritis, unspecified site: Secondary | ICD-10-CM | POA: Diagnosis not present

## 2017-09-07 DIAGNOSIS — J449 Chronic obstructive pulmonary disease, unspecified: Secondary | ICD-10-CM | POA: Diagnosis not present

## 2017-09-07 DIAGNOSIS — I5043 Acute on chronic combined systolic (congestive) and diastolic (congestive) heart failure: Secondary | ICD-10-CM | POA: Diagnosis not present

## 2017-09-12 DIAGNOSIS — D72819 Decreased white blood cell count, unspecified: Secondary | ICD-10-CM | POA: Diagnosis not present

## 2017-09-12 DIAGNOSIS — Z23 Encounter for immunization: Secondary | ICD-10-CM | POA: Diagnosis not present

## 2017-09-13 DIAGNOSIS — M1990 Unspecified osteoarthritis, unspecified site: Secondary | ICD-10-CM | POA: Diagnosis not present

## 2017-09-13 DIAGNOSIS — I5043 Acute on chronic combined systolic (congestive) and diastolic (congestive) heart failure: Secondary | ICD-10-CM | POA: Diagnosis not present

## 2017-09-13 DIAGNOSIS — J449 Chronic obstructive pulmonary disease, unspecified: Secondary | ICD-10-CM | POA: Diagnosis not present

## 2017-09-13 DIAGNOSIS — M6281 Muscle weakness (generalized): Secondary | ICD-10-CM | POA: Diagnosis not present

## 2017-09-16 DIAGNOSIS — J449 Chronic obstructive pulmonary disease, unspecified: Secondary | ICD-10-CM | POA: Diagnosis not present

## 2017-09-16 DIAGNOSIS — I5043 Acute on chronic combined systolic (congestive) and diastolic (congestive) heart failure: Secondary | ICD-10-CM | POA: Diagnosis not present

## 2017-09-17 DIAGNOSIS — J449 Chronic obstructive pulmonary disease, unspecified: Secondary | ICD-10-CM | POA: Diagnosis not present

## 2017-09-17 DIAGNOSIS — I471 Supraventricular tachycardia: Secondary | ICD-10-CM | POA: Diagnosis not present

## 2017-09-17 DIAGNOSIS — M1990 Unspecified osteoarthritis, unspecified site: Secondary | ICD-10-CM | POA: Diagnosis not present

## 2017-09-17 DIAGNOSIS — E1122 Type 2 diabetes mellitus with diabetic chronic kidney disease: Secondary | ICD-10-CM | POA: Diagnosis not present

## 2017-09-27 DIAGNOSIS — M6281 Muscle weakness (generalized): Secondary | ICD-10-CM | POA: Diagnosis not present

## 2017-09-27 DIAGNOSIS — J449 Chronic obstructive pulmonary disease, unspecified: Secondary | ICD-10-CM | POA: Diagnosis not present

## 2017-09-27 DIAGNOSIS — M1990 Unspecified osteoarthritis, unspecified site: Secondary | ICD-10-CM | POA: Diagnosis not present

## 2017-09-27 DIAGNOSIS — E1122 Type 2 diabetes mellitus with diabetic chronic kidney disease: Secondary | ICD-10-CM | POA: Diagnosis not present

## 2017-09-28 DIAGNOSIS — M6281 Muscle weakness (generalized): Secondary | ICD-10-CM | POA: Diagnosis not present

## 2017-09-28 DIAGNOSIS — M1990 Unspecified osteoarthritis, unspecified site: Secondary | ICD-10-CM | POA: Diagnosis not present

## 2017-09-28 DIAGNOSIS — J449 Chronic obstructive pulmonary disease, unspecified: Secondary | ICD-10-CM | POA: Diagnosis not present

## 2017-09-28 DIAGNOSIS — E1122 Type 2 diabetes mellitus with diabetic chronic kidney disease: Secondary | ICD-10-CM | POA: Diagnosis not present

## 2017-10-02 DIAGNOSIS — E1122 Type 2 diabetes mellitus with diabetic chronic kidney disease: Secondary | ICD-10-CM | POA: Diagnosis not present

## 2017-10-02 DIAGNOSIS — M1990 Unspecified osteoarthritis, unspecified site: Secondary | ICD-10-CM | POA: Diagnosis not present

## 2017-10-02 DIAGNOSIS — J449 Chronic obstructive pulmonary disease, unspecified: Secondary | ICD-10-CM | POA: Diagnosis not present

## 2017-10-02 DIAGNOSIS — M6281 Muscle weakness (generalized): Secondary | ICD-10-CM | POA: Diagnosis not present

## 2017-10-03 DIAGNOSIS — J449 Chronic obstructive pulmonary disease, unspecified: Secondary | ICD-10-CM | POA: Diagnosis not present

## 2017-10-03 DIAGNOSIS — E1122 Type 2 diabetes mellitus with diabetic chronic kidney disease: Secondary | ICD-10-CM | POA: Diagnosis not present

## 2017-10-03 DIAGNOSIS — M6281 Muscle weakness (generalized): Secondary | ICD-10-CM | POA: Diagnosis not present

## 2017-10-03 DIAGNOSIS — M1990 Unspecified osteoarthritis, unspecified site: Secondary | ICD-10-CM | POA: Diagnosis not present

## 2017-10-04 DIAGNOSIS — M1990 Unspecified osteoarthritis, unspecified site: Secondary | ICD-10-CM | POA: Diagnosis not present

## 2017-10-04 DIAGNOSIS — J449 Chronic obstructive pulmonary disease, unspecified: Secondary | ICD-10-CM | POA: Diagnosis not present

## 2017-10-04 DIAGNOSIS — M6281 Muscle weakness (generalized): Secondary | ICD-10-CM | POA: Diagnosis not present

## 2017-10-04 DIAGNOSIS — E1122 Type 2 diabetes mellitus with diabetic chronic kidney disease: Secondary | ICD-10-CM | POA: Diagnosis not present

## 2017-10-05 DIAGNOSIS — M1990 Unspecified osteoarthritis, unspecified site: Secondary | ICD-10-CM | POA: Diagnosis not present

## 2017-10-05 DIAGNOSIS — E1122 Type 2 diabetes mellitus with diabetic chronic kidney disease: Secondary | ICD-10-CM | POA: Diagnosis not present

## 2017-10-05 DIAGNOSIS — M6281 Muscle weakness (generalized): Secondary | ICD-10-CM | POA: Diagnosis not present

## 2017-10-05 DIAGNOSIS — J449 Chronic obstructive pulmonary disease, unspecified: Secondary | ICD-10-CM | POA: Diagnosis not present

## 2017-10-08 DIAGNOSIS — R262 Difficulty in walking, not elsewhere classified: Secondary | ICD-10-CM | POA: Diagnosis not present

## 2017-10-08 DIAGNOSIS — M6281 Muscle weakness (generalized): Secondary | ICD-10-CM | POA: Diagnosis not present

## 2017-10-08 DIAGNOSIS — J449 Chronic obstructive pulmonary disease, unspecified: Secondary | ICD-10-CM | POA: Diagnosis not present

## 2017-10-08 DIAGNOSIS — E1122 Type 2 diabetes mellitus with diabetic chronic kidney disease: Secondary | ICD-10-CM | POA: Diagnosis not present

## 2017-10-08 DIAGNOSIS — I5042 Chronic combined systolic (congestive) and diastolic (congestive) heart failure: Secondary | ICD-10-CM | POA: Diagnosis not present

## 2017-10-08 DIAGNOSIS — M1990 Unspecified osteoarthritis, unspecified site: Secondary | ICD-10-CM | POA: Diagnosis not present

## 2017-10-08 IMAGING — CR DG CHEST 1V PORT
1 series · 1 of 1 positions shown · non-contrast
Comparison: October 19, 2016

CLINICAL DATA: Respiratory failure with fever.

EXAM:
PORTABLE CHEST 1 VIEW

[portable]
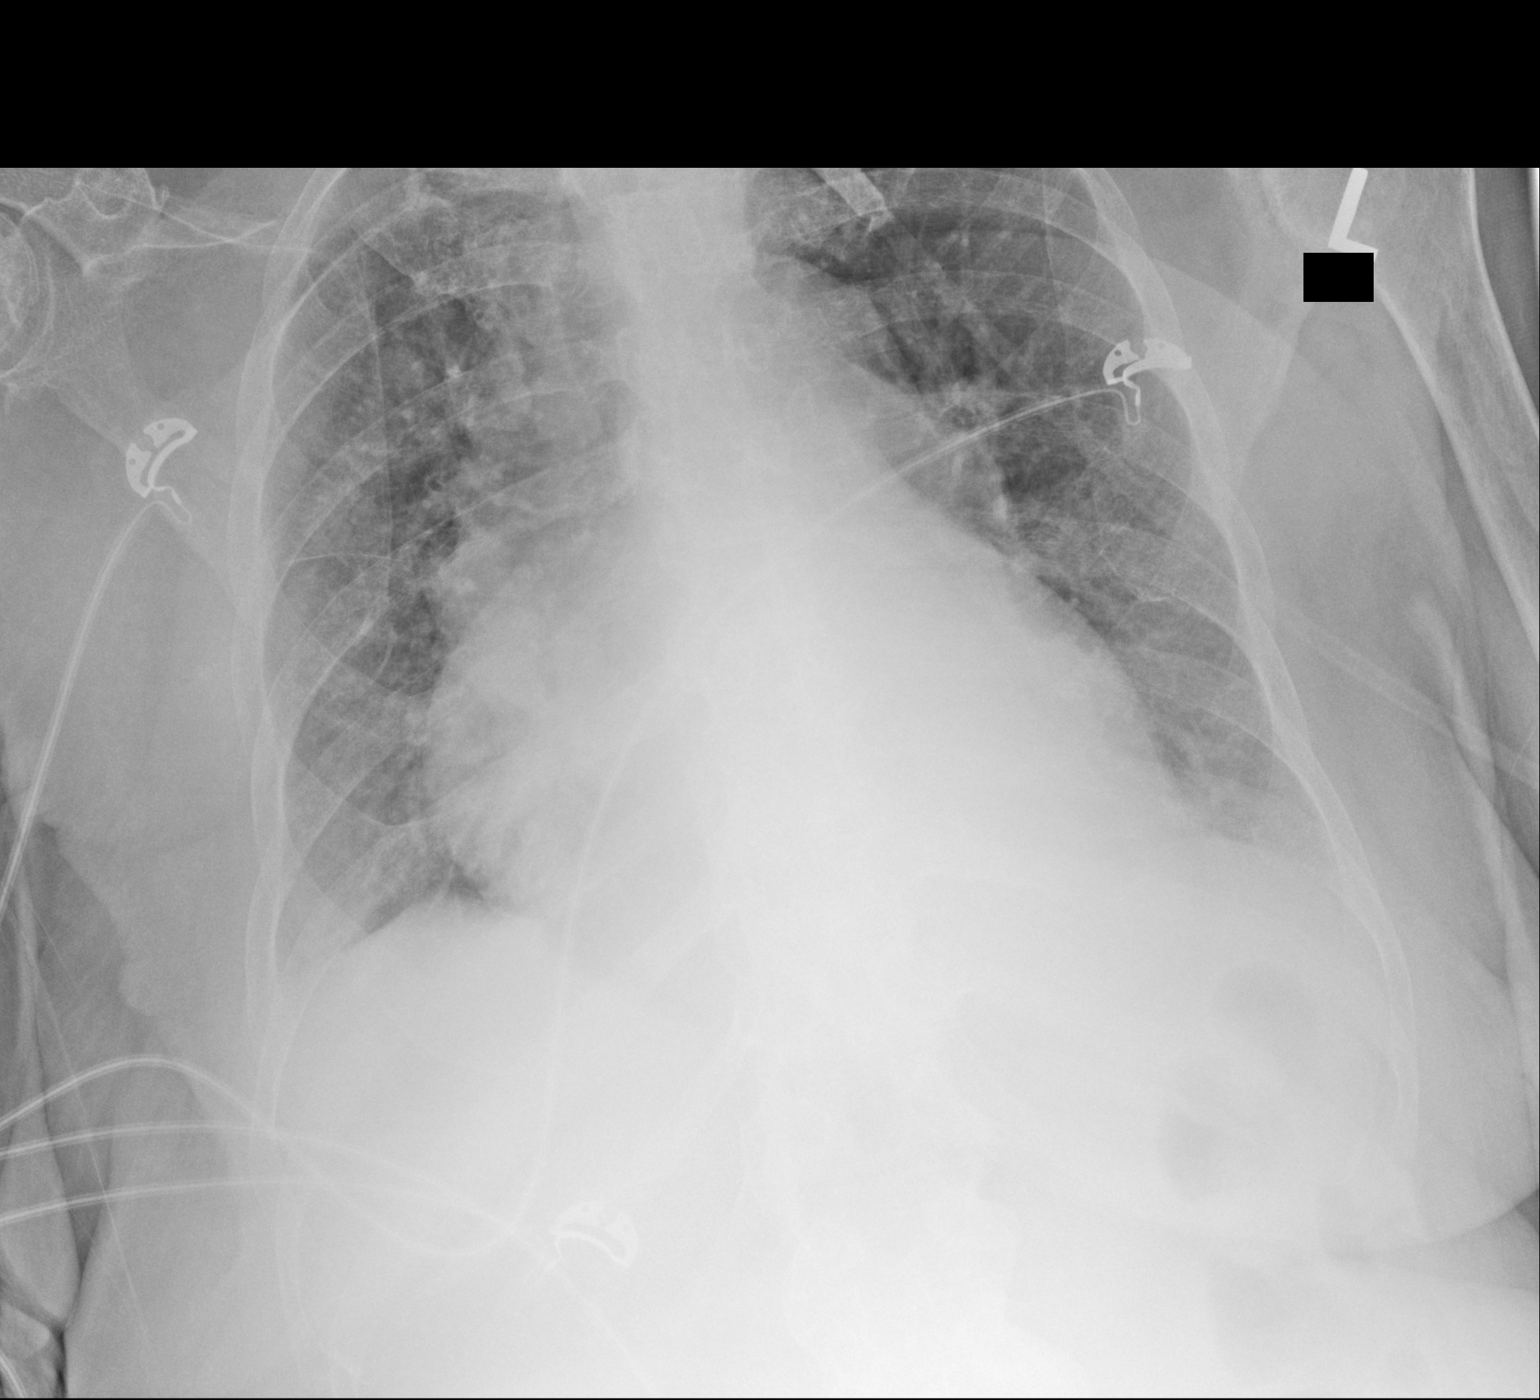

[1 of 1 positions shown; findings below may reference images not displayed]

FINDINGS: Cardiomegaly and pulmonary venous congestion. Poor evaluation of the
right upper lobe. A nodular density in this region is felt the
likely be artifactual was not present previously. No other interval
changes or acute abnormalities.
IMPRESSION: Limited study with cardiomegaly and pulmonary venous congestion.
Probable artifactual nodular density in the right upper lobe.
Recommend PA and lateral chest x-ray before discharge.

## 2017-10-09 DIAGNOSIS — H04123 Dry eye syndrome of bilateral lacrimal glands: Secondary | ICD-10-CM | POA: Diagnosis not present

## 2017-10-09 DIAGNOSIS — H10012 Acute follicular conjunctivitis, left eye: Secondary | ICD-10-CM | POA: Diagnosis not present

## 2017-10-10 DIAGNOSIS — E1122 Type 2 diabetes mellitus with diabetic chronic kidney disease: Secondary | ICD-10-CM | POA: Diagnosis not present

## 2017-10-10 DIAGNOSIS — M6281 Muscle weakness (generalized): Secondary | ICD-10-CM | POA: Diagnosis not present

## 2017-10-10 DIAGNOSIS — M1990 Unspecified osteoarthritis, unspecified site: Secondary | ICD-10-CM | POA: Diagnosis not present

## 2017-10-10 DIAGNOSIS — J449 Chronic obstructive pulmonary disease, unspecified: Secondary | ICD-10-CM | POA: Diagnosis not present

## 2017-10-12 DIAGNOSIS — M1990 Unspecified osteoarthritis, unspecified site: Secondary | ICD-10-CM | POA: Diagnosis not present

## 2017-10-12 DIAGNOSIS — E1122 Type 2 diabetes mellitus with diabetic chronic kidney disease: Secondary | ICD-10-CM | POA: Diagnosis not present

## 2017-10-12 DIAGNOSIS — J449 Chronic obstructive pulmonary disease, unspecified: Secondary | ICD-10-CM | POA: Diagnosis not present

## 2017-10-12 DIAGNOSIS — M6281 Muscle weakness (generalized): Secondary | ICD-10-CM | POA: Diagnosis not present

## 2017-10-12 IMAGING — XA IR PERC PLACEMENT GASTROSTOMY
2 series · 2 of 2 positions shown · non-contrast
Comparison: none

INDICATION: 75-year-old female with a history of tracheal perforation and
retropharyngeal abscess. Gastrostomy tube placement is required as
the patient's remain NPO for an extended period of time.

[Series 1: fl angio · 1 of 1 slices shown (1 of 2)]
[im 1/1]
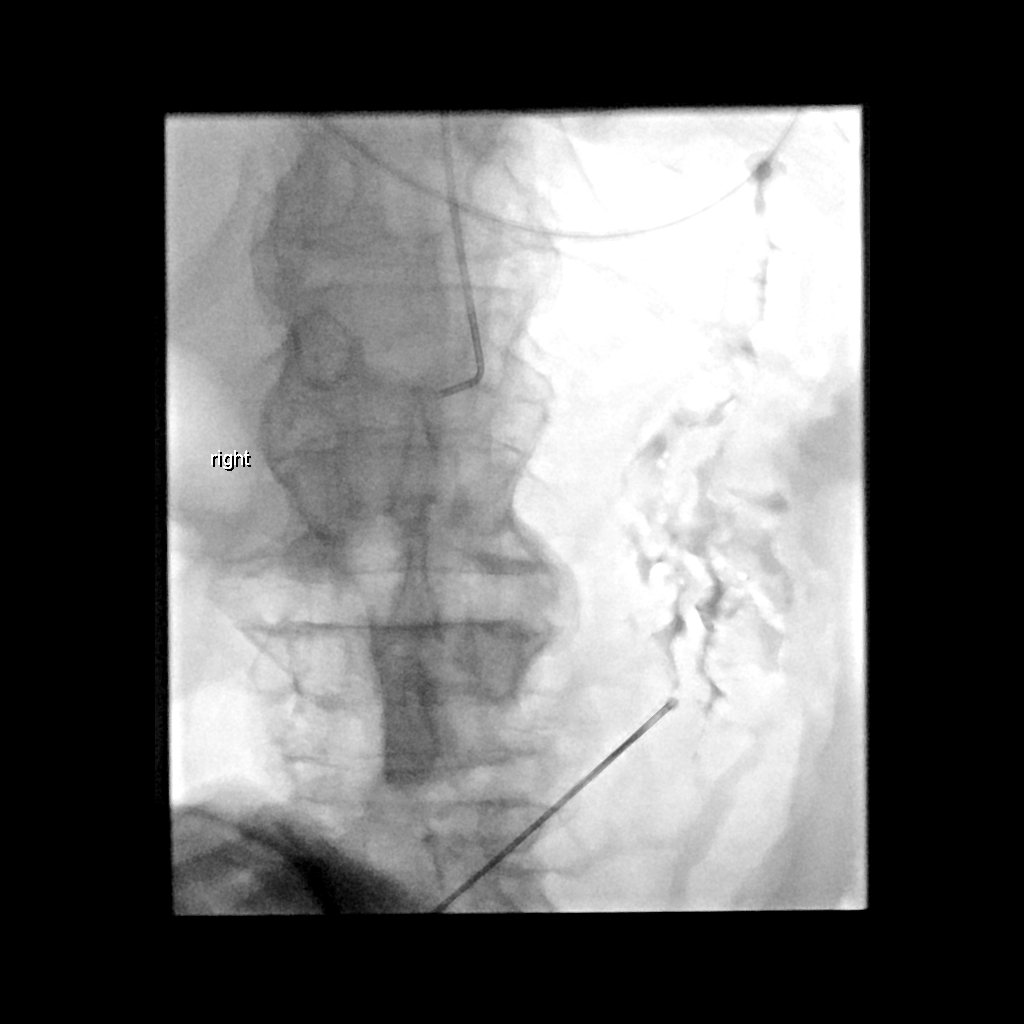

[Series 2: fl angio · 1 of 1 slices shown (2 of 2)]
[im 1/1]
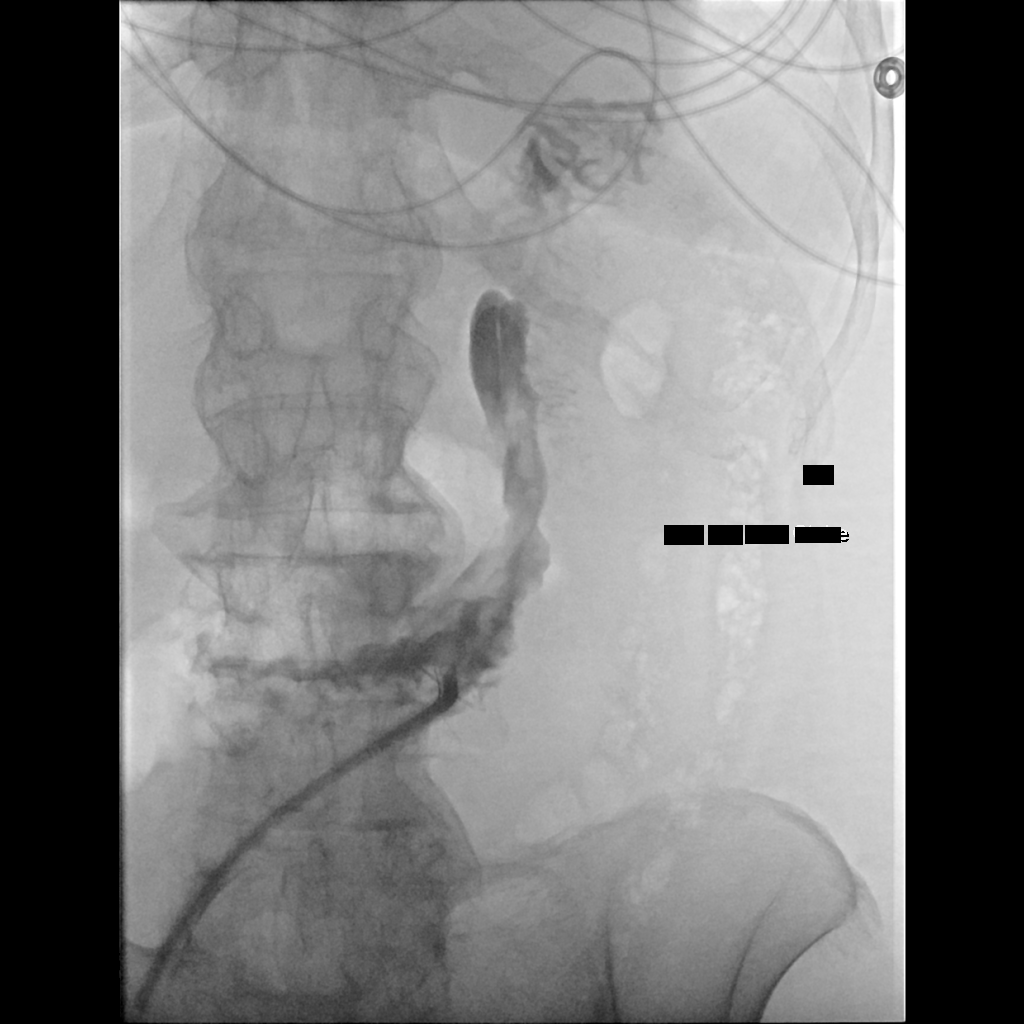

[2 of 2 positions shown; findings below may reference images not displayed]

EXAM:
Fluoroscopically guided placement of percutaneous pull-through
gastrostomy tube

MEDICATIONS:
Currently receiving intravenous antibiotics during this hospital
admission (Unasyn). Most recent dose administered within 1 hour of
skin incision. 1 mg glucagon also administered intravenously.

ANESTHESIA/SEDATION:
Versed 3 mg IV; Fentanyl 125 mcg IV

Moderate Sedation Time:  45 minutes

The patient was continuously monitored during the procedure by the
interventional radiology nurse under my direct supervision.

CONTRAST:  10 mL Dsovue-HUU

FLUOROSCOPY TIME:  Fluoroscopy Time: 2 minutes 12 seconds (37 mGy).

COMPLICATIONS:
None immediate.

PROCEDURE:
Informed written consent was obtained from the patient after a
thorough discussion of the procedural risks, benefits and
alternatives. All questions were addressed. Maximal Sterile Barrier
Technique was utilized including caps, mask, sterile gowns, sterile
gloves, sterile drape, hand hygiene and skin antiseptic. A timeout
was performed prior to the initiation of the procedure.

Maximal barrier sterile technique utilized including caps, mask,
sterile gowns, sterile gloves, large sterile drape, hand hygiene,
and chlorhexadine skin prep.

An angled catheter was advanced over a wire under fluoroscopic
guidance through the nose, down the esophagus and into the body of
the stomach. The stomach was then insufflated with several 100 ml of
air. Fluoroscopy confirmed location of the gastric bubble, as well
as inferior displacement of the barium stained colon. Under direct
fluoroscopic guidance, a single T-tack was placed, and the anterior
gastric wall drawn up against the anterior abdominal wall.
Percutaneous access was then obtained into the mid gastric body with
an 18 gauge sheath needle. Aspiration of air, and injection of
contrast material under fluoroscopy confirmed needle placement.

An Amplatz wire was advanced in the gastric body and the access
needle exchanged for a 9-French vascular sheath. A snare device was
advanced through the vascular sheath and an Amplatz wire advanced
through the angled catheter. The Amplatz wire was successfully
snared and this was pulled up through the esophagus and out the
mouth. A 20-Tohle Lka tube was then connected to
the snare and pulled through the mouth, down the esophagus, into the
stomach and out to the anterior abdominal wall. Hand injection of
contrast material confirmed intragastric location. The T-tack
retention suture was then cut. The pull through peg tube was then
secured with the external bumper and capped.

The patient will be observed for several hours with the newly placed
tube on low wall suction to evaluate for any post procedure
complication. The patient tolerated the procedure well, there is no
immediate complication.
IMPRESSION: Successful placement of a 20 French pull through gastrostomy tube.

## 2017-10-13 IMAGING — CR DG CHEST 1V PORT
1 series · 1 of 1 positions shown · non-contrast
Comparison: 10/21/2016 and earlier.

CLINICAL DATA: 75-year-old female line placement. Initial
encounter.

EXAM:
PORTABLE CHEST 1 VIEW

[portable]
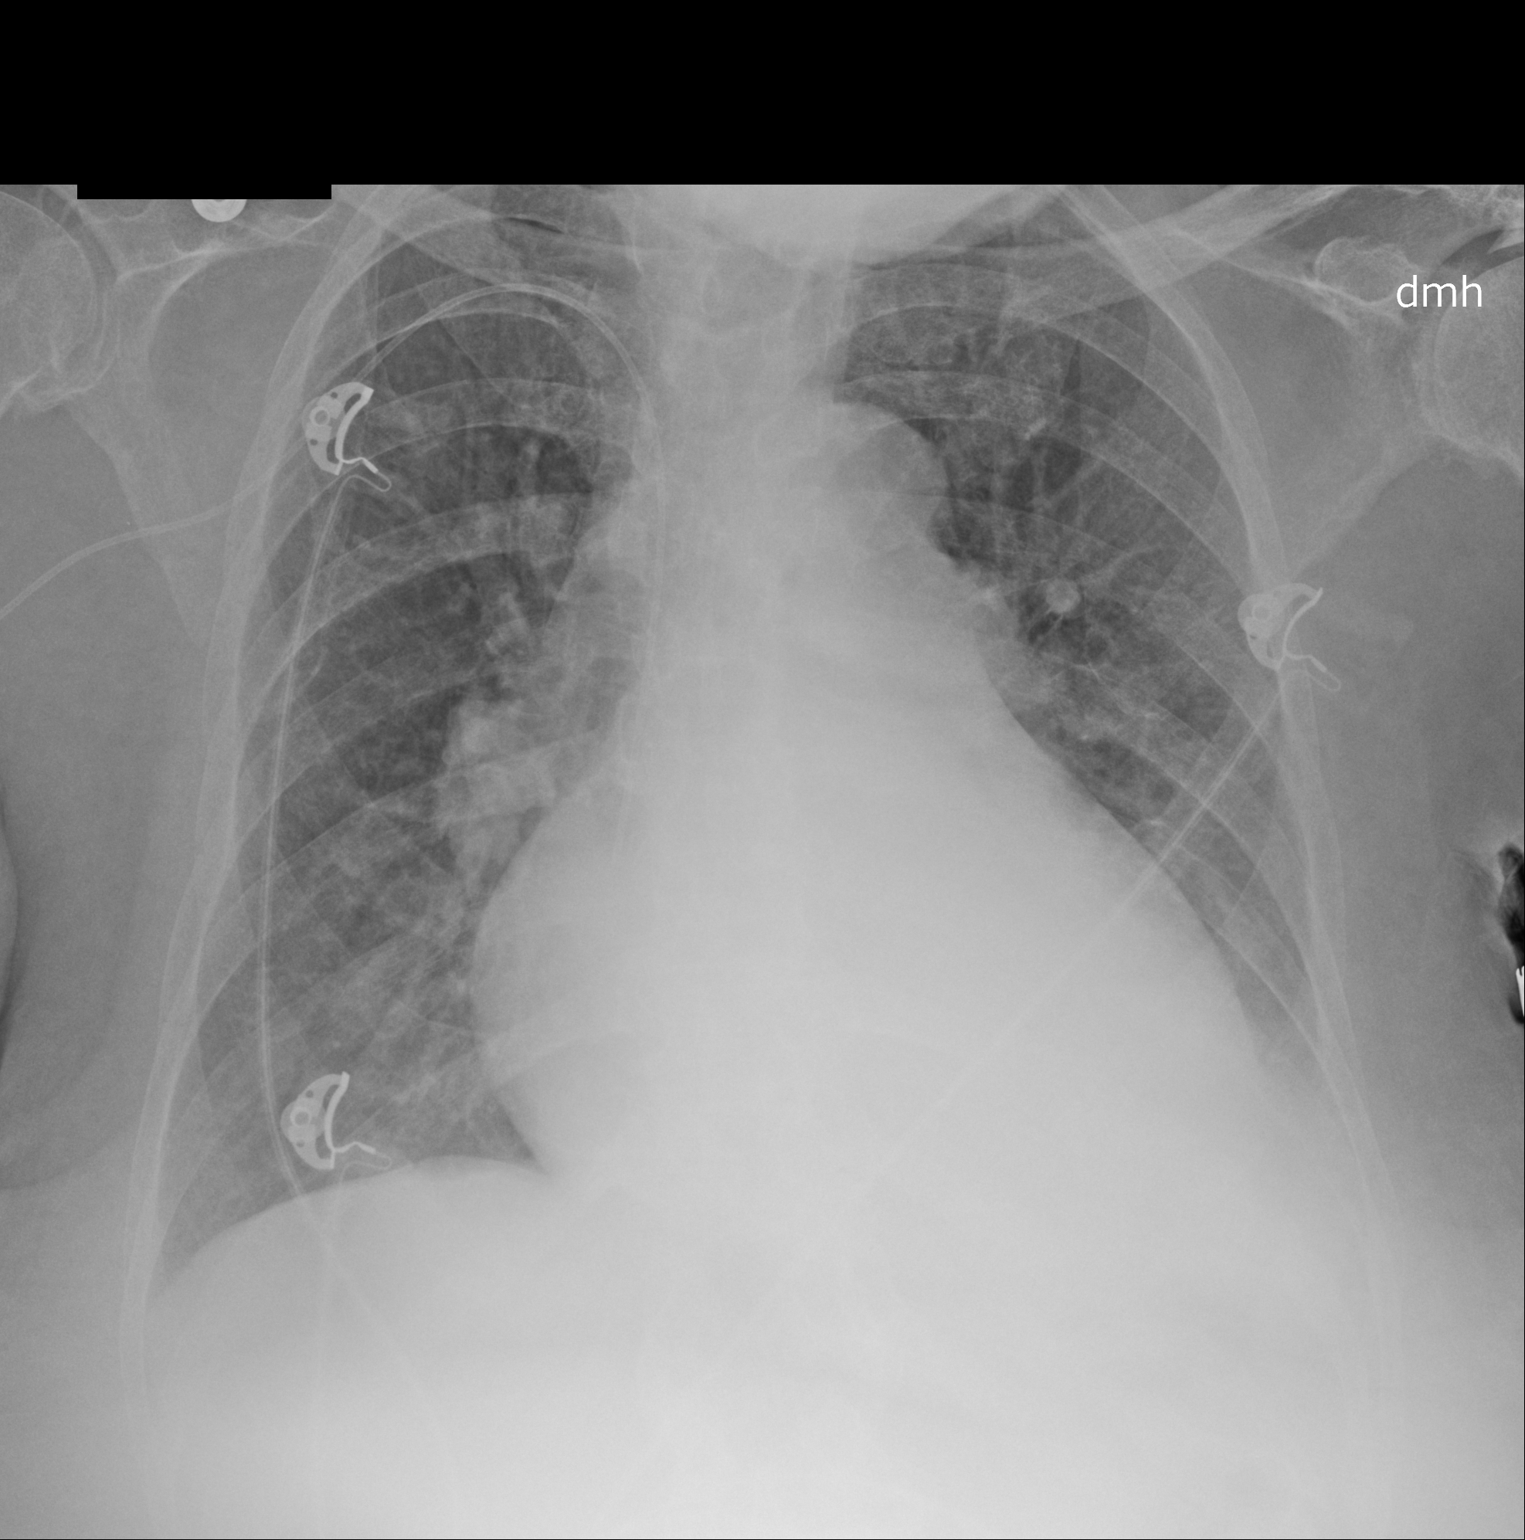

[1 of 1 positions shown; findings below may reference images not displayed]

FINDINGS: Portable AP semi upright view at [id number] hours. Right upper extremity
approach PICC line catheter now in place. Tip projects at the
cavoatrial junction level.

Stable cardiomegaly and mediastinal contours. Dense retrocardiac
opacification and obscuration of the left hemidiaphragm. Mildly
decreased pulmonary vascularity since 10/21/2016. No pneumothorax.
Possible small left pleural effusion.
IMPRESSION: 1. Right PICC line in place, tip at the cavoatrial junction level.
2. Left greater than right lower lobe collapse or consolidation.
Probable small left pleural effusion.
3. Mildly decreased pulmonary vascular congestion.

## 2017-10-16 DIAGNOSIS — M6281 Muscle weakness (generalized): Secondary | ICD-10-CM | POA: Diagnosis not present

## 2017-10-16 DIAGNOSIS — J449 Chronic obstructive pulmonary disease, unspecified: Secondary | ICD-10-CM | POA: Diagnosis not present

## 2017-10-16 DIAGNOSIS — M1990 Unspecified osteoarthritis, unspecified site: Secondary | ICD-10-CM | POA: Diagnosis not present

## 2017-10-16 DIAGNOSIS — E1122 Type 2 diabetes mellitus with diabetic chronic kidney disease: Secondary | ICD-10-CM | POA: Diagnosis not present

## 2017-10-18 DIAGNOSIS — E1122 Type 2 diabetes mellitus with diabetic chronic kidney disease: Secondary | ICD-10-CM | POA: Diagnosis not present

## 2017-10-18 DIAGNOSIS — M6281 Muscle weakness (generalized): Secondary | ICD-10-CM | POA: Diagnosis not present

## 2017-10-18 DIAGNOSIS — J449 Chronic obstructive pulmonary disease, unspecified: Secondary | ICD-10-CM | POA: Diagnosis not present

## 2017-10-18 DIAGNOSIS — M1990 Unspecified osteoarthritis, unspecified site: Secondary | ICD-10-CM | POA: Diagnosis not present

## 2017-10-23 DIAGNOSIS — M1990 Unspecified osteoarthritis, unspecified site: Secondary | ICD-10-CM | POA: Diagnosis not present

## 2017-10-23 DIAGNOSIS — E1122 Type 2 diabetes mellitus with diabetic chronic kidney disease: Secondary | ICD-10-CM | POA: Diagnosis not present

## 2017-10-23 DIAGNOSIS — J449 Chronic obstructive pulmonary disease, unspecified: Secondary | ICD-10-CM | POA: Diagnosis not present

## 2017-10-23 DIAGNOSIS — M6281 Muscle weakness (generalized): Secondary | ICD-10-CM | POA: Diagnosis not present

## 2017-10-25 DIAGNOSIS — E1121 Type 2 diabetes mellitus with diabetic nephropathy: Secondary | ICD-10-CM | POA: Diagnosis not present

## 2017-10-25 DIAGNOSIS — N3941 Urge incontinence: Secondary | ICD-10-CM | POA: Diagnosis not present

## 2017-10-25 DIAGNOSIS — D61818 Other pancytopenia: Secondary | ICD-10-CM | POA: Diagnosis not present

## 2017-10-25 DIAGNOSIS — E785 Hyperlipidemia, unspecified: Secondary | ICD-10-CM | POA: Diagnosis not present

## 2017-10-25 DIAGNOSIS — I129 Hypertensive chronic kidney disease with stage 1 through stage 4 chronic kidney disease, or unspecified chronic kidney disease: Secondary | ICD-10-CM | POA: Diagnosis not present

## 2017-10-25 DIAGNOSIS — E1140 Type 2 diabetes mellitus with diabetic neuropathy, unspecified: Secondary | ICD-10-CM | POA: Diagnosis not present

## 2017-10-25 DIAGNOSIS — N183 Chronic kidney disease, stage 3 (moderate): Secondary | ICD-10-CM | POA: Diagnosis not present

## 2017-10-26 DIAGNOSIS — M6281 Muscle weakness (generalized): Secondary | ICD-10-CM | POA: Diagnosis not present

## 2017-10-26 DIAGNOSIS — E1122 Type 2 diabetes mellitus with diabetic chronic kidney disease: Secondary | ICD-10-CM | POA: Diagnosis not present

## 2017-10-26 DIAGNOSIS — M1990 Unspecified osteoarthritis, unspecified site: Secondary | ICD-10-CM | POA: Diagnosis not present

## 2017-10-26 DIAGNOSIS — J449 Chronic obstructive pulmonary disease, unspecified: Secondary | ICD-10-CM | POA: Diagnosis not present

## 2017-11-02 DIAGNOSIS — E1122 Type 2 diabetes mellitus with diabetic chronic kidney disease: Secondary | ICD-10-CM | POA: Diagnosis not present

## 2017-11-02 DIAGNOSIS — M1990 Unspecified osteoarthritis, unspecified site: Secondary | ICD-10-CM | POA: Diagnosis not present

## 2017-11-02 DIAGNOSIS — J449 Chronic obstructive pulmonary disease, unspecified: Secondary | ICD-10-CM | POA: Diagnosis not present

## 2017-11-02 DIAGNOSIS — M6281 Muscle weakness (generalized): Secondary | ICD-10-CM | POA: Diagnosis not present

## 2017-11-05 ENCOUNTER — Inpatient Hospital Stay (HOSPITAL_COMMUNITY)
Admission: EM | Admit: 2017-11-05 | Discharge: 2017-11-11 | DRG: 291 | Disposition: A | Discharge status: Home Health | Payer: Medicare HMO | Attending: Internal Medicine | Admitting: Internal Medicine

## 2017-11-05 ENCOUNTER — Emergency Department (HOSPITAL_COMMUNITY): Payer: Medicare HMO

## 2017-11-05 ENCOUNTER — Other Ambulatory Visit: Payer: Self-pay

## 2017-11-05 ENCOUNTER — Encounter (HOSPITAL_COMMUNITY): Payer: Self-pay

## 2017-11-05 ENCOUNTER — Telehealth: Payer: Self-pay | Admitting: Hematology

## 2017-11-05 DIAGNOSIS — R0602 Shortness of breath: Secondary | ICD-10-CM | POA: Diagnosis not present

## 2017-11-05 DIAGNOSIS — J181 Lobar pneumonia, unspecified organism: Secondary | ICD-10-CM

## 2017-11-05 DIAGNOSIS — Z9049 Acquired absence of other specified parts of digestive tract: Secondary | ICD-10-CM | POA: Diagnosis not present

## 2017-11-05 DIAGNOSIS — R42 Dizziness and giddiness: Secondary | ICD-10-CM | POA: Diagnosis not present

## 2017-11-05 DIAGNOSIS — K219 Gastro-esophageal reflux disease without esophagitis: Secondary | ICD-10-CM | POA: Diagnosis present

## 2017-11-05 DIAGNOSIS — I361 Nonrheumatic tricuspid (valve) insufficiency: Secondary | ICD-10-CM | POA: Diagnosis not present

## 2017-11-05 DIAGNOSIS — N2581 Secondary hyperparathyroidism of renal origin: Secondary | ICD-10-CM | POA: Diagnosis present

## 2017-11-05 DIAGNOSIS — Z833 Family history of diabetes mellitus: Secondary | ICD-10-CM | POA: Diagnosis not present

## 2017-11-05 DIAGNOSIS — E1122 Type 2 diabetes mellitus with diabetic chronic kidney disease: Secondary | ICD-10-CM | POA: Diagnosis present

## 2017-11-05 DIAGNOSIS — I5040 Unspecified combined systolic (congestive) and diastolic (congestive) heart failure: Secondary | ICD-10-CM | POA: Diagnosis not present

## 2017-11-05 DIAGNOSIS — R7989 Other specified abnormal findings of blood chemistry: Secondary | ICD-10-CM

## 2017-11-05 DIAGNOSIS — G4700 Insomnia, unspecified: Secondary | ICD-10-CM | POA: Diagnosis present

## 2017-11-05 DIAGNOSIS — I5043 Acute on chronic combined systolic (congestive) and diastolic (congestive) heart failure: Secondary | ICD-10-CM | POA: Diagnosis not present

## 2017-11-05 DIAGNOSIS — R778 Other specified abnormalities of plasma proteins: Secondary | ICD-10-CM

## 2017-11-05 DIAGNOSIS — N186 End stage renal disease: Secondary | ICD-10-CM

## 2017-11-05 DIAGNOSIS — I2510 Atherosclerotic heart disease of native coronary artery without angina pectoris: Secondary | ICD-10-CM | POA: Diagnosis present

## 2017-11-05 DIAGNOSIS — I214 Non-ST elevation (NSTEMI) myocardial infarction: Secondary | ICD-10-CM | POA: Diagnosis not present

## 2017-11-05 DIAGNOSIS — N184 Chronic kidney disease, stage 4 (severe): Secondary | ICD-10-CM | POA: Diagnosis not present

## 2017-11-05 DIAGNOSIS — R9431 Abnormal electrocardiogram [ECG] [EKG]: Secondary | ICD-10-CM | POA: Diagnosis not present

## 2017-11-05 DIAGNOSIS — T502X5A Adverse effect of carbonic-anhydrase inhibitors, benzothiadiazides and other diuretics, initial encounter: Secondary | ICD-10-CM | POA: Diagnosis not present

## 2017-11-05 DIAGNOSIS — D72819 Decreased white blood cell count, unspecified: Secondary | ICD-10-CM | POA: Diagnosis present

## 2017-11-05 DIAGNOSIS — R262 Difficulty in walking, not elsewhere classified: Secondary | ICD-10-CM | POA: Diagnosis not present

## 2017-11-05 DIAGNOSIS — I130 Hypertensive heart and chronic kidney disease with heart failure and stage 1 through stage 4 chronic kidney disease, or unspecified chronic kidney disease: Secondary | ICD-10-CM | POA: Diagnosis not present

## 2017-11-05 DIAGNOSIS — E785 Hyperlipidemia, unspecified: Secondary | ICD-10-CM | POA: Diagnosis present

## 2017-11-05 DIAGNOSIS — Z7984 Long term (current) use of oral hypoglycemic drugs: Secondary | ICD-10-CM

## 2017-11-05 DIAGNOSIS — Z6835 Body mass index (BMI) 35.0-35.9, adult: Secondary | ICD-10-CM

## 2017-11-05 DIAGNOSIS — D631 Anemia in chronic kidney disease: Secondary | ICD-10-CM | POA: Diagnosis present

## 2017-11-05 DIAGNOSIS — Z66 Do not resuscitate: Secondary | ICD-10-CM | POA: Diagnosis present

## 2017-11-05 DIAGNOSIS — J189 Pneumonia, unspecified organism: Secondary | ICD-10-CM | POA: Diagnosis not present

## 2017-11-05 DIAGNOSIS — Z87891 Personal history of nicotine dependence: Secondary | ICD-10-CM

## 2017-11-05 DIAGNOSIS — I10 Essential (primary) hypertension: Secondary | ICD-10-CM | POA: Diagnosis not present

## 2017-11-05 DIAGNOSIS — G8194 Hemiplegia, unspecified affecting left nondominant side: Secondary | ICD-10-CM | POA: Diagnosis not present

## 2017-11-05 DIAGNOSIS — N189 Chronic kidney disease, unspecified: Secondary | ICD-10-CM | POA: Diagnosis present

## 2017-11-05 DIAGNOSIS — D696 Thrombocytopenia, unspecified: Secondary | ICD-10-CM | POA: Diagnosis present

## 2017-11-05 DIAGNOSIS — Z7982 Long term (current) use of aspirin: Secondary | ICD-10-CM | POA: Diagnosis not present

## 2017-11-05 DIAGNOSIS — E119 Type 2 diabetes mellitus without complications: Secondary | ICD-10-CM

## 2017-11-05 DIAGNOSIS — M1990 Unspecified osteoarthritis, unspecified site: Secondary | ICD-10-CM | POA: Diagnosis not present

## 2017-11-05 DIAGNOSIS — I129 Hypertensive chronic kidney disease with stage 1 through stage 4 chronic kidney disease, or unspecified chronic kidney disease: Secondary | ICD-10-CM | POA: Diagnosis not present

## 2017-11-05 DIAGNOSIS — Z8249 Family history of ischemic heart disease and other diseases of the circulatory system: Secondary | ICD-10-CM | POA: Diagnosis not present

## 2017-11-05 DIAGNOSIS — N179 Acute kidney failure, unspecified: Secondary | ICD-10-CM | POA: Diagnosis not present

## 2017-11-05 DIAGNOSIS — E876 Hypokalemia: Secondary | ICD-10-CM | POA: Diagnosis not present

## 2017-11-05 DIAGNOSIS — I248 Other forms of acute ischemic heart disease: Secondary | ICD-10-CM | POA: Diagnosis present

## 2017-11-05 DIAGNOSIS — N183 Chronic kidney disease, stage 3 (moderate): Secondary | ICD-10-CM | POA: Diagnosis not present

## 2017-11-05 DIAGNOSIS — E8889 Other specified metabolic disorders: Secondary | ICD-10-CM | POA: Diagnosis present

## 2017-11-05 DIAGNOSIS — M6281 Muscle weakness (generalized): Secondary | ICD-10-CM | POA: Diagnosis not present

## 2017-11-05 DIAGNOSIS — I43 Cardiomyopathy in diseases classified elsewhere: Secondary | ICD-10-CM | POA: Diagnosis not present

## 2017-11-05 DIAGNOSIS — Z90710 Acquired absence of both cervix and uterus: Secondary | ICD-10-CM | POA: Diagnosis not present

## 2017-11-05 DIAGNOSIS — E0822 Diabetes mellitus due to underlying condition with diabetic chronic kidney disease: Secondary | ICD-10-CM

## 2017-11-05 DIAGNOSIS — R069 Unspecified abnormalities of breathing: Secondary | ICD-10-CM | POA: Diagnosis not present

## 2017-11-05 DIAGNOSIS — E669 Obesity, unspecified: Secondary | ICD-10-CM | POA: Diagnosis present

## 2017-11-05 DIAGNOSIS — I5042 Chronic combined systolic (congestive) and diastolic (congestive) heart failure: Secondary | ICD-10-CM | POA: Diagnosis not present

## 2017-11-05 DIAGNOSIS — Z992 Dependence on renal dialysis: Secondary | ICD-10-CM

## 2017-11-05 DIAGNOSIS — J449 Chronic obstructive pulmonary disease, unspecified: Secondary | ICD-10-CM | POA: Diagnosis not present

## 2017-11-05 LAB — BASIC METABOLIC PANEL
Anion gap: 9 (ref 5–15)
BUN: 47 mg/dL — ABNORMAL HIGH (ref 6–20)
CALCIUM: 10 mg/dL (ref 8.9–10.3)
CO2: 26 mmol/L (ref 22–32)
CREATININE: 2.51 mg/dL — AB (ref 0.44–1.00)
Chloride: 108 mmol/L (ref 101–111)
GFR calc Af Amer: 20 mL/min — ABNORMAL LOW (ref >60)
GFR, EST NON AFRICAN AMERICAN: 18 mL/min — AB (ref >60)
GLUCOSE: 114 mg/dL — AB (ref 65–99)
Potassium: 3.9 mmol/L (ref 3.5–5.1)
Sodium: 143 mmol/L (ref 135–145)

## 2017-11-05 LAB — CBC WITH DIFFERENTIAL/PLATELET
BASOS PCT: 1 %
Basophils Absolute: 0 10*3/uL (ref 0.0–0.1)
EOS PCT: 2 %
Eosinophils Absolute: 0.1 10*3/uL (ref 0.0–0.7)
HEMATOCRIT: 37.9 % (ref 36.0–46.0)
Hemoglobin: 13 g/dL (ref 12.0–15.0)
LYMPHS PCT: 29 %
Lymphs Abs: 1 10*3/uL (ref 0.7–4.0)
MCH: 30.5 pg (ref 26.0–34.0)
MCHC: 34.3 g/dL (ref 30.0–36.0)
MCV: 89 fL (ref 78.0–100.0)
MONO ABS: 0.4 10*3/uL (ref 0.1–1.0)
MONOS PCT: 12 %
NEUTROS ABS: 2 10*3/uL (ref 1.7–7.7)
Neutrophils Relative %: 56 %
PLATELETS: 94 10*3/uL — AB (ref 150–400)
RBC: 4.26 MIL/uL (ref 3.87–5.11)
RDW: 16.7 % — AB (ref 11.5–15.5)
WBC: 3.5 10*3/uL — ABNORMAL LOW (ref 4.0–10.5)

## 2017-11-05 LAB — I-STAT TROPONIN, ED: Troponin i, poc: 0.34 ng/mL (ref 0.00–0.08)

## 2017-11-05 LAB — TROPONIN I: TROPONIN I: 0.37 ng/mL — AB (ref <0.03)

## 2017-11-05 LAB — BRAIN NATRIURETIC PEPTIDE: B Natriuretic Peptide: 1651.6 pg/mL — ABNORMAL HIGH (ref 0.0–100.0)

## 2017-11-05 MED ORDER — AZITHROMYCIN 500 MG IV SOLR
500.0000 mg | Freq: Once | INTRAVENOUS | Status: AC
  Administered 2017-11-05: 500 mg via INTRAVENOUS
  Filled 2017-11-05: qty 500

## 2017-11-05 MED ORDER — ADULT MULTIVITAMIN W/MINERALS CH
1.0000 | ORAL_TABLET | Freq: Every morning | ORAL | Status: DC
  Administered 2017-11-06 – 2017-11-11 (×6): 1 via ORAL
  Filled 2017-11-05 (×6): qty 1

## 2017-11-05 MED ORDER — FUROSEMIDE 10 MG/ML IJ SOLN
40.0000 mg | Freq: Two times a day (BID) | INTRAMUSCULAR | Status: DC
  Administered 2017-11-05 – 2017-11-06 (×2): 40 mg via INTRAVENOUS
  Filled 2017-11-05 (×2): qty 4

## 2017-11-05 MED ORDER — FERROUS SULFATE 325 (65 FE) MG PO TABS
325.0000 mg | ORAL_TABLET | Freq: Every day | ORAL | Status: DC
  Administered 2017-11-06 – 2017-11-09 (×4): 325 mg via ORAL
  Filled 2017-11-05 (×5): qty 1

## 2017-11-05 MED ORDER — NORTRIPTYLINE HCL 10 MG PO CAPS
10.0000 mg | ORAL_CAPSULE | Freq: Every day | ORAL | Status: DC
  Administered 2017-11-05: 10 mg via ORAL
  Filled 2017-11-05: qty 1

## 2017-11-05 MED ORDER — ASPIRIN 81 MG PO CHEW
324.0000 mg | CHEWABLE_TABLET | Freq: Once | ORAL | Status: AC
  Administered 2017-11-05: 324 mg via ORAL
  Filled 2017-11-05: qty 4

## 2017-11-05 MED ORDER — DEXTROSE 5 % IV SOLN
1.0000 g | INTRAVENOUS | Status: DC
  Administered 2017-11-06 – 2017-11-10 (×5): 1 g via INTRAVENOUS
  Filled 2017-11-05 (×7): qty 10

## 2017-11-05 MED ORDER — SODIUM CHLORIDE 0.9 % IV SOLN: 250.0000 mL | INTRAVENOUS | Status: DC | PRN

## 2017-11-05 MED ORDER — ONDANSETRON HCL 4 MG/2ML IJ SOLN: 4.0000 mg | Freq: Four times a day (QID) | INTRAMUSCULAR | Status: DC | PRN

## 2017-11-05 MED ORDER — SODIUM CHLORIDE 0.9% FLUSH
3.0000 mL | Freq: Two times a day (BID) | INTRAVENOUS | Status: DC
  Administered 2017-11-05 – 2017-11-11 (×11): 3 mL via INTRAVENOUS

## 2017-11-05 MED ORDER — SODIUM CHLORIDE 0.9% FLUSH: 3.0000 mL | INTRAVENOUS | Status: DC | PRN

## 2017-11-05 MED ORDER — INSULIN ASPART 100 UNIT/ML ~~LOC~~ SOLN
0.0000 [IU] | Freq: Three times a day (TID) | SUBCUTANEOUS | Status: DC
  Administered 2017-11-06 – 2017-11-09 (×2): 2 [IU] via SUBCUTANEOUS
  Administered 2017-11-09: 3 [IU] via SUBCUTANEOUS
  Administered 2017-11-10 – 2017-11-11 (×2): 1 [IU] via SUBCUTANEOUS

## 2017-11-05 MED ORDER — METOPROLOL TARTRATE 25 MG PO TABS
25.0000 mg | ORAL_TABLET | Freq: Two times a day (BID) | ORAL | Status: DC
  Administered 2017-11-05 – 2017-11-08 (×6): 25 mg via ORAL
  Filled 2017-11-05 (×6): qty 1

## 2017-11-05 MED ORDER — DEXTROSE 5 % IV SOLN
1.0000 g | Freq: Once | INTRAVENOUS | Status: AC
  Administered 2017-11-05: 1 g via INTRAVENOUS
  Filled 2017-11-05: qty 10

## 2017-11-05 MED ORDER — DEXTROSE 5 % IV SOLN: 500.0000 mg | INTRAVENOUS | Status: DC

## 2017-11-05 MED ORDER — ACETAMINOPHEN 325 MG PO TABS: 650.0000 mg | ORAL_TABLET | ORAL | Status: DC | PRN

## 2017-11-05 MED ORDER — ASPIRIN EC 81 MG PO TBEC
81.0000 mg | DELAYED_RELEASE_TABLET | Freq: Every day | ORAL | Status: DC
  Administered 2017-11-06 – 2017-11-11 (×6): 81 mg via ORAL
  Filled 2017-11-05 (×6): qty 1

## 2017-11-05 NOTE — ED Notes (Signed)
Rn and tech attempted to draw blood. Pt jerked arm back and will not fully extened arm. Will see if someone else can draw blood

## 2017-11-05 NOTE — Telephone Encounter (Signed)
Left message for patient to call back for appt.

## 2017-11-05 NOTE — ED Notes (Signed)
I Stat Trop I results shown to Dr. Deno Etienne

## 2017-11-05 NOTE — ED Triage Notes (Signed)
Pt from home with increasing shortness of breath and edema. Pt has CHF and thinks she has a little extra fluid on her

## 2017-11-05 NOTE — ED Provider Notes (Addendum)
[facility] EMERGENCY DEPARTMENT Provider Note   CSN: [id number] Arrival date & time: 11/05/17  1714     History   Chief Complaint Chief Complaint  Patient presents with  . Shortness of Breath    HPI Mandy Rhodes is a 75 y.o. female.  76 yo F with a chief complaint of shortness of breath.  She is also had a cough and congestion.  Going on for the past couple weeks.  Progressively getting worse.  She thinks maybe it could be her heart failure though her leg swelling is about at her baseline.  She denies any change in her medications.  Denies fevers or chills.  Denies abdominal pain vomiting or diarrhea.  Symptoms are worse when she lays back flat.  No exertional symptoms.  No hemoptysis.  No history of PE or DVT.  No recent hospitalization or surgery.  Not on estrogen.   The history is provided by the patient.  Shortness of Breath  This is a new problem. The average episode lasts 2 weeks. The problem occurs continuously.The current episode started more than 1 week ago. The problem has been gradually worsening. Associated symptoms include cough and leg swelling. Pertinent negatives include no fever, no headaches, no rhinorrhea, no wheezing, no chest pain and no vomiting. She has tried nothing for the symptoms. The treatment provided no relief. She has had prior hospitalizations. She has had prior ED visits. Associated medical issues include heart failure.    Past Medical History:  Diagnosis Date  . Anemia   . Arthritis   . CAD (coronary artery disease)    a. minimal CAD by cath in 03/2016  . CHF (congestive heart failure) ([place])    a. 09/2016: Echo with EF of 30-35%, moderate diffuse HK, Grade 2 DD, PA peak pressure of 39 mm Hg.  Marland Kitchen Chronic kidney disease    Renal Insuffiency, see [facility] once a year  . Diabetes mellitus    type 2  . GERD (gastroesophageal reflux disease)   . History of rotator cuff tear    Torn right rotator cuff.  . History of  spinal stenosis   . Hypertension   . Infiltrative cardiomyopathy ([place])    a, presumed cardiac amyloidosis by MRI with prior nondiagnostic fat pad biopsy  . Neuropathy associated with endocrine disorder [facility])     Patient Active Problem List   Diagnosis Date Noted  . Pancytopenia ([place]) 05/17/2017  . Acute on chronic diastolic CHF (congestive heart failure) ([place]) 04/03/2017  . Chronic knee pain 03/13/2017  . Mental status change resolved 11/24/2016  . UTI due to extended-spectrum beta lactamase (ESBL) producing Escherichia coli 11/22/2016  . Dysphagia 11/01/2016  . Vertebral osteomyelitis ([place])   . Laryngeal mass 10/16/2016  . Anemia 10/16/2016  . Demand ischemia ([place]) 10/16/2016  . PAT (paroxysmal atrial tachycardia) ([place]) 10/14/2016  . Malnutrition of moderate degree 10/14/2016  . Acute respiratory failure with hypoxia ([place]) 10/13/2016  . Pressure injury of skin 10/13/2016  . Acute kidney injury superimposed on CKD ([place])   . Chronic combined systolic and diastolic heart failure ([place])   . Edema 08/23/2016  . Nonischemic cardiomyopathy ([place]) 07/14/2016  . Hyperlipidemia 07/14/2016  . Cardiomegaly 03/17/2016  . CKD (chronic kidney disease), stage III ([place]) 03/17/2016  . Essential hypertension 03/17/2016  . Diabetes mellitus ([place]) 09/29/2011    Past Surgical History:  Procedure Laterality Date  . ABDOMINAL HYSTERECTOMY  1971   Partial  . ANTERIOR CERVICAL DECOMP/DISCECTOMY FUSION N/A 10/18/2016  Procedure: debridement and drainage cervical prevertebral abscess  ANTERIOR CERVICAL HARDWARE REMOVAL exploration cervical fusion;  Surgeon: Earnie Larsson, MD;  Location: [facility];  Service: Neurosurgery;  Laterality: N/A;  . APPENDECTOMY    . BACK SURGERY  2009   Disectomy  . CARDIAC CATHETERIZATION N/A 03/22/2016   Procedure: Left Heart Cath and Coronary Angiography;  Surgeon: Peter M Martinique, MD;  Location: [facility] CV LAB;  Service: Cardiovascular;  Laterality: N/A;  . CHOLECYSTECTOMY   1969   Status post   . CYST REMOVAL TRUNK Right 12/03/2014   Procedure: EXCISION OF RIGHT BACK CYST ;  Surgeon: Coralie Keens, MD;  Location: [facility];  Service: General;  Laterality: Right;  . DIRECT LARYNGOSCOPY N/A 10/17/2016   Procedure: DIRECT LARYNGOSCOPY,  ESOPHAGOSCOPY;  Surgeon: [name], MD;  Location: [facility];  Service: ENT;  Laterality: N/A;  . FOOT SURGERY Bilateral    bone removed  . IR GASTROSTOMY TUBE REMOVAL  05/03/2017  . IR GENERIC HISTORICAL  10/25/2016   IR GASTROSTOMY TUBE MOD SED 10/25/2016 [name], MD [facility]  . NEUROPLASTY / TRANSPOSITION MEDIAN NERVE AT CARPAL TUNNEL Bilateral    Hx  Left carpal tunnel repair  . TRACHEOSTOMY TUBE PLACEMENT N/A 10/17/2016   Procedure: TRACHEOSTOMY;  Surgeon: [name], MD;  Location: [facility];  Service: ENT;  Laterality: N/A;    OB History    No data available       Home Medications    Prior to Admission medications   Medication Sig Start Date End Date Taking? Authorizing Provider  acetaminophen (TYLENOL) 325 MG tablet Take 650 mg by mouth every 6 (six) hours as needed for mild pain.   Yes [provider]  aspirin 81 MG EC tablet Chew 81 mg by mouth daily.    Yes [provider]  Calcium Carb-Cholecalciferol (CALCIUM 600 + D PO) Take 1 tablet by mouth every morning.    Yes [provider]  ferrous sulfate 325 (65 FE) MG EC tablet Take 325 mg by mouth daily.    Yes [provider]  furosemide (LASIX) 40 MG tablet Take 40 mg by mouth daily.   Yes [provider]  glimepiride (AMARYL) 1 MG tablet Take 1 mg by mouth daily. 03/27/17  Yes [provider]  Glycerin-Polysorbate 80 (REFRESH DRY EYE THERAPY OP) Place 1 drop into the left eye 2 (two) times daily.   Yes [provider]  metoprolol tartrate (LOPRESSOR) 25 MG tablet Place 1 tablet (25 mg total) into feeding tube 2 (two) times daily. Patient taking differently: Take 25 mg by mouth 2 (two) times  daily.  11/05/16  Yes Debbe Odea, MD  Multiple Vitamin (TAB-A-VITE) TABS Take 1 tablet by mouth every morning.    Yes [provider]  nortriptyline (PAMELOR) 10 MG capsule Take 10 mg by mouth at bedtime.    Yes [provider]  omeprazole (PRILOSEC) 40 MG capsule 40 mg See admin instructions. Per feeding tube once a day   Yes [provider]    Family History Family History  Problem Relation Age of Onset  . Diabetes type II Mother   . Hypertension Father   . Diabetes type II Sister   . Ovarian cancer Sister   . Diabetes type II Other   . Stroke Neg Hx   . CAD Neg Hx   . Heart failure Neg Hx     Social History Social History   Tobacco Use  . Smoking status: Former  Smoker  . Smokeless tobacco: Never Used  . Tobacco comment: 12/02/13- quit over 20 years ago  Substance Use Topics  . Alcohol use: No  . Drug use: No     Allergies   Sulfa antibiotics   Review of Systems Review of Systems  Constitutional: Negative for chills and fever.  HENT: Positive for congestion. Negative for rhinorrhea.   Eyes: Negative for redness and visual disturbance.  Respiratory: Positive for cough and shortness of breath. Negative for wheezing.   Cardiovascular: Positive for leg swelling. Negative for chest pain and palpitations.  Gastrointestinal: Negative for nausea and vomiting.  Genitourinary: Negative for dysuria and urgency.  Musculoskeletal: Negative for arthralgias and myalgias.  Skin: Negative for pallor and wound.  Neurological: Negative for dizziness and headaches.     Physical Exam Updated Vital Signs BP (!) 137/95   Pulse (!) 58   Resp 16   Ht 5\' 7"  (1.702 m)   Wt 93 kg (205 lb)   SpO2 99%   BMI 32.11 kg/m   Physical Exam  Constitutional: She is oriented to person, place, and time. She appears well-developed and well-nourished. No distress.  HENT:  Head: Normocephalic and atraumatic.  Eyes: EOM are normal. Pupils are equal, round, and  reactive to light.  Neck: Normal range of motion. Neck supple.  Cardiovascular: Normal rate and regular rhythm. Exam reveals no gallop and no friction rub.  No murmur heard. Pulmonary/Chest: Effort normal. She has no wheezes. She has no rales.  Abdominal: Soft. She exhibits no distension. There is no tenderness.  Musculoskeletal: She exhibits edema (3+ up to the shins). She exhibits no tenderness.  Neurological: She is alert and oriented to person, place, and time.  Skin: Skin is warm and dry. She is not diaphoretic.  Psychiatric: She has a normal mood and affect. Her behavior is normal.  Nursing note and vitals reviewed.    ED Treatments / Results  Labs (all labs ordered are listed, but only abnormal results are displayed) Labs Reviewed  CBC WITH DIFFERENTIAL/PLATELET - Abnormal; Notable for the following components:      Result Value   WBC 3.5 (*)    RDW 16.7 (*)    Platelets 94 (*)    All other components within normal limits  BASIC METABOLIC PANEL - Abnormal; Notable for the following components:   Glucose, Bld 114 (*)    BUN 47 (*)    Creatinine, Ser 2.51 (*)    GFR calc non Af Amer 18 (*)    GFR calc Af Amer 20 (*)    All other components within normal limits  BRAIN NATRIURETIC PEPTIDE - Abnormal; Notable for the following components:   B Natriuretic Peptide 1,651.6 (*)    All other components within normal limits  I-STAT TROPONIN, ED - Abnormal; Notable for the following components:   Troponin i, poc 0.34 (*)    All other components within normal limits    EKG  EKG Interpretation  Date/Time:  Monday November 05 2017 17:26:57 EST Ventricular Rate:  62 PR Interval:    QRS Duration: 121 QT Interval:  474 QTC Calculation: 486 R Axis:   54 Text Interpretation:  Sinus rhythm Short PR interval Nonspecific intraventricular conduction delay Borderline repolarization abnormality No significant change since last tracing Confirmed by Deno Etienne [phone]) on 11/05/2017  5:44:38 PM       Radiology Dg Chest 2 View  Result Date: 11/05/2017 CLINICAL DATA:  Short of breath EXAM: CHEST  2 VIEW COMPARISON:  04/04/2017 FINDINGS: Probable moderate left pleural effusion. Cardiomegaly with vascular congestion. Atelectasis or infiltrate at the left base. No pneumothorax. IMPRESSION: 1. Cardiomegaly with vascular congestion 2. Suspected moderate left pleural effusion. Dense atelectasis or infiltrate at the left base. Electronically Signed   By: Donavan Foil M.D.   On: 11/05/2017 18:47    Procedures Procedures (including critical care time)  Medications Ordered in ED Medications  azithromycin (ZITHROMAX) 500 mg in dextrose 5 % 250 mL IVPB (500 mg Intravenous New Bag/Given 11/05/17 2039)  aspirin chewable tablet 324 mg (324 mg Oral Given 11/05/17 1942)  cefTRIAXone (ROCEPHIN) 1 g in dextrose 5 % 50 mL IVPB (1 g Intravenous New Bag/Given 11/05/17 2028)     Initial Impression / Assessment and Plan / ED Course  I have reviewed the triage vital signs and the nursing notes.  Pertinent labs & imaging results that were available during my care of the patient were reviewed by me and considered in my medical decision making (see chart for details).     76 yo F with a chief complaint of shortness of breath.  This been going on for the past week or so.  Associated with a URI like illness.  Chest x-ray is concerning for possible pneumonia.  With the patient's persistent cough and shortness of breath will treat with antibiotics.  She has a positive troponin of 0.34.  I discussed this with the cardiology fellow, Dr. Therisa Doyne, he feels this is unlikely to be cardiac in that her troponins are likely elevated due to her underlying sarcoidosis and CKD.  Will discuss with the hospitalist.  CRITICAL CARE Performed by: Cecilio Asper   Total critical care time: 35 minutes  Critical care time was exclusive of separately billable procedures and treating other  patients.  Critical care was necessary to treat or prevent imminent or life-threatening deterioration.  Critical care was time spent personally by me on the following activities: development of treatment plan with patient and/or surrogate as well as nursing, discussions with consultants, evaluation of patient's response to treatment, examination of patient, obtaining history from patient or surrogate, ordering and performing treatments and interventions, ordering and review of laboratory studies, ordering and review of radiographic studies, pulse oximetry and re-evaluation of patient's condition.   The patients results and plan were reviewed and discussed.   Any x-rays performed were independently reviewed by myself.   Differential diagnosis were considered with the presenting HPI.  Medications  azithromycin (ZITHROMAX) 500 mg in dextrose 5 % 250 mL IVPB (500 mg Intravenous New Bag/Given 11/05/17 2039)  aspirin chewable tablet 324 mg (324 mg Oral Given 11/05/17 1942)  cefTRIAXone (ROCEPHIN) 1 g in dextrose 5 % 50 mL IVPB (1 g Intravenous New Bag/Given 11/05/17 2028)    Vitals:   11/05/17 1919 11/05/17 1930 11/05/17 2015 11/05/17 2030  BP: 121/79 (!) 133/92 (!) 121/91 (!) 137/95  Pulse: 67 61 (!) 58 (!) 58  Resp: 19 15 (!) 23 16  SpO2: 97% 99% 99% 99%  Weight:      Height:        Final diagnoses:  Community acquired pneumonia of left lower lobe of lung (HCC)  Troponin level elevated    Admission/ observation were discussed with the admitting physician, patient and/or family and they are comfortable with the plan.    Final Clinical Impressions(s) / ED Diagnoses   Final diagnoses:  Community acquired pneumonia of left lower lobe of lung ([place])  Troponin level elevated    ED Discharge  Orders    None       Deno Etienne, DO 11/05/17 2114    Deno Etienne, DO 11/05/17 2131

## 2017-11-06 ENCOUNTER — Inpatient Hospital Stay (HOSPITAL_COMMUNITY): Payer: Medicare HMO

## 2017-11-06 ENCOUNTER — Encounter (HOSPITAL_COMMUNITY): Payer: Self-pay

## 2017-11-06 DIAGNOSIS — N189 Chronic kidney disease, unspecified: Secondary | ICD-10-CM

## 2017-11-06 DIAGNOSIS — R9431 Abnormal electrocardiogram [ECG] [EKG]: Secondary | ICD-10-CM | POA: Diagnosis present

## 2017-11-06 DIAGNOSIS — I10 Essential (primary) hypertension: Secondary | ICD-10-CM

## 2017-11-06 DIAGNOSIS — D696 Thrombocytopenia, unspecified: Secondary | ICD-10-CM | POA: Diagnosis present

## 2017-11-06 DIAGNOSIS — I361 Nonrheumatic tricuspid (valve) insufficiency: Secondary | ICD-10-CM

## 2017-11-06 DIAGNOSIS — I5043 Acute on chronic combined systolic (congestive) and diastolic (congestive) heart failure: Secondary | ICD-10-CM

## 2017-11-06 DIAGNOSIS — N179 Acute kidney failure, unspecified: Secondary | ICD-10-CM

## 2017-11-06 LAB — TROPONIN I
TROPONIN I: 0.35 ng/mL — AB (ref <0.03)
TROPONIN I: 0.34 ng/mL — AB (ref <0.03)

## 2017-11-06 LAB — CBC WITH DIFFERENTIAL/PLATELET
Basophils Absolute: 0 10*3/uL (ref 0.0–0.1)
Basophils Relative: 1 %
EOS ABS: 0.1 10*3/uL (ref 0.0–0.7)
Eosinophils Relative: 3 %
HEMATOCRIT: 36 % (ref 36.0–46.0)
HEMOGLOBIN: 12.4 g/dL (ref 12.0–15.0)
LYMPHS ABS: 1 10*3/uL (ref 0.7–4.0)
LYMPHS PCT: 32 %
MCH: 30.5 pg (ref 26.0–34.0)
MCHC: 34.4 g/dL (ref 30.0–36.0)
MCV: 88.7 fL (ref 78.0–100.0)
MONOS PCT: 14 %
Monocytes Absolute: 0.4 10*3/uL (ref 0.1–1.0)
NEUTROS ABS: 1.6 10*3/uL — AB (ref 1.7–7.7)
NEUTROS PCT: 51 %
Platelets: 80 10*3/uL — ABNORMAL LOW (ref 150–400)
RBC: 4.06 MIL/uL (ref 3.87–5.11)
RDW: 16.6 % — ABNORMAL HIGH (ref 11.5–15.5)
WBC: 3.1 10*3/uL — ABNORMAL LOW (ref 4.0–10.5)

## 2017-11-06 LAB — BASIC METABOLIC PANEL
Anion gap: 10 (ref 5–15)
BUN: 45 mg/dL — ABNORMAL HIGH (ref 6–20)
CHLORIDE: 109 mmol/L (ref 101–111)
CO2: 23 mmol/L (ref 22–32)
CREATININE: 2.3 mg/dL — AB (ref 0.44–1.00)
Calcium: 9.6 mg/dL (ref 8.9–10.3)
GFR calc non Af Amer: 19 mL/min — ABNORMAL LOW (ref >60)
GFR, EST AFRICAN AMERICAN: 23 mL/min — AB (ref >60)
Glucose, Bld: 98 mg/dL (ref 65–99)
POTASSIUM: 3.8 mmol/L (ref 3.5–5.1)
Sodium: 142 mmol/L (ref 135–145)

## 2017-11-06 LAB — GLUCOSE, CAPILLARY
GLUCOSE-CAPILLARY: 122 mg/dL — AB (ref 65–99)
GLUCOSE-CAPILLARY: 163 mg/dL — AB (ref 65–99)
Glucose-Capillary: 64 mg/dL — ABNORMAL LOW (ref 65–99)
Glucose-Capillary: 67 mg/dL (ref 65–99)
Glucose-Capillary: 150 mg/dL — ABNORMAL HIGH (ref 65–99)

## 2017-11-06 LAB — PROCALCITONIN: Procalcitonin: <0.1 ng/mL

## 2017-11-06 LAB — ECHOCARDIOGRAM COMPLETE
Height: 67 in
Weight: 3280 oz

## 2017-11-06 LAB — LIPID PANEL
CHOL/HDL RATIO: 2.6 ratio
CHOLESTEROL: 146 mg/dL (ref 0–200)
HDL: 56 mg/dL (ref >40)
LDL Cholesterol: 82 mg/dL (ref 0–99)
TRIGLYCERIDES: 40 mg/dL (ref <150)
VLDL: 8 mg/dL (ref 0–40)

## 2017-11-06 LAB — CBG MONITORING, ED: Glucose-Capillary: 69 mg/dL (ref 65–99)

## 2017-11-06 LAB — STREP PNEUMONIAE URINARY ANTIGEN: STREP PNEUMO URINARY ANTIGEN: POSITIVE — AB

## 2017-11-06 MED ORDER — FUROSEMIDE 10 MG/ML IJ SOLN
40.0000 mg | Freq: Two times a day (BID) | INTRAMUSCULAR | Status: DC
  Administered 2017-11-06 – 2017-11-07 (×3): 40 mg via INTRAVENOUS
  Filled 2017-11-06 (×3): qty 4

## 2017-11-06 MED ORDER — ISOSORB DINITRATE-HYDRALAZINE 20-37.5 MG PO TABS
1.0000 | ORAL_TABLET | Freq: Three times a day (TID) | ORAL | Status: DC
  Administered 2017-11-06 – 2017-11-08 (×6): 1 via ORAL
  Filled 2017-11-06 (×6): qty 1

## 2017-11-06 NOTE — Progress Notes (Signed)
  Echocardiogram 2D Echocardiogram has been performed.  Terrelle Ruffolo L Androw 11/06/2017, 1:18 PM

## 2017-11-06 NOTE — Progress Notes (Signed)
PROGRESS NOTE [facility]   Mandy Rhodes   ZYS:[id number] DOB: May 15, 1941  DOA: 11/05/2017 PCP: Hendricks Limes, MD   Brief Narrative:  Mandy Rhodes is a 76 y.o. female with medical history significant of systolic and diastolic CHF last EF noted to be 30-35% with grade 2 dFx 10/13/2016, DM type II, CKD stage III, nonobstructing CAD; who presents with complaints of progressively worsening shortness of breath for last 1 week. Upon ED evaluation CXR showed cardiomegaly with increase in vascular congestion and left pleura effusion. Also there was suspected PNA so patient was started on empiric. Cardiology consulted.   Subjective: Patient seen and examined, feeling slight better. Denies chest pain.   Assessment & Plan: Acute on Chronic combine systolic and diastolic CHF  EF 93-23% in 09/2016 Elevated BNP  ECHO pending  Continue IV diuresis, unable to use ACE/ARBs and aldactone due to CKD  Daily weight and strict I&Os  Card recommending to start bidil  Monitor Cr closely   Pneumonia Left base infiltrate on CXR Strep pneumo antigen positive  Continue Rocephin  Get procalcitonin   Elevated troponin Likely demand ischemia from CHF  Cards recommendations appreciated  Nor further work up needed   AKI on CKD stage IV - likely cardiorenal syndrome  Expect Cr to improve with Lasix  Baseline Cr 1.9 Check BMP in AM   HTN  BP stable  Continue current regimen   DM type 2  CBGs stable  Hold oral agents  Continue SSI   Goals of care  Patient does not wish to be DNR anymore, she want to be Full code - meaning of this discussed and patient understands  DVT prophylaxis: SCD Code Status: Full Code  Family Communication: None at bedside  Disposition Plan: Home when medically stable   Consultants:   Cardiology   Procedures:   ECHO - pending   Antimicrobials: Anti-infectives (From admission, onward)   Start     Dose/Rate Route Frequency Ordered Stop   11/06/17 2200  cefTRIAXone (ROCEPHIN) 1 g in dextrose 5 % 50 mL IVPB     1 g 100 mL/hr over 30 Minutes Intravenous Every 24 hours 11/05/17 2211 11/12/17 2159   11/06/17 2200  azithromycin (ZITHROMAX) 500 mg in dextrose 5 % 250 mL IVPB  Status:  Discontinued     500 mg 250 mL/hr over 60 Minutes Intravenous Every 24 hours 11/05/17 2211 11/06/17 0632   11/05/17 2015  cefTRIAXone (ROCEPHIN) 1 g in dextrose 5 % 50 mL IVPB     1 g 100 mL/hr over 30 Minutes Intravenous  Once 11/05/17 2014 11/05/17 2131   11/05/17 2015  azithromycin (ZITHROMAX) 500 mg in dextrose 5 % 250 mL IVPB     500 mg 250 mL/hr over 60 Minutes Intravenous  Once 11/05/17 2014 11/05/17 2208           Objective: Vitals:   11/06/17 0845 11/06/17 0900 11/06/17 0954 11/06/17 1534  BP: 125/72 120/72 126/69   Pulse: 67 63 62   Resp: 19 15 18    Temp:   (!) 97.5 F (36.4 C)   TempSrc:   Oral   SpO2: 93% 94% 95%   Weight:    105.2 kg (231 lb 14.8 oz)  Height:   5\' 7"  (1.702 m)     Intake/Output Summary (Last 24 hours) at 11/06/2017 1923 Last data filed at 11/06/2017 1530 Gross per 24 hour  Intake 540 ml  Output 600 ml  Net -60 ml   Filed  Weights   11/05/17 1729 11/06/17 1534  Weight: 93 kg (205 lb) 105.2 kg (231 lb 14.8 oz)    Examination:  General exam: Appears calm and comfortable  HEENT: OP moist and clear Respiratory system: Breath sound diminished, + JVD, bibasilar crackles and rales in LLL  Cardiovascular system: S1 & S2 heard, RRR. No JVD, murmurs, rubs or gallops Gastrointestinal system: Abdomen is nondistended, soft and nontender. Normal bowel sounds heard. Central nervous system: Alert and oriented. No focal neurological deficits. Extremities: 2+ edema bilaterally   Skin: No rashes Psychiatry: Mood & affect appropriate.   Data Reviewed: I have personally reviewed following labs and imaging studies  CBC: Recent Labs  Lab 11/05/17 1916 11/06/17 0132  WBC 3.5* 3.1*  NEUTROABS 2.0 1.6*  HGB  13.0 12.4  HCT 37.9 36.0  MCV 89.0 88.7  PLT 94* 80*   Basic Metabolic Panel: Recent Labs  Lab 11/05/17 1916 11/06/17 0132  NA 143 142  K 3.9 3.8  CL 108 109  CO2 26 23  GLUCOSE 114* 98  BUN 47* 45*  CREATININE 2.51* 2.30*  CALCIUM 10.0 9.6   GFR: Estimated Creatinine Clearance: 26 mL/min (A) (by C-G formula based on SCr of 2.3 mg/dL (H)). Liver Function Tests: No results for input(s): AST, ALT, ALKPHOS, BILITOT, PROT, ALBUMIN in the last 168 hours. No results for input(s): LIPASE, AMYLASE in the last 168 hours. No results for input(s): AMMONIA in the last 168 hours. Coagulation Profile: No results for input(s): INR, PROTIME in the last 168 hours. Cardiac Enzymes: Recent Labs  Lab 11/05/17 2210 11/06/17 0132 11/06/17 0322  TROPONINI 0.37* 0.35* 0.34*   BNP (last 3 results) No results for input(s): PROBNP in the last 8760 hours. HbA1C: No results for input(s): HGBA1C in the last 72 hours. CBG: Recent Labs  Lab 11/06/17 0754 11/06/17 1000 11/06/17 1151 11/06/17 1342 11/06/17 1702  GLUCAP 69 64* 67 122* 163*   Lipid Profile: Recent Labs    11/06/17 0322  CHOL 146  HDL 56  LDLCALC 82  TRIG 40  CHOLHDL 2.6   Thyroid Function Tests: No results for input(s): TSH, T4TOTAL, FREET4, T3FREE, THYROIDAB in the last 72 hours. Anemia Panel: No results for input(s): VITAMINB12, FOLATE, FERRITIN, TIBC, IRON, RETICCTPCT in the last 72 hours. Sepsis Labs: No results for input(s): PROCALCITON, LATICACIDVEN in the last 168 hours.  Recent Results (from the past 240 hour(s))  Culture, blood (routine x 2) Call MD if unable to obtain prior to antibiotics being given     Status: None (Preliminary result)   Collection Time: 11/05/17 10:31 PM  Result Value Ref Range Status   Specimen Description BLOOD LEFT ANTECUBITAL  Final   Special Requests   Final    BOTTLES DRAWN AEROBIC AND ANAEROBIC Blood Culture adequate volume   Culture PENDING  Incomplete   Report Status  PENDING  Incomplete     Radiology Studies: Dg Chest 2 View  Result Date: 11/05/2017 CLINICAL DATA:  Short of breath EXAM: CHEST  2 VIEW COMPARISON:  04/04/2017 FINDINGS: Probable moderate left pleural effusion. Cardiomegaly with vascular congestion. Atelectasis or infiltrate at the left base. No pneumothorax. IMPRESSION: 1. Cardiomegaly with vascular congestion 2. Suspected moderate left pleural effusion. Dense atelectasis or infiltrate at the left base. Electronically Signed   By: Donavan Foil M.D.   On: 11/05/2017 18:47    Scheduled Meds: . aspirin EC  81 mg Oral Daily  . ferrous sulfate  325 mg Oral Daily  . furosemide  40  mg Intravenous BID  . insulin aspart  0-9 Units Subcutaneous TID WC  . isosorbide-hydrALAZINE  1 tablet Oral TID  . metoprolol tartrate  25 mg Oral BID  . multivitamin with minerals  1 tablet Oral q morning - 10a  . sodium chloride flush  3 mL Intravenous Q12H   Continuous Infusions: . sodium chloride    . cefTRIAXone (ROCEPHIN)  IV       LOS: 1 day    Time spent: Total of 25 minutes spent with pt, greater than 50% of which was spent in discussion of  treatment, counseling and coordination of care   Chipper Oman, MD Pager: Text Page via [email or web address]   If 7PM-7AM, please contact night-coverage [email or web address] 11/06/2017, 7:23 PM

## 2017-11-06 NOTE — ED Notes (Signed)
Attempted to call report x 1 to 3 [place].

## 2017-11-06 NOTE — H&P (Signed)
History and Physical    Mandy Rhodes QQV:[id number] DOB: 27-Dec-1940 DOA: 11/05/2017  Referring MD/NP/PA: Dr. Deno Rhodes PCP: Mandy Limes, MD  Patient coming from:  Chief Complaint: Shortness of breath  I have personally briefly reviewed patient's old medical records in [place]  HPI: Mandy Rhodes is a 76 y.o. female with medical history significant of systolic and diastolic CHF last EF noted to be 30-35% with grade 2 dFx 10/13/2016, DM type II, CKD stage III, nonobstructing CAD; who presents with complaints of progressively worsening shortness of breath for last 1 week.  She also complains of having chest congestion and a nonproductive cough.  Patient initially thought it might be her heart failure as she complained of some lower leg swelling.  She does not routinely check her l weight at home as she does not have a scale.  Associated symptoms included intermittent wheezing, orthopnea reporting need to raise head of her hospital bed, insomnia, recent sick contacts of her friend with a "cold", and chest tightness.  Patient reports having 1 loose bowel movement the other day, but that has since resolved.  Denies having any fever, chills, nausea, vomiting, abdominal pain, nausea, vomiting, calf pain, or prolonged immobilization.  ED Course: Upon admission into the emergency department no initial temperature was taken, pulse 58-67, respirations 15-25, blood pressure 101/86-130 7/95, O2 saturations 95-100% on room air.  Labs revealed WBC 3.5, platelets 94, BUN 47, creatinine 2.51, BNP 1651, and troponin 0.34.  Chest x-ray showed cardiomegaly with vascular congestion and a left-sided pleural effusion.  Cardiology was notified, but recommended continued monitoring at this time.  Due to suspected pneumonia patient was started on empiric antibiotics of ceftriaxone and azithromycin.  Review of Systems  Constitutional: Negative for chills, fever and weight loss.  HENT: Negative for ear  discharge and nosebleeds.   Eyes: Negative for photophobia and pain.  Respiratory: Positive for cough, shortness of breath and wheezing. Negative for hemoptysis and sputum production.   Cardiovascular: Positive for orthopnea and leg swelling. Negative for chest pain.  Gastrointestinal: Negative for abdominal pain, nausea and vomiting.  Genitourinary: Negative for dysuria and hematuria.  Musculoskeletal: Negative for falls and myalgias.  Skin: Negative for itching and rash.  Neurological: Negative for sensory change, focal weakness and headaches.  Psychiatric/Behavioral: Negative for hallucinations and substance abuse.    Past Medical History:  Diagnosis Date  . Anemia   . Arthritis   . CAD (coronary artery disease)    a. minimal CAD by cath in 03/2016  . CHF (congestive heart failure) ([place])    a. 09/2016: Echo with EF of 30-35%, moderate diffuse HK, Grade 2 DD, PA peak pressure of 39 mm Hg.  Marland Kitchen Chronic kidney disease    Renal Insuffiency, see [facility] once a year  . Diabetes mellitus    type 2  . GERD (gastroesophageal reflux disease)   . History of rotator cuff tear    Torn right rotator cuff.  . History of spinal stenosis   . Hypertension   . Infiltrative cardiomyopathy ([place])    a, presumed cardiac amyloidosis by MRI with prior nondiagnostic fat pad biopsy  . Neuropathy associated with endocrine disorder [facility])     Past Surgical History:  Procedure Laterality Date  . ABDOMINAL HYSTERECTOMY  1971   Partial  . ANTERIOR CERVICAL DECOMP/DISCECTOMY FUSION N/A 10/18/2016   Procedure: debridement and drainage cervical prevertebral abscess  ANTERIOR CERVICAL HARDWARE REMOVAL exploration cervical fusion;  Surgeon: Earnie Larsson, MD;  Location: [place] OR;  Service: Neurosurgery;  Laterality: N/A;  . APPENDECTOMY    . BACK SURGERY  2009   Disectomy  . CARDIAC CATHETERIZATION N/A 03/22/2016   Procedure: Left Heart Cath and Coronary Angiography;  Surgeon: Peter M Martinique, MD;  Location:  [place] [facility] LAB;  Service: Cardiovascular;  Laterality: N/A;  . CHOLECYSTECTOMY  1969   Status post   . CYST REMOVAL TRUNK Right 12/03/2014   Procedure: EXCISION OF RIGHT BACK CYST ;  Surgeon: Coralie Keens, MD;  Location: [place];  Service: General;  Laterality: Right;  . DIRECT LARYNGOSCOPY N/A 10/17/2016   Procedure: DIRECT LARYNGOSCOPY,  ESOPHAGOSCOPY;  Surgeon: [name], MD;  Location: [facility];  Service: ENT;  Laterality: N/A;  . FOOT SURGERY Bilateral    bone removed  . IR GASTROSTOMY TUBE REMOVAL  05/03/2017  . IR GENERIC HISTORICAL  10/25/2016   IR GASTROSTOMY TUBE MOD SED 10/25/2016 [name], MD [facility]  . NEUROPLASTY / TRANSPOSITION MEDIAN NERVE AT CARPAL TUNNEL Bilateral    Hx  Left carpal tunnel repair  . TRACHEOSTOMY TUBE PLACEMENT N/A 10/17/2016   Procedure: TRACHEOSTOMY;  Surgeon: [name], MD;  Location: [facility];  Service: ENT;  Laterality: N/A;     reports that she has quit smoking. she has never used smokeless tobacco. She reports that she does not drink alcohol or use drugs.  Allergies  Allergen Reactions  . Sulfa Antibiotics Swelling    Family History  Problem Relation Age of Onset  . Diabetes type II Mother   . Hypertension Father   . Diabetes type II Sister   . Ovarian cancer Sister   . Diabetes type II Other   . Stroke Neg Hx   . CAD Neg Hx   . Heart failure Neg Hx     Prior to Admission medications   Medication Sig Start Date End Date Taking? Authorizing Provider  acetaminophen (TYLENOL) 325 MG tablet Take 650 mg by mouth every 6 (six) hours as needed for mild pain.   Yes [provider]  aspirin 81 MG EC tablet Chew 81 mg by mouth daily.    Yes [provider]  Calcium Carb-Cholecalciferol (CALCIUM 600 + D PO) Take 1 tablet by mouth every morning.    Yes [provider]  ferrous sulfate 325 (65 FE) MG EC tablet Take 325 mg by mouth daily.    Yes [provider]  furosemide (LASIX) 40 MG  tablet Take 40 mg by mouth daily.   Yes [provider]  glimepiride (AMARYL) 1 MG tablet Take 1 mg by mouth daily. 03/27/17  Yes [provider]  Glycerin-Polysorbate 80 (REFRESH DRY EYE THERAPY OP) Place 1 drop into the left eye 2 (two) times daily.   Yes [provider]  metoprolol tartrate (LOPRESSOR) 25 MG tablet Place 1 tablet (25 mg total) into feeding tube 2 (two) times daily. Patient taking differently: Take 25 mg by mouth 2 (two) times daily.  11/05/16  Yes Debbe Odea, MD  Multiple Vitamin (TAB-A-VITE) TABS Take 1 tablet by mouth every morning.    Yes [provider]  nortriptyline (PAMELOR) 10 MG capsule Take 10 mg by mouth at bedtime.    Yes [provider]  omeprazole (PRILOSEC) 40 MG capsule 40 mg See admin instructions. Per feeding tube once a day   Yes [provider]    Physical Exam:  Constitutional: Elderly female in NAD, calm, comfortable Vitals:   11/06/17 0445 11/06/17 0500 11/06/17  0515 11/06/17 0545  BP: 113/67 (!) 119/101 104/65 103/62  Pulse: 61 60 (!) 58 62  Resp: 16 13 18 18   Temp:      TempSrc:      SpO2: 93% 97% 94% 96%  Weight:      Height:       Eyes: PERRL, lids and conjunctivae normal ENMT: Mucous membranes are moist. Posterior pharynx clear of any exudate or lesions. Neck: normal, supple, no masses, no thyromegaly. +JVD Respiratory: Mildly tachypneic with decreased aeration noted at the left lower lung field with rales present.  Patient able to talk in complete sentences. Cardiovascular: Regular rate and rhythm, no murmurs / rubs / gallops.  2+ pitting lower extremity edema. 2+ pedal pulses. No carotid bruits.  Abdomen: no tenderness, no masses palpated. No hepatosplenomegaly. Bowel sounds positive.  Musculoskeletal:   No joint deformity upper and lower extremities. Good ROM, no contractures. Normal muscle tone.  Skin: no rashes, lesions, ulcers. No induration Neurologic: CN 2-12 grossly intact.  Sensation intact, DTR normal. Strength 5/5 in all 4.  Psychiatric: Normal judgment and insight. Alert and oriented x 3. Normal mood.     Labs on Admission: I have personally reviewed following labs and imaging studies  CBC: Recent Labs  Lab 11/05/17 1916 11/06/17 0132  WBC 3.5* 3.1*  NEUTROABS 2.0 1.6*  HGB 13.0 12.4  HCT 37.9 36.0  MCV 89.0 88.7  PLT 94* 80*   Basic Metabolic Panel: Recent Labs  Lab 11/05/17 1916 11/06/17 0132  NA 143 142  K 3.9 3.8  CL 108 109  CO2 26 23  GLUCOSE 114* 98  BUN 47* 45*  CREATININE 2.51* 2.30*  CALCIUM 10.0 9.6   GFR: Estimated Creatinine Clearance: 24.4 mL/min (A) (by C-G formula based on SCr of 2.3 mg/dL (H)). Liver Function Tests: No results for input(s): AST, ALT, ALKPHOS, BILITOT, PROT, ALBUMIN in the last 168 hours. No results for input(s): LIPASE, AMYLASE in the last 168 hours. No results for input(s): AMMONIA in the last 168 hours. Coagulation Profile: No results for input(s): INR, PROTIME in the last 168 hours. Cardiac Enzymes: Recent Labs  Lab 11/05/17 2210 11/06/17 0132 11/06/17 0322  TROPONINI 0.37* 0.35* 0.34*   BNP (last 3 results) No results for input(s): PROBNP in the last 8760 hours. HbA1C: No results for input(s): HGBA1C in the last 72 hours. CBG: No results for input(s): GLUCAP in the last 168 hours. Lipid Profile: Recent Labs    11/06/17 0322  CHOL 146  HDL 56  LDLCALC 82  TRIG 40  CHOLHDL 2.6   Thyroid Function Tests: No results for input(s): TSH, T4TOTAL, FREET4, T3FREE, THYROIDAB in the last 72 hours. Anemia Panel: No results for input(s): VITAMINB12, FOLATE, FERRITIN, TIBC, IRON, RETICCTPCT in the last 72 hours. Urine analysis:    Component Value Date/Time   COLORURINE YELLOW 11/17/2016 1503   APPEARANCEUR CLOUDY (A) 11/17/2016 1503   LABSPEC 1.015 11/17/2016 1503   LABSPEC 1.020 12/22/2010 1142   PHURINE 8.5 (H) 11/17/2016 1503   GLUCOSEU NEGATIVE 11/17/2016 1503   HGBUR LARGE (A)  11/17/2016 1503   BILIRUBINUR NEGATIVE 11/17/2016 1503   BILIRUBINUR Negative 12/22/2010 1142   KETONESUR NEGATIVE 11/17/2016 1503   PROTEINUR 30 (A) 11/17/2016 1503   UROBILINOGEN 0.2 07/15/2015 1349   NITRITE NEGATIVE 11/17/2016 1503   LEUKOCYTESUR MODERATE (A) 11/17/2016 1503   LEUKOCYTESUR Negative 12/22/2010 1142   Sepsis Labs: Recent Results (from the past 240 hour(s))  Culture, blood (routine x 2) Call MD if  unable to obtain prior to antibiotics being given     Status: None (Preliminary result)   Collection Time: 11/05/17 10:31 PM  Result Value Ref Range Status   Specimen Description BLOOD LEFT ANTECUBITAL  Final   Special Requests   Final    BOTTLES DRAWN AEROBIC AND ANAEROBIC Blood Culture adequate volume   Culture PENDING  Incomplete   Report Status PENDING  Incomplete     Radiological Exams on Admission: Dg Chest 2 View  Result Date: 11/05/2017 CLINICAL DATA:  Short of breath EXAM: CHEST  2 VIEW COMPARISON:  04/04/2017 FINDINGS: Probable moderate left pleural effusion. Cardiomegaly with vascular congestion. Atelectasis or infiltrate at the left base. No pneumothorax. IMPRESSION: 1. Cardiomegaly with vascular congestion 2. Suspected moderate left pleural effusion. Dense atelectasis or infiltrate at the left base. Electronically Signed   By: Donavan Foil M.D.   On: 11/05/2017 18:47    EKG: Independently reviewed.  Sinus rhythm with PVC and prolonged QTC 600  Assessment/Plan Combined systolic and diastolic CHF exacerbation: Acute.  Patient presents with complaints of shortness of breath. - Admit to a telemetry bed - Heart failure orders set  initiated  - Continuous pulse oximetry with nasal cannula oxygen as needed to keep O2 saturations >92% - Strict I&Os and daily weights - Elevate lower extremities - Lasix 40 mg IV Bid - Reassess in a.m. and adjust diuresis as needed. - Check echocardiogram - Optimize medical management as able - Message sent for cardiology to  eval in a.m.  NSTEMI: Acute.  Patient presents with elevated troponin of 0.34 on admission.  Denies any active chest pain complaints at this time.  Patient was given 325 mg of aspirin to chew in the ED.  Cardiology recommended continued trending of troponins. - Trend cardiac troponins  - Follow-up echocardiogram  Possible community-acquired pneumonia: Patient reports having a cough and recent sick contact exposure. - Focus pneumonia order set initiated - Follow-up sputum and blood cultures - Continue empiric antibiotics of ceftriaxone and azithromycin, de-escalate when medically appropriate  Prolonged QT-C of 600: Overnight patient noted to have prolonged QTC up to 600. - Discontinued azithromycin, nortriptyline, and Zofran - Repeat EKG in a.m.  Acute kidney injury on chronic kidney disease stage III: Patient presents with a creatinine of 2.51 and BUN 41 on admission.  Baseline creatinine appears to be around 2.  Suspect that this i could be due to poor perfusion related to heart failure. - Recheck BMP in a.m.  Essential hypertension - Continue metoprolol  Diabetes mellitus type 2 - Hypoglycemic protocol - Hold Amaryl - CBGs q. before meals and at bedtime with sensitive sliding scale insulin  Leukopenia: WBC 3.5 on admission. - Recheck CBC in a.m.  Thrombocytopenia: Chronic. - Repeat platelet count in a.m.  DVT prophylaxis: SCDs Code Status: DNR Family Communication: None Disposition Plan: TBD Consults called: none Admission status: Obs  Norval Morton MD [facility] Pager [phone]   If 7PM-7AM, please contact night-coverage [email or web address] Password [facility]  11/06/2017, 6:25 AM

## 2017-11-06 NOTE — Consult Note (Signed)
Cardiology Consultation:   Patient ID: Mandy Rhodes; [id number]; 31-Oct-1941   Admit date: 11/05/2017 Date of Consult: 11/06/2017  Primary Care Provider: Hendricks Limes, MD Primary Cardiologist: Dr. Debara Pickett Primary Electrophysiologist: NA  Patient Profile:   Mandy Rhodes is a 76 y.o. female with a hx of combined systolic and diastolic CHF (EF at 11-02% per echo 09/2016), nonischemic cardiomyopathy (per cath 03/2016 with 20% prox LAD), HTN, HLD, DM II, CKD, and CAD who is being seen today for the evaluation of increasing SOB at the request of Dr. Tamala Julian.   History of Present Illness:   [name] is a pleasant 76yo F who presents to the ED 11/05/17 after several weeks on increasing SOB, orthopnea, wheezing, and LE swelling. She has been dyspneic with rest and exertion and reports chest "tightness" that is intermittent in nature which also began with her other presenting symptoms. She rates the chest tightness as a 7/10 in severity which is relieved with resting and when her breathing "feels better". She reports that she has taken one PRN dose of lasix 20mg  (total of 60mg  daily ) over the last two weeks with intermittent relief. She admits to mild LE edema, however her weight remains at her baseline of 205lb (05/2017 office visit weight was 206lb). She has a known hx of combined systolic and diastolic CHF and mild CAD.  She has had several CHF exacerbations with hospitalizations over the last 1 year. She was most recently seen by Dr. Debara Pickett in the office 06/11/17 for a follow-up from an exacerbation in 03/2017. Her diuretic was re-adjusted at that time to her pre-hospital dose of 40mg  PO daily based on her improved symptoms and renal function. Prior to that, she had been on Lasix 60 mg PO daily. Her creatinine is 2.30 today. Based on her trend, this seems slightly elevated from her baseline (2.30>2.51>2.0>1.9>1.92). She reports an upcoming appointment with nephrology in Jan 2019.   She has  been evaluated in the past for CAD with a cardiac cath in May 2017. This showed mild LAD disease and overall non-occlusive CAD. It was recommended to treat her medically. Her last echo showed an EF of 30-35%. There is an echo currently pending for comparison.   In the ED, she was given a total of 80mg  Lasix IV and reports that her symptoms have mildly alleviated. She also received 324mg  ASA. CXR revealed probable moderate left pleural effusion, cardiomegaly with vascular congestion and infiltrate at the left lung base.   Her troponin levels have been elevated at 0.37>0.35>0.34. Her EKG is unremarkable.    Per Dr. Lysbeth Penner note from the office in July 2017, it was noted that we may need to consider cardiac MRI  for coronary re-evaluation in the future.   Pt denies recent illness, fever, chills, cough or congestion. She does report one loose stool, however that has now resolved. She denies N/V diaphoresis, radiating chest pain, numbness or tingling in the arms or hands.   Past Medical History:  Diagnosis Date  . Anemia   . Arthritis   . CAD (coronary artery disease)    a. minimal CAD by cath in 03/2016  . CHF (congestive heart failure) ([place])    a. 09/2016: Echo with EF of 30-35%, moderate diffuse HK, Grade 2 DD, PA peak pressure of 39 mm Hg.  Marland Kitchen Chronic kidney disease    Renal Insuffiency, see [facility] once a year  . Diabetes mellitus    type 2  . GERD (  gastroesophageal reflux disease)   . History of rotator cuff tear    Torn right rotator cuff.  . History of spinal stenosis   . Hypertension   . Infiltrative cardiomyopathy ([place])    a, presumed cardiac amyloidosis by MRI with prior nondiagnostic fat pad biopsy  . Neuropathy associated with endocrine disorder [facility])     Past Surgical History:  Procedure Laterality Date  . ABDOMINAL HYSTERECTOMY  1971   Partial  . ANTERIOR CERVICAL DECOMP/DISCECTOMY FUSION N/A 10/18/2016   Procedure: debridement and drainage cervical prevertebral  abscess  ANTERIOR CERVICAL HARDWARE REMOVAL exploration cervical fusion;  Surgeon: Earnie Larsson, MD;  Location: [facility];  Service: Neurosurgery;  Laterality: N/A;  . APPENDECTOMY    . BACK SURGERY  2009   Disectomy  . CARDIAC CATHETERIZATION N/A 03/22/2016   Procedure: Left Heart Cath and Coronary Angiography;  Surgeon: Debi Cousin M Martinique, MD;  Location: [facility] CV [facility];  Service: Cardiovascular;  Laterality: N/A;  . CHOLECYSTECTOMY  1969   Status post   . CYST REMOVAL TRUNK Right 12/03/2014   Procedure: EXCISION OF RIGHT BACK CYST ;  Surgeon: Coralie Keens, MD;  Location: [facility];  Service: General;  Laterality: Right;  . DIRECT LARYNGOSCOPY N/A 10/17/2016   Procedure: DIRECT LARYNGOSCOPY,  ESOPHAGOSCOPY;  Surgeon: [name], MD;  Location: [facility];  Service: ENT;  Laterality: N/A;  . FOOT SURGERY Bilateral    bone removed  . IR GASTROSTOMY TUBE REMOVAL  05/03/2017  . IR GENERIC HISTORICAL  10/25/2016   IR GASTROSTOMY TUBE MOD SED 10/25/2016 [name], MD [facility]-INTERV RAD  . NEUROPLASTY / TRANSPOSITION MEDIAN NERVE AT CARPAL TUNNEL Bilateral    Hx  Left carpal tunnel repair  . TRACHEOSTOMY TUBE PLACEMENT N/A 10/17/2016   Procedure: TRACHEOSTOMY;  Surgeon: [name], MD;  Location: [facility];  Service: ENT;  Laterality: N/A;     Prior to Admission medications   Medication Sig Start Date End Date Taking? Authorizing Provider  acetaminophen (TYLENOL) 325 MG tablet Take 650 mg by mouth every 6 (six) hours as needed for mild pain.   Yes [provider]  aspirin 81 MG EC tablet Chew 81 mg by mouth daily.    Yes [provider]  Calcium Carb-Cholecalciferol (CALCIUM 600 + D PO) Take 1 tablet by mouth every morning.    Yes [provider]  ferrous sulfate 325 (65 FE) MG EC tablet Take 325 mg by mouth daily.    Yes [provider]  furosemide (LASIX) 40 MG tablet Take 40 mg by mouth daily.   Yes [provider]  glimepiride (AMARYL) 1 MG tablet Take 1  mg by mouth daily. 03/27/17  Yes [provider]  Glycerin-Polysorbate 80 (REFRESH DRY EYE THERAPY OP) Place 1 drop into the left eye 2 (two) times daily.   Yes [provider]  metoprolol tartrate (LOPRESSOR) 25 MG tablet Place 1 tablet (25 mg total) into feeding tube 2 (two) times daily. Patient taking differently: Take 25 mg by mouth 2 (two) times daily.  11/05/16  Yes Debbe Odea, MD  Multiple Vitamin (TAB-A-VITE) TABS Take 1 tablet by mouth every morning.    Yes [provider]  nortriptyline (PAMELOR) 10 MG capsule Take 10 mg by mouth at bedtime.    Yes [provider]  omeprazole (PRILOSEC) 40 MG capsule 40 mg See admin instructions. Per feeding tube once a day   Yes [provider]    Inpatient Medications: Scheduled Meds: . aspirin EC  81 mg Oral Daily  . ferrous sulfate  325 mg Oral Daily  . furosemide  40 mg Intravenous BID  . insulin aspart  0-9 Units Subcutaneous TID WC  . metoprolol tartrate  25 mg Oral BID  . multivitamin with minerals  1 tablet Oral q morning - 10a  . sodium chloride flush  3 mL Intravenous Q12H   Continuous Infusions: . sodium chloride    . cefTRIAXone (ROCEPHIN)  IV     PRN Meds: sodium chloride, acetaminophen, sodium chloride flush  Allergies:    Allergies  Allergen Reactions  . Sulfa Antibiotics Swelling    Social History:   Social History   Socioeconomic History  . Marital status: Divorced    Spouse name: Not on file  . Number of children: Not on file  . Years of education: Not on file  . Highest education level: Not on file  Social Needs  . Financial resource strain: Not on file  . Food insecurity - worry: Not on file  . Food insecurity - inability: Not on file  . Transportation needs - medical: Not on file  . Transportation needs - non-medical: Not on file  Occupational History  . Occupation: retired  Tobacco Use  . Smoking status: Former Research scientist (life sciences)  . Smokeless tobacco: Never Used  .  Tobacco comment: 12/02/13- quit over 20 years ago  Substance and Sexual Activity  . Alcohol use: No  . Drug use: No  . Sexual activity: Not Currently  Other Topics Concern  . Not on file  Social History Narrative  . Not on file    Family History:   Family History  Problem Relation Age of Onset  . Diabetes type II Mother   . Hypertension Father   . Diabetes type II Sister   . Ovarian cancer Sister   . Diabetes type II Other   . Stroke Neg Hx   . CAD Neg Hx   . Heart failure Neg Hx    Family Status:  Family Status  Relation Name Status  . Mother  Alive  . Father  Deceased  . Sister  Deceased  . Other  (Not Specified)  . Neg Hx  (Not Specified)    ROS:  Please see the history of present illness.  All other ROS reviewed and negative.     Physical Exam/Data:   Vitals:   11/06/17 0800 11/06/17 0830 11/06/17 0845 11/06/17 0900  BP: 117/74 109/71 125/72 120/72  Pulse: 64 61 67 63  Resp: 16 (!) 21 19 15   Temp:      TempSrc:      SpO2: 93% 95% 93% 94%  Weight:      Height:        Intake/Output Summary (Last 24 hours) at 11/06/2017 0928 Last data filed at 11/05/2017 2208 Gross per 24 hour  Intake 300 ml  Output -  Net 300 ml   Filed Weights   11/05/17 1729  Weight: 205 lb (93 kg)   Body mass index is 32.11 kg/m.   General: Well developed, well nourished, NAD Skin: Warm, dry, intact  Head: Normocephalic, atraumatic, clear, moist mucus membranes. Neck: Negative for carotid bruits. No JVD Lungs:Clear to ausculation bilaterally. No wheezes, rales, or rhonchi. Breathing is unlabored. Cardiovascular: RRR with S1 S2. No murmurs, rubs, or gallops Abdomen: Soft, non-tender, non-distended with normoactive bowel sounds. No obvious abdominal masses. MSK: Strength and tone appear normal for age. 5/5 in all extremities Extremities: 2+ pitting edema bilateral LE. No clubbing  or cyanosis. DP/PT pulses 2+ bilaterally Neuro: Alert and oriented. No focal deficits. No facial  asymmetry. MAE spontaneously. Psych: Responds to questions appropriately with normal affect.    EKG:  The EKG was personally reviewed and demonstrates: NSR with occasional PVC 11/06/17 Telemetry:  Telemetry was personally reviewed and demonstrates:  NSR 11/06/17  Relevant CV Studies:  ECHO: 10/13/2016  Study Conclusions  - Left ventricle: The cavity size was normal. There was moderate   concentric hypertrophy. The appearance was consistent with   hypertensive changes or infiltrative cardiomyopathy, such as   amyloidosis. Systolic function was moderately to severely   reduced. The estimated ejection fraction was in the range of 30%   to 35%. Moderate diffuse hypokinesis with no identifiable   regional variations. Features are consistent with a pseudonormal   left ventricular filling pattern, with concomitant abnormal   relaxation and increased filling pressure (grade 2 diastolic   dysfunction). - Mitral valve: Valve area by pressure half-time: 2.42 cm^2. - Left atrium: The atrium was moderately dilated. - Pulmonary arteries: Systolic pressure was mildly increased. PA   peak pressure: 39 mm Hg (S).  CATH: 05/03/207  Prox LAD lesion, 20% stenosed.  Ost Ramus to Ramus lesion, 10% stenosed.   Minor nonobstructive CAD  Plan: medical management.   Laboratory Data:  Chemistry Recent Labs  Lab 11/05/17 1916 11/06/17 0132  NA 143 142  K 3.9 3.8  CL 108 109  CO2 26 23  GLUCOSE 114* 98  BUN 47* 45*  CREATININE 2.51* 2.30*  CALCIUM 10.0 9.6  GFRNONAA 18* 19*  GFRAA 20* 23*  ANIONGAP 9 10    Total Protein  Date Value Ref Range Status  04/03/2017 6.9 6.5 - 8.1 g/dL Final  01/13/2015 7.0 6.4 - 8.3 g/dL Final   Albumin  Date Value Ref Range Status  04/03/2017 3.3 (L) 3.5 - 5.0 g/dL Final  01/13/2015 3.7 3.5 - 5.0 g/dL Final   AST  Date Value Ref Range Status  05/15/2017 23 13 - 35 Final  01/13/2015 17 5 - 34 U/L Final   ALT  Date Value Ref Range Status    05/15/2017 22 7 - 35 Final  01/13/2015 13 0 - 55 U/L Final   Alkaline Phosphatase  Date Value Ref Range Status  05/15/2017 130 (A) 25 - 125 Final  01/13/2015 74 40 - 150 U/L Final   Total Bilirubin  Date Value Ref Range Status  04/03/2017 0.7 0.3 - 1.2 mg/dL Final  01/13/2015 0.45 0.20 - 1.20 mg/dL Final   Hematology Recent Labs  Lab 11/05/17 1916 11/06/17 0132  WBC 3.5* 3.1*  RBC 4.26 4.06  HGB 13.0 12.4  HCT 37.9 36.0  MCV 89.0 88.7  MCH 30.5 30.5  MCHC 34.3 34.4  RDW 16.7* 16.6*  PLT 94* 80*   Cardiac Enzymes Recent Labs  Lab 11/05/17 2210 11/06/17 0132 11/06/17 0322  TROPONINI 0.37* 0.35* 0.34*    Recent Labs  Lab 11/05/17 1922  TROPIPOC 0.34*    BNP Recent Labs  Lab 11/05/17 1916  BNP 1,651.6*    DDimer No results for input(s): DDIMER in the last 168 hours. TSH:  Lab Results  Component Value Date   TSH 2.83 05/15/2017   Lipids: Lab Results  Component Value Date   CHOL 146 11/06/2017   HDL 56 11/06/2017   LDLCALC 82 11/06/2017   TRIG 40 11/06/2017   CHOLHDL 2.6 11/06/2017   HgbA1c: Lab Results  Component Value Date   HGBA1C 6.4 04/03/2017  Radiology/Studies:  Dg Chest 2 View  Result Date: 11/05/2017 CLINICAL DATA:  Short of breath EXAM: CHEST  2 VIEW COMPARISON:  04/04/2017 FINDINGS: Probable moderate left pleural effusion. Cardiomegaly with vascular congestion. Atelectasis or infiltrate at the left base. No pneumothorax. IMPRESSION: 1. Cardiomegaly with vascular congestion 2. Suspected moderate left pleural effusion. Dense atelectasis or infiltrate at the left base. Electronically Signed   By: Donavan Foil M.D.   On: 11/05/2017 18:47    Assessment and Plan:   1. Chronic combined systolic and diastolic CHF: -EF 44-96% in 09/2016 -Echo pending today  -Baseline weight remains stable at 205lb -Pt appears slight volume overloaded, however given her worsening renal function, this will be tricky to manage.  -She was previously on  60mg  PO daily, however this was decreased 05/2017 to 40mg  PO daily -Will start Lasix 40mg  IV BID -Monitor renal function closely  -If adequate diuresis and renal function remains stable, will potentially follow with HF team as outpatient. If worsening function or failure to diurese, will have HF team to see as an inpatient.   -Strict I&O and daily weights  2. Nonischemic cardiomyopathy: -Known EF 30-35% per Echo -Cardiac cath in 03/2016 with non-obstructive CAD -Medically manage  -Continue ASA, BB  3. HTN: -BP stable,126/69 -ASA, BB  4. Stage 3 CKD: -Cr trending up, now 2.30 ( was 1.92>1.9>2.0>2.51) -Will need to closely watch given the need to balance diuresing and kidney function.  -Suspect from systolic/diastolic HF   Principal Problem:   Combined systolic and diastolic congestive heart failure (HCC) Active Problems:   Diabetes mellitus ([place])   Essential hypertension   Acute kidney injury superimposed on chronic kidney disease (HCC)   Leukopenia   Thrombocytopenia (HCC)   Prolonged Q-T interval on ECG   For questions or updates, please contact [facility] Please consult [email or web address] for contact info under Cardiology/STEMI.   Signed, Kathyrn Drown, NP  11/06/2017 9:28 AM   Patient seen and examined and history reviewed. Agree with above findings and plan. 76 yo BF seen for CHF exacerbation. Notes progressive increase in orthopnea, PND, dyspnea and edema. Weight has been stable at 205. Eats fairly healthy and does not use salt. No recent chest pain. She has known nonischemic CM with EF 30-35% in the past and probable infiltrative cardiomyopathy. Also history of CKD and HTN  On exam she has JVD to 10 cm. Lungs with bilateral diminished BS in bases with rales and dullness to percussion. CV RRR without gallop or murmur.  2+ LE edema.  Ecg shows NSR with PVC. Nonspecific IVCD. I have personally reviewed and interpreted this study.  CXR shows edema with effusion  L>R  Impression: Acute on chronic systolic and diastolic CHF due to nonischemic CM/infiltrative CM. Clearly needs more diuresis. Will start Lasix 40 mg IV bid. Monitor I/O and daily weight. Watch renal function closely. Replete potassium as needed. Continue metoprolol. Not a good candidate for ACEi/ARB/Aldactone due to CKD. Would add Bidil as BP tolerates. Repeat Echo pending. Troponin leak is due to demand ischemia with CHF. No further ischemic work up needed.   Tabbetha Kutscher Martin[place], [place] 11/06/2017 12:08 PM

## 2017-11-07 DIAGNOSIS — E1122 Type 2 diabetes mellitus with diabetic chronic kidney disease: Secondary | ICD-10-CM

## 2017-11-07 DIAGNOSIS — N183 Chronic kidney disease, stage 3 (moderate): Secondary | ICD-10-CM

## 2017-11-07 LAB — GLUCOSE, CAPILLARY
GLUCOSE-CAPILLARY: 172 mg/dL — AB (ref 65–99)
Glucose-Capillary: 120 mg/dL — ABNORMAL HIGH (ref 65–99)
Glucose-Capillary: 140 mg/dL — ABNORMAL HIGH (ref 65–99)
Glucose-Capillary: 182 mg/dL — ABNORMAL HIGH (ref 65–99)

## 2017-11-07 LAB — CBC WITH DIFFERENTIAL/PLATELET
Basophils Absolute: 0 10*3/uL (ref 0.0–0.1)
Basophils Relative: 1 %
EOS ABS: 0.1 10*3/uL (ref 0.0–0.7)
EOS PCT: 2 %
HCT: 34.1 % — ABNORMAL LOW (ref 36.0–46.0)
HEMOGLOBIN: 11.5 g/dL — AB (ref 12.0–15.0)
LYMPHS ABS: 1.3 10*3/uL (ref 0.7–4.0)
LYMPHS PCT: 41 %
MCH: 29.7 pg (ref 26.0–34.0)
MCHC: 33.7 g/dL (ref 30.0–36.0)
MCV: 88.1 fL (ref 78.0–100.0)
MONOS PCT: 11 %
Monocytes Absolute: 0.4 10*3/uL (ref 0.1–1.0)
Neutro Abs: 1.4 10*3/uL — ABNORMAL LOW (ref 1.7–7.7)
Neutrophils Relative %: 45 %
PLATELETS: 79 10*3/uL — AB (ref 150–400)
RBC: 3.87 MIL/uL (ref 3.87–5.11)
RDW: 16.5 % — ABNORMAL HIGH (ref 11.5–15.5)
WBC: 3.2 10*3/uL — ABNORMAL LOW (ref 4.0–10.5)

## 2017-11-07 LAB — BASIC METABOLIC PANEL
Anion gap: 13 (ref 5–15)
BUN: 47 mg/dL — AB (ref 6–20)
CHLORIDE: 107 mmol/L (ref 101–111)
CO2: 23 mmol/L (ref 22–32)
CREATININE: 2.43 mg/dL — AB (ref 0.44–1.00)
Calcium: 9.7 mg/dL (ref 8.9–10.3)
GFR calc Af Amer: 21 mL/min — ABNORMAL LOW (ref >60)
GFR calc non Af Amer: 18 mL/min — ABNORMAL LOW (ref >60)
Glucose, Bld: 124 mg/dL — ABNORMAL HIGH (ref 65–99)
Potassium: 3.7 mmol/L (ref 3.5–5.1)
Sodium: 143 mmol/L (ref 135–145)

## 2017-11-07 LAB — LEGIONELLA PNEUMOPHILA SEROGP 1 UR AG: L. pneumophila Serogp 1 Ur Ag: NEGATIVE

## 2017-11-07 LAB — BRAIN NATRIURETIC PEPTIDE: B NATRIURETIC PEPTIDE 5: 1122.8 pg/mL — AB (ref 0.0–100.0)

## 2017-11-07 MED ORDER — FUROSEMIDE 10 MG/ML IJ SOLN
60.0000 mg | Freq: Two times a day (BID) | INTRAMUSCULAR | Status: DC
  Administered 2017-11-07 – 2017-11-08 (×2): 60 mg via INTRAVENOUS
  Filled 2017-11-07 (×2): qty 6

## 2017-11-07 NOTE — Progress Notes (Addendum)
PROGRESS NOTE [facility]   Mandy Rhodes   VPX:[id number] DOB: October 18, 1941  DOA: 11/05/2017 PCP: Hendricks Limes, MD   Brief Narrative:  Mandy Rhodes is a 76 y.o. female with medical history significant of systolic and diastolic CHF last EF noted to be 30-35% with grade 2 dFx 10/13/2016, DM type II, CKD stage III, nonobstructing CAD; who presents with complaints of progressively worsening shortness of breath for last 1 week. Upon ED evaluation CXR showed cardiomegaly with increase in vascular congestion and left pleura effusion. Also there was suspected PNA so patient was started on empiric. Cardiology consulted.   Subjective: Patient seen and examined, want to go home. No new complaints. Denies chest pain, SOB and palpitation and dizziness.   Assessment & Plan: Acute on Chronic combine systolic and diastolic CHF  EF 46-27% in 09/2016 Elevated BNP - repeat BNP   ECHO EF reduced 10% previous 30-35%, new 25-30% with G2DD   Unable to use ACE/ARBs and aldactone due to CKD  Inaccurate I&O's and weight - clinically patient has improved Cr has increase but patient but no good UOP. Will increase Lasix tonight  Continue Bidil  Monitor Cr closely   Pneumonia Left base infiltrate on CXR Strep pneumo antigen positive  Continue Rocephin for now treat for total of 5 days of abx, can switch to oral abx in AM  Procalcitonin negative    Elevated troponin Likely demand ischemia from CHF  Cards recommendations appreciated  No further ischemic work up needed   AKI on CKD stage IV - ? Cardiorenal, poor UOP  Baseline ~ Cr 1.9 Cr up to 2.4 Increasing Lasix for tonight   HTN  BP stable  Continue current regimen   DM type 2  CBGs stable  Hold oral agents  Continue SSI   DVT prophylaxis: SCD Code Status: Full Code  Family Communication: None at bedside  Disposition Plan: Home when medically stable   Consultants:   Cardiology   Procedures:   ECHO - pending    Antimicrobials: Anti-infectives (From admission, onward)   Start     Dose/Rate Route Frequency Ordered Stop   11/06/17 2200  cefTRIAXone (ROCEPHIN) 1 g in dextrose 5 % 50 mL IVPB     1 g 100 mL/hr over 30 Minutes Intravenous Every 24 hours 11/05/17 2211 11/12/17 2159   11/06/17 2200  azithromycin (ZITHROMAX) 500 mg in dextrose 5 % 250 mL IVPB  Status:  Discontinued     500 mg 250 mL/hr over 60 Minutes Intravenous Every 24 hours 11/05/17 2211 11/06/17 0632   11/05/17 2015  cefTRIAXone (ROCEPHIN) 1 g in dextrose 5 % 50 mL IVPB     1 g 100 mL/hr over 30 Minutes Intravenous  Once 11/05/17 2014 11/05/17 2131   11/05/17 2015  azithromycin (ZITHROMAX) 500 mg in dextrose 5 % 250 mL IVPB     500 mg 250 mL/hr over 60 Minutes Intravenous  Once 11/05/17 2014 11/05/17 2208          Objective: Vitals:   11/07/17 0040 11/07/17 0300 11/07/17 0400 11/07/17 1134  BP: (!) 100/55  108/61 103/68  Pulse: 62  63 62  Resp: 18  18 18   Temp: 97.7 F (36.5 C)  98 F (36.7 C) (!) 97.5 F (36.4 C)  TempSrc: Oral  Oral Oral  SpO2: 96%  95% 96%  Weight:  106.9 kg (235 lb 10.8 oz)    Height:        Intake/Output Summary (Last 24 hours)  at 11/07/2017 1302 Last data filed at 11/07/2017 0901 Gross per 24 hour  Intake 440 ml  Output 1400 ml  Net -960 ml   Filed Weights   11/05/17 1729 11/06/17 1534 11/07/17 0300  Weight: 93 kg (205 lb) 105.2 kg (231 lb 14.8 oz) 106.9 kg (235 lb 10.8 oz)    Examination:  General: NAD  Cardiovascular: RRR, S1/S2  Respiratory: Good air entry, mild bibasilar crackles  Abdominal: Soft, NT, ND, bowel sounds + Extremities: Left side hemiparesis. LE edema   Data Reviewed: I have personally reviewed following labs and imaging studies  CBC: Recent Labs  Lab 11/05/17 1916 11/06/17 0132 11/07/17 0438  WBC 3.5* 3.1* 3.2*  NEUTROABS 2.0 1.6* 1.4*  HGB 13.0 12.4 11.5*  HCT 37.9 36.0 34.1*  MCV 89.0 88.7 88.1  PLT 94* 80* 79*   Basic Metabolic Panel: Recent  Labs  Lab 11/05/17 1916 11/06/17 0132 11/07/17 0438  NA 143 142 143  K 3.9 3.8 3.7  CL 108 109 107  CO2 26 23 23   GLUCOSE 114* 98 124*  BUN 47* 45* 47*  CREATININE 2.51* 2.30* 2.43*  CALCIUM 10.0 9.6 9.7   GFR: Estimated Creatinine Clearance: 24.8 mL/min (A) (by C-G formula based on SCr of 2.43 mg/dL (H)). Liver Function Tests: No results for input(s): AST, ALT, ALKPHOS, BILITOT, PROT, ALBUMIN in the last 168 hours. No results for input(s): LIPASE, AMYLASE in the last 168 hours. No results for input(s): AMMONIA in the last 168 hours. Coagulation Profile: No results for input(s): INR, PROTIME in the last 168 hours. Cardiac Enzymes: Recent Labs  Lab 11/05/17 2210 11/06/17 0132 11/06/17 0322  TROPONINI 0.37* 0.35* 0.34*   BNP (last 3 results) No results for input(s): PROBNP in the last 8760 hours. HbA1C: No results for input(s): HGBA1C in the last 72 hours. CBG: Recent Labs  Lab 11/06/17 1342 11/06/17 1702 11/06/17 2145 11/07/17 0458 11/07/17 1121  GLUCAP 122* 163* 150* 120* 140*   Lipid Profile: Recent Labs    11/06/17 0322  CHOL 146  HDL 56  LDLCALC 82  TRIG 40  CHOLHDL 2.6   Thyroid Function Tests: No results for input(s): TSH, T4TOTAL, FREET4, T3FREE, THYROIDAB in the last 72 hours. Anemia Panel: No results for input(s): VITAMINB12, FOLATE, FERRITIN, TIBC, IRON, RETICCTPCT in the last 72 hours. Sepsis Labs: Recent Labs  Lab 11/06/17 2001  PROCALCITON <0.10    Recent Results (from the past 240 hour(s))  Culture, blood (routine x 2) Call MD if unable to obtain prior to antibiotics being given     Status: None (Preliminary result)   Collection Time: 11/05/17 10:31 PM  Result Value Ref Range Status   Specimen Description BLOOD LEFT ANTECUBITAL  Final   Special Requests   Final    BOTTLES DRAWN AEROBIC AND ANAEROBIC Blood Culture adequate volume   Culture PENDING  Incomplete   Report Status PENDING  Incomplete     Radiology Studies: Dg Chest 2  View  Result Date: 11/05/2017 CLINICAL DATA:  Short of breath EXAM: CHEST  2 VIEW COMPARISON:  04/04/2017 FINDINGS: Probable moderate left pleural effusion. Cardiomegaly with vascular congestion. Atelectasis or infiltrate at the left base. No pneumothorax. IMPRESSION: 1. Cardiomegaly with vascular congestion 2. Suspected moderate left pleural effusion. Dense atelectasis or infiltrate at the left base. Electronically Signed   By: Donavan Foil M.D.   On: 11/05/2017 18:47    Scheduled Meds: . aspirin EC  81 mg Oral Daily  . ferrous sulfate  325 mg  Oral Daily  . insulin aspart  0-9 Units Subcutaneous TID WC  . isosorbide-hydrALAZINE  1 tablet Oral TID  . metoprolol tartrate  25 mg Oral BID  . multivitamin with minerals  1 tablet Oral q morning - 10a  . sodium chloride flush  3 mL Intravenous Q12H   Continuous Infusions: . sodium chloride    . cefTRIAXone (ROCEPHIN)  IV Stopped (11/07/17 0441)     LOS: 2 days    Time spent: Total of 25 minutes spent with pt, greater than 50% of which was spent in discussion of  treatment, counseling and coordination of care  Chipper Oman, MD Pager: Text Page via [email or web address]   If 7PM-7AM, please contact night-coverage [email or web address] 11/07/2017, 1:02 PM

## 2017-11-07 NOTE — Progress Notes (Signed)
MD called to check on output of pt  MD stated he will increase pt lasix

## 2017-11-07 NOTE — Progress Notes (Signed)
Progress Note  Patient Name: Mandy Rhodes Date of Encounter: 11/07/2017  Primary Cardiologist: Dr. Debara Pickett  Subjective   Admits to breathing better today. No issues overnight. Has been urinating "a lot". No CP, palpitations, orthopnea.   Inpatient Medications    Scheduled Meds: . aspirin EC  81 mg Oral Daily  . ferrous sulfate  325 mg Oral Daily  . furosemide  40 mg Intravenous BID  . insulin aspart  0-9 Units Subcutaneous TID WC  . isosorbide-hydrALAZINE  1 tablet Oral TID  . metoprolol tartrate  25 mg Oral BID  . multivitamin with minerals  1 tablet Oral q morning - 10a  . sodium chloride flush  3 mL Intravenous Q12H   Continuous Infusions: . sodium chloride    . cefTRIAXone (ROCEPHIN)  IV Stopped (11/07/17 0441)   PRN Meds: sodium chloride, acetaminophen, sodium chloride flush   Vital Signs    Vitals:   11/06/17 1952 11/07/17 0040 11/07/17 0300 11/07/17 0400  BP: (!) 97/57 (!) 100/55  108/61  Pulse: 60 62  63  Resp: 18 18  18   Temp: (!) 97.4 F (36.3 C) 97.7 F (36.5 C)  98 F (36.7 C)  TempSrc: Oral Oral  Oral  SpO2: 94% 96%  95%  Weight:   235 lb 10.8 oz (106.9 kg)   Height:        Intake/Output Summary (Last 24 hours) at 11/07/2017 1010 Last data filed at 11/07/2017 0901 Gross per 24 hour  Intake 440 ml  Output 1400 ml  Net -960 ml   Filed Weights   11/05/17 1729 11/06/17 1534 11/07/17 0300  Weight: 205 lb (93 kg) 231 lb 14.8 oz (105.2 kg) 235 lb 10.8 oz (106.9 kg)    Physical Exam   General: Well developed, well nourished, NAD Skin: Warm, dry, intact  Head: Normocephalic, atraumatic,  clear, moist mucus membranes. Neck: Negative for carotid bruits. + JVD Lungs: Diminished bilaterally, mild crackles in the bases. No wheezes, rales, or rhonchi. Breathing is unlabored. Cardiovascular: RRR with S1 S2. No murmurs, rubs, or gallops, Abdomen: Soft, non-tender, non-distended with normoactive bowel sounds. No obvious abdominal masses. MSK:  Strength and tone appear normal for age. 5/5 in all extremities Extremities: 2-3+ LE edema. No clubbing or cyanosis. DP/PT pulses 1+ bilaterally Neuro: Alert and oriented. No focal deficits. No facial asymmetry. MAE spontaneously. Psych: Responds to questions appropriately with normal affect.     Labs    Chemistry Recent Labs  Lab 11/05/17 1916 11/06/17 0132 11/07/17 0438  NA 143 142 143  K 3.9 3.8 3.7  CL 108 109 107  CO2 26 23 23   GLUCOSE 114* 98 124*  BUN 47* 45* 47*  CREATININE 2.51* 2.30* 2.43*  CALCIUM 10.0 9.6 9.7  GFRNONAA 18* 19* 18*  GFRAA 20* 23* 21*  ANIONGAP 9 10 13      Hematology Recent Labs  Lab 11/05/17 1916 11/06/17 0132 11/07/17 0438  WBC 3.5* 3.1* 3.2*  RBC 4.26 4.06 3.87  HGB 13.0 12.4 11.5*  HCT 37.9 36.0 34.1*  MCV 89.0 88.7 88.1  MCH 30.5 30.5 29.7  MCHC 34.3 34.4 33.7  RDW 16.7* 16.6* 16.5*  PLT 94* 80* 79*    Cardiac Enzymes Recent Labs  Lab 11/05/17 2210 11/06/17 0132 11/06/17 0322  TROPONINI 0.37* 0.35* 0.34*    Recent Labs  Lab 11/05/17 1922  TROPIPOC 0.34*     BNP Recent Labs  Lab 11/05/17 1916  BNP 1,651.6*     DDimer  No results for input(s): DDIMER in the last 168 hours.   Radiology    Dg Chest 2 View  Result Date: 11/05/2017 CLINICAL DATA:  Short of breath EXAM: CHEST  2 VIEW COMPARISON:  04/04/2017 FINDINGS: Probable moderate left pleural effusion. Cardiomegaly with vascular congestion. Atelectasis or infiltrate at the left base. No pneumothorax. IMPRESSION: 1. Cardiomegaly with vascular congestion 2. Suspected moderate left pleural effusion. Dense atelectasis or infiltrate at the left base. Electronically Signed   By: Donavan Foil M.D.   On: 11/05/2017 18:47    Telemetry    11/07/17- NSR rate ??? - Personally Reviewed  ECG     11/06/17 NSR with occasional PVC - Personally Reviewed  Cardiac Studies   ECHO 11/06/17: Impressions:  - Compared to a prior study in 09/2016, the LVEF is lower at  25-30%   with severe LV wall thickness and global hypokinesis. LV filling   pressures are elevated and there is a left pleural effusion.   ECHO: 10/13/2016  Study Conclusions  - Left ventricle: The cavity size was normal. There was moderate concentric hypertrophy. The appearance was consistent with hypertensive changes or infiltrative cardiomyopathy, such as amyloidosis. Systolic function was moderately to severely reduced. The estimated ejection fraction was in the range of 30% to 35%. Moderate diffuse hypokinesis with no identifiable regional variations. Features are consistent with a pseudonormal left ventricular filling pattern, with concomitant abnormal relaxation and increased filling pressure (grade 2 diastolic dysfunction). - Mitral valve: Valve area by pressure half-time: 2.42 cm^2. - Left atrium: The atrium was moderately dilated. - Pulmonary arteries: Systolic pressure was mildly increased. PA peak pressure: 39 mm Hg (S).  CATH: 05/03/207  Prox LAD lesion, 20% stenosed.  Ost Ramus to Ramus lesion, 10% stenosed.   Minor nonobstructive CAD  Plan: medical management.   Patient Profile     76 y.o. female  with a hx of combined systolic and diastolic CHF (EF at 96-22% per echo 09/2016), nonischemic cardiomyopathy (per cath 03/2016 with 20% prox LAD), HTN, HLD, DM II, CKD, and CAD who is being seen today for the evaluation of increasing SOB at the request of Dr. Tamala Julian.   Assessment & Plan    1. Chronic combined systolic and diastolic CHF: -Troponin peak most likely related to demand ischemia -Echo 11/06/17 revealed an EF of 25-30%, down from 30-35% 2017 -Baseline weight 205lb. She is 235lb today 11/07/17.There is a large discrepancy in the admission weight and today's weight that does not clearly match the clinical picture. Should base further assessment on the 230lb 11/06/17 weight -She is negative 1L today and net negative 737ml since  admission  -Continue Lasix IV 40mg  BID  -Strict I & O, daily weight -Not a good candidate for ACE/ARB due to CKD  2. Nonischemic cardiomyopathy: -Known EF 30-35%  Per Echo -Cardiac cath 03/2016, non-obstructive CAD and to medically manage -Continue ASA, BB -Troponin peak related to demand ischemia -No further workup for ischemia needed per MD  3. HTN: -BP stable, 108/61, HR 62 -ASA, BB  4. Stage 3 CKD: -Creatinine continues to trend up, 2.43 today 11/07/17, was 2.30 11/06/17 -Her baseline appears to be about 1.3-2.0 range -Will need to closely watch given the need to balance diuresing and kidney function.   Signed, Kathyrn Drown, NP  11/07/2017, 10:10 AM    For questions or updates, please contact   Please consult [email or web address] for contact info under Cardiology/STEMI.   Patient seen and examined and history reviewed. Agree with above findings and  plan. Patient responded well to IV lasix. Notes an improvement in her breathing. I/O negative one liter. Weight not reliable. BP is well controlled and she is tolerating BIDIL ok. Echo shows EF 25-30% with severe LVH. Renal function is fairly stable.   Will continue IV lasix. On metoprolol and Bidil. Continue to monitor renal function closely. May need to consider cardiac MRI at some point for infiltrative CM.   Bladyn Tipps [place], [place] 11/07/2017 12:32 PM

## 2017-11-07 NOTE — Care Management Note (Addendum)
Case Management Note  Patient Details  Name: Mandy Rhodes MRN: [id number] Date of Birth: 06/12/1941  Subjective/Objective:  shortness of breath  Action/Plan: Prior to admission Pt lived home with granddaughter.  PCP is Harlan Stains, MD.  Uses [facility] on [address]  Home DME devices: wheelchair, walker.  Has Personal helper provided by [employer] daily from 9-11:30.  Personal helper cooks breakfast and granddaughter cooks Health visitor. Pt states she receives [facility] PT/OT prior to admission with [facility], [address], [place], [place] [address];[phone].  Uses [employer] for transportation [id number], medical appointments.  NCM will continue to follow for discharge needs.               Expected Discharge Plan:  [place]  In-House Referral:  NA  Discharge planning Services  CM Consult  Status of Service:  In process, will continue to follow  Kristen Cardinal, RN 11/07/2017, 3:13 PM [phone]

## 2017-11-07 NOTE — Evaluation (Signed)
Physical Therapy Evaluation Patient Details Name: Mandy Rhodes MRN: [id number] DOB: 1941/10/22 Today's Date: 11/07/2017   History of Present Illness  Pt is a 76 y.o. female admitted 11/05/17 with c/o progressively worsening SOB; CXR showed cardiomegaly with left pleural effusion. Also suspected PNA. Marland Kitchen Pertinent PMH includes NSTEMI/VDRF 11/17, ACDF w/ hardware removal, DM, CHF, CKD, HTN, anemia, R rotator cuff repair, neuropathy.     Clinical Impression  Pt presents with deconditioning and an overall decrease in functional mobility secondary to above. PTA, pt working with HHPT, primarily uses manual w/c for mobility and RW for stand pivot transfers; requires assist from granddaughter and home aide for all ADLs and intermittent assist for mobility. Pt required maxA for bed mobility today; when attempting to sit EOB, noted pt incontinent of urine, dependent for pericare and bed change. Discussed with pt PT's recommendation for continued therapies at SNF prior to returning home, but pt adamantly refusing. If she refuses, will need to max out HHPT/OT/RN services to maximize pt safety and functional mobility at home. Will continue to follow acutely to address established goals.     Follow Up Recommendations SNF(pt refusing SNF; willing to continue with HHPT)    Equipment Recommendations  None recommended by PT    Recommendations for Other Services OT consult     Precautions / Restrictions Precautions Precautions: Fall Restrictions Weight Bearing Restrictions: No      Mobility  Bed Mobility Overal bed mobility: Needs Assistance Bed Mobility: Rolling;Supine to Sit Rolling: Max assist   Supine to sit: Max assist     General bed mobility comments: Pt initially attempting to sit EOB requiring significant time and effort and maxA to assist BLEs to EOB. Upon starting to sit, noted to be incontinent of urine, returning to supine requiring maxA to roll R/L for pericare and bed change.  Significant time and effort for all mobility secondary to weakness and pain  Transfers                    Ambulation/Gait                Stairs            Wheelchair Mobility    Modified Rankin (Stroke Patients Only)       Balance Overall balance assessment: Needs assistance                                           Pertinent Vitals/Pain Pain Assessment: Faces Faces Pain Scale: Hurts a little bit Pain Location: Generalized Pain Descriptors / Indicators: Discomfort;Grimacing Pain Intervention(s): Monitored during session;Repositioned    Home Living Family/patient expects to be discharged to:: Private residence Living Arrangements: Other relatives(Granddaughter) Available Help at Discharge: Family;Personal care attendant;Available PRN/intermittently(Granddaughter) Type of Home: House Home Access: Ramped entrance     Home Layout: One level Home Equipment: Wann - 2 wheels;Wheelchair - manual;Bedside commode      Prior Function Level of Independence: Needs assistance   Gait / Transfers Assistance Needed: Currently receiving HHPT. Able to amb some with RW to get to manual w/c. Increased difficulty self-propelling manual w/c.   ADL's / Homemaking Assistance Needed: Dependent on personal care attendant for assist with ADLs (bathing, dressing, cooking)        Hand Dominance        Extremity/Trunk Assessment   Upper Extremity Assessment Upper Extremity Assessment: Generalized  weakness;LUE deficits/detail;RUE deficits/detail RUE Coordination: decreased fine motor LUE Coordination: decreased fine motor    Lower Extremity Assessment Lower Extremity Assessment: RLE deficits/detail;LLE deficits/detail RLE Deficits / Details: Grossly 2/5 hip flexion and knee flex/ext as pt not able to completely move against gravity LLE Deficits / Details: Grossly 2/5 hip flexion and knee flex/ext as pt not able to completely move against  gravity       Communication   Communication: No difficulties  Cognition Arousal/Alertness: Awake/alert Behavior During Therapy: WFL for tasks assessed/performed Overall Cognitive Status: Within Functional Limits for tasks assessed                                        General Comments      Exercises     Assessment/Plan    PT Assessment Patient needs continued PT services  PT Problem List Decreased range of motion;Decreased strength;Decreased activity tolerance;Decreased balance;Decreased mobility;Decreased knowledge of use of DME;Pain;Decreased skin integrity       PT Treatment Interventions DME instruction;Gait training;Stair training;Functional mobility training;Therapeutic activities;Therapeutic exercise;Balance training;Wheelchair mobility training;Patient/family education    PT Goals (Current goals can be found in the Care Plan section)  Acute Rehab PT Goals Patient Stated Goal: Return home ASAP PT Goal Formulation: With patient Time For Goal Achievement: 11/21/17 Potential to Achieve Goals: Good    Frequency Min 3X/week   Barriers to discharge Decreased caregiver support      Co-evaluation               AM-PAC PT "6 Clicks" Daily Activity  Outcome Measure Difficulty turning over in bed (including adjusting bedclothes, sheets and blankets)?: Unable Difficulty moving from lying on back to sitting on the side of the bed? : Unable Difficulty sitting down on and standing up from a chair with arms (e.g., wheelchair, bedside commode, etc,.)?: Unable Help needed moving to and from a bed to chair (including a wheelchair)?: A Lot Help needed walking in hospital room?: A Lot Help needed climbing 3-5 steps with a railing? : A Lot 6 Click Score: 9    End of Session   Activity Tolerance: Patient limited by pain Patient left: in bed;with call bell/phone within reach;with nursing/sitter in room Nurse Communication: Mobility status PT Visit  Diagnosis: Other abnormalities of gait and mobility (R26.89);Muscle weakness (generalized) (M62.81)    Time: 6283-6629 PT Time Calculation (min) (ACUTE ONLY): 33 min   Charges:   PT Evaluation $PT Eval Moderate Complexity: 1 Mod PT Treatments $Therapeutic Activity: 8-22 mins   PT G Codes:       Mabeline Caras, PT, DPT Acute Rehab Services  Pager: [phone] 11/07/2017, 5:22 PM

## 2017-11-08 LAB — GLUCOSE, CAPILLARY
GLUCOSE-CAPILLARY: 89 mg/dL (ref 65–99)
GLUCOSE-CAPILLARY: 142 mg/dL — AB (ref 65–99)
GLUCOSE-CAPILLARY: 155 mg/dL — AB (ref 65–99)
Glucose-Capillary: 109 mg/dL — ABNORMAL HIGH (ref 65–99)

## 2017-11-08 LAB — CBC WITH DIFFERENTIAL/PLATELET
Basophils Absolute: 0 10*3/uL (ref 0.0–0.1)
Basophils Relative: 0 %
EOS ABS: 0.1 10*3/uL (ref 0.0–0.7)
Eosinophils Relative: 2 %
HEMATOCRIT: 33.6 % — AB (ref 36.0–46.0)
HEMOGLOBIN: 11.4 g/dL — AB (ref 12.0–15.0)
LYMPHS ABS: 1.1 10*3/uL (ref 0.7–4.0)
Lymphocytes Relative: 29 %
MCH: 29.8 pg (ref 26.0–34.0)
MCHC: 33.9 g/dL (ref 30.0–36.0)
MCV: 87.7 fL (ref 78.0–100.0)
MONOS PCT: 14 %
Monocytes Absolute: 0.5 10*3/uL (ref 0.1–1.0)
NEUTROS PCT: 55 %
Neutro Abs: 2 10*3/uL (ref 1.7–7.7)
Platelets: 87 10*3/uL — ABNORMAL LOW (ref 150–400)
RBC: 3.83 MIL/uL — ABNORMAL LOW (ref 3.87–5.11)
RDW: 16.3 % — ABNORMAL HIGH (ref 11.5–15.5)
WBC: 3.6 10*3/uL — ABNORMAL LOW (ref 4.0–10.5)

## 2017-11-08 LAB — BASIC METABOLIC PANEL
Anion gap: 8 (ref 5–15)
BUN: 43 mg/dL — ABNORMAL HIGH (ref 6–20)
CHLORIDE: 106 mmol/L (ref 101–111)
CO2: 28 mmol/L (ref 22–32)
CREATININE: 2.55 mg/dL — AB (ref 0.44–1.00)
Calcium: 9.6 mg/dL (ref 8.9–10.3)
GFR calc non Af Amer: 17 mL/min — ABNORMAL LOW (ref >60)
GFR, EST AFRICAN AMERICAN: 20 mL/min — AB (ref >60)
Glucose, Bld: 126 mg/dL — ABNORMAL HIGH (ref 65–99)
Potassium: 3.5 mmol/L (ref 3.5–5.1)
Sodium: 142 mmol/L (ref 135–145)

## 2017-11-08 MED ORDER — METOPROLOL TARTRATE 12.5 MG HALF TABLET
12.5000 mg | ORAL_TABLET | Freq: Two times a day (BID) | ORAL | Status: DC
  Administered 2017-11-08 – 2017-11-11 (×6): 12.5 mg via ORAL
  Filled 2017-11-08 (×6): qty 1

## 2017-11-08 MED ORDER — FUROSEMIDE 80 MG PO TABS
160.0000 mg | ORAL_TABLET | Freq: Two times a day (BID) | ORAL | Status: DC
  Administered 2017-11-08 – 2017-11-11 (×6): 160 mg via ORAL
  Filled 2017-11-08 (×6): qty 2

## 2017-11-08 NOTE — Consult Note (Signed)
Reason for Consult:CKD and oliguria Referring Physician: Dr. Prudencio Pair is an 76 y.o. female.  HPI: 76 yr female with >30 yr HTN and DM , nonobstructive CAD, GERD, R rotator cuff dz, spinal stenosis, DJD, ?infiltrative CM, EF 30-35%. Now admitted with about 1 wk of progressive edema, PND, orthop,DOE.  Followed at our office by Dr. Hinton Rao seen 1/18 with Cr 1.5.  In past year Cr has risen 1.9-2.3,  assoc with multiple admits for CHF. ?LL pneu and has large L pleural effusion.  Pos for Strep pneumo.  Denies, N, V, D or cough at this time Cr running 2.4s to 2.5s here but minimally neg fluid balance on Lasix '40mg'$  iv bid.  No CP but had tightness, seen by cards and felt stess induced ^ trop.   Constitutional: breathing primary issue, gets around with walker Eyes: glasses Ears, nose, mouth, throat, and face: negative Respiratory: as above Cardiovascular: edema, PND, othop,  Gastrointestinal: D a week ago Genitourinary:noct x 2-3 Integument/breast: itching Musculoskeletal:R rotator cuff, bilat knee, back issues Neurological: negative Endocrine: Sugars, 80-170 Allergic/Immunologic: Sulfa  Primary Nephrologist [facility].    Past Medical History:  Diagnosis Date  . Anemia   . Arthritis   . CAD (coronary artery disease)    a. minimal CAD by cath in 03/2016  . CHF (congestive heart failure) ([place])    a. 09/2016: Echo with EF of 30-35%, moderate diffuse HK, Grade 2 DD, PA peak pressure of 39 mm Hg.  Marland Kitchen Chronic kidney disease    Renal Insuffiency, see [facility] once a year  . Diabetes mellitus    type 2  . GERD (gastroesophageal reflux disease)   . History of rotator cuff tear    Torn right rotator cuff.  . History of spinal stenosis   . Hypertension   . Infiltrative cardiomyopathy ([place])    a, presumed cardiac amyloidosis by MRI with prior nondiagnostic fat pad biopsy  . Neuropathy associated with endocrine disorder [facility])     Past Surgical History:  Procedure  Laterality Date  . ABDOMINAL HYSTERECTOMY  1971   Partial  . ANTERIOR CERVICAL DECOMP/DISCECTOMY FUSION N/A 10/18/2016   Procedure: debridement and drainage cervical prevertebral abscess  ANTERIOR CERVICAL HARDWARE REMOVAL exploration cervical fusion;  Surgeon: Earnie Larsson, MD;  Location: [facility];  Service: Neurosurgery;  Laterality: N/A;  . APPENDECTOMY    . BACK SURGERY  2009   Disectomy  . CARDIAC CATHETERIZATION N/A 03/22/2016   Procedure: Left Heart Cath and Coronary Angiography;  Surgeon: Peter M Martinique, MD;  Location: [place] [facility];  Service: Cardiovascular;  Laterality: N/A;  . CHOLECYSTECTOMY  1969   Status post   . CYST REMOVAL TRUNK Right 12/03/2014   Procedure: EXCISION OF RIGHT BACK CYST ;  Surgeon: Coralie Keens, MD;  Location: [facility];  Service: General;  Laterality: Right;  . DIRECT LARYNGOSCOPY N/A 10/17/2016   Procedure: DIRECT LARYNGOSCOPY,  ESOPHAGOSCOPY;  Surgeon: [name], MD;  Location: [facility];  Service: ENT;  Laterality: N/A;  . FOOT SURGERY Bilateral    bone removed  . IR GASTROSTOMY TUBE REMOVAL  05/03/2017  . IR GENERIC HISTORICAL  10/25/2016   IR GASTROSTOMY TUBE MOD SED 10/25/2016 [name], MD [facility]INTER[facility] RAD  . NEUROPLASTY / TRANSPOSITION MEDIAN NERVE AT CARPAL TUNNEL Bilateral    Hx  Left carpal tunnel repair  . TRACHEOSTOMY TUBE PLACEMENT N/A 10/17/2016   Procedure: TRACHEOSTOMY;  Surgeon: [name], MD;  Location: [facility];  Service: ENT;  Laterality:  N/A;    Family History  Problem Relation Age of Onset  . Diabetes type II Mother   . Hypertension Father   . Diabetes type II Sister   . Ovarian cancer Sister   . Diabetes type II Other   . Stroke Neg Hx   . CAD Neg Hx   . Heart failure Neg Hx     Social History:  reports that she has quit smoking. she has never used smokeless tobacco. She reports that she does not drink alcohol or use drugs.  Allergies:  Allergies  Allergen Reactions  . Sulfa Antibiotics Swelling    Medications:   I have reviewed the patient's current medications. Prior to Admission:  Medications Prior to Admission  Medication Sig Dispense Refill Last Dose  . acetaminophen (TYLENOL) 325 MG tablet Take 650 mg by mouth every 6 (six) hours as needed for mild pain.   10/29/2017 at prn  . aspirin 81 MG EC tablet Chew 81 mg by mouth daily.    11/03/2017  . Calcium Carb-Cholecalciferol (CALCIUM 600 + D PO) Take 1 tablet by mouth every morning.    11/05/2017 at Unknown time  . ferrous sulfate 325 (65 FE) MG EC tablet Take 325 mg by mouth daily.    11/05/2017 at Unknown time  . furosemide (LASIX) 40 MG tablet Take 40 mg by mouth daily.   11/05/2017 at Unknown time  . glimepiride (AMARYL) 1 MG tablet Take 1 mg by mouth daily.   11/05/2017 at Unknown time  . Glycerin-Polysorbate 80 (REFRESH DRY EYE THERAPY OP) Place 1 drop into the left eye 2 (two) times daily.   11/05/2017 at Unknown time  . metoprolol tartrate (LOPRESSOR) 25 MG tablet Place 1 tablet (25 mg total) into feeding tube 2 (two) times daily. (Patient taking differently: Take 25 mg by mouth 2 (two) times daily. )   11/05/2017 at Unknown time  . Multiple Vitamin (TAB-A-VITE) TABS Take 1 tablet by mouth every morning.    11/05/2017 at Unknown time  . nortriptyline (PAMELOR) 10 MG capsule Take 10 mg by mouth at bedtime.    11/04/2017 at Unknown time  . omeprazole (PRILOSEC) 40 MG capsule 40 mg See admin instructions. Per feeding tube once a day   11/05/2017 at Unknown time    Results for orders placed or performed during the hospital encounter of 11/05/17 (from the past 48 hour(s))  Glucose, capillary     Status: Abnormal   Collection Time: 11/06/17  5:02 PM  Result Value Ref Range   Glucose-Capillary 163 (H) 65 - 99 mg/dL   Comment 1 Notify RN    Comment 2 Document in Chart   Procalcitonin - Baseline     Status: None   Collection Time: 11/06/17  8:01 PM  Result Value Ref Range   Procalcitonin <0.10 ng/mL    Comment:        Interpretation: PCT  (Procalcitonin) <= 0.5 ng/mL: Systemic infection (sepsis) is not likely. Local bacterial infection is possible. (NOTE)       Sepsis PCT Algorithm           Lower Respiratory Tract                                      Infection PCT Algorithm    ----------------------------     ----------------------------         PCT < 0.25 ng/mL  PCT < 0.10 ng/mL         Strongly encourage             Strongly discourage   discontinuation of antibiotics    initiation of antibiotics    ----------------------------     -----------------------------       PCT 0.25 - 0.50 ng/mL            PCT 0.10 - 0.25 ng/mL               OR       >80% decrease in PCT            Discourage initiation of                                            antibiotics      Encourage discontinuation           of antibiotics    ----------------------------     -----------------------------         PCT >= 0.50 ng/mL              PCT 0.26 - 0.50 ng/mL               AND        <80% decrease in PCT             Encourage initiation of                                             antibiotics       Encourage continuation           of antibiotics    ----------------------------     -----------------------------        PCT >= 0.50 ng/mL                  PCT > 0.50 ng/mL               AND         increase in PCT                  Strongly encourage                                      initiation of antibiotics    Strongly encourage escalation           of antibiotics                                     -----------------------------                                           PCT <= 0.25 ng/mL                                                 OR                                        >  80% decrease in PCT                                     Discontinue / Do not initiate                                             antibiotics   Glucose, capillary     Status: Abnormal   Collection Time: 11/06/17  9:45 PM  Result Value Ref Range    Glucose-Capillary 150 (H) 65 - 99 mg/dL   Comment 1 Notify RN    Comment 2 Document in Chart   Basic metabolic panel     Status: Abnormal   Collection Time: 11/07/17  4:38 AM  Result Value Ref Range   Sodium 143 135 - 145 mmol/L   Potassium 3.7 3.5 - 5.1 mmol/L   Chloride 107 101 - 111 mmol/L   CO2 23 22 - 32 mmol/L   Glucose, Bld 124 (H) 65 - 99 mg/dL   BUN 47 (H) 6 - 20 mg/dL   Creatinine, Ser 2.43 (H) 0.44 - 1.00 mg/dL   Calcium 9.7 8.9 - 10.3 mg/dL   GFR calc non Af Amer 18 (L) >60 mL/min   GFR calc Af Amer 21 (L) >60 mL/min    Comment: (NOTE) The eGFR has been calculated using the CKD EPI equation. This calculation has not been validated in all clinical situations. eGFR's persistently <60 mL/min signify possible Chronic Kidney Disease.    Anion gap 13 5 - 15  CBC with Differential/Platelet     Status: Abnormal   Collection Time: 11/07/17  4:38 AM  Result Value Ref Range   WBC 3.2 (L) 4.0 - 10.5 K/uL   RBC 3.87 3.87 - 5.11 MIL/uL   Hemoglobin 11.5 (L) 12.0 - 15.0 g/dL   HCT 34.1 (L) 36.0 - 46.0 %   MCV 88.1 78.0 - 100.0 fL   MCH 29.7 26.0 - 34.0 pg   MCHC 33.7 30.0 - 36.0 g/dL   RDW 16.5 (H) 11.5 - 15.5 %   Platelets 79 (L) 150 - 400 K/uL    Comment: REPEATED TO VERIFY SPECIMEN CHECKED FOR CLOTS CONSISTENT WITH PREVIOUS RESULT    Neutrophils Relative % 45 %   Neutro Abs 1.4 (L) 1.7 - 7.7 K/uL   Lymphocytes Relative 41 %   Lymphs Abs 1.3 0.7 - 4.0 K/uL   Monocytes Relative 11 %   Monocytes Absolute 0.4 0.1 - 1.0 K/uL   Eosinophils Relative 2 %   Eosinophils Absolute 0.1 0.0 - 0.7 K/uL   Basophils Relative 1 %   Basophils Absolute 0.0 0.0 - 0.1 K/uL  Glucose, capillary     Status: Abnormal   Collection Time: 11/07/17  4:58 AM  Result Value Ref Range   Glucose-Capillary 120 (H) 65 - 99 mg/dL  Glucose, capillary     Status: Abnormal   Collection Time: 11/07/17 11:21 AM  Result Value Ref Range   Glucose-Capillary 140 (H) 65 - 99 mg/dL   Comment 1 Notify RN    Brain natriuretic peptide     Status: Abnormal   Collection Time: 11/07/17  1:53 PM  Result Value Ref Range   B Natriuretic Peptide 1,122.8 (H) 0.0 - 100.0 pg/mL  Glucose, capillary     Status: Abnormal  Collection Time: 11/07/17  4:49 PM  Result Value Ref Range   Glucose-Capillary 182 (H) 65 - 99 mg/dL   Comment 1 Notify RN   Glucose, capillary     Status: Abnormal   Collection Time: 11/07/17  9:25 PM  Result Value Ref Range   Glucose-Capillary 172 (H) 65 - 99 mg/dL  Glucose, capillary     Status: None   Collection Time: 11/08/17  7:26 AM  Result Value Ref Range   Glucose-Capillary 89 65 - 99 mg/dL   Comment 1 Notify RN    Comment 2 Document in Chart   Basic metabolic panel     Status: Abnormal   Collection Time: 11/08/17  9:01 AM  Result Value Ref Range   Sodium 142 135 - 145 mmol/L   Potassium 3.5 3.5 - 5.1 mmol/L   Chloride 106 101 - 111 mmol/L   CO2 28 22 - 32 mmol/L   Glucose, Bld 126 (H) 65 - 99 mg/dL   BUN 43 (H) 6 - 20 mg/dL   Creatinine, Ser 2.55 (H) 0.44 - 1.00 mg/dL   Calcium 9.6 8.9 - 10.3 mg/dL   GFR calc non Af Amer 17 (L) >60 mL/min   GFR calc Af Amer 20 (L) >60 mL/min    Comment: (NOTE) The eGFR has been calculated using the CKD EPI equation. This calculation has not been validated in all clinical situations. eGFR's persistently <60 mL/min signify possible Chronic Kidney Disease.    Anion gap 8 5 - 15  CBC with Differential/Platelet     Status: Abnormal   Collection Time: 11/08/17  9:01 AM  Result Value Ref Range   WBC 3.6 (L) 4.0 - 10.5 K/uL   RBC 3.83 (L) 3.87 - 5.11 MIL/uL   Hemoglobin 11.4 (L) 12.0 - 15.0 g/dL   HCT 33.6 (L) 36.0 - 46.0 %   MCV 87.7 78.0 - 100.0 fL   MCH 29.8 26.0 - 34.0 pg   MCHC 33.9 30.0 - 36.0 g/dL   RDW 16.3 (H) 11.5 - 15.5 %   Platelets 87 (L) 150 - 400 K/uL    Comment: REPEATED TO VERIFY   Neutrophils Relative % 55 %   Neutro Abs 2.0 1.7 - 7.7 K/uL   Lymphocytes Relative 29 %   Lymphs Abs 1.1 0.7 - 4.0 K/uL    Monocytes Relative 14 %   Monocytes Absolute 0.5 0.1 - 1.0 K/uL   Eosinophils Relative 2 %   Eosinophils Absolute 0.1 0.0 - 0.7 K/uL   Basophils Relative 0 %   Basophils Absolute 0.0 0.0 - 0.1 K/uL  Glucose, capillary     Status: Abnormal   Collection Time: 11/08/17 12:13 PM  Result Value Ref Range   Glucose-Capillary 109 (H) 65 - 99 mg/dL    No results found.  ROS Blood pressure (!) 100/56, pulse 64, temperature 97.7 F (36.5 C), temperature source Oral, resp. rate 12, height '5\' 7"'$  (1.702 m), weight 102.1 kg (225 lb 1.4 oz), SpO2 93 %. Physical Exam Physical Examination: General appearance - alert, well appearing, and in no distress and overweight Mental status - alert, oriented to person, place, and time Eyes - pupils equal and reactive, extraocular eye movements intact, funduscopic exam normal, discs flat and sharp Mouth - edentulous Neck - adenopathy noted PCL Lymphatics - posterior cervical nodes Chest - dullness L base, rales above,  Heart - S1 and S2 normal, S4 present, systolic murmur WU9/8 at 2nd left intercostal space Abdomen - obese, pos bs, liver  down 7 cm Extremities - pedal edema 3-4 + Skin - normal coloration and turgor, no rashes, no suspicious skin lesions noted  Assessment/Plan: 1 CKD4 has advanced over past yr due to cardiorenal issues.  Prob with diuesis 2 fold, inadequate dose, and lower BPs due to xs bp meds. Need to let BP rise, stop Bidil, lower metoprolol, use po Lasix in higher doses.  Cardiac status seems worsening.  Will look at secondary effects. 2 CM seems worsening as etiology of issues 3 Hypertension: low bps 4. Anemia of ESRD: mild ,check Fe 5. Metabolic Bone Disease: check PTH 6 Low BPs 7 DM 8 Obesity 9 DJD P UA, ^ dose po Lasix, stop bidil,lower metop, PTH., Fe  Saylah Ketner 11/08/2017, 4:36 PM

## 2017-11-08 NOTE — Progress Notes (Signed)
Unable to collect urinalysis at this time. Will collect when pt voids.

## 2017-11-08 NOTE — Progress Notes (Signed)
Progress Note  Patient Name: Mandy Rhodes Date of Encounter: 11/08/2017  Primary Cardiologist: No primary care provider on file.   Subjective   Patient seen and examined this morning laying in bed. Reports breathing has improved since yesterday. Has been urinating frequently. Did have some pain in her legs yesterday a/w swelling - improved today. Denies CP, orthopnea, abdominal pain.   Inpatient Medications    Scheduled Meds: . aspirin EC  81 mg Oral Daily  . ferrous sulfate  325 mg Oral Daily  . furosemide  60 mg Intravenous BID  . insulin aspart  0-9 Units Subcutaneous TID WC  . isosorbide-hydrALAZINE  1 tablet Oral TID  . metoprolol tartrate  25 mg Oral BID  . multivitamin with minerals  1 tablet Oral q morning - 10a  . sodium chloride flush  3 mL Intravenous Q12H   Continuous Infusions: . sodium chloride    . cefTRIAXone (ROCEPHIN)  IV Stopped (11/07/17 2256)   PRN Meds: sodium chloride, acetaminophen, sodium chloride flush   Vital Signs    Vitals:   11/07/17 2300 11/08/17 0300 11/08/17 0503 11/08/17 0759  BP:  108/65    Pulse:  68  64  Resp: 18 18    Temp: 98.3 F (36.8 C) 98.1 F (36.7 C)    TempSrc: Oral Oral    SpO2: 97% 95%    Weight:   225 lb 1.4 oz (102.1 kg)   Height:        Intake/Output Summary (Last 24 hours) at 11/08/2017 1147 Last data filed at 11/08/2017 1000 Gross per 24 hour  Intake 1010 ml  Output 1250 ml  Net -240 ml   Filed Weights   11/06/17 1534 11/07/17 0300 11/08/17 0503  Weight: 231 lb 14.8 oz (105.2 kg) 235 lb 10.8 oz (106.9 kg) 225 lb 1.4 oz (102.1 kg)    Telemetry    NSR, occasional PVCs, rate in the 60s - Personally Reviewed   Physical Exam   GEN: Elderly appearing female, laying in be in no acute distress.   Neck: + JVD Cardiac: RRR, no murmurs, rubs, or gallops.  Respiratory: diminished breaths sounds b/l with crackles noted in bases b/l. No wheezes, or rhonchi.  GI: obese, NABS, Soft, nontender,  non-distended  MS: b/l 2-3+ pitting edema (L>R); No deformity, distal pulses 2+ b/l Neuro:  A&O x3, MAE spontaneously Psych: Normal affect   Labs    Chemistry Recent Labs  Lab 11/06/17 0132 11/07/17 0438 11/08/17 0901  NA 142 143 142  K 3.8 3.7 3.5  CL 109 107 106  CO2 23 23 28   GLUCOSE 98 124* 126*  BUN 45* 47* 43*  CREATININE 2.30* 2.43* 2.55*  CALCIUM 9.6 9.7 9.6  GFRNONAA 19* 18* 17*  GFRAA 23* 21* 20*  ANIONGAP 10 13 8      Hematology Recent Labs  Lab 11/06/17 0132 11/07/17 0438 11/08/17 0901  WBC 3.1* 3.2* 3.6*  RBC 4.06 3.87 3.83*  HGB 12.4 11.5* 11.4*  HCT 36.0 34.1* 33.6*  MCV 88.7 88.1 87.7  MCH 30.5 29.7 29.8  MCHC 34.4 33.7 33.9  RDW 16.6* 16.5* 16.3*  PLT 80* 79* 87*    Cardiac Enzymes Recent Labs  Lab 11/05/17 2210 11/06/17 0132 11/06/17 0322  TROPONINI 0.37* 0.35* 0.34*    Recent Labs  Lab 11/05/17 1922  TROPIPOC 0.34*     BNP Recent Labs  Lab 11/05/17 1916 11/07/17 1353  BNP 1,651.6* 1,122.8*     DDimer No results for input(s):  DDIMER in the last 168 hours.   Radiology    No results found.  Cardiac Studies   ECHO 11/06/17: Impressions:  - Compared to a prior study in 09/2016, the LVEF is lower at 25-30% with severe LV wall thickness and global hypokinesis. LV filling pressures are elevated and there is a left pleural effusion.   ECHO:10/13/2016  Study Conclusions  - Left ventricle: The cavity size was normal. There was moderate concentric hypertrophy. The appearance was consistent with hypertensive changes or infiltrative cardiomyopathy, such as amyloidosis. Systolic function was moderately to severely reduced. The estimated ejection fraction was in the range of 30% to 35%. Moderate diffuse hypokinesis with no identifiable regional variations. Features are consistent with a pseudonormal left ventricular filling pattern, with concomitant abnormal relaxation and increased filling  pressure (grade 2 diastolic dysfunction). - Mitral valve: Valve area by pressure half-time: 2.42 cm^2. - Left atrium: The atrium was moderately dilated. - Pulmonary arteries: Systolic pressure was mildly increased. PA peak pressure: 39 mm Hg (S).  CATH:05/03/207  Prox LAD lesion, 20% stenosed.  Ost Ramus to Ramus lesion, 10% stenosed.  Minor nonobstructive CAD  Plan: medical management.    Patient Profile     76 y.o. female with a hx of combined systolic and diastolic CHF (EF at 24-23% per echo 09/2016), nonischemic cardiomyopathy (per cath 03/2016 with 20% prox LAD), HTN, HLD, DM II, CKD,and CADwho is being seen today for the evaluation ofincreasing SOBat the request of Dr. Tamala Julian.   Assessment & Plan    1. Chronic combined systolic and diastolic CHF: - Troponin peaked at 0.47 - most likely related to demand ischemia - Echo 11/06/17 revealed an EF of 25-30%, down from 30-35% 2017 - Baseline weight 205lb - down to 225lb today  - She is negative 1.1L today and net negative 929ml since admission  - Stay the course with Lasix IV 40mg  BID  - Continue Bidil, ASA, BB - Strict I & O, daily weight - Not a good candidate for ACE/ARB due to CKD  2. Nonischemic cardiomyopathy: - Known EF 30-35%  Per Echo - Cardiac cath 03/2016, non-obstructive CAD and to medically manage - Continue ASA, BB - Troponin peaked at 0.47 - related to demand ischemia - No further ischemic work-up at this time  3. HTN: - BP stable - Continue BB and Bidil,   4. Stage 3 CKD: baseline Cr 1.3-2.0 - Creatinine continues to trend up, 2.43>>2.55 today  - Will need to closely watch given the need to balance diuresing and kidney function.    For questions or updates, please contact [place] Please consult [email or web address] for contact info under Cardiology/STEMI.      Signed, Abigail Butts, PA-C  11/08/2017, 11:47 AM    [phone]  Patient seen and examined and history  reviewed. Agree with above findings and plan. [name] continues to improve. She is diuresing well. Notes improvement in SOB and edema. With diuresis there is an increase in creatinine which is expected. With continued clinical improvement I would continue IV lasix today and plan to convert to po tomorrow.   Peter Martinique, [place] 11/08/2017 1:11 PM

## 2017-11-08 NOTE — Clinical Social Work Note (Signed)
Clinical Social Work Assessment  Patient Details  Name: Mandy Rhodes MRN: [id number] Date of Birth: 1941-01-07  Date of referral:  11/08/17               Reason for consult:  Facility Placement, Discharge Planning                Permission sought to share information with:    Permission granted to share information::  No  Name::        Agency::     Relationship::     Contact Information:     Housing/Transportation Living arrangements for the past 2 months:  Single Family Home Source of Information:  Patient, Medical Team Patient Interpreter Needed:  None Criminal Activity/Legal Involvement Pertinent to Current Situation/Hospitalization:  No - Comment as needed Significant Relationships:  Adult Children, Friend, Other Family Members Lives with:  Other (Comment)(Granddaughter) Do you feel safe going back to the place where you live?  Yes Need for family participation in patient care:  Yes (Comment)  Care giving concerns:  PT recommending SNF once medically stable for discharge.   Social Worker assessment / plan:  CSW met with patient. No supports at bedside. CSW introduced role and explained that PT recommendations would be discussed. Patient confirmed she prefers HHPT. RNCM aware. No further concerns. CSW signing off as social work intervention is no longer needed.  Employment status:  Retired Nurse, adult PT Recommendations:  [place] / Referral to community resources:  [place]  Patient/Family's Response to care:  Patient refusing SNF but agreeable to HHPT. Patient's family supportive and involved in patient's care. Patient appreciated social work intervention.  Patient/Family's Understanding of and Emotional Response to Diagnosis, Current Treatment, and Prognosis:  Patient has a good understanding of the reason for admission and PT recommendations. Patient appears happy with hospital care.  Emotional  Assessment Appearance:  Appears stated age Attitude/Demeanor/Rapport:  Other(Pleasant) Affect (typically observed):  Accepting, Appropriate, Calm, Pleasant Orientation:  Oriented to Self, Oriented to Place, Oriented to  Time, Oriented to Situation Alcohol / Substance use:  Never Used Psych involvement (Current and /or in the community):  No (Comment)  Discharge Needs  Concerns to be addressed:  Care Coordination Readmission within the last 30 days:  No Current discharge risk:  Dependent with Mobility Barriers to Discharge:  Continued Medical Work up   Candie Chroman, LCSW 11/08/2017, 10:26 AM

## 2017-11-08 NOTE — Progress Notes (Signed)
Occupational Therapy Evaluation Patient Details Name: Mandy Rhodes MRN: [id number] DOB: [birth year] Today's Date: 11/08/2017    History of Present Illness Pt is a 76 y.o. female admitted 11/05/17 with c/o progressively worsening SOB; CXR showed cardiomegaly with left pleural effusion. Also suspected PNA. Marland Kitchen Pertinent PMH includes NSTEMI/VDRF 11/17, ACDF w/ hardware removal, DM, CHF, CKD, HTN, anemia, R rotator cuff repair, neuropathy.    Clinical Impression   PTA, pt lived at home with her grand daughter and had a PCA and was active with home health services. Feel pt can safely DC home with continued HHOT/PT and nurses aide. Will follow acutely to facilitate safe DC home.     Follow Up Recommendations  Home health OT;Supervision - Intermittent    Equipment Recommendations  None recommended by OT    Recommendations for Other Services       Precautions / Restrictions Precautions Precautions: Fall Restrictions Weight Bearing Restrictions: No      Mobility Bed Mobility Overal bed mobility: Needs Assistance Bed Mobility: Supine to Sit Rolling: Mod assist         General bed mobility comments: Increased assistance required due to sheets being saturated in urine  Transfers Overall transfer level: Needs assistance Equipment used: Rolling walker (2 wheeled) Transfers: Sit to/from [facility] Sit to Stand: Min guard Stand pivot transfers: Min guard       General transfer comment: increaed time/shuffled to chair    Balance Overall balance assessment: Needs assistance   Sitting balance-Leahy Scale: Good       Standing balance-Leahy Scale: Poor                             ADL either performed or assessed with clinical judgement   ADL Overall ADL's : Needs assistance/impaired Eating/Feeding: Set up   Grooming: Minimal assistance   Upper Body Bathing: Minimal assistance;Sitting   Lower Body Bathing: Maximal assistance;Bed level    Upper Body Dressing : Moderate assistance;Sitting   Lower Body Dressing: Maximal assistance;Sit to/from stand   Toilet Transfer: Min guard;RW   Toileting- Water quality scientist and Hygiene: Maximal assistance Toileting - Clothing Manipulation Details (indicate cue type and reason): incontinent     Functional mobility during ADLs: Min guard;Rolling walker;Cueing for safety General ADL Comments: Pt required increased time to allow her to complete her mobility at her own pace and using her familiar techniques; pt incontinent     Vision         Perception     Praxis      Pertinent Vitals/Pain Pain Assessment: Faces Faces Pain Scale: Hurts a little bit Pain Location: Generalized Pain Descriptors / Indicators: Discomfort;Grimacing Pain Intervention(s): Limited activity within patient's tolerance     Hand Dominance Right   Extremity/Trunk Assessment Upper Extremity Assessment Upper Extremity Assessment: Generalized weakness;LUE deficits/detail;RUE deficits/detail(L shoulder ROM @ 60FF; R shoulder @ 20 RM) RUE Deficits / Details: Apparent RTC deficits; weak grip but functional RUE Sensation: decreased light touch LUE Deficits / Details: Uses L shoulder functionally; uses as dominant arm LUE Sensation: decreased light touch   Lower Extremity Assessment Lower Extremity Assessment: Defer to PT evaluation   Cervical / Trunk Assessment Cervical / Trunk Assessment: Kyphotic(cervical limitations of ROM)   Communication     Cognition Arousal/Alertness: Awake/alert Behavior During Therapy: WFL for tasks assessed/performed Overall Cognitive Status: Within Functional Limits for tasks assessed  General Comments   Required frequent rest breaks during session    Exercises Exercises: Other exercises Other Exercises Other Exercises: encouraged general ROM while sitting OOB in wc   Shoulder Instructions      Home Living  Family/patient expects to be discharged to:: Private residence Living Arrangements: Other relatives Available Help at Discharge: Family;Personal care attendant;Available PRN/intermittently Type of Home: House Home Access: Ramped entrance     Home Layout: One level     Bathroom Shower/Tub: Teacher, early years/pre: Standard Bathroom Accessibility: Yes How Accessible: Accessible via wheelchair;Accessible via walker Home Equipment: Kinde - 2 wheels;Wheelchair - [employer];Tub bench   Additional Comments: Has AE/toilet aid      Prior Functioning/Environment Level of Independence: Needs assistance  Gait / Transfers Assistance Needed: Currently receiving HHPT. Able to amb some with RW to get to manual w/c. Increased difficulty self-propelling manual w/c.  ADL's / Homemaking Assistance Needed: PCA assists with self care; pt working with HOT on self care and bed mobility            OT Problem List: Decreased strength;Decreased range of motion;Decreased activity tolerance;Impaired balance (sitting and/or standing);Decreased safety awareness;Decreased knowledge of use of DME or AE;Cardiopulmonary status limiting activity;Obesity;Pain;Impaired UE functional use      OT Treatment/Interventions: Self-care/ADL training;Therapeutic exercise;Energy conservation;DME and/or AE instruction;Therapeutic activities;Patient/family education;Balance training    OT Goals(Current goals can be found in the care plan section) Acute Rehab OT Goals Patient Stated Goal: Return home ASAP OT Goal Formulation: With patient Time For Goal Achievement: 11/22/17 Potential to Achieve Goals: Good  OT Frequency: Min 2X/week   Barriers to D/C:            Co-evaluation              AM-PAC PT "6 Clicks" Daily Activity     Outcome Measure Help from another person eating meals?: None Help from another person taking care of personal grooming?: A Little Help from another person  toileting, which includes using toliet, bedpan, or urinal?: A Little Help from another person bathing (including washing, rinsing, drying)?: A Lot Help from another person to put on and taking off regular upper body clothing?: A Lot Help from another person to put on and taking off regular lower body clothing?: A Lot 6 Click Score: 16   End of Session Equipment Utilized During Treatment: Gait belt;Rolling walker Nurse Communication: Mobility status  Activity Tolerance: Patient tolerated treatment well Patient left: Other (comment)(in wc)  OT Visit Diagnosis: Unsteadiness on feet (R26.81);Muscle weakness (generalized) (M62.81);Pain Pain - part of body: (general discomfort)                Time: 1410-1449 OT Time Calculation (min): 39 min Charges:  OT General Charges $OT Visit: 1 Visit OT Evaluation $OT Eval Moderate Complexity: 1 Mod OT Treatments $Self Care/Home Management : 23-37 mins G-Codes:     [facility], OT/L  [phone] 11/08/2017  Imri Lor,HILLARY 11/08/2017, 3:34 PM

## 2017-11-08 NOTE — Progress Notes (Signed)
PROGRESS NOTE [facility]   [name]   XLK:[id number] DOB: 01-05-1941  DOA: 11/05/2017 PCP: Harlan Stains, MD   Brief Narrative:  Mandy Rhodes is a 76 y.o. female with medical history significant of systolic and diastolic CHF last EF noted to be 30-35% with grade 2 dFx 10/13/2016, DM type II, CKD stage III, nonobstructing CAD; who presents with complaints of progressively worsening shortness of breath for last 1 week. Upon ED evaluation CXR showed cardiomegaly with increase in vascular congestion and left pleura effusion. Also there was suspected PNA so patient was started on empiric. Cardiology consulted.   Subjective: Patient continues to improved, SOB and edema decreasing. No acute distress. Denies chest pain and SOB.   Assessment & Plan: Acute on Chronic combine systolic and diastolic CHF  EF 36-64% in 09/2016 ECHO EF reduced 10% previous 30-35%, new 25-30% with G2DD   Unable to use ACE/ARBs and aldactone due to CKD  I&Os and weights continues to be inaccurate - patient clinically improving  Per cardiology continue Lasix IV and change to PO in AM  Continue Bidil  Monitor Cr closely   Pneumonia Left base infiltrate on CXR Strep pneumo antigen positive  Continue Rocephin for now treat for total of 5 days of abx, can switch to oral abx prior to d/c  Procalcitonin negative    Elevated troponin Likely demand ischemia from CHF  Cards recommendations appreciated  No further ischemic work up needed   AKI on CKD stage IV - ? Cardiorenal, poor UOP  Baseline ~ Cr 1.9 Cr up to 2.55 Renal consulted as patient, patient has been referred to nephro but was unable to go.Cr continues to increase expected with Lasix but maybe developing progressive kidney diseases   HTN  BP remains stable  Continue current regimen   DM type 2  CBG's remains stable  Hold oral agents  Continue SSI   DVT prophylaxis: SCD Code Status: Full Code  Family Communication: None at  bedside  Disposition Plan: Home in the next 24-48 hrs   Consultants:   Cardiology   Procedures:   ECHO - pending   Antimicrobials: Anti-infectives (From admission, onward)   Start     Dose/Rate Route Frequency Ordered Stop   11/06/17 2200  cefTRIAXone (ROCEPHIN) 1 g in dextrose 5 % 50 mL IVPB     1 g 100 mL/hr over 30 Minutes Intravenous Every 24 hours 11/05/17 2211 11/12/17 2159   11/06/17 2200  azithromycin (ZITHROMAX) 500 mg in dextrose 5 % 250 mL IVPB  Status:  Discontinued     500 mg 250 mL/hr over 60 Minutes Intravenous Every 24 hours 11/05/17 2211 11/06/17 0632   11/05/17 2015  cefTRIAXone (ROCEPHIN) 1 g in dextrose 5 % 50 mL IVPB     1 g 100 mL/hr over 30 Minutes Intravenous  Once 11/05/17 2014 11/05/17 2131   11/05/17 2015  azithromycin (ZITHROMAX) 500 mg in dextrose 5 % 250 mL IVPB     500 mg 250 mL/hr over 60 Minutes Intravenous  Once 11/05/17 2014 11/05/17 2208          Objective: Vitals:   11/08/17 0300 11/08/17 0503 11/08/17 0759 11/08/17 1215  BP: 108/65   (!) 100/56  Pulse: 68  64 64  Resp: 18   12  Temp: 98.1 F (36.7 C)   97.7 F (36.5 C)  TempSrc: Oral   Oral  SpO2: 95%   93%  Weight:  102.1 kg (225 lb 1.4 oz)  Height:        Intake/Output Summary (Last 24 hours) at 11/08/2017 1609 Last data filed at 11/08/2017 1000 Gross per 24 hour  Intake 770 ml  Output 1050 ml  Net -280 ml   Filed Weights   11/06/17 1534 11/07/17 0300 11/08/17 0503  Weight: 105.2 kg (231 lb 14.8 oz) 106.9 kg (235 lb 10.8 oz) 102.1 kg (225 lb 1.4 oz)    Examination:  General: Pt is alert, awake, not in acute distress Cardiovascular: RRR, S1/S2 +, no rubs, no gallops Respiratory: Good air entry, slight crackles at the bases b/l  Abdominal: Soft, NT, ND Extremities: LE edema, Left side hemiparesis   Data Reviewed: I have personally reviewed following labs and imaging studies  CBC: Recent Labs  Lab 11/05/17 1916 11/06/17 0132 11/07/17 0438 11/08/17 0901    WBC 3.5* 3.1* 3.2* 3.6*  NEUTROABS 2.0 1.6* 1.4* 2.0  HGB 13.0 12.4 11.5* 11.4*  HCT 37.9 36.0 34.1* 33.6*  MCV 89.0 88.7 88.1 87.7  PLT 94* 80* 79* 87*   Basic Metabolic Panel: Recent Labs  Lab 11/05/17 1916 11/06/17 0132 11/07/17 0438 11/08/17 0901  NA 143 142 143 142  K 3.9 3.8 3.7 3.5  CL 108 109 107 106  CO2 26 23 23 28   GLUCOSE 114* 98 124* 126*  BUN 47* 45* 47* 43*  CREATININE 2.51* 2.30* 2.43* 2.55*  CALCIUM 10.0 9.6 9.7 9.6   GFR: Estimated Creatinine Clearance: 23.1 mL/min (A) (by C-G formula based on SCr of 2.55 mg/dL (H)). Liver Function Tests: No results for input(s): AST, ALT, ALKPHOS, BILITOT, PROT, ALBUMIN in the last 168 hours. No results for input(s): LIPASE, AMYLASE in the last 168 hours. No results for input(s): AMMONIA in the last 168 hours. Coagulation Profile: No results for input(s): INR, PROTIME in the last 168 hours. Cardiac Enzymes: Recent Labs  Lab 11/05/17 2210 11/06/17 0132 11/06/17 0322  TROPONINI 0.37* 0.35* 0.34*   BNP (last 3 results) No results for input(s): PROBNP in the last 8760 hours. HbA1C: No results for input(s): HGBA1C in the last 72 hours. CBG: Recent Labs  Lab 11/07/17 1121 11/07/17 1649 11/07/17 2125 11/08/17 0726 11/08/17 1213  GLUCAP 140* 182* 172* 89 109*   Lipid Profile: Recent Labs    11/06/17 0322  CHOL 146  HDL 56  LDLCALC 82  TRIG 40  CHOLHDL 2.6   Thyroid Function Tests: No results for input(s): TSH, T4TOTAL, FREET4, T3FREE, THYROIDAB in the last 72 hours. Anemia Panel: No results for input(s): VITAMINB12, FOLATE, FERRITIN, TIBC, IRON, RETICCTPCT in the last 72 hours. Sepsis Labs: Recent Labs  Lab 11/06/17 2001  PROCALCITON <0.10    Recent Results (from the past 240 hour(s))  Culture, blood (routine x 2) Call MD if unable to obtain prior to antibiotics being given     Status: None (Preliminary result)   Collection Time: 11/05/17 10:31 PM  Result Value Ref Range Status   Specimen  Description BLOOD LEFT ANTECUBITAL  Final   Special Requests   Final    BOTTLES DRAWN AEROBIC AND ANAEROBIC Blood Culture adequate volume   Culture NO GROWTH 2 DAYS  Final   Report Status PENDING  Incomplete  Culture, blood (routine x 2) Call MD if unable to obtain prior to antibiotics being given     Status: None (Preliminary result)   Collection Time: 11/06/17  2:26 AM  Result Value Ref Range Status   Specimen Description BLOOD LEFT FOREARM  Final   Special Requests  Final    BOTTLES DRAWN AEROBIC AND ANAEROBIC Blood Culture adequate volume   Culture NO GROWTH 2 DAYS  Final   Report Status PENDING  Incomplete     Radiology Studies: No results found.  Scheduled Meds: . aspirin EC  81 mg Oral Daily  . ferrous sulfate  325 mg Oral Daily  . furosemide  60 mg Intravenous BID  . insulin aspart  0-9 Units Subcutaneous TID WC  . metoprolol tartrate  12.5 mg Oral BID  . multivitamin with minerals  1 tablet Oral q morning - 10a  . sodium chloride flush  3 mL Intravenous Q12H   Continuous Infusions: . sodium chloride    . cefTRIAXone (ROCEPHIN)  IV Stopped (11/07/17 2256)     LOS: 3 days    Time spent: Total of 25 minutes spent with pt, greater than 50% of which was spent in discussion of  treatment, counseling and coordination of care  Chipper Oman, MD Pager: Text Page via [email or web address]   If 7PM-7AM, please contact night-coverage [email or web address] 11/08/2017, 4:09 PM

## 2017-11-08 NOTE — Progress Notes (Signed)
IV team states that they will return to insert an IV on pt when she is in bed. IV team states they may need to use ultrasound to insert IV.

## 2017-11-09 LAB — RENAL FUNCTION PANEL
ALBUMIN: 3.1 g/dL — AB (ref 3.5–5.0)
ANION GAP: 11 (ref 5–15)
BUN: 43 mg/dL — AB (ref 6–20)
CO2: 25 mmol/L (ref 22–32)
Calcium: 9.6 mg/dL (ref 8.9–10.3)
Chloride: 105 mmol/L (ref 101–111)
Creatinine, Ser: 2.25 mg/dL — ABNORMAL HIGH (ref 0.44–1.00)
GFR calc Af Amer: 23 mL/min — ABNORMAL LOW (ref >60)
GFR calc non Af Amer: 20 mL/min — ABNORMAL LOW (ref >60)
GLUCOSE: 102 mg/dL — AB (ref 65–99)
PHOSPHORUS: 3.3 mg/dL (ref 2.5–4.6)
POTASSIUM: 3.4 mmol/L — AB (ref 3.5–5.1)
Sodium: 141 mmol/L (ref 135–145)

## 2017-11-09 LAB — URINALYSIS, ROUTINE W REFLEX MICROSCOPIC
Bilirubin Urine: NEGATIVE
GLUCOSE, UA: NEGATIVE mg/dL
Hgb urine dipstick: NEGATIVE
KETONES UR: NEGATIVE mg/dL
LEUKOCYTES UA: NEGATIVE
NITRITE: NEGATIVE
PROTEIN: NEGATIVE mg/dL
Specific Gravity, Urine: 1.008 (ref 1.005–1.030)
pH: 6 (ref 5.0–8.0)

## 2017-11-09 LAB — GLUCOSE, CAPILLARY
GLUCOSE-CAPILLARY: 91 mg/dL (ref 65–99)
GLUCOSE-CAPILLARY: 187 mg/dL — AB (ref 65–99)
GLUCOSE-CAPILLARY: 105 mg/dL — AB (ref 65–99)
Glucose-Capillary: 215 mg/dL — ABNORMAL HIGH (ref 65–99)

## 2017-11-09 LAB — IRON AND TIBC
Iron: 52 ug/dL (ref 28–170)
SATURATION RATIOS: 19 % (ref 10.4–31.8)
TIBC: 274 ug/dL (ref 250–450)
UIBC: 222 ug/dL

## 2017-11-09 MED ORDER — ALUM & MAG HYDROXIDE-SIMETH 200-200-20 MG/5ML PO SUSP
30.0000 mL | Freq: Once | ORAL | Status: AC
  Administered 2017-11-09: 30 mL via ORAL
  Filled 2017-11-09: qty 30

## 2017-11-09 MED ORDER — SODIUM CHLORIDE 0.9 % IV SOLN
510.0000 mg | Freq: Once | INTRAVENOUS | Status: AC
  Administered 2017-11-09: 510 mg via INTRAVENOUS
  Filled 2017-11-09: qty 17

## 2017-11-09 MED ORDER — POTASSIUM CHLORIDE CRYS ER 20 MEQ PO TBCR
20.0000 meq | EXTENDED_RELEASE_TABLET | Freq: Two times a day (BID) | ORAL | Status: AC
  Administered 2017-11-09 – 2017-11-10 (×3): 20 meq via ORAL
  Filled 2017-11-09 (×3): qty 1

## 2017-11-09 NOTE — Progress Notes (Signed)
Progress Note  Patient Name: Mandy Rhodes Date of Encounter: 11/09/2017  Primary Cardiologist: No primary care provider on file.   Subjective   Patient reports breathing has improved. Denies CP, orthopnea, abdominal pain.   Inpatient Medications    Scheduled Meds: . aspirin EC  81 mg Oral Daily  . furosemide  160 mg Oral BID  . insulin aspart  0-9 Units Subcutaneous TID WC  . metoprolol tartrate  12.5 mg Oral BID  . multivitamin with minerals  1 tablet Oral q morning - 10a  . potassium chloride  20 mEq Oral BID  . sodium chloride flush  3 mL Intravenous Q12H   Continuous Infusions: . sodium chloride    . cefTRIAXone (ROCEPHIN)  IV Stopped (11/08/17 2354)  . ferumoxytol     PRN Meds: sodium chloride, acetaminophen, sodium chloride flush   Vital Signs    Vitals:   11/08/17 0759 11/08/17 1215 11/08/17 2100 11/09/17 0551  BP:  (!) 100/56 102/75 117/66  Pulse: 64 64 65 66  Resp:  12 18 16   Temp:  97.7 F (36.5 C) 97.8 F (36.6 C) 98 F (36.7 C)  TempSrc:  Oral Oral Oral  SpO2:  93% 93% 100%  Weight:    228 lb 9.9 oz (103.7 kg)  Height:        Intake/Output Summary (Last 24 hours) at 11/09/2017 1112 Last data filed at 11/09/2017 0901 Gross per 24 hour  Intake 480 ml  Output 350 ml  Net 130 ml   Filed Weights   11/07/17 0300 11/08/17 0503 11/09/17 0551  Weight: 235 lb 10.8 oz (106.9 kg) 225 lb 1.4 oz (102.1 kg) 228 lb 9.9 oz (103.7 kg)    Telemetry    NSR, occasional PVCs, rate in the 60s - Personally Reviewed   Physical Exam   GENERAL:  Well appearing, elderly BF in NAD HEENT:  PERRL, EOMI, sclera are clear. Oropharynx is clear. NECK:  + JVD, carotid upstroke brisk and symmetric, no bruits, no thyromegaly or adenopathy LUNGS:  Left basilar rales.  CHEST:  Unremarkable HEART:  RRR,  PMI not displaced or sustained,S1 and S2 within normal limits, no S3, no S4: no clicks, no rubs, no murmurs ABD:  Soft, nontender. BS +, no masses or bruits. No  hepatomegaly, no splenomegaly EXT:  2 + pulses throughout, 1-2+ edema, no cyanosis no clubbing SKIN:  Warm and dry.  No rashes NEURO:  Alert and oriented x 3. Cranial nerves II through XII intact. PSYCH:  Cognitively intact    Labs    Chemistry Recent Labs  Lab 11/07/17 0438 11/08/17 0901 11/09/17 0630  NA 143 142 141  K 3.7 3.5 3.4*  CL 107 106 105  CO2 23 28 25   GLUCOSE 124* 126* 102*  BUN 47* 43* 43*  CREATININE 2.43* 2.55* 2.25*  CALCIUM 9.7 9.6 9.6  ALBUMIN  --   --  3.1*  GFRNONAA 18* 17* 20*  GFRAA 21* 20* 23*  ANIONGAP 13 8 11      Hematology Recent Labs  Lab 11/06/17 0132 11/07/17 0438 11/08/17 0901  WBC 3.1* 3.2* 3.6*  RBC 4.06 3.87 3.83*  HGB 12.4 11.5* 11.4*  HCT 36.0 34.1* 33.6*  MCV 88.7 88.1 87.7  MCH 30.5 29.7 29.8  MCHC 34.4 33.7 33.9  RDW 16.6* 16.5* 16.3*  PLT 80* 79* 87*    Cardiac Enzymes Recent Labs  Lab 11/05/17 2210 11/06/17 0132 11/06/17 0322  TROPONINI 0.37* 0.35* 0.34*    Recent Labs  Lab 11/05/17 1922  TROPIPOC 0.34*     BNP Recent Labs  Lab 11/05/17 1916 11/07/17 1353  BNP 1,651.6* 1,122.8*     DDimer No results for input(s): DDIMER in the last 168 hours.   Radiology    No results found.  Cardiac Studies   ECHO 11/06/17: Impressions:  - Compared to a prior study in 09/2016, the LVEF is lower at 25-30% with severe LV wall thickness and global hypokinesis. LV filling pressures are elevated and there is a left pleural effusion.   ECHO:10/13/2016  Study Conclusions  - Left ventricle: The cavity size was normal. There was moderate concentric hypertrophy. The appearance was consistent with hypertensive changes or infiltrative cardiomyopathy, such as amyloidosis. Systolic function was moderately to severely reduced. The estimated ejection fraction was in the range of 30% to 35%. Moderate diffuse hypokinesis with no identifiable regional variations. Features are consistent with a  pseudonormal left ventricular filling pattern, with concomitant abnormal relaxation and increased filling pressure (grade 2 diastolic dysfunction). - Mitral valve: Valve area by pressure half-time: 2.42 cm^2. - Left atrium: The atrium was moderately dilated. - Pulmonary arteries: Systolic pressure was mildly increased. PA peak pressure: 39 mm Hg (S).  CATH:05/03/207  Prox LAD lesion, 20% stenosed.  Ost Ramus to Ramus lesion, 10% stenosed.  Minor nonobstructive CAD  Plan: medical management.    Patient Profile     76 y.o. female with a hx of combined systolic and diastolic CHF (EF at 50-09% per echo 09/2016), nonischemic cardiomyopathy (per cath 03/2016 with 20% prox LAD), HTN, HLD, DM II, CKD,and CADwho is being seen today for the evaluation ofincreasing SOBat the request of Dr. Tamala Julian.   Assessment & Plan    1. Chronic combined systolic and diastolic CHF: - Troponin peaked at 0.47 - most likely related to demand ischemia - Echo 11/06/17 revealed an EF of 25-30%, down from 30-35% 2017 - Baseline weight 205lb in office - down to 228 lb today  - She is negative 0.7 L since admission. Not sure I/O complete - Per Renal switched to Lasix orally 160 mg bid. Bidil discontinued to allow for higher BP. Little else to offer to improve course of her cardiomyopathy. - Continue  ASA, BB - Strict I & O, daily weight - Not a good candidate for ACE/ARB due to CKD  2. Nonischemic cardiomyopathy: - Known EF 25-30%  Per Echo - Cardiac cath 03/2016, non-obstructive CAD and to medically manage - Likely infiltrative process with severe LVH - Continue ASA, BB - May want to consider Cardiac MRI to work up infiltrative disease.  - ? If this could be amyloid. Will check SPEP- last checked in 2012.  3. HTN: - BP stable - Continue BB only per Renal.   4. Acute on chronic Stage 3 CKD: baseline Cr 1.3-2.0 - Creatinine  trended up, 2.43>>2.55. Now 2.25.     For questions or  updates, please contact [place] Please consult [email or web address] for contact info under Cardiology/STEMI.      Signed,  Peter Martinique, [place] 11/09/2017 11:12 AM

## 2017-11-09 NOTE — Progress Notes (Signed)
Patient ID: Mandy Rhodes, female   DOB: 1941/06/29, 76 y.o.   MRN: [id number] [facility] Progress Note   Assessment/ Plan:   1. AKI on chronic kidney disease stage III/4 versus progression of chronic kidney disease: Appears to have had progression over the past year likely from chronic cardiorenal mechanisms. She was readmitted again with acute exacerbation of congestive heart failure and is currently undergoing diuresis. Some improvement of renal function noted overnight however charted urine output appears unimpressive with weight gain of 1 kg overnight. Albumin of 3.1 maybe contributing to her diuretic resistance and may require transition to torsemide at some point if worsens. 2. Hypokalemia: Secondary to diuretic induced wasting, replace via oral route. 3. Acute CHF exacerbation: Optimizing diuretic therapy at this time. Agree with decreasing antihypertensive dosing to allow for improved renal perfusion. 4. Anemia of chronic kidney disease: Low iron saturation of 19%, will prescribe for intravenous iron prior to initiation of ESA. 5. Secondary hyperparathyroidism: Phosphorus levels within acceptable limits, PTH pending  Subjective:   Reports to be feeling better and states that she urinated much more than was charted and it was likely inaccurately quantified because of some leakage around the collection bin/incontinence.    Objective:   BP 117/66 (BP Location: Right Arm)   Pulse 66   Temp 98 F (36.7 C) (Oral)   Resp 16   Ht 5\' 7"  (1.702 m)   Wt 103.7 kg (228 lb 9.9 oz)   SpO2 100%   BMI 35.81 kg/m   Intake/Output Summary (Last 24 hours) at 11/09/2017 0855 Last data filed at 11/08/2017 2155 Gross per 24 hour  Intake 480 ml  Output 350 ml  Net 130 ml   Weight change: 1.6 kg (3 lb 8.4 oz)  Physical Exam: Gen: Comfortably resting in bed, daughter at bedside CVS: Pulse regular rhythm, S1 and S2 with no rubs Resp: Diminished breath sounds over bases with fine  rales midlung fields Abd: Soft, obese, nontender Ext: 3+ pitting lower extremity edema  Imaging: No results found.  Labs: BMET Recent Labs  Lab 11/05/17 1916 11/06/17 0132 11/07/17 0438 11/08/17 0901 11/09/17 0630  NA 143 142 143 142 141  K 3.9 3.8 3.7 3.5 3.4*  CL 108 109 107 106 105  CO2 26 23 23 28 25   GLUCOSE 114* 98 124* 126* 102*  BUN 47* 45* 47* 43* 43*  CREATININE 2.51* 2.30* 2.43* 2.55* 2.25*  CALCIUM 10.0 9.6 9.7 9.6 9.6  PHOS  --   --   --   --  3.3   CBC Recent Labs  Lab 11/05/17 1916 11/06/17 0132 11/07/17 0438 11/08/17 0901  WBC 3.5* 3.1* 3.2* 3.6*  NEUTROABS 2.0 1.6* 1.4* 2.0  HGB 13.0 12.4 11.5* 11.4*  HCT 37.9 36.0 34.1* 33.6*  MCV 89.0 88.7 88.1 87.7  PLT 94* 80* 79* 87*    Medications:    . aspirin EC  81 mg Oral Daily  . ferrous sulfate  325 mg Oral Daily  . furosemide  160 mg Oral BID  . insulin aspart  0-9 Units Subcutaneous TID WC  . metoprolol tartrate  12.5 mg Oral BID  . multivitamin with minerals  1 tablet Oral q morning - 10a  . sodium chloride flush  3 mL Intravenous Q12H   Elmarie Shiley, MD 11/09/2017, 8:55 AM

## 2017-11-09 NOTE — Progress Notes (Signed)
Physical Therapy Treatment Patient Details Name: Mandy Rhodes MRN: [id number] DOB: 08/18/1941 Today's Date: 11/09/2017    History of Present Illness Pt is a 76 y.o. female admitted 11/05/17 with c/o progressively worsening SOB; CXR showed cardiomegaly with left pleural effusion. Also suspected PNA. Marland Kitchen Pertinent PMH includes NSTEMI/VDRF 11/17, ACDF w/ hardware removal, DM, CHF, CKD, HTN, anemia, R rotator cuff repair, neuropathy.     PT Comments    Patient making slow gains with mobility.  Patient declining SNF at d/c.  Recommend f/u HHPT and Aide.  Will continue to follow.   Follow Up Recommendations  SNF;Home health PT;Supervision/Assistance - 24 hour(Patient declining SNF)     Equipment Recommendations  None recommended by PT    Recommendations for Other Services       Precautions / Restrictions Precautions Precautions: Fall Restrictions Weight Bearing Restrictions: No    Mobility  Bed Mobility Overal bed mobility: Needs Assistance Bed Mobility: Supine to Sit;Sit to Supine     Supine to sit: Max assist Sit to supine: Mod assist   General bed mobility comments: Verbal cues for technique.  Assist to bring LE's off of bed, and to raise trunk to sitting.  Once upright, patient able to maintain sitting balance.  Patient able to complete LE exercises in sitting with good balance.  Returned to sidelying > supine with mod assist.  Placed bed in trendelenberg and assisted patient to [facility].  Transfers                 General transfer comment: Patient declined today.  Ambulation/Gait                 Stairs            Wheelchair Mobility    Modified Rankin (Stroke Patients Only)       Balance     Sitting balance-Leahy Scale: Good                                      Cognition Arousal/Alertness: Awake/alert Behavior During Therapy: WFL for tasks assessed/performed Overall Cognitive Status: Within Functional Limits for tasks  assessed                                        Exercises General Exercises - Lower Extremity Ankle Circles/Pumps: AROM;Both;10 reps;Seated Long Arc Quad: AROM;Both;10 reps;Seated Hip ABduction/ADduction: AROM;Both;10 reps;Seated Hip Flexion/Marching: AROM;Both;5 reps;Seated Toe Raises: AROM;Both;10 reps;Seated Heel Raises: AROM;Both;10 reps;Seated Other Exercises Other Exercises: Ankle circles with LE's in extension, 10 reps in sitting    General Comments General comments (skin integrity, edema, etc.): Edema, RLE > LLE      Pertinent Vitals/Pain Pain Assessment: No/denies pain    Home Living                      Prior Function            PT Goals (current goals can now be found in the care plan section) Acute Rehab PT Goals Patient Stated Goal: Return home ASAP Progress towards PT goals: Progressing toward goals    Frequency    Min 3X/week      PT Plan Current plan remains appropriate    Co-evaluation              AM-PAC PT "6 Clicks"  Daily Activity  Outcome Measure  Difficulty turning over in bed (including adjusting bedclothes, sheets and blankets)?: Unable Difficulty moving from lying on back to sitting on the side of the bed? : Unable Difficulty sitting down on and standing up from a chair with arms (e.g., wheelchair, bedside commode, etc,.)?: Unable Help needed moving to and from a bed to chair (including a wheelchair)?: A Lot Help needed walking in hospital room?: A Lot Help needed climbing 3-5 steps with a railing? : Total 6 Click Score: 8    End of Session   Activity Tolerance: Patient tolerated treatment well;Patient limited by fatigue Patient left: in bed;with call bell/phone within reach;with nursing/sitter in room Nurse Communication: Mobility status PT Visit Diagnosis: Other abnormalities of gait and mobility (R26.89);Muscle weakness (generalized) (M62.81)     Time: 3267-1245 PT Time Calculation (min)  (ACUTE ONLY): 23 min  Charges:  $Therapeutic Activity: 23-37 mins                    G Codes:       Carita Pian. Sanjuana Kava, [facility] Acute Rehab Services Pager [place] 11/09/2017, 5:19 PM

## 2017-11-09 NOTE — Progress Notes (Signed)
Pharmacist Heart Failure Core Measure Documentation  Assessment: Mandy Rhodes has an EF documented as 25-30% on 11/06/17 by 2D echo.  Rationale: Heart failure patients with left ventricular systolic dysfunction (LVSD) and an EF < 40% should be prescribed an angiotensin converting enzyme inhibitor (ACEI) or angiotensin receptor blocker (ARB) at discharge unless a contraindication is documented in the medical record.  This patient is not currently on an ACEI or ARB for HF.  This note is being placed in the record in order to provide documentation that a contraindication to the use of these agents is present for this encounter.  ACE Inhibitor or Angiotensin Receptor Blocker is contraindicated (specify all that apply)  []   ACEI allergy AND ARB allergy []   Angioedema []   Moderate or severe aortic stenosis []   Hyperkalemia [x]   Hypotension []   Renal artery stenosis [x]   Worsening renal function, preexisting renal disease or dysfunction   Thersa Mohiuddin 11/09/2017 2:48 PM

## 2017-11-09 NOTE — Progress Notes (Signed)
PROGRESS NOTE [facility]   LAMIAH Rhodes   HMC:[id number] DOB: [birth year]  DOA: 11/05/2017 PCP: Harlan Stains, MD   Brief Narrative:  Mandy Rhodes is a 76 y.o. female with medical history significant of systolic and diastolic CHF last EF noted to be 30-35% with grade 2 dFx 10/13/2016, DM type II, CKD stage III, nonobstructing CAD; who presents with complaints of progressively worsening shortness of breath for last 1 week. Upon ED evaluation CXR showed cardiomegaly with increase in vascular congestion and left pleura effusion. Also there was suspected PNA so patient was started on empiric. Cardiology consulted.   Subjective: Patient continues to improves, denies CP,SOB and palpitations. No acute events over night    Assessment & Plan: Acute on Chronic combine systolic and diastolic CHF  EF 66-29% in 09/2016 ECHO EF reduced 10% previous 30-35%, new 25-30% with G2DD   Unable to use ACE/ARBs and aldactone due to CKD  I&Os and weights continues to be inaccurate - patient clinically improving  Lasix switched to PO by renal and Bidil d/ced to allow kidney perfusion.  Still with some edema and mild JVD  Continue to monitor   Pneumonia Left base infiltrate on CXR - Improved  Strep pneumo antigen positive  Continue Rocephin for now treat for total of 5 days of abx, can switch to oral abx prior to d/c  Procalcitonin negative    Elevated troponin Likely demand ischemia from CHF  Cards recommendations appreciated  No further ischemic work up needed   AKI on CKD stage IV - ? Cardiorenal, poor UOP  Baseline ~ Cr 1.9 Progressive kidney disease in setting of diuresis and low BP  Renal recommendations appreciated  Checking SPEP   HTN  BP stable  Continue current regimen   DM type 2  CBG's remains stable  Hold oral agents  Continue SSI   DVT prophylaxis: SCD Code Status: Full Code  Family Communication: None at bedside  Disposition Plan: Home in the next 24-48 hrs    Consultants:   Cardiology   Procedures:   ECHO - pending   Antimicrobials: Anti-infectives (From admission, onward)   Start     Dose/Rate Route Frequency Ordered Stop   11/06/17 2200  cefTRIAXone (ROCEPHIN) 1 g in dextrose 5 % 50 mL IVPB     1 g 100 mL/hr over 30 Minutes Intravenous Every 24 hours 11/05/17 2211 11/12/17 2159   11/06/17 2200  azithromycin (ZITHROMAX) 500 mg in dextrose 5 % 250 mL IVPB  Status:  Discontinued     500 mg 250 mL/hr over 60 Minutes Intravenous Every 24 hours 11/05/17 2211 11/06/17 0632   11/05/17 2015  cefTRIAXone (ROCEPHIN) 1 g in dextrose 5 % 50 mL IVPB     1 g 100 mL/hr over 30 Minutes Intravenous  Once 11/05/17 2014 11/05/17 2131   11/05/17 2015  azithromycin (ZITHROMAX) 500 mg in dextrose 5 % 250 mL IVPB     500 mg 250 mL/hr over 60 Minutes Intravenous  Once 11/05/17 2014 11/05/17 2208          Objective: Vitals:   11/08/17 1215 11/08/17 2100 11/09/17 0551 11/09/17 1152  BP: (!) 100/56 102/75 117/66 115/70  Pulse: 64 65 66 61  Resp: 12 18 16 18   Temp: 97.7 F (36.5 C) 97.8 F (36.6 C) 98 F (36.7 C) (!) 97.5 F (36.4 C)  TempSrc: Oral Oral Oral Oral  SpO2: 93% 93% 100% 98%  Weight:   103.7 kg (228 lb 9.9 oz)  Height:        Intake/Output Summary (Last 24 hours) at 11/09/2017 1706 Last data filed at 11/09/2017 1431 Gross per 24 hour  Intake 240 ml  Output 350 ml  Net -110 ml   Filed Weights   11/07/17 0300 11/08/17 0503 11/09/17 0551  Weight: 106.9 kg (235 lb 10.8 oz) 102.1 kg (225 lb 1.4 oz) 103.7 kg (228 lb 9.9 oz)    Examination:  General: NAD  Cardiovascular: RRR S1S2 mild JVD  Respiratory: Good air entry improving  Abdominal: Soft, NT, ND, bowel sounds + Extremities: b/l LE edema 1+, Left side hemiparesis   Data Reviewed: I have personally reviewed following labs and imaging studies  CBC: Recent Labs  Lab 11/05/17 1916 11/06/17 0132 11/07/17 0438 11/08/17 0901  WBC 3.5* 3.1* 3.2* 3.6*  NEUTROABS 2.0  1.6* 1.4* 2.0  HGB 13.0 12.4 11.5* 11.4*  HCT 37.9 36.0 34.1* 33.6*  MCV 89.0 88.7 88.1 87.7  PLT 94* 80* 79* 87*   Basic Metabolic Panel: Recent Labs  Lab 11/05/17 1916 11/06/17 0132 11/07/17 0438 11/08/17 0901 11/09/17 0630  NA 143 142 143 142 141  K 3.9 3.8 3.7 3.5 3.4*  CL 108 109 107 106 105  CO2 26 23 23 28 25   GLUCOSE 114* 98 124* 126* 102*  BUN 47* 45* 47* 43* 43*  CREATININE 2.51* 2.30* 2.43* 2.55* 2.25*  CALCIUM 10.0 9.6 9.7 9.6 9.6  PHOS  --   --   --   --  3.3   GFR: Estimated Creatinine Clearance: 26.3 mL/min (A) (by C-G formula based on SCr of 2.25 mg/dL (H)). Liver Function Tests: Recent Labs  Lab 11/09/17 0630  ALBUMIN 3.1*   No results for input(s): LIPASE, AMYLASE in the last 168 hours. No results for input(s): AMMONIA in the last 168 hours. Coagulation Profile: No results for input(s): INR, PROTIME in the last 168 hours. Cardiac Enzymes: Recent Labs  Lab 11/05/17 2210 11/06/17 0132 11/06/17 0322  TROPONINI 0.37* 0.35* 0.34*   BNP (last 3 results) No results for input(s): PROBNP in the last 8760 hours. HbA1C: No results for input(s): HGBA1C in the last 72 hours. CBG: Recent Labs  Lab 11/08/17 1702 11/08/17 2059 11/09/17 0718 11/09/17 1150 11/09/17 1633  GLUCAP 142* 155* 91 215* 187*   Lipid Profile: No results for input(s): CHOL, HDL, LDLCALC, TRIG, CHOLHDL, LDLDIRECT in the last 72 hours. Thyroid Function Tests: No results for input(s): TSH, T4TOTAL, FREET4, T3FREE, THYROIDAB in the last 72 hours. Anemia Panel: Recent Labs    11/09/17 0636  TIBC 274  IRON 52   Sepsis Labs: Recent Labs  Lab 11/06/17 2001  PROCALCITON <0.10    Recent Results (from the past 240 hour(s))  Culture, blood (routine x 2) Call MD if unable to obtain prior to antibiotics being given     Status: None (Preliminary result)   Collection Time: 11/05/17 10:31 PM  Result Value Ref Range Status   Specimen Description BLOOD LEFT ANTECUBITAL  Final    Special Requests   Final    BOTTLES DRAWN AEROBIC AND ANAEROBIC Blood Culture adequate volume   Culture NO GROWTH 3 DAYS  Final   Report Status PENDING  Incomplete  Culture, blood (routine x 2) Call MD if unable to obtain prior to antibiotics being given     Status: None (Preliminary result)   Collection Time: 11/06/17  2:26 AM  Result Value Ref Range Status   Specimen Description BLOOD LEFT FOREARM  Final   Special  Requests   Final    BOTTLES DRAWN AEROBIC AND ANAEROBIC Blood Culture adequate volume   Culture NO GROWTH 3 DAYS  Final   Report Status PENDING  Incomplete     Radiology Studies: No results found.  Scheduled Meds: . aspirin EC  81 mg Oral Daily  . furosemide  160 mg Oral BID  . insulin aspart  0-9 Units Subcutaneous TID WC  . metoprolol tartrate  12.5 mg Oral BID  . multivitamin with minerals  1 tablet Oral q morning - 10a  . potassium chloride  20 mEq Oral BID  . sodium chloride flush  3 mL Intravenous Q12H   Continuous Infusions: . sodium chloride    . cefTRIAXone (ROCEPHIN)  IV Stopped (11/08/17 2354)     LOS: 4 days    Time spent: Total of 25 minutes spent with pt, greater than 50% of which was spent in discussion of  treatment, counseling and coordination of care  Chipper Oman, MD Pager: Text Page via [email or web address]   If 7PM-7AM, please contact night-coverage [email or web address] 11/09/2017, 5:06 PM

## 2017-11-10 DIAGNOSIS — I5040 Unspecified combined systolic (congestive) and diastolic (congestive) heart failure: Secondary | ICD-10-CM

## 2017-11-10 LAB — GLUCOSE, CAPILLARY
GLUCOSE-CAPILLARY: 85 mg/dL (ref 65–99)
GLUCOSE-CAPILLARY: 118 mg/dL — AB (ref 65–99)
GLUCOSE-CAPILLARY: 146 mg/dL — AB (ref 65–99)
Glucose-Capillary: 108 mg/dL — ABNORMAL HIGH (ref 65–99)

## 2017-11-10 LAB — RENAL FUNCTION PANEL
Albumin: 3.1 g/dL — ABNORMAL LOW (ref 3.5–5.0)
Anion gap: 10 (ref 5–15)
BUN: 37 mg/dL — AB (ref 6–20)
CHLORIDE: 103 mmol/L (ref 101–111)
CO2: 27 mmol/L (ref 22–32)
Calcium: 9.7 mg/dL (ref 8.9–10.3)
Creatinine, Ser: 2.11 mg/dL — ABNORMAL HIGH (ref 0.44–1.00)
GFR calc Af Amer: 25 mL/min — ABNORMAL LOW (ref >60)
GFR, EST NON AFRICAN AMERICAN: 22 mL/min — AB (ref >60)
Glucose, Bld: 90 mg/dL (ref 65–99)
POTASSIUM: 3.6 mmol/L (ref 3.5–5.1)
Phosphorus: 3.2 mg/dL (ref 2.5–4.6)
Sodium: 140 mmol/L (ref 135–145)

## 2017-11-10 LAB — PARATHYROID HORMONE, INTACT (NO CA): PTH: 111 pg/mL — ABNORMAL HIGH (ref 15–65)

## 2017-11-10 LAB — MAGNESIUM: MAGNESIUM: 2.1 mg/dL (ref 1.7–2.4)

## 2017-11-10 MED ORDER — POTASSIUM CHLORIDE CRYS ER 20 MEQ PO TBCR
20.0000 meq | EXTENDED_RELEASE_TABLET | Freq: Two times a day (BID) | ORAL | Status: AC
  Administered 2017-11-10 – 2017-11-11 (×3): 20 meq via ORAL
  Filled 2017-11-10 (×3): qty 1

## 2017-11-10 MED ORDER — CALCITRIOL 0.25 MCG PO CAPS: 0.2500 ug | ORAL_CAPSULE | ORAL | Status: DC

## 2017-11-10 NOTE — Progress Notes (Addendum)
Progress Note  Patient Name: Mandy Rhodes Date of Encounter: 11/10/2017  Primary Cardiologist: No primary care provider on file. Dr. Debara Pickett  Subjective   Still short of breath but breathing has improved.  No chest pain.  Inpatient Medications    Scheduled Meds: . aspirin EC  81 mg Oral Daily  . [START ON 11/12/2017] calcitRIOL  0.25 mcg Oral Q M,W,F  . furosemide  160 mg Oral BID  . insulin aspart  0-9 Units Subcutaneous TID WC  . metoprolol tartrate  12.5 mg Oral BID  . multivitamin with minerals  1 tablet Oral q morning - 10a  . potassium chloride  20 mEq Oral BID  . sodium chloride flush  3 mL Intravenous Q12H   Continuous Infusions: . sodium chloride    . cefTRIAXone (ROCEPHIN)  IV Stopped (11/09/17 2245)   PRN Meds: sodium chloride, acetaminophen, sodium chloride flush   Vital Signs    Vitals:   11/09/17 1152 11/09/17 2035 11/10/17 0500 11/10/17 0549  BP: 115/70 109/71  115/69  Pulse: 61 66  69  Resp: 18 18  18   Temp: (!) 97.5 F (36.4 C) 98 F (36.7 C)  98 F (36.7 C)  TempSrc: Oral Oral  Oral  SpO2: 98% 96%  95%  Weight:   226 lb 3.1 oz (102.6 kg)   Height:        Intake/Output Summary (Last 24 hours) at 11/10/2017 1019 Last data filed at 11/10/2017 6283 Gross per 24 hour  Intake -  Output 2050 ml  Net -2050 ml   Filed Weights   11/08/17 0503 11/09/17 0551 11/10/17 0500  Weight: 225 lb 1.4 oz (102.1 kg) 228 lb 9.9 oz (103.7 kg) 226 lb 3.1 oz (102.6 kg)    Telemetry    Normal sinus rhythm, PVCs, well rate controlled- Personally Reviewed  ECG    No new EKG- Personally Reviewed  Physical Exam   GEN: No acute distress.   Neck:  Mid neck JVD Cardiac: RRR, no murmurs, rubs, or gallops.  Respiratory: Clear to auscultation bilaterally. GI: Soft, nontender, non-distended  MS:  2+ bilateral lower extremity edema; No deformity. Neuro:  Nonfocal  Psych: Normal affect   Labs    Chemistry Recent Labs  Lab 11/08/17 0901 11/09/17 0630  11/10/17 0634  NA 142 141 140  K 3.5 3.4* 3.6  CL 106 105 103  CO2 28 25 27   GLUCOSE 126* 102* 90  BUN 43* 43* 37*  CREATININE 2.55* 2.25* 2.11*  CALCIUM 9.6 9.6 9.7  ALBUMIN  --  3.1* 3.1*  GFRNONAA 17* 20* 22*  GFRAA 20* 23* 25*  ANIONGAP 8 11 10      Hematology Recent Labs  Lab 11/06/17 0132 11/07/17 0438 11/08/17 0901  WBC 3.1* 3.2* 3.6*  RBC 4.06 3.87 3.83*  HGB 12.4 11.5* 11.4*  HCT 36.0 34.1* 33.6*  MCV 88.7 88.1 87.7  MCH 30.5 29.7 29.8  MCHC 34.4 33.7 33.9  RDW 16.6* 16.5* 16.3*  PLT 80* 79* 87*    Cardiac Enzymes Recent Labs  Lab 11/05/17 2210 11/06/17 0132 11/06/17 0322  TROPONINI 0.37* 0.35* 0.34*    Recent Labs  Lab 11/05/17 1922  TROPIPOC 0.34*     BNP Recent Labs  Lab 11/05/17 1916 11/07/17 1353  BNP 1,651.6* 1,122.8*     DDimer No results for input(s): DDIMER in the last 168 hours.   Radiology    No results found.  Cardiac Studies   Echocardiogram 11/06/17: -EF 25-30% with severe  LV hypertrophy.  Cardiac catheterization 03/22/16 -Minor nonobstructive CAD  Patient Profile     76 y.o. female combined systolic and diastolic heart failure, nonischemic cardiomyopathy, hypertension, hyperlipidemia, diabetes, chronic kidney disease stage IV, nonobstructive CAD  Assessment & Plan    Acute on chronic systolic and diastolic heart failure -High-dose Lasix.  Appreciate Dr. Posey Pronto, nephrology suggestions.  -2.8 L out. -Difficult to utilize traditional dosing of heart failure medications because of her overall hypotension.  Hypertensive heart disease with heart failure -Severe LVH noted on echocardiogram.  Likely hypertensive cardiomyopathy. - I do like the previous thought of considering amyloidosis.  Although, hypertrophy is likely indicative of underlying hypertension.  Chronic kidney disease stage IV -Creatinine 2.2.  Previous baseline was 2.0.  Dr. Serita Grit note reviewed. Creat trending back down.   ? Home tomorrow  For  questions or updates, please contact [place] Please consult [email or web address] for contact info under Cardiology/STEMI.      Signed, Candee Furbish, MD  11/10/2017, 10:19 AM

## 2017-11-10 NOTE — Progress Notes (Signed)
Pt is stable, vitals stable, sitting up in a wheelchair this evening for dinner, no any complain of CP and denies SOB. Diuresing well, tolerating medicine and food, Will continue to monitor the patient

## 2017-11-10 NOTE — Progress Notes (Signed)
Patient ID: Mandy Rhodes, female   DOB: 09-26-1941, 76 y.o.   MRN: [id number] [facility] Progress Note   Assessment/ Plan:   1. AKI on chronic kidney disease stage III/4 versus progression of chronic kidney disease: Appears to have had progression over the past year likely from chronic cardiorenal mechanisms.  She shows good response to adjustment of diuretic therapy-furosemide 160 mg twice a day and renal function continues to improve with slow but gradual downtrend of her creatinine. 2. Hypokalemia: Secondary to diuretic induced wasting, improved with oral replacement-we will continue daily replacement. 3. Acute CHF exacerbation: Optimizing diuretic therapy at this time. Agree with decreasing antihypertensive dosing to allow for improved renal perfusion.  Remains hypervolemic but this is gradually expected to improve on current diuretic dose. 4. Anemia of chronic kidney disease: Low iron saturation of 19%, status post intravenous Fereheme. 5. Secondary hyperparathyroidism: Phosphorus levels within acceptable limits, PTH level elevated for chronic kidney disease stage IV-begin calcitriol  Subjective:   Reports that she continues to feel better and looking forward to walking around today to see how she does with tolerance to exertion.    Objective:   BP 115/69 (BP Location: Left Arm)   Pulse 69   Temp 98 F (36.7 C) (Oral)   Resp 18   Ht 5\' 7"  (1.702 m)   Wt 102.6 kg (226 lb 3.1 oz)   SpO2 95%   BMI 35.43 kg/m   Intake/Output Summary (Last 24 hours) at 11/10/2017 8527 Last data filed at 11/10/2017 7824 Gross per 24 hour  Intake -  Output 2050 ml  Net -2050 ml   Weight change: -1.1 kg (-6.8 oz)  Physical Exam: Gen: Comfortably resting in bed, daughter at bedside CVS: Pulse regular rhythm, S1 and S2 with no rubs Resp: Diminished breath sounds over bases with fine rales midlung fields Abd: Soft, obese, nontender Ext: 3+ pitting lower extremity  edema  Imaging: No results found.  Labs: BMET Recent Labs  Lab 11/05/17 1916 11/06/17 0132 11/07/17 0438 11/08/17 0901 11/09/17 0630 11/10/17 0634  NA 143 142 143 142 141 140  K 3.9 3.8 3.7 3.5 3.4* 3.6  CL 108 109 107 106 105 103  CO2 26 23 23 28 25 27   GLUCOSE 114* 98 124* 126* 102* 90  BUN 47* 45* 47* 43* 43* 37*  CREATININE 2.51* 2.30* 2.43* 2.55* 2.25* 2.11*  CALCIUM 10.0 9.6 9.7 9.6 9.6 9.7  PHOS  --   --   --   --  3.3 3.2   CBC Recent Labs  Lab 11/05/17 1916 11/06/17 0132 11/07/17 0438 11/08/17 0901  WBC 3.5* 3.1* 3.2* 3.6*  NEUTROABS 2.0 1.6* 1.4* 2.0  HGB 13.0 12.4 11.5* 11.4*  HCT 37.9 36.0 34.1* 33.6*  MCV 89.0 88.7 88.1 87.7  PLT 94* 80* 79* 87*    Medications:    . aspirin EC  81 mg Oral Daily  . furosemide  160 mg Oral BID  . insulin aspart  0-9 Units Subcutaneous TID WC  . metoprolol tartrate  12.5 mg Oral BID  . multivitamin with minerals  1 tablet Oral q morning - 10a  . sodium chloride flush  3 mL Intravenous Q12H   Elmarie Shiley, MD 11/10/2017, 9:26 AM

## 2017-11-10 NOTE — Progress Notes (Signed)
PROGRESS NOTE [facility]   Mandy Rhodes   JIR:[id number] DOB: 09-Aug-1941  DOA: 11/05/2017 PCP: Harlan Stains, MD   Brief Narrative:  Mandy Rhodes is a 76 y.o. female with medical history significant of systolic and diastolic CHF last EF noted to be 30-35% with grade 2 dFx 10/13/2016, DM type II, CKD stage III, nonobstructing CAD; who presents with complaints of progressively worsening shortness of breath for last 1 week. Upon ED evaluation CXR showed cardiomegaly with increase in vascular congestion and left pleura effusion. Also there was suspected PNA so patient was started on empiric. Cardiology consulted started on IV Lasix, subsequently Scr increase significantly. Nephrology was consulted.    Subjective: Breathing improving, continues to have good diuresis, patient feels much better   Assessment & Plan: Acute on Chronic combine systolic and diastolic CHF  EF 75-10% in 09/2016 ECHO EF reduced 10% previous 30-35%, new 25-30% with G2DD  ? Amyloid  Unable to use ACE/ARBs and aldactone due to CKD  Clinically improving  Lasix switched to PO by renal and Bidil d/ced to allow kidney perfusion.  Possible home in AM   Pneumonia Left base infiltrate on CXR  Strep pneumo antigen positive  Complete course of Rocephin  Procalcitonin negative    Elevated troponin Likely demand ischemia from CHF  Cards recommendations appreciated  No further ischemic work up needed   AKI on CKD stage IV - ? Cardiorenal, poor UOP  Baseline ~ Cr 1.9 Progressive kidney disease in setting of diuresis and low BP  SPEP pending  Cr improved after medication adjustment, monitor Cr in AM if stable d/c home   HTN  BP stable  Continue current regimen   DM type 2  CBG's remains stable  Hold oral agents  Continue SSI   DVT prophylaxis: SCD Code Status: Full Code  Family Communication: None at bedside  Disposition Plan: Home in AM if Cr stable    Consultants:   Cardiology    Procedures:   ECHO - pending   Antimicrobials: Anti-infectives (From admission, onward)   Start     Dose/Rate Route Frequency Ordered Stop   11/06/17 2200  cefTRIAXone (ROCEPHIN) 1 g in dextrose 5 % 50 mL IVPB     1 g 100 mL/hr over 30 Minutes Intravenous Every 24 hours 11/05/17 2211 11/12/17 2159   11/06/17 2200  azithromycin (ZITHROMAX) 500 mg in dextrose 5 % 250 mL IVPB  Status:  Discontinued     500 mg 250 mL/hr over 60 Minutes Intravenous Every 24 hours 11/05/17 2211 11/06/17 0632   11/05/17 2015  cefTRIAXone (ROCEPHIN) 1 g in dextrose 5 % 50 mL IVPB     1 g 100 mL/hr over 30 Minutes Intravenous  Once 11/05/17 2014 11/05/17 2131   11/05/17 2015  azithromycin (ZITHROMAX) 500 mg in dextrose 5 % 250 mL IVPB     500 mg 250 mL/hr over 60 Minutes Intravenous  Once 11/05/17 2014 11/05/17 2208          Objective: Vitals:   11/10/17 0500 11/10/17 0549 11/10/17 0800 11/10/17 1233  BP:  115/69 110/60 (!) 103/55  Pulse:  69  63  Resp:  18  18  Temp:  98 F (36.7 C)  98.2 F (36.8 C)  TempSrc:  Oral  Oral  SpO2:  95%  98%  Weight: 102.6 kg (226 lb 3.1 oz)     Height:        Intake/Output Summary (Last 24 hours) at 11/10/2017 1608 Last data  filed at 11/10/2017 1234 Gross per 24 hour  Intake -  Output 2450 ml  Net -2450 ml   Filed Weights   11/08/17 0503 11/09/17 0551 11/10/17 0500  Weight: 102.1 kg (225 lb 1.4 oz) 103.7 kg (228 lb 9.9 oz) 102.6 kg (226 lb 3.1 oz)    Examination:  General: NAD  Cardiovascular: RRR S1S2 mild JVD  Respiratory: Good air entry improving  Abdominal: Soft, NT, ND, bowel sounds + Extremities: b/l LE edema 1+, Left side hemiparesis   Data Reviewed: I have personally reviewed following labs and imaging studies  CBC: Recent Labs  Lab 11/05/17 1916 11/06/17 0132 11/07/17 0438 11/08/17 0901  WBC 3.5* 3.1* 3.2* 3.6*  NEUTROABS 2.0 1.6* 1.4* 2.0  HGB 13.0 12.4 11.5* 11.4*  HCT 37.9 36.0 34.1* 33.6*  MCV 89.0 88.7 88.1 87.7  PLT  94* 80* 79* 87*   Basic Metabolic Panel: Recent Labs  Lab 11/06/17 0132 11/07/17 0438 11/08/17 0901 11/09/17 0630 11/10/17 0630 11/10/17 0634  NA 142 143 142 141  --  140  K 3.8 3.7 3.5 3.4*  --  3.6  CL 109 107 106 105  --  103  CO2 23 23 28 25   --  27  GLUCOSE 98 124* 126* 102*  --  90  BUN 45* 47* 43* 43*  --  37*  CREATININE 2.30* 2.43* 2.55* 2.25*  --  2.11*  CALCIUM 9.6 9.7 9.6 9.6  --  9.7  MG  --   --   --   --  2.1  --   PHOS  --   --   --  3.3  --  3.2   GFR: Estimated Creatinine Clearance: 27.9 mL/min (A) (by C-G formula based on SCr of 2.11 mg/dL (H)). Liver Function Tests: Recent Labs  Lab 11/09/17 0630 11/10/17 0634  ALBUMIN 3.1* 3.1*   No results for input(s): LIPASE, AMYLASE in the last 168 hours. No results for input(s): AMMONIA in the last 168 hours. Coagulation Profile: No results for input(s): INR, PROTIME in the last 168 hours. Cardiac Enzymes: Recent Labs  Lab 11/05/17 2210 11/06/17 0132 11/06/17 0322  TROPONINI 0.37* 0.35* 0.34*   BNP (last 3 results) No results for input(s): PROBNP in the last 8760 hours. HbA1C: No results for input(s): HGBA1C in the last 72 hours. CBG: Recent Labs  Lab 11/09/17 1150 11/09/17 1633 11/09/17 2111 11/10/17 0743 11/10/17 1157  GLUCAP 215* 187* 105* 85 118*   Lipid Profile: No results for input(s): CHOL, HDL, LDLCALC, TRIG, CHOLHDL, LDLDIRECT in the last 72 hours. Thyroid Function Tests: No results for input(s): TSH, T4TOTAL, FREET4, T3FREE, THYROIDAB in the last 72 hours. Anemia Panel: Recent Labs    11/09/17 0636  TIBC 274  IRON 52   Sepsis Labs: Recent Labs  Lab 11/06/17 2001  PROCALCITON <0.10    Recent Results (from the past 240 hour(s))  Culture, blood (routine x 2) Call MD if unable to obtain prior to antibiotics being given     Status: None (Preliminary result)   Collection Time: 11/05/17 10:31 PM  Result Value Ref Range Status   Specimen Description BLOOD LEFT ANTECUBITAL   Final   Special Requests   Final    BOTTLES DRAWN AEROBIC AND ANAEROBIC Blood Culture adequate volume   Culture NO GROWTH 4 DAYS  Final   Report Status PENDING  Incomplete  Culture, blood (routine x 2) Call MD if unable to obtain prior to antibiotics being given     Status:  None (Preliminary result)   Collection Time: 11/06/17  2:26 AM  Result Value Ref Range Status   Specimen Description BLOOD LEFT FOREARM  Final   Special Requests   Final    BOTTLES DRAWN AEROBIC AND ANAEROBIC Blood Culture adequate volume   Culture NO GROWTH 4 DAYS  Final   Report Status PENDING  Incomplete     Radiology Studies: No results found.  Scheduled Meds: . aspirin EC  81 mg Oral Daily  . [START ON 11/12/2017] calcitRIOL  0.25 mcg Oral Q M,W,F  . furosemide  160 mg Oral BID  . insulin aspart  0-9 Units Subcutaneous TID WC  . metoprolol tartrate  12.5 mg Oral BID  . multivitamin with minerals  1 tablet Oral q morning - 10a  . potassium chloride  20 mEq Oral BID  . sodium chloride flush  3 mL Intravenous Q12H   Continuous Infusions: . sodium chloride    . cefTRIAXone (ROCEPHIN)  IV Stopped (11/09/17 2245)     LOS: 5 days    Time spent: Total of 15 minutes spent with pt, greater than 50% of which was spent in discussion of  treatment, counseling and coordination of care  Chipper Oman, MD Pager: Text Page via [email or web address]   If 7PM-7AM, please contact night-coverage [email or web address] 11/10/2017, 4:08 PM

## 2017-11-11 LAB — BASIC METABOLIC PANEL
Anion gap: 8 (ref 5–15)
BUN: 40 mg/dL — AB (ref 6–20)
CALCIUM: 9.7 mg/dL (ref 8.9–10.3)
CHLORIDE: 102 mmol/L (ref 101–111)
CO2: 29 mmol/L (ref 22–32)
CREATININE: 2.33 mg/dL — AB (ref 0.44–1.00)
GFR calc non Af Amer: 19 mL/min — ABNORMAL LOW (ref >60)
GFR, EST AFRICAN AMERICAN: 22 mL/min — AB (ref >60)
Glucose, Bld: 89 mg/dL (ref 65–99)
Potassium: 4 mmol/L (ref 3.5–5.1)
SODIUM: 139 mmol/L (ref 135–145)

## 2017-11-11 LAB — GLUCOSE, CAPILLARY
GLUCOSE-CAPILLARY: 83 mg/dL (ref 65–99)
Glucose-Capillary: 131 mg/dL — ABNORMAL HIGH (ref 65–99)

## 2017-11-11 LAB — CULTURE, BLOOD (ROUTINE X 2)
CULTURE: NO GROWTH
Culture: NO GROWTH
SPECIAL REQUESTS: ADEQUATE
Special Requests: ADEQUATE

## 2017-11-11 LAB — CBC
HCT: 36.2 % (ref 36.0–46.0)
Hemoglobin: 12.6 g/dL (ref 12.0–15.0)
MCH: 31 pg (ref 26.0–34.0)
MCHC: 34.8 g/dL (ref 30.0–36.0)
MCV: 89.2 fL (ref 78.0–100.0)
PLATELETS: 97 10*3/uL — AB (ref 150–400)
RBC: 4.06 MIL/uL (ref 3.87–5.11)
RDW: 16.6 % — AB (ref 11.5–15.5)
WBC: 3.6 10*3/uL — AB (ref 4.0–10.5)

## 2017-11-11 MED ORDER — METOPROLOL TARTRATE 25 MG PO TABS: 12.5000 mg | ORAL_TABLET | Freq: Two times a day (BID) | ORAL | 0 refills | Status: DC

## 2017-11-11 MED ORDER — POTASSIUM CHLORIDE ER 20 MEQ PO TBCR: 20.0000 meq | EXTENDED_RELEASE_TABLET | Freq: Every day | ORAL | 0 refills | Status: AC

## 2017-11-11 MED ORDER — CALCITRIOL 0.25 MCG PO CAPS: 0.2500 ug | ORAL_CAPSULE | ORAL | 0 refills | Status: DC

## 2017-11-11 MED ORDER — OMEPRAZOLE 40 MG PO CPDR: 40.0000 mg | DELAYED_RELEASE_CAPSULE | Freq: Every day | ORAL | Status: AC

## 2017-11-11 MED ORDER — FUROSEMIDE 80 MG PO TABS: 160.0000 mg | ORAL_TABLET | Freq: Two times a day (BID) | ORAL | 0 refills | Status: DC

## 2017-11-11 NOTE — Care Management (Signed)
Pt had HH services PT, OT, RN through [facility] and would like to resume services with [facility].  Sarah with [facility] contacted and information faxed to [phone].

## 2017-11-11 NOTE — Progress Notes (Signed)
Patient ID: Mandy Rhodes, female   DOB: Nov 01, 1941, 76 y.o.   MRN: [id number] [facility] Progress Note   Assessment/ Plan:   1. AKI on chronic kidney disease stage III/4 versus progression of chronic kidney disease: Appears to have had progression over the past year likely from chronic cardiorenal mechanisms. Net -1.9 L from fluid standpoint but paradoxically weight up by 1 kg. Clinically, continues to show improvement with slight fluctuation of creatinine noted overnight. Recommend discharge home with furosemide 160 mg twice a day that will likely be downward titrated upon follow-up with Dr. Joelyn Oms on 12/03/2017. She will need labs next week with her primary care provider to follow-up on renal function/electrolytes. 2. Hypokalemia: Secondary to diuretic induced wasting, corrected with oral replacement. Would recommend discharge home on oral potassium chloride 20 mEq a day. 3. Acute CHF exacerbation: Optimizing diuretic therapy at this time. Blood pressures remain under good control in spite of holding anti-hypertensive agents indicating that volume is likely playing a prominent role with her elevated pressures. Will check serum protein electrophoresis and free light chains today prior to possible discharge. 4. Anemia of chronic kidney disease: Low iron saturation of 19%, status post intravenous Fereheme. 5. Secondary hyperparathyroidism: On calcitriol for PTH suppression and with normal phosphorus level. No indication for binder.  Subjective:   Reports that she continues to feel better and looking forward to walking around today to see how she does with tolerance to exertion.    Objective:   BP 111/63 (BP Location: Left Arm)   Pulse 73   Temp 98.2 F (36.8 C) (Oral)   Resp 18   Ht 5\' 7"  (1.702 m)   Wt 103.5 kg (228 lb 2.8 oz)   SpO2 93%   BMI 35.74 kg/m   Intake/Output Summary (Last 24 hours) at 11/11/2017 8416 Last data filed at 11/11/2017 0436 Gross per 24 hour   Intake 480 ml  Output 2400 ml  Net -1920 ml   Weight change: 0.9 kg (1 lb 15.8 oz)  Physical Exam: Gen: Comfortably resting in bed, nurse at bedside  CVS: Pulse regular rhythm, S1 and S2 with no rubs Resp: Diminished breath sounds over bases with fine rales midlung fields Abd: Soft, obese, nontender Ext: 2+ pitting lower extremity edema  Imaging: No results found.  Labs: BMET Recent Labs  Lab 11/05/17 1916 11/06/17 0132 11/07/17 0438 11/08/17 0901 11/09/17 0630 11/10/17 0634 11/11/17 0412  NA 143 142 143 142 141 140 139  K 3.9 3.8 3.7 3.5 3.4* 3.6 4.0  CL 108 109 107 106 105 103 102  CO2 26 23 23 28 25 27 29   GLUCOSE 114* 98 124* 126* 102* 90 89  BUN 47* 45* 47* 43* 43* 37* 40*  CREATININE 2.51* 2.30* 2.43* 2.55* 2.25* 2.11* 2.33*  CALCIUM 10.0 9.6 9.7 9.6 9.6 9.7 9.7  PHOS  --   --   --   --  3.3 3.2  --    CBC Recent Labs  Lab 11/05/17 1916 11/06/17 0132 11/07/17 0438 11/08/17 0901 11/11/17 0412  WBC 3.5* 3.1* 3.2* 3.6* 3.6*  NEUTROABS 2.0 1.6* 1.4* 2.0  --   HGB 13.0 12.4 11.5* 11.4* 12.6  HCT 37.9 36.0 34.1* 33.6* 36.2  MCV 89.0 88.7 88.1 87.7 89.2  PLT 94* 80* 79* 87* 97*    Medications:    . aspirin EC  81 mg Oral Daily  . [START ON 11/12/2017] calcitRIOL  0.25 mcg Oral Q M,W,F  . furosemide  160 mg  Oral BID  . insulin aspart  0-9 Units Subcutaneous TID WC  . metoprolol tartrate  12.5 mg Oral BID  . multivitamin with minerals  1 tablet Oral q morning - 10a  . potassium chloride  20 mEq Oral BID  . sodium chloride flush  3 mL Intravenous Q12H   Elmarie Shiley, MD 11/11/2017, 9:22 AM

## 2017-11-11 NOTE — Discharge Summary (Signed)
Physician Discharge Summary  Mandy Rhodes GXQ:[id number] DOB: 13-Nov-1941  PCP: Harlan Stains, MD  Admit date: 11/05/2017 Discharge date: 11/11/2017  Recommendations for Outpatient Follow-up:  1. Dr. Harlan Stains, PCP in 1 week with repeat labs (CBC & Renal panel). 2. Dr. Pearson Grippe, Nephrology: Patient advised to keep prior appointment on 12/03/17. 3. Dr. Lyman Bishop, Cardiology 4. Recommend repeating chest x-ray in 4 weeks to ensure resolution of pneumonia findings. 5. Recommend outpatient Hematology consultation.  Home Health: PT. Patient declined SNF. Equipment/Devices: None    Discharge Condition: Improved and stable.  CODE STATUS: Full  Diet recommendation: Heart healthy & diabetic diet.  Discharge Diagnoses:  Principal Problem:   Combined systolic and diastolic congestive heart failure (HCC) Active Problems:   Diabetes mellitus (HCC)   Essential hypertension   Acute kidney injury superimposed on chronic kidney disease (HCC)   Acute on chronic combined systolic and diastolic CHF (congestive heart failure) (HCC)   Leukopenia   Thrombocytopenia (HCC)   Prolonged Q-T interval on ECG   Brief Summary: 76 year old female with PMH of chronic combined systolic and diastolic CHF, last EF noted to be 30-35 percent with grade 2 diastolic dysfunction by TTE on 10/13/16, DM 2, stage III chronic kidney disease, nonobstructing CAD who presented with complaints of progressively worsening dyspnea off 1 week duration. She was admitted for acute on chronic combined CHF, suspected community-acquired pneumonia and chronic kidney disease. Cardiology and nephrology consulted.  Assessment & Plan: Acute on Chronic combine systolic and diastolic CHF  EF 14-48% in 09/2016 Repeat TTE 11/06/17: LVEF 25-30 percent, diffuse hypokinesis and grade 2 diastolic dysfunction. Cardiology and nephrology assisted with volume management. Initially treated with IV Lasix which was then switched to  high-dose oral Lasix by nephrology. BiDil discontinued to allow renal perfusion. Unable to use ACE/ARBs and aldactone due to CKD  Negative 4.8 L since admission. Patient was evaluated along with Nephrology on day of discharge who recommend high-dose Lasix as below which can be down titrated during outpatient follow-up with Nephrology As per cardiology, continue low-dose metoprolol.  Lobar Pneumonia Left base infiltrate on CXR  Strep pneumo antigen positive  Procalcitonin negative   Completed 6 days of IV antibiotics. Clinically improved. However all her presentation including left pleural effusion, left base atelectasis rather than infiltrate could have been related to pulmonary edema from decompensated CHF. Blood cultures 2: Negative, final report. Recommend follow-up chest x-ray in 4 weeks to ensure resolution of pneumonia findings.  Elevated troponin Likely demand ischemia from CHF  Cards recommendations appreciated  No further ischemic work up needed   AKI on CKD stage IV Baseline ~ Cr 1.9 Nephrology was consulted and it appears that she has progression of her chronic kidney disease likely from chronic cardiorenal mechanisms Nephrology assisted with diuretic management and recommended discharging on Lasix 160 MG twice daily along with potassium 20 meq daily with close follow-up of BMP in one week with PCP and nephrology on previous appointment mid January 2019. SPEP pending and outpatient follow-up by nephrology. Creatinine relatively stable.  Hypokalemia Due to diuretics. Replaced. Continue potassium supplements as outpatient.  Secondary hyperparathyroidism Management per nephrology. Continue calcitriol.  Anemia of chronic kidney disease Status post IV Feraheme. Hemoglobin stable. Outpatient follow-up.  Thrombocytopenia/leukopenia Thrombocytopenia appears chronic since March 2018. Platelet count has however dropped to less than 100 since mid-June. Stable in the  hospital. Leukopenia noted since June 2018. Recommend outpatient hematology evaluation. No bleeding reported.  HTN with hypertensive heart disease Severe LVH on echocardiogram. Likely  hypertensive cardiomyopathy. Although hypertrophy is likely due to underlying hypertension, amyloidosis may be considered. SPEP ordered by nephrology and to be followed as outpatient. Blood pressure is controlled.   DM type 2  CBG is mildly uncontrolled and fluctuating at times. Continue low-dose glimepiride but would have a low threshold to stop if has hypoglycemia related to progressive renal insufficiency. Monitor closely.   Consultations:  Nephrology  Cardiology  Procedures:  TTE   Discharge Instructions  Discharge Instructions    (HEART FAILURE PATIENTS) Call MD:  Anytime you have any of the following symptoms: 1) 3 pound weight gain in 24 hours or 5 pounds in 1 week 2) shortness of breath, with or without a dry hacking cough 3) swelling in the hands, feet or stomach 4) if you have to sleep on extra pillows at night in order to breathe.   Complete by:  As directed    Call MD for:  difficulty breathing, headache or visual disturbances   Complete by:  As directed    Call MD for:  extreme fatigue   Complete by:  As directed    Call MD for:  persistant dizziness or light-headedness   Complete by:  As directed    Diet - low sodium heart healthy   Complete by:  As directed    Diet Carb Modified   Complete by:  As directed    Increase activity slowly   Complete by:  As directed        Medication List    STOP taking these medications   nortriptyline 10 MG capsule Commonly known as:  PAMELOR     TAKE these medications   acetaminophen 325 MG tablet Commonly known as:  TYLENOL Take 650 mg by mouth every 6 (six) hours as needed for mild pain.   aspirin 81 MG EC tablet Chew 81 mg by mouth daily.   calcitRIOL 0.25 MCG capsule Commonly known as:  ROCALTROL Take 1 capsule (0.25 mcg  total) by mouth every Monday, Wednesday, and Friday. Start taking on:  11/12/2017   CALCIUM 600 + D PO Take 1 tablet by mouth every morning.   ferrous sulfate 325 (65 FE) MG EC tablet Take 325 mg by mouth daily.   furosemide 80 MG tablet Commonly known as:  LASIX Take 2 tablets (160 mg total) by mouth 2 (two) times daily. What changed:    medication strength  how much to take  when to take this   glimepiride 1 MG tablet Commonly known as:  AMARYL Take 1 mg by mouth daily.   metoprolol tartrate 25 MG tablet Commonly known as:  LOPRESSOR Take 0.5 tablets (12.5 mg total) by mouth 2 (two) times daily. What changed:    how much to take  how to take this   omeprazole 40 MG capsule Commonly known as:  PRILOSEC Take 1 capsule (40 mg total) by mouth daily. What changed:    how to take this  when to take this  additional instructions   Potassium Chloride ER 20 MEQ Tbcr Take 20 mEq by mouth daily.   REFRESH DRY EYE THERAPY OP Place 1 drop into the left eye 2 (two) times daily.   TAB-A-VITE Tabs Take 1 tablet by mouth every morning.      Follow-up Information    Harlan Stains, MD. Schedule an appointment as soon as possible for a visit in 1 week(s).   Specialty:  Family Medicine Why:  To be seen with repeat labs (CBC & Renal panel).  Contact information: [place] [address] [phone]        Rexene Agent, MD Follow up on 12/03/2017.   Specialty:  Nephrology Why:  Keep prior appointment. Contact information: [place] [address] [phone]        Pixie Casino, MD. Schedule an appointment as soon as possible for a visit.   Specialty:  Cardiology Contact information: [place] [address] [phone]          Allergies  Allergen Reactions  . Sulfa Antibiotics Swelling      Procedures/Studies: Dg Chest 2 View  Result Date: 11/05/2017 CLINICAL DATA:   Short of breath EXAM: CHEST  2 VIEW COMPARISON:  04/04/2017 FINDINGS: Probable moderate left pleural effusion. Cardiomegaly with vascular congestion. Atelectasis or infiltrate at the left base. No pneumothorax. IMPRESSION: 1. Cardiomegaly with vascular congestion 2. Suspected moderate left pleural effusion. Dense atelectasis or infiltrate at the left base. Electronically Signed   By: Donavan Foil M.D.   On: 11/05/2017 18:47      Subjective: Reports feeling much better than on admission. Denies dyspnea. Leg swelling slowly improving. No chest pain, cough or fever reported. States that she moves around at home with a walker or a wheelchair. Her granddaughter stays with her. She does not use oxygen at home.  Discharge Exam:  Vitals:   11/10/17 2056 11/11/17 0432 11/11/17 0941 11/11/17 1224  BP: 120/82 111/63 (!) 106/47 105/75  Pulse: 90 73 81 77  Resp: 18 18  18   Temp: 98.5 F (36.9 C) 98.2 F (36.8 C)  98.1 F (36.7 C)  TempSrc: Oral Oral  Oral  SpO2: 100% 93% 93% 98%  Weight:  103.5 kg (228 lb 2.8 oz)    Height:        General: Pt lying comfortably in bed & appears in no obvious distress. Patient was interviewed and examined along with Nephrology. Cardiovascular: S1 & S2 heard, RRR, S1/S2 +. No murmurs, rubs, gallops or clicks. No JVD. 2+ pitting lower extremity edema, reportedly improving. Telemetry: Personally reviewed and showed sinus rhythm, BBB morphology and occasional PVCs. Respiratory: Slightly diminished breath sounds in the bases with occasional basal crackles but otherwise clear to auscultation. No increased work of breathing. Abdominal:  Non distended, non tender & soft. No organomegaly or masses appreciated. Normal bowel sounds heard. CNS: Alert and oriented. No focal deficits. Extremities: no edema, no cyanosis    The results of significant diagnostics from this hospitalization (including imaging, microbiology, ancillary and laboratory) are listed below for reference.      Microbiology: Recent Results (from the past 240 hour(s))  Culture, blood (routine x 2) Call MD if unable to obtain prior to antibiotics being given     Status: None   Collection Time: 11/05/17 10:31 PM  Result Value Ref Range Status   Specimen Description BLOOD LEFT ANTECUBITAL  Final   Special Requests   Final    BOTTLES DRAWN AEROBIC AND ANAEROBIC Blood Culture adequate volume   Culture NO GROWTH 5 DAYS  Final   Report Status 11/11/2017 FINAL  Final  Culture, blood (routine x 2) Call MD if unable to obtain prior to antibiotics being given     Status: None   Collection Time: 11/06/17  2:26 AM  Result Value Ref Range Status   Specimen Description BLOOD LEFT FOREARM  Final   Special Requests   Final    BOTTLES DRAWN AEROBIC AND ANAEROBIC Blood Culture  adequate volume   Culture NO GROWTH 5 DAYS  Final   Report Status 11/11/2017 FINAL  Final     Labs: CBC: Recent Labs  Lab 11/05/17 1916 11/06/17 0132 11/07/17 0438 11/08/17 0901 11/11/17 0412  WBC 3.5* 3.1* 3.2* 3.6* 3.6*  NEUTROABS 2.0 1.6* 1.4* 2.0  --   HGB 13.0 12.4 11.5* 11.4* 12.6  HCT 37.9 36.0 34.1* 33.6* 36.2  MCV 89.0 88.7 88.1 87.7 89.2  PLT 94* 80* 79* 87* 97*   Basic Metabolic Panel: Recent Labs  Lab 11/07/17 0438 11/08/17 0901 11/09/17 0630 11/10/17 0630 11/10/17 0634 11/11/17 0412  NA 143 142 141  --  140 139  K 3.7 3.5 3.4*  --  3.6 4.0  CL 107 106 105  --  103 102  CO2 23 28 25   --  27 29  GLUCOSE 124* 126* 102*  --  90 89  BUN 47* 43* 43*  --  37* 40*  CREATININE 2.43* 2.55* 2.25*  --  2.11* 2.33*  CALCIUM 9.7 9.6 9.6  --  9.7 9.7  MG  --   --   --  2.1  --   --   PHOS  --   --  3.3  --  3.2  --    Liver Function Tests: Recent Labs  Lab 11/09/17 0630 11/10/17 0634  ALBUMIN 3.1* 3.1*   BNP (last 3 results) Recent Labs    04/05/17 0420 11/05/17 1916 11/07/17 1353  BNP 1,576.4* 1,651.6* 1,122.8*   Cardiac Enzymes: Recent Labs  Lab 11/05/17 2210 11/06/17 0132 11/06/17 0322   TROPONINI 0.37* 0.35* 0.34*   CBG: Recent Labs  Lab 11/10/17 1157 11/10/17 1612 11/10/17 2126 11/11/17 0736 11/11/17 1200  GLUCAP 118* 146* 108* 83 131*   Anemia work up Recent Labs    11/09/17 0636  TIBC 274  IRON 52   Urinalysis    Component Value Date/Time   COLORURINE YELLOW 11/08/2017 [place] 11/08/2017 1658   LABSPEC 1.008 11/08/2017 1658   LABSPEC 1.020 12/22/2010 1142   PHURINE 6.0 11/08/2017 1658   GLUCOSEU NEGATIVE 11/08/2017 1658   HGBUR NEGATIVE 11/08/2017 [place] 11/08/2017 1658   BILIRUBINUR Negative 12/22/2010 [place] 11/08/2017 1658   PROTEINUR NEGATIVE 11/08/2017 1658   UROBILINOGEN 0.2 07/15/2015 1349   NITRITE NEGATIVE 11/08/2017 1658   LEUKOCYTESUR NEGATIVE 11/08/2017 1658   LEUKOCYTESUR Negative 12/22/2010 1142      Time coordinating discharge: Over 30 minutes  SIGNED:  Vernell Leep, MD, FACP, [facility]. [facility] Pager [phone]  If 7PM-7AM, please contact night-coverage [email or web address] Password [facility] 11/11/2017, 3:07 PM

## 2017-11-11 NOTE — Progress Notes (Signed)
Progress Note  Patient Name: Mandy Rhodes Date of Encounter: 11/11/2017  Primary Cardiologist: No primary care provider on file. Dr. Debara Pickett  Subjective   Feeling better.  Thinks that lower extremity edema is improving day by day.  No shortness of breath currently.  Inpatient Medications    Scheduled Meds: . aspirin EC  81 mg Oral Daily  . [START ON 11/12/2017] calcitRIOL  0.25 mcg Oral Q M,W,F  . furosemide  160 mg Oral BID  . insulin aspart  0-9 Units Subcutaneous TID WC  . metoprolol tartrate  12.5 mg Oral BID  . multivitamin with minerals  1 tablet Oral q morning - 10a  . potassium chloride  20 mEq Oral BID  . sodium chloride flush  3 mL Intravenous Q12H   Continuous Infusions: . sodium chloride    . cefTRIAXone (ROCEPHIN)  IV Stopped (11/10/17 2255)   PRN Meds: sodium chloride, acetaminophen, sodium chloride flush   Vital Signs    Vitals:   11/10/17 0800 11/10/17 1233 11/10/17 2056 11/11/17 0432  BP: 110/60 (!) 103/55 120/82 111/63  Pulse:  63 90 73  Resp:  18 18 18   Temp:  98.2 F (36.8 C) 98.5 F (36.9 C) 98.2 F (36.8 C)  TempSrc:  Oral Oral Oral  SpO2:  98% 100% 93%  Weight:    228 lb 2.8 oz (103.5 kg)  Height:        Intake/Output Summary (Last 24 hours) at 11/11/2017 0943 Last data filed at 11/11/2017 0436 Gross per 24 hour  Intake 480 ml  Output 2400 ml  Net -1920 ml   Filed Weights   11/09/17 0551 11/10/17 0500 11/11/17 0432  Weight: 228 lb 9.9 oz (103.7 kg) 226 lb 3.1 oz (102.6 kg) 228 lb 2.8 oz (103.5 kg)    Telemetry    Normal sinus rhythm, occasional PVCs- Personally Reviewed  ECG    No new EKG- Personally Reviewed  Physical Exam   GEN: Well nourished, well developed, in no acute distress  HEENT: normal  Neck: no JVD, carotid bruits, or masses Cardiac: RRR; no murmurs, rubs, or gallops, 1+BLE edema  Respiratory:  clear to auscultation bilaterally, normal work of breathing GI: soft, nontender, nondistended, + BS MS: no  deformity or atrophy  Skin: warm and dry, no rash Neuro:  Alert and Oriented x 3, Strength and sensation are intact Psych: euthymic mood, full affect   Labs    Chemistry Recent Labs  Lab 11/09/17 0630 11/10/17 0634 11/11/17 0412  NA 141 140 139  K 3.4* 3.6 4.0  CL 105 103 102  CO2 25 27 29   GLUCOSE 102* 90 89  BUN 43* 37* 40*  CREATININE 2.25* 2.11* 2.33*  CALCIUM 9.6 9.7 9.7  ALBUMIN 3.1* 3.1*  --   GFRNONAA 20* 22* 19*  GFRAA 23* 25* 22*  ANIONGAP 11 10 8      Hematology Recent Labs  Lab 11/07/17 0438 11/08/17 0901 11/11/17 0412  WBC 3.2* 3.6* 3.6*  RBC 3.87 3.83* 4.06  HGB 11.5* 11.4* 12.6  HCT 34.1* 33.6* 36.2  MCV 88.1 87.7 89.2  MCH 29.7 29.8 31.0  MCHC 33.7 33.9 34.8  RDW 16.5* 16.3* 16.6*  PLT 79* 87* 97*    Cardiac Enzymes Recent Labs  Lab 11/05/17 2210 11/06/17 0132 11/06/17 0322  TROPONINI 0.37* 0.35* 0.34*    Recent Labs  Lab 11/05/17 1922  TROPIPOC 0.34*     BNP Recent Labs  Lab 11/05/17 1916 11/07/17 1353  BNP 1,651.6*  1,122.8*     DDimer No results for input(s): DDIMER in the last 168 hours.   Radiology    No results found.  Cardiac Studies   Echocardiogram 11/06/17: -EF 25-30% with severe LV hypertrophy.  Cardiac catheterization 03/22/16 -Minor nonobstructive CAD  Patient Profile     76 y.o. female combined systolic and diastolic heart failure, nonischemic cardiomyopathy, hypertension, hyperlipidemia, diabetes, chronic kidney disease stage IV, nonobstructive CAD  Assessment & Plan    Acute on chronic systolic and diastolic heart failure -High-dose Lasix.  Appreciate Dr. Posey Pronto, nephrology suggestions.  -4 L out. -Difficult to utilize traditional dosing of heart failure medications because of her overall hypotension.  We will continue with low-dose metoprolol tartrate 12.5 mg twice a day.  Consider transitioning over to Toprol in the future. -Continuing with furosemide 160 mg p.o. twice daily.  This will be monitored  carefully with nephrology follow-up.  Hypertensive heart disease with heart failure -Severe LVH noted on echocardiogram.  Likely hypertensive cardiomyopathy. - I do like the previous thought of considering amyloidosis.  Although, hypertrophy is likely indicative of underlying hypertension.  SPEP has been ordered by Dr. Posey Pronto.  Chronic kidney disease stage IV -Creatinine 2.3.  Previous baseline was 2.0.  Dr. Serita Grit note reviewed.   We will sign off.  Okay with discharge from cardiology perspective.  We will have her follow-up with Dr. Rennie Plowman.  For questions or updates, please contact [place] Please consult [email or web address] for contact info under Cardiology/STEMI.      Signed, Candee Furbish, MD  11/11/2017, 9:43 AM

## 2017-11-11 NOTE — Discharge Instructions (Signed)

## 2017-11-12 LAB — PROTEIN ELECTROPHORESIS, SERUM
A/G Ratio: 1.1 (ref 0.7–1.7)
ALPHA-2-GLOBULIN: 0.4 g/dL (ref 0.4–1.0)
Albumin ELP: 3.4 g/dL (ref 2.9–4.4)
Alpha-1-Globulin: 0.2 g/dL (ref 0.0–0.4)
BETA GLOBULIN: 1 g/dL (ref 0.7–1.3)
GAMMA GLOBULIN: 1.4 g/dL (ref 0.4–1.8)
GLOBULIN, TOTAL: 3 g/dL (ref 2.2–3.9)
Total Protein ELP: 6.4 g/dL (ref 6.0–8.5)

## 2017-11-12 LAB — KAPPA/LAMBDA LIGHT CHAINS
Kappa free light chain: 66.9 mg/L — ABNORMAL HIGH (ref 3.3–19.4)
Kappa, lambda light chain ratio: 2.11 — ABNORMAL HIGH (ref 0.26–1.65)
Lambda free light chains: 31.7 mg/L — ABNORMAL HIGH (ref 5.7–26.3)

## 2017-11-15 DIAGNOSIS — M1990 Unspecified osteoarthritis, unspecified site: Secondary | ICD-10-CM | POA: Diagnosis not present

## 2017-11-15 DIAGNOSIS — J449 Chronic obstructive pulmonary disease, unspecified: Secondary | ICD-10-CM | POA: Diagnosis not present

## 2017-11-15 DIAGNOSIS — M6281 Muscle weakness (generalized): Secondary | ICD-10-CM | POA: Diagnosis not present

## 2017-11-15 DIAGNOSIS — E1122 Type 2 diabetes mellitus with diabetic chronic kidney disease: Secondary | ICD-10-CM | POA: Diagnosis not present

## 2017-11-15 LAB — PROTEIN ELECTROPHORESIS, SERUM
A/G Ratio: 0.9 (ref 0.7–1.7)
ALPHA-2-GLOBULIN: 0.5 g/dL (ref 0.4–1.0)
Albumin ELP: 3.2 g/dL (ref 2.9–4.4)
Alpha-1-Globulin: 0.3 g/dL (ref 0.0–0.4)
BETA GLOBULIN: 1.1 g/dL (ref 0.7–1.3)
GAMMA GLOBULIN: 1.7 g/dL (ref 0.4–1.8)
Globulin, Total: 3.5 g/dL (ref 2.2–3.9)
Total Protein ELP: 6.7 g/dL (ref 6.0–8.5)

## 2017-11-16 DIAGNOSIS — J449 Chronic obstructive pulmonary disease, unspecified: Secondary | ICD-10-CM | POA: Diagnosis not present

## 2017-11-16 DIAGNOSIS — I471 Supraventricular tachycardia: Secondary | ICD-10-CM | POA: Diagnosis not present

## 2017-11-16 DIAGNOSIS — E1122 Type 2 diabetes mellitus with diabetic chronic kidney disease: Secondary | ICD-10-CM | POA: Diagnosis not present

## 2017-11-16 DIAGNOSIS — M1990 Unspecified osteoarthritis, unspecified site: Secondary | ICD-10-CM | POA: Diagnosis not present

## 2017-11-22 DIAGNOSIS — M6281 Muscle weakness (generalized): Secondary | ICD-10-CM | POA: Diagnosis not present

## 2017-11-22 DIAGNOSIS — M1990 Unspecified osteoarthritis, unspecified site: Secondary | ICD-10-CM | POA: Diagnosis not present

## 2017-11-22 DIAGNOSIS — E1122 Type 2 diabetes mellitus with diabetic chronic kidney disease: Secondary | ICD-10-CM | POA: Diagnosis not present

## 2017-11-22 DIAGNOSIS — J449 Chronic obstructive pulmonary disease, unspecified: Secondary | ICD-10-CM | POA: Diagnosis not present

## 2017-11-26 DIAGNOSIS — N179 Acute kidney failure, unspecified: Secondary | ICD-10-CM | POA: Diagnosis not present

## 2017-11-26 DIAGNOSIS — E1122 Type 2 diabetes mellitus with diabetic chronic kidney disease: Secondary | ICD-10-CM | POA: Diagnosis not present

## 2017-11-26 DIAGNOSIS — J449 Chronic obstructive pulmonary disease, unspecified: Secondary | ICD-10-CM | POA: Diagnosis not present

## 2017-11-26 DIAGNOSIS — J13 Pneumonia due to Streptococcus pneumoniae: Secondary | ICD-10-CM | POA: Diagnosis not present

## 2017-11-26 DIAGNOSIS — M1990 Unspecified osteoarthritis, unspecified site: Secondary | ICD-10-CM | POA: Diagnosis not present

## 2017-11-26 DIAGNOSIS — D696 Thrombocytopenia, unspecified: Secondary | ICD-10-CM | POA: Diagnosis not present

## 2017-11-26 DIAGNOSIS — D72819 Decreased white blood cell count, unspecified: Secondary | ICD-10-CM | POA: Diagnosis not present

## 2017-11-26 DIAGNOSIS — I5023 Acute on chronic systolic (congestive) heart failure: Secondary | ICD-10-CM | POA: Diagnosis not present

## 2017-11-26 DIAGNOSIS — N184 Chronic kidney disease, stage 4 (severe): Secondary | ICD-10-CM | POA: Diagnosis not present

## 2017-11-26 DIAGNOSIS — M6281 Muscle weakness (generalized): Secondary | ICD-10-CM | POA: Diagnosis not present

## 2017-11-27 DIAGNOSIS — M6281 Muscle weakness (generalized): Secondary | ICD-10-CM | POA: Diagnosis not present

## 2017-11-27 DIAGNOSIS — J449 Chronic obstructive pulmonary disease, unspecified: Secondary | ICD-10-CM | POA: Diagnosis not present

## 2017-11-27 DIAGNOSIS — M1990 Unspecified osteoarthritis, unspecified site: Secondary | ICD-10-CM | POA: Diagnosis not present

## 2017-11-27 DIAGNOSIS — E1122 Type 2 diabetes mellitus with diabetic chronic kidney disease: Secondary | ICD-10-CM | POA: Diagnosis not present

## 2017-11-28 DIAGNOSIS — M1990 Unspecified osteoarthritis, unspecified site: Secondary | ICD-10-CM | POA: Diagnosis not present

## 2017-11-28 DIAGNOSIS — J449 Chronic obstructive pulmonary disease, unspecified: Secondary | ICD-10-CM | POA: Diagnosis not present

## 2017-11-28 DIAGNOSIS — E1122 Type 2 diabetes mellitus with diabetic chronic kidney disease: Secondary | ICD-10-CM | POA: Diagnosis not present

## 2017-11-28 DIAGNOSIS — M6281 Muscle weakness (generalized): Secondary | ICD-10-CM | POA: Diagnosis not present

## 2017-11-30 DIAGNOSIS — J449 Chronic obstructive pulmonary disease, unspecified: Secondary | ICD-10-CM | POA: Diagnosis not present

## 2017-11-30 DIAGNOSIS — M6281 Muscle weakness (generalized): Secondary | ICD-10-CM | POA: Diagnosis not present

## 2017-11-30 DIAGNOSIS — M1990 Unspecified osteoarthritis, unspecified site: Secondary | ICD-10-CM | POA: Diagnosis not present

## 2017-11-30 DIAGNOSIS — E1122 Type 2 diabetes mellitus with diabetic chronic kidney disease: Secondary | ICD-10-CM | POA: Diagnosis not present

## 2017-12-03 DIAGNOSIS — I129 Hypertensive chronic kidney disease with stage 1 through stage 4 chronic kidney disease, or unspecified chronic kidney disease: Secondary | ICD-10-CM | POA: Diagnosis not present

## 2017-12-03 DIAGNOSIS — N184 Chronic kidney disease, stage 4 (severe): Secondary | ICD-10-CM | POA: Diagnosis not present

## 2017-12-03 DIAGNOSIS — I5040 Unspecified combined systolic (congestive) and diastolic (congestive) heart failure: Secondary | ICD-10-CM | POA: Diagnosis not present

## 2017-12-04 ENCOUNTER — Ambulatory Visit: Payer: Medicare HMO | Admitting: Internal Medicine

## 2017-12-04 DIAGNOSIS — M1990 Unspecified osteoarthritis, unspecified site: Secondary | ICD-10-CM | POA: Diagnosis not present

## 2017-12-04 DIAGNOSIS — M6281 Muscle weakness (generalized): Secondary | ICD-10-CM | POA: Diagnosis not present

## 2017-12-04 DIAGNOSIS — J449 Chronic obstructive pulmonary disease, unspecified: Secondary | ICD-10-CM | POA: Diagnosis not present

## 2017-12-04 DIAGNOSIS — E1122 Type 2 diabetes mellitus with diabetic chronic kidney disease: Secondary | ICD-10-CM | POA: Diagnosis not present

## 2017-12-06 DIAGNOSIS — J449 Chronic obstructive pulmonary disease, unspecified: Secondary | ICD-10-CM | POA: Diagnosis not present

## 2017-12-06 DIAGNOSIS — M6281 Muscle weakness (generalized): Secondary | ICD-10-CM | POA: Diagnosis not present

## 2017-12-06 DIAGNOSIS — E1122 Type 2 diabetes mellitus with diabetic chronic kidney disease: Secondary | ICD-10-CM | POA: Diagnosis not present

## 2017-12-06 DIAGNOSIS — M1990 Unspecified osteoarthritis, unspecified site: Secondary | ICD-10-CM | POA: Diagnosis not present

## 2017-12-07 DIAGNOSIS — M6281 Muscle weakness (generalized): Secondary | ICD-10-CM | POA: Diagnosis not present

## 2017-12-07 DIAGNOSIS — J449 Chronic obstructive pulmonary disease, unspecified: Secondary | ICD-10-CM | POA: Diagnosis not present

## 2017-12-07 DIAGNOSIS — M1990 Unspecified osteoarthritis, unspecified site: Secondary | ICD-10-CM | POA: Diagnosis not present

## 2017-12-07 DIAGNOSIS — E1122 Type 2 diabetes mellitus with diabetic chronic kidney disease: Secondary | ICD-10-CM | POA: Diagnosis not present

## 2017-12-08 DIAGNOSIS — R262 Difficulty in walking, not elsewhere classified: Secondary | ICD-10-CM | POA: Diagnosis not present

## 2017-12-08 DIAGNOSIS — M6281 Muscle weakness (generalized): Secondary | ICD-10-CM | POA: Diagnosis not present

## 2017-12-08 DIAGNOSIS — I5042 Chronic combined systolic (congestive) and diastolic (congestive) heart failure: Secondary | ICD-10-CM | POA: Diagnosis not present

## 2017-12-10 ENCOUNTER — Ambulatory Visit
Admission: RE | Admit: 2017-12-10 | Discharge: 2017-12-10 | Disposition: A | Discharge status: Home / Self Care | Payer: Medicare Other | Source: Ambulatory Visit | Attending: Family Medicine | Admitting: Family Medicine

## 2017-12-10 ENCOUNTER — Other Ambulatory Visit: Payer: Self-pay | Admitting: Family Medicine

## 2017-12-10 DIAGNOSIS — I5023 Acute on chronic systolic (congestive) heart failure: Secondary | ICD-10-CM

## 2017-12-10 DIAGNOSIS — E1122 Type 2 diabetes mellitus with diabetic chronic kidney disease: Secondary | ICD-10-CM | POA: Diagnosis not present

## 2017-12-10 DIAGNOSIS — M1990 Unspecified osteoarthritis, unspecified site: Secondary | ICD-10-CM | POA: Diagnosis not present

## 2017-12-10 DIAGNOSIS — M6281 Muscle weakness (generalized): Secondary | ICD-10-CM | POA: Diagnosis not present

## 2017-12-10 DIAGNOSIS — J13 Pneumonia due to Streptococcus pneumoniae: Secondary | ICD-10-CM

## 2017-12-10 DIAGNOSIS — J449 Chronic obstructive pulmonary disease, unspecified: Secondary | ICD-10-CM | POA: Diagnosis not present

## 2017-12-11 DIAGNOSIS — M6281 Muscle weakness (generalized): Secondary | ICD-10-CM | POA: Diagnosis not present

## 2017-12-11 DIAGNOSIS — M1990 Unspecified osteoarthritis, unspecified site: Secondary | ICD-10-CM | POA: Diagnosis not present

## 2017-12-11 DIAGNOSIS — J449 Chronic obstructive pulmonary disease, unspecified: Secondary | ICD-10-CM | POA: Diagnosis not present

## 2017-12-11 DIAGNOSIS — E1122 Type 2 diabetes mellitus with diabetic chronic kidney disease: Secondary | ICD-10-CM | POA: Diagnosis not present

## 2017-12-12 DIAGNOSIS — M6281 Muscle weakness (generalized): Secondary | ICD-10-CM | POA: Diagnosis not present

## 2017-12-12 DIAGNOSIS — E1122 Type 2 diabetes mellitus with diabetic chronic kidney disease: Secondary | ICD-10-CM | POA: Diagnosis not present

## 2017-12-12 DIAGNOSIS — M1990 Unspecified osteoarthritis, unspecified site: Secondary | ICD-10-CM | POA: Diagnosis not present

## 2017-12-12 DIAGNOSIS — J449 Chronic obstructive pulmonary disease, unspecified: Secondary | ICD-10-CM | POA: Diagnosis not present

## 2017-12-14 DIAGNOSIS — M1990 Unspecified osteoarthritis, unspecified site: Secondary | ICD-10-CM | POA: Diagnosis not present

## 2017-12-14 DIAGNOSIS — E1122 Type 2 diabetes mellitus with diabetic chronic kidney disease: Secondary | ICD-10-CM | POA: Diagnosis not present

## 2017-12-14 DIAGNOSIS — J449 Chronic obstructive pulmonary disease, unspecified: Secondary | ICD-10-CM | POA: Diagnosis not present

## 2017-12-14 DIAGNOSIS — M6281 Muscle weakness (generalized): Secondary | ICD-10-CM | POA: Diagnosis not present

## 2017-12-16 ENCOUNTER — Other Ambulatory Visit: Payer: Self-pay | Admitting: Family Medicine

## 2017-12-16 DIAGNOSIS — J90 Pleural effusion, not elsewhere classified: Secondary | ICD-10-CM

## 2017-12-17 DIAGNOSIS — J449 Chronic obstructive pulmonary disease, unspecified: Secondary | ICD-10-CM | POA: Diagnosis not present

## 2017-12-17 DIAGNOSIS — M6281 Muscle weakness (generalized): Secondary | ICD-10-CM | POA: Diagnosis not present

## 2017-12-17 DIAGNOSIS — E1122 Type 2 diabetes mellitus with diabetic chronic kidney disease: Secondary | ICD-10-CM | POA: Diagnosis not present

## 2017-12-17 DIAGNOSIS — M1990 Unspecified osteoarthritis, unspecified site: Secondary | ICD-10-CM | POA: Diagnosis not present

## 2017-12-18 DIAGNOSIS — J449 Chronic obstructive pulmonary disease, unspecified: Secondary | ICD-10-CM | POA: Diagnosis not present

## 2017-12-18 DIAGNOSIS — M75101 Unspecified rotator cuff tear or rupture of right shoulder, not specified as traumatic: Secondary | ICD-10-CM | POA: Diagnosis not present

## 2017-12-18 DIAGNOSIS — E1122 Type 2 diabetes mellitus with diabetic chronic kidney disease: Secondary | ICD-10-CM | POA: Diagnosis not present

## 2017-12-18 DIAGNOSIS — M1990 Unspecified osteoarthritis, unspecified site: Secondary | ICD-10-CM | POA: Diagnosis not present

## 2017-12-18 DIAGNOSIS — G8191 Hemiplegia, unspecified affecting right dominant side: Secondary | ICD-10-CM | POA: Diagnosis not present

## 2017-12-18 DIAGNOSIS — M6281 Muscle weakness (generalized): Secondary | ICD-10-CM | POA: Diagnosis not present

## 2017-12-18 DIAGNOSIS — M4802 Spinal stenosis, cervical region: Secondary | ICD-10-CM | POA: Diagnosis not present

## 2017-12-19 DIAGNOSIS — J449 Chronic obstructive pulmonary disease, unspecified: Secondary | ICD-10-CM | POA: Diagnosis not present

## 2017-12-19 DIAGNOSIS — E1122 Type 2 diabetes mellitus with diabetic chronic kidney disease: Secondary | ICD-10-CM | POA: Diagnosis not present

## 2017-12-19 DIAGNOSIS — M1990 Unspecified osteoarthritis, unspecified site: Secondary | ICD-10-CM | POA: Diagnosis not present

## 2017-12-19 DIAGNOSIS — M6281 Muscle weakness (generalized): Secondary | ICD-10-CM | POA: Diagnosis not present

## 2017-12-20 DIAGNOSIS — E1122 Type 2 diabetes mellitus with diabetic chronic kidney disease: Secondary | ICD-10-CM | POA: Diagnosis not present

## 2017-12-20 DIAGNOSIS — M6281 Muscle weakness (generalized): Secondary | ICD-10-CM | POA: Diagnosis not present

## 2017-12-20 DIAGNOSIS — M1990 Unspecified osteoarthritis, unspecified site: Secondary | ICD-10-CM | POA: Diagnosis not present

## 2017-12-20 DIAGNOSIS — J449 Chronic obstructive pulmonary disease, unspecified: Secondary | ICD-10-CM | POA: Diagnosis not present

## 2017-12-21 DIAGNOSIS — E1122 Type 2 diabetes mellitus with diabetic chronic kidney disease: Secondary | ICD-10-CM | POA: Diagnosis not present

## 2017-12-21 DIAGNOSIS — M6281 Muscle weakness (generalized): Secondary | ICD-10-CM | POA: Diagnosis not present

## 2017-12-21 DIAGNOSIS — M1990 Unspecified osteoarthritis, unspecified site: Secondary | ICD-10-CM | POA: Diagnosis not present

## 2017-12-21 DIAGNOSIS — J449 Chronic obstructive pulmonary disease, unspecified: Secondary | ICD-10-CM | POA: Diagnosis not present

## 2017-12-24 ENCOUNTER — Ambulatory Visit
Admission: RE | Admit: 2017-12-24 | Discharge: 2017-12-24 | Disposition: A | Discharge status: Home / Self Care | Payer: Medicare HMO | Source: Ambulatory Visit | Attending: Family Medicine | Admitting: Family Medicine

## 2017-12-24 DIAGNOSIS — J90 Pleural effusion, not elsewhere classified: Secondary | ICD-10-CM

## 2017-12-25 DIAGNOSIS — M6281 Muscle weakness (generalized): Secondary | ICD-10-CM | POA: Diagnosis not present

## 2017-12-25 DIAGNOSIS — J449 Chronic obstructive pulmonary disease, unspecified: Secondary | ICD-10-CM | POA: Diagnosis not present

## 2017-12-25 DIAGNOSIS — M1990 Unspecified osteoarthritis, unspecified site: Secondary | ICD-10-CM | POA: Diagnosis not present

## 2017-12-25 DIAGNOSIS — E1122 Type 2 diabetes mellitus with diabetic chronic kidney disease: Secondary | ICD-10-CM | POA: Diagnosis not present

## 2017-12-26 ENCOUNTER — Ambulatory Visit (INDEPENDENT_AMBULATORY_CARE_PROVIDER_SITE_OTHER): Payer: Medicare HMO | Admitting: Physician Assistant

## 2017-12-26 ENCOUNTER — Encounter: Payer: Self-pay | Admitting: Physician Assistant

## 2017-12-26 VITALS — BP 102/62 | HR 60 | Ht 67.0 in | Wt 193.4 lb

## 2017-12-26 DIAGNOSIS — E785 Hyperlipidemia, unspecified: Secondary | ICD-10-CM | POA: Diagnosis not present

## 2017-12-26 DIAGNOSIS — E1122 Type 2 diabetes mellitus with diabetic chronic kidney disease: Secondary | ICD-10-CM | POA: Diagnosis not present

## 2017-12-26 DIAGNOSIS — I2510 Atherosclerotic heart disease of native coronary artery without angina pectoris: Secondary | ICD-10-CM

## 2017-12-26 DIAGNOSIS — M1990 Unspecified osteoarthritis, unspecified site: Secondary | ICD-10-CM | POA: Diagnosis not present

## 2017-12-26 DIAGNOSIS — N184 Chronic kidney disease, stage 4 (severe): Secondary | ICD-10-CM | POA: Diagnosis not present

## 2017-12-26 DIAGNOSIS — J90 Pleural effusion, not elsewhere classified: Secondary | ICD-10-CM

## 2017-12-26 DIAGNOSIS — M6281 Muscle weakness (generalized): Secondary | ICD-10-CM | POA: Diagnosis not present

## 2017-12-26 DIAGNOSIS — I10 Essential (primary) hypertension: Secondary | ICD-10-CM

## 2017-12-26 DIAGNOSIS — I428 Other cardiomyopathies: Secondary | ICD-10-CM | POA: Diagnosis not present

## 2017-12-26 DIAGNOSIS — I5042 Chronic combined systolic (congestive) and diastolic (congestive) heart failure: Secondary | ICD-10-CM | POA: Diagnosis not present

## 2017-12-26 DIAGNOSIS — I499 Cardiac arrhythmia, unspecified: Secondary | ICD-10-CM | POA: Diagnosis not present

## 2017-12-26 DIAGNOSIS — J449 Chronic obstructive pulmonary disease, unspecified: Secondary | ICD-10-CM | POA: Diagnosis not present

## 2017-12-26 LAB — BASIC METABOLIC PANEL
BUN/Creatinine Ratio: 26 (ref 12–28)
BUN: 76 mg/dL — AB (ref 8–27)
CALCIUM: 10.6 mg/dL — AB (ref 8.7–10.3)
CHLORIDE: 102 mmol/L (ref 96–106)
CO2: 26 mmol/L (ref 20–29)
Creatinine, Ser: 2.94 mg/dL — ABNORMAL HIGH (ref 0.57–1.00)
GFR calc Af Amer: 17 mL/min/{1.73_m2} — ABNORMAL LOW (ref >59)
GFR calc non Af Amer: 15 mL/min/{1.73_m2} — ABNORMAL LOW (ref >59)
Glucose: 60 mg/dL — ABNORMAL LOW (ref 65–99)
POTASSIUM: 3.9 mmol/L (ref 3.5–5.2)
Sodium: 144 mmol/L (ref 134–144)

## 2017-12-26 MED ORDER — METOPROLOL SUCCINATE ER 25 MG PO TB24: 25.0000 mg | ORAL_TABLET | Freq: Every day | ORAL | 5 refills | Status: DC

## 2017-12-26 NOTE — Patient Instructions (Signed)
Medication Instructions:  DISCONTINUE Metoprolol Tartrate  START Metoprolol Succinate 25 mg take 1 tablet once a day   Labwork: Your physician recommends that you return for lab work in: TODAY-BMET  Testing/Procedures: NONE   Follow-Up: Your physician recommends that you schedule a follow-up appointment in: 2 MONTHS with DR HILTY  Any Other Special Instructions Will Be Listed Below (If Applicable). If you need a refill on your cardiac medications before your next appointment, please call your pharmacy.

## 2017-12-26 NOTE — Progress Notes (Signed)
Cardiology Office Note    Date:  12/26/2017   ID:  Mandy Rhodes, DOB [birth year], MRN [id number]  PCP:  Harlan Stains, MD  Cardiologist:  Dr. Debara Pickett  Nephrologist: Dr. Joelyn Oms  Chief Complaint  Patient presents with  . Follow-up    seen for Dr. Debara Pickett, recent admission for CHF    History of Present Illness:  Mandy Rhodes is a 77 y.o. female with PMH of combined systolic and diastolic HF, NICM with baseline EF 30-35% by echo 09/2016, HTN, HLD, DM II, CKD and CAD.  She had a prior cardiac catheterization in May 2017 that showed only 20% proximal LAD.  She was admitted in May for acute CHF exacerbation.  BNP was greater than 10,000.  She underwent IV diuresis however creatinine worsened.  She was switched back to p.o. Lasix.  Weight at the time of discharge was 205 pounds.  She was continued on Lasix 60 mg daily.  She returned back to the hospital in December with shortness of breath.  Echocardiogram obtained on 11/06/2017 revealed EF is now down to 25-30% with severe LVH.  Troponin peaked at 0.47, most likely related to demand ischemia.  She underwent IV diuresis.  She was eventually discharged on Lasix 160 mg twice daily.  She presents today on wheelchair by herself to the cardiology office.  She has 2+ bilateral lower extremity edema, however she says this is the best she has ever seen her legs.  She denies any shortness of breath, she said her shortness of breath has improved.  I will consolidate her short acting metoprolol and the change it to 25 mg daily of Toprol-XL given her LV dysfunction.  Otherwise I will continue her on the current dose of diuretic.  She will need a basic metabolic panel today to evaluate her renal function.  She denies any recent orthopnea or PND.  She denies any recent chest pain.    Past Medical History:  Diagnosis Date  . Anemia   . Arthritis   . CAD (coronary artery disease)    a. minimal CAD by cath in 03/2016  . CHF (congestive heart failure) ([place])     a. 09/2016: Echo with EF of 30-35%, moderate diffuse HK, Grade 2 DD, PA peak pressure of 39 mm Hg.  Marland Kitchen Chronic kidney disease    Renal Insuffiency, see [facility] once a year  . Diabetes mellitus    type 2  . GERD (gastroesophageal reflux disease)   . History of rotator cuff tear    Torn right rotator cuff.  . History of spinal stenosis   . Hypertension   . Infiltrative cardiomyopathy ([place])    a, presumed cardiac amyloidosis by MRI with prior nondiagnostic fat pad biopsy  . Neuropathy associated with endocrine disorder [facility])     Past Surgical History:  Procedure Laterality Date  . ABDOMINAL HYSTERECTOMY  1971   Partial  . ANTERIOR CERVICAL DECOMP/DISCECTOMY FUSION N/A 10/18/2016   Procedure: debridement and drainage cervical prevertebral abscess  ANTERIOR CERVICAL HARDWARE REMOVAL exploration cervical fusion;  Surgeon: Earnie Larsson, MD;  Location: [facility];  Service: Neurosurgery;  Laterality: N/A;  . APPENDECTOMY    . BACK SURGERY  2009   Disectomy  . CARDIAC CATHETERIZATION N/A 03/22/2016   Procedure: Left Heart Cath and Coronary Angiography;  Surgeon: Peter M Martinique, MD;  Location: [facility];  Service: Cardiovascular;  Laterality: N/A;  . CHOLECYSTECTOMY  1969   Status post   . CYST REMOVAL TRUNK  Right 12/03/2014   Procedure: EXCISION OF RIGHT BACK CYST ;  Surgeon: Coralie Keens, MD;  Location: [facility];  Service: General;  Laterality: Right;  . DIRECT LARYNGOSCOPY N/A 10/17/2016   Procedure: DIRECT LARYNGOSCOPY,  ESOPHAGOSCOPY;  Surgeon: [name], MD;  Location: [facility];  Service: ENT;  Laterality: N/A;  . FOOT SURGERY Bilateral    bone removed  . IR GASTROSTOMY TUBE REMOVAL  05/03/2017  . IR GENERIC HISTORICAL  10/25/2016   IR GASTROSTOMY TUBE MOD SED 10/25/2016 [name], MD [facility] RAD  . NEUROPLASTY / TRANSPOSITION MEDIAN NERVE AT CARPAL TUNNEL Bilateral    Hx  Left carpal tunnel repair  . TRACHEOSTOMY TUBE PLACEMENT N/A 10/17/2016   Procedure:  TRACHEOSTOMY;  Surgeon: [name], MD;  Location: [facility];  Service: ENT;  Laterality: N/A;    Current Medications: Outpatient Medications Prior to Visit  Medication Sig Dispense Refill  . acetaminophen (TYLENOL) 325 MG tablet Take 650 mg by mouth every 6 (six) hours as needed for mild pain.    Marland Kitchen aspirin 81 MG EC tablet Chew 81 mg by mouth daily.     . calcitRIOL (ROCALTROL) 0.25 MCG capsule Take 1 capsule (0.25 mcg total) by mouth every Monday, Wednesday, and Friday. 30 capsule 0  . Calcium Carb-Cholecalciferol (CALCIUM 600 + D PO) Take 1 tablet by mouth every morning.     . ferrous sulfate 325 (65 FE) MG EC tablet Take 325 mg by mouth daily.     . furosemide (LASIX) 80 MG tablet Take 2 tablets (160 mg total) by mouth 2 (two) times daily. 120 tablet 0  . glimepiride (AMARYL) 1 MG tablet Take 1 mg by mouth daily.    . Multiple Vitamin (TAB-A-VITE) TABS Take 1 tablet by mouth every morning.     Marland Kitchen omeprazole (PRILOSEC) 40 MG capsule Take 1 capsule (40 mg total) by mouth daily.    . potassium chloride 20 MEQ TBCR Take 20 mEq by mouth daily. 30 tablet 0  . Glycerin-Polysorbate 80 (REFRESH DRY EYE THERAPY OP) Place 1 drop into the left eye 2 (two) times daily.    . metoprolol tartrate (LOPRESSOR) 25 MG tablet Take 0.5 tablets (12.5 mg total) by mouth 2 (two) times daily. 60 tablet 0   No facility-administered medications prior to visit.      Allergies:   Sulfa antibiotics   Social History   Socioeconomic History  . Marital status: Divorced    Spouse name: None  . Number of children: None  . Years of education: None  . Highest education level: None  Social Needs  . Financial resource strain: None  . Food insecurity - worry: None  . Food insecurity - inability: None  . Transportation needs - medical: None  . Transportation needs - non-medical: None  Occupational History  . Occupation: retired  Tobacco Use  . Smoking status: Former Research scientist (life sciences)  . Smokeless tobacco: Never Used  .  Tobacco comment: 12/02/13- quit over 20 years ago  Substance and Sexual Activity  . Alcohol use: No  . Drug use: No  . Sexual activity: Not Currently  Other Topics Concern  . None  Social History Narrative  . None     Family History:  The patient's family history includes Diabetes type II in her mother, other, and sister; Hypertension in her father; Ovarian cancer in her sister.   ROS:   Please see the history of present illness.    ROS All other systems reviewed and are negative.  PHYSICAL EXAM:   VS:  BP 102/62   Pulse 60   Ht 5\' 7"  (1.702 m)   Wt 193 lb 6.4 oz (87.7 kg)   BMI 30.29 kg/m    GEN: Well nourished, well developed, in no acute distress  HEENT: normal  Neck: no JVD, carotid bruits, or masses Cardiac: RRR; no murmurs, rubs, or gallops. 2+ pitting edema in LE  Respiratory:  clear to auscultation bilaterally, normal work of breathing GI: soft, nontender, nondistended, + BS MS: no deformity or atrophy  Skin: warm and dry, no rash Neuro:  Alert and Oriented x 3, Strength and sensation are intact Psych: euthymic mood, full affect  Wt Readings from Last 3 Encounters:  12/26/17 193 lb 6.4 oz (87.7 kg)  11/11/17 228 lb 2.8 oz (103.5 kg)  07/17/17 205 lb (93 kg)      Studies/Labs Reviewed:   EKG:  EKG is ordered today.  The ekg ordered today demonstrates normal sinus rhythm with occasional PVCs.  Recent Labs: 05/15/2017: ALT 22; TSH 2.83 11/07/2017: B Natriuretic Peptide 1,122.8 11/10/2017: Magnesium 2.1 11/11/2017: BUN 40; Creatinine, Ser 2.33; Hemoglobin 12.6; Platelets 97; Potassium 4.0; Sodium 139   Lipid Panel    Component Value Date/Time   CHOL 146 11/06/2017 0322   TRIG 40 11/06/2017 0322   HDL 56 11/06/2017 0322   CHOLHDL 2.6 11/06/2017 0322   VLDL 8 11/06/2017 0322   LDLCALC 82 11/06/2017 0322    Additional studies/ records that were reviewed today include:   Echo 11/06/2017 LV EF: 25% -   30%  Study Conclusions  - Left ventricle: The  cavity size was normal. There was severe   concentric hypertrophy. Systolic function was severely reduced.   The estimated ejection fraction was in the range of 25% to 30%.   Diffuse hypokinesis. Doppler parameters are consistent with   pseudonormal left ventricular relaxation (grade 2 diastolic   dysfunction). The E/e&' ratio is >15, suggesting elevated LV   filling pressure. - Mitral valve: Mildly thickened leaflets . There was mild   regurgitation. - Left atrium: Moderately dilated. - Right ventricle: The cavity size was mildly dilated. Moderately   reduced systolic function. - Right atrium: Severely dilated. - Pulmonary arteries: PA peak pressure: 32 mm Hg (S). - Inferior vena cava: The vessel was dilated. The respirophasic   diameter changes were blunted (< 50%), consistent with elevated   central venous pressure. - Pericardium, extracardiac: There was no pericardial effusion.   There was a left pleural effusion.  Impressions:  - Compared to a prior study in 09/2016, the LVEF is lower at 25-30%   with severe LV wall thickness and global hypokinesis. LV filling   pressures are elevated and there is a left pleural effusion.   ASSESSMENT:    1. Chronic combined systolic and diastolic heart failure ([place])   2. Irregular heart beat   3. CKD (chronic kidney disease) stage 4, GFR 15-29 ml/min (HCC)   4. NICM (nonischemic cardiomyopathy) ([place])   5. Essential hypertension   6. Hyperlipidemia, unspecified hyperlipidemia type   7. Coronary artery disease involving native coronary artery of native heart without angina pectoris   8. Pleural effusion, left      PLAN:  In order of problems listed above:  1. Chronic combined systolic and diastolic heart failure: Continue to have 1-2+ bilateral lower extremity edema, however her lung is clear, she is breathing much better.  Continue on the current dose of diuretic.  2. Nonischemic cardiomyopathy: EF 25-30%,  will consolidate a short  acting metoprolol into Toprol-XL 25 mg daily.  May consider addition of BiDil later if her blood pressure is able to tolerate it.  3. CKD stage IV: Followed by Dr. Joelyn Oms of [facility].  4. Irregular heartbeat: Noticed on physical exam, EKG however showed trigeminy.  Unable to uptitrate beta-blocker any further.  5. CAD: Previous cardiac catheterization only showed a 20% disease in LAD.  Denies any recent chest pain, no further workup is needed at this time.  6. Hypertension: Blood pressure borderline low, unable to uptitrate beta-blocker.  Will consolidate short acting metoprolol to metoprolol succinate 25 mg daily  7. Hyperlipidemia: Currently not on statin.  Lipid panel obtained on 11/06/2017 showed a total cholesterol 146, triglyceride 40, HDL 56, LDL 82.  Fairly controlled.  8. Left pleural effusion: Seen on the recent CT of the chest, amount of pleural effusion is stable compared to previous image.  We will continue on the current dose of diuretic.    Medication Adjustments/Labs and Tests Ordered: Current medicines are reviewed at length with the patient today.  Concerns regarding medicines are outlined above.  Medication changes, Labs and Tests ordered today are listed in the Patient Instructions below. Patient Instructions  Medication Instructions:  DISCONTINUE Metoprolol Tartrate  START Metoprolol Succinate 25 mg take 1 tablet once a day   Labwork: Your physician recommends that you return for lab work in: TODAY-BMET  Testing/Procedures: NONE   Follow-Up: Your physician recommends that you schedule a follow-up appointment in: 2 MONTHS with DR HILTY  Any Other Special Instructions Will Be Listed Below (If Applicable). If you need a refill on your cardiac medications before your next appointment, please call your pharmacy.     Hilbert Corrigan, [place]  12/26/2017 1:49 PM    [facility] [address], [place], [name]  [address] Phone:  [phone]; Fax: [phone]

## 2017-12-27 DIAGNOSIS — J449 Chronic obstructive pulmonary disease, unspecified: Secondary | ICD-10-CM | POA: Diagnosis not present

## 2017-12-27 DIAGNOSIS — M1990 Unspecified osteoarthritis, unspecified site: Secondary | ICD-10-CM | POA: Diagnosis not present

## 2017-12-27 DIAGNOSIS — M6281 Muscle weakness (generalized): Secondary | ICD-10-CM | POA: Diagnosis not present

## 2017-12-27 DIAGNOSIS — E1122 Type 2 diabetes mellitus with diabetic chronic kidney disease: Secondary | ICD-10-CM | POA: Diagnosis not present

## 2017-12-28 ENCOUNTER — Telehealth: Payer: Self-pay | Admitting: Internal Medicine

## 2017-12-28 DIAGNOSIS — J449 Chronic obstructive pulmonary disease, unspecified: Secondary | ICD-10-CM | POA: Diagnosis not present

## 2017-12-28 DIAGNOSIS — M1990 Unspecified osteoarthritis, unspecified site: Secondary | ICD-10-CM | POA: Diagnosis not present

## 2017-12-28 DIAGNOSIS — E1122 Type 2 diabetes mellitus with diabetic chronic kidney disease: Secondary | ICD-10-CM | POA: Diagnosis not present

## 2017-12-28 DIAGNOSIS — M6281 Muscle weakness (generalized): Secondary | ICD-10-CM | POA: Diagnosis not present

## 2017-12-28 NOTE — Telephone Encounter (Signed)
Patient called w/results. She states she saw Dr. Joelyn Oms last month, he ordered labs, made no med changes. Advised would forward these most recent results to him to review. She voiced understanding.

## 2017-12-28 NOTE — Telephone Encounter (Signed)
New message    Patient returning call back to nurse on yesterday -lab results

## 2017-12-28 NOTE — Telephone Encounter (Signed)
Notes recorded by Pixie Casino, MD on 12/27/2017 at 11:39 AM EST BUN and creatinine significantly elevated - will need follow-up soon with Dr. Joelyn Oms.  Dr. Lemmie Evens ------

## 2017-12-31 DIAGNOSIS — E1122 Type 2 diabetes mellitus with diabetic chronic kidney disease: Secondary | ICD-10-CM | POA: Diagnosis not present

## 2017-12-31 DIAGNOSIS — J449 Chronic obstructive pulmonary disease, unspecified: Secondary | ICD-10-CM | POA: Diagnosis not present

## 2017-12-31 DIAGNOSIS — M1990 Unspecified osteoarthritis, unspecified site: Secondary | ICD-10-CM | POA: Diagnosis not present

## 2017-12-31 DIAGNOSIS — M6281 Muscle weakness (generalized): Secondary | ICD-10-CM | POA: Diagnosis not present

## 2018-01-02 DIAGNOSIS — E1122 Type 2 diabetes mellitus with diabetic chronic kidney disease: Secondary | ICD-10-CM | POA: Diagnosis not present

## 2018-01-02 DIAGNOSIS — M6281 Muscle weakness (generalized): Secondary | ICD-10-CM | POA: Diagnosis not present

## 2018-01-02 DIAGNOSIS — M1990 Unspecified osteoarthritis, unspecified site: Secondary | ICD-10-CM | POA: Diagnosis not present

## 2018-01-02 DIAGNOSIS — J449 Chronic obstructive pulmonary disease, unspecified: Secondary | ICD-10-CM | POA: Diagnosis not present

## 2018-01-03 DIAGNOSIS — E1122 Type 2 diabetes mellitus with diabetic chronic kidney disease: Secondary | ICD-10-CM | POA: Diagnosis not present

## 2018-01-03 DIAGNOSIS — M1990 Unspecified osteoarthritis, unspecified site: Secondary | ICD-10-CM | POA: Diagnosis not present

## 2018-01-03 DIAGNOSIS — M6281 Muscle weakness (generalized): Secondary | ICD-10-CM | POA: Diagnosis not present

## 2018-01-03 DIAGNOSIS — J449 Chronic obstructive pulmonary disease, unspecified: Secondary | ICD-10-CM | POA: Diagnosis not present

## 2018-01-04 DIAGNOSIS — E1122 Type 2 diabetes mellitus with diabetic chronic kidney disease: Secondary | ICD-10-CM | POA: Diagnosis not present

## 2018-01-04 DIAGNOSIS — M6281 Muscle weakness (generalized): Secondary | ICD-10-CM | POA: Diagnosis not present

## 2018-01-04 DIAGNOSIS — M1990 Unspecified osteoarthritis, unspecified site: Secondary | ICD-10-CM | POA: Diagnosis not present

## 2018-01-04 DIAGNOSIS — J449 Chronic obstructive pulmonary disease, unspecified: Secondary | ICD-10-CM | POA: Diagnosis not present

## 2018-01-07 ENCOUNTER — Telehealth: Payer: Self-pay

## 2018-01-07 ENCOUNTER — Inpatient Hospital Stay: Payer: Medicare HMO | Attending: Hematology and Oncology | Admitting: Hematology and Oncology

## 2018-01-07 ENCOUNTER — Inpatient Hospital Stay: Payer: Medicare HMO

## 2018-01-07 ENCOUNTER — Encounter: Payer: Self-pay | Admitting: Hematology and Oncology

## 2018-01-07 VITALS — BP 107/35 | HR 62 | Temp 97.4°F | Resp 17 | Ht 67.0 in | Wt 190.1 lb

## 2018-01-07 DIAGNOSIS — G629 Polyneuropathy, unspecified: Secondary | ICD-10-CM | POA: Insufficient documentation

## 2018-01-07 DIAGNOSIS — D72819 Decreased white blood cell count, unspecified: Secondary | ICD-10-CM

## 2018-01-07 DIAGNOSIS — K219 Gastro-esophageal reflux disease without esophagitis: Secondary | ICD-10-CM | POA: Diagnosis not present

## 2018-01-07 DIAGNOSIS — Z87891 Personal history of nicotine dependence: Secondary | ICD-10-CM | POA: Diagnosis not present

## 2018-01-07 DIAGNOSIS — D696 Thrombocytopenia, unspecified: Secondary | ICD-10-CM

## 2018-01-07 DIAGNOSIS — I130 Hypertensive heart and chronic kidney disease with heart failure and stage 1 through stage 4 chronic kidney disease, or unspecified chronic kidney disease: Secondary | ICD-10-CM | POA: Diagnosis not present

## 2018-01-07 DIAGNOSIS — Z79899 Other long term (current) drug therapy: Secondary | ICD-10-CM | POA: Diagnosis not present

## 2018-01-07 DIAGNOSIS — Z8041 Family history of malignant neoplasm of ovary: Secondary | ICD-10-CM

## 2018-01-07 DIAGNOSIS — I429 Cardiomyopathy, unspecified: Secondary | ICD-10-CM | POA: Diagnosis not present

## 2018-01-07 DIAGNOSIS — N183 Chronic kidney disease, stage 3 (moderate): Secondary | ICD-10-CM | POA: Diagnosis not present

## 2018-01-07 DIAGNOSIS — E1122 Type 2 diabetes mellitus with diabetic chronic kidney disease: Secondary | ICD-10-CM

## 2018-01-07 DIAGNOSIS — M1990 Unspecified osteoarthritis, unspecified site: Secondary | ICD-10-CM | POA: Diagnosis not present

## 2018-01-07 DIAGNOSIS — I2510 Atherosclerotic heart disease of native coronary artery without angina pectoris: Secondary | ICD-10-CM | POA: Diagnosis not present

## 2018-01-07 DIAGNOSIS — J449 Chronic obstructive pulmonary disease, unspecified: Secondary | ICD-10-CM | POA: Diagnosis not present

## 2018-01-07 DIAGNOSIS — M6281 Muscle weakness (generalized): Secondary | ICD-10-CM | POA: Diagnosis not present

## 2018-01-07 LAB — CBC WITH DIFFERENTIAL (CANCER CENTER ONLY)
Basophils Absolute: 0 10*3/uL (ref 0.0–0.1)
Basophils Relative: 1 %
EOS PCT: 2 %
Eosinophils Absolute: 0.1 10*3/uL (ref 0.0–0.5)
HEMATOCRIT: 35 % (ref 34.8–46.6)
Hemoglobin: 12 g/dL (ref 11.6–15.9)
LYMPHS ABS: 1 10*3/uL (ref 0.9–3.3)
LYMPHS PCT: 35 %
MCH: 30.6 pg (ref 25.1–34.0)
MCHC: 34.3 g/dL (ref 31.5–36.0)
MCV: 89.3 fL (ref 79.5–101.0)
Monocytes Absolute: 0.3 10*3/uL (ref 0.1–0.9)
Monocytes Relative: 12 %
Neutro Abs: 1.4 10*3/uL — ABNORMAL LOW (ref 1.5–6.5)
Neutrophils Relative %: 50 %
Platelet Count: 85 10*3/uL — ABNORMAL LOW (ref 145–400)
RBC: 3.92 MIL/uL (ref 3.70–5.45)
RDW: 18.2 % — AB (ref 11.2–14.5)
WBC: 2.7 10*3/uL — AB (ref 3.9–10.3)
nRBC: 0 /100 WBC

## 2018-01-07 LAB — LACTATE DEHYDROGENASE: LDH: 212 U/L (ref 125–245)

## 2018-01-07 LAB — CMP (CANCER CENTER ONLY)
ALBUMIN: 3.7 g/dL (ref 3.5–5.0)
ALT: 21 U/L (ref 0–55)
AST: 27 U/L (ref 5–34)
Alkaline Phosphatase: 126 U/L (ref 40–150)
Anion gap: 13 — ABNORMAL HIGH (ref 3–11)
BUN: 71 mg/dL — AB (ref 7–26)
CHLORIDE: 103 mmol/L (ref 98–109)
CO2: 29 mmol/L (ref 22–29)
Calcium: 10.7 mg/dL — ABNORMAL HIGH (ref 8.4–10.4)
Creatinine: 3.11 mg/dL (ref 0.60–1.10)
GFR, EST NON AFRICAN AMERICAN: 14 mL/min — AB (ref >60)
GFR, Est AFR Am: 16 mL/min — ABNORMAL LOW (ref >60)
Glucose, Bld: 60 mg/dL — ABNORMAL LOW (ref 70–140)
POTASSIUM: 4.1 mmol/L (ref 3.5–5.1)
Sodium: 145 mmol/L (ref 136–145)
Total Bilirubin: 1.5 mg/dL — ABNORMAL HIGH (ref 0.2–1.2)
Total Protein: 7.5 g/dL (ref 6.4–8.3)

## 2018-01-07 NOTE — Telephone Encounter (Signed)
Call received from lab for patient's creatinine of 3.1. Dr. Lebron Conners made aware. No further orders at this time.

## 2018-01-07 NOTE — Telephone Encounter (Signed)
Printed avs and calender for upcoming appointment. Per 2/18 los 

## 2018-01-08 DIAGNOSIS — H43813 Vitreous degeneration, bilateral: Secondary | ICD-10-CM | POA: Diagnosis not present

## 2018-01-08 DIAGNOSIS — I5042 Chronic combined systolic (congestive) and diastolic (congestive) heart failure: Secondary | ICD-10-CM | POA: Diagnosis not present

## 2018-01-08 DIAGNOSIS — H3582 Retinal ischemia: Secondary | ICD-10-CM | POA: Diagnosis not present

## 2018-01-08 DIAGNOSIS — R262 Difficulty in walking, not elsewhere classified: Secondary | ICD-10-CM | POA: Diagnosis not present

## 2018-01-08 DIAGNOSIS — E113511 Type 2 diabetes mellitus with proliferative diabetic retinopathy with macular edema, right eye: Secondary | ICD-10-CM | POA: Diagnosis not present

## 2018-01-08 DIAGNOSIS — M6281 Muscle weakness (generalized): Secondary | ICD-10-CM | POA: Diagnosis not present

## 2018-01-08 DIAGNOSIS — E113592 Type 2 diabetes mellitus with proliferative diabetic retinopathy without macular edema, left eye: Secondary | ICD-10-CM | POA: Diagnosis not present

## 2018-01-08 LAB — KAPPA/LAMBDA LIGHT CHAINS
KAPPA FREE LGHT CHN: 85.9 mg/L — AB (ref 3.3–19.4)
Kappa, lambda light chain ratio: 2.63 — ABNORMAL HIGH (ref 0.26–1.65)
LAMDA FREE LIGHT CHAINS: 32.6 mg/L — AB (ref 5.7–26.3)

## 2018-01-08 LAB — PATHOLOGIST SMEAR REVIEW

## 2018-01-09 DIAGNOSIS — M1990 Unspecified osteoarthritis, unspecified site: Secondary | ICD-10-CM | POA: Diagnosis not present

## 2018-01-09 DIAGNOSIS — E1122 Type 2 diabetes mellitus with diabetic chronic kidney disease: Secondary | ICD-10-CM | POA: Diagnosis not present

## 2018-01-09 DIAGNOSIS — J449 Chronic obstructive pulmonary disease, unspecified: Secondary | ICD-10-CM | POA: Diagnosis not present

## 2018-01-09 DIAGNOSIS — M6281 Muscle weakness (generalized): Secondary | ICD-10-CM | POA: Diagnosis not present

## 2018-01-10 DIAGNOSIS — E1122 Type 2 diabetes mellitus with diabetic chronic kidney disease: Secondary | ICD-10-CM | POA: Diagnosis not present

## 2018-01-10 DIAGNOSIS — J449 Chronic obstructive pulmonary disease, unspecified: Secondary | ICD-10-CM | POA: Diagnosis not present

## 2018-01-10 DIAGNOSIS — M1990 Unspecified osteoarthritis, unspecified site: Secondary | ICD-10-CM | POA: Diagnosis not present

## 2018-01-10 DIAGNOSIS — M6281 Muscle weakness (generalized): Secondary | ICD-10-CM | POA: Diagnosis not present

## 2018-01-10 LAB — MULTIPLE MYELOMA PANEL, SERUM
ALBUMIN SERPL ELPH-MCNC: 3.8 g/dL (ref 2.9–4.4)
ALBUMIN/GLOB SERPL: 1.1 (ref 0.7–1.7)
ALPHA 1: 0.2 g/dL (ref 0.0–0.4)
ALPHA2 GLOB SERPL ELPH-MCNC: 0.5 g/dL (ref 0.4–1.0)
B-GLOBULIN SERPL ELPH-MCNC: 1 g/dL (ref 0.7–1.3)
GAMMA GLOB SERPL ELPH-MCNC: 1.7 g/dL (ref 0.4–1.8)
Globulin, Total: 3.5 g/dL (ref 2.2–3.9)
IGG (IMMUNOGLOBIN G), SERUM: 1643 mg/dL — AB (ref 700–1600)
IgA: 435 mg/dL — ABNORMAL HIGH (ref 64–422)
IgM (Immunoglobulin M), Srm: 117 mg/dL (ref 26–217)
Total Protein ELP: 7.3 g/dL (ref 6.0–8.5)

## 2018-01-11 DIAGNOSIS — J449 Chronic obstructive pulmonary disease, unspecified: Secondary | ICD-10-CM | POA: Diagnosis not present

## 2018-01-11 DIAGNOSIS — E1122 Type 2 diabetes mellitus with diabetic chronic kidney disease: Secondary | ICD-10-CM | POA: Diagnosis not present

## 2018-01-11 DIAGNOSIS — M6281 Muscle weakness (generalized): Secondary | ICD-10-CM | POA: Diagnosis not present

## 2018-01-11 DIAGNOSIS — M1990 Unspecified osteoarthritis, unspecified site: Secondary | ICD-10-CM | POA: Diagnosis not present

## 2018-01-14 DIAGNOSIS — J449 Chronic obstructive pulmonary disease, unspecified: Secondary | ICD-10-CM | POA: Diagnosis not present

## 2018-01-14 DIAGNOSIS — E1122 Type 2 diabetes mellitus with diabetic chronic kidney disease: Secondary | ICD-10-CM | POA: Diagnosis not present

## 2018-01-14 DIAGNOSIS — M6281 Muscle weakness (generalized): Secondary | ICD-10-CM | POA: Diagnosis not present

## 2018-01-14 DIAGNOSIS — M1990 Unspecified osteoarthritis, unspecified site: Secondary | ICD-10-CM | POA: Diagnosis not present

## 2018-01-15 DIAGNOSIS — E1122 Type 2 diabetes mellitus with diabetic chronic kidney disease: Secondary | ICD-10-CM | POA: Diagnosis not present

## 2018-01-15 DIAGNOSIS — M1990 Unspecified osteoarthritis, unspecified site: Secondary | ICD-10-CM | POA: Diagnosis not present

## 2018-01-15 DIAGNOSIS — M6281 Muscle weakness (generalized): Secondary | ICD-10-CM | POA: Diagnosis not present

## 2018-01-15 DIAGNOSIS — I471 Supraventricular tachycardia: Secondary | ICD-10-CM | POA: Diagnosis not present

## 2018-01-15 DIAGNOSIS — J449 Chronic obstructive pulmonary disease, unspecified: Secondary | ICD-10-CM | POA: Diagnosis not present

## 2018-01-16 DIAGNOSIS — J449 Chronic obstructive pulmonary disease, unspecified: Secondary | ICD-10-CM | POA: Diagnosis not present

## 2018-01-16 DIAGNOSIS — M6281 Muscle weakness (generalized): Secondary | ICD-10-CM | POA: Diagnosis not present

## 2018-01-16 DIAGNOSIS — E1122 Type 2 diabetes mellitus with diabetic chronic kidney disease: Secondary | ICD-10-CM | POA: Diagnosis not present

## 2018-01-16 DIAGNOSIS — M1990 Unspecified osteoarthritis, unspecified site: Secondary | ICD-10-CM | POA: Diagnosis not present

## 2018-01-17 ENCOUNTER — Encounter: Payer: Self-pay | Admitting: Pulmonary Disease

## 2018-01-17 ENCOUNTER — Ambulatory Visit (INDEPENDENT_AMBULATORY_CARE_PROVIDER_SITE_OTHER): Payer: Medicare HMO | Admitting: Pulmonary Disease

## 2018-01-17 VITALS — BP 132/76 | HR 62 | Ht 67.0 in | Wt 190.4 lb

## 2018-01-17 DIAGNOSIS — J90 Pleural effusion, not elsewhere classified: Secondary | ICD-10-CM

## 2018-01-17 NOTE — Patient Instructions (Addendum)
I have reviewed your CT scan and chest x-ray which shows an effusion on the left. Suspect this may be from heart failure and kidney disease We will continue to monitor this closely.  If this gets bigger then we may need to do a thoracentesis Follow-up in 3 months with chest x-ray.

## 2018-01-17 NOTE — Progress Notes (Signed)
Mandy Rhodes    [id number]    08-12-1941  Primary Care Physician:White, Caren Griffins, MD  Referring Physician: Harlan Stains, MD [place], [place] [address]  Chief complaint: Consult for pleural effusion  HPI: 77 year old with history of systolic, diastolic heart failure, EF 30-35%, stage IV chronic kidney disease  recent admission in December 2018 with acute heart failure, suspected community-acquired pneumonia.  She was diuresed aggressively in consultation with cardiology and nephrology.  Chest x-ray at that time noted with left pleural effusion which has improved on follow-up chest x-ray December 10, 2017 She had a CT scan this month which showed persistent left effusion and sent to pulmonary for further evaluation  History noted for admission in 2017 with acute respiratory failure, aspiration pneumonia, non-ST elevation MI She has about 33-pack-year smoking history.  Quit in the mid 1990s.  Outpatient Encounter Medications as of 01/17/2018  Medication Sig  . acetaminophen (TYLENOL) 325 MG tablet Take 650 mg by mouth every 6 (six) hours as needed for mild pain.  Marland Kitchen aspirin 81 MG EC tablet Chew 81 mg by mouth daily.   . calcitRIOL (ROCALTROL) 0.25 MCG capsule Take 1 capsule (0.25 mcg total) by mouth every Monday, Wednesday, and Friday.  . Calcium Carb-Cholecalciferol (CALCIUM 600 + D PO) Take 1 tablet by mouth every morning.   . ferrous sulfate 325 (65 FE) MG EC tablet Take 325 mg by mouth daily.   . furosemide (LASIX) 80 MG tablet Take 2 tablets (160 mg total) by mouth 2 (two) times daily.  Marland Kitchen glimepiride (AMARYL) 1 MG tablet Take 1 mg by mouth daily.  . metoprolol succinate (TOPROL XL) 25 MG 24 hr tablet Take 1 tablet (25 mg total) by mouth daily.  . Multiple Vitamin (TAB-A-VITE) TABS Take 1 tablet by mouth every morning.   Marland Kitchen omeprazole (PRILOSEC) 40 MG capsule Take 1 capsule (40 mg total) by mouth daily.  . potassium chloride 20 MEQ TBCR Take 20  mEq by mouth daily.   No facility-administered encounter medications on file as of 01/17/2018.     Allergies as of 01/17/2018 - Review Complete 01/17/2018  Allergen Reaction Noted  . Sulfa antibiotics Swelling 09/01/2011    Past Medical History:  Diagnosis Date  . Anemia   . Arthritis   . CAD (coronary artery disease)    a. minimal CAD by cath in 03/2016  . CHF (congestive heart failure) ([place])    a. 09/2016: Echo with EF of 30-35%, moderate diffuse HK, Grade 2 DD, PA peak pressure of 39 mm Hg.  Marland Kitchen Chronic kidney disease    Renal Insuffiency, see [facility] once a year  . Diabetes mellitus    type 2  . GERD (gastroesophageal reflux disease)   . History of rotator cuff tear    Torn right rotator cuff.  . History of spinal stenosis   . Hypertension   . Infiltrative cardiomyopathy ([place])    a, presumed cardiac amyloidosis by MRI with prior nondiagnostic fat pad biopsy  . Neuropathy associated with endocrine disorder [facility])     Past Surgical History:  Procedure Laterality Date  . ABDOMINAL HYSTERECTOMY  1971   Partial  . ANTERIOR CERVICAL DECOMP/DISCECTOMY FUSION N/A 10/18/2016   Procedure: debridement and drainage cervical prevertebral abscess  ANTERIOR CERVICAL HARDWARE REMOVAL exploration cervical fusion;  Surgeon: Earnie Larsson, MD;  Location: [facility];  Service: Neurosurgery;  Laterality: N/A;  . APPENDECTOMY    . BACK SURGERY  2009   Disectomy  . CARDIAC CATHETERIZATION N/A 03/22/2016   Procedure: Left Heart Cath and Coronary Angiography;  Surgeon: Peter M Martinique, MD;  Location: [facility];  Service: Cardiovascular;  Laterality: N/A;  . CHOLECYSTECTOMY  1969   Status post   . CYST REMOVAL TRUNK Right 12/03/2014   Procedure: EXCISION OF RIGHT BACK CYST ;  Surgeon: Coralie Keens, MD;  Location: [facility];  Service: General;  Laterality: Right;  . DIRECT LARYNGOSCOPY N/A 10/17/2016   Procedure: DIRECT LARYNGOSCOPY,  ESOPHAGOSCOPY;  Surgeon: [name], MD;  [facility];  Service: ENT;  Laterality: N/A;  . FOOT SURGERY Bilateral    bone removed  . IR GASTROSTOMY TUBE REMOVAL  05/03/2017  . IR GENERIC HISTORICAL  10/25/2016   IR GASTROSTOMY TUBE MOD SED 10/25/2016 [name], MD [facility]-INTER[facility] RAD  . NEUROPLASTY / TRANSPOSITION MEDIAN NERVE AT CARPAL TUNNEL Bilateral    Hx  Left carpal tunnel repair  . TRACHEOSTOMY TUBE PLACEMENT N/A 10/17/2016   Procedure: TRACHEOSTOMY;  Surgeon: [name], MD;  [facility];  Service: ENT;  Laterality: N/A;    Family History  Problem Relation Age of Onset  . Diabetes type II Mother   . Hypertension Father   . Diabetes type II Sister   . Ovarian cancer Sister   . Diabetes type II Other   . Stroke Neg Hx   . CAD Neg Hx   . Heart failure Neg Hx     Social History   Socioeconomic History  . Marital status: Divorced    Spouse name: Not on file  . Number of children: Not on file  . Years of education: Not on file  . Highest education level: Not on file  Social Needs  . Financial resource strain: Not on file  . Food insecurity - worry: Not on file  . Food insecurity - inability: Not on file  . Transportation needs - medical: Not on file  . Transportation needs - non-medical: Not on file  Occupational History  . Occupation: retired  Tobacco Use  . Smoking status: Former Smoker    Packs/day: 0.25    Years: 33.00    Pack years: 8.25    Types: Cigarettes  . Smokeless tobacco: Never Used  . Tobacco comment: 12/02/13- quit over 20 years ago/smoked 33 years on and off  Substance and Sexual Activity  . Alcohol use: No  . Drug use: No  . Sexual activity: Not Currently  Other Topics Concern  . Not on file  Social History Narrative  . Not on file    Review of systems: Review of Systems  Constitutional: Negative for fever and chills.  HENT: Negative.   Eyes: Negative for blurred vision.  Respiratory: as per HPI  Cardiovascular: Negative for chest pain and palpitations.  Gastrointestinal:  Negative for vomiting, diarrhea, blood per rectum. Genitourinary: Negative for dysuria, urgency, frequency and hematuria.  Musculoskeletal: Negative for myalgias, back pain and joint pain.  Skin: Negative for itching and rash.  Neurological: Negative for dizziness, tremors, focal weakness, seizures and loss of consciousness.  Endo/Heme/Allergies: Negative for environmental allergies.  Psychiatric/Behavioral: Negative for depression, suicidal ideas and hallucinations.  All other systems reviewed and are negative.  Physical Exam: Blood pressure 132/76, pulse 62, height 5\' 7"  (1.702 m), weight 190 lb 6.4 oz (86.4 kg), SpO2 92 %. Gen:      No acute distress HEENT:  EOMI, sclera anicteric Neck:     No masses; no thyromegaly Lungs:  Clear to auscultation bilaterally; normal respiratory effort CV:         Regular rate and rhythm; no murmurs Abd:      + bowel sounds; soft, non-tender; no palpable masses, no distension Ext:    No edema; adequate peripheral perfusion Skin:      Warm and dry; no rash Neuro: alert and oriented x 3 Psych: normal mood and affect  Data Reviewed: CT chest 10/13/16-dense consolidation in the left lower lobe CT angio 11/17/16- small bilateral pleural effusion, patchy airspace opacity CT chest 12/24/17- moderate left pleural effusion, passive atelectasis of the left lower lobe.  Trace right effusion.  Cardiomegaly with mild interstitial pulmonary edema, CHF.  Assessment:  Pleural effusion CT noted for moderate effusion on the left and trace effusion on the right, associated with atelectasis.  No evidence of acute pneumonia and clinically she appears well Suspect effusion is secondary to chronic heart failure, volume overload, chronic kidney disease There is no obvious underlying mass suspicious for malignancy We discussed invasive procedures such as thoracentesis but she would like to avoid for now.  We will continue to monitor this closely.  Repeat chest x-ray in 3  months or sooner if she develops symptoms of dyspnea.  Reconsider thoracentesis if the effusion is bigger  Plan/Recommendations: - Follow up CXR in 3 months.   Marshell Garfinkel MD [facility] Pager [phone] 01/17/2018, 2:36 PM  CC: Harlan Stains, MD

## 2018-01-18 DIAGNOSIS — E1122 Type 2 diabetes mellitus with diabetic chronic kidney disease: Secondary | ICD-10-CM | POA: Diagnosis not present

## 2018-01-18 DIAGNOSIS — M6281 Muscle weakness (generalized): Secondary | ICD-10-CM | POA: Diagnosis not present

## 2018-01-18 DIAGNOSIS — M1990 Unspecified osteoarthritis, unspecified site: Secondary | ICD-10-CM | POA: Diagnosis not present

## 2018-01-18 DIAGNOSIS — J449 Chronic obstructive pulmonary disease, unspecified: Secondary | ICD-10-CM | POA: Diagnosis not present

## 2018-01-21 DIAGNOSIS — M6281 Muscle weakness (generalized): Secondary | ICD-10-CM | POA: Diagnosis not present

## 2018-01-21 DIAGNOSIS — M1990 Unspecified osteoarthritis, unspecified site: Secondary | ICD-10-CM | POA: Diagnosis not present

## 2018-01-21 DIAGNOSIS — E1122 Type 2 diabetes mellitus with diabetic chronic kidney disease: Secondary | ICD-10-CM | POA: Diagnosis not present

## 2018-01-21 DIAGNOSIS — J449 Chronic obstructive pulmonary disease, unspecified: Secondary | ICD-10-CM | POA: Diagnosis not present

## 2018-01-22 ENCOUNTER — Encounter: Payer: Self-pay | Admitting: Hematology and Oncology

## 2018-01-22 ENCOUNTER — Telehealth: Payer: Self-pay | Admitting: Hematology and Oncology

## 2018-01-22 ENCOUNTER — Other Ambulatory Visit: Payer: Self-pay

## 2018-01-22 ENCOUNTER — Inpatient Hospital Stay: Payer: Medicare HMO | Attending: Hematology and Oncology | Admitting: Hematology and Oncology

## 2018-01-22 VITALS — BP 96/62 | HR 56 | Temp 97.9°F | Resp 24 | Ht 67.0 in

## 2018-01-22 DIAGNOSIS — Z79899 Other long term (current) drug therapy: Secondary | ICD-10-CM

## 2018-01-22 DIAGNOSIS — Z87891 Personal history of nicotine dependence: Secondary | ICD-10-CM | POA: Diagnosis not present

## 2018-01-22 DIAGNOSIS — I129 Hypertensive chronic kidney disease with stage 1 through stage 4 chronic kidney disease, or unspecified chronic kidney disease: Secondary | ICD-10-CM | POA: Diagnosis not present

## 2018-01-22 DIAGNOSIS — I2510 Atherosclerotic heart disease of native coronary artery without angina pectoris: Secondary | ICD-10-CM | POA: Diagnosis not present

## 2018-01-22 DIAGNOSIS — E1121 Type 2 diabetes mellitus with diabetic nephropathy: Secondary | ICD-10-CM

## 2018-01-22 DIAGNOSIS — Z7982 Long term (current) use of aspirin: Secondary | ICD-10-CM | POA: Diagnosis not present

## 2018-01-22 DIAGNOSIS — Z8041 Family history of malignant neoplasm of ovary: Secondary | ICD-10-CM

## 2018-01-22 DIAGNOSIS — I429 Cardiomyopathy, unspecified: Secondary | ICD-10-CM | POA: Diagnosis not present

## 2018-01-22 DIAGNOSIS — K219 Gastro-esophageal reflux disease without esophagitis: Secondary | ICD-10-CM

## 2018-01-22 DIAGNOSIS — D696 Thrombocytopenia, unspecified: Secondary | ICD-10-CM | POA: Diagnosis not present

## 2018-01-22 DIAGNOSIS — M4802 Spinal stenosis, cervical region: Secondary | ICD-10-CM | POA: Diagnosis not present

## 2018-01-22 DIAGNOSIS — E213 Hyperparathyroidism, unspecified: Secondary | ICD-10-CM

## 2018-01-22 DIAGNOSIS — D72819 Decreased white blood cell count, unspecified: Secondary | ICD-10-CM

## 2018-01-22 DIAGNOSIS — E11319 Type 2 diabetes mellitus with unspecified diabetic retinopathy without macular edema: Secondary | ICD-10-CM

## 2018-01-22 DIAGNOSIS — E785 Hyperlipidemia, unspecified: Secondary | ICD-10-CM | POA: Diagnosis not present

## 2018-01-22 DIAGNOSIS — E1140 Type 2 diabetes mellitus with diabetic neuropathy, unspecified: Secondary | ICD-10-CM | POA: Diagnosis not present

## 2018-01-22 DIAGNOSIS — I5040 Unspecified combined systolic (congestive) and diastolic (congestive) heart failure: Secondary | ICD-10-CM

## 2018-01-22 DIAGNOSIS — N183 Chronic kidney disease, stage 3 (moderate): Secondary | ICD-10-CM | POA: Diagnosis not present

## 2018-01-22 DIAGNOSIS — Z7984 Long term (current) use of oral hypoglycemic drugs: Secondary | ICD-10-CM | POA: Diagnosis not present

## 2018-01-22 NOTE — Telephone Encounter (Signed)
Scheduled appt per 3/5 los - Gave patient AVS and calender per los. - Central radiology to contact patient with ct biopsy .

## 2018-01-23 DIAGNOSIS — J449 Chronic obstructive pulmonary disease, unspecified: Secondary | ICD-10-CM | POA: Diagnosis not present

## 2018-01-23 DIAGNOSIS — E1122 Type 2 diabetes mellitus with diabetic chronic kidney disease: Secondary | ICD-10-CM | POA: Diagnosis not present

## 2018-01-23 DIAGNOSIS — M1990 Unspecified osteoarthritis, unspecified site: Secondary | ICD-10-CM | POA: Diagnosis not present

## 2018-01-23 DIAGNOSIS — M6281 Muscle weakness (generalized): Secondary | ICD-10-CM | POA: Diagnosis not present

## 2018-01-24 DIAGNOSIS — E1122 Type 2 diabetes mellitus with diabetic chronic kidney disease: Secondary | ICD-10-CM | POA: Diagnosis not present

## 2018-01-24 DIAGNOSIS — K219 Gastro-esophageal reflux disease without esophagitis: Secondary | ICD-10-CM | POA: Diagnosis not present

## 2018-01-24 DIAGNOSIS — K08409 Partial loss of teeth, unspecified cause, unspecified class: Secondary | ICD-10-CM | POA: Diagnosis not present

## 2018-01-24 DIAGNOSIS — E1140 Type 2 diabetes mellitus with diabetic neuropathy, unspecified: Secondary | ICD-10-CM | POA: Diagnosis not present

## 2018-01-24 DIAGNOSIS — J449 Chronic obstructive pulmonary disease, unspecified: Secondary | ICD-10-CM | POA: Diagnosis not present

## 2018-01-24 DIAGNOSIS — I509 Heart failure, unspecified: Secondary | ICD-10-CM | POA: Diagnosis not present

## 2018-01-24 DIAGNOSIS — R32 Unspecified urinary incontinence: Secondary | ICD-10-CM | POA: Diagnosis not present

## 2018-01-24 DIAGNOSIS — I110 Hypertensive heart disease with heart failure: Secondary | ICD-10-CM | POA: Diagnosis not present

## 2018-01-24 DIAGNOSIS — Z7982 Long term (current) use of aspirin: Secondary | ICD-10-CM | POA: Diagnosis not present

## 2018-01-24 DIAGNOSIS — D649 Anemia, unspecified: Secondary | ICD-10-CM | POA: Diagnosis not present

## 2018-01-24 DIAGNOSIS — H547 Unspecified visual loss: Secondary | ICD-10-CM | POA: Diagnosis not present

## 2018-01-24 DIAGNOSIS — M1990 Unspecified osteoarthritis, unspecified site: Secondary | ICD-10-CM | POA: Diagnosis not present

## 2018-01-24 DIAGNOSIS — M6281 Muscle weakness (generalized): Secondary | ICD-10-CM | POA: Diagnosis not present

## 2018-01-24 DIAGNOSIS — E876 Hypokalemia: Secondary | ICD-10-CM | POA: Diagnosis not present

## 2018-01-25 DIAGNOSIS — J449 Chronic obstructive pulmonary disease, unspecified: Secondary | ICD-10-CM | POA: Diagnosis not present

## 2018-01-25 DIAGNOSIS — E1122 Type 2 diabetes mellitus with diabetic chronic kidney disease: Secondary | ICD-10-CM | POA: Diagnosis not present

## 2018-01-25 DIAGNOSIS — M6281 Muscle weakness (generalized): Secondary | ICD-10-CM | POA: Diagnosis not present

## 2018-01-25 DIAGNOSIS — M1990 Unspecified osteoarthritis, unspecified site: Secondary | ICD-10-CM | POA: Diagnosis not present

## 2018-01-27 NOTE — Progress Notes (Signed)
[facility] New Visit:  Assessment: Leukopenia 77 y.o. female with numerous comorbidities referred for evaluation of bicytopenia combining mild to moderate thrombocytopenia and mild neutropenia.  The last normal neutrophil count November 2017.  Last normal platelet count the same time.  Numerous possible etiologies of this abnormality.  We will start with repeat of the labs to assess the current hematological status.  Plan: -Repeat labs today as outlined below. -Return to clinic in 2 weeks to review the findings and discuss further assessment.   Voice recognition software was used and creation of this note. Despite my best effort at editing the text, some misspelling/errors may have occurred. Orders Placed This Encounter  Procedures  . CBC with Differential (Cancer Center Only)    Standing Status:   Future    Number of Occurrences:   1    Standing Expiration Date:   01/07/2019  . CMP ([facility] only)    Standing Status:   Future    Number of Occurrences:   1    Standing Expiration Date:   01/07/2019  . Lactate dehydrogenase (LDH)    Standing Status:   Future    Number of Occurrences:   1    Standing Expiration Date:   01/07/2019  . Pathologist smear review    Standing Status:   Future    Number of Occurrences:   1    Standing Expiration Date:   01/07/2019  . Multiple Myeloma Panel (SPEP&IFE w/QIG)    Standing Status:   Future    Number of Occurrences:   1    Standing Expiration Date:   01/07/2019  . Kappa/lambda light chains    Standing Status:   Future    Number of Occurrences:   1    Standing Expiration Date:   01/07/2019    All questions were answered.  . The patient knows to call the clinic with any problems, questions or concerns.  This note was electronically signed.    History of Presenting Illness Mandy Rhodes 77 y.o. presenting to the [facility] for leukopenia and thrombocytopenia, referred by D patient has extensive past medical  history including diabetes mellitus type 2 complicated by retinopathy, nephropathy, and neuropathyr Harlan Stains.,  Chronic kidney disease stage III likely due to diabetes and hypertension, hyperlipidemia, systolic/diastolic congestive heart failure with most recent left ventricular ejection fraction of 25-30% with diffuse hypokinesis and severe concentric hypertrophy of the left ventricle,  GERD, cervical spinal stenosis, hyperparathyroidism.    At the present time, patient reports to be at the baseline of her health.  Denies fevers, chills, night sweats.  No unexpected weight loss or weight gain.  Stable dyspnea with exertion and decreased activities of daily living.  Denies nausea, vomiting, abdominal pain, diarrhea, or constipation.  No dysuria or hematuria.  Oncological/hematological History: --Labs, 08/18/15: tProt 7.4, Alb 4.1, Ca 10.0, Ca 1.7, AP 88, tBili 0.6; TSH 2.29 --Labs, 03/28/16: WBC 4.0, ANC 1.7, ALC 1.7, Mono 0.2, Hgb 11.3, Plt 182; --Labs, 10/04/16: WBC 6.6, ANC 5.2, ALC 0.6, Mono 0.7, Hgb 10.6, Plt 228; --Labs, 07/27/17: WBC 2.8, ANC 1.3, ALC 1.1, Mono 0.4, Hgb 12.0, Plt 104; tBili 1.2, tProt 7.5, Alb 4.1, AP 126, Cr 1.9, Ca 10.3; Hgb A1c 6.5% --Labs, 09/12/17: WBC 2.9, ANC 1.6, ALC 0.9, Mono 0.3, Hgb 11.7, Plt 104; --Labs, 10/25/17: WBC 2.6, ANC 1.4, ALC 0.8, Mono 0.3, Hgb 12.3, Plt   87; TSH 3.22 --Labs, 11/11/17: WBC 3.6,  Hgb 12.6, Plt   97; SPEP -- no MSpike; kappa 66.9, lambda 31.7, KLR 2.11 (up)    Medical History: Past Medical History:  Diagnosis Date  . Anemia   . Arthritis   . CAD (coronary artery disease)    a. minimal CAD by cath in 03/2016  . CHF (congestive heart failure) ([facility])    a. 09/2016: Echo with EF of 30-35%, moderate diffuse HK, Grade 2 DD, PA peak pressure of 39 mm Hg.  Marland Kitchen Chronic kidney disease    Renal Insuffiency, see [facility] once a year  . Diabetes mellitus    type 2  . GERD (gastroesophageal reflux disease)   . History  of rotator cuff tear    Torn right rotator cuff.  . History of spinal stenosis   . Hypertension   . Infiltrative cardiomyopathy ([facility])    a, presumed cardiac amyloidosis by MRI with prior nondiagnostic fat pad biopsy  . Neuropathy associated with endocrine disorder [facility])     Surgical History: Past Surgical History:  Procedure Laterality Date  . ABDOMINAL HYSTERECTOMY  1971   Partial  . ANTERIOR CERVICAL DECOMP/DISCECTOMY FUSION N/A 10/18/2016   Procedure: debridement and drainage cervical prevertebral abscess  ANTERIOR CERVICAL HARDWARE REMOVAL exploration cervical fusion;  Surgeon: Earnie Larsson, MD;  Location: [facility];  Service: Neurosurgery;  Laterality: N/A;  . APPENDECTOMY    . BACK SURGERY  2009   Disectomy  . CARDIAC CATHETERIZATION N/A 03/22/2016   Procedure: Left Heart Cath and Coronary Angiography;  Surgeon: Peter M Martinique, MD;  Location: [facility];  Service: Cardiovascular;  Laterality: N/A;  . CHOLECYSTECTOMY  1969   Status post   . CYST REMOVAL TRUNK Right 12/03/2014   Procedure: EXCISION OF RIGHT BACK CYST ;  Surgeon: Coralie Keens, MD;  Location: [facility];  Service: General;  Laterality: Right;  . DIRECT LARYNGOSCOPY N/A 10/17/2016   Procedure: DIRECT LARYNGOSCOPY,  ESOPHAGOSCOPY;  Surgeon: [name], MD;  [facility];  Service: ENT;  Laterality: N/A;  . FOOT SURGERY Bilateral    bone removed  . IR GASTROSTOMY TUBE REMOVAL  05/03/2017  . IR GENERIC HISTORICAL  10/25/2016   IR GASTROSTOMY TUBE MOD SED 10/25/2016 [name], MD [facility]  . NEUROPLASTY / TRANSPOSITION MEDIAN NERVE AT CARPAL TUNNEL Bilateral    Hx  Left carpal tunnel repair  . TRACHEOSTOMY TUBE PLACEMENT N/A 10/17/2016   Procedure: TRACHEOSTOMY;  Surgeon: [name], MD;  [facility];  Service: ENT;  Laterality: N/A;    Family History: Family History  Problem Relation Age of Onset  . Diabetes type II Mother   . Hypertension Father   . Diabetes type II Sister   . Ovarian  cancer Sister   . Diabetes type II Other   . Stroke Neg Hx   . CAD Neg Hx   . Heart failure Neg Hx     Social History: Social History   Socioeconomic History  . Marital status: Divorced    Spouse name: Not on file  . Number of children: Not on file  . Years of education: Not on file  . Highest education level: Not on file  Social Needs  . Financial resource strain: Not on file  . Food insecurity - worry: Not on file  . Food insecurity - inability: Not on file  . Transportation needs - medical: Not on file  . Transportation needs - non-medical: Not on file  Occupational History  . Occupation: retired  Tobacco Use  .  Smoking status: Former Smoker    Packs/day: 0.25    Years: 33.00    Pack years: 8.25    Types: Cigarettes  . Smokeless tobacco: Never Used  . Tobacco comment: 12/02/13- quit over 20 years ago/smoked 33 years on and off  Substance and Sexual Activity  . Alcohol use: No  . Drug use: No  . Sexual activity: Not Currently  Other Topics Concern  . Not on file  Social History Narrative  . Not on file    Allergies: Allergies  Allergen Reactions  . Sulfa Antibiotics Swelling    Medications:  Current Outpatient Medications  Medication Sig Dispense Refill  . acetaminophen (TYLENOL) 325 MG tablet Take 650 mg by mouth every 6 (six) hours as needed for mild pain.    Marland Kitchen aspirin 81 MG EC tablet Chew 81 mg by mouth daily.     . calcitRIOL (ROCALTROL) 0.25 MCG capsule Take 1 capsule (0.25 mcg total) by mouth every Monday, Wednesday, and Friday. 30 capsule 0  . Calcium Carb-Cholecalciferol (CALCIUM 600 + D PO) Take 1 tablet by mouth every morning.     . ferrous sulfate 325 (65 FE) MG EC tablet Take 325 mg by mouth daily.     . furosemide (LASIX) 80 MG tablet Take 2 tablets (160 mg total) by mouth 2 (two) times daily. 120 tablet 0  . glimepiride (AMARYL) 1 MG tablet Take 1 mg by mouth daily.    . metoprolol succinate (TOPROL XL) 25 MG 24 hr tablet Take 1 tablet (25 mg  total) by mouth daily. 30 tablet 5  . Multiple Vitamin (TAB-A-VITE) TABS Take 1 tablet by mouth every morning.     Marland Kitchen omeprazole (PRILOSEC) 40 MG capsule Take 1 capsule (40 mg total) by mouth daily.    . potassium chloride 20 MEQ TBCR Take 20 mEq by mouth daily. 30 tablet 0   No current facility-administered medications for this visit.     Review of Systems: Review of Systems  All other systems reviewed and are negative.    PHYSICAL EXAMINATION Blood pressure (!) 107/35, pulse 62, temperature (!) 97.4 F (36.3 C), temperature source Oral, resp. rate 17, height '5\' 7"'$  (1.702 m), weight 190 lb 1.6 oz (86.2 kg), SpO2 100 %.  ECOG PERFORMANCE STATUS: 2 - Symptomatic, <50% confined to bed  Physical Exam  Constitutional: She is oriented to person, place, and time and well-developed, well-nourished, and in no distress. No distress.  HENT:  Head: Normocephalic and atraumatic.  Mouth/Throat: Oropharynx is clear and moist. No oropharyngeal exudate.  Eyes: Conjunctivae and EOM are normal. Pupils are equal, round, and reactive to light. No scleral icterus.  Neck: No thyromegaly present.  Cardiovascular: Normal rate, regular rhythm and normal heart sounds.  No murmur heard. Pulmonary/Chest: Effort normal and breath sounds normal. No respiratory distress. She has no wheezes. She has no rales.  Abdominal: Soft. Bowel sounds are normal. She exhibits no distension and no mass. There is no tenderness. There is no guarding.  Musculoskeletal: She exhibits no edema.  Lymphadenopathy:    She has no cervical adenopathy.  Neurological: She is alert and oriented to person, place, and time. She has normal reflexes. No cranial nerve deficit.  Skin: Skin is warm and dry. No rash noted. She is not diaphoretic. No erythema.     LABORATORY DATA: I have personally reviewed the data as listed: Appointment on 01/07/2018  Component Date Value Ref Range Status  . Kappa free light chain 01/07/2018 85.9* 3.3 -  19.4 mg/L Final  . Lamda free light chains 01/07/2018 32.6* 5.7 - 26.3 mg/L Final  . Kappa, lamda light chain ratio 01/07/2018 2.63* 0.26 - 1.65 Final   Comment: (NOTE) Performed At: [facility] [place], [place] [id number] Rush Farmer MD [id number] Performed at [facility], [place] [address] [place], [place] [address]   . IgG (Immunoglobin G), Serum 01/07/2018 1,643* 700 - 1,600 mg/dL Final  . IgA 01/07/2018 435* 64 - 422 mg/dL Final  . IgM (Immunoglobulin M), Srm 01/07/2018 117  26 - 217 mg/dL Final  . Total Protein ELP 01/07/2018 7.3  6.0 - 8.5 g/dL Corrected  . Albumin SerPl Elph-Mcnc 01/07/2018 3.8  2.9 - 4.4 g/dL Corrected  . Alpha 1 01/07/2018 0.2  0.0 - 0.4 g/dL Corrected  . Alpha2 Glob SerPl Elph-Mcnc 01/07/2018 0.5  0.4 - 1.0 g/dL Corrected  . B-Globulin SerPl Elph-Mcnc 01/07/2018 1.0  0.7 - 1.3 g/dL Corrected  . Gamma Glob SerPl Elph-Mcnc 01/07/2018 1.7  0.4 - 1.8 g/dL Corrected  . M Protein SerPl Elph-Mcnc 01/07/2018 Not Observed  Not Observed g/dL Corrected  . Globulin, Total 01/07/2018 3.5  2.2 - 3.9 g/dL Corrected  . Albumin/Glob SerPl 01/07/2018 1.1  0.7 - 1.7 Corrected  . IFE 1 01/07/2018 Comment   Corrected   Comment: (NOTE) An apparent polyclonal gammopathy: IgG and IgA. Kappa and lambda typing appear increased.   . Please Note 01/07/2018 Comment   Corrected   Comment: (NOTE) Protein electrophoresis scan will follow via computer, mail, or courier delivery. Performed At: [facility] [place], [place] [id number] Rush Farmer MD [id number] Performed at [facility], [place] [address], [place], [place] [address]   . Path Review 01/07/2018 Reviewed By Violet Baldy, M.D.   Final   Comment: 02.19.19 ABNORMAL RBC MORPHOLOGY INCLUDING ABUNDANT TARGET CELLS. LEUKOPENIA AND THROMBOCYTOPENIA. Performed at [facility], [place] [address],  [place], [place] [address]   . LDH 01/07/2018 212  125 - 245 U/L Final   Performed at [facility], [place] [address], [place], [place] [address]  . Sodium 01/07/2018 145  136 - 145 mmol/L Final  . Potassium 01/07/2018 4.1  3.5 - 5.1 mmol/L Final  . Chloride 01/07/2018 103  98 - 109 mmol/L Final  . CO2 01/07/2018 29  22 - 29 mmol/L Final  . Glucose, Bld 01/07/2018 60* 70 - 140 mg/dL Final  . BUN 01/07/2018 71* 7 - 26 mg/dL Final  . Creatinine 01/07/2018 3.11* 0.60 - 1.10 mg/dL Final   CRITICAL RESULT CALLED TO, READ BACK BY AND VERIFIED WITH: natalie    . Calcium 01/07/2018 10.7* 8.4 - 10.4 mg/dL Final  . Total Protein 01/07/2018 7.5  6.4 - 8.3 g/dL Final  . Albumin 01/07/2018 3.7  3.5 - 5.0 g/dL Final  . AST 01/07/2018 27  5 - 34 U/L Final  . ALT 01/07/2018 21  0 - 55 U/L Final  . Alkaline Phosphatase 01/07/2018 126  40 - 150 U/L Final  . Total Bilirubin 01/07/2018 1.5* 0.2 - 1.2 mg/dL Final  . GFR, Est Non Af Am 01/07/2018 14* >60 mL/min Final  . GFR, Est AFR Am 01/07/2018 16* >60 mL/min Final   Comment: (NOTE) The eGFR has been calculated using the CKD EPI equation. This calculation has not been validated in all clinical situations. eGFR's persistently <60 mL/min signify possible Chronic Kidney Disease.   . Anion gap 01/07/2018 13* 3 -  11 Final   Performed at [facility], [place] [address] [place], [place] [address]  . WBC Count 01/07/2018 2.7* 3.9 - 10.3 K/uL Final  . RBC 01/07/2018 3.92  3.70 - 5.45 MIL/uL Final  . Hemoglobin 01/07/2018 12.0  11.6 - 15.9 g/dL Final  . HCT 01/07/2018 35.0  34.8 - 46.6 % Final  . MCV 01/07/2018 89.3  79.5 - 101.0 fL Final  . MCH 01/07/2018 30.6  25.1 - 34.0 pg Final  . MCHC 01/07/2018 34.3  31.5 - 36.0 g/dL Final  . RDW 01/07/2018 18.2* 11.2 - 14.5 % Final  . Platelet Count 01/07/2018 85* 145 - 400 K/uL Final  . Neutrophils Relative % 01/07/2018 50  % Final  . Neutro Abs 01/07/2018 1.4* 1.5 - 6.5 K/uL  Final  . Lymphocytes Relative 01/07/2018 35  % Final  . Lymphs Abs 01/07/2018 1.0  0.9 - 3.3 K/uL Final  . Monocytes Relative 01/07/2018 12  % Final  . Monocytes Absolute 01/07/2018 0.3  0.1 - 0.9 K/uL Final  . Eosinophils Relative 01/07/2018 2  % Final  . Eosinophils Absolute 01/07/2018 0.1  0.0 - 0.5 K/uL Final  . Basophils Relative 01/07/2018 1  % Final  . Basophils Absolute 01/07/2018 0.0  0.0 - 0.1 K/uL Final  . nRBC 01/07/2018 0  0 /100 WBC Final   Performed at [facility], [place] [address], [place], [place] [address]         Ardath Sax, MD

## 2018-01-27 NOTE — Assessment & Plan Note (Signed)
77 y.o. female with numerous comorbidities referred for evaluation of bicytopenia combining mild to moderate thrombocytopenia and mild neutropenia.  The last normal neutrophil count November 2017.  Last normal platelet count the same time.  Numerous possible etiologies of this abnormality.  We will start with repeat of the labs to assess the current hematological status.  Plan: -Repeat labs today as outlined below. -Return to clinic in 2 weeks to review the findings and discuss further assessment.

## 2018-01-28 DIAGNOSIS — M1990 Unspecified osteoarthritis, unspecified site: Secondary | ICD-10-CM | POA: Diagnosis not present

## 2018-01-28 DIAGNOSIS — M6281 Muscle weakness (generalized): Secondary | ICD-10-CM | POA: Diagnosis not present

## 2018-01-28 DIAGNOSIS — J449 Chronic obstructive pulmonary disease, unspecified: Secondary | ICD-10-CM | POA: Diagnosis not present

## 2018-01-28 DIAGNOSIS — E1122 Type 2 diabetes mellitus with diabetic chronic kidney disease: Secondary | ICD-10-CM | POA: Diagnosis not present

## 2018-01-28 NOTE — Progress Notes (Signed)
[name]:  Assessment: Leukopenia 77 y.o. female with numerous comorbidities referred for evaluation of bicytopenia combining mild to moderate thrombocytopenia and mild neutropenia.  The last normal neutrophil count November 2017.  Last normal platelet count the same time.  Repeat lab work confirms persistence of the laboratory abnormalities concurrent with progressive renal dysfunction and polyclonal gammopathy for both IgG and IgA without monoclonal gammopathy present.  Absence of monoclonal gammopathy makes a lymphoproliferative or a plasma cell process less likely, but is not excluded.  Potential diagnosis include malignancy, myelodysplastic syndrome, but an infectious or autoimmune process is more likely.  Plan: -Additional labs as outlined below. -Consult interventional radiology for bone marrow biopsy including bone marrow cultures -Return to clinic 2 weeks after bone marrow biopsy..   Voice recognition software was used and creation of this note. Despite my best effort at editing the text, some misspelling/errors may have occurred.  Orders Placed This Encounter  Procedures  . CT Biopsy    Standing Status:   Future    Standing Expiration Date:   01/22/2019    Order Specific Question:   Lab orders requested (DO NOT place separate lab orders, these will be automatically ordered during procedure specimen collection):    Answer:   Surgical Pathology    Comments:   Flow cytometry, morphology,  cytogenetics, MDS FISH, AFB stain, BM culture    Order Specific Question:   Reason for Exam (SYMPTOM  OR DIAGNOSIS REQUIRED)    Answer:   Bicytopenia of unknown etiology, suspect MDS, but also polyclonal gammopathy -- please eval for possible etiology    Order Specific Question:   Preferred imaging location?    Answer:   [facility]    Order Specific Question:   Radiology Contrast Protocol - do NOT remove file path    Answer:    _0 [email or web address]  . CT BONE MARROW BIOPSY & ASPIRATION    Standing Status:   Future    Standing Expiration Date:   04/25/2019    Order Specific Question:   Reason for Exam (SYMPTOM  OR DIAGNOSIS REQUIRED)    Answer:   evaluation for possible MDS    Order Specific Question:   Preferred imaging location?    Answer:   [facility]    Order Specific Question:   Radiology Contrast Protocol - do NOT remove file path    Answer:   _1 [email or web address]  . CBC with Differential ([place] Only)    Standing Status:   Future    Standing Expiration Date:   01/23/2019  . CMP ([place] only)    Standing Status:   Future    Standing Expiration Date:   01/23/2019  . Lactate dehydrogenase (LDH)    Standing Status:   Future    Standing Expiration Date:   01/22/2019  . Hepatitis B surface antibody    Standing Status:   Future    Standing Expiration Date:   01/22/2019  . Hepatitis B surface antigen    Standing Status:   Future    Standing Expiration Date:   01/22/2019  . Hepatitis B core antibody, total    Standing Status:   Future    Standing Expiration Date:   01/22/2019  . Hepatitis B E antigen    Standing Status:   Future    Standing Expiration Date:   01/22/2019  . Hepatitis C antibody (reflex if positive)    Standing Status:   Future    Standing Expiration  Date:   01/22/2019  . HIV antibody (with reflex)    Standing Status:   Future    Standing Expiration Date:   01/22/2019  . CMV antibody, IgG (EIA)    Standing Status:   Future    Standing Expiration Date:   01/22/2019  . CMV IgM    Standing Status:   Future    Standing Expiration Date:   01/22/2019  . Epstein-Barr virus VCA, IgG    Standing Status:   Future    Standing Expiration Date:   01/22/2019  . Epstein-Barr virus VCA, IgM    Standing Status:   Future    Standing Expiration Date:   01/22/2019  . Epstein-Barr virus early D antigen antibody, IgG    Standing Status:   Future     Standing Expiration Date:   01/22/2019  . Epstein-Barr virus nuclear antigen antibody, IgG    Standing Status:   Future    Standing Expiration Date:   01/22/2019    Cancer Staging No matching staging information was found for the patient.  All questions were answered. . The patient knows to call the clinic with any problems, questions or concerns.  This note was electronically signed.    History of Presenting Illness Mandy Rhodes is a 77 y.o. female followed in the [facility] for leukopenia and thrombocytopenia, referred by Dr Harlan Stains. Patient has extensive past medical history including diabetes mellitus type 2 complicated by retinopathy, nephropathy, and neuropathy,  Chronic kidney disease stage III likely due to diabetes and hypertension, hyperlipidemia, systolic/diastolic congestive heart failure with most recent left ventricular ejection fraction of 25-30% with diffuse hypokinesis and severe concentric hypertrophy of the left ventricle,  GERD, cervical spinal stenosis, hyperparathyroidism.    At the present time, patient reports to be at the baseline of her health.  Denies fevers, chills, night sweats.  No unexpected weight loss or weight gain.  Stable dyspnea with exertion and decreased activities of daily living.  Denies nausea, vomiting, abdominal pain, diarrhea, or constipation.  No dysuria or hematuria.  Oncological/hematological History: --Labs, 08/18/15: tProt 7.4, Alb 4.1, Ca 10.0, Ca 1.7, AP 88, tBili 0.6; TSH 2.29 --Labs, 03/28/16: WBC 4.0, ANC 1.7, ALC 1.7, Mono 0.2, Hgb 11.3,            Plt 182; --Labs, 10/04/16: WBC 6.6, ANC 5.2, ALC 0.6, Mono 0.7, Hgb 10.6,            Plt 228; --Labs, 07/27/17: WBC 2.8, ANC 1.3, ALC 1.1, Mono 0.4, Hgb 12.0,            Plt 104; tBili 1.2, tProt 7.5, Alb 4.1, AP 126, Cr 1.9, Ca 10.3; Hgb A1c 6.5% --Labs, 09/12/17: WBC 2.9, ANC 1.6, ALC 0.9, Mono 0.3, Hgb 11.7,            Plt 104; --Labs, 10/25/17: WBC 2.6, ANC 1.4, ALC 0.8, Mono  0.3, Hgb 12.3,            Plt   87; TSH 3.22 --Labs, 11/11/17: WBC 3.6,                                                 Hgb 12.6,            Plt   97;         SPEP -- no M-Spike;  kappa 66.9, lambda 31.7, KLR 2.11 (up) --Labs, 01/07/18: WBC 2.7, ANC 1.4, ALC 1.0, Mono 0.3, Hgb 12.0, MCV 89.3, MCH 30.6, MCHC 34.3, RDW 18.2, Plt   85; tProt 7.5, Alb 3.7, Ca ..., Cr 3.1, AP 126, tBili 1.5; SPEP -- negative, SIFE -- polyclonal gammopathy IgG & IgA; IgG 1643, IgA 435, IgM 117; kappa 85.9, lambda 32.6, KLR 2.63   No history exists.    Medical History: Past Medical History:  Diagnosis Date  . Anemia   . Arthritis   . CAD (coronary artery disease)    a. minimal CAD by cath in 03/2016  . CHF (congestive heart failure) ([place])    a. 09/2016: Echo with EF of 30-35%, moderate diffuse HK, Grade 2 DD, PA peak pressure of 39 mm Hg.  Marland Kitchen Chronic kidney disease    Renal Insuffiency, see [facility] once a year  . Diabetes mellitus    type 2  . GERD (gastroesophageal reflux disease)   . History of rotator cuff tear    Torn right rotator cuff.  . History of spinal stenosis   . Hypertension   . Infiltrative cardiomyopathy ([place])    a, presumed cardiac amyloidosis by MRI with prior nondiagnostic fat pad biopsy  . Neuropathy associated with endocrine disorder [facility])     Surgical History: Past Surgical History:  Procedure Laterality Date  . ABDOMINAL HYSTERECTOMY  1971   Partial  . ANTERIOR CERVICAL DECOMP/DISCECTOMY FUSION N/A 10/18/2016   Procedure: debridement and drainage cervical prevertebral abscess  ANTERIOR CERVICAL HARDWARE REMOVAL exploration cervical fusion;  Surgeon: Earnie Larsson, MD;  Location: [place];  Service: Neurosurgery;  Laterality: N/A;  . APPENDECTOMY    . BACK SURGERY  2009   Disectomy  . CARDIAC CATHETERIZATION N/A 03/22/2016   Procedure: Left Heart Cath and Coronary Angiography;  Surgeon: Peter M Martinique, MD;  Location: [place] [facility];  Service:  Cardiovascular;  Laterality: N/A;  . CHOLECYSTECTOMY  1969   Status post   . CYST REMOVAL TRUNK Right 12/03/2014   Procedure: EXCISION OF RIGHT BACK CYST ;  Surgeon: Coralie Keens, MD;  Location: [place];  Service: General;  Laterality: Right;  . DIRECT LARYNGOSCOPY N/A 10/17/2016   Procedure: DIRECT LARYNGOSCOPY,  ESOPHAGOSCOPY;  Surgeon: [name], MD;  Location: [facility];  Service: ENT;  Laterality: N/A;  . FOOT SURGERY Bilateral    bone removed  . IR GASTROSTOMY TUBE REMOVAL  05/03/2017  . IR GENERIC HISTORICAL  10/25/2016   IR GASTROSTOMY TUBE MOD SED 10/25/2016 [name], MD [facility]-INTERV RAD  . NEUROPLASTY / TRANSPOSITION MEDIAN NERVE AT CARPAL TUNNEL Bilateral    Hx  Left carpal tunnel repair  . TRACHEOSTOMY TUBE PLACEMENT N/A 10/17/2016   Procedure: TRACHEOSTOMY;  Surgeon: [name], MD;  Location: [facility];  Service: ENT;  Laterality: N/A;    Family History: Family History  Problem Relation Age of Onset  . Diabetes type II Mother   . Hypertension Father   . Diabetes type II Sister   . Ovarian cancer Sister   . Diabetes type II Other   . Stroke Neg Hx   . CAD Neg Hx   . Heart failure Neg Hx     Social History: Social History   Socioeconomic History  . Marital status: Divorced    Spouse name: Not on file  . Number of children: Not on file  . Years of education: Not on file  . Highest education level: Not on file  Social Needs  .  Financial resource strain: Not on file  . Food insecurity - worry: Not on file  . Food insecurity - inability: Not on file  . Transportation needs - medical: Not on file  . Transportation needs - non-medical: Not on file  Occupational History  . Occupation: retired  Tobacco Use  . Smoking status: Former Smoker    Packs/day: 0.25    Years: 33.00    Pack years: 8.25    Types: Cigarettes  . Smokeless tobacco: Never Used  . Tobacco comment: 12/02/13- quit over 20 years ago/smoked 33 years on and off  Substance and Sexual Activity   . Alcohol use: No  . Drug use: No  . Sexual activity: Not Currently  Other Topics Concern  . Not on file  Social History Narrative  . Not on file    Allergies: Allergies  Allergen Reactions  . Sulfa Antibiotics Swelling    Medications:  Current Outpatient Medications  Medication Sig Dispense Refill  . acetaminophen (TYLENOL) 325 MG tablet Take 650 mg by mouth every 6 (six) hours as needed for mild pain.    Marland Kitchen aspirin 81 MG EC tablet Chew 81 mg by mouth daily.     . calcitRIOL (ROCALTROL) 0.25 MCG capsule Take 1 capsule (0.25 mcg total) by mouth every Monday, Wednesday, and Friday. 30 capsule 0  . Calcium Carb-Cholecalciferol (CALCIUM 600 + D PO) Take 1 tablet by mouth every morning.     . ferrous sulfate 325 (65 FE) MG EC tablet Take 325 mg by mouth daily.     . furosemide (LASIX) 80 MG tablet Take 2 tablets (160 mg total) by mouth 2 (two) times daily. 120 tablet 0  . glimepiride (AMARYL) 1 MG tablet Take 1 mg by mouth daily.    . metoprolol succinate (TOPROL XL) 25 MG 24 hr tablet Take 1 tablet (25 mg total) by mouth daily. 30 tablet 5  . Multiple Vitamin (TAB-A-VITE) TABS Take 1 tablet by mouth every morning.     Marland Kitchen omeprazole (PRILOSEC) 40 MG capsule Take 1 capsule (40 mg total) by mouth daily.    . potassium chloride 20 MEQ TBCR Take 20 mEq by mouth daily. 30 tablet 0   No current facility-administered medications for this visit.     Review of Systems: Review of Systems  All other systems reviewed and are negative.    PHYSICAL EXAMINATION Blood pressure 96/62, pulse (!) 56, temperature 97.9 F (36.6 C), temperature source Oral, resp. rate (!) 24, height 5' 7" (1.702 m), SpO2 100 %.  ECOG PERFORMANCE STATUS: 2 - Symptomatic, <50% confined to bed  Physical Exam  Constitutional: She is oriented to person, place, and time and well-developed, well-nourished, and in no distress. No distress.  HENT:  Head: Normocephalic and atraumatic.  Mouth/Throat: Oropharynx is clear  and moist. No oropharyngeal exudate.  Eyes: Conjunctivae and EOM are normal. Pupils are equal, round, and reactive to light. No scleral icterus.  Neck: No thyromegaly present.  Cardiovascular: Normal rate, regular rhythm and normal heart sounds.  No murmur heard. Pulmonary/Chest: Effort normal and breath sounds normal. No respiratory distress. She has no wheezes. She has no rales.  Abdominal: Soft. Bowel sounds are normal. She exhibits no distension and no mass. There is no tenderness. There is no guarding.  Musculoskeletal: She exhibits no edema.  Lymphadenopathy:    She has no cervical adenopathy.  Neurological: She is alert and oriented to person, place, and time. She has normal reflexes. No cranial nerve deficit.  Skin:  Skin is warm and dry. No rash noted. She is not diaphoretic. No erythema.     LABORATORY DATA: I have personally reviewed the data as listed: No visits with results within 1 Week(s) from this visit.  Latest known visit with results is:  Appointment on 01/07/2018  Component Date Value Ref Range Status  . Kappa free light chain 01/07/2018 85.9* 3.3 - 19.4 mg/L Final  . Lamda free light chains 01/07/2018 32.6* 5.7 - 26.3 mg/L Final  . Kappa, lamda light chain ratio 01/07/2018 2.63* 0.26 - 1.65 Final   Comment: (NOTE) Performed At: [facility] [place], [place] [id number] Rush Farmer MD [id number] Performed at [facility], [place] [address], [place], [place] [address]   . IgG (Immunoglobin G), Serum 01/07/2018 1,643* 700 - 1,600 mg/dL Final  . IgA 01/07/2018 435* 64 - 422 mg/dL Final  . IgM (Immunoglobulin M), Srm 01/07/2018 117  26 - 217 mg/dL Final  . Total Protein ELP 01/07/2018 7.3  6.0 - 8.5 g/dL Corrected  . Albumin SerPl Elph-Mcnc 01/07/2018 3.8  2.9 - 4.4 g/dL Corrected  . Alpha 1 01/07/2018 0.2  0.0 - 0.4 g/dL Corrected  . Alpha2 Glob SerPl Elph-Mcnc 01/07/2018 0.5  0.4 - 1.0 g/dL Corrected  . B-Globulin  SerPl Elph-Mcnc 01/07/2018 1.0  0.7 - 1.3 g/dL Corrected  . Gamma Glob SerPl Elph-Mcnc 01/07/2018 1.7  0.4 - 1.8 g/dL Corrected  . M Protein SerPl Elph-Mcnc 01/07/2018 Not Observed  Not Observed g/dL Corrected  . Globulin, Total 01/07/2018 3.5  2.2 - 3.9 g/dL Corrected  . Albumin/Glob SerPl 01/07/2018 1.1  0.7 - 1.7 Corrected  . IFE 1 01/07/2018 Comment   Corrected   Comment: (NOTE) An apparent polyclonal gammopathy: IgG and IgA. Kappa and lambda typing appear increased.   . Please Note 01/07/2018 Comment   Corrected   Comment: (NOTE) Protein electrophoresis scan will follow via computer, mail, or courier delivery. Performed At: [facility] [place], [place] [id number] Rush Farmer MD [id number] Performed at [facility], [place] [address], [place], [place] [address]   . Path Review 01/07/2018 Reviewed By Violet Baldy, M.D.   Final   Comment: 02.19.19 ABNORMAL RBC MORPHOLOGY INCLUDING ABUNDANT TARGET CELLS. LEUKOPENIA AND THROMBOCYTOPENIA. Performed at [facility], [place] [address] [place], [place] [address]   . LDH 01/07/2018 212  125 - 245 U/L Final   Performed at [facility], [place] [address] [place], [place] [address]  . Sodium 01/07/2018 145  136 - 145 mmol/L Final  . Potassium 01/07/2018 4.1  3.5 - 5.1 mmol/L Final  . Chloride 01/07/2018 103  98 - 109 mmol/L Final  . CO2 01/07/2018 29  22 - 29 mmol/L Final  . Glucose, Bld 01/07/2018 60* 70 - 140 mg/dL Final  . BUN 01/07/2018 71* 7 - 26 mg/dL Final  . Creatinine 01/07/2018 3.11* 0.60 - 1.10 mg/dL Final   CRITICAL RESULT CALLED TO, READ BACK BY AND VERIFIED WITH: natalie    . Calcium 01/07/2018 10.7* 8.4 - 10.4 mg/dL Final  . Total Protein 01/07/2018 7.5  6.4 - 8.3 g/dL Final  . Albumin 01/07/2018 3.7  3.5 - 5.0 g/dL Final  . AST 01/07/2018 27  5 - 34 U/L Final  . ALT 01/07/2018 21  0 - 55 U/L Final  . Alkaline Phosphatase  01/07/2018 126  40 - 150 U/L Final  . Total Bilirubin 01/07/2018 1.5* 0.2 - 1.2  mg/dL Final  . GFR, Est Non Af Am 01/07/2018 14* >60 mL/min Final  . GFR, Est AFR Am 01/07/2018 16* >60 mL/min Final   Comment: (NOTE) The eGFR has been calculated using the CKD EPI equation. This calculation has not been validated in all clinical situations. eGFR's persistently <60 mL/min signify possible Chronic Kidney Disease.   Georgiann Hahn gap 01/07/2018 13* 3 - 11 Final   Performed at [facility], [place] [address], [place], [place] [address]  . WBC Count 01/07/2018 2.7* 3.9 - 10.3 K/uL Final  . RBC 01/07/2018 3.92  3.70 - 5.45 MIL/uL Final  . Hemoglobin 01/07/2018 12.0  11.6 - 15.9 g/dL Final  . HCT 01/07/2018 35.0  34.8 - 46.6 % Final  . MCV 01/07/2018 89.3  79.5 - 101.0 fL Final  . MCH 01/07/2018 30.6  25.1 - 34.0 pg Final  . MCHC 01/07/2018 34.3  31.5 - 36.0 g/dL Final  . RDW 01/07/2018 18.2* 11.2 - 14.5 % Final  . Platelet Count 01/07/2018 85* 145 - 400 K/uL Final  . Neutrophils Relative % 01/07/2018 50  % Final  . Neutro Abs 01/07/2018 1.4* 1.5 - 6.5 K/uL Final  . Lymphocytes Relative 01/07/2018 35  % Final  . Lymphs Abs 01/07/2018 1.0  0.9 - 3.3 K/uL Final  . Monocytes Relative 01/07/2018 12  % Final  . Monocytes Absolute 01/07/2018 0.3  0.1 - 0.9 K/uL Final  . Eosinophils Relative 01/07/2018 2  % Final  . Eosinophils Absolute 01/07/2018 0.1  0.0 - 0.5 K/uL Final  . Basophils Relative 01/07/2018 1  % Final  . Basophils Absolute 01/07/2018 0.0  0.0 - 0.1 K/uL Final  . nRBC 01/07/2018 0  0 /100 WBC Final   Performed at [facility], [place] [address], [place], [place] [address]       Ardath Sax, MD

## 2018-01-28 NOTE — Assessment & Plan Note (Signed)
77 y.o. female with numerous comorbidities referred for evaluation of bicytopenia combining mild to moderate thrombocytopenia and mild neutropenia.  The last normal neutrophil count November 2017.  Last normal platelet count the same time.  Repeat lab work confirms persistence of the laboratory abnormalities concurrent with progressive renal dysfunction and polyclonal gammopathy for both IgG and IgA without monoclonal gammopathy present.  Absence of monoclonal gammopathy makes a lymphoproliferative or a plasma cell process less likely, but is not excluded.  Potential diagnosis include malignancy, myelodysplastic syndrome, but an infectious or autoimmune process is more likely.  Plan: -Additional labs as outlined below. -Consult interventional radiology for bone marrow biopsy including bone marrow cultures -Return to clinic 2 weeks after bone marrow biopsy.Marland Kitchen

## 2018-01-29 DIAGNOSIS — E113511 Type 2 diabetes mellitus with proliferative diabetic retinopathy with macular edema, right eye: Secondary | ICD-10-CM | POA: Diagnosis not present

## 2018-01-30 DIAGNOSIS — M6281 Muscle weakness (generalized): Secondary | ICD-10-CM | POA: Diagnosis not present

## 2018-01-30 DIAGNOSIS — M1990 Unspecified osteoarthritis, unspecified site: Secondary | ICD-10-CM | POA: Diagnosis not present

## 2018-01-30 DIAGNOSIS — E1122 Type 2 diabetes mellitus with diabetic chronic kidney disease: Secondary | ICD-10-CM | POA: Diagnosis not present

## 2018-01-30 DIAGNOSIS — J449 Chronic obstructive pulmonary disease, unspecified: Secondary | ICD-10-CM | POA: Diagnosis not present

## 2018-01-31 DIAGNOSIS — E1122 Type 2 diabetes mellitus with diabetic chronic kidney disease: Secondary | ICD-10-CM | POA: Diagnosis not present

## 2018-01-31 DIAGNOSIS — M1990 Unspecified osteoarthritis, unspecified site: Secondary | ICD-10-CM | POA: Diagnosis not present

## 2018-01-31 DIAGNOSIS — J449 Chronic obstructive pulmonary disease, unspecified: Secondary | ICD-10-CM | POA: Diagnosis not present

## 2018-01-31 DIAGNOSIS — M6281 Muscle weakness (generalized): Secondary | ICD-10-CM | POA: Diagnosis not present

## 2018-02-01 DIAGNOSIS — M6281 Muscle weakness (generalized): Secondary | ICD-10-CM | POA: Diagnosis not present

## 2018-02-01 DIAGNOSIS — M1990 Unspecified osteoarthritis, unspecified site: Secondary | ICD-10-CM | POA: Diagnosis not present

## 2018-02-01 DIAGNOSIS — J449 Chronic obstructive pulmonary disease, unspecified: Secondary | ICD-10-CM | POA: Diagnosis not present

## 2018-02-01 DIAGNOSIS — E1122 Type 2 diabetes mellitus with diabetic chronic kidney disease: Secondary | ICD-10-CM | POA: Diagnosis not present

## 2018-02-03 ENCOUNTER — Other Ambulatory Visit: Payer: Self-pay | Admitting: Radiology

## 2018-02-04 ENCOUNTER — Ambulatory Visit (HOSPITAL_COMMUNITY)
Admission: RE | Admit: 2018-02-04 | Discharge: 2018-02-04 | Disposition: A | Discharge status: Home / Self Care | Payer: Medicare HMO | Source: Ambulatory Visit | Attending: Hematology and Oncology | Admitting: Hematology and Oncology

## 2018-02-04 ENCOUNTER — Encounter (HOSPITAL_COMMUNITY): Payer: Self-pay

## 2018-02-04 DIAGNOSIS — I2510 Atherosclerotic heart disease of native coronary artery without angina pectoris: Secondary | ICD-10-CM | POA: Insufficient documentation

## 2018-02-04 DIAGNOSIS — I429 Cardiomyopathy, unspecified: Secondary | ICD-10-CM | POA: Diagnosis not present

## 2018-02-04 DIAGNOSIS — M6281 Muscle weakness (generalized): Secondary | ICD-10-CM | POA: Diagnosis not present

## 2018-02-04 DIAGNOSIS — Z7982 Long term (current) use of aspirin: Secondary | ICD-10-CM | POA: Diagnosis not present

## 2018-02-04 DIAGNOSIS — C9590 Leukemia, unspecified not having achieved remission: Secondary | ICD-10-CM | POA: Diagnosis not present

## 2018-02-04 DIAGNOSIS — D696 Thrombocytopenia, unspecified: Secondary | ICD-10-CM

## 2018-02-04 DIAGNOSIS — D72819 Decreased white blood cell count, unspecified: Secondary | ICD-10-CM

## 2018-02-04 DIAGNOSIS — D61818 Other pancytopenia: Secondary | ICD-10-CM | POA: Diagnosis not present

## 2018-02-04 DIAGNOSIS — Z882 Allergy status to sulfonamides status: Secondary | ICD-10-CM | POA: Insufficient documentation

## 2018-02-04 DIAGNOSIS — I130 Hypertensive heart and chronic kidney disease with heart failure and stage 1 through stage 4 chronic kidney disease, or unspecified chronic kidney disease: Secondary | ICD-10-CM | POA: Insufficient documentation

## 2018-02-04 DIAGNOSIS — D72822 Plasmacytosis: Secondary | ICD-10-CM | POA: Diagnosis not present

## 2018-02-04 DIAGNOSIS — N189 Chronic kidney disease, unspecified: Secondary | ICD-10-CM | POA: Diagnosis not present

## 2018-02-04 DIAGNOSIS — Z7984 Long term (current) use of oral hypoglycemic drugs: Secondary | ICD-10-CM | POA: Diagnosis not present

## 2018-02-04 DIAGNOSIS — Z79899 Other long term (current) drug therapy: Secondary | ICD-10-CM | POA: Insufficient documentation

## 2018-02-04 DIAGNOSIS — K219 Gastro-esophageal reflux disease without esophagitis: Secondary | ICD-10-CM | POA: Insufficient documentation

## 2018-02-04 DIAGNOSIS — M1990 Unspecified osteoarthritis, unspecified site: Secondary | ICD-10-CM | POA: Insufficient documentation

## 2018-02-04 DIAGNOSIS — D890 Polyclonal hypergammaglobulinemia: Secondary | ICD-10-CM | POA: Diagnosis not present

## 2018-02-04 DIAGNOSIS — J449 Chronic obstructive pulmonary disease, unspecified: Secondary | ICD-10-CM | POA: Diagnosis not present

## 2018-02-04 DIAGNOSIS — I509 Heart failure, unspecified: Secondary | ICD-10-CM | POA: Diagnosis not present

## 2018-02-04 DIAGNOSIS — E1122 Type 2 diabetes mellitus with diabetic chronic kidney disease: Secondary | ICD-10-CM | POA: Diagnosis not present

## 2018-02-04 DIAGNOSIS — E1140 Type 2 diabetes mellitus with diabetic neuropathy, unspecified: Secondary | ICD-10-CM | POA: Diagnosis not present

## 2018-02-04 LAB — CBC WITH DIFFERENTIAL/PLATELET
Basophils Absolute: 0 10*3/uL (ref 0.0–0.1)
Basophils Relative: 1 %
EOS ABS: 0.1 10*3/uL (ref 0.0–0.7)
Eosinophils Relative: 2 %
HEMATOCRIT: 35.1 % — AB (ref 36.0–46.0)
Hemoglobin: 11.9 g/dL — ABNORMAL LOW (ref 12.0–15.0)
Lymphocytes Relative: 40 %
Lymphs Abs: 1.2 10*3/uL (ref 0.7–4.0)
MCH: 30.7 pg (ref 26.0–34.0)
MCHC: 33.9 g/dL (ref 30.0–36.0)
MCV: 90.5 fL (ref 78.0–100.0)
MONO ABS: 0.3 10*3/uL (ref 0.1–1.0)
MONOS PCT: 10 %
Neutro Abs: 1.4 10*3/uL — ABNORMAL LOW (ref 1.7–7.7)
Neutrophils Relative %: 47 %
Platelets: 88 10*3/uL — ABNORMAL LOW (ref 150–400)
RBC: 3.88 MIL/uL (ref 3.87–5.11)
RDW: 18.4 % — AB (ref 11.5–15.5)
WBC: 3 10*3/uL — ABNORMAL LOW (ref 4.0–10.5)

## 2018-02-04 LAB — BASIC METABOLIC PANEL
Anion gap: 11 (ref 5–15)
BUN: 77 mg/dL — ABNORMAL HIGH (ref 6–20)
CALCIUM: 10.2 mg/dL (ref 8.9–10.3)
CO2: 28 mmol/L (ref 22–32)
CREATININE: 2.97 mg/dL — AB (ref 0.44–1.00)
Chloride: 101 mmol/L (ref 101–111)
GFR calc Af Amer: 17 mL/min — ABNORMAL LOW (ref >60)
GFR calc non Af Amer: 14 mL/min — ABNORMAL LOW (ref >60)
GLUCOSE: 87 mg/dL (ref 65–99)
Potassium: 4 mmol/L (ref 3.5–5.1)
Sodium: 140 mmol/L (ref 135–145)

## 2018-02-04 LAB — PROTIME-INR
INR: 1.25
Prothrombin Time: 15.6 seconds — ABNORMAL HIGH (ref 11.4–15.2)

## 2018-02-04 LAB — GLUCOSE, CAPILLARY: Glucose-Capillary: 79 mg/dL (ref 65–99)

## 2018-02-04 MED ORDER — SODIUM CHLORIDE 0.9 % IV SOLN
INTRAVENOUS | Status: DC
  Administered 2018-02-04: 08:00:00 via INTRAVENOUS

## 2018-02-04 MED ORDER — MIDAZOLAM HCL 2 MG/2ML IJ SOLN
INTRAMUSCULAR | Status: AC
  Filled 2018-02-04: qty 4

## 2018-02-04 MED ORDER — MIDAZOLAM HCL 2 MG/2ML IJ SOLN
INTRAMUSCULAR | Status: AC | PRN
  Administered 2018-02-04 (×4): 0.5 mg via INTRAVENOUS

## 2018-02-04 MED ORDER — FENTANYL CITRATE (PF) 100 MCG/2ML IJ SOLN
INTRAMUSCULAR | Status: AC | PRN
  Administered 2018-02-04 (×2): 25 ug via INTRAVENOUS
  Administered 2018-02-04: 50 ug via INTRAVENOUS

## 2018-02-04 MED ORDER — HYDROCODONE-ACETAMINOPHEN 5-325 MG PO TABS: 1.0000 | ORAL_TABLET | ORAL | Status: DC | PRN

## 2018-02-04 MED ORDER — LIDOCAINE HCL 1 % IJ SOLN
INTRAMUSCULAR | Status: AC | PRN
  Administered 2018-02-04: 10 mL via INTRADERMAL

## 2018-02-04 MED ORDER — FENTANYL CITRATE (PF) 100 MCG/2ML IJ SOLN
INTRAMUSCULAR | Status: AC
  Filled 2018-02-04: qty 4

## 2018-02-04 NOTE — H&P (Signed)
Referring Physician(s): Perlov,Mikhail G  Supervising Physician: Markus Daft  Patient Status:  [facility]  Chief Complaint:  "I'm here for a bone marrow biopsy"  Subjective: Patient familiar to IR service from prior abdominal fat pad biopsy as well as gastrostomy tube placement in 2017 with removal in 2018.  She has a history of thrombocytopenia, neutropenia and polyclonal gammopathy of unknown etiology and presents today for CT-guided bone marrow biopsy for further evaluation.  She currently denies fever, headache, chest pain, dyspnea, cough, abdominal/back pain, nausea, vomiting or bleeding.  She does have some right shoulder discomfort secondary to a rotator cuff tear.  Past Medical History:  Diagnosis Date  . Anemia   . Arthritis   . CAD (coronary artery disease)    a. minimal CAD by cath in 03/2016  . CHF (congestive heart failure) ([place])    a. 09/2016: Echo with EF of 30-35%, moderate diffuse HK, Grade 2 DD, PA peak pressure of 39 mm Hg.  Marland Kitchen Chronic kidney disease    Renal Insuffiency, see [facility] once a year  . Diabetes mellitus    type 2  . GERD (gastroesophageal reflux disease)   . History of rotator cuff tear    Torn right rotator cuff.  . History of spinal stenosis   . Hypertension   . Infiltrative cardiomyopathy ([place])    a, presumed cardiac amyloidosis by MRI with prior nondiagnostic fat pad biopsy  . Neuropathy associated with endocrine disorder [facility])    Past Surgical History:  Procedure Laterality Date  . ABDOMINAL HYSTERECTOMY  1971   Partial  . ANTERIOR CERVICAL DECOMP/DISCECTOMY FUSION N/A 10/18/2016   Procedure: debridement and drainage cervical prevertebral abscess  ANTERIOR CERVICAL HARDWARE REMOVAL exploration cervical fusion;  Surgeon: Earnie Larsson, MD;  Location: [facility];  Service: Neurosurgery;  Laterality: N/A;  . APPENDECTOMY    . BACK SURGERY  2009   Disectomy  . CARDIAC CATHETERIZATION N/A 03/22/2016   Procedure: Left Heart Cath and Coronary  Angiography;  Surgeon: Peter M Martinique, MD;  Location: [place] [facility];  Service: Cardiovascular;  Laterality: N/A;  . CHOLECYSTECTOMY  1969   Status post   . CYST REMOVAL TRUNK Right 12/03/2014   Procedure: EXCISION OF RIGHT BACK CYST ;  Surgeon: Coralie Keens, MD;  Location: [place];  Service: General;  Laterality: Right;  . DIRECT LARYNGOSCOPY N/A 10/17/2016   Procedure: DIRECT LARYNGOSCOPY,  ESOPHAGOSCOPY;  Surgeon: [name], MD;  Location: [facility];  Service: ENT;  Laterality: N/A;  . FOOT SURGERY Bilateral    bone removed  . IR GASTROSTOMY TUBE REMOVAL  05/03/2017  . IR GENERIC HISTORICAL  10/25/2016   IR GASTROSTOMY TUBE MOD SED 10/25/2016 [name], MD [facility]-INTER[facility] RAD  . NEUROPLASTY / TRANSPOSITION MEDIAN NERVE AT CARPAL TUNNEL Bilateral    Hx  Left carpal tunnel repair  . TRACHEOSTOMY TUBE PLACEMENT N/A 10/17/2016   Procedure: TRACHEOSTOMY;  Surgeon: [name], MD;  Location: [facility];  Service: ENT;  Laterality: N/A;    Allergies: Sulfa antibiotics  Medications: Prior to Admission medications   Medication Sig Start Date End Date Taking? Authorizing Provider  acetaminophen (TYLENOL) 325 MG tablet Take 650 mg by mouth every 6 (six) hours as needed for mild pain.   Yes [provider]  aspirin 81 MG EC tablet Chew 81 mg by mouth daily.    Yes [provider]  Calcium Carb-Cholecalciferol (CALCIUM 600 + D PO) Take 1 tablet by mouth every morning.  Yes [provider]  ferrous sulfate 325 (65 FE) MG EC tablet Take 325 mg by mouth daily.    Yes [provider]  furosemide (LASIX) 80 MG tablet Take 2 tablets (160 mg total) by mouth 2 (two) times daily. 11/11/17  Yes Hongalgi, Lenis Dickinson, MD  glimepiride (AMARYL) 1 MG tablet Take 1 mg by mouth daily. 03/27/17  Yes [provider]  metoprolol succinate (TOPROL XL) 25 MG 24 hr tablet Take 1 tablet (25 mg total) by mouth daily. 12/26/17  Yes Hilty, Nadean Corwin, MD  Multiple Vitamin  (TAB-A-VITE) TABS Take 1 tablet by mouth every morning.    Yes [provider]  omeprazole (PRILOSEC) 40 MG capsule Take 1 capsule (40 mg total) by mouth daily. 11/11/17  Yes Hongalgi, Lenis Dickinson, MD  potassium chloride 20 MEQ TBCR Take 20 mEq by mouth daily. 11/11/17  Yes Modena Jansky, MD     Vital Signs: BP 103/73 (BP Location: Right Arm)   Pulse (!) 56   Temp (!) 97.5 F (36.4 C) (Oral)   Resp (!) 22   SpO2 97%   Physical Exam awake, alert.  Chest with slightly diminished breath sounds bases, heart with slight bradycardia, occasional ectopy.  Abdomen soft, positive bowel sounds, nontender.  Imaging: No results found.  Labs:  CBC: Recent Labs    11/06/17 0132 11/07/17 0438 11/08/17 0901 11/11/17 0412 01/07/18 1141  WBC 3.1* 3.2* 3.6* 3.6* 2.7*  HGB 12.4 11.5* 11.4* 12.6  --   HCT 36.0 34.1* 33.6* 36.2 35.0  PLT 80* 79* 87* 97* 85*    COAGS: No results for input(s): INR, APTT in the last 8760 hours.  BMP: Recent Labs    11/10/17 0634 11/11/17 0412 12/26/17 1040 01/07/18 1141  NA 140 139 144 145  K 3.6 4.0 3.9 4.1  CL 103 102 102 103  CO2 '27 29 26 29  ' GLUCOSE 90 89 60* 60*  BUN 37* 40* 76* 71*  CALCIUM 9.7 9.7 10.6* 10.7*  CREATININE 2.11* 2.33* 2.94* 3.11*  GFRNONAA 22* 19* 15* 14*  GFRAA 25* 22* 17* 16*    LIVER FUNCTION TESTS: Recent Labs    04/03/17 1913 05/08/17 05/15/17 11/09/17 0630 11/10/17 0634 01/07/18 1141  BILITOT 0.7  --   --   --   --  1.5*  AST '29 21 23  ' --   --  27  ALT 33 20 22  --   --  21  ALKPHOS 127* 114 130*  --   --  126  PROT 6.9  --   --   --   --  7.5  ALBUMIN 3.3*  --   --  3.1* 3.1* 3.7    Assessment and Plan: Pt with history of thrombocytopenia, neutropenia and polyclonal gammopathy of unknown etiology; presents today for CT-guided bone marrow biopsy for further evaluation.  Risks and benefits discussed with the patient/son including, but not limited to bleeding, infection, damage to adjacent structures  or low yield requiring additional tests.  All of the patient's questions were answered, patient is agreeable to proceed. Consent signed and in chart.     Electronically Signed: D. Rowe Robert, PA-C 02/04/2018, 8:20 AM   I spent a total of 25 minutes at the the patient's bedside AND on the patient's hospital floor or unit, greater than 50% of which was counseling/coordinating care for CT-guided bone marrow biopsy

## 2018-02-04 NOTE — Procedures (Signed)
CT guided bone marrow biopsy.  2 aspirates and 2 cores obtained.  Second core obtained for bone marrow cultures. Minimal blood loss and no immediate complication.

## 2018-02-04 NOTE — Discharge Instructions (Signed)
Moderate Conscious Sedation, Adult, Care After °These instructions provide you with information about caring for yourself after your procedure. Your health care provider may also give you more specific instructions. Your treatment has been planned according to current medical practices, but problems sometimes occur. Call your health care provider if you have any problems or questions after your procedure. °What can I expect after the procedure? °After your procedure, it is common: °· To feel sleepy for several hours. °· To feel clumsy and have poor balance for several hours. °· To have poor judgment for several hours. °· To vomit if you eat too soon. ° °Follow these instructions at home: °For at least 24 hours after the procedure: ° °· Do not: °? Participate in activities where you could fall or become injured. °? Drive. °? Use heavy machinery. °? Drink alcohol. °? Take sleeping pills or medicines that cause drowsiness. °? Make important decisions or sign legal documents. °? Take care of children on your own. °· Rest. °Eating and drinking °· Follow the diet recommended by your health care provider. °· If you vomit: °? Drink water, juice, or soup when you can drink without vomiting. °? Make sure you have little or no nausea before eating solid foods. °General instructions °· Have a responsible adult stay with you until you are awake and alert. °· Take over-the-counter and prescription medicines only as told by your health care provider. °· If you smoke, do not smoke without supervision. °· Keep all follow-up visits as told by your health care provider. This is important. °Contact a health care provider if: °· You keep feeling nauseous or you keep vomiting. °· You feel light-headed. °· You develop a rash. °· You have a fever. °Get help right away if: °· You have trouble breathing. °This information is not intended to replace advice given to you by your health care provider. Make sure you discuss any questions you have  with your health care provider. °Document Released: 08/27/2013 Document Revised: 04/10/2016 Document Reviewed: 02/26/2016 °Elsevier Interactive Patient Education © 2018 [employer] ° ° °Bone Marrow Aspiration and Bone Marrow Biopsy, Adult, Care After °This sheet gives you information about how to care for yourself after your procedure. Your health care provider may also give you more specific instructions. If you have problems or questions, contact your health care provider. °What can I expect after the procedure? °After the procedure, it is common to have: °· Mild pain and tenderness. °· Swelling. °· Bruising. ° °Follow these instructions at home: °· Take over-the-counter or prescription medicines only as told by your health care provider. °· Do not take baths, swim, or use a hot tub until your health care provider approves. Ask if you can take a shower or have a sponge bath.  You may shower tomorrow. °· Follow instructions from your health care provider about how to take care of the puncture site. Make sure you: °? Wash your hands with soap and water before you change your bandage (dressing). If soap and water are not available, use hand sanitizer. °? Change your dressing as told by your health care provider.  You may remove your dressing tomorrow. °· Check your puncture site every day for signs of infection. Check for: °? More redness, swelling, or pain. °? More fluid or blood. °? Warmth. °? Pus or a bad smell. °· Return to your normal activities as told by your health care provider. Ask your health care provider what activities are safe for you. °· Do not drive   for 24 hours if you were given a medicine to help you relax (sedative).  Keep all follow-up visits as told by your health care provider. This is important. Contact a health care provider if:  You have more redness, swelling, or pain around the puncture site.  You have more fluid or blood coming from the puncture site.  Your puncture site feels  warm to the touch.  You have pus or a bad smell coming from the puncture site.  You have a fever.  Your pain is not controlled with medicine. This information is not intended to replace advice given to you by your health care provider. Make sure you discuss any questions you have with your health care provider. Document Released: 05/26/2005 Document Revised: 05/26/2016 Document Reviewed: 04/19/2016 Elsevier Interactive Patient Education  2018 [employer].

## 2018-02-05 DIAGNOSIS — M6281 Muscle weakness (generalized): Secondary | ICD-10-CM | POA: Diagnosis not present

## 2018-02-05 DIAGNOSIS — I5043 Acute on chronic combined systolic (congestive) and diastolic (congestive) heart failure: Secondary | ICD-10-CM | POA: Diagnosis not present

## 2018-02-05 DIAGNOSIS — M1990 Unspecified osteoarthritis, unspecified site: Secondary | ICD-10-CM | POA: Diagnosis not present

## 2018-02-05 DIAGNOSIS — J449 Chronic obstructive pulmonary disease, unspecified: Secondary | ICD-10-CM | POA: Diagnosis not present

## 2018-02-05 DIAGNOSIS — I5042 Chronic combined systolic (congestive) and diastolic (congestive) heart failure: Secondary | ICD-10-CM | POA: Diagnosis not present

## 2018-02-05 DIAGNOSIS — R262 Difficulty in walking, not elsewhere classified: Secondary | ICD-10-CM | POA: Diagnosis not present

## 2018-02-05 DIAGNOSIS — E1122 Type 2 diabetes mellitus with diabetic chronic kidney disease: Secondary | ICD-10-CM | POA: Diagnosis not present

## 2018-02-05 LAB — ACID FAST SMEAR (AFB): ACID FAST SMEAR - AFSCU2: NEGATIVE

## 2018-02-05 LAB — ACID FAST SMEAR (AFB, MYCOBACTERIA)

## 2018-02-06 DIAGNOSIS — E1122 Type 2 diabetes mellitus with diabetic chronic kidney disease: Secondary | ICD-10-CM | POA: Diagnosis not present

## 2018-02-06 DIAGNOSIS — M6281 Muscle weakness (generalized): Secondary | ICD-10-CM | POA: Diagnosis not present

## 2018-02-06 DIAGNOSIS — M1990 Unspecified osteoarthritis, unspecified site: Secondary | ICD-10-CM | POA: Diagnosis not present

## 2018-02-06 DIAGNOSIS — J449 Chronic obstructive pulmonary disease, unspecified: Secondary | ICD-10-CM | POA: Diagnosis not present

## 2018-02-07 DIAGNOSIS — J449 Chronic obstructive pulmonary disease, unspecified: Secondary | ICD-10-CM | POA: Diagnosis not present

## 2018-02-07 DIAGNOSIS — M6281 Muscle weakness (generalized): Secondary | ICD-10-CM | POA: Diagnosis not present

## 2018-02-07 DIAGNOSIS — E1122 Type 2 diabetes mellitus with diabetic chronic kidney disease: Secondary | ICD-10-CM | POA: Diagnosis not present

## 2018-02-07 DIAGNOSIS — M1990 Unspecified osteoarthritis, unspecified site: Secondary | ICD-10-CM | POA: Diagnosis not present

## 2018-02-08 DIAGNOSIS — M1990 Unspecified osteoarthritis, unspecified site: Secondary | ICD-10-CM | POA: Diagnosis not present

## 2018-02-08 DIAGNOSIS — M6281 Muscle weakness (generalized): Secondary | ICD-10-CM | POA: Diagnosis not present

## 2018-02-08 DIAGNOSIS — E1122 Type 2 diabetes mellitus with diabetic chronic kidney disease: Secondary | ICD-10-CM | POA: Diagnosis not present

## 2018-02-08 DIAGNOSIS — J449 Chronic obstructive pulmonary disease, unspecified: Secondary | ICD-10-CM | POA: Diagnosis not present

## 2018-02-09 LAB — AEROBIC/ANAEROBIC CULTURE (SURGICAL/DEEP WOUND)

## 2018-02-09 LAB — AEROBIC/ANAEROBIC CULTURE W GRAM STAIN (SURGICAL/DEEP WOUND): Culture: NO GROWTH

## 2018-02-11 DIAGNOSIS — J449 Chronic obstructive pulmonary disease, unspecified: Secondary | ICD-10-CM | POA: Diagnosis not present

## 2018-02-11 DIAGNOSIS — M6281 Muscle weakness (generalized): Secondary | ICD-10-CM | POA: Diagnosis not present

## 2018-02-11 DIAGNOSIS — M1990 Unspecified osteoarthritis, unspecified site: Secondary | ICD-10-CM | POA: Diagnosis not present

## 2018-02-11 DIAGNOSIS — E1122 Type 2 diabetes mellitus with diabetic chronic kidney disease: Secondary | ICD-10-CM | POA: Diagnosis not present

## 2018-02-12 ENCOUNTER — Encounter: Payer: Self-pay | Admitting: Hematology and Oncology

## 2018-02-12 ENCOUNTER — Telehealth: Payer: Self-pay

## 2018-02-12 ENCOUNTER — Inpatient Hospital Stay (HOSPITAL_BASED_OUTPATIENT_CLINIC_OR_DEPARTMENT_OTHER): Payer: Medicare HMO | Admitting: Hematology and Oncology

## 2018-02-12 ENCOUNTER — Inpatient Hospital Stay: Payer: Medicare HMO

## 2018-02-12 VITALS — BP 111/65 | HR 62 | Temp 97.6°F | Resp 18 | Ht 67.0 in | Wt 183.3 lb

## 2018-02-12 DIAGNOSIS — E11319 Type 2 diabetes mellitus with unspecified diabetic retinopathy without macular edema: Secondary | ICD-10-CM

## 2018-02-12 DIAGNOSIS — E1121 Type 2 diabetes mellitus with diabetic nephropathy: Secondary | ICD-10-CM

## 2018-02-12 DIAGNOSIS — N183 Chronic kidney disease, stage 3 (moderate): Secondary | ICD-10-CM

## 2018-02-12 DIAGNOSIS — I129 Hypertensive chronic kidney disease with stage 1 through stage 4 chronic kidney disease, or unspecified chronic kidney disease: Secondary | ICD-10-CM

## 2018-02-12 DIAGNOSIS — Z8041 Family history of malignant neoplasm of ovary: Secondary | ICD-10-CM

## 2018-02-12 DIAGNOSIS — D72819 Decreased white blood cell count, unspecified: Secondary | ICD-10-CM

## 2018-02-12 DIAGNOSIS — E213 Hyperparathyroidism, unspecified: Secondary | ICD-10-CM

## 2018-02-12 DIAGNOSIS — E785 Hyperlipidemia, unspecified: Secondary | ICD-10-CM | POA: Diagnosis not present

## 2018-02-12 DIAGNOSIS — Z79899 Other long term (current) drug therapy: Secondary | ICD-10-CM

## 2018-02-12 DIAGNOSIS — D696 Thrombocytopenia, unspecified: Secondary | ICD-10-CM

## 2018-02-12 DIAGNOSIS — I429 Cardiomyopathy, unspecified: Secondary | ICD-10-CM

## 2018-02-12 DIAGNOSIS — I5040 Unspecified combined systolic (congestive) and diastolic (congestive) heart failure: Secondary | ICD-10-CM

## 2018-02-12 DIAGNOSIS — M4802 Spinal stenosis, cervical region: Secondary | ICD-10-CM

## 2018-02-12 DIAGNOSIS — K219 Gastro-esophageal reflux disease without esophagitis: Secondary | ICD-10-CM

## 2018-02-12 DIAGNOSIS — Z7982 Long term (current) use of aspirin: Secondary | ICD-10-CM

## 2018-02-12 DIAGNOSIS — Z7984 Long term (current) use of oral hypoglycemic drugs: Secondary | ICD-10-CM

## 2018-02-12 DIAGNOSIS — I2510 Atherosclerotic heart disease of native coronary artery without angina pectoris: Secondary | ICD-10-CM

## 2018-02-12 DIAGNOSIS — E1140 Type 2 diabetes mellitus with diabetic neuropathy, unspecified: Secondary | ICD-10-CM

## 2018-02-12 DIAGNOSIS — Z87891 Personal history of nicotine dependence: Secondary | ICD-10-CM

## 2018-02-12 LAB — CMP (CANCER CENTER ONLY)
ALT: 25 U/L (ref 0–55)
ANION GAP: 12 — AB (ref 3–11)
AST: 27 U/L (ref 5–34)
Albumin: 3.7 g/dL (ref 3.5–5.0)
Alkaline Phosphatase: 105 U/L (ref 40–150)
BUN: 87 mg/dL — ABNORMAL HIGH (ref 7–26)
CALCIUM: 10.5 mg/dL — AB (ref 8.4–10.4)
CO2: 24 mmol/L (ref 22–29)
Chloride: 105 mmol/L (ref 98–109)
Creatinine: 2.96 mg/dL — ABNORMAL HIGH (ref 0.60–1.10)
GFR, EST AFRICAN AMERICAN: 17 mL/min — AB (ref >60)
GFR, Estimated: 14 mL/min — ABNORMAL LOW (ref >60)
Glucose, Bld: 75 mg/dL (ref 70–140)
Potassium: 3.6 mmol/L (ref 3.5–5.1)
SODIUM: 141 mmol/L (ref 136–145)
Total Bilirubin: 1.3 mg/dL — ABNORMAL HIGH (ref 0.2–1.2)
Total Protein: 7.5 g/dL (ref 6.4–8.3)

## 2018-02-12 LAB — CBC WITH DIFFERENTIAL (CANCER CENTER ONLY)
BASOS ABS: 0 10*3/uL (ref 0.0–0.1)
BASOS PCT: 0 %
EOS PCT: 1 %
Eosinophils Absolute: 0 10*3/uL (ref 0.0–0.5)
HEMATOCRIT: 33.3 % — AB (ref 34.8–46.6)
Hemoglobin: 11.6 g/dL (ref 11.6–15.9)
Lymphocytes Relative: 31 %
Lymphs Abs: 0.9 10*3/uL (ref 0.9–3.3)
MCH: 30.9 pg (ref 25.1–34.0)
MCHC: 34.8 g/dL (ref 31.5–36.0)
MCV: 88.6 fL (ref 79.5–101.0)
MONO ABS: 0.3 10*3/uL (ref 0.1–0.9)
Monocytes Relative: 11 %
NEUTROS ABS: 1.6 10*3/uL (ref 1.5–6.5)
Neutrophils Relative %: 57 %
PLATELETS: 86 10*3/uL — AB (ref 145–400)
RBC: 3.76 MIL/uL (ref 3.70–5.45)
RDW: 17.8 % — AB (ref 11.2–14.5)
WBC: 2.8 10*3/uL — AB (ref 3.9–10.3)

## 2018-02-12 LAB — LACTATE DEHYDROGENASE: LDH: 210 U/L (ref 125–245)

## 2018-02-12 NOTE — Telephone Encounter (Signed)
Printed avs and calender of upcoming appointment. Per 3/26 los 

## 2018-02-13 DIAGNOSIS — J449 Chronic obstructive pulmonary disease, unspecified: Secondary | ICD-10-CM | POA: Diagnosis not present

## 2018-02-13 DIAGNOSIS — M1990 Unspecified osteoarthritis, unspecified site: Secondary | ICD-10-CM | POA: Diagnosis not present

## 2018-02-13 DIAGNOSIS — M6281 Muscle weakness (generalized): Secondary | ICD-10-CM | POA: Diagnosis not present

## 2018-02-13 DIAGNOSIS — E1122 Type 2 diabetes mellitus with diabetic chronic kidney disease: Secondary | ICD-10-CM | POA: Diagnosis not present

## 2018-02-13 LAB — HEPATITIS B CORE ANTIBODY, TOTAL: Hep B Core Total Ab: NEGATIVE

## 2018-02-13 LAB — CMV IGM

## 2018-02-13 LAB — CMV ANTIBODY, IGG (EIA): CMV AB - IGG: 6.2 U/mL — AB (ref 0.00–0.59)

## 2018-02-13 LAB — EPSTEIN-BARR VIRUS EARLY D ANTIGEN ANTIBODY, IGG: EBV Early Antigen Ab, IgG: <9 U/mL (ref 0.0–8.9)

## 2018-02-13 LAB — HCV COMMENT:

## 2018-02-13 LAB — HEPATITIS B SURFACE ANTIGEN: HEP B S AG: NEGATIVE

## 2018-02-13 LAB — HIV ANTIBODY (ROUTINE TESTING W REFLEX): HIV SCREEN 4TH GENERATION: NONREACTIVE

## 2018-02-13 LAB — HEPATITIS B SURFACE ANTIBODY,QUALITATIVE: Hep B S Ab: NONREACTIVE

## 2018-02-13 LAB — EPSTEIN-BARR VIRUS VCA, IGG

## 2018-02-13 LAB — HEPATITIS C ANTIBODY (REFLEX): HCV Ab: <0.1 s/co ratio (ref 0.0–0.9)

## 2018-02-13 LAB — EPSTEIN-BARR VIRUS VCA, IGM

## 2018-02-13 LAB — HEPATITIS B E ANTIGEN: Hep B E Ag: NEGATIVE

## 2018-02-13 LAB — EPSTEIN-BARR VIRUS NUCLEAR ANTIGEN ANTIBODY, IGG: EBV NA IgG: >600 U/mL — ABNORMAL HIGH (ref 0.0–17.9)

## 2018-02-14 ENCOUNTER — Encounter (HOSPITAL_COMMUNITY): Payer: Self-pay | Admitting: Hematology and Oncology

## 2018-02-14 DIAGNOSIS — G8191 Hemiplegia, unspecified affecting right dominant side: Secondary | ICD-10-CM | POA: Diagnosis not present

## 2018-02-14 DIAGNOSIS — M75101 Unspecified rotator cuff tear or rupture of right shoulder, not specified as traumatic: Secondary | ICD-10-CM | POA: Diagnosis not present

## 2018-02-14 DIAGNOSIS — M4802 Spinal stenosis, cervical region: Secondary | ICD-10-CM | POA: Diagnosis not present

## 2018-02-15 DIAGNOSIS — M1990 Unspecified osteoarthritis, unspecified site: Secondary | ICD-10-CM | POA: Diagnosis not present

## 2018-02-15 DIAGNOSIS — J449 Chronic obstructive pulmonary disease, unspecified: Secondary | ICD-10-CM | POA: Diagnosis not present

## 2018-02-15 DIAGNOSIS — E1122 Type 2 diabetes mellitus with diabetic chronic kidney disease: Secondary | ICD-10-CM | POA: Diagnosis not present

## 2018-02-15 DIAGNOSIS — M6281 Muscle weakness (generalized): Secondary | ICD-10-CM | POA: Diagnosis not present

## 2018-02-15 LAB — CHROMOSOME ANALYSIS, BONE MARROW

## 2018-02-15 LAB — TISSUE HYBRIDIZATION (BONE MARROW)-NCBH

## 2018-02-17 NOTE — Assessment & Plan Note (Signed)
77 y.o. female with numerous comorbidities referred for evaluation of bicytopenia combining mild to moderate thrombocytopenia and mild neutropenia.  The last normal neutrophil count November 2017.  Last normal platelet count the same time.  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  Absence of monoclonal gammopathy makes a lymphoproliferative or a plasma cell process less likely, but is not excluded.  Bone marrow findings are consistent with presence of polyclonal plasma cells explaining the polyclonal gammopathy in the peripheral blood.  The bone marrow is somewhat hypercellular with hypogranular granulocytic precursors suggesting possible presence of myelodysplastic syndrome.  At this time, patient does not require transfusions.  The cytogenetics and molecular studies are negative for any definitive mutations to confirm the diagnosis.  Plan: -Initiate observation -Return to clinic in 4 months for continued hematological monitoring.

## 2018-02-17 NOTE — Progress Notes (Signed)
[facility] Follow-up Visit:  Assessment: Leukopenia 77 y.o. female with numerous comorbidities referred for evaluation of bicytopenia combining mild to moderate thrombocytopenia and mild neutropenia.  The last normal neutrophil count November 2017.  Last normal platelet count the same time.  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  Absence of monoclonal gammopathy makes a lymphoproliferative or a plasma cell process less likely, but is not excluded.  Bone marrow findings are consistent with presence of polyclonal plasma cells explaining the polyclonal gammopathy in the peripheral blood.  The bone marrow is somewhat hypercellular with hypogranular granulocytic precursors suggesting possible presence of myelodysplastic syndrome.  At this time, patient does not require transfusions.  The cytogenetics and molecular studies are negative for any definitive mutations to confirm the diagnosis.  Plan: -Initiate observation -Return to clinic in 4 months for continued hematological monitoring.   Voice recognition software was used and creation of this note. Despite my best effort at editing the text, some misspelling/errors may have occurred.  Orders Placed This Encounter  Procedures  . CBC with Differential (Cancer Center Only)    Standing Status:   Future    Standing Expiration Date:   02/13/2019  . CMP ([facility] only)    Standing Status:   Future    Standing Expiration Date:   02/13/2019  . Copper, serum    Standing Status:   Future    Standing Expiration Date:   02/13/2019  . Sedimentation rate    Standing Status:   Future    Standing Expiration Date:   02/13/2019  . C-reactive protein    Standing Status:   Future    Standing Expiration Date:   02/13/2019  . Lactate dehydrogenase (LDH)    Standing Status:    Future    Standing Expiration Date:   02/12/2019    Cancer Staging No matching staging information was found for the patient.  All questions were answered. . The patient knows to call the clinic with any problems, questions or concerns.  This note was electronically signed.    History of Presenting Illness Mandy Rhodes is a 77 y.o. female followed in the [facility] for leukopenia and thrombocytopenia, referred by Dr Harlan Stains. Patient has extensive past medical history including diabetes mellitus type 2 complicated by retinopathy, nephropathy, and neuropathy,  Chronic kidney disease stage III likely due to diabetes and hypertension, hyperlipidemia, systolic/diastolic congestive heart failure with most recent left ventricular ejection fraction of 25-30% with diffuse hypokinesis and severe concentric hypertrophy of the left ventricle,  GERD, cervical spinal stenosis, hyperparathyroidism.    At the present time, patient reports to be at the baseline of her health.  Denies fevers, chills, night sweats.  No unexpected weight loss or weight gain.  Stable dyspnea with exertion and decreased activities of daily living.  Denies nausea, vomiting, abdominal pain, diarrhea, or constipation.  No dysuria or hematuria.  Oncological/hematological History: --Labs, 08/18/15: tProt 7.4, Alb 4.1, Ca 10.0, Ca 1.7, AP 88, tBili 0.6; TSH 2.29 --Labs, 03/28/16: WBC 4.0, ANC 1.7, ALC 1.7, Mono 0.2, Hgb 11.3,            Plt 182; --Labs, 10/04/16: WBC 6.6, ANC 5.2, ALC 0.6, Mono 0.7, Hgb 10.6,            Plt 228; --Labs, 07/27/17: WBC 2.8, ANC 1.3, ALC 1.1, Mono 0.4, Hgb 12.0,  Plt 104; tBili 1.2, tProt 7.5, Alb 4.1, AP 126, Cr 1.9, Ca 10.3; Hgb A1c 6.5% --Labs, 09/12/17: WBC 2.9, ANC 1.6, ALC 0.9, Mono 0.3, Hgb 11.7,            Plt 104; --Labs, 10/25/17: WBC 2.6, ANC 1.4, ALC 0.8, Mono 0.3, Hgb 12.3,            Plt   87; TSH 3.22 --Labs, 11/11/17: WBC 3.6,                                                  Hgb 12.6,            Plt   97;         SPEP -- no M-Spike;                      kappa 66.9, lambda 31.7, KLR 2.11 (up) --Labs, 01/07/18: WBC 2.7, ANC 1.4, ALC 1.0, Mono 0.3, Hgb 12.0, MCV 89.3, MCH 30.6, MCHC 34.3, RDW 18.2, Plt   85; tProt 7.5, Alb 3.7, Ca ..., Cr 3.1, AP 126, tBili 1.5; SPEP -- negative, SIFE -- polyclonal gammopathy IgG & IgA; IgG 1643, IgA 435, IgM 117; kappa 85.9, lambda 32.6, KLR 2.63 --BM Bx, 02/04/18: 40% cellularity with 5% polyclonal plasma cell involvement, hypogranular granulocytes precursors and normal megakaryocytes by number and appearance. CytoGen -- 46,XX normal cytogenetics; MDS FISH -- negative;    No history exists.    Medical History: Past Medical History:  Diagnosis Date  . Anemia   . Arthritis   . CAD (coronary artery disease)    a. minimal CAD by cath in 03/2016  . CHF (congestive heart failure) ([facility])    a. 09/2016: Echo with EF of 30-35%, moderate diffuse HK, Grade 2 DD, PA peak pressure of 39 mm Hg.  Marland Kitchen Chronic kidney disease    Renal Insuffiency, see [facility] once a year  . Diabetes mellitus    type 2  . GERD (gastroesophageal reflux disease)   . History of rotator cuff tear    Torn right rotator cuff.  . History of spinal stenosis   . Hypertension   . Infiltrative cardiomyopathy ([facility])    a, presumed cardiac amyloidosis by MRI with prior nondiagnostic fat pad biopsy  . Neuropathy associated with endocrine disorder [facility] ([facility]     Surgical History: Past Surgical History:  Procedure Laterality Date  . ABDOMINAL HYSTERECTOMY  1971   Partial  . ANTERIOR CERVICAL DECOMP/DISCECTOMY FUSION N/A 10/18/2016   Procedure: debridement and drainage cervical prevertebral abscess  ANTERIOR CERVICAL HARDWARE REMOVAL exploration cervical fusion;  Surgeon: Earnie Larsson, MD;  Location: [facility];  Service: Neurosurgery;  Laterality: N/A;  . APPENDECTOMY    . BACK SURGERY  2009   Disectomy  . CARDIAC CATHETERIZATION N/A 03/22/2016   Procedure: Left  Heart Cath and Coronary Angiography;  Surgeon: Peter M Martinique, MD;  Location: [facility];  Service: Cardiovascular;  Laterality: N/A;  . CHOLECYSTECTOMY  1969   Status post   . CYST REMOVAL TRUNK Right 12/03/2014   Procedure: EXCISION OF RIGHT BACK CYST ;  Surgeon: Coralie Keens, MD;  Location: [facility];  Service: General;  Laterality: Right;  . DIRECT LARYNGOSCOPY N/A 10/17/2016   Procedure: DIRECT LARYNGOSCOPY,  ESOPHAGOSCOPY;  Surgeon: [name], MD;  Location: [facility];  Service:  ENT;  Laterality: N/A;  . FOOT SURGERY Bilateral    bone removed  . IR GASTROSTOMY TUBE REMOVAL  05/03/2017  . IR GENERIC HISTORICAL  10/25/2016   IR GASTROSTOMY TUBE MOD SED 10/25/2016 [name], MD [facility]  . NEUROPLASTY / TRANSPOSITION MEDIAN NERVE AT CARPAL TUNNEL Bilateral    Hx  Left carpal tunnel repair  . TRACHEOSTOMY TUBE PLACEMENT N/A 10/17/2016   Procedure: TRACHEOSTOMY;  Surgeon: [name], MD;  Location[facility];  Service: ENT;  Laterality: N/A;    Family History: Family History  Problem Relation Age of Onset  . Diabetes type II Mother   . Hypertension Father   . Diabetes type II Sister   . Ovarian cancer Sister   . Diabetes type II Other   . Stroke Neg Hx   . CAD Neg Hx   . Heart failure Neg Hx     Social History: Social History   Socioeconomic History  . Marital status: Divorced    Spouse name: Not on file  . Number of children: Not on file  . Years of education: Not on file  . Highest education level: Not on file  Occupational History  . Occupation: retired  Scientific laboratory technician  . Financial resource strain: Not on file  . Food insecurity:    Worry: Not on file    Inability: Not on file  . Transportation needs:    Medical: Not on file    Non-medical: Not on file  Tobacco Use  . Smoking status: Former Smoker    Packs/day: 0.25    Years: 33.00    Pack years: 8.25    Types: Cigarettes  . Smokeless tobacco: Never Used  . Tobacco comment: 12/02/13- quit  over 20 years ago/smoked 33 years on and off  Substance and Sexual Activity  . Alcohol use: No  . Drug use: No  . Sexual activity: Not Currently  Lifestyle  . Physical activity:    Days per week: Not on file    Minutes per session: Not on file  . Stress: Not on file  Relationships  . Social connections:    Talks on phone: Not on file    Gets together: Not on file    Attends religious service: Not on file    Active member of club or organization: Not on file    Attends meetings of clubs or organizations: Not on file    Relationship status: Not on file  . Intimate partner violence:    Fear of current or ex partner: Not on file    Emotionally abused: Not on file    Physically abused: Not on file    Forced sexual activity: Not on file  Other Topics Concern  . Not on file  Social History Narrative  . Not on file    Allergies: Allergies  Allergen Reactions  . Sulfa Antibiotics Swelling    Medications:  Current Outpatient Medications  Medication Sig Dispense Refill  . acetaminophen (TYLENOL) 325 MG tablet Take 650 mg by mouth every 6 (six) hours as needed for mild pain.    Marland Kitchen aspirin 81 MG EC tablet Chew 81 mg by mouth daily.     . Calcium Carb-Cholecalciferol (CALCIUM 600 + D PO) Take 1 tablet by mouth every morning.     . ferrous sulfate 325 (65 FE) MG EC tablet Take 325 mg by mouth daily.     . furosemide (LASIX) 80 MG tablet Take 2 tablets (160 mg total) by mouth 2 (  two) times daily. 120 tablet 0  . glimepiride (AMARYL) 1 MG tablet Take 1 mg by mouth daily.    . metoprolol succinate (TOPROL XL) 25 MG 24 hr tablet Take 1 tablet (25 mg total) by mouth daily. 30 tablet 5  . Multiple Vitamin (TAB-A-VITE) TABS Take 1 tablet by mouth every morning.     Marland Kitchen omeprazole (PRILOSEC) 40 MG capsule Take 1 capsule (40 mg total) by mouth daily.    . potassium chloride 20 MEQ TBCR Take 20 mEq by mouth daily. 30 tablet 0   No current facility-administered medications for this visit.      Review of Systems: Review of Systems  All other systems reviewed and are negative.    PHYSICAL EXAMINATION Blood pressure 111/65, pulse 62, temperature 97.6 F (36.4 C), temperature source Oral, resp. rate 18, height '5\' 7"'  (1.702 m), weight 183 lb 4.8 oz (83.1 kg), SpO2 97 %.  ECOG PERFORMANCE STATUS: 2 - Symptomatic, <50% confined to bed  Physical Exam  Constitutional: She is oriented to person, place, and time and well-developed, well-nourished, and in no distress. No distress.  HENT:  Head: Normocephalic and atraumatic.  Mouth/Throat: Oropharynx is clear and moist. No oropharyngeal exudate.  Eyes: Pupils are equal, round, and reactive to light. Conjunctivae and EOM are normal. No scleral icterus.  Neck: No thyromegaly present.  Cardiovascular: Normal rate, regular rhythm and normal heart sounds.  No murmur heard. Pulmonary/Chest: Effort normal and breath sounds normal. No respiratory distress. She has no wheezes. She has no rales.  Abdominal: Soft. Bowel sounds are normal. She exhibits no distension and no mass. There is no tenderness. There is no guarding.  Musculoskeletal: She exhibits no edema.  Lymphadenopathy:    She has no cervical adenopathy.  Neurological: She is alert and oriented to person, place, and time. She has normal reflexes. No cranial nerve deficit.  Skin: Skin is warm and dry. No rash noted. She is not diaphoretic. No erythema.     LABORATORY DATA: I have personally reviewed the data as listed: Clinical Support on 02/12/2018  Component Date Value Ref Range Status  . WBC Count 02/12/2018 2.8* 3.9 - 10.3 K/uL Final  . RBC 02/12/2018 3.76  3.70 - 5.45 MIL/uL Final  . Hemoglobin 02/12/2018 11.6  11.6 - 15.9 g/dL Final  . HCT 02/12/2018 33.3* 34.8 - 46.6 % Final  . MCV 02/12/2018 88.6  79.5 - 101.0 fL Final  . MCH 02/12/2018 30.9  25.1 - 34.0 pg Final  . MCHC 02/12/2018 34.8  31.5 - 36.0 g/dL Final  . RDW 02/12/2018 17.8* 11.2 - 14.5 % Final  .  Platelet Count 02/12/2018 86* 145 - 400 K/uL Final  . Neutrophils Relative % 02/12/2018 57  % Final  . Neutro Abs 02/12/2018 1.6  1.5 - 6.5 K/uL Final  . Lymphocytes Relative 02/12/2018 31  % Final  . Lymphs Abs 02/12/2018 0.9  0.9 - 3.3 K/uL Final  . Monocytes Relative 02/12/2018 11  % Final  . Monocytes Absolute 02/12/2018 0.3  0.1 - 0.9 K/uL Final  . Eosinophils Relative 02/12/2018 1  % Final  . Eosinophils Absolute 02/12/2018 0.0  0.0 - 0.5 K/uL Final  . Basophils Relative 02/12/2018 0  % Final  . Basophils Absolute 02/12/2018 0.0  0.0 - 0.1 K/uL Final   Performed at [facility], [place] [address] [place], [place] [address]  . Sodium 02/12/2018 141  136 - 145 mmol/L Final  . Potassium 02/12/2018 3.6  3.5 -  5.1 mmol/L Final  . Chloride 02/12/2018 105  98 - 109 mmol/L Final  . CO2 02/12/2018 24  22 - 29 mmol/L Final  . Glucose, Bld 02/12/2018 75  70 - 140 mg/dL Final  . BUN 02/12/2018 87* 7 - 26 mg/dL Final  . Creatinine 02/12/2018 2.96* 0.60 - 1.10 mg/dL Final  . Calcium 02/12/2018 10.5* 8.4 - 10.4 mg/dL Final  . Total Protein 02/12/2018 7.5  6.4 - 8.3 g/dL Final  . Albumin 02/12/2018 3.7  3.5 - 5.0 g/dL Final  . AST 02/12/2018 27  5 - 34 U/L Final  . ALT 02/12/2018 25  0 - 55 U/L Final  . Alkaline Phosphatase 02/12/2018 105  40 - 150 U/L Final  . Total Bilirubin 02/12/2018 1.3* 0.2 - 1.2 mg/dL Final  . GFR, Est Non Af Am 02/12/2018 14* >60 mL/min Final  . GFR, Est AFR Am 02/12/2018 17* >60 mL/min Final   Comment: (NOTE) The eGFR has been calculated using the CKD EPI equation. This calculation has not been validated in all clinical situations. eGFR's persistently <60 mL/min signify possible Chronic Kidney Disease.   Georgiann Hahn gap 02/12/2018 12* 3 - 11 Final   Performed at [facility], [place] [address] [place], [place] [address]  . LDH 02/12/2018 210  125 - 245 U/L Final   Performed at [facility], [place]  [address] [place], [place] [address]  . Hep B S Ab 02/12/2018 Non Reactive   Final   Comment: (NOTE)              Non Reactive: Inconsistent with immunity,                            less than 10 mIU/mL              Reactive:     Consistent with immunity,                            greater than 9.9 mIU/mL Performed At: [facility] [place], [place] [id number] Rush Farmer MD [id number] Performed at [facility], [place] [address], [place], [place] [address]   . Hepatitis B Surface Ag 02/12/2018 Negative  Negative Final   Comment: (NOTE) Performed At: [facility] [place], [place] [id number] Rush Farmer MD [id number] Performed at [facility], [place] [address] [place], [place] [address]   . Hep B Core Total Ab 02/12/2018 Negative  Negative Final   Comment: (NOTE) Performed At: [facility] [place], [place] [id number] Rush Farmer MD [id number] Performed at [facility], [place] [address], [place], [place] [address]   . Hep B E Ag 02/12/2018 Negative  Negative Final   Comment: (NOTE) Performed At: [facility] [place], [place] [id number] Rush Farmer MD [id number] Performed at [facility], [place] [address], [place], [place] [address]   . HCV Ab 02/12/2018 <0.1  0.0 - 0.9 s/co ratio Final   Comment: (NOTE) Performed At: [facility] [place], [place] [id number] Rush Farmer MD [id number] Performed at [facility], [place] [address] [place], [place] [address]   . HIV Screen 4th Generation wRfx 02/12/2018 Non Reactive  Non Reactive Final   Comment: (NOTE) Performed At: [facility] [address]  [place] [id number] Rush Farmer MD [id number] Performed at [facility], [place]  [address] [place], [place] [address]   . CMV Ab - IgG 02/12/2018 6.20* 0.00 - 0.59 U/mL Final   Comment: (NOTE)                               Negative          <0.60                               Equivocal   0.60 - 0.69                               Positive          >0.69 Performed At: [facility] [place], [place] [id number] Rush Farmer MD [id number] Performed at [facility], [place] [address], [place], [place] [address]   . CMV IgM 02/12/2018 <30.0  0.0 - 29.9 AU/mL Final   Comment: (NOTE)                                Negative         <30.0                                Equivocal  30.0 - 34.9                                Positive         >34.9 A positive result is generally indicative of acute infection, reactivation or persistent IgM production. Performed At: [facility] [place], [place] [id number] Rush Farmer MD [id number] Performed at [facility], [place] [address], [place], [place] [address]   . EBV VCA IgG 02/12/2018 >600.0* 0.0 - 17.9 U/mL Final   Comment: (NOTE)                                 Negative        <18.0                                 Equivocal 18.0 - 21.9                                 Positive        >21.9 Performed At: [facility] [place], [place] [id number] Rush Farmer MD [id number] Performed at [facility], [place] [address], [place], [place] [address]   . EBV VCA IgM 02/12/2018 <36.0  0.0 - 35.9 U/mL Final   Comment: (NOTE)                                 Negative        <36.0  Equivocal 36.0 - 43.9                                 Positive        >43.9 Performed At: [facility] [place], [place] [id number] Rush Farmer MD [id number] Performed at [facility], [place] [address], [place], [place]  [address]   . EBV Early Antigen Ab, IgG 02/12/2018 <9.0  0.0 - 8.9 U/mL Final   Comment: (NOTE)                                 Negative        < 9.0                                 Equivocal  9.0 - 10.9                                 Positive        >10.9 Performed At: [facility] [place], [place] [id number] Rush Farmer MD [id number] Performed at [facility], [place] [address] [place], [place] [address]   . EBV NA IgG 02/12/2018 >600.0* 0.0 - 17.9 U/mL Final   Comment: (NOTE)                                 Negative        <18.0                                 Equivocal 18.0 - 21.9                                 Positive        >21.9 Performed At: [facility] [place], [place] [id number] Rush Farmer MD [id number] Performed at [facility], [place] [address], [place], [place] [address]   . Comment: 02/12/2018 Comment   Final   Comment: (NOTE) Non reactive HCV antibody screen is consistent with no HCV infection, unless recent infection is suspected or other evidence exists to indicate HCV infection. Performed At: [facility] [place], [place] [id number] Rush Farmer MD [id number] Performed at [facility], [place] [address] [place], [place] [address]        Ardath Sax, MD

## 2018-02-18 DIAGNOSIS — J449 Chronic obstructive pulmonary disease, unspecified: Secondary | ICD-10-CM | POA: Diagnosis not present

## 2018-02-18 DIAGNOSIS — E1122 Type 2 diabetes mellitus with diabetic chronic kidney disease: Secondary | ICD-10-CM | POA: Diagnosis not present

## 2018-02-18 DIAGNOSIS — M6281 Muscle weakness (generalized): Secondary | ICD-10-CM | POA: Diagnosis not present

## 2018-02-18 DIAGNOSIS — M1990 Unspecified osteoarthritis, unspecified site: Secondary | ICD-10-CM | POA: Diagnosis not present

## 2018-02-19 DIAGNOSIS — J449 Chronic obstructive pulmonary disease, unspecified: Secondary | ICD-10-CM | POA: Diagnosis not present

## 2018-02-19 DIAGNOSIS — E1122 Type 2 diabetes mellitus with diabetic chronic kidney disease: Secondary | ICD-10-CM | POA: Diagnosis not present

## 2018-02-19 DIAGNOSIS — M1990 Unspecified osteoarthritis, unspecified site: Secondary | ICD-10-CM | POA: Diagnosis not present

## 2018-02-19 DIAGNOSIS — M6281 Muscle weakness (generalized): Secondary | ICD-10-CM | POA: Diagnosis not present

## 2018-02-20 DIAGNOSIS — J449 Chronic obstructive pulmonary disease, unspecified: Secondary | ICD-10-CM | POA: Diagnosis not present

## 2018-02-20 DIAGNOSIS — M6281 Muscle weakness (generalized): Secondary | ICD-10-CM | POA: Diagnosis not present

## 2018-02-20 DIAGNOSIS — M1990 Unspecified osteoarthritis, unspecified site: Secondary | ICD-10-CM | POA: Diagnosis not present

## 2018-02-20 DIAGNOSIS — E1122 Type 2 diabetes mellitus with diabetic chronic kidney disease: Secondary | ICD-10-CM | POA: Diagnosis not present

## 2018-02-21 DIAGNOSIS — J449 Chronic obstructive pulmonary disease, unspecified: Secondary | ICD-10-CM | POA: Diagnosis not present

## 2018-02-21 DIAGNOSIS — M6281 Muscle weakness (generalized): Secondary | ICD-10-CM | POA: Diagnosis not present

## 2018-02-21 DIAGNOSIS — M1990 Unspecified osteoarthritis, unspecified site: Secondary | ICD-10-CM | POA: Diagnosis not present

## 2018-02-21 DIAGNOSIS — E1122 Type 2 diabetes mellitus with diabetic chronic kidney disease: Secondary | ICD-10-CM | POA: Diagnosis not present

## 2018-02-22 DIAGNOSIS — J449 Chronic obstructive pulmonary disease, unspecified: Secondary | ICD-10-CM | POA: Diagnosis not present

## 2018-02-22 DIAGNOSIS — M6281 Muscle weakness (generalized): Secondary | ICD-10-CM | POA: Diagnosis not present

## 2018-02-22 DIAGNOSIS — E1122 Type 2 diabetes mellitus with diabetic chronic kidney disease: Secondary | ICD-10-CM | POA: Diagnosis not present

## 2018-02-22 DIAGNOSIS — M1990 Unspecified osteoarthritis, unspecified site: Secondary | ICD-10-CM | POA: Diagnosis not present

## 2018-02-25 DIAGNOSIS — M1990 Unspecified osteoarthritis, unspecified site: Secondary | ICD-10-CM | POA: Diagnosis not present

## 2018-02-25 DIAGNOSIS — E1122 Type 2 diabetes mellitus with diabetic chronic kidney disease: Secondary | ICD-10-CM | POA: Diagnosis not present

## 2018-02-25 DIAGNOSIS — M6281 Muscle weakness (generalized): Secondary | ICD-10-CM | POA: Diagnosis not present

## 2018-02-25 DIAGNOSIS — J449 Chronic obstructive pulmonary disease, unspecified: Secondary | ICD-10-CM | POA: Diagnosis not present

## 2018-02-25 LAB — CULTURE, FUNGUS WITHOUT SMEAR

## 2018-02-26 DIAGNOSIS — H3582 Retinal ischemia: Secondary | ICD-10-CM | POA: Diagnosis not present

## 2018-02-26 DIAGNOSIS — E113592 Type 2 diabetes mellitus with proliferative diabetic retinopathy without macular edema, left eye: Secondary | ICD-10-CM | POA: Diagnosis not present

## 2018-02-26 DIAGNOSIS — E113511 Type 2 diabetes mellitus with proliferative diabetic retinopathy with macular edema, right eye: Secondary | ICD-10-CM | POA: Diagnosis not present

## 2018-02-26 DIAGNOSIS — H43813 Vitreous degeneration, bilateral: Secondary | ICD-10-CM | POA: Diagnosis not present

## 2018-02-27 DIAGNOSIS — E1122 Type 2 diabetes mellitus with diabetic chronic kidney disease: Secondary | ICD-10-CM | POA: Diagnosis not present

## 2018-02-27 DIAGNOSIS — M1990 Unspecified osteoarthritis, unspecified site: Secondary | ICD-10-CM | POA: Diagnosis not present

## 2018-02-27 DIAGNOSIS — M6281 Muscle weakness (generalized): Secondary | ICD-10-CM | POA: Diagnosis not present

## 2018-02-27 DIAGNOSIS — J449 Chronic obstructive pulmonary disease, unspecified: Secondary | ICD-10-CM | POA: Diagnosis not present

## 2018-02-28 DIAGNOSIS — M1990 Unspecified osteoarthritis, unspecified site: Secondary | ICD-10-CM | POA: Diagnosis not present

## 2018-02-28 DIAGNOSIS — E1122 Type 2 diabetes mellitus with diabetic chronic kidney disease: Secondary | ICD-10-CM | POA: Diagnosis not present

## 2018-02-28 DIAGNOSIS — M6281 Muscle weakness (generalized): Secondary | ICD-10-CM | POA: Diagnosis not present

## 2018-02-28 DIAGNOSIS — J449 Chronic obstructive pulmonary disease, unspecified: Secondary | ICD-10-CM | POA: Diagnosis not present

## 2018-03-01 DIAGNOSIS — E1122 Type 2 diabetes mellitus with diabetic chronic kidney disease: Secondary | ICD-10-CM | POA: Diagnosis not present

## 2018-03-01 DIAGNOSIS — M1990 Unspecified osteoarthritis, unspecified site: Secondary | ICD-10-CM | POA: Diagnosis not present

## 2018-03-01 DIAGNOSIS — M6281 Muscle weakness (generalized): Secondary | ICD-10-CM | POA: Diagnosis not present

## 2018-03-01 DIAGNOSIS — J449 Chronic obstructive pulmonary disease, unspecified: Secondary | ICD-10-CM | POA: Diagnosis not present

## 2018-03-04 DIAGNOSIS — M1990 Unspecified osteoarthritis, unspecified site: Secondary | ICD-10-CM | POA: Diagnosis not present

## 2018-03-04 DIAGNOSIS — J449 Chronic obstructive pulmonary disease, unspecified: Secondary | ICD-10-CM | POA: Diagnosis not present

## 2018-03-04 DIAGNOSIS — E1122 Type 2 diabetes mellitus with diabetic chronic kidney disease: Secondary | ICD-10-CM | POA: Diagnosis not present

## 2018-03-04 DIAGNOSIS — M6281 Muscle weakness (generalized): Secondary | ICD-10-CM | POA: Diagnosis not present

## 2018-03-06 DIAGNOSIS — M1990 Unspecified osteoarthritis, unspecified site: Secondary | ICD-10-CM | POA: Diagnosis not present

## 2018-03-06 DIAGNOSIS — M6281 Muscle weakness (generalized): Secondary | ICD-10-CM | POA: Diagnosis not present

## 2018-03-06 DIAGNOSIS — J449 Chronic obstructive pulmonary disease, unspecified: Secondary | ICD-10-CM | POA: Diagnosis not present

## 2018-03-06 DIAGNOSIS — E1122 Type 2 diabetes mellitus with diabetic chronic kidney disease: Secondary | ICD-10-CM | POA: Diagnosis not present

## 2018-03-07 DIAGNOSIS — J449 Chronic obstructive pulmonary disease, unspecified: Secondary | ICD-10-CM | POA: Diagnosis not present

## 2018-03-07 DIAGNOSIS — E1122 Type 2 diabetes mellitus with diabetic chronic kidney disease: Secondary | ICD-10-CM | POA: Diagnosis not present

## 2018-03-07 DIAGNOSIS — M1990 Unspecified osteoarthritis, unspecified site: Secondary | ICD-10-CM | POA: Diagnosis not present

## 2018-03-07 DIAGNOSIS — M6281 Muscle weakness (generalized): Secondary | ICD-10-CM | POA: Diagnosis not present

## 2018-03-08 DIAGNOSIS — J449 Chronic obstructive pulmonary disease, unspecified: Secondary | ICD-10-CM | POA: Diagnosis not present

## 2018-03-08 DIAGNOSIS — R262 Difficulty in walking, not elsewhere classified: Secondary | ICD-10-CM | POA: Diagnosis not present

## 2018-03-08 DIAGNOSIS — M1990 Unspecified osteoarthritis, unspecified site: Secondary | ICD-10-CM | POA: Diagnosis not present

## 2018-03-08 DIAGNOSIS — I5043 Acute on chronic combined systolic (congestive) and diastolic (congestive) heart failure: Secondary | ICD-10-CM | POA: Diagnosis not present

## 2018-03-08 DIAGNOSIS — E1122 Type 2 diabetes mellitus with diabetic chronic kidney disease: Secondary | ICD-10-CM | POA: Diagnosis not present

## 2018-03-08 DIAGNOSIS — I5042 Chronic combined systolic (congestive) and diastolic (congestive) heart failure: Secondary | ICD-10-CM | POA: Diagnosis not present

## 2018-03-08 DIAGNOSIS — M6281 Muscle weakness (generalized): Secondary | ICD-10-CM | POA: Diagnosis not present

## 2018-03-11 DIAGNOSIS — J449 Chronic obstructive pulmonary disease, unspecified: Secondary | ICD-10-CM | POA: Diagnosis not present

## 2018-03-11 DIAGNOSIS — E1122 Type 2 diabetes mellitus with diabetic chronic kidney disease: Secondary | ICD-10-CM | POA: Diagnosis not present

## 2018-03-11 DIAGNOSIS — M6281 Muscle weakness (generalized): Secondary | ICD-10-CM | POA: Diagnosis not present

## 2018-03-11 DIAGNOSIS — M1990 Unspecified osteoarthritis, unspecified site: Secondary | ICD-10-CM | POA: Diagnosis not present

## 2018-03-13 DIAGNOSIS — E1122 Type 2 diabetes mellitus with diabetic chronic kidney disease: Secondary | ICD-10-CM | POA: Diagnosis not present

## 2018-03-13 DIAGNOSIS — M1990 Unspecified osteoarthritis, unspecified site: Secondary | ICD-10-CM | POA: Diagnosis not present

## 2018-03-13 DIAGNOSIS — J449 Chronic obstructive pulmonary disease, unspecified: Secondary | ICD-10-CM | POA: Diagnosis not present

## 2018-03-13 DIAGNOSIS — M6281 Muscle weakness (generalized): Secondary | ICD-10-CM | POA: Diagnosis not present

## 2018-03-14 DIAGNOSIS — E1122 Type 2 diabetes mellitus with diabetic chronic kidney disease: Secondary | ICD-10-CM | POA: Diagnosis not present

## 2018-03-14 DIAGNOSIS — J449 Chronic obstructive pulmonary disease, unspecified: Secondary | ICD-10-CM | POA: Diagnosis not present

## 2018-03-14 DIAGNOSIS — M6281 Muscle weakness (generalized): Secondary | ICD-10-CM | POA: Diagnosis not present

## 2018-03-14 DIAGNOSIS — M1990 Unspecified osteoarthritis, unspecified site: Secondary | ICD-10-CM | POA: Diagnosis not present

## 2018-03-15 DIAGNOSIS — M1990 Unspecified osteoarthritis, unspecified site: Secondary | ICD-10-CM | POA: Diagnosis not present

## 2018-03-15 DIAGNOSIS — J449 Chronic obstructive pulmonary disease, unspecified: Secondary | ICD-10-CM | POA: Diagnosis not present

## 2018-03-15 DIAGNOSIS — M6281 Muscle weakness (generalized): Secondary | ICD-10-CM | POA: Diagnosis not present

## 2018-03-15 DIAGNOSIS — E1122 Type 2 diabetes mellitus with diabetic chronic kidney disease: Secondary | ICD-10-CM | POA: Diagnosis not present

## 2018-03-16 DIAGNOSIS — M1990 Unspecified osteoarthritis, unspecified site: Secondary | ICD-10-CM | POA: Diagnosis not present

## 2018-03-16 DIAGNOSIS — I471 Supraventricular tachycardia: Secondary | ICD-10-CM | POA: Diagnosis not present

## 2018-03-16 DIAGNOSIS — J449 Chronic obstructive pulmonary disease, unspecified: Secondary | ICD-10-CM | POA: Diagnosis not present

## 2018-03-16 DIAGNOSIS — E1122 Type 2 diabetes mellitus with diabetic chronic kidney disease: Secondary | ICD-10-CM | POA: Diagnosis not present

## 2018-03-18 DIAGNOSIS — M1990 Unspecified osteoarthritis, unspecified site: Secondary | ICD-10-CM | POA: Diagnosis not present

## 2018-03-18 DIAGNOSIS — J449 Chronic obstructive pulmonary disease, unspecified: Secondary | ICD-10-CM | POA: Diagnosis not present

## 2018-03-18 DIAGNOSIS — M6281 Muscle weakness (generalized): Secondary | ICD-10-CM | POA: Diagnosis not present

## 2018-03-18 DIAGNOSIS — E1122 Type 2 diabetes mellitus with diabetic chronic kidney disease: Secondary | ICD-10-CM | POA: Diagnosis not present

## 2018-03-19 ENCOUNTER — Encounter: Payer: Self-pay | Admitting: Internal Medicine

## 2018-03-19 ENCOUNTER — Ambulatory Visit: Payer: Medicare HMO | Admitting: Internal Medicine

## 2018-03-19 VITALS — BP 90/58 | HR 54 | Ht 67.0 in | Wt 180.0 lb

## 2018-03-19 DIAGNOSIS — I10 Essential (primary) hypertension: Secondary | ICD-10-CM

## 2018-03-19 DIAGNOSIS — I428 Other cardiomyopathies: Secondary | ICD-10-CM | POA: Diagnosis not present

## 2018-03-19 DIAGNOSIS — E1121 Type 2 diabetes mellitus with diabetic nephropathy: Secondary | ICD-10-CM | POA: Diagnosis not present

## 2018-03-19 DIAGNOSIS — I129 Hypertensive chronic kidney disease with stage 1 through stage 4 chronic kidney disease, or unspecified chronic kidney disease: Secondary | ICD-10-CM | POA: Diagnosis not present

## 2018-03-19 DIAGNOSIS — N183 Chronic kidney disease, stage 3 (moderate): Secondary | ICD-10-CM | POA: Diagnosis not present

## 2018-03-19 DIAGNOSIS — E1140 Type 2 diabetes mellitus with diabetic neuropathy, unspecified: Secondary | ICD-10-CM | POA: Diagnosis not present

## 2018-03-19 DIAGNOSIS — D696 Thrombocytopenia, unspecified: Secondary | ICD-10-CM | POA: Diagnosis not present

## 2018-03-19 DIAGNOSIS — E785 Hyperlipidemia, unspecified: Secondary | ICD-10-CM

## 2018-03-19 DIAGNOSIS — R69 Illness, unspecified: Secondary | ICD-10-CM | POA: Diagnosis not present

## 2018-03-19 DIAGNOSIS — E440 Moderate protein-calorie malnutrition: Secondary | ICD-10-CM | POA: Diagnosis not present

## 2018-03-19 DIAGNOSIS — D72819 Decreased white blood cell count, unspecified: Secondary | ICD-10-CM | POA: Diagnosis not present

## 2018-03-19 DIAGNOSIS — E113519 Type 2 diabetes mellitus with proliferative diabetic retinopathy with macular edema, unspecified eye: Secondary | ICD-10-CM | POA: Diagnosis not present

## 2018-03-19 LAB — PROTIME-INR

## 2018-03-19 LAB — ACID FAST CULTURE WITH REFLEXED SENSITIVITIES: ACID FAST CULTURE - AFSCU3: NEGATIVE

## 2018-03-19 NOTE — Progress Notes (Signed)
OFFICE NOTE  Chief Complaint:  Follow-up CHF  Primary Care Physician: Harlan Stains, MD  HPI:  Mandy Rhodes is a 77 y.o. female was recently hospitalized for acute combined systolic and diastolic heart failure. Echocardiogram revealed a newly reduced EF of 35-40% with severe LVH was started on IV Lasix and diuresed. Her left heart catheterization revealing a 20% proximal LAD lesion Percent ostial ramus. Echocardiogram was suggestive of infiltrative cardiomyopathy of the septum. May need to consider cardiac MRI in the future. She saw Tenny Craw, PA-C in follow-up in May 2017. She seemed to be doing well at that time however her weight was going up. He increased her Lasix for a few days and then decreased it down to 20 mg QOD. She has been enrolled in home monitoring with [place] [facility] and recently had an increase in her weight over the past week. She was seen in the emergency department on August 22 for back pain and at that time was more short of breath and had some swelling. They increased her Lasix to 40 mg daily and she is currently on that dose. Weight is now down about 5 pounds and her breathing has improved somewhat.  08/21/2016  Mandy Rhodes returns today for follow-up. Her weight is 227, which is increased actually 10 pounds since her last office weight however she denies any worsening swelling. She reports her appetite is increased and this weight gain is related to gating weight back after her recent weight loss. We had referred her for cardiac MRI to evaluate for sarcoidosis however unfortunately her creatinine was markedly elevated over 2.6. Previously it had been 1.6. This was then canceled and we made adjustments on her medications including holding her ARB medication. She was also scheduled to be on Entresto. That was discontinued as well and renal function returned to normal. She was then restarted on low-dose Entresto by our hypertension clinic pharmacist and she will need  reassessment of her renal function.  06/11/2017  Mandy Rhodes was seen today in follow-up for recent hospitalization of acute congestive systolic heart failure. She's been recovering at [facility] rehabilitation. She seems to be doing well and recently saw [place], PA-C, who felt that her weight was slightly below her discharge weight. She decreased her Lasix to 40 mg daily. Mrs. Gauger is been doing well on that dose and watching her diet. She recently had had a PEG tube removed. Weight is been stable. She has twice needed to take additional Lasix. She denies any chest pain or worsening shortness of breath.  03/19/2018  Mandy Rhodes returns today for follow-up.  She has continued to lose weight.  Initially she was 205 pounds and then her 93 pounds.  Today she weighs 180 pounds.  A lot of this may be related to diuresis while being hospitalized however it is noted that her LVEF has declined even further.  The etiology is unclear.  She is also undergoing work-up for what sounds like myelodysplastic syndrome.  She reports eating well even though she is having weight loss which is certainly concerning.  PMHx:  Past Medical History:  Diagnosis Date  . Anemia   . Arthritis   . CAD (coronary artery disease)    a. minimal CAD by cath in 03/2016  . CHF (congestive heart failure) ([place])    a. 09/2016: Echo with EF of 30-35%, moderate diffuse HK, Grade 2 DD, PA peak pressure of 39 mm Hg.  Marland Kitchen Chronic kidney disease    Renal  Insuffiency, see [facility] once a year  . Diabetes mellitus    type 2  . GERD (gastroesophageal reflux disease)   . History of rotator cuff tear    Torn right rotator cuff.  . History of spinal stenosis   . Hypertension   . Infiltrative cardiomyopathy ([place])    a, presumed cardiac amyloidosis by MRI with prior nondiagnostic fat pad biopsy  . Neuropathy associated with endocrine disorder [facility])     Past Surgical History:  Procedure Laterality Date  . ABDOMINAL  HYSTERECTOMY  1971   Partial  . ANTERIOR CERVICAL DECOMP/DISCECTOMY FUSION N/A 10/18/2016   Procedure: debridement and drainage cervical prevertebral abscess  ANTERIOR CERVICAL HARDWARE REMOVAL exploration cervical fusion;  Surgeon: Earnie Larsson, MD;  Location: [place];  Service: Neurosurgery;  Laterality: N/A;  . APPENDECTOMY    . BACK SURGERY  2009   Disectomy  . CARDIAC CATHETERIZATION N/A 03/22/2016   Procedure: Left Heart Cath and Coronary Angiography;  Surgeon: Peter M Martinique, MD;  Location: [place] [facility];  Service: Cardiovascular;  Laterality: N/A;  . CHOLECYSTECTOMY  1969   Status post   . CYST REMOVAL TRUNK Right 12/03/2014   Procedure: EXCISION OF RIGHT BACK CYST ;  Surgeon: Coralie Keens, MD;  Location: [place];  Service: General;  Laterality: Right;  . DIRECT LARYNGOSCOPY N/A 10/17/2016   Procedure: DIRECT LARYNGOSCOPY,  ESOPHAGOSCOPY;  Surgeon: [name], MD;  [facility];  Service: ENT;  Laterality: N/A;  . FOOT SURGERY Bilateral    bone removed  . IR GASTROSTOMY TUBE REMOVAL  05/03/2017  . IR GENERIC HISTORICAL  10/25/2016   IR GASTROSTOMY TUBE MOD SED 10/25/2016 [name], MD [facility]-INTERV RAD  . NEUROPLASTY / TRANSPOSITION MEDIAN NERVE AT CARPAL TUNNEL Bilateral    Hx  Left carpal tunnel repair  . TRACHEOSTOMY TUBE PLACEMENT N/A 10/17/2016   Procedure: TRACHEOSTOMY;  Surgeon: [name], MD;  [facility];  Service: ENT;  Laterality: N/A;    FAMHx:  Family History  Problem Relation Age of Onset  . Diabetes type II Mother   . Hypertension Father   . Diabetes type II Sister   . Ovarian cancer Sister   . Diabetes type II Other   . Stroke Neg Hx   . CAD Neg Hx   . Heart failure Neg Hx     SOCHx:   reports that she has quit smoking. Her smoking use included cigarettes. She has a 8.25 pack-year smoking history. She has never used smokeless tobacco. She reports that she does not drink alcohol or use drugs.  ALLERGIES:  Allergies  Allergen Reactions   . Sulfa Antibiotics Swelling    ROS: Pertinent items noted in HPI and remainder of comprehensive ROS otherwise negative.  HOME MEDS: Current Outpatient Medications  Medication Sig Dispense Refill  . acetaminophen (TYLENOL) 325 MG tablet Take 650 mg by mouth every 6 (six) hours as needed for mild pain.    Marland Kitchen aspirin 81 MG EC tablet Chew 81 mg by mouth daily.     . Calcium Carb-Cholecalciferol (CALCIUM 600 + D PO) Take 1 tablet by mouth every morning.     . ferrous sulfate 325 (65 FE) MG EC tablet Take 325 mg by mouth daily.     . furosemide (LASIX) 80 MG tablet Take 2 tablets (160 mg total) by mouth 2 (two) times daily. 120 tablet 0  . glimepiride (AMARYL) 1 MG tablet Take 1 mg by mouth daily.    . metoprolol succinate (  TOPROL XL) 25 MG 24 hr tablet Take 1 tablet (25 mg total) by mouth daily. 30 tablet 5  . Multiple Vitamin (TAB-A-VITE) TABS Take 1 tablet by mouth every morning.     Marland Kitchen omeprazole (PRILOSEC) 40 MG capsule Take 1 capsule (40 mg total) by mouth daily.    . potassium chloride 20 MEQ TBCR Take 20 mEq by mouth daily. 30 tablet 0   No current facility-administered medications for this visit.     LABS/IMAGING: No results found for this or any previous visit (from the past 48 hour(s)). No results found.  WEIGHTS: Wt Readings from Last 3 Encounters:  03/19/18 180 lb (81.6 kg)  02/12/18 183 lb 4.8 oz (83.1 kg)  01/17/18 190 lb 6.4 oz (86.4 kg)    VITALS: BP (!) 90/58   Pulse (!) 54   Ht 5\' 7"  (1.702 m)   Wt 180 lb (81.6 kg)   BMI 28.19 kg/m   EXAM: General appearance: alert, no distress and moderately obese Neck: no carotid bruit and no JVD Lungs: clear to auscultation bilaterally Heart: regular rate and rhythm Abdomen: soft, non-tender; bowel sounds normal; no masses,  no organomegaly Extremities: edema Trace sock line edema Pulses: 2+ and symmetric Skin: Skin color, texture, turgor normal. No rashes or lesions Neurologic: Grossly normal Psych:  Pleasant  EKG: Sinus bradycardia 54-personally reviewed  ASSESSMENT: 1. Chronic systolic congestive heart failure, LVEF 35-40% -> now down to 25-30% 2. Nonischemic cardiomyopathy-possible infiltrative cardiomyopathy based on echo features 3. Hypertension 4. Dyslipidemia 5. Type 2 diabetes 6. CKD2-3  PLAN: 1.   Mandy Rhodes had worsening congestive heart failure with decrease in LVEF to 25 to 30%.  She was found to have possible myelodysplasia and this makes me additionally concerned about an infiltrative cardiomyopathy.  She also has stage III chronic kidney disease.  She does appear somewhat euvolemic today.  Blood pressure is low.  There is no room to uptitrate her medicines at this time, therefore we will continue current therapy.  Follow-up in 6 months or sooner as necessary.  Pixie Casino, MD, [facility], [place] Director of the Advanced Lipid Disorders &  Cardiovascular Risk Reduction Clinic Diplomate of the American Board of Clinical Lipidology Attending Cardiologist  Direct Dial: [phone]  Fax: [phone]  Website:  [email or web address] 03/19/2018, 10:50 AM

## 2018-03-19 NOTE — Patient Instructions (Signed)
Your physician wants you to follow-up in: 6 months with Almyra Deforest, PA. You will receive a reminder letter in the mail two months in advance. If you don't receive a letter, please call our office to schedule the follow-up appointment.

## 2018-03-20 DIAGNOSIS — M1990 Unspecified osteoarthritis, unspecified site: Secondary | ICD-10-CM | POA: Diagnosis not present

## 2018-03-20 DIAGNOSIS — M6281 Muscle weakness (generalized): Secondary | ICD-10-CM | POA: Diagnosis not present

## 2018-03-20 DIAGNOSIS — J449 Chronic obstructive pulmonary disease, unspecified: Secondary | ICD-10-CM | POA: Diagnosis not present

## 2018-03-20 DIAGNOSIS — E1122 Type 2 diabetes mellitus with diabetic chronic kidney disease: Secondary | ICD-10-CM | POA: Diagnosis not present

## 2018-03-21 DIAGNOSIS — J449 Chronic obstructive pulmonary disease, unspecified: Secondary | ICD-10-CM | POA: Diagnosis not present

## 2018-03-21 DIAGNOSIS — M6281 Muscle weakness (generalized): Secondary | ICD-10-CM | POA: Diagnosis not present

## 2018-03-21 DIAGNOSIS — M1990 Unspecified osteoarthritis, unspecified site: Secondary | ICD-10-CM | POA: Diagnosis not present

## 2018-03-21 DIAGNOSIS — E1122 Type 2 diabetes mellitus with diabetic chronic kidney disease: Secondary | ICD-10-CM | POA: Diagnosis not present

## 2018-03-22 DIAGNOSIS — M1990 Unspecified osteoarthritis, unspecified site: Secondary | ICD-10-CM | POA: Diagnosis not present

## 2018-03-22 DIAGNOSIS — M6281 Muscle weakness (generalized): Secondary | ICD-10-CM | POA: Diagnosis not present

## 2018-03-22 DIAGNOSIS — J449 Chronic obstructive pulmonary disease, unspecified: Secondary | ICD-10-CM | POA: Diagnosis not present

## 2018-03-22 DIAGNOSIS — E1122 Type 2 diabetes mellitus with diabetic chronic kidney disease: Secondary | ICD-10-CM | POA: Diagnosis not present

## 2018-03-27 DIAGNOSIS — E1122 Type 2 diabetes mellitus with diabetic chronic kidney disease: Secondary | ICD-10-CM | POA: Diagnosis not present

## 2018-03-27 DIAGNOSIS — J449 Chronic obstructive pulmonary disease, unspecified: Secondary | ICD-10-CM | POA: Diagnosis not present

## 2018-03-27 DIAGNOSIS — M6281 Muscle weakness (generalized): Secondary | ICD-10-CM | POA: Diagnosis not present

## 2018-03-27 DIAGNOSIS — M1990 Unspecified osteoarthritis, unspecified site: Secondary | ICD-10-CM | POA: Diagnosis not present

## 2018-03-28 DIAGNOSIS — J449 Chronic obstructive pulmonary disease, unspecified: Secondary | ICD-10-CM | POA: Diagnosis not present

## 2018-03-28 DIAGNOSIS — M1990 Unspecified osteoarthritis, unspecified site: Secondary | ICD-10-CM | POA: Diagnosis not present

## 2018-03-28 DIAGNOSIS — M6281 Muscle weakness (generalized): Secondary | ICD-10-CM | POA: Diagnosis not present

## 2018-03-28 DIAGNOSIS — E1122 Type 2 diabetes mellitus with diabetic chronic kidney disease: Secondary | ICD-10-CM | POA: Diagnosis not present

## 2018-04-01 DIAGNOSIS — E1122 Type 2 diabetes mellitus with diabetic chronic kidney disease: Secondary | ICD-10-CM | POA: Diagnosis not present

## 2018-04-01 DIAGNOSIS — M6281 Muscle weakness (generalized): Secondary | ICD-10-CM | POA: Diagnosis not present

## 2018-04-01 DIAGNOSIS — M1990 Unspecified osteoarthritis, unspecified site: Secondary | ICD-10-CM | POA: Diagnosis not present

## 2018-04-01 DIAGNOSIS — J449 Chronic obstructive pulmonary disease, unspecified: Secondary | ICD-10-CM | POA: Diagnosis not present

## 2018-04-03 DIAGNOSIS — M1990 Unspecified osteoarthritis, unspecified site: Secondary | ICD-10-CM | POA: Diagnosis not present

## 2018-04-03 DIAGNOSIS — J449 Chronic obstructive pulmonary disease, unspecified: Secondary | ICD-10-CM | POA: Diagnosis not present

## 2018-04-03 DIAGNOSIS — E1122 Type 2 diabetes mellitus with diabetic chronic kidney disease: Secondary | ICD-10-CM | POA: Diagnosis not present

## 2018-04-03 DIAGNOSIS — M6281 Muscle weakness (generalized): Secondary | ICD-10-CM | POA: Diagnosis not present

## 2018-04-04 DIAGNOSIS — N189 Chronic kidney disease, unspecified: Secondary | ICD-10-CM | POA: Diagnosis not present

## 2018-04-04 DIAGNOSIS — N184 Chronic kidney disease, stage 4 (severe): Secondary | ICD-10-CM | POA: Diagnosis not present

## 2018-04-05 DIAGNOSIS — E1122 Type 2 diabetes mellitus with diabetic chronic kidney disease: Secondary | ICD-10-CM | POA: Diagnosis not present

## 2018-04-05 DIAGNOSIS — M1990 Unspecified osteoarthritis, unspecified site: Secondary | ICD-10-CM | POA: Diagnosis not present

## 2018-04-05 DIAGNOSIS — M6281 Muscle weakness (generalized): Secondary | ICD-10-CM | POA: Diagnosis not present

## 2018-04-05 DIAGNOSIS — J449 Chronic obstructive pulmonary disease, unspecified: Secondary | ICD-10-CM | POA: Diagnosis not present

## 2018-04-07 DIAGNOSIS — I5042 Chronic combined systolic (congestive) and diastolic (congestive) heart failure: Secondary | ICD-10-CM | POA: Diagnosis not present

## 2018-04-07 DIAGNOSIS — J449 Chronic obstructive pulmonary disease, unspecified: Secondary | ICD-10-CM | POA: Diagnosis not present

## 2018-04-07 DIAGNOSIS — I5043 Acute on chronic combined systolic (congestive) and diastolic (congestive) heart failure: Secondary | ICD-10-CM | POA: Diagnosis not present

## 2018-04-07 DIAGNOSIS — M6281 Muscle weakness (generalized): Secondary | ICD-10-CM | POA: Diagnosis not present

## 2018-04-07 DIAGNOSIS — R262 Difficulty in walking, not elsewhere classified: Secondary | ICD-10-CM | POA: Diagnosis not present

## 2018-04-08 DIAGNOSIS — N184 Chronic kidney disease, stage 4 (severe): Secondary | ICD-10-CM | POA: Diagnosis not present

## 2018-04-08 DIAGNOSIS — M1990 Unspecified osteoarthritis, unspecified site: Secondary | ICD-10-CM | POA: Diagnosis not present

## 2018-04-08 DIAGNOSIS — I129 Hypertensive chronic kidney disease with stage 1 through stage 4 chronic kidney disease, or unspecified chronic kidney disease: Secondary | ICD-10-CM | POA: Diagnosis not present

## 2018-04-08 DIAGNOSIS — I5040 Unspecified combined systolic (congestive) and diastolic (congestive) heart failure: Secondary | ICD-10-CM | POA: Diagnosis not present

## 2018-04-08 DIAGNOSIS — M6281 Muscle weakness (generalized): Secondary | ICD-10-CM | POA: Diagnosis not present

## 2018-04-08 DIAGNOSIS — E1122 Type 2 diabetes mellitus with diabetic chronic kidney disease: Secondary | ICD-10-CM | POA: Diagnosis not present

## 2018-04-08 DIAGNOSIS — J449 Chronic obstructive pulmonary disease, unspecified: Secondary | ICD-10-CM | POA: Diagnosis not present

## 2018-04-09 DIAGNOSIS — M6281 Muscle weakness (generalized): Secondary | ICD-10-CM | POA: Diagnosis not present

## 2018-04-09 DIAGNOSIS — M1990 Unspecified osteoarthritis, unspecified site: Secondary | ICD-10-CM | POA: Diagnosis not present

## 2018-04-09 DIAGNOSIS — E1122 Type 2 diabetes mellitus with diabetic chronic kidney disease: Secondary | ICD-10-CM | POA: Diagnosis not present

## 2018-04-09 DIAGNOSIS — J449 Chronic obstructive pulmonary disease, unspecified: Secondary | ICD-10-CM | POA: Diagnosis not present

## 2018-04-10 DIAGNOSIS — E1122 Type 2 diabetes mellitus with diabetic chronic kidney disease: Secondary | ICD-10-CM | POA: Diagnosis not present

## 2018-04-10 DIAGNOSIS — M1990 Unspecified osteoarthritis, unspecified site: Secondary | ICD-10-CM | POA: Diagnosis not present

## 2018-04-10 DIAGNOSIS — M6281 Muscle weakness (generalized): Secondary | ICD-10-CM | POA: Diagnosis not present

## 2018-04-10 DIAGNOSIS — J449 Chronic obstructive pulmonary disease, unspecified: Secondary | ICD-10-CM | POA: Diagnosis not present

## 2018-04-11 DIAGNOSIS — M6281 Muscle weakness (generalized): Secondary | ICD-10-CM | POA: Diagnosis not present

## 2018-04-11 DIAGNOSIS — M1990 Unspecified osteoarthritis, unspecified site: Secondary | ICD-10-CM | POA: Diagnosis not present

## 2018-04-11 DIAGNOSIS — J449 Chronic obstructive pulmonary disease, unspecified: Secondary | ICD-10-CM | POA: Diagnosis not present

## 2018-04-11 DIAGNOSIS — E1122 Type 2 diabetes mellitus with diabetic chronic kidney disease: Secondary | ICD-10-CM | POA: Diagnosis not present

## 2018-04-12 DIAGNOSIS — J449 Chronic obstructive pulmonary disease, unspecified: Secondary | ICD-10-CM | POA: Diagnosis not present

## 2018-04-12 DIAGNOSIS — M1990 Unspecified osteoarthritis, unspecified site: Secondary | ICD-10-CM | POA: Diagnosis not present

## 2018-04-12 DIAGNOSIS — M6281 Muscle weakness (generalized): Secondary | ICD-10-CM | POA: Diagnosis not present

## 2018-04-12 DIAGNOSIS — E1122 Type 2 diabetes mellitus with diabetic chronic kidney disease: Secondary | ICD-10-CM | POA: Diagnosis not present

## 2018-04-15 DIAGNOSIS — J449 Chronic obstructive pulmonary disease, unspecified: Secondary | ICD-10-CM | POA: Diagnosis not present

## 2018-04-15 DIAGNOSIS — E1122 Type 2 diabetes mellitus with diabetic chronic kidney disease: Secondary | ICD-10-CM | POA: Diagnosis not present

## 2018-04-15 DIAGNOSIS — M1990 Unspecified osteoarthritis, unspecified site: Secondary | ICD-10-CM | POA: Diagnosis not present

## 2018-04-15 DIAGNOSIS — M6281 Muscle weakness (generalized): Secondary | ICD-10-CM | POA: Diagnosis not present

## 2018-04-16 ENCOUNTER — Ambulatory Visit: Payer: Medicare HMO | Admitting: Pulmonary Disease

## 2018-04-16 ENCOUNTER — Encounter: Payer: Self-pay | Admitting: Pulmonary Disease

## 2018-04-16 ENCOUNTER — Ambulatory Visit (INDEPENDENT_AMBULATORY_CARE_PROVIDER_SITE_OTHER)
Admission: RE | Admit: 2018-04-16 | Discharge: 2018-04-16 | Disposition: A | Discharge status: Home / Self Care | Payer: Medicare HMO | Source: Ambulatory Visit | Attending: Pulmonary Disease | Admitting: Pulmonary Disease

## 2018-04-16 VITALS — BP 110/62 | HR 55 | Ht 67.0 in | Wt 170.6 lb

## 2018-04-16 DIAGNOSIS — J90 Pleural effusion, not elsewhere classified: Secondary | ICD-10-CM

## 2018-04-16 NOTE — Progress Notes (Signed)
Mandy Rhodes    [id number]    Aug 04, 1941  Primary Care Physician:White, Caren Griffins, MD  Referring Physician: Harlan Stains, MD [place], [place] [address]  Chief complaint: Follow-up for pleural effusion  HPI: 77 year old with history of systolic, diastolic heart failure, EF 30-35%, stage IV chronic kidney disease  recent admission in December 2018 with acute heart failure, suspected community-acquired pneumonia.  She was diuresed aggressively in consultation with cardiology and nephrology.  Chest x-ray at that time noted with left pleural effusion which has improved on follow-up chest x-ray December 10, 2017 She had a CT scan in feb 2019 which showed persistent left effusion and sent to pulmonary for further evaluation  History noted for admission in 2017 with acute respiratory failure, aspiration pneumonia, non-ST elevation MI She has about 33-pack-year smoking history.  Quit in the mid 1990s.  Interim history: She is here after follow-up chest x-ray for review.  States that breathing is doing well Denies any cough, sputum production, fevers, chills.  Outpatient Encounter Medications as of 04/16/2018  Medication Sig  . acetaminophen (TYLENOL) 325 MG tablet Take 650 mg by mouth every 6 (six) hours as needed for mild pain.  Marland Kitchen aspirin 81 MG EC tablet Chew 81 mg by mouth daily.   . Calcium Carb-Cholecalciferol (CALCIUM 600 + D PO) Take 1 tablet by mouth every morning.   . ferrous sulfate 325 (65 FE) MG EC tablet Take 325 mg by mouth daily.   . furosemide (LASIX) 80 MG tablet Take 2 tablets (160 mg total) by mouth 2 (two) times daily.  . metoprolol succinate (TOPROL XL) 25 MG 24 hr tablet Take 1 tablet (25 mg total) by mouth daily.  . Multiple Vitamin (TAB-A-VITE) TABS Take 1 tablet by mouth every morning.   Marland Kitchen omeprazole (PRILOSEC) 40 MG capsule Take 1 capsule (40 mg total) by mouth daily.  . potassium chloride 20 MEQ TBCR Take 20 mEq by mouth  daily.  . [DISCONTINUED] glimepiride (AMARYL) 1 MG tablet Take 1 mg by mouth daily.   No facility-administered encounter medications on file as of 04/16/2018.     Allergies as of 04/16/2018 - Review Complete 04/16/2018  Allergen Reaction Noted  . Sulfa antibiotics Swelling 09/01/2011    Past Medical History:  Diagnosis Date  . Anemia   . Arthritis   . CAD (coronary artery disease)    a. minimal CAD by cath in 03/2016  . CHF (congestive heart failure) ([place])    a. 09/2016: Echo with EF of 30-35%, moderate diffuse HK, Grade 2 DD, PA peak pressure of 39 mm Hg.  Marland Kitchen Chronic kidney disease    Renal Insuffiency, see [facility] once a year  . Diabetes mellitus    type 2  . GERD (gastroesophageal reflux disease)   . History of rotator cuff tear    Torn right rotator cuff.  . History of spinal stenosis   . Hypertension   . Infiltrative cardiomyopathy ([place])    a, presumed cardiac amyloidosis by MRI with prior nondiagnostic fat pad biopsy  . Neuropathy associated with endocrine disorder [facility])     Past Surgical History:  Procedure Laterality Date  . ABDOMINAL HYSTERECTOMY  1971   Partial  . ANTERIOR CERVICAL DECOMP/DISCECTOMY FUSION N/A 10/18/2016   Procedure: debridement and drainage cervical prevertebral abscess  ANTERIOR CERVICAL HARDWARE REMOVAL exploration cervical fusion;  Surgeon: Earnie Larsson, MD;  Location: [place];  Service: Neurosurgery;  Laterality: N/A;  .  APPENDECTOMY    . BACK SURGERY  2009   Disectomy  . CARDIAC CATHETERIZATION N/A 03/22/2016   Procedure: Left Heart Cath and Coronary Angiography;  Surgeon: Peter M Martinique, MD;  Location: [place] [facility];  Service: Cardiovascular;  Laterality: N/A;  . CHOLECYSTECTOMY  1969   Status post   . CYST REMOVAL TRUNK Right 12/03/2014   Procedure: EXCISION OF RIGHT BACK CYST ;  Surgeon: Coralie Keens, MD;  Location: [place];  Service: General;  Laterality: Right;  . DIRECT LARYNGOSCOPY N/A 10/17/2016   Procedure: DIRECT  LARYNGOSCOPY,  ESOPHAGOSCOPY;  Surgeon: [name], MD;  [facility];  Service: ENT;  Laterality: N/A;  . FOOT SURGERY Bilateral    bone removed  . IR GASTROSTOMY TUBE REMOVAL  05/03/2017  . IR GENERIC HISTORICAL  10/25/2016   IR GASTROSTOMY TUBE MOD SED 10/25/2016 [name], MD [facility]INTER[facility]  . NEUROPLASTY / TRANSPOSITION MEDIAN NERVE AT CARPAL TUNNEL Bilateral    Hx  Left carpal tunnel repair  . TRACHEOSTOMY TUBE PLACEMENT N/A 10/17/2016   Procedure: TRACHEOSTOMY;  Surgeon: [name], MD;  [facility];  Service: ENT;  Laterality: N/A;    Family History  Problem Relation Age of Onset  . Diabetes type II Mother   . Hypertension Father   . Diabetes type II Sister   . Ovarian cancer Sister   . Diabetes type II Other   . Stroke Neg Hx   . CAD Neg Hx   . Heart failure Neg Hx     Social History   Socioeconomic History  . Marital status: Divorced    Spouse name: Not on file  . Number of children: Not on file  . Years of education: Not on file  . Highest education level: Not on file  Occupational History  . Occupation: retired  Scientific laboratory technician  . Financial resource strain: Not on file  . Food insecurity:    Worry: Not on file    Inability: Not on file  . Transportation needs:    Medical: Not on file    Non-medical: Not on file  Tobacco Use  . Smoking status: Former Smoker    Packs/day: 0.25    Years: 33.00    Pack years: 8.25    Types: Cigarettes  . Smokeless tobacco: Never Used  . Tobacco comment: 12/02/13- quit over 20 years ago/smoked 33 years on and off  Substance and Sexual Activity  . Alcohol use: No  . Drug use: No  . Sexual activity: Not Currently  Lifestyle  . Physical activity:    Days per week: Not on file    Minutes per session: Not on file  . Stress: Not on file  Relationships  . Social connections:    Talks on phone: Not on file    Gets together: Not on file    Attends religious service: Not on file    Active member of club or  organization: Not on file    Attends meetings of clubs or organizations: Not on file    Relationship status: Not on file  . Intimate partner violence:    Fear of current or ex partner: Not on file    Emotionally abused: Not on file    Physically abused: Not on file    Forced sexual activity: Not on file  Other Topics Concern  . Not on file  Social History Narrative  . Not on file    Review of systems: Review of Systems  Constitutional: Negative  for fever and chills.  HENT: Negative.   Eyes: Negative for blurred vision.  Respiratory: as per HPI  Cardiovascular: Negative for chest pain and palpitations.  Gastrointestinal: Negative for vomiting, diarrhea, blood per rectum. Genitourinary: Negative for dysuria, urgency, frequency and hematuria.  Musculoskeletal: Negative for myalgias, back pain and joint pain.  Skin: Negative for itching and rash.  Neurological: Negative for dizziness, tremors, focal weakness, seizures and loss of consciousness.  Endo/Heme/Allergies: Negative for environmental allergies.  Psychiatric/Behavioral: Negative for depression, suicidal ideas and hallucinations.  All other systems reviewed and are negative.  Physical Exam: Blood pressure 110/62, pulse (!) 55, height 5\' 7"  (1.702 m), weight 170 lb 9.6 oz (77.4 kg), SpO2 99 %. Gen:      No acute distress HEENT:  EOMI, sclera anicteric Neck:     No masses; no thyromegaly Lungs:    Clear to auscultation bilaterally; normal respiratory effort CV:         Regular rate and rhythm; no murmurs Abd:      + bowel sounds; soft, non-tender; no palpable masses, no distension Ext:    No edema; adequate peripheral perfusion Skin:      Warm and dry; no rash Neuro: alert and oriented x 3 Psych: normal mood and affect  Data Reviewed: CT chest 10/13/16-dense consolidation in the left lower lobe CT angio 11/17/16- small bilateral pleural effusion, patchy airspace opacity CT chest 12/24/17- moderate left pleural effusion,  passive atelectasis of the left lower lobe.  Trace right effusion.  Cardiomegaly with mild interstitial pulmonary edema, CHF. Chest x-ray 04/16/18-stable left effusion, atelectasis/consolidation. I reviewed the images personally.  Assessment:  Pleural effusion CT noted for moderate effusion on the left and trace effusion on the right, associated with atelectasis.  No evidence of acute pneumonia and clinically she appears well Suspect effusion is secondary to chronic heart failure, volume overload, chronic kidney disease There is no obvious underlying mass suspicious for malignancy  Reviewed the repeat x-ray which shows persistent effusion with associated atelectasis/consolidation Discussed options including thoracentesis for further evaluation but patient does not want to do any invasive tests We will continue to monitor this.  Follow-up chest x-ray in 4 months.  Plan/Recommendations: - Follow up CXR in 4 months.   Marshell Garfinkel MD [facility] 04/16/2018, 12:07 PM  CC: Harlan Stains, MD

## 2018-04-16 NOTE — Patient Instructions (Signed)
Your chest x-ray today shows that the fluid in the left lung is still there Since you do not want to go ahead with the procedure for further evaluation we will continue to monitor this I will see her back in 4 months with a chest x-ray Please call us sooner if there is any change in his symptoms.

## 2018-04-17 DIAGNOSIS — J449 Chronic obstructive pulmonary disease, unspecified: Secondary | ICD-10-CM | POA: Diagnosis not present

## 2018-04-17 DIAGNOSIS — E1122 Type 2 diabetes mellitus with diabetic chronic kidney disease: Secondary | ICD-10-CM | POA: Diagnosis not present

## 2018-04-17 DIAGNOSIS — M6281 Muscle weakness (generalized): Secondary | ICD-10-CM | POA: Diagnosis not present

## 2018-04-17 DIAGNOSIS — M1990 Unspecified osteoarthritis, unspecified site: Secondary | ICD-10-CM | POA: Diagnosis not present

## 2018-04-19 DIAGNOSIS — E1122 Type 2 diabetes mellitus with diabetic chronic kidney disease: Secondary | ICD-10-CM | POA: Diagnosis not present

## 2018-04-19 DIAGNOSIS — J449 Chronic obstructive pulmonary disease, unspecified: Secondary | ICD-10-CM | POA: Diagnosis not present

## 2018-04-19 DIAGNOSIS — M6281 Muscle weakness (generalized): Secondary | ICD-10-CM | POA: Diagnosis not present

## 2018-04-19 DIAGNOSIS — M1990 Unspecified osteoarthritis, unspecified site: Secondary | ICD-10-CM | POA: Diagnosis not present

## 2018-04-21 DIAGNOSIS — R69 Illness, unspecified: Secondary | ICD-10-CM | POA: Diagnosis not present

## 2018-04-22 DIAGNOSIS — M6281 Muscle weakness (generalized): Secondary | ICD-10-CM | POA: Diagnosis not present

## 2018-04-22 DIAGNOSIS — M1990 Unspecified osteoarthritis, unspecified site: Secondary | ICD-10-CM | POA: Diagnosis not present

## 2018-04-22 DIAGNOSIS — E1122 Type 2 diabetes mellitus with diabetic chronic kidney disease: Secondary | ICD-10-CM | POA: Diagnosis not present

## 2018-04-22 DIAGNOSIS — J449 Chronic obstructive pulmonary disease, unspecified: Secondary | ICD-10-CM | POA: Diagnosis not present

## 2018-04-23 DIAGNOSIS — H43813 Vitreous degeneration, bilateral: Secondary | ICD-10-CM | POA: Diagnosis not present

## 2018-04-23 DIAGNOSIS — H3582 Retinal ischemia: Secondary | ICD-10-CM | POA: Diagnosis not present

## 2018-04-23 DIAGNOSIS — E113591 Type 2 diabetes mellitus with proliferative diabetic retinopathy without macular edema, right eye: Secondary | ICD-10-CM | POA: Diagnosis not present

## 2018-04-23 DIAGNOSIS — E113512 Type 2 diabetes mellitus with proliferative diabetic retinopathy with macular edema, left eye: Secondary | ICD-10-CM | POA: Diagnosis not present

## 2018-04-24 DIAGNOSIS — M6281 Muscle weakness (generalized): Secondary | ICD-10-CM | POA: Diagnosis not present

## 2018-04-24 DIAGNOSIS — M1990 Unspecified osteoarthritis, unspecified site: Secondary | ICD-10-CM | POA: Diagnosis not present

## 2018-04-24 DIAGNOSIS — E1122 Type 2 diabetes mellitus with diabetic chronic kidney disease: Secondary | ICD-10-CM | POA: Diagnosis not present

## 2018-04-24 DIAGNOSIS — J449 Chronic obstructive pulmonary disease, unspecified: Secondary | ICD-10-CM | POA: Diagnosis not present

## 2018-04-25 ENCOUNTER — Telehealth: Payer: Self-pay | Admitting: Hematology and Oncology

## 2018-04-25 DIAGNOSIS — J449 Chronic obstructive pulmonary disease, unspecified: Secondary | ICD-10-CM | POA: Diagnosis not present

## 2018-04-25 DIAGNOSIS — E1122 Type 2 diabetes mellitus with diabetic chronic kidney disease: Secondary | ICD-10-CM | POA: Diagnosis not present

## 2018-04-25 DIAGNOSIS — M6281 Muscle weakness (generalized): Secondary | ICD-10-CM | POA: Diagnosis not present

## 2018-04-25 DIAGNOSIS — M1990 Unspecified osteoarthritis, unspecified site: Secondary | ICD-10-CM | POA: Diagnosis not present

## 2018-04-25 NOTE — Telephone Encounter (Signed)
Called pt re appts being rescheduled to [facility] - left vm for pt and sending confirmation letter in the mail.

## 2018-04-26 DIAGNOSIS — E1122 Type 2 diabetes mellitus with diabetic chronic kidney disease: Secondary | ICD-10-CM | POA: Diagnosis not present

## 2018-04-26 DIAGNOSIS — J449 Chronic obstructive pulmonary disease, unspecified: Secondary | ICD-10-CM | POA: Diagnosis not present

## 2018-04-26 DIAGNOSIS — M1990 Unspecified osteoarthritis, unspecified site: Secondary | ICD-10-CM | POA: Diagnosis not present

## 2018-04-26 DIAGNOSIS — M6281 Muscle weakness (generalized): Secondary | ICD-10-CM | POA: Diagnosis not present

## 2018-04-29 DIAGNOSIS — E1122 Type 2 diabetes mellitus with diabetic chronic kidney disease: Secondary | ICD-10-CM | POA: Diagnosis not present

## 2018-04-29 DIAGNOSIS — J449 Chronic obstructive pulmonary disease, unspecified: Secondary | ICD-10-CM | POA: Diagnosis not present

## 2018-04-29 DIAGNOSIS — M1990 Unspecified osteoarthritis, unspecified site: Secondary | ICD-10-CM | POA: Diagnosis not present

## 2018-04-29 DIAGNOSIS — M6281 Muscle weakness (generalized): Secondary | ICD-10-CM | POA: Diagnosis not present

## 2018-05-01 DIAGNOSIS — E1122 Type 2 diabetes mellitus with diabetic chronic kidney disease: Secondary | ICD-10-CM | POA: Diagnosis not present

## 2018-05-01 DIAGNOSIS — M1990 Unspecified osteoarthritis, unspecified site: Secondary | ICD-10-CM | POA: Diagnosis not present

## 2018-05-01 DIAGNOSIS — J449 Chronic obstructive pulmonary disease, unspecified: Secondary | ICD-10-CM | POA: Diagnosis not present

## 2018-05-01 DIAGNOSIS — M6281 Muscle weakness (generalized): Secondary | ICD-10-CM | POA: Diagnosis not present

## 2018-05-07 ENCOUNTER — Telehealth: Payer: Self-pay | Admitting: Internal Medicine

## 2018-05-07 DIAGNOSIS — J449 Chronic obstructive pulmonary disease, unspecified: Secondary | ICD-10-CM | POA: Diagnosis not present

## 2018-05-07 DIAGNOSIS — E1122 Type 2 diabetes mellitus with diabetic chronic kidney disease: Secondary | ICD-10-CM | POA: Diagnosis not present

## 2018-05-07 DIAGNOSIS — M1990 Unspecified osteoarthritis, unspecified site: Secondary | ICD-10-CM | POA: Diagnosis not present

## 2018-05-07 DIAGNOSIS — M6281 Muscle weakness (generalized): Secondary | ICD-10-CM | POA: Diagnosis not present

## 2018-05-07 NOTE — Telephone Encounter (Signed)
Patient called to reschedule  °

## 2018-05-08 DIAGNOSIS — J449 Chronic obstructive pulmonary disease, unspecified: Secondary | ICD-10-CM | POA: Diagnosis not present

## 2018-05-08 DIAGNOSIS — I5043 Acute on chronic combined systolic (congestive) and diastolic (congestive) heart failure: Secondary | ICD-10-CM | POA: Diagnosis not present

## 2018-05-08 DIAGNOSIS — R262 Difficulty in walking, not elsewhere classified: Secondary | ICD-10-CM | POA: Diagnosis not present

## 2018-05-08 DIAGNOSIS — M6281 Muscle weakness (generalized): Secondary | ICD-10-CM | POA: Diagnosis not present

## 2018-05-08 DIAGNOSIS — I5042 Chronic combined systolic (congestive) and diastolic (congestive) heart failure: Secondary | ICD-10-CM | POA: Diagnosis not present

## 2018-05-09 DIAGNOSIS — M6281 Muscle weakness (generalized): Secondary | ICD-10-CM | POA: Diagnosis not present

## 2018-05-09 DIAGNOSIS — J449 Chronic obstructive pulmonary disease, unspecified: Secondary | ICD-10-CM | POA: Diagnosis not present

## 2018-05-09 DIAGNOSIS — E1122 Type 2 diabetes mellitus with diabetic chronic kidney disease: Secondary | ICD-10-CM | POA: Diagnosis not present

## 2018-05-09 DIAGNOSIS — M1990 Unspecified osteoarthritis, unspecified site: Secondary | ICD-10-CM | POA: Diagnosis not present

## 2018-05-13 DIAGNOSIS — M1990 Unspecified osteoarthritis, unspecified site: Secondary | ICD-10-CM | POA: Diagnosis not present

## 2018-05-13 DIAGNOSIS — J449 Chronic obstructive pulmonary disease, unspecified: Secondary | ICD-10-CM | POA: Diagnosis not present

## 2018-05-13 DIAGNOSIS — M6281 Muscle weakness (generalized): Secondary | ICD-10-CM | POA: Diagnosis not present

## 2018-05-13 DIAGNOSIS — E1122 Type 2 diabetes mellitus with diabetic chronic kidney disease: Secondary | ICD-10-CM | POA: Diagnosis not present

## 2018-05-15 DIAGNOSIS — R69 Illness, unspecified: Secondary | ICD-10-CM | POA: Diagnosis not present

## 2018-05-26 ENCOUNTER — Encounter (HOSPITAL_COMMUNITY): Payer: Self-pay | Admitting: Emergency Medicine

## 2018-05-26 ENCOUNTER — Emergency Department (HOSPITAL_COMMUNITY): Payer: Medicare HMO

## 2018-05-26 ENCOUNTER — Other Ambulatory Visit: Payer: Self-pay

## 2018-05-26 ENCOUNTER — Emergency Department (HOSPITAL_COMMUNITY)
Admission: EM | Admit: 2018-05-26 | Discharge: 2018-05-26 | Disposition: A | Discharge status: Home / Self Care | Payer: Medicare HMO | Attending: Emergency Medicine | Admitting: Emergency Medicine

## 2018-05-26 DIAGNOSIS — Z79899 Other long term (current) drug therapy: Secondary | ICD-10-CM | POA: Diagnosis not present

## 2018-05-26 DIAGNOSIS — E1122 Type 2 diabetes mellitus with diabetic chronic kidney disease: Secondary | ICD-10-CM | POA: Insufficient documentation

## 2018-05-26 DIAGNOSIS — R072 Precordial pain: Secondary | ICD-10-CM | POA: Diagnosis not present

## 2018-05-26 DIAGNOSIS — N183 Chronic kidney disease, stage 3 (moderate): Secondary | ICD-10-CM | POA: Diagnosis not present

## 2018-05-26 DIAGNOSIS — I130 Hypertensive heart and chronic kidney disease with heart failure and stage 1 through stage 4 chronic kidney disease, or unspecified chronic kidney disease: Secondary | ICD-10-CM | POA: Diagnosis not present

## 2018-05-26 DIAGNOSIS — I5042 Chronic combined systolic (congestive) and diastolic (congestive) heart failure: Secondary | ICD-10-CM | POA: Diagnosis not present

## 2018-05-26 DIAGNOSIS — I959 Hypotension, unspecified: Secondary | ICD-10-CM | POA: Diagnosis not present

## 2018-05-26 DIAGNOSIS — R7989 Other specified abnormal findings of blood chemistry: Secondary | ICD-10-CM | POA: Insufficient documentation

## 2018-05-26 DIAGNOSIS — Z7982 Long term (current) use of aspirin: Secondary | ICD-10-CM | POA: Insufficient documentation

## 2018-05-26 DIAGNOSIS — I9589 Other hypotension: Secondary | ICD-10-CM | POA: Insufficient documentation

## 2018-05-26 DIAGNOSIS — R609 Edema, unspecified: Secondary | ICD-10-CM | POA: Diagnosis not present

## 2018-05-26 DIAGNOSIS — R0789 Other chest pain: Secondary | ICD-10-CM | POA: Diagnosis not present

## 2018-05-26 DIAGNOSIS — R778 Other specified abnormalities of plasma proteins: Secondary | ICD-10-CM

## 2018-05-26 DIAGNOSIS — Z87891 Personal history of nicotine dependence: Secondary | ICD-10-CM | POA: Insufficient documentation

## 2018-05-26 DIAGNOSIS — R079 Chest pain, unspecified: Secondary | ICD-10-CM | POA: Diagnosis not present

## 2018-05-26 LAB — CBC
HCT: 32.6 % — ABNORMAL LOW (ref 36.0–46.0)
HEMOGLOBIN: 10.6 g/dL — AB (ref 12.0–15.0)
MCH: 31.2 pg (ref 26.0–34.0)
MCHC: 32.5 g/dL (ref 30.0–36.0)
MCV: 95.9 fL (ref 78.0–100.0)
Platelets: 124 10*3/uL — ABNORMAL LOW (ref 150–400)
RBC: 3.4 MIL/uL — AB (ref 3.87–5.11)
RDW: 16.3 % — AB (ref 11.5–15.5)
WBC: 7.5 10*3/uL (ref 4.0–10.5)

## 2018-05-26 LAB — I-STAT TROPONIN, ED
TROPONIN I, POC: 0.31 ng/mL — AB (ref 0.00–0.08)
Troponin i, poc: 0.32 ng/mL (ref 0.00–0.08)

## 2018-05-26 LAB — BASIC METABOLIC PANEL
ANION GAP: 11 (ref 5–15)
BUN: 77 mg/dL — ABNORMAL HIGH (ref 8–23)
CALCIUM: 9.4 mg/dL (ref 8.9–10.3)
CO2: 22 mmol/L (ref 22–32)
Chloride: 101 mmol/L (ref 98–111)
Creatinine, Ser: 2.64 mg/dL — ABNORMAL HIGH (ref 0.44–1.00)
GFR, EST AFRICAN AMERICAN: 19 mL/min — AB (ref >60)
GFR, EST NON AFRICAN AMERICAN: 16 mL/min — AB (ref >60)
Glucose, Bld: 196 mg/dL — ABNORMAL HIGH (ref 70–99)
Potassium: 4 mmol/L (ref 3.5–5.1)
Sodium: 134 mmol/L — ABNORMAL LOW (ref 135–145)

## 2018-05-26 LAB — BRAIN NATRIURETIC PEPTIDE: B Natriuretic Peptide: 1289.3 pg/mL — ABNORMAL HIGH (ref 0.0–100.0)

## 2018-05-26 NOTE — ED Provider Notes (Signed)
[facility] EMERGENCY DEPARTMENT Provider Note   CSN: [id number] Arrival date & time: 05/26/18  0820     History   Chief Complaint Chief Complaint  Patient presents with  . Chest Pain    HPI Mandy Rhodes is a 77 y.o. female.  Patient with h/o systolic/diastolic heart failure, EF 25-30%, stage IV chronic kidney disease, recent admission in December 2018 with acute heart failure, suspected community-acquired pneumonia, possible myelodysplasic disorder, minimal coronary artery disease on cath in 2017 -- presents with complaint of chest pain starting approximately 10 PM last night.  Patient states that she began having a runny nose and then developed a soreness in her chest and shoulder area.  She did not have any associated shortness of breath, diaphoresis, vomiting.  Symptoms continued overnight and she was having trouble sleeping sitting up in a chair.  She does not feel like this is her congestive heart failure.  She does have some lower extremity swelling but attributes this to having slept sitting up last night.  No abdominal pain or urinary symptoms.  Patient continues to take her home furosemide.  EMS was called at the urging of her son.  She received aspirin and 300 cc of normal saline en route.  Patient reports minimal discomfort at the current time.  The onset of this condition was acute. The course is improving. Aggravating factors: none. Alleviating factors: none.        Past Medical History:  Diagnosis Date  . Anemia   . Arthritis   . CAD (coronary artery disease)    a. minimal CAD by cath in 03/2016  . CHF (congestive heart failure) ([place])    a. 09/2016: Echo with EF of 30-35%, moderate diffuse HK, Grade 2 DD, PA peak pressure of 39 mm Hg.  Marland Kitchen Chronic kidney disease    Renal Insuffiency, see [facility] once a year  . Diabetes mellitus    type 2  . GERD (gastroesophageal reflux disease)   . History of rotator cuff tear    Torn right rotator  cuff.  . History of spinal stenosis   . Hypertension   . Infiltrative cardiomyopathy ([place])    a, presumed cardiac amyloidosis by MRI with prior nondiagnostic fat pad biopsy  . Neuropathy associated with endocrine disorder [facility])     Patient Active Problem List   Diagnosis Date Noted  . Thrombocytopenia ([place]) 11/06/2017  . Prolonged Q-T interval on ECG 11/06/2017  . Combined systolic and diastolic congestive heart failure ([place]) 11/05/2017  . Leukopenia 05/17/2017  . Acute on chronic diastolic CHF (congestive heart failure) ([place]) 04/03/2017  . Chronic knee pain 03/13/2017  . Mental status change resolved 11/24/2016  . UTI due to extended-spectrum beta lactamase (ESBL) producing Escherichia coli 11/22/2016  . Dysphagia 11/01/2016  . Vertebral osteomyelitis ([place])   . Laryngeal mass 10/16/2016  . Anemia 10/16/2016  . Demand ischemia ([place]) 10/16/2016  . PAT (paroxysmal atrial tachycardia) ([place]) 10/14/2016  . Malnutrition of moderate degree 10/14/2016  . Acute respiratory failure with hypoxia ([place]) 10/13/2016  . Pressure injury of skin 10/13/2016  . Acute kidney injury superimposed on chronic kidney disease ([place])   . Acute on chronic combined systolic and diastolic CHF (congestive heart failure) ([place])   . Edema 08/23/2016  . Nonischemic cardiomyopathy ([place]) 07/14/2016  . Hyperlipidemia 07/14/2016  . Cardiomegaly 03/17/2016  . CKD (chronic kidney disease), stage III ([place]) 03/17/2016  . Essential hypertension 03/17/2016  . Diabetes mellitus ([place]) 09/29/2011    Past  Surgical History:  Procedure Laterality Date  . ABDOMINAL HYSTERECTOMY  1971   Partial  . ANTERIOR CERVICAL DECOMP/DISCECTOMY FUSION N/A 10/18/2016   Procedure: debridement and drainage cervical prevertebral abscess  ANTERIOR CERVICAL HARDWARE REMOVAL exploration cervical fusion;  Surgeon: Earnie Larsson, MD;  Location: [facility];  Service: Neurosurgery;  Laterality: N/A;  . APPENDECTOMY    . BACK SURGERY  2009   Disectomy  .  CARDIAC CATHETERIZATION N/A 03/22/2016   Procedure: Left Heart Cath and Coronary Angiography;  Surgeon: Peter M Martinique, MD;  Location: [facility];  Service: Cardiovascular;  Laterality: N/A;  . CHOLECYSTECTOMY  1969   Status post   . CYST REMOVAL TRUNK Right 12/03/2014   Procedure: EXCISION OF RIGHT BACK CYST ;  Surgeon: Coralie Keens, MD;  Location: [facility];  Service: General;  Laterality: Right;  . DIRECT LARYNGOSCOPY N/A 10/17/2016   Procedure: DIRECT LARYNGOSCOPY,  ESOPHAGOSCOPY;  Surgeon: [name], MD;  Location: [facility];  Service: ENT;  Laterality: N/A;  . FOOT SURGERY Bilateral    bone removed  . IR GASTROSTOMY TUBE REMOVAL  05/03/2017  . IR GENERIC HISTORICAL  10/25/2016   IR GASTROSTOMY TUBE MOD SED 10/25/2016 [name], MD [facility]  . NEUROPLASTY / TRANSPOSITION MEDIAN NERVE AT CARPAL TUNNEL Bilateral    Hx  Left carpal tunnel repair  . TRACHEOSTOMY TUBE PLACEMENT N/A 10/17/2016   Procedure: TRACHEOSTOMY;  Surgeon: [name], MD;  Location: [facility];  Service: ENT;  Laterality: N/A;     OB History   None      Home Medications    Prior to Admission medications   Medication Sig Start Date End Date Taking? Authorizing Provider  acetaminophen (TYLENOL) 325 MG tablet Take 650 mg by mouth every 6 (six) hours as needed for mild pain.    [provider]  aspirin 81 MG EC tablet Chew 81 mg by mouth daily.     [provider]  Calcium Carb-Cholecalciferol (CALCIUM 600 + D PO) Take 1 tablet by mouth every morning.     [provider]  ferrous sulfate 325 (65 FE) MG EC tablet Take 325 mg by mouth daily.     [provider]  furosemide (LASIX) 80 MG tablet Take 2 tablets (160 mg total) by mouth 2 (two) times daily. 11/11/17   Hongalgi, Lenis Dickinson, MD  metoprolol succinate (TOPROL XL) 25 MG 24 hr tablet Take 1 tablet (25 mg total) by mouth daily. 12/26/17   Pixie Casino, MD  Multiple Vitamin (TAB-A-VITE) TABS Take 1 tablet by  mouth every morning.     [provider]  omeprazole (PRILOSEC) 40 MG capsule Take 1 capsule (40 mg total) by mouth daily. 11/11/17   Hongalgi, Lenis Dickinson, MD  potassium chloride 20 MEQ TBCR Take 20 mEq by mouth daily. 11/11/17   Modena Jansky, MD    Family History Family History  Problem Relation Age of Onset  . Diabetes type II Mother   . Hypertension Father   . Diabetes type II Sister   . Ovarian cancer Sister   . Diabetes type II Other   . Stroke Neg Hx   . CAD Neg Hx   . Heart failure Neg Hx     Social History Social History   Tobacco Use  . Smoking status: Former Smoker    Packs/day: 0.25    Years: 33.00    Pack years: 8.25    Types: Cigarettes  . Smokeless tobacco: Never Used  .  Tobacco comment: 12/02/13- quit over 20 years ago/smoked 33 years on and off  Substance Use Topics  . Alcohol use: No  . Drug use: No     Allergies   Sulfa antibiotics   Review of Systems Review of Systems  Constitutional: Negative for diaphoresis and fever.  HENT: Positive for rhinorrhea.   Eyes: Negative for redness.  Respiratory: Negative for cough and shortness of breath.   Cardiovascular: Positive for chest pain. Negative for palpitations and leg swelling.  Gastrointestinal: Negative for abdominal pain, nausea and vomiting.  Genitourinary: Negative for dysuria.  Musculoskeletal: Negative for back pain and neck pain.  Skin: Negative for rash.  Neurological: Negative for syncope and light-headedness.  Psychiatric/Behavioral: The patient is not nervous/anxious.      Physical Exam Updated Vital Signs BP (!) 90/55 (BP Location: Left Arm)   Pulse 62   Temp 98 F (36.7 C) (Oral)   Resp 18   Ht 5\' 7"  (1.702 m)   Wt 93.4 kg (206 lb)   SpO2 96%   BMI 32.26 kg/m   Physical Exam  Constitutional: She appears well-developed and well-nourished.  HENT:  Head: Normocephalic and atraumatic.  Mouth/Throat: Mucous membranes are normal. Mucous membranes are not dry.    Eyes: Conjunctivae are normal.  Neck: Trachea normal and normal range of motion. Neck supple. JVD present. Normal carotid pulses present. No muscular tenderness present. Carotid bruit is not present. No tracheal deviation present.  Cardiovascular: Normal rate, regular rhythm, S1 normal, S2 normal, normal heart sounds and intact distal pulses. Exam reveals no decreased pulses.  No murmur heard. Pulmonary/Chest: Effort normal. No respiratory distress. She has no wheezes. She has no rhonchi. She has no rales. She exhibits no tenderness.  Abdominal: Soft. Normal aorta and bowel sounds are normal. There is no tenderness. There is no rebound and no guarding.  Musculoskeletal: Normal range of motion.       Right lower leg: She exhibits edema (1-2+ to ankles).       Left lower leg: She exhibits edema (1-2+ to ankles).  Neurological: She is alert.  Skin: Skin is warm and dry. She is not diaphoretic. No cyanosis. No pallor.  Psychiatric: She has a normal mood and affect.  Nursing note and vitals reviewed.    ED Treatments / Results  Labs (all labs ordered are listed, but only abnormal results are displayed) Labs Reviewed  BASIC METABOLIC PANEL - Abnormal; Notable for the following components:      Result Value   Sodium 134 (*)    Glucose, Bld 196 (*)    BUN 77 (*)    Creatinine, Ser 2.64 (*)    GFR calc non Af Amer 16 (*)    GFR calc Af Amer 19 (*)    All other components within normal limits  CBC - Abnormal; Notable for the following components:   RBC 3.40 (*)    Hemoglobin 10.6 (*)    HCT 32.6 (*)    RDW 16.3 (*)    Platelets 124 (*)    All other components within normal limits  BRAIN NATRIURETIC PEPTIDE - Abnormal; Notable for the following components:   B Natriuretic Peptide 1,289.3 (*)    All other components within normal limits  I-STAT TROPONIN, ED - Abnormal; Notable for the following components:   Troponin i, poc 0.31 (*)    All other components within normal limits  I-STAT  TROPONIN, ED - Abnormal; Notable for the following components:   Troponin i, poc 0.32 (*)  All other components within normal limits    EKG EKG Interpretation  Date/Time:  Sunday May 26 2018 08:30:41 EDT Ventricular Rate:  62 PR Interval:    QRS Duration: 123 QT Interval:  440 QTC Calculation: 447 R Axis:   75 Text Interpretation:  Sinus rhythm Short PR interval Nonspecific intraventricular conduction delay Probable lateral infarct, age indeterminate Confirmed by Pattricia Boss [id number]) on 05/26/2018 9:25:04 AM   Radiology Dg Chest 2 View  Result Date: 05/26/2018 CLINICAL DATA:  Chest pain EXAM: CHEST - 2 VIEW COMPARISON:  Apr 16, 2018 FINDINGS: There is persistent left lower lobe consolidation with left pleural effusion. Right lung is clear. There is cardiomegaly with pulmonary vascularity within normal limits. No adenopathy. Bones are osteoporotic. IMPRESSION: Persistent left pleural effusion with left lower lobe consolidation. Lungs elsewhere clear. Stable cardiomegaly. Bones osteoporotic. Electronically Signed   By: Lowella Grip III M.D.   On: 05/26/2018 09:29    Procedures Procedures (including critical care time)  Medications Ordered in ED Medications - No data to display   Initial Impression / Assessment and Plan / ED Course  I have reviewed the triage vital signs and the nursing notes.  Pertinent labs & imaging results that were available during my care of the patient were reviewed by me and considered in my medical decision making (see chart for details).     Patient seen and examined. Work-up initiated. No STEMI on EKG.   Vital signs reviewed and are as follows: BP (!) 90/55 (BP Location: Left Arm)   Pulse 62   Temp 98 F (36.7 C) (Oral)   Resp 18   Ht 5\' 7"  (1.702 m)   Wt 93.4 kg (206 lb)   SpO2 96%   BMI 32.26 kg/m   Troponin elevated. Discussed with Dr. Jeanell Sparrow. Troponin during admission 12/17-12/23/18 was 0.34-0.37 and felt to be likely demand from  CHF.   Patient seen with Dr. Jeanell Sparrow. Will check delta troponin to ensure stability.   Repeat EKG unchanged and delta troponin is stable.  Patient informed.  She is comfortable with discharge to home.  She wants to go home.  Son is at bedside.  Encourage PCP follow-up this coming week for recheck.  Patient agrees to return with worsening or changing symptoms.  Patient was counseled to return with severe chest pain, especially if the pain is crushing or pressure-like and spreads to the arms, back, neck, or jaw, or if they have sweating, nausea, or shortness of breath with the pain. They were encouraged to call 911 with these symptoms.   They were also told to return if their chest pain gets worse and does not go away with rest, they have an attack of chest pain lasting longer than usual despite rest and treatment with the medications their caregiver has prescribed, if they wake from sleep with chest pain or shortness of breath, if they feel dizzy or faint, if they have chest pain not typical of their usual pain, or if they have any other emergent concerns regarding their health.  The patient verbalized understanding and agreed.    Final Clinical Impressions(s) / ED Diagnoses   Final diagnoses:  Precordial pain  Elevated troponin  Hypotension, chronic   Chest pain: Atypical.  Patient thinks it was gas.  Troponin is elevated chronically dating back to previous hospitalization.  Patient had 3-hour marker which was stable for her.  Repeat EKG was unchanged.  Patient's symptoms are currently resolved.    Patient is not short of  breath and I do not suspect PE.  She does not appear to be volume overloaded today.  BNP is stable.  Chest x-ray with chronic effusion however no new edema.  Patient walked without hypoxia.  Hypotension noted.  Patient is asymptomatic from this.  She does not have any lightheadedness or syncope.  Previous cardiology visit shows similar blood pressures and this is likely chronic.   No concern for sepsis. BP 90/58 during cardiology visit 4/30, 100's-110 other visits.   ED Discharge Orders    None       Carlisle Cater, Hershal Coria 05/26/18 1312    Pattricia Boss, MD 05/26/18 [phone]

## 2018-05-26 NOTE — ED Notes (Signed)
Patient ambulated with walker and 2 person assist into hallway. Oxygen saturation stayed above 95% at all times. Pt denied any difficulty breathing while walking.

## 2018-05-26 NOTE — ED Notes (Signed)
Patient transported to X-ray 

## 2018-05-26 NOTE — ED Triage Notes (Addendum)
Per [facility] pt coming from home c/o chest tightness onset of 10pm last night along with a runny nose. Patient took tylenol with no relief. EMS gave pt 324 aspirin and 300CC NS en route. Hx of CHF pt has lower leg edema.   CBG 187 BP 90/53 Patient states this is her normal BP. HR 66

## 2018-05-26 NOTE — Discharge Instructions (Signed)
Please read and follow all provided instructions.  Your diagnoses today include:  1. Precordial pain   2. Elevated troponin   3. Hypotension, chronic     Tests performed today include:  An EKG of your heart  A chest x-ray  Cardiac enzymes - a blood test for heart muscle damage, yours is chronically elevated and unchanged today  Blood counts and electrolytes  Vital signs. See below for your results today.   Medications prescribed:   None  Take any prescribed medications only as directed.  Follow-up instructions: Please follow-up with your primary care provider next week for further evaluation of your symptoms.   Return instructions:  SEEK IMMEDIATE MEDICAL ATTENTION IF:  You have severe chest pain, especially if the pain is crushing or pressure-like and spreads to the arms, back, neck, or jaw, or if you have sweating, nausea (feeling sick to your stomach), or shortness of breath. THIS IS AN EMERGENCY. Don't wait to see if the pain will go away. Get medical help at once. Call 911 or 0 (operator). DO NOT drive yourself to the hospital.   Your chest pain gets worse and does not go away with rest.   You have an attack of chest pain lasting longer than usual, despite rest and treatment with the medications your caregiver has prescribed.   You wake from sleep with chest pain or shortness of breath.  You feel dizzy or faint.  You have chest pain not typical of your usual pain for which you originally saw your caregiver.   You have any other emergent concerns regarding your health.  Additional Information: Chest pain comes from many different causes. Your caregiver has diagnosed you as having chest pain that is not specific for one problem, but does not require admission.  You are at low risk for an acute heart condition or other serious illness.   Your vital signs today were: BP (!) 89/60    Pulse (!) 58    Temp 98 F (36.7 C) (Oral)    Resp 20    Ht 5\' 7"  (1.702 m)    Wt  93.4 kg (206 lb)    SpO2 98%    BMI 32.26 kg/m  If your blood pressure (BP) was elevated above 135/85 this visit, please have this repeated by your doctor within one month. --------------

## 2018-06-04 DIAGNOSIS — I5023 Acute on chronic systolic (congestive) heart failure: Secondary | ICD-10-CM | POA: Diagnosis not present

## 2018-06-04 DIAGNOSIS — R252 Cramp and spasm: Secondary | ICD-10-CM | POA: Diagnosis not present

## 2018-06-04 DIAGNOSIS — N184 Chronic kidney disease, stage 4 (severe): Secondary | ICD-10-CM | POA: Diagnosis not present

## 2018-06-06 ENCOUNTER — Encounter: Payer: Self-pay | Admitting: Cardiology

## 2018-06-06 ENCOUNTER — Ambulatory Visit: Payer: Medicare HMO | Admitting: Cardiology

## 2018-06-06 VITALS — BP 98/72 | HR 100 | Ht 67.0 in | Wt 171.0 lb

## 2018-06-06 DIAGNOSIS — I428 Other cardiomyopathies: Secondary | ICD-10-CM

## 2018-06-06 DIAGNOSIS — I5043 Acute on chronic combined systolic (congestive) and diastolic (congestive) heart failure: Secondary | ICD-10-CM

## 2018-06-06 DIAGNOSIS — N184 Chronic kidney disease, stage 4 (severe): Secondary | ICD-10-CM

## 2018-06-06 DIAGNOSIS — J90 Pleural effusion, not elsewhere classified: Secondary | ICD-10-CM | POA: Diagnosis not present

## 2018-06-06 MED ORDER — METOLAZONE 5 MG PO TABS: ORAL_TABLET | ORAL | 0 refills | Status: DC

## 2018-06-06 NOTE — Assessment & Plan Note (Signed)
Pt was not taking her Lasix correctly

## 2018-06-06 NOTE — Assessment & Plan Note (Signed)
infiltrative CM. EF 25% Dec 2018 with severe LVH

## 2018-06-06 NOTE — Assessment & Plan Note (Signed)
Being followed by Pulmonary service. The pt at this time declines consideration for thoracentesis

## 2018-06-06 NOTE — Progress Notes (Signed)
06/06/2018 Mandy Rhodes   14-Oct-1941  [id number]  Primary Physician Harlan Stains, MD Primary Cardiologist: Dr Debara Pickett  HPI:  Pleasant 77 y/o AA female with a history of infiltrative NICM, CRI-4 followed by Dr Joelyn Oms, and chronic combined CHF. She is in the office today after an ED visit 05/26/18. She presented then with chest "tightness". Cath in May 2017 showed minor CAD. Her last echo Dec 2018 showed severe LVH with an EF of 25%. The pt's BNP in the ED was 1200. No medication changes were recommended. She is in the office today accompanied by her granddaughter. The pt's main complaint today is LE edema. On interview it became clear that the patient was confused about how much Lasix she was supposed to be taking. Records indicate she was supposed to be on 160 mg of Lasix BID but she was only taking 80 mg BID.    Current Outpatient Medications  Medication Sig Dispense Refill  . acetaminophen (TYLENOL) 325 MG tablet Take 650 mg by mouth every 6 (six) hours as needed for mild pain.    Marland Kitchen aspirin 81 MG EC tablet Chew 81 mg by mouth daily.     . Calcium Carb-Cholecalciferol (CALCIUM 600 + D PO) Take 1 tablet by mouth every morning.     . docusate sodium (COLACE) 100 MG capsule Take 100 mg by mouth daily.    . ferrous sulfate 325 (65 FE) MG EC tablet Take 325 mg by mouth daily.     . furosemide (LASIX) 80 MG tablet Take 160 mg by mouth 2 (two) times daily.    . hydroxypropyl methylcellulose / hypromellose (ISOPTO TEARS / GONIOVISC) 2.5 % ophthalmic solution Place 1 drop into both eyes as needed for dry eyes.    . metoprolol succinate (TOPROL-XL) 25 MG 24 hr tablet Take 12.5 mg by mouth daily.    . Multiple Vitamin (TAB-A-VITE) TABS Take 1 tablet by mouth every morning.     Marland Kitchen omeprazole (PRILOSEC) 40 MG capsule Take 1 capsule (40 mg total) by mouth daily.    . potassium chloride 20 MEQ TBCR Take 20 mEq by mouth daily. 30 tablet 0  . trolamine salicylate (ASPERCREME) 10 % cream Apply 1  application topically as needed for muscle pain.    . metolazone (ZAROXOLYN) 5 MG tablet ONE TIME DOSE TODAY before evening dose of Lasix 1 tablet 0   No current facility-administered medications for this visit.     Allergies  Allergen Reactions  . Sulfa Antibiotics Swelling    Past Medical History:  Diagnosis Date  . Anemia   . Arthritis   . CAD (coronary artery disease)    a. minimal CAD by cath in 03/2016  . CHF (congestive heart failure) ([place])    a. 09/2016: Echo with EF of 30-35%, moderate diffuse HK, Grade 2 DD, PA peak pressure of 39 mm Hg.  Marland Kitchen Chronic kidney disease    Renal Insuffiency, see [facility] once a year  . Diabetes mellitus    type 2  . GERD (gastroesophageal reflux disease)   . History of rotator cuff tear    Torn right rotator cuff.  . History of spinal stenosis   . Hypertension   . Infiltrative cardiomyopathy ([place])    a, presumed cardiac amyloidosis by MRI with prior nondiagnostic fat pad biopsy  . Neuropathy associated with endocrine disorder [facility])     Social History   Socioeconomic History  . Marital status: Divorced    Spouse name: Not  on file  . Number of children: Not on file  . Years of education: Not on file  . Highest education level: Not on file  Occupational History  . Occupation: retired  Scientific laboratory technician  . Financial resource strain: Not on file  . Food insecurity:    Worry: Not on file    Inability: Not on file  . Transportation needs:    Medical: Not on file    Non-medical: Not on file  Tobacco Use  . Smoking status: Former Smoker    Packs/day: 0.25    Years: 33.00    Pack years: 8.25    Types: Cigarettes  . Smokeless tobacco: Never Used  . Tobacco comment: 12/02/13- quit over 20 years ago/smoked 33 years on and off  Substance and Sexual Activity  . Alcohol use: No  . Drug use: No  . Sexual activity: Not Currently  Lifestyle  . Physical activity:    Days per week: Not on file    Minutes per session: Not on file  .  Stress: Not on file  Relationships  . Social connections:    Talks on phone: Not on file    Gets together: Not on file    Attends religious service: Not on file    Active member of club or organization: Not on file    Attends meetings of clubs or organizations: Not on file    Relationship status: Not on file  . Intimate partner violence:    Fear of current or ex partner: Not on file    Emotionally abused: Not on file    Physically abused: Not on file    Forced sexual activity: Not on file  Other Topics Concern  . Not on file  Social History Narrative  . Not on file     Family History  Problem Relation Age of Onset  . Diabetes type II Mother   . Hypertension Father   . Diabetes type II Sister   . Ovarian cancer Sister   . Diabetes type II Other   . Stroke Neg Hx   . CAD Neg Hx   . Heart failure Neg Hx      Review of Systems: General: negative for chills, fever, night sweats or weight changes.  Cardiovascular: negative for orthopnea, palpitations, paroxysmal nocturnal dyspnea  Dermatological: negative for rash Respiratory: negative for cough or wheezing Urologic: negative for hematuria Abdominal: negative for nausea, vomiting, diarrhea, bright red blood per rectum, melena, or hematemesis Neurologic: negative for visual changes, syncope, or dizziness All other systems reviewed and are otherwise negative except as noted above.    Blood pressure 98/72, pulse 100, height 5\' 7"  (1.702 m), weight 171 lb (77.6 kg), SpO2 98 %.  General appearance: alert, cooperative, no distress and in wheel chair Neck: no JVD Lungs: decreased 1/2 on Lt Heart: regular rate and rhythm Extremities: 2-3+ bilate LE edema Skin: Skin color, texture, turgor normal. No rashes or lesions Neurologic: Grossly normal   ASSESSMENT AND PLAN:   Acute on chronic combined systolic and diastolic CHF  Pt was not taking her Lasix correctly  CKD (chronic kidney disease) stage 4, GFR 15-29 ml/min  (HCC) Followed by Dr Joelyn Oms  Nonischemic cardiomyopathy [facility]) infiltrative CM. EF 25% Dec 2018 with severe LVH Doubt she would be a candidate for Tafamidis but will review that with Dr Debara Pickett  Pleural effusion on left Being followed by Pulmonary service. The pt at this time declines consideration for thoracentesis    PLAN  I  suggested one dose of Metolazone 5 mg and increasing her lasix back to her baseline dose of 160 mg BID. She'll need a f/u in one week with a BMP.   Kerin Ransom PA-C 06/06/2018 8:33 AM

## 2018-06-06 NOTE — Patient Instructions (Addendum)
Medication Instructions:  TODAY only take metolazone 5 mg (20-30 mins prior to evening dose of Lasix)   Make sure you are taking furosemide (Lasix) 160 mg (2 x 80 mg tablet) two times daily  Follow-Up: Next Thursday 7/25 at 11:00 AM with Kerin Ransom PA   Any Other Special Instructions Will Be Listed Below (If Applicable).     If you need a refill on your cardiac medications before your next appointment, please call your pharmacy.

## 2018-06-06 NOTE — Assessment & Plan Note (Signed)
Followed by Dr Sanford 

## 2018-06-07 ENCOUNTER — Other Ambulatory Visit: Payer: Self-pay | Admitting: Internal Medicine

## 2018-06-07 DIAGNOSIS — J449 Chronic obstructive pulmonary disease, unspecified: Secondary | ICD-10-CM | POA: Diagnosis not present

## 2018-06-07 DIAGNOSIS — I5042 Chronic combined systolic (congestive) and diastolic (congestive) heart failure: Secondary | ICD-10-CM | POA: Diagnosis not present

## 2018-06-07 DIAGNOSIS — M6281 Muscle weakness (generalized): Secondary | ICD-10-CM | POA: Diagnosis not present

## 2018-06-07 DIAGNOSIS — R262 Difficulty in walking, not elsewhere classified: Secondary | ICD-10-CM | POA: Diagnosis not present

## 2018-06-07 DIAGNOSIS — I5043 Acute on chronic combined systolic (congestive) and diastolic (congestive) heart failure: Secondary | ICD-10-CM | POA: Diagnosis not present

## 2018-06-07 NOTE — Telephone Encounter (Signed)
Rx request sent to pharmacy.  

## 2018-06-11 ENCOUNTER — Other Ambulatory Visit: Payer: Medicare HMO

## 2018-06-11 ENCOUNTER — Ambulatory Visit: Payer: Medicare HMO | Admitting: Internal Medicine

## 2018-06-12 ENCOUNTER — Telehealth: Payer: Self-pay | Admitting: Oncology

## 2018-06-12 NOTE — Telephone Encounter (Signed)
Tried to reach regarding voicemail °

## 2018-06-12 NOTE — Progress Notes (Deleted)
[facility] OFFICE PROGRESS NOTE  Harlan Stains, MD [place] [id number]  DIAGNOSIS: ***  PRIOR THERAPY:  CURRENT THERAPY:  INTERVAL HISTORY: Mandy Rhodes 77 y.o. female returns for *** regular *** visit for followup of ***   MEDICAL HISTORY: Past Medical History:  Diagnosis Date  . Anemia   . Arthritis   . CAD (coronary artery disease)    a. minimal CAD by cath in 03/2016  . CHF (congestive heart failure) ([place])    a. 09/2016: Echo with EF of 30-35%, moderate diffuse HK, Grade 2 DD, PA peak pressure of 39 mm Hg.  Marland Kitchen Chronic kidney disease    Renal Insuffiency, see [facility] once a year  . Diabetes mellitus    type 2  . GERD (gastroesophageal reflux disease)   . History of rotator cuff tear    Torn right rotator cuff.  . History of spinal stenosis   . Hypertension   . Infiltrative cardiomyopathy ([place])    a, presumed cardiac amyloidosis by MRI with prior nondiagnostic fat pad biopsy  . Neuropathy associated with endocrine disorder (HCC)     ALLERGIES:  is allergic to sulfa antibiotics.  MEDICATIONS:  Current Outpatient Medications  Medication Sig Dispense Refill  . acetaminophen (TYLENOL) 325 MG tablet Take 650 mg by mouth every 6 (six) hours as needed for mild pain.    Marland Kitchen aspirin 81 MG EC tablet Chew 81 mg by mouth daily.     . Calcium Carb-Cholecalciferol (CALCIUM 600 + D PO) Take 1 tablet by mouth every morning.     . docusate sodium (COLACE) 100 MG capsule Take 100 mg by mouth daily.    . ferrous sulfate 325 (65 FE) MG EC tablet Take 325 mg by mouth daily.     . furosemide (LASIX) 80 MG tablet Take 160 mg by mouth 2 (two) times daily.    . hydroxypropyl methylcellulose / hypromellose (ISOPTO TEARS / GONIOVISC) 2.5 % ophthalmic solution Place 1 drop into both eyes as needed for dry eyes.    . metolazone (ZAROXOLYN) 5 MG tablet ONE TIME DOSE TODAY before evening dose of Lasix 1 tablet 0  . metoprolol succinate  (TOPROL-XL) 25 MG 24 hr tablet Take 12.5 mg by mouth daily.    . metoprolol succinate (TOPROL-XL) 25 MG 24 hr tablet Take 0.5 tablets (12.5 mg total) by mouth daily. 45 tablet 1  . Multiple Vitamin (TAB-A-VITE) TABS Take 1 tablet by mouth every morning.     Marland Kitchen omeprazole (PRILOSEC) 40 MG capsule Take 1 capsule (40 mg total) by mouth daily.    . potassium chloride 20 MEQ TBCR Take 20 mEq by mouth daily. 30 tablet 0  . trolamine salicylate (ASPERCREME) 10 % cream Apply 1 application topically as needed for muscle pain.     No current facility-administered medications for this visit.     SURGICAL HISTORY:  Past Surgical History:  Procedure Laterality Date  . ABDOMINAL HYSTERECTOMY  1971   Partial  . ANTERIOR CERVICAL DECOMP/DISCECTOMY FUSION N/A 10/18/2016   Procedure: debridement and drainage cervical prevertebral abscess  ANTERIOR CERVICAL HARDWARE REMOVAL exploration cervical fusion;  Surgeon: Earnie Larsson, MD;  Location: [place];  Service: Neurosurgery;  Laterality: N/A;  . APPENDECTOMY    . BACK SURGERY  2009   Disectomy  . CARDIAC CATHETERIZATION N/A 03/22/2016   Procedure: Left Heart Cath and Coronary Angiography;  Surgeon: Peter M Martinique, MD;  Location: [facility];  Service: Cardiovascular;  Laterality: N/A;  . CHOLECYSTECTOMY  1969   Status post   . CYST REMOVAL TRUNK Right 12/03/2014   Procedure: EXCISION OF RIGHT BACK CYST ;  Surgeon: Coralie Keens, MD;  Location: [facility];  Service: General;  Laterality: Right;  . DIRECT LARYNGOSCOPY N/A 10/17/2016   Procedure: DIRECT LARYNGOSCOPY,  ESOPHAGOSCOPY;  Surgeon: [name], MD;  Location: [facility];  Service: ENT;  Laterality: N/A;  . FOOT SURGERY Bilateral    bone removed  . IR GASTROSTOMY TUBE REMOVAL  05/03/2017  . IR GENERIC HISTORICAL  10/25/2016   IR GASTROSTOMY TUBE MOD SED 10/25/2016 [name], MD [facility]  . NEUROPLASTY / TRANSPOSITION MEDIAN NERVE AT CARPAL TUNNEL Bilateral    Hx  Left carpal tunnel repair  .  TRACHEOSTOMY TUBE PLACEMENT N/A 10/17/2016   Procedure: TRACHEOSTOMY;  Surgeon: [name], MD;  Location: [facility];  Service: ENT;  Laterality: N/A;    REVIEW OF SYSTEMS:   Review of Systems  Constitutional: Negative for appetite change, chills, fatigue, fever and unexpected weight change.  HENT:   Negative for mouth sores, nosebleeds, sore throat and trouble swallowing.   Eyes: Negative for eye problems and icterus.  Respiratory: Negative for cough, hemoptysis, shortness of breath and wheezing.   Cardiovascular: Negative for chest pain and leg swelling.  Gastrointestinal: Negative for abdominal pain, constipation, diarrhea, nausea and vomiting.  Genitourinary: Negative for bladder incontinence, difficulty urinating, dysuria, frequency and hematuria.   Musculoskeletal: Negative for back pain, gait problem, neck pain and neck stiffness.  Skin: Negative for itching and rash.  Neurological: Negative for dizziness, extremity weakness, gait problem, headaches, light-headedness and seizures.  Hematological: Negative for adenopathy. Does not bruise/bleed easily.  Psychiatric/Behavioral: Negative for confusion, depression and sleep disturbance. The patient is not nervous/anxious.     PHYSICAL EXAMINATION:  There were no vitals taken for this visit.  ECOG PERFORMANCE STATUS: {CHL ONC ECOG [id number]  Physical Exam  Constitutional: Oriented to person, place, and time and well-developed, well-nourished, and in no distress. No distress.  HENT:  Head: Normocephalic and atraumatic.  Mouth/Throat: Oropharynx is clear and moist. No oropharyngeal exudate.  Eyes: Conjunctivae are normal. Right eye exhibits no discharge. Left eye exhibits no discharge. No scleral icterus.  Neck: Normal range of motion. Neck supple.  Cardiovascular: Normal rate, regular rhythm, normal heart sounds and intact distal pulses.   Pulmonary/Chest: Effort normal and breath sounds normal. No respiratory distress. No  wheezes. No rales.  Abdominal: Soft. Bowel sounds are normal. Exhibits no distension and no mass. There is no tenderness.  Musculoskeletal: Normal range of motion. Exhibits no edema.  Lymphadenopathy:    No cervical adenopathy.  Neurological: Alert and oriented to person, place, and time. Exhibits normal muscle tone. Gait normal. Coordination normal.  Skin: Skin is warm and dry. No rash noted. Not diaphoretic. No erythema. No pallor.  Psychiatric: Mood, memory and judgment normal.  Vitals reviewed.  LABORATORY DATA: Lab Results  Component Value Date   WBC 7.5 05/26/2018   HGB 10.6 (L) 05/26/2018   HCT 32.6 (L) 05/26/2018   MCV 95.9 05/26/2018   PLT 124 (L) 05/26/2018      Chemistry      Component Value Date/Time   NA 134 (L) 05/26/2018 0845   NA 144 12/26/2017 1040   NA 139 01/13/2015 1016   K 4.0 05/26/2018 0845   K 4.2 01/13/2015 1016   CL 101 05/26/2018 0845   CL 108 (H) 12/27/2012 0849   CO2 22  05/26/2018 0845   CO2 24 01/13/2015 1016   BUN 77 (H) 05/26/2018 0845   BUN 76 (HH) 12/26/2017 1040   BUN 32.8 (H) 01/13/2015 1016   CREATININE 2.64 (H) 05/26/2018 0845   CREATININE 2.96 (H) 02/12/2018 0925   CREATININE 1.55 (H) 10/03/2016 1550   CREATININE 2.1 (H) 01/13/2015 1016   GLU 85 05/15/2017      Component Value Date/Time   CALCIUM 9.4 05/26/2018 0845   CALCIUM 9.9 01/13/2015 1016   ALKPHOS 105 02/12/2018 0925   ALKPHOS 74 01/13/2015 1016   AST 27 02/12/2018 0925   AST 17 01/13/2015 1016   ALT 25 02/12/2018 0925   ALT 13 01/13/2015 1016   BILITOT 1.3 (H) 02/12/2018 0925   BILITOT 0.45 01/13/2015 1016       RADIOGRAPHIC STUDIES:  Dg Chest 2 View  Result Date: 05/26/2018 CLINICAL DATA:  Chest pain EXAM: CHEST - 2 VIEW COMPARISON:  Apr 16, 2018 FINDINGS: There is persistent left lower lobe consolidation with left pleural effusion. Right lung is clear. There is cardiomegaly with pulmonary vascularity within normal limits. No adenopathy. Bones are  osteoporotic. IMPRESSION: Persistent left pleural effusion with left lower lobe consolidation. Lungs elsewhere clear. Stable cardiomegaly. Bones osteoporotic. Electronically Signed   By: Lowella Grip III M.D.   On: 05/26/2018 09:29     ASSESSMENT/PLAN:  No problem-specific Assessment & Plan notes found for this encounter.  No orders of the defined types were placed in this encounter.  Mikey Bussing, DNP, AGPCNP-BC, AOCNP 06/12/18

## 2018-06-13 ENCOUNTER — Ambulatory Visit: Payer: Medicare HMO | Admitting: Cardiology

## 2018-06-13 ENCOUNTER — Ambulatory Visit: Payer: Medicare HMO | Admitting: Oncology

## 2018-06-13 ENCOUNTER — Other Ambulatory Visit: Payer: Medicare HMO

## 2018-06-13 ENCOUNTER — Telehealth: Payer: Self-pay | Admitting: Internal Medicine

## 2018-06-13 ENCOUNTER — Encounter: Payer: Self-pay | Admitting: Cardiology

## 2018-06-13 VITALS — BP 92/60 | HR 90 | Ht 67.0 in | Wt 172.0 lb

## 2018-06-13 DIAGNOSIS — J90 Pleural effusion, not elsewhere classified: Secondary | ICD-10-CM | POA: Diagnosis not present

## 2018-06-13 DIAGNOSIS — I428 Other cardiomyopathies: Secondary | ICD-10-CM | POA: Diagnosis not present

## 2018-06-13 DIAGNOSIS — I5043 Acute on chronic combined systolic (congestive) and diastolic (congestive) heart failure: Secondary | ICD-10-CM

## 2018-06-13 DIAGNOSIS — N184 Chronic kidney disease, stage 4 (severe): Secondary | ICD-10-CM

## 2018-06-13 DIAGNOSIS — I5033 Acute on chronic diastolic (congestive) heart failure: Secondary | ICD-10-CM

## 2018-06-13 NOTE — Patient Instructions (Addendum)
Medication Instructions: Your physician recommends that you continue on your current medications as directed. Please refer to the Current Medication list given to you today.   Labwork: TODAY: BMP CBC BNP  Procedures/Testing: None Ordered  Follow-Up: Your physician recommends that you schedule a follow-up appointment in: 3 months with Dr.Hilty    Any Additional Special Instructions Will Be Listed Below (If Applicable).  Your physician recommends that you try Tylenol PM   We will call you if we need to make changes to your medications after we get your lab work back   If you need a refill on your cardiac medications before your next appointment, please call your pharmacy.

## 2018-06-13 NOTE — Telephone Encounter (Signed)
Patient called to reschedule  °

## 2018-06-13 NOTE — Progress Notes (Signed)
06/13/2018 Mandy Rhodes   Oct 01, 1941  [id number]  Primary Physician Harlan Stains, MD Primary Cardiologist: Debara Pickett  HPI:  77 y/o AA female with a history of infiltrative NICM, CRI-4 followed by Dr Joelyn Oms, and chronic combined CHF. She is in the office today after an ED visit 05/26/18. She presented then with chest "tightness". Cath in May 2017 showed minor CAD. Her last echo Dec 2018 showed severe LVH with an EF of 25%. The pt's BNP in the ED was 1200. No medication changes were recommended. She was seen in the office 06/06/18. The pt's main complaint was LE edema. On interview it became clear that the patient was confused about how much Lasix she was supposed to be taking. Records indicate she was supposed to be on 160 mg of Lasix BID but she was only taking 80 mg BID. I had her take one dose of Zaroxolyn 5 mg and increase her daily Lasix to 160 mg BID. She returns today for follow up. She says she is doing better. She still has LE edema  But I believe she has some chronic problems with this. She says she is on the corrent dose of Lasix now- 160 mg BID.      Current Outpatient Medications  Medication Sig Dispense Refill  . acetaminophen (TYLENOL) 325 MG tablet Take 650 mg by mouth every 6 (six) hours as needed for mild pain.    Marland Kitchen aspirin 81 MG EC tablet Chew 81 mg by mouth daily.     . Calcium Carb-Cholecalciferol (CALCIUM 600 + D PO) Take 1 tablet by mouth every morning.     . docusate sodium (COLACE) 100 MG capsule Take 100 mg by mouth daily.    . ferrous sulfate 325 (65 FE) MG EC tablet Take 325 mg by mouth daily.     . furosemide (LASIX) 80 MG tablet Take 160 mg by mouth 2 (two) times daily.    . metoprolol succinate (TOPROL-XL) 25 MG 24 hr tablet Take 0.5 tablets (12.5 mg total) by mouth daily. 45 tablet 1  . Multiple Vitamin (TAB-A-VITE) TABS Take 1 tablet by mouth every morning.     Marland Kitchen omeprazole (PRILOSEC) 40 MG capsule Take 1 capsule (40 mg total) by mouth daily.    . potassium  chloride 20 MEQ TBCR Take 20 mEq by mouth daily. 30 tablet 0  . trolamine salicylate (ASPERCREME) 10 % cream Apply 1 application topically as needed for muscle pain.    . hydroxypropyl methylcellulose / hypromellose (ISOPTO TEARS / GONIOVISC) 2.5 % ophthalmic solution Place 1 drop into both eyes as needed for dry eyes.    . metolazone (ZAROXOLYN) 5 MG tablet ONE TIME DOSE TODAY before evening dose of Lasix (Patient not taking: Reported on 06/13/2018) 1 tablet 0  . metoprolol succinate (TOPROL-XL) 25 MG 24 hr tablet Take 12.5 mg by mouth daily.     No current facility-administered medications for this visit.     Allergies  Allergen Reactions  . Sulfa Antibiotics Swelling    Past Medical History:  Diagnosis Date  . Anemia   . Arthritis   . CAD (coronary artery disease)    a. minimal CAD by cath in 03/2016  . CHF (congestive heart failure) ([place])    a. 09/2016: Echo with EF of 30-35%, moderate diffuse HK, Grade 2 DD, PA peak pressure of 39 mm Hg.  Marland Kitchen Chronic kidney disease    Renal Insuffiency, see [facility] once a year  . Diabetes mellitus  type 2  . GERD (gastroesophageal reflux disease)   . History of rotator cuff tear    Torn right rotator cuff.  . History of spinal stenosis   . Hypertension   . Infiltrative cardiomyopathy ([place])    a, presumed cardiac amyloidosis by MRI with prior nondiagnostic fat pad biopsy  . Neuropathy associated with endocrine disorder [facility])     Social History   Socioeconomic History  . Marital status: Divorced    Spouse name: Not on file  . Number of children: Not on file  . Years of education: Not on file  . Highest education level: Not on file  Occupational History  . Occupation: retired  Scientific laboratory technician  . Financial resource strain: Not on file  . Food insecurity:    Worry: Not on file    Inability: Not on file  . Transportation needs:    Medical: Not on file    Non-medical: Not on file  Tobacco Use  . Smoking status: Former Smoker      Packs/day: 0.25    Years: 33.00    Pack years: 8.25    Types: Cigarettes  . Smokeless tobacco: Never Used  . Tobacco comment: 12/02/13- quit over 20 years ago/smoked 33 years on and off  Substance and Sexual Activity  . Alcohol use: No  . Drug use: No  . Sexual activity: Not Currently  Lifestyle  . Physical activity:    Days per week: Not on file    Minutes per session: Not on file  . Stress: Not on file  Relationships  . Social connections:    Talks on phone: Not on file    Gets together: Not on file    Attends religious service: Not on file    Active member of club or organization: Not on file    Attends meetings of clubs or organizations: Not on file    Relationship status: Not on file  . Intimate partner violence:    Fear of current or ex partner: Not on file    Emotionally abused: Not on file    Physically abused: Not on file    Forced sexual activity: Not on file  Other Topics Concern  . Not on file  Social History Narrative  . Not on file     Family History  Problem Relation Age of Onset  . Diabetes type II Mother   . Hypertension Father   . Diabetes type II Sister   . Ovarian cancer Sister   . Diabetes type II Other   . Stroke Neg Hx   . CAD Neg Hx   . Heart failure Neg Hx      Review of Systems: General: negative for chills, fever, night sweats or weight changes.  Cardiovascular: negative for chest pain, dyspnea on exertion, edema, orthopnea, palpitations, paroxysmal nocturnal dyspnea or shortness of breath Dermatological: negative for rash Respiratory: negative for cough or wheezing Urologic: negative for hematuria Abdominal: negative for nausea, vomiting, diarrhea, bright red blood per rectum, melena, or hematemesis Neurologic: negative for visual changes, syncope, or dizziness All other systems reviewed and are otherwise negative except as noted above.    Blood pressure 92/60, pulse 90, height 5\' 7"  (1.702 m), weight 172 lb (78 kg), SpO2 95 %.   General appearance: alert, cooperative, no distress and in wheel chair Neck: no JVD Lungs: decreased 1/2 on Lt Heart: regular rate and rhythm Extremities: 2+ bilate LE edema Skin: Skin color, texture, turgor normal. No rashes or lesions Neurologic:  Grossly normal   ASSESSMENT AND PLAN:   Acute on chronic combined systolic and diastolic CHF  Pt was not taking her Lasix correctly  CKD (chronic kidney disease) stage 4, GFR 15-29 ml/min (HCC) Followed by Dr Joelyn Oms  Nonischemic cardiomyopathy [facility]) infiltrative CM. EF 25% Dec 2018 with severe LVH Doubt she would be a candidate for Tafamidis but will review that with Dr Debara Pickett  Pleural effusion on left Being followed by Pulmonary service. The pt at this time declines consideration for thoracentesis    PLAN  Check CBC, BNP, BMP today (she doesn't see Dr Joelyn Oms until Jan-2020). Further recommendations based on her labs.   Kerin Ransom PA-C 06/13/2018 11:15 AM

## 2018-06-14 ENCOUNTER — Emergency Department (HOSPITAL_COMMUNITY): Payer: Medicare HMO

## 2018-06-14 ENCOUNTER — Other Ambulatory Visit: Payer: Self-pay

## 2018-06-14 ENCOUNTER — Inpatient Hospital Stay (HOSPITAL_COMMUNITY)
Admission: EM | Admit: 2018-06-14 | Discharge: 2018-06-20 | DRG: 291 | Disposition: E | Payer: Medicare HMO | Attending: Internal Medicine | Admitting: Internal Medicine

## 2018-06-14 ENCOUNTER — Encounter (HOSPITAL_COMMUNITY): Payer: Self-pay

## 2018-06-14 DIAGNOSIS — I2510 Atherosclerotic heart disease of native coronary artery without angina pectoris: Secondary | ICD-10-CM | POA: Diagnosis present

## 2018-06-14 DIAGNOSIS — N184 Chronic kidney disease, stage 4 (severe): Secondary | ICD-10-CM | POA: Diagnosis not present

## 2018-06-14 DIAGNOSIS — Z6828 Body mass index (BMI) 28.0-28.9, adult: Secondary | ICD-10-CM

## 2018-06-14 DIAGNOSIS — Z515 Encounter for palliative care: Secondary | ICD-10-CM

## 2018-06-14 DIAGNOSIS — R7989 Other specified abnormal findings of blood chemistry: Secondary | ICD-10-CM | POA: Diagnosis not present

## 2018-06-14 DIAGNOSIS — R748 Abnormal levels of other serum enzymes: Secondary | ICD-10-CM | POA: Diagnosis not present

## 2018-06-14 DIAGNOSIS — I959 Hypotension, unspecified: Secondary | ICD-10-CM | POA: Diagnosis present

## 2018-06-14 DIAGNOSIS — B9689 Other specified bacterial agents as the cause of diseases classified elsewhere: Secondary | ICD-10-CM | POA: Diagnosis present

## 2018-06-14 DIAGNOSIS — N390 Urinary tract infection, site not specified: Secondary | ICD-10-CM | POA: Diagnosis present

## 2018-06-14 DIAGNOSIS — Z79899 Other long term (current) drug therapy: Secondary | ICD-10-CM

## 2018-06-14 DIAGNOSIS — I428 Other cardiomyopathies: Secondary | ICD-10-CM

## 2018-06-14 DIAGNOSIS — K117 Disturbances of salivary secretion: Secondary | ICD-10-CM

## 2018-06-14 DIAGNOSIS — I43 Cardiomyopathy in diseases classified elsewhere: Secondary | ICD-10-CM | POA: Diagnosis not present

## 2018-06-14 DIAGNOSIS — R0902 Hypoxemia: Secondary | ICD-10-CM | POA: Diagnosis not present

## 2018-06-14 DIAGNOSIS — I5043 Acute on chronic combined systolic (congestive) and diastolic (congestive) heart failure: Secondary | ICD-10-CM | POA: Diagnosis not present

## 2018-06-14 DIAGNOSIS — E669 Obesity, unspecified: Secondary | ICD-10-CM | POA: Diagnosis present

## 2018-06-14 DIAGNOSIS — F411 Generalized anxiety disorder: Secondary | ICD-10-CM

## 2018-06-14 DIAGNOSIS — R600 Localized edema: Secondary | ICD-10-CM | POA: Diagnosis not present

## 2018-06-14 DIAGNOSIS — D631 Anemia in chronic kidney disease: Secondary | ICD-10-CM | POA: Diagnosis present

## 2018-06-14 DIAGNOSIS — Z90711 Acquired absence of uterus with remaining cervical stump: Secondary | ICD-10-CM

## 2018-06-14 DIAGNOSIS — R778 Other specified abnormalities of plasma proteins: Secondary | ICD-10-CM

## 2018-06-14 DIAGNOSIS — R0789 Other chest pain: Secondary | ICD-10-CM

## 2018-06-14 DIAGNOSIS — Z8249 Family history of ischemic heart disease and other diseases of the circulatory system: Secondary | ICD-10-CM

## 2018-06-14 DIAGNOSIS — Z9049 Acquired absence of other specified parts of digestive tract: Secondary | ICD-10-CM

## 2018-06-14 DIAGNOSIS — Z981 Arthrodesis status: Secondary | ICD-10-CM

## 2018-06-14 DIAGNOSIS — R69 Illness, unspecified: Secondary | ICD-10-CM | POA: Diagnosis not present

## 2018-06-14 DIAGNOSIS — E871 Hypo-osmolality and hyponatremia: Secondary | ICD-10-CM | POA: Diagnosis not present

## 2018-06-14 DIAGNOSIS — R52 Pain, unspecified: Secondary | ICD-10-CM

## 2018-06-14 DIAGNOSIS — K219 Gastro-esophageal reflux disease without esophagitis: Secondary | ICD-10-CM | POA: Diagnosis present

## 2018-06-14 DIAGNOSIS — E854 Organ-limited amyloidosis: Secondary | ICD-10-CM

## 2018-06-14 DIAGNOSIS — M1990 Unspecified osteoarthritis, unspecified site: Secondary | ICD-10-CM | POA: Diagnosis present

## 2018-06-14 DIAGNOSIS — E785 Hyperlipidemia, unspecified: Secondary | ICD-10-CM | POA: Diagnosis present

## 2018-06-14 DIAGNOSIS — R680 Hypothermia, not associated with low environmental temperature: Secondary | ICD-10-CM | POA: Diagnosis not present

## 2018-06-14 DIAGNOSIS — Z882 Allergy status to sulfonamides status: Secondary | ICD-10-CM

## 2018-06-14 DIAGNOSIS — Z7982 Long term (current) use of aspirin: Secondary | ICD-10-CM

## 2018-06-14 DIAGNOSIS — R079 Chest pain, unspecified: Secondary | ICD-10-CM | POA: Diagnosis not present

## 2018-06-14 DIAGNOSIS — Z87891 Personal history of nicotine dependence: Secondary | ICD-10-CM

## 2018-06-14 DIAGNOSIS — D469 Myelodysplastic syndrome, unspecified: Secondary | ICD-10-CM | POA: Diagnosis present

## 2018-06-14 DIAGNOSIS — I493 Ventricular premature depolarization: Secondary | ICD-10-CM | POA: Diagnosis not present

## 2018-06-14 DIAGNOSIS — Z66 Do not resuscitate: Secondary | ICD-10-CM | POA: Diagnosis not present

## 2018-06-14 DIAGNOSIS — J9600 Acute respiratory failure, unspecified whether with hypoxia or hypercapnia: Secondary | ICD-10-CM | POA: Diagnosis present

## 2018-06-14 DIAGNOSIS — D890 Polyclonal hypergammaglobulinemia: Secondary | ICD-10-CM | POA: Diagnosis present

## 2018-06-14 DIAGNOSIS — I313 Pericardial effusion (noninflammatory): Secondary | ICD-10-CM | POA: Diagnosis present

## 2018-06-14 DIAGNOSIS — J9811 Atelectasis: Secondary | ICD-10-CM | POA: Diagnosis present

## 2018-06-14 DIAGNOSIS — I420 Dilated cardiomyopathy: Secondary | ICD-10-CM | POA: Diagnosis not present

## 2018-06-14 DIAGNOSIS — I130 Hypertensive heart and chronic kidney disease with heart failure and stage 1 through stage 4 chronic kidney disease, or unspecified chronic kidney disease: Principal | ICD-10-CM | POA: Diagnosis present

## 2018-06-14 DIAGNOSIS — R0602 Shortness of breath: Secondary | ICD-10-CM | POA: Diagnosis not present

## 2018-06-14 DIAGNOSIS — Z833 Family history of diabetes mellitus: Secondary | ICD-10-CM

## 2018-06-14 DIAGNOSIS — G629 Polyneuropathy, unspecified: Secondary | ICD-10-CM | POA: Diagnosis present

## 2018-06-14 DIAGNOSIS — I480 Paroxysmal atrial fibrillation: Secondary | ICD-10-CM | POA: Diagnosis present

## 2018-06-14 DIAGNOSIS — I4891 Unspecified atrial fibrillation: Secondary | ICD-10-CM | POA: Diagnosis present

## 2018-06-14 DIAGNOSIS — E1122 Type 2 diabetes mellitus with diabetic chronic kidney disease: Secondary | ICD-10-CM | POA: Diagnosis present

## 2018-06-14 DIAGNOSIS — Z8041 Family history of malignant neoplasm of ovary: Secondary | ICD-10-CM

## 2018-06-14 LAB — CBC
HEMATOCRIT: 36.1 % (ref 36.0–46.0)
Hematocrit: 34.8 % (ref 34.0–46.6)
Hemoglobin: 11.3 g/dL (ref 11.1–15.9)
Hemoglobin: 12.4 g/dL (ref 12.0–15.0)
MCH: 30.6 pg (ref 26.6–33.0)
MCH: 31.3 pg (ref 26.0–34.0)
MCHC: 32.5 g/dL (ref 31.5–35.7)
MCHC: 34.3 g/dL (ref 30.0–36.0)
MCV: 94 fL (ref 79–97)
MCV: 91.2 fL (ref 78.0–100.0)
Platelets: 160 10*3/uL (ref 150–450)
Platelets: 162 10*3/uL (ref 150–400)
RBC: 3.69 x10E6/uL — ABNORMAL LOW (ref 3.77–5.28)
RBC: 3.96 MIL/uL (ref 3.87–5.11)
RDW: 16.8 % — ABNORMAL HIGH (ref 12.3–15.4)
RDW: 15.9 % — AB (ref 11.5–15.5)
WBC: 6.6 10*3/uL (ref 3.4–10.8)
WBC: 6.3 10*3/uL (ref 4.0–10.5)

## 2018-06-14 LAB — I-STAT TROPONIN, ED: Troponin i, poc: 0.34 ng/mL (ref 0.00–0.08)

## 2018-06-14 LAB — BASIC METABOLIC PANEL
BUN/Creatinine Ratio: 38 — ABNORMAL HIGH (ref 12–28)
BUN: 95 mg/dL (ref 8–27)
CO2: 22 mmol/L (ref 20–29)
Calcium: 9.8 mg/dL (ref 8.7–10.3)
Chloride: 91 mmol/L — ABNORMAL LOW (ref 96–106)
Creatinine, Ser: 2.48 mg/dL — ABNORMAL HIGH (ref 0.57–1.00)
GFR calc Af Amer: 21 mL/min/{1.73_m2} — ABNORMAL LOW (ref >59)
GFR calc non Af Amer: 18 mL/min/{1.73_m2} — ABNORMAL LOW (ref >59)
Glucose: 122 mg/dL — ABNORMAL HIGH (ref 65–99)
Potassium: 4.4 mmol/L (ref 3.5–5.2)
Sodium: 133 mmol/L — ABNORMAL LOW (ref 134–144)

## 2018-06-14 LAB — PRO B NATRIURETIC PEPTIDE: NT-Pro BNP: 23467 pg/mL — ABNORMAL HIGH (ref 0–738)

## 2018-06-14 LAB — BRAIN NATRIURETIC PEPTIDE: B Natriuretic Peptide: 660.1 pg/mL — ABNORMAL HIGH (ref 0.0–100.0)

## 2018-06-14 MED ORDER — SODIUM CHLORIDE 0.9 % IV SOLN
INTRAVENOUS | Status: DC
  Administered 2018-06-14: 22:00:00 via INTRAVENOUS

## 2018-06-14 MED ORDER — FUROSEMIDE 10 MG/ML IJ SOLN
120.0000 mg | Freq: Once | INTRAVENOUS | Status: AC
  Administered 2018-06-15: 120 mg via INTRAVENOUS
  Filled 2018-06-14: qty 12

## 2018-06-14 NOTE — ED Notes (Signed)
Dr Ashok Cordia informed of troponin results .[place]

## 2018-06-14 NOTE — ED Notes (Signed)
Pt's Mandy Rhodes Ph# [phone]

## 2018-06-14 NOTE — Discharge Instructions (Addendum)
It was our pleasure to provide your ER care today - we hope that you feel better.  Your lab tests appear very similar today to your baseline. Your chest xray shows a persistent left effusion - discuss with your doctor possible outpatient pleurocentesis.  Elevate your legs as much as possible to help the swelling. Limit salt intake. Continue your diuretic medication.  Follow up closely with your doctor/cardiologist in the coming week - call office Monday to arrange appointment.   Return to ER if worse, new symptoms, fevers, increased difficulty breathing, other concern.    --------------------------------------------------------------------------------------------------------------- Information on my medicine - ELIQUIS (apixaban)  This medication education was reviewed with me or my healthcare representative as part of my discharge preparation.    Why was Eliquis prescribed for you? Eliquis was prescribed for you to reduce the risk of a blood clot forming that can cause a stroke if you have a medical condition called atrial fibrillation (a type of irregular heartbeat).  What do You need to know about Eliquis ? Take your Eliquis TWICE DAILY - one tablet in the morning and one tablet in the evening with or without food. If you have difficulty swallowing the tablet whole please discuss with your pharmacist how to take the medication safely.  Take Eliquis exactly as prescribed by your doctor and DO NOT stop taking Eliquis without talking to the doctor who prescribed the medication.  Stopping may increase your risk of developing a stroke.  Refill your prescription before you run out.  After discharge, you should have regular check-up appointments with your healthcare provider that is prescribing your Eliquis.  In the future your dose may need to be changed if your kidney function or weight changes by a significant amount or as you get older.  What do you do if you miss a dose? If you  miss a dose, take it as soon as you remember on the same day and resume taking twice daily.  Do not take more than one dose of ELIQUIS at the same time to make up a missed dose.  Important Safety Information A possible side effect of Eliquis is bleeding. You should call your healthcare provider right away if you experience any of the following: ? Bleeding from an injury or your nose that does not stop. ? Unusual colored urine (red or dark brown) or unusual colored stools (red or black). ? Unusual bruising for unknown reasons. ? A serious fall or if you hit your head (even if there is no bleeding).  Some medicines may interact with Eliquis and might increase your risk of bleeding or clotting while on Eliquis. To help avoid this, consult your healthcare provider or pharmacist prior to using any new prescription or non-prescription medications, including herbals, vitamins, non-steroidal anti-inflammatory drugs (NSAIDs) and supplements.  This website has more information on Eliquis (apixaban): http://www.eliquis.com/eliquis/home

## 2018-06-14 NOTE — ED Triage Notes (Signed)
Pt BIB [facility] d/t nonradiating Gen. CP that started at 2000 while eating. Pt was given 324 ASA. Pt has Hx of CHF

## 2018-06-14 NOTE — ED Provider Notes (Addendum)
[facility] EMERGENCY DEPARTMENT Provider Note   CSN: [id number] Arrival date & time: 06/10/2018  2058     History   Chief Complaint Chief Complaint  Patient presents with  . Chest Pain    HPI Mandy Rhodes is a 77 y.o. female.  Patient c/o mid chest discomfort in the past couple days, along with sob, and increased bilateral leg edema. States legs are chronically swollen, but worse in past few days. Chest pain constant, dull, at rest, moderate, non radiating. No diaphoresis or nv. Denies pleuritic pain. No cough or uri symptoms. No fever or chills. Pt notes her baseline bp is in 90/50 range. Pt has hx non-ischemic CM, cath 2017 w minimal cad.   The history is provided by the patient and the EMS personnel.  Chest Pain   Pertinent negatives include no abdominal pain, no back pain, no fever, no headaches, no shortness of breath and no vomiting.    Past Medical History:  Diagnosis Date  . Anemia   . Arthritis   . CAD (coronary artery disease)    a. minimal CAD by cath in 03/2016  . CHF (congestive heart failure) ([place])    a. 09/2016: Echo with EF of 30-35%, moderate diffuse HK, Grade 2 DD, PA peak pressure of 39 mm Hg.  Marland Kitchen Chronic kidney disease    Renal Insuffiency, see [facility] once a year  . Diabetes mellitus    type 2  . GERD (gastroesophageal reflux disease)   . History of rotator cuff tear    Torn right rotator cuff.  . History of spinal stenosis   . Hypertension   . Infiltrative cardiomyopathy ([place])    a, presumed cardiac amyloidosis by MRI with prior nondiagnostic fat pad biopsy  . Neuropathy associated with endocrine disorder [facility])     Patient Active Problem List   Diagnosis Date Noted  . Pleural effusion on left 06/06/2018  . Thrombocytopenia ([place]) 11/06/2017  . Prolonged Q-T interval on ECG 11/06/2017  . Combined systolic and diastolic congestive heart failure ([place]) 11/05/2017  . Leukopenia 05/17/2017  . Chronic knee pain  03/13/2017  . Mental status change resolved 11/24/2016  . UTI due to extended-spectrum beta lactamase (ESBL) producing Escherichia coli 11/22/2016  . Dysphagia 11/01/2016  . Vertebral osteomyelitis ([place])   . Laryngeal mass 10/16/2016  . Anemia 10/16/2016  . Demand ischemia ([place]) 10/16/2016  . PAT (paroxysmal atrial tachycardia) ([place]) 10/14/2016  . Malnutrition of moderate degree 10/14/2016  . Acute respiratory failure with hypoxia ([place]) 10/13/2016  . Pressure injury of skin 10/13/2016  . Acute kidney injury superimposed on chronic kidney disease ([place])   . Acute on chronic combined systolic and diastolic CHF    . Edema 08/23/2016  . Nonischemic cardiomyopathy ([place]) 07/14/2016  . Hyperlipidemia 07/14/2016  . CKD (chronic kidney disease) stage 4, GFR 15-29 ml/min (HCC) 03/17/2016  . Essential hypertension 03/17/2016  . Diabetes mellitus ([place]) 09/29/2011    Past Surgical History:  Procedure Laterality Date  . ABDOMINAL HYSTERECTOMY  1971   Partial  . ANTERIOR CERVICAL DECOMP/DISCECTOMY FUSION N/A 10/18/2016   Procedure: debridement and drainage cervical prevertebral abscess  ANTERIOR CERVICAL HARDWARE REMOVAL exploration cervical fusion;  Surgeon: Earnie Larsson, MD;  Location: [place];  Service: Neurosurgery;  Laterality: N/A;  . APPENDECTOMY    . BACK SURGERY  2009   Disectomy  . CARDIAC CATHETERIZATION N/A 03/22/2016   Procedure: Left Heart Cath and Coronary Angiography;  Surgeon: Peter M Martinique, MD;  Location: [facility]  INVASIVE CV LAB;  Service: Cardiovascular;  Laterality: N/A;  . CHOLECYSTECTOMY  1969   Status post   . CYST REMOVAL TRUNK Right 12/03/2014   Procedure: EXCISION OF RIGHT BACK CYST ;  Surgeon: Coralie Keens, MD;  Location: [place];  Service: General;  Laterality: Right;  . DIRECT LARYNGOSCOPY N/A 10/17/2016   Procedure: DIRECT LARYNGOSCOPY,  ESOPHAGOSCOPY;  Surgeon: [name], MD;  [facility];  Service: ENT;  Laterality: N/A;  . FOOT SURGERY Bilateral    bone  removed  . IR GASTROSTOMY TUBE REMOVAL  05/03/2017  . IR GENERIC HISTORICAL  10/25/2016   IR GASTROSTOMY TUBE MOD SED 10/25/2016 [name], MD [facility]-INTERV RAD  . NEUROPLASTY / TRANSPOSITION MEDIAN NERVE AT CARPAL TUNNEL Bilateral    Hx  Left carpal tunnel repair  . TRACHEOSTOMY TUBE PLACEMENT N/A 10/17/2016   Procedure: TRACHEOSTOMY;  Surgeon: [name], MD;  [facility];  Service: ENT;  Laterality: N/A;     OB History   None      Home Medications    Prior to Admission medications   Medication Sig Start Date End Date Taking? Authorizing Provider  acetaminophen (TYLENOL) 325 MG tablet Take 650 mg by mouth every 6 (six) hours as needed for mild pain.    [provider]  aspirin 81 MG EC tablet Chew 81 mg by mouth daily.     [provider]  Calcium Carb-Cholecalciferol (CALCIUM 600 + D PO) Take 1 tablet by mouth every morning.     [provider]  docusate sodium (COLACE) 100 MG capsule Take 100 mg by mouth daily.    [provider]  ferrous sulfate 325 (65 FE) MG EC tablet Take 325 mg by mouth daily.     [provider]  furosemide (LASIX) 80 MG tablet Take 160 mg by mouth 2 (two) times daily.    [provider]  hydroxypropyl methylcellulose / hypromellose (ISOPTO TEARS / GONIOVISC) 2.5 % ophthalmic solution Place 1 drop into both eyes as needed for dry eyes.    [provider]  metolazone (ZAROXOLYN) 5 MG tablet ONE TIME DOSE TODAY before evening dose of Lasix Patient not taking: Reported on 06/13/2018 06/06/18   Erlene Quan, PA-C  metoprolol succinate (TOPROL-XL) 25 MG 24 hr tablet Take 12.5 mg by mouth daily.    [provider]  metoprolol succinate (TOPROL-XL) 25 MG 24 hr tablet Take 0.5 tablets (12.5 mg total) by mouth daily. 06/07/18   Hilty, Nadean Corwin, MD  Multiple Vitamin (TAB-A-VITE) TABS Take 1 tablet by mouth every morning.     [provider]  omeprazole (PRILOSEC) 40 MG capsule  Take 1 capsule (40 mg total) by mouth daily. 11/11/17   Hongalgi, Lenis Dickinson, MD  potassium chloride 20 MEQ TBCR Take 20 mEq by mouth daily. 11/11/17   Hongalgi, Lenis Dickinson, MD  trolamine salicylate (ASPERCREME) 10 % cream Apply 1 application topically as needed for muscle pain.    [provider]    Family History Family History  Problem Relation Age of Onset  . Diabetes type II Mother   . Hypertension Father   . Diabetes type II Sister   . Ovarian cancer Sister   . Diabetes type II Other   . Stroke Neg Hx   . CAD Neg Hx   . Heart failure Neg Hx     Social History Social History   Tobacco Use  . Smoking status: Former Smoker    Packs/day: 0.25  Years: 33.00    Pack years: 8.25    Types: Cigarettes  . Smokeless tobacco: Never Used  . Tobacco comment: 12/02/13- quit over 20 years ago/smoked 33 years on and off  Substance Use Topics  . Alcohol use: No  . Drug use: No     Allergies   Sulfa antibiotics   Review of Systems Review of Systems  Constitutional: Negative for chills and fever.  HENT: Negative for sore throat.   Eyes: Negative for redness.  Respiratory: Negative for shortness of breath.   Cardiovascular: Positive for chest pain and leg swelling.  Gastrointestinal: Negative for abdominal pain and vomiting.  Genitourinary: Negative for dysuria and flank pain.  Musculoskeletal: Negative for back pain and neck pain.  Skin: Negative for rash.  Neurological: Negative for headaches.  Hematological: Does not bruise/bleed easily.  Psychiatric/Behavioral: Negative for confusion.     Physical Exam Updated Vital Signs Ht 1.702 m (5\' 7" )   Wt 77.6 kg (171 lb)   SpO2 98%   BMI 26.78 kg/m   Physical Exam  Constitutional: She appears well-developed and well-nourished.  HENT:  Mouth/Throat: Oropharynx is clear and moist.  Eyes: Conjunctivae are normal. No scleral icterus.  Neck: Neck supple. No tracheal deviation present.  Cardiovascular: Normal rate,  regular rhythm, normal heart sounds and intact distal pulses. Exam reveals no gallop and no friction rub.  No murmur heard. Pulmonary/Chest: Effort normal and breath sounds normal. No respiratory distress.  Abdominal: Soft. Normal appearance and bowel sounds are normal. She exhibits no distension. There is no tenderness.  Genitourinary:  Genitourinary Comments: No cva tenderness  Musculoskeletal: She exhibits edema.  3+ bil lower leg edema.   Neurological: She is alert.  Skin: Skin is warm and dry. No rash noted. She is not diaphoretic.  Psychiatric: She has a normal mood and affect.  Nursing note and vitals reviewed.    ED Treatments / Results  Labs (all labs ordered are listed, but only abnormal results are displayed) Results for orders placed or performed during the hospital encounter of 06/06/2018  CBC  Result Value Ref Range   WBC 6.3 4.0 - 10.5 K/uL   RBC 3.96 3.87 - 5.11 MIL/uL   Hemoglobin 12.4 12.0 - 15.0 g/dL   HCT 36.1 36.0 - 46.0 %   MCV 91.2 78.0 - 100.0 fL   MCH 31.3 26.0 - 34.0 pg   MCHC 34.3 30.0 - 36.0 g/dL   RDW 15.9 (H) 11.5 - 15.5 %   Platelets 162 150 - 400 K/uL  I-stat troponin, ED  Result Value Ref Range   Troponin i, poc 0.34 (HH) 0.00 - 0.08 ng/mL   Comment NOTIFIED PHYSICIAN    Comment 3           Dg Chest 2 View  Result Date: 05/26/2018 CLINICAL DATA:  Chest pain EXAM: CHEST - 2 VIEW COMPARISON:  Apr 16, 2018 FINDINGS: There is persistent left lower lobe consolidation with left pleural effusion. Right lung is clear. There is cardiomegaly with pulmonary vascularity within normal limits. No adenopathy. Bones are osteoporotic. IMPRESSION: Persistent left pleural effusion with left lower lobe consolidation. Lungs elsewhere clear. Stable cardiomegaly. Bones osteoporotic. Electronically Signed   By: Lowella Grip III M.D.   On: 05/26/2018 09:29   Dg Chest Port 1 View  Result Date: 06/05/2018 CLINICAL DATA:  Generalized chest pain started at 2000 hours  while eating. History of congestive heart failure, coronary artery disease, hypertension EXAM: PORTABLE CHEST 1 VIEW COMPARISON:  05/26/2018 FINDINGS:  Prominent cardiac enlargement. Probable left pleural effusion with left basilar atelectasis or consolidation. Right lung appears clear. No significant change since previous study. Calcified and tortuous aorta. No pneumothorax. Degenerative changes in the spine and shoulders. IMPRESSION: Prominent cardiac enlargement. Left pleural effusion with left basilar atelectasis or consolidation. No change. Electronically Signed   By: Lucienne Capers M.D.   On: 06/02/2018 21:41    EKG EKG Interpretation  Date/Time:  Friday June 14 2018 21:07:27 EDT Ventricular Rate:  65 PR Interval:    QRS Duration: 128 QT Interval:  459 QTC Calculation: 478 R Axis:   85 Text Interpretation:  Sinus rhythm Atrial premature complex Non-specific intra-ventricular conduction delay Non-specific ST-t changes Baseline wander No significant change since last tracing Confirmed by Lajean Saver [id number]) on 05/30/2018 9:26:37 PM   Radiology Dg Chest Port 1 View  Result Date: 05/28/2018 CLINICAL DATA:  Generalized chest pain started at 2000 hours while eating. History of congestive heart failure, coronary artery disease, hypertension EXAM: PORTABLE CHEST 1 VIEW COMPARISON:  05/26/2018 FINDINGS: Prominent cardiac enlargement. Probable left pleural effusion with left basilar atelectasis or consolidation. Right lung appears clear. No significant change since previous study. Calcified and tortuous aorta. No pneumothorax. Degenerative changes in the spine and shoulders. IMPRESSION: Prominent cardiac enlargement. Left pleural effusion with left basilar atelectasis or consolidation. No change. Electronically Signed   By: Lucienne Capers M.D.   On: 05/23/2018 21:41    Procedures Procedures (including critical care time)  Medications Ordered in ED Medications - No data to  display   Initial Impression / Assessment and Plan / ED Course  I have reviewed the triage vital signs and the nursing notes.  Pertinent labs & imaging results that were available during my care of the patient were reviewed by me and considered in my medical decision making (see chart for details).  Iv ns. Continuous pulse ox and monitor. Ecg. Cxr. Labs.   Reviewed nursing notes and prior charts for additional history. Reviewed prior cardiac cath 2017 - minimal cad. Hx nicm - ef 25%. Is on high dose lasix at baseline, 160 mg bid.  Pt evaluated by cardiology yesterday - felt stable then for continued outpt tx/f/u.   Labs reviewed - trop is mildly elevated c/w prior chronically elevated trop. Hx nicm.   cxr reviewed - no pna, chr left effusion (pt has declined thoracentesis in past/currently).  Await additional labs.  pts baseline bp appears at/around 90.  Recheck bp 96/60, c/w pts baseline.   No chest pain currently. Awaiting labs/chemistries/bnp.Pt signed out to Dr Randal Buba.        Final Clinical Impressions(s) / ED Diagnoses   Final diagnoses:  None    ED Discharge Orders    None           Lajean Saver, MD 06/15/18 1512

## 2018-06-15 ENCOUNTER — Encounter (HOSPITAL_COMMUNITY): Payer: Self-pay | Admitting: Family Medicine

## 2018-06-15 ENCOUNTER — Inpatient Hospital Stay (HOSPITAL_COMMUNITY): Payer: Medicare HMO

## 2018-06-15 DIAGNOSIS — K219 Gastro-esophageal reflux disease without esophagitis: Secondary | ICD-10-CM | POA: Diagnosis present

## 2018-06-15 DIAGNOSIS — I361 Nonrheumatic tricuspid (valve) insufficiency: Secondary | ICD-10-CM | POA: Diagnosis not present

## 2018-06-15 DIAGNOSIS — J9600 Acute respiratory failure, unspecified whether with hypoxia or hypercapnia: Secondary | ICD-10-CM | POA: Diagnosis not present

## 2018-06-15 DIAGNOSIS — F411 Generalized anxiety disorder: Secondary | ICD-10-CM | POA: Diagnosis present

## 2018-06-15 DIAGNOSIS — K117 Disturbances of salivary secretion: Secondary | ICD-10-CM | POA: Diagnosis not present

## 2018-06-15 DIAGNOSIS — I43 Cardiomyopathy in diseases classified elsewhere: Secondary | ICD-10-CM | POA: Diagnosis not present

## 2018-06-15 DIAGNOSIS — Z66 Do not resuscitate: Secondary | ICD-10-CM | POA: Diagnosis not present

## 2018-06-15 DIAGNOSIS — I5043 Acute on chronic combined systolic (congestive) and diastolic (congestive) heart failure: Secondary | ICD-10-CM

## 2018-06-15 DIAGNOSIS — R0789 Other chest pain: Secondary | ICD-10-CM | POA: Diagnosis present

## 2018-06-15 DIAGNOSIS — M1990 Unspecified osteoarthritis, unspecified site: Secondary | ICD-10-CM | POA: Diagnosis present

## 2018-06-15 DIAGNOSIS — N390 Urinary tract infection, site not specified: Secondary | ICD-10-CM | POA: Diagnosis not present

## 2018-06-15 DIAGNOSIS — Z90711 Acquired absence of uterus with remaining cervical stump: Secondary | ICD-10-CM | POA: Diagnosis not present

## 2018-06-15 DIAGNOSIS — I2510 Atherosclerotic heart disease of native coronary artery without angina pectoris: Secondary | ICD-10-CM | POA: Diagnosis present

## 2018-06-15 DIAGNOSIS — E854 Organ-limited amyloidosis: Secondary | ICD-10-CM | POA: Diagnosis not present

## 2018-06-15 DIAGNOSIS — J9811 Atelectasis: Secondary | ICD-10-CM | POA: Diagnosis not present

## 2018-06-15 DIAGNOSIS — N184 Chronic kidney disease, stage 4 (severe): Secondary | ICD-10-CM

## 2018-06-15 DIAGNOSIS — I4891 Unspecified atrial fibrillation: Secondary | ICD-10-CM | POA: Diagnosis present

## 2018-06-15 DIAGNOSIS — I959 Hypotension, unspecified: Secondary | ICD-10-CM | POA: Diagnosis present

## 2018-06-15 DIAGNOSIS — I480 Paroxysmal atrial fibrillation: Secondary | ICD-10-CM

## 2018-06-15 DIAGNOSIS — R69 Illness, unspecified: Secondary | ICD-10-CM | POA: Diagnosis not present

## 2018-06-15 DIAGNOSIS — G629 Polyneuropathy, unspecified: Secondary | ICD-10-CM | POA: Diagnosis present

## 2018-06-15 DIAGNOSIS — I313 Pericardial effusion (noninflammatory): Secondary | ICD-10-CM | POA: Diagnosis not present

## 2018-06-15 DIAGNOSIS — I130 Hypertensive heart and chronic kidney disease with heart failure and stage 1 through stage 4 chronic kidney disease, or unspecified chronic kidney disease: Secondary | ICD-10-CM | POA: Diagnosis not present

## 2018-06-15 DIAGNOSIS — R748 Abnormal levels of other serum enzymes: Secondary | ICD-10-CM

## 2018-06-15 DIAGNOSIS — R079 Chest pain, unspecified: Secondary | ICD-10-CM | POA: Insufficient documentation

## 2018-06-15 DIAGNOSIS — B9689 Other specified bacterial agents as the cause of diseases classified elsewhere: Secondary | ICD-10-CM | POA: Diagnosis present

## 2018-06-15 DIAGNOSIS — Z515 Encounter for palliative care: Secondary | ICD-10-CM | POA: Diagnosis not present

## 2018-06-15 DIAGNOSIS — E871 Hypo-osmolality and hyponatremia: Secondary | ICD-10-CM | POA: Diagnosis not present

## 2018-06-15 DIAGNOSIS — R680 Hypothermia, not associated with low environmental temperature: Secondary | ICD-10-CM | POA: Diagnosis not present

## 2018-06-15 DIAGNOSIS — R52 Pain, unspecified: Secondary | ICD-10-CM | POA: Diagnosis not present

## 2018-06-15 DIAGNOSIS — R7989 Other specified abnormal findings of blood chemistry: Secondary | ICD-10-CM | POA: Diagnosis present

## 2018-06-15 DIAGNOSIS — E1122 Type 2 diabetes mellitus with diabetic chronic kidney disease: Secondary | ICD-10-CM | POA: Diagnosis present

## 2018-06-15 LAB — COMPREHENSIVE METABOLIC PANEL
ALBUMIN: 3.1 g/dL — AB (ref 3.5–5.0)
ALT: 20 U/L (ref 0–44)
ANION GAP: 15 (ref 5–15)
AST: 19 U/L (ref 15–41)
Alkaline Phosphatase: 134 U/L — ABNORMAL HIGH (ref 38–126)
BUN: 109 mg/dL — ABNORMAL HIGH (ref 8–23)
CO2: 21 mmol/L — AB (ref 22–32)
Calcium: 9.7 mg/dL (ref 8.9–10.3)
Chloride: 93 mmol/L — ABNORMAL LOW (ref 98–111)
Creatinine, Ser: 2.62 mg/dL — ABNORMAL HIGH (ref 0.44–1.00)
GFR calc Af Amer: 19 mL/min — ABNORMAL LOW (ref >60)
GFR calc non Af Amer: 17 mL/min — ABNORMAL LOW (ref >60)
Glucose, Bld: 134 mg/dL — ABNORMAL HIGH (ref 70–99)
POTASSIUM: 4.4 mmol/L (ref 3.5–5.1)
SODIUM: 129 mmol/L — AB (ref 135–145)
Total Bilirubin: 1.5 mg/dL — ABNORMAL HIGH (ref 0.3–1.2)
Total Protein: 7.2 g/dL (ref 6.5–8.1)

## 2018-06-15 LAB — MRSA PCR SCREENING: MRSA by PCR: NEGATIVE

## 2018-06-15 LAB — URINALYSIS, ROUTINE W REFLEX MICROSCOPIC
Bilirubin Urine: NEGATIVE
Glucose, UA: NEGATIVE mg/dL
Ketones, ur: NEGATIVE mg/dL
Nitrite: NEGATIVE
Protein, ur: NEGATIVE mg/dL
Specific Gravity, Urine: 1.011 (ref 1.005–1.030)
pH: 5 (ref 5.0–8.0)

## 2018-06-15 LAB — TROPONIN I: TROPONIN I: 0.27 ng/mL — AB (ref <0.03)

## 2018-06-15 LAB — GLUCOSE, CAPILLARY: GLUCOSE-CAPILLARY: 134 mg/dL — AB (ref 70–99)

## 2018-06-15 MED ORDER — SODIUM CHLORIDE 0.9% FLUSH
3.0000 mL | INTRAVENOUS | Status: DC | PRN
Start: 2018-06-15 — End: 2018-06-19

## 2018-06-15 MED ORDER — FUROSEMIDE 10 MG/ML IJ SOLN
80.0000 mg | Freq: Two times a day (BID) | INTRAMUSCULAR | Status: DC
  Administered 2018-06-15 – 2018-06-19 (×8): 80 mg via INTRAVENOUS
  Filled 2018-06-15 (×8): qty 8

## 2018-06-15 MED ORDER — HEPARIN SODIUM (PORCINE) 5000 UNIT/ML IJ SOLN
5000.0000 [IU] | Freq: Three times a day (TID) | INTRAMUSCULAR | Status: DC
  Administered 2018-06-15 (×2): 5000 [IU] via SUBCUTANEOUS
  Filled 2018-06-15 (×2): qty 1

## 2018-06-15 MED ORDER — METOPROLOL SUCCINATE ER 25 MG PO TB24
12.5000 mg | ORAL_TABLET | Freq: Every day | ORAL | Status: DC
  Administered 2018-06-15: 12.5 mg via ORAL
  Filled 2018-06-15: qty 1

## 2018-06-15 MED ORDER — SODIUM CHLORIDE 0.9 % IV SOLN
250.0000 mL | INTRAVENOUS | Status: DC | PRN
  Administered 2018-06-17 – 2018-06-18 (×2): 250 mL via INTRAVENOUS

## 2018-06-15 MED ORDER — ACETAMINOPHEN 325 MG PO TABS
650.0000 mg | ORAL_TABLET | ORAL | Status: DC | PRN
  Administered 2018-06-15: 650 mg via ORAL
  Filled 2018-06-15: qty 2

## 2018-06-15 MED ORDER — INSULIN ASPART 100 UNIT/ML ~~LOC~~ SOLN: 0.0000 [IU] | Freq: Every day | SUBCUTANEOUS | Status: DC

## 2018-06-15 MED ORDER — PANTOPRAZOLE SODIUM 40 MG PO TBEC
40.0000 mg | DELAYED_RELEASE_TABLET | Freq: Every day | ORAL | Status: DC
  Administered 2018-06-15 – 2018-06-18 (×4): 40 mg via ORAL
  Filled 2018-06-15 (×4): qty 1

## 2018-06-15 MED ORDER — INSULIN ASPART 100 UNIT/ML ~~LOC~~ SOLN
0.0000 [IU] | Freq: Three times a day (TID) | SUBCUTANEOUS | Status: DC
  Administered 2018-06-16 – 2018-06-17 (×4): 1 [IU] via SUBCUTANEOUS

## 2018-06-15 MED ORDER — MIDODRINE HCL 5 MG PO TABS
10.0000 mg | ORAL_TABLET | Freq: Three times a day (TID) | ORAL | Status: DC
Start: 2018-06-15 — End: 2018-06-18
  Administered 2018-06-15 – 2018-06-18 (×9): 10 mg via ORAL
  Filled 2018-06-15 (×9): qty 2

## 2018-06-15 MED ORDER — ADULT MULTIVITAMIN W/MINERALS CH
1.0000 | ORAL_TABLET | Freq: Every day | ORAL | Status: DC
  Administered 2018-06-15 – 2018-06-17 (×3): 1 via ORAL
  Filled 2018-06-15 (×4): qty 1

## 2018-06-15 MED ORDER — ONDANSETRON HCL 4 MG/2ML IJ SOLN
4.0000 mg | Freq: Four times a day (QID) | INTRAMUSCULAR | Status: DC | PRN
Start: 2018-06-15 — End: 2018-06-19
  Administered 2018-06-17: 4 mg via INTRAVENOUS
  Filled 2018-06-15: qty 2

## 2018-06-15 MED ORDER — ASPIRIN EC 81 MG PO TBEC
81.0000 mg | DELAYED_RELEASE_TABLET | Freq: Every day | ORAL | Status: DC
  Administered 2018-06-15: 81 mg via ORAL
  Filled 2018-06-15: qty 1

## 2018-06-15 MED ORDER — DIPHENHYDRAMINE HCL 12.5 MG/5ML PO ELIX
12.5000 mg | ORAL_SOLUTION | Freq: Every day | ORAL | Status: DC | PRN
  Administered 2018-06-15 – 2018-06-17 (×2): 12.5 mg via ORAL
  Filled 2018-06-15 (×2): qty 10

## 2018-06-15 MED ORDER — SODIUM CHLORIDE 0.9% FLUSH
3.0000 mL | Freq: Two times a day (BID) | INTRAVENOUS | Status: DC
  Administered 2018-06-15 – 2018-06-18 (×9): 3 mL via INTRAVENOUS

## 2018-06-15 MED ORDER — ALUM & MAG HYDROXIDE-SIMETH 200-200-20 MG/5ML PO SUSP
15.0000 mL | Freq: Four times a day (QID) | ORAL | Status: DC | PRN
  Administered 2018-06-15 – 2018-06-17 (×3): 15 mL via ORAL
  Filled 2018-06-15 (×3): qty 30

## 2018-06-15 MED ORDER — APIXABAN 5 MG PO TABS
5.0000 mg | ORAL_TABLET | Freq: Two times a day (BID) | ORAL | Status: DC
  Administered 2018-06-15 – 2018-06-18 (×6): 5 mg via ORAL
  Filled 2018-06-15 (×6): qty 1

## 2018-06-15 NOTE — H&P (Signed)
History and Physical    Mandy Rhodes:[id number] DOB: 1941-05-07 DOA: 06/13/2018  PCP: Harlan Stains, MD   Patient coming from: Home  Chief Complaint: SOB, increased bilateral leg swelling, orthopnea, chest tightness   HPI: Mandy Rhodes is a 77 y.o. female with medical history significant for chronic kidney disease stage IV, chronic combined systolic/diastolic CHF, now presenting to the emergency department for evaluation of shortness of breath, increased bilateral leg edema, orthopnea, and chest tightness.  Patient reports noting the insidious worsening in her chronic bilateral lower extremity edema over the past few days, as well as worsening orthopnea.  She has also had "chest tightness" but denies chest pain.  She has had some dyspnea with exertion, but now presents after developing shortness of breath while at rest over the course of the past day.  She reports a mild nonproductive cough.  She denies calf tenderness.  She denies fevers or chills.  ED Course: Upon arrival to the ED, patient is found to be saturating adequately on room air, tachypnea, bradycardic, and with low-nor EKG features a sinus rhythm with PAC, nonspecific IVCDmal BP.  With repolarization abnormality.  Chest x-ray is notable for prominent cardiac enlargement, left pleural effusion, and left basilar atelectasis or consolidation.  Chemistry panel is notable for a sodium of 129, BUN 109, and creatinine 2.62, similar to priors.  CBC is unremarkable.  Troponin is elevated to 0.34 and BNP is elevated to 660.  Patient was treated with 120 mg IV Lasix in the ED.  UA was ordered but not yet collected.  Patient has begun to diurese and reports some subjective improvement.  She remains hemodynamically stable and will be admitted for ongoing evaluation and management.  Review of Systems:  All other systems reviewed and apart from HPI, are negative.  Past Medical History:  Diagnosis Date  . Anemia   . Arthritis   . CAD  (coronary artery disease)    a. minimal CAD by cath in 03/2016  . CHF (congestive heart failure) ([place])    a. 09/2016: Echo with EF of 30-35%, moderate diffuse HK, Grade 2 DD, PA peak pressure of 39 mm Hg.  Marland Kitchen Chronic kidney disease    Renal Insuffiency, see [facility] once a year  . Diabetes mellitus    type 2  . GERD (gastroesophageal reflux disease)   . History of rotator cuff tear    Torn right rotator cuff.  . History of spinal stenosis   . Hypertension   . Infiltrative cardiomyopathy ([place])    a, presumed cardiac amyloidosis by MRI with prior nondiagnostic fat pad biopsy  . Neuropathy associated with endocrine disorder [facility])     Past Surgical History:  Procedure Laterality Date  . ABDOMINAL HYSTERECTOMY  1971   Partial  . ANTERIOR CERVICAL DECOMP/DISCECTOMY FUSION N/A 10/18/2016   Procedure: debridement and drainage cervical prevertebral abscess  ANTERIOR CERVICAL HARDWARE REMOVAL exploration cervical fusion;  Surgeon: Earnie Larsson, MD;  Location: [place];  Service: Neurosurgery;  Laterality: N/A;  . APPENDECTOMY    . BACK SURGERY  2009   Disectomy  . CARDIAC CATHETERIZATION N/A 03/22/2016   Procedure: Left Heart Cath and Coronary Angiography;  Surgeon: Peter M Martinique, MD;  Location: [place] [facility];  Service: Cardiovascular;  Laterality: N/A;  . CHOLECYSTECTOMY  1969   Status post   . CYST REMOVAL TRUNK Right 12/03/2014   Procedure: EXCISION OF RIGHT BACK CYST ;  Surgeon: Coralie Keens, MD;  Location: [place];  Service:  General;  Laterality: Right;  . DIRECT LARYNGOSCOPY N/A 10/17/2016   Procedure: DIRECT LARYNGOSCOPY,  ESOPHAGOSCOPY;  Surgeon: [name], MD;  [facility];  Service: ENT;  Laterality: N/A;  . FOOT SURGERY Bilateral    bone removed  . IR GASTROSTOMY TUBE REMOVAL  05/03/2017  . IR GENERIC HISTORICAL  10/25/2016   IR GASTROSTOMY TUBE MOD SED 10/25/2016 [name], MD [facility]-INTERV RAD  . NEUROPLASTY / TRANSPOSITION MEDIAN NERVE AT CARPAL TUNNEL  Bilateral    Hx  Left carpal tunnel repair  . TRACHEOSTOMY TUBE PLACEMENT N/A 10/17/2016   Procedure: TRACHEOSTOMY;  Surgeon: [name], MD;  [facility];  Service: ENT;  Laterality: N/A;     reports that she has quit smoking. Her smoking use included cigarettes. She has a 8.25 pack-year smoking history. She has never used smokeless tobacco. She reports that she does not drink alcohol or use drugs.  Allergies  Allergen Reactions  . Sulfa Antibiotics Swelling    Family History  Problem Relation Age of Onset  . Diabetes type II Mother   . Hypertension Father   . Diabetes type II Sister   . Ovarian cancer Sister   . Diabetes type II Other   . Stroke Neg Hx   . CAD Neg Hx   . Heart failure Neg Hx      Prior to Admission medications   Medication Sig Start Date End Date Taking? Authorizing Provider  acetaminophen (TYLENOL) 325 MG tablet Take 650 mg by mouth every 6 (six) hours as needed for mild pain.   Yes [provider]  aspirin 81 MG EC tablet Chew 81 mg by mouth daily.    Yes [provider]  Calcium Carb-Cholecalciferol (CALCIUM 600 + D PO) Take 1 tablet by mouth daily.    Yes [provider]  ferrous sulfate 325 (65 FE) MG EC tablet Take 325 mg by mouth daily.    Yes [provider]  furosemide (LASIX) 80 MG tablet Take 160 mg by mouth 2 (two) times daily.   Yes [provider]  metoprolol succinate (TOPROL-XL) 25 MG 24 hr tablet Take 0.5 tablets (12.5 mg total) by mouth daily. 06/07/18  Yes Hilty, Nadean Corwin, MD  Multiple Vitamin (TAB-A-VITE) TABS Take 1 tablet by mouth daily.    Yes [provider]  omeprazole (PRILOSEC) 40 MG capsule Take 1 capsule (40 mg total) by mouth daily. 11/11/17  Yes Hongalgi, Lenis Dickinson, MD  potassium chloride 20 MEQ TBCR Take 20 mEq by mouth daily. 11/11/17  Yes Modena Jansky, MD    Physical Exam: Vitals:   06/15/18 0200 06/15/18 0215 06/15/18 0245 06/15/18 0306  BP: 101/83  101/82     Pulse: 65     Resp: (!) 23  (!) 25   Temp:    98.7 F (37.1 C)  TempSrc:    Oral  SpO2: 96% 97%    Weight:      Height:          Constitutional: NAD, calm  Eyes: PERTLA, lids and conjunctivae normal ENMT: Mucous membranes are moist. Posterior pharynx clear of any exudate or lesions.   Neck: normal, supple, no masses, no thyromegaly Respiratory: Mild tachypnea, mild dyspnea with speech. No wheeze or rhonchi. No accessory muscle use.  Cardiovascular: S1 & S2 heard, regular rate and rhythm. 2+ pretibial edema bilaterally. Abdomen: No distension, no tenderness, soft. Bowel sounds normal.  Musculoskeletal: no clubbing / cyanosis. No joint deformity upper and lower extremities.  Skin: no significant rashes, lesions, ulcers. Warm, dry, well-perfused. Neurologic: CN 2-12 grossly intact. Sensation intact. Strength 5/5 in all 4 limbs.  Psychiatric: Alert and oriented to person, place, and situation. Calm, cooperative.     Labs on Admission: I have personally reviewed following labs and imaging studies  CBC: Recent Labs  Lab 06/13/18 1145 05/31/2018 2159  WBC 6.6 6.3  HGB 11.3 12.4  HCT 34.8 36.1  MCV 94 91.2  PLT 160 161   Basic Metabolic Panel: Recent Labs  Lab 06/13/18 1145 06/15/18 0034  NA 133* 129*  K 4.4 4.4  CL 91* 93*  CO2 22 21*  GLUCOSE 122* 134*  BUN 95* 109*  CREATININE 2.48* 2.62*  CALCIUM 9.8 9.7   GFR: Estimated Creatinine Clearance: 19.6 mL/min (A) (by C-G formula based on SCr of 2.62 mg/dL (H)). Liver Function Tests: Recent Labs  Lab 06/15/18 0034  AST 19  ALT 20  ALKPHOS 134*  BILITOT 1.5*  PROT 7.2  ALBUMIN 3.1*   No results for input(s): LIPASE, AMYLASE in the last 168 hours. No results for input(s): AMMONIA in the last 168 hours. Coagulation Profile: No results for input(s): INR, PROTIME in the last 168 hours. Cardiac Enzymes: No results for input(s): CKTOTAL, CKMB, CKMBINDEX, TROPONINI in the last 168 hours. BNP (last 3  results) Recent Labs    06/13/18 1145  PROBNP 23,467*   HbA1C: No results for input(s): HGBA1C in the last 72 hours. CBG: No results for input(s): GLUCAP in the last 168 hours. Lipid Profile: No results for input(s): CHOL, HDL, LDLCALC, TRIG, CHOLHDL, LDLDIRECT in the last 72 hours. Thyroid Function Tests: No results for input(s): TSH, T4TOTAL, FREET4, T3FREE, THYROIDAB in the last 72 hours. Anemia Panel: No results for input(s): VITAMINB12, FOLATE, FERRITIN, TIBC, IRON, RETICCTPCT in the last 72 hours. Urine analysis:    Component Value Date/Time   COLORURINE AMBER (A) 06/15/2018 0257   APPEARANCEUR CLOUDY (A) 06/15/2018 0257   LABSPEC 1.011 06/15/2018 0257   LABSPEC 1.020 12/22/2010 1142   PHURINE 5.0 06/15/2018 0257   GLUCOSEU NEGATIVE 06/15/2018 0257   HGBUR LARGE (A) 06/15/2018 0257   BILIRUBINUR NEGATIVE 06/15/2018 0257   BILIRUBINUR Negative 12/22/2010 1142   KETONESUR NEGATIVE 06/15/2018 0257   PROTEINUR NEGATIVE 06/15/2018 0257   UROBILINOGEN 0.2 07/15/2015 1349   NITRITE NEGATIVE 06/15/2018 0257   LEUKOCYTESUR LARGE (A) 06/15/2018 0257   LEUKOCYTESUR Negative 12/22/2010 1142   Sepsis Labs: @LABRCNTIP (procalcitonin:4,lacticidven:4) )No results found for this or any previous visit (from the past 240 hour(s)).   Radiological Exams on Admission: Dg Chest Port 1 View  Result Date: 06/08/2018 CLINICAL DATA:  Generalized chest pain started at 2000 hours while eating. History of congestive heart failure, coronary artery disease, hypertension EXAM: PORTABLE CHEST 1 VIEW COMPARISON:  05/26/2018 FINDINGS: Prominent cardiac enlargement. Probable left pleural effusion with left basilar atelectasis or consolidation. Right lung appears clear. No significant change since previous study. Calcified and tortuous aorta. No pneumothorax. Degenerative changes in the spine and shoulders. IMPRESSION: Prominent cardiac enlargement. Left pleural effusion with left basilar atelectasis or  consolidation. No change. Electronically Signed   By: Lucienne Capers M.D.   On: 06/16/2018 21:41    EKG: Independently reviewed. Sinus rhythm, PAC, non-specific IVCD with repolarization abnormality.   Assessment/Plan   1. Acute on chronic systolic and diastolic CHF  - Presents with several days of increasing bilateral leg edema and orthopnea, and 1 day of dyspnea at rest  - Denies fever/chills or productive cough; no evidence  for DVT  - Suspect her sxs are secondary to CHF  - She was treated with 120 mg IV Lasix in ED  - Continue diuresis with Lasix 80 mg IV q12h, SLIV, follow daily wt and I/O's, continue beta-blocker as tolerated, update echo    2. CKD stage IV  - SCr is stable at 2.62 on admission but BUN has increased  - Follow daily chem panel during diuresis, renally-dose medications    3. Elevated troponin  - Troponin elevated to 0.34 in ED, appears to be chronic and similar to priors  - Cath from May 2017 with minor nonobstructive CAD  - Pt reports constant "chest tightness" for the past few days, but denies pain  - Plan to continue cardiac monitoring, trend troponin, continue ASA and beta-blocker, echo as above    4. Hyponatremia  - Serum sodium is 129 on admission in setting of hypervolemia  - Likely secondary to CHF, renal failure  - Follow closely with daily chem panel during diuresis     DVT prophylaxis: sq heparin  Code Status: Full   Family Communication: Discussed with patient  Consults called: None Admission status: Inpatient     Vianne Bulls, MD [facility] Pager [phone]  If 7PM-7AM, please contact night-coverage [email or web address] Password [facility]  06/15/2018, 3:35 AM

## 2018-06-15 NOTE — Plan of Care (Signed)
  Problem: Education: Goal: Knowledge of General Education information will improve Description Including pain rating scale, medication(s)/side effects and non-pharmacologic comfort measures Outcome: Completed/Met

## 2018-06-15 NOTE — Consult Note (Addendum)
CONSULTATION NOTE   Patient Name: Mandy Rhodes Date of Encounter: 06/15/2018 Cardiologist: Pixie Casino, MD  Chief Complaint   Shortness of breath  Patient Profile   77 yo female patient of mine with history of acute combined systolic and diastolic heart failure with declining LVEF to have a 25 to 30% in December 2018.  She is now admitted for increasing shortness of breath and volume overload concerning for acute congestive heart failure.  HPI   Mandy Rhodes is a 77 y.o. female who is being seen today for the evaluation of CHF at the request of Dr. Marthenia Rolling. This is a well-known 77 year old female patient of mine with a history of chronic systolic congestive heart failure with minimal coronary disease by cath in 2017, last seen by myself in April 2019 with worsening cardiomyopathy.  She is also been diagnosed with possible myelodysplasia and is felt that she may have an infiltrative cardiomyopathy such as amyloidosis.  She has stage III chronic kidney disease.  She was felt to be euvolemic at the time however hypotensive, not allowing up titration of her medications.  She was seen again in the office in July for worsening lower extremity edema.  There is confusion about what dose of Lasix she should be on and it appears that she was only taking about half of what was recommended.  He is also followed by Dr. Joelyn Oms with nephrology.  Metolazone was added and she was seen in follow-up a week later.  She did note improvement in her symptoms with metolazone and increased dose Lasix and her BNP improved from 1,289-660.  Troponin was flat elevated around 0.32- 0.34.  This likely represents demand ischemia.  She now presents with worsening shortness of breath, chest tightness and mild nonproductive cough over the past day.  She was given 120 mg IV Lasix in the ER with subjective improvement. I's/O's not accurately recorded. Weight likely inacurate as it is up 11 lbs since yesterday. Cardiology  is consulted for management recommendations.  PMHx   Past Medical History:  Diagnosis Date  . Anemia   . Arthritis   . CAD (coronary artery disease)    a. minimal CAD by cath in 03/2016  . CHF (congestive heart failure) ([place])    a. 09/2016: Echo with EF of 30-35%, moderate diffuse HK, Grade 2 DD, PA peak pressure of 39 mm Hg.  Marland Kitchen Chronic kidney disease    Renal Insuffiency, see [facility] once a year  . Diabetes mellitus    type 2  . GERD (gastroesophageal reflux disease)   . History of rotator cuff tear    Torn right rotator cuff.  . History of spinal stenosis   . Hypertension   . Infiltrative cardiomyopathy ([place])    a, presumed cardiac amyloidosis by MRI with prior nondiagnostic fat pad biopsy  . Neuropathy associated with endocrine disorder [facility])     Past Surgical History:  Procedure Laterality Date  . ABDOMINAL HYSTERECTOMY  1971   Partial  . ANTERIOR CERVICAL DECOMP/DISCECTOMY FUSION N/A 10/18/2016   Procedure: debridement and drainage cervical prevertebral abscess  ANTERIOR CERVICAL HARDWARE REMOVAL exploration cervical fusion;  Surgeon: Earnie Larsson, MD;  Location: [facility];  Service: Neurosurgery;  Laterality: N/A;  . APPENDECTOMY    . BACK SURGERY  2009   Disectomy  . CARDIAC CATHETERIZATION N/A 03/22/2016   Procedure: Left Heart Cath and Coronary Angiography;  Surgeon: Peter M Martinique, MD;  Location: [facility];  Service: Cardiovascular;  Laterality: N/A;  . CHOLECYSTECTOMY  1969   Status post   . CYST REMOVAL TRUNK Right 12/03/2014   Procedure: EXCISION OF RIGHT BACK CYST ;  Surgeon: Coralie Keens, MD;  Location: [facility];  Service: General;  Laterality: Right;  . DIRECT LARYNGOSCOPY N/A 10/17/2016   Procedure: DIRECT LARYNGOSCOPY,  ESOPHAGOSCOPY;  Surgeon: [name], MD;  Location: [facility];  Service: ENT;  Laterality: N/A;  . FOOT SURGERY Bilateral    bone removed  . IR GASTROSTOMY TUBE REMOVAL  05/03/2017  . IR GENERIC HISTORICAL  10/25/2016   IR  GASTROSTOMY TUBE MOD SED 10/25/2016 [name], MD [facility]  . NEUROPLASTY / TRANSPOSITION MEDIAN NERVE AT CARPAL TUNNEL Bilateral    Hx  Left carpal tunnel repair  . TRACHEOSTOMY TUBE PLACEMENT N/A 10/17/2016   Procedure: TRACHEOSTOMY;  Surgeon: [name], MD;  Location: [facility];  Service: ENT;  Laterality: N/A;    FAMHx   Family History  Problem Relation Age of Onset  . Diabetes type II Mother   . Hypertension Father   . Diabetes type II Sister   . Ovarian cancer Sister   . Diabetes type II Other   . Stroke Neg Hx   . CAD Neg Hx   . Heart failure Neg Hx     SOCHx    reports that she has quit smoking. Her smoking use included cigarettes. She has a 8.25 pack-year smoking history. She has never used smokeless tobacco. She reports that she does not drink alcohol or use drugs.  Outpatient Medications   No current facility-administered medications on file prior to encounter.    Current Outpatient Medications on File Prior to Encounter  Medication Sig Dispense Refill  . acetaminophen (TYLENOL) 325 MG tablet Take 650 mg by mouth every 6 (six) hours as needed for mild pain.    Marland Kitchen aspirin 81 MG EC tablet Chew 81 mg by mouth daily.     . Calcium Carb-Cholecalciferol (CALCIUM 600 + D PO) Take 1 tablet by mouth daily.     . ferrous sulfate 325 (65 FE) MG EC tablet Take 325 mg by mouth daily.     . furosemide (LASIX) 80 MG tablet Take 160 mg by mouth 2 (two) times daily.    . metoprolol succinate (TOPROL-XL) 25 MG 24 hr tablet Take 0.5 tablets (12.5 mg total) by mouth daily. 45 tablet 1  . Multiple Vitamin (TAB-A-VITE) TABS Take 1 tablet by mouth daily.     Marland Kitchen omeprazole (PRILOSEC) 40 MG capsule Take 1 capsule (40 mg total) by mouth daily.    . potassium chloride 20 MEQ TBCR Take 20 mEq by mouth daily. 30 tablet 0    Inpatient Medications    Scheduled Meds: . aspirin EC  81 mg Oral Daily  . furosemide  80 mg Intravenous Q12H  . heparin  5,000 Units Subcutaneous Q8H  .  midodrine  10 mg Oral TID WC  . multivitamin with minerals  1 tablet Oral Daily  . pantoprazole  40 mg Oral Daily  . sodium chloride flush  3 mL Intravenous Q12H    Continuous Infusions: . sodium chloride      PRN Meds: sodium chloride, acetaminophen, ondansetron (ZOFRAN) IV, sodium chloride flush   ALLERGIES   Allergies  Allergen Reactions  . Sulfa Antibiotics Swelling    ROS   Pertinent items noted in HPI and remainder of comprehensive ROS otherwise negative.  Vitals   Vitals:   06/15/18 0340 06/15/18 0818 06/15/18 1035 06/15/18  1100  BP: 101/77 (!) 89/59 95/64 (!) 77/68  Pulse: 76 (!) 38 69 67  Resp:  (!) 25  (!) 30  Temp:  98.7 F (37.1 C)    TempSrc: Oral Oral    SpO2: 94% 100%  95%  Weight: 182 lb 1.6 oz (82.6 kg)     Height: _0  (1.702 m)       Intake/Output Summary (Last 24 hours) at 06/15/2018 1719 Last data filed at 06/15/2018 0900 Gross per 24 hour  Intake 290 ml  Output -  Net 290 ml   Filed Weights   06/18/2018 2110 06/15/18 0340  Weight: 171 lb (77.6 kg) 182 lb 1.6 oz (82.6 kg)    Physical Exam   General appearance: alert and no distress Neck: JVD - several cm above sternal notch, no carotid bruit and thyroid not enlarged, symmetric, no tenderness/mass/nodules Lungs: diminished breath sounds LLL Heart: irregularly irregular rhythm Abdomen: mildly protuberant, non-tender Extremities: edema 1+ edema bilaterally Pulses: 2+ and symmetric Skin: Skin color, texture, turgor normal. No rashes or lesions Neurologic: Mental status: Alert, oriented, thought content appropriate, recognizes me Psych: Flat affect  Labs   Results for orders placed or performed during the hospital encounter of 05/22/2018 (from the past 48 hour(s))  CBC     Status: Abnormal   Collection Time: 05/28/2018  9:59 PM  Result Value Ref Range   WBC 6.3 4.0 - 10.5 K/uL   RBC 3.96 3.87 - 5.11 MIL/uL   Hemoglobin 12.4 12.0 - 15.0 g/dL   HCT 36.1 36.0 - 46.0 %   MCV 91.2 78.0 -  100.0 fL   MCH 31.3 26.0 - 34.0 pg   MCHC 34.3 30.0 - 36.0 g/dL   RDW 15.9 (H) 11.5 - 15.5 %   Platelets 162 150 - 400 K/uL    Comment: Performed at [facility], [place] [address], [place], [place] [address]  Brain natriuretic peptide     Status: Abnormal   Collection Time: 06/11/2018  9:59 PM  Result Value Ref Range   B Natriuretic Peptide 660.1 (H) 0.0 - 100.0 pg/mL    Comment: Performed at [facility] [address], [place], [place] [address]  I-stat troponin, ED     Status: Abnormal   Collection Time: 06/01/2018 10:05 PM  Result Value Ref Range   Troponin i, poc 0.34 (HH) 0.00 - 0.08 ng/mL   Comment NOTIFIED PHYSICIAN    Comment 3            Comment: Due to the release kinetics of cTnI, a negative result within the first hours of the onset of symptoms does not rule out myocardial infarction with certainty. If myocardial infarction is still suspected, repeat the test at appropriate intervals.   Comprehensive metabolic panel     Status: Abnormal   Collection Time: 06/15/18 12:34 AM  Result Value Ref Range   Sodium 129 (L) 135 - 145 mmol/L   Potassium 4.4 3.5 - 5.1 mmol/L   Chloride 93 (L) 98 - 111 mmol/L   CO2 21 (L) 22 - 32 mmol/L   Glucose, Bld 134 (H) 70 - 99 mg/dL   BUN 109 (H) 8 - 23 mg/dL    Comment: DELTA CHECK NOTED   Creatinine, Ser 2.62 (H) 0.44 - 1.00 mg/dL   Calcium 9.7 8.9 - 10.3 mg/dL   Total Protein 7.2 6.5 - 8.1 g/dL   Albumin 3.1 (L) 3.5 - 5.0 g/dL   AST 19 15 - 41  U/L   ALT 20 0 - 44 U/L   Alkaline Phosphatase 134 (H) 38 - 126 U/L   Total Bilirubin 1.5 (H) 0.3 - 1.2 mg/dL   GFR calc non Af Amer 17 (L) >60 mL/min   GFR calc Af Amer 19 (L) >60 mL/min    Comment: (NOTE) The eGFR has been calculated using the CKD EPI equation. This calculation has not been validated in all clinical situations. eGFR's persistently <60 mL/min signify possible Chronic Kidney Disease.    Anion gap 15 5 - 15    Comment: Performed at [facility] [address] [place], [place] [address]  Urinalysis, Routine w reflex microscopic     Status: Abnormal   Collection Time: 06/15/18  2:57 AM  Result Value Ref Range   Color, Urine AMBER (A) YELLOW    Comment: BIOCHEMICALS MAY BE AFFECTED BY COLOR   APPearance CLOUDY (A) CLEAR   Specific Gravity, Urine 1.011 1.005 - 1.030   pH 5.0 5.0 - 8.0   Glucose, UA NEGATIVE NEGATIVE mg/dL   Hgb urine dipstick LARGE (A) NEGATIVE   Bilirubin Urine NEGATIVE NEGATIVE   Ketones, ur NEGATIVE NEGATIVE mg/dL   Protein, ur NEGATIVE NEGATIVE mg/dL   Nitrite NEGATIVE NEGATIVE   Leukocytes, UA LARGE (A) NEGATIVE   RBC / HPF 11-20 0 - 5 RBC/hpf   WBC, UA 21-50 0 - 5 WBC/hpf   Bacteria, UA MANY (A) NONE SEEN   Squamous Epithelial / LPF 21-50 0 - 5   Hyaline Casts, UA PRESENT     Comment: Performed at [facility], [address] [place], [place] [address]  MRSA PCR Screening     Status: None   Collection Time: 06/15/18  3:44 AM  Result Value Ref Range   MRSA by PCR NEGATIVE NEGATIVE    Comment:        The GeneXpert MRSA Assay (FDA approved for NASAL specimens only), is one component of a comprehensive MRSA colonization surveillance program. It is not intended to diagnose MRSA infection nor to guide or monitor treatment for MRSA infections. Performed at [facility], [place] [address], [place], [place] [address]   Troponin I (q 6hr x 3)     Status: Abnormal   Collection Time: 06/15/18  3:58 AM  Result Value Ref Range   Troponin I 0.27 (HH) <0.03 ng/mL    Comment: CRITICAL RESULT CALLED TO, READ BACK BY AND VERIFIED WITH: HINCKLEY,C RN 06/15/2018 0537 JORDANS Performed at [facility], [place] [address], [place], [place] [address]     ECG   A-fib with CVR at 65 (7/26) - Personally Reviewed  Telemetry   Sinus rhythm with PVC's - Personally Reviewed  Radiology   Dg Chest Port 1 View  Result Date: 06/05/2018 CLINICAL DATA:  Generalized chest pain started at 2000 hours while eating.  History of congestive heart failure, coronary artery disease, hypertension EXAM: PORTABLE CHEST 1 VIEW COMPARISON:  05/26/2018 FINDINGS: Prominent cardiac enlargement. Probable left pleural effusion with left basilar atelectasis or consolidation. Right lung appears clear. No significant change since previous study. Calcified and tortuous aorta. No pneumothorax. Degenerative changes in the spine and shoulders. IMPRESSION: Prominent cardiac enlargement. Left pleural effusion with left basilar atelectasis or consolidation. No change. Electronically Signed   By: Lucienne Capers M.D.   On: 05/26/2018 21:41    Cardiac Studies   Echo pending  Impression   Principal Problem:   Acute on chronic combined systolic and  diastolic CHF  Active Problems:   CKD (chronic kidney disease) stage 4, GFR 15-29 ml/min (HCC)   Elevated troponin I level   Hyponatremia   A-fib (HCC)   Recommendation   1. Volume status is likely better than it was 2 weeks ago before increasing lasix and single dose of metolazone - BNP lower, but she is still dyspneic and reports chest tightness. Would continue diuresis. Hold BB and add midodrine 10 mg TID for BP augmentation. Will ask Dr. Haroldine Laws from the CHF service to evaluate tomorrow. ?Benefit of amyloid nuclear study - could she benefit from Tafamidis at this point? Also as pointed out by Dr. Haroldine Laws -there is new a-fib - CHADSVASC score of 6 (Age, Female, CHF, DM2, HTN) - plan Eliquis 5 mg BID.  Time Spent Directly with Patient:  I have spent a total of 45 minutes with the patient reviewing hospital notes, telemetry, EKGs, labs and examining the patient as well as establishing an assessment and plan that was discussed personally with the patient.  > 50% of time was spent in direct patient care.  Length of Stay:  LOS: 0 days   Pixie Casino, MD, [employer], [place] Director of the [facility] Lipid Disorders &  Cardiovascular [facility]  [facility] Diplomate of the American Board of Clinical Lipidology Attending Cardiologist  Direct Dial: [phone]  Fax: [phone]  Website:  [email or web address] 06/15/2018, 5:19 PM

## 2018-06-15 NOTE — Progress Notes (Signed)
Patient is a 77 year old African-American female, with past medical history significant for combined congestive heart failure, coronary infiltrative cardia myopathy with EF of 20 to 25%, chronic kidney disease stage IV, and query myelodysplastic syndrome.  Patient was admitted with worsening shortness of breath, significant leg edema that has worsened, orthopnea and chest tightness.  Sodium is 129, BUN of 129 and a serum creatinine of 2.62.  Cardiac BNP 660.  Will consult the cardiology team to assist with patient's management.  Will discuss option of ultrafiltration with the cardiology team.  Further management will depend on hospital course.

## 2018-06-15 NOTE — Consult Note (Addendum)
Advanced Heart Failure Team Consult Note   Primary Physician: Harlan Stains, MD PCP-Cardiologist:  Pixie Casino, MD  Reason for Consultation: A/C HF. ? amyloidosis  HPI:    Mandy Rhodes is a 77 y/o woman with h/o HTN, CKD 4 (baseline creatinine 2.5-2.9), DM2, myeloproliferative d/o and chronic systolic HF due to NICM (cath 2017 minimal CAD) whom we are asked to see by Dr. Debara Pickett for further evaluation of HF and possible cardiac amyloidosis.  She has been followed closely by Dr. Debara Pickett for progressive HF. She has also been seen by Dr. Joelyn Oms in Nephrology for CKD 3-4.   In 3/19 was seen by Dr. Lebron Conners in Oncology. W/u showed polyclonal gammopathy for both IgG and IgA without monoclonal gammopathy present.  Bone marrow findings are consistent with presence of polyclonal plasma cells. Given polyclonal gammopathy myeloma felt less likely. The bone marrow is somewhat hypercellular with hypogranular granulocytic precursors suggesting possible presence of myelodysplastic syndrome.  Recently seen in the Cardiology office with marked edema and it was found that she was taking the wrong dose of lasix. This was adjusted. She says her edema has improved since lasix was increased.   Presented to ER yesterday with continuous chest pressure, worsening SOB and orthopnea. SBP 80-90 range. Admitted for further evaluation.   She was seen by Dr. Debara Pickett today and started on IV lasix and midodrine for BP support.   She says she feels ok now but just feels tight in her chest and abdomen. Can get around with a walker and do ADLs but uses a WC for anything more   Echo 12/18 EF 25-30% Severe LVH with starry night pattern. RV moderate HK + pericardial effusion. Personally reviewed   Review of Systems: [y] = yes, [ ]  = no   General: Weight gain Blue.Reese ]; Weight loss [ ] ; Anorexia [ ] ; Fatigue [ y]; Fever [ ] ; Chills [ ] ; Weakness Blue.Reese ]  Cardiac: Chest pain/pressure Blue.Reese ]; Resting SOB Blue.Reese ]; Exertional SOB [ y];  Orthopnea Blue.Reese ]; Pedal Edema Blue.Reese ]; Palpitations [ ] ; Syncope [ ] ; Presyncope [ ] ; Paroxysmal nocturnal dyspnea[ ]   Pulmonary: Cough [ ] ; Wheezing[ ] ; Hemoptysis[ ] ; Sputum [ ] ; Snoring [ ]   GI: Vomiting[ ] ; Dysphagia[ ] ; Melena[ ] ; Hematochezia [ ] ; Heartburn[ ] ; Abdominal pain [ ] ; Constipation [ ] ; Diarrhea [ ] ; BRBPR [ ]   GU: Hematuria[ ] ; Dysuria [ ] ; Nocturia[ ]   Vascular: Pain in legs with walking [ ] ; Pain in feet with lying flat [ ] ; Non-healing sores [ ] ; Stroke [ ] ; TIA [ ] ; Slurred speech [ ] ;  Neuro: Headaches[ ] ; Vertigo[ ] ; Seizures[ ] ; Paresthesias[ ] ;Blurred vision [ ] ; Diplopia [ ] ; Vision changes [ ]   Ortho/Skin: Arthritis Blue.Reese ]; Joint pain Blue.Reese ]; Muscle pain [ ] ; Joint swelling [ ] ; Back Pain [ ] ; Rash [ ]   Psych: Depression[ ] ; Anxiety[ ]   Heme: Bleeding problems [ ] ; Clotting disorders [ ] ; Anemia [ y]  Endocrine: Diabetes [y]; Thyroid dysfunction[ ]   Home Medications Prior to Admission medications   Medication Sig Start Date End Date Taking? Authorizing Provider  acetaminophen (TYLENOL) 325 MG tablet Take 650 mg by mouth every 6 (six) hours as needed for mild pain.   Yes [provider]  aspirin 81 MG EC tablet Chew 81 mg by mouth daily.    Yes [provider]  Calcium Carb-Cholecalciferol (CALCIUM 600 + D PO) Take 1 tablet by mouth daily.    Yes [provider]  ferrous sulfate 325 (65 FE) MG EC tablet Take 325 mg by mouth daily.    Yes [provider]  furosemide (LASIX) 80 MG tablet Take 160 mg by mouth 2 (two) times daily.   Yes [provider]  metoprolol succinate (TOPROL-XL) 25 MG 24 hr tablet Take 0.5 tablets (12.5 mg total) by mouth daily. 06/07/18  Yes Hilty, Nadean Corwin, MD  Multiple Vitamin (TAB-A-VITE) TABS Take 1 tablet by mouth daily.    Yes [provider]  omeprazole (PRILOSEC) 40 MG capsule Take 1 capsule (40 mg total) by mouth daily. 11/11/17  Yes Hongalgi, Lenis Dickinson, MD  potassium chloride 20 MEQ TBCR Take  20 mEq by mouth daily. 11/11/17  Yes Modena Jansky, MD    Past Medical History: Past Medical History:  Diagnosis Date  . Anemia   . Arthritis   . CAD (coronary artery disease)    a. minimal CAD by cath in 03/2016  . CHF (congestive heart failure) ([place])    a. 09/2016: Echo with EF of 30-35%, moderate diffuse HK, Grade 2 DD, PA peak pressure of 39 mm Hg.  Marland Kitchen Chronic kidney disease    Renal Insuffiency, see [facility] once a year  . Diabetes mellitus    type 2  . GERD (gastroesophageal reflux disease)   . History of rotator cuff tear    Torn right rotator cuff.  . History of spinal stenosis   . Hypertension   . Infiltrative cardiomyopathy ([place])    a, presumed cardiac amyloidosis by MRI with prior nondiagnostic fat pad biopsy  . Neuropathy associated with endocrine disorder [facility])     Past Surgical History: Past Surgical History:  Procedure Laterality Date  . ABDOMINAL HYSTERECTOMY  1971   Partial  . ANTERIOR CERVICAL DECOMP/DISCECTOMY FUSION N/A 10/18/2016   Procedure: debridement and drainage cervical prevertebral abscess  ANTERIOR CERVICAL HARDWARE REMOVAL exploration cervical fusion;  Surgeon: Earnie Larsson, MD;  Location: [place];  Service: Neurosurgery;  Laterality: N/A;  . APPENDECTOMY    . BACK SURGERY  2009   Disectomy  . CARDIAC CATHETERIZATION N/A 03/22/2016   Procedure: Left Heart Cath and Coronary Angiography;  Surgeon: Peter M Martinique, MD;  Location: [place] [facility];  Service: Cardiovascular;  Laterality: N/A;  . CHOLECYSTECTOMY  1969   Status post   . CYST REMOVAL TRUNK Right 12/03/2014   Procedure: EXCISION OF RIGHT BACK CYST ;  Surgeon: Coralie Keens, MD;  Location: [place];  Service: General;  Laterality: Right;  . DIRECT LARYNGOSCOPY N/A 10/17/2016   Procedure: DIRECT LARYNGOSCOPY,  ESOPHAGOSCOPY;  Surgeon: [name], MD;  Location: [facility];  Service: ENT;  Laterality: N/A;  . FOOT SURGERY Bilateral    bone removed  . IR GASTROSTOMY TUBE REMOVAL   05/03/2017  . IR GENERIC HISTORICAL  10/25/2016   IR GASTROSTOMY TUBE MOD SED 10/25/2016 [name], MD [facility]  . NEUROPLASTY / TRANSPOSITION MEDIAN NERVE AT CARPAL TUNNEL Bilateral    Hx  Left carpal tunnel repair  . TRACHEOSTOMY TUBE PLACEMENT N/A 10/17/2016   Procedure: TRACHEOSTOMY;  Surgeon: [name], MD;  Location: [facility];  Service: ENT;  Laterality: N/A;    Family History: Family History  Problem Relation Age of Onset  . Diabetes type II Mother   . Hypertension Father   . Diabetes type II Sister   . Ovarian cancer Sister   . Diabetes type II Other   . Stroke Neg Hx   . CAD Neg  Hx   . Heart failure Neg Hx     Social History: Social History   Socioeconomic History  . Marital status: Divorced    Spouse name: Not on file  . Number of children: Not on file  . Years of education: Not on file  . Highest education level: Not on file  Occupational History  . Occupation: retired  Scientific laboratory technician  . Financial resource strain: Not on file  . Food insecurity:    Worry: Not on file    Inability: Not on file  . Transportation needs:    Medical: Not on file    Non-medical: Not on file  Tobacco Use  . Smoking status: Former Smoker    Packs/day: 0.25    Years: 33.00    Pack years: 8.25    Types: Cigarettes  . Smokeless tobacco: Never Used  . Tobacco comment: 12/02/13- quit over 20 years ago/smoked 33 years on and off  Substance and Sexual Activity  . Alcohol use: No  . Drug use: No  . Sexual activity: Not Currently  Lifestyle  . Physical activity:    Days per week: Not on file    Minutes per session: Not on file  . Stress: Not on file  Relationships  . Social connections:    Talks on phone: Not on file    Gets together: Not on file    Attends religious service: Not on file    Active member of club or organization: Not on file    Attends meetings of clubs or organizations: Not on file    Relationship status: Not on file  Other Topics Concern  . Not on  file  Social History Narrative  . Not on file    Allergies:  Allergies  Allergen Reactions  . Sulfa Antibiotics Swelling    Objective:    Vital Signs:   Temp:  [98.7 F (37.1 C)] 98.7 F (37.1 C) (07/27 0818) Pulse Rate:  [33-76] 67 (07/27 1100) Resp:  [19-31] 30 (07/27 1100) BP: (77-107)/(55-89) 77/68 (07/27 1100) SpO2:  [94 %-100 %] 95 % (07/27 1100) Weight:  [77.6 kg (171 lb)-82.6 kg (182 lb 1.6 oz)] 82.6 kg (182 lb 1.6 oz) (07/27 0340) Last BM Date: 05/23/2018  Weight change: Filed Weights   05/30/2018 2110 06/15/18 0340  Weight: 77.6 kg (171 lb) 82.6 kg (182 lb 1.6 oz)    Intake/Output:   Intake/Output Summary (Last 24 hours) at 06/15/2018 1702 Last data filed at 06/15/2018 0900 Gross per 24 hour  Intake 290 ml  Output -  Net 290 ml      Physical Exam    General:  Elderly woman lying in bed No resp difficulty HEENT: normal Neck: supple. JVP jaw  . Carotids 2+ bilat; no bruits. No lymphadenopathy or thyromegaly appreciated. Cor: PMI nondisplaced. Irregular rate & rhythm. 2/6 TR Lungs: clear Abdomen: obese soft, nontender, nondistended. No hepatosplenomegaly. No bruits or masses. Good bowel sounds. Extremities: no cyanosis, clubbing, rash, 2-3+ edema Neuro: alert & orientedx3, cranial nerves grossly intact. moves all 4 extremities w/o difficulty. Affect pleasant   Telemetry    AF 70-80s occasional PVCs Personally reviewed   EKG    AF 65. Low volts in limb leads. Personally reviewed   Labs   Basic Metabolic Panel: Recent Labs  Lab 06/13/18 1145 06/15/18 0034  NA 133* 129*  K 4.4 4.4  CL 91* 93*  CO2 22 21*  GLUCOSE 122* 134*  BUN 95* 109*  CREATININE 2.48* 2.62*  CALCIUM  9.8 9.7    Liver Function Tests: Recent Labs  Lab 06/15/18 0034  AST 19  ALT 20  ALKPHOS 134*  BILITOT 1.5*  PROT 7.2  ALBUMIN 3.1*   No results for input(s): LIPASE, AMYLASE in the last 168 hours. No results for input(s): AMMONIA in the last 168  hours.  CBC: Recent Labs  Lab 06/13/18 1145 06/18/2018 2159  WBC 6.6 6.3  HGB 11.3 12.4  HCT 34.8 36.1  MCV 94 91.2  PLT 160 162    Cardiac Enzymes: Recent Labs  Lab 06/15/18 0358  TROPONINI 0.27*    BNP: BNP (last 3 results) Recent Labs    11/07/17 1353 05/26/18 0845 05/23/2018 2159  BNP 1,122.8* 1,289.3* 660.1*    ProBNP (last 3 results) Recent Labs    06/13/18 1145  PROBNP 23,467*     CBG: No results for input(s): GLUCAP in the last 168 hours.  Coagulation Studies: No results for input(s): LABPROT, INR in the last 72 hours.   Imaging   Dg Chest Port 1 View  Result Date: 05/26/2018 CLINICAL DATA:  Generalized chest pain started at 2000 hours while eating. History of congestive heart failure, coronary artery disease, hypertension EXAM: PORTABLE CHEST 1 VIEW COMPARISON:  05/26/2018 FINDINGS: Prominent cardiac enlargement. Probable left pleural effusion with left basilar atelectasis or consolidation. Right lung appears clear. No significant change since previous study. Calcified and tortuous aorta. No pneumothorax. Degenerative changes in the spine and shoulders. IMPRESSION: Prominent cardiac enlargement. Left pleural effusion with left basilar atelectasis or consolidation. No change. Electronically Signed   By: Lucienne Capers M.D.   On: 06/13/2018 21:41      Medications:     Current Medications: . aspirin EC  81 mg Oral Daily  . furosemide  80 mg Intravenous Q12H  . heparin  5,000 Units Subcutaneous Q8H  . midodrine  10 mg Oral TID WC  . multivitamin with minerals  1 tablet Oral Daily  . pantoprazole  40 mg Oral Daily  . sodium chloride flush  3 mL Intravenous Q12H     Infusions: . sodium chloride       Patient Profile   77 y/o woman with h/o HTN, CKD 4 (baseline creatinine 2.5-2.9), DM2, myeloproliferative d/o and chronic systolic HF due to NICM (cath 2017 minimal CAD) admitted with recurrent HF and hypotension in setting of possible cardiac  amyloidosis  Assessment/Plan   1. Acute on chronic systolic HF - Echo 23/55 EF 25-30% Severe LVH with starry night pattern. RV moderate HK + pericardial effusion. Personally reviewed - Remains volume overloaded in setting of significant biventricular HF. BP low - Agree with IV lasix and midodrine - In looking at echo she clearly has cardiac amyloidosis. The question is whether this is AL amyloid (myeloma) or TTR amyloid. Based on previous w/u and presence of renal failure I favor AL but she has had polyclonal gammopathy. - Will repeat SPEP/UPEP. Consider skeletal survey and PYP scan.  - Will d/w Dr. Beryle Beams on Monday - Not b-blocker, ACE/ARB with hypotension and CKD 4 - Repeat echo  - Place TED hose - with cardiac amyloid, severe LV dysfunction and CKD 4 prognosis quite poor  2. Atrial fibrillation - This is new diagnosis. Rate is controlled. Well tolerated - Start Eliquis 5 bid. Stop ASA  3. CKD 4 - Stable. Watch closely with diuresis - Followed by Dr. Joelyn Oms in Nephrology  4. Chest tightness with mildly elevated troponin - Cath 2017 with minimal CAD - Doubt ACS. Likely  due to HF and cardiac strain    Length of Stay: 0  Glori Bickers, MD  06/15/2018, 5:02 PM  Advanced Heart Failure Team Pager [phone] (M-F; 7a - 4p)  Please contact [facility] for night-coverage after hours (4p -7a ) and weekends on [email or web address]

## 2018-06-16 ENCOUNTER — Inpatient Hospital Stay (HOSPITAL_COMMUNITY): Payer: Medicare HMO

## 2018-06-16 DIAGNOSIS — I361 Nonrheumatic tricuspid (valve) insufficiency: Secondary | ICD-10-CM

## 2018-06-16 LAB — CBC WITH DIFFERENTIAL/PLATELET
Abs Immature Granulocytes: 0 10*3/uL (ref 0.0–0.1)
Basophils Absolute: 0 10*3/uL (ref 0.0–0.1)
Basophils Relative: 0 %
Eosinophils Absolute: 0 10*3/uL (ref 0.0–0.7)
Eosinophils Relative: 0 %
HCT: 35.3 % — ABNORMAL LOW (ref 36.0–46.0)
Hemoglobin: 12.2 g/dL (ref 12.0–15.0)
Immature Granulocytes: 0 %
Lymphocytes Relative: 7 %
Lymphs Abs: 0.5 10*3/uL — ABNORMAL LOW (ref 0.7–4.0)
MCH: 31.2 pg (ref 26.0–34.0)
MCHC: 34.6 g/dL (ref 30.0–36.0)
MCV: 90.3 fL (ref 78.0–100.0)
Monocytes Absolute: 0.6 10*3/uL (ref 0.1–1.0)
Monocytes Relative: 8 %
Neutro Abs: 6.1 10*3/uL (ref 1.7–7.7)
Neutrophils Relative %: 85 %
Platelets: 153 10*3/uL (ref 150–400)
RBC: 3.91 MIL/uL (ref 3.87–5.11)
RDW: 15.6 % — ABNORMAL HIGH (ref 11.5–15.5)
WBC: 7.2 10*3/uL (ref 4.0–10.5)

## 2018-06-16 LAB — ECHOCARDIOGRAM COMPLETE
HEIGHTINCHES: 67 in
WEIGHTICAEL: 2878.33 [oz_av]

## 2018-06-16 LAB — GLUCOSE, CAPILLARY
GLUCOSE-CAPILLARY: 118 mg/dL — AB (ref 70–99)
GLUCOSE-CAPILLARY: 141 mg/dL — AB (ref 70–99)
Glucose-Capillary: 126 mg/dL — ABNORMAL HIGH (ref 70–99)
Glucose-Capillary: 138 mg/dL — ABNORMAL HIGH (ref 70–99)

## 2018-06-16 LAB — BASIC METABOLIC PANEL
Anion gap: 12 (ref 5–15)
BUN: 110 mg/dL — AB (ref 8–23)
CALCIUM: 9.6 mg/dL (ref 8.9–10.3)
CHLORIDE: 93 mmol/L — AB (ref 98–111)
CO2: 23 mmol/L (ref 22–32)
CREATININE: 2.83 mg/dL — AB (ref 0.44–1.00)
GFR calc Af Amer: 18 mL/min — ABNORMAL LOW (ref >60)
GFR, EST NON AFRICAN AMERICAN: 15 mL/min — AB (ref >60)
Glucose, Bld: 126 mg/dL — ABNORMAL HIGH (ref 70–99)
Potassium: 4.7 mmol/L (ref 3.5–5.1)
Sodium: 128 mmol/L — ABNORMAL LOW (ref 135–145)

## 2018-06-16 MED ORDER — SODIUM CHLORIDE 0.9 % IV SOLN
1.0000 g | Freq: Every day | INTRAVENOUS | Status: DC
  Administered 2018-06-16 – 2018-06-17 (×2): 1 g via INTRAVENOUS
  Filled 2018-06-16 (×2): qty 10

## 2018-06-16 NOTE — Progress Notes (Addendum)
Rectal Temp increase to 96.51F. Pt taking well. Very alert and oriented. Bair Hugger therapy continued, temperature reduced to lowest level on device. Pt states she feels very hot. Skin cool to touch.

## 2018-06-16 NOTE — Progress Notes (Signed)
At 1600 Rectal Temp 96.81F. Oral Temp 97.53F. Have been unable to obtain an oral temp until this time. Removed Bair Hugger, and will recheck in 1 hr.    1700 1hr re-check. Rectal Temp 97.28F and Oral Temp of 97.53F. Pt more alert, and "feeling better". Moved better in bed this time. Will continue to monitor.

## 2018-06-16 NOTE — Progress Notes (Signed)
Placed patient on bear hugger warming blanket to increase core temperature.  I will monitor patient in one hour.

## 2018-06-16 NOTE — Progress Notes (Signed)
.    Advanced Heart Failure Rounding Note   Subjective:    Feels weak. Diagnosed with UTI overnight.   Remains on high-dose IV lasix. Weight down 3 pounds. SBP now > 100 with midodrine.   Remains in AF 3s. Creatinine up from 2.6 -> 2.8. BUN 110    Objective:   Weight Range:  Vital Signs:   Temp:  [94.1 F (34.5 C)-94.8 F (34.9 C)] 94.5 F (34.7 C) (07/28 0751) Pulse Rate:  [60-81] 73 (07/28 0751) Resp:  [16-30] 21 (07/28 0751) BP: (77-109)/(64-85) 109/79 (07/28 0751) SpO2:  [93 %-96 %] 96 % (07/28 0751) Weight:  [81.6 kg (179 lb 14.3 oz)] 81.6 kg (179 lb 14.3 oz) (07/28 0421) Last BM Date: 06/10/2018  Weight change: Filed Weights   06/09/2018 2110 06/15/18 0340 06/16/18 0421  Weight: 77.6 kg (171 lb) 82.6 kg (182 lb 1.6 oz) 81.6 kg (179 lb 14.3 oz)    Intake/Output:   Intake/Output Summary (Last 24 hours) at 06/16/2018 0916 Last data filed at 06/16/2018 0008 Gross per 24 hour  Intake 53 ml  Output 150 ml  Net -97 ml     Physical Exam: General:  Elderly. Weak appearing. No resp difficulty HEENT: normal Neck: supple. JVP 10. Carotids 2+ bilat; no bruits. No lymphadenopathy or thryomegaly appreciated. Cor: PMI nondisplaced. Irregular rate & rhythm. 2/^ tr Lungs: clear Abdomen: obese soft, nontender, nondistended. No hepatosplenomegaly. No bruits or masses. Good bowel sounds. Extremities: no cyanosis, clubbing, rash, 2+ edema Neuro: alert & orientedx3, cranial nerves grossly intact. moves all 4 extremities w/o difficulty. Affect pleasant  Telemetry: AF with PVCs 70s. Personally reviewed   Labs: Basic Metabolic Panel: Recent Labs  Lab 06/13/18 1145 06/15/18 0034 06/16/18 0656  NA 133* 129* 128*  K 4.4 4.4 4.7  CL 91* 93* 93*  CO2 22 21* 23  GLUCOSE 122* 134* 126*  BUN 95* 109* 110*  CREATININE 2.48* 2.62* 2.83*  CALCIUM 9.8 9.7 9.6    Liver Function Tests: Recent Labs  Lab 06/15/18 0034  AST 19  ALT 20  ALKPHOS 134*  BILITOT 1.5*  PROT 7.2    ALBUMIN 3.1*   No results for input(s): LIPASE, AMYLASE in the last 168 hours. No results for input(s): AMMONIA in the last 168 hours.  CBC: Recent Labs  Lab 06/13/18 1145 05/23/2018 2159  WBC 6.6 6.3  HGB 11.3 12.4  HCT 34.8 36.1  MCV 94 91.2  PLT 160 162    Cardiac Enzymes: Recent Labs  Lab 06/15/18 0358  TROPONINI 0.27*    BNP: BNP (last 3 results) Recent Labs    11/07/17 1353 05/26/18 0845 06/17/2018 2159  BNP 1,122.8* 1,289.3* 660.1*    ProBNP (last 3 results) Recent Labs    06/13/18 1145  PROBNP 23,467*      Other results:  Imaging: Dg Chest Port 1 View  Result Date: 05/20/2018 CLINICAL DATA:  Generalized chest pain started at 2000 hours while eating. History of congestive heart failure, coronary artery disease, hypertension EXAM: PORTABLE CHEST 1 VIEW COMPARISON:  05/26/2018 FINDINGS: Prominent cardiac enlargement. Probable left pleural effusion with left basilar atelectasis or consolidation. Right lung appears clear. No significant change since previous study. Calcified and tortuous aorta. No pneumothorax. Degenerative changes in the spine and shoulders. IMPRESSION: Prominent cardiac enlargement. Left pleural effusion with left basilar atelectasis or consolidation. No change. Electronically Signed   By: Lucienne Capers M.D.   On: 06/10/2018 21:41      Medications:     Scheduled  Medications: . apixaban  5 mg Oral BID  . furosemide  80 mg Intravenous Q12H  . insulin aspart  0-5 Units Subcutaneous QHS  . insulin aspart  0-9 Units Subcutaneous TID WC  . midodrine  10 mg Oral TID WC  . multivitamin with minerals  1 tablet Oral Daily  . pantoprazole  40 mg Oral Daily  . sodium chloride flush  3 mL Intravenous Q12H     Infusions: . sodium chloride    . cefTRIAXone (ROCEPHIN)  IV       PRN Medications:  sodium chloride, acetaminophen, alum & mag hydroxide-simeth, diphenhydrAMINE, ondansetron (ZOFRAN) IV, sodium chloride flush   Assessment:    77 y/o woman with h/o HTN, CKD 4 (baseline creatinine 2.5-2.9), DM2, myeloproliferative d/o and chronic systolic HF due to NICM (cath 2017 minimal CAD) admitted with recurrent HF and hypotension in setting of possible cardiac amyloidosis     Plan/Discussion:     1. Acute on chronic systolic HF - Echo 16/10 EF 25-30% Severe LVH with starry night pattern. RV moderate HK + pericardial effusion. Personally reviewed - Repeat echo pending - Remains volume overloaded in setting of significant biventricular HF.  - Diuresing with IV lasix but creatinine up slightly. Will continue IV diuresis but with RV failure and renal failure this may be as good as we can get her. BP improved with midodrine - In looking at echo she clearly has cardiac amyloidosis. The question is whether this is AL amyloid (myeloma) or TTR amyloid. Based on previous w/u and presence of renal failure I favor AL but she has had polyclonal gammopathy. - Will repeat SPEP/UPEP. Consider skeletal survey and PYP scan.  - I have sent a message to [place] to ask for his input - Not b-blocker, ACE/ARB with hypotension and CKD 4 - Repeat echo  - Refuses TED hose - with cardiac amyloid, severe LV dysfunction and CKD 4 prognosis quite poor. Will need to broach EOL discussions with her and family. Consider Palliative care consult on Monday   2. Paroxysmal Atrial fibrillation - new onset  - This is new diagnosis. Rate is controlled. Well tolerated - Start Eliquis 5 bid. Stop ASA  3. CKD 4 - Creatinine up slightly with diuresis. With severe biventricular dysfunction likely not candidate for HD - Followed by Dr. Joelyn Oms in Nephrology  4. Chest tightness with mildly elevated troponin - Cath 2017 with minimal CAD - Doubt ACS. Likely due to HF and cardiac strain   5. UTI - per primary team   Length of Stay: 1   Glori Bickers MD 06/16/2018, 9:16 AM  Advanced Heart Failure Team Pager [phone] (M-F; [place])  Please  contact [facility] for night-coverage after hours (4p -7a ) and weekends on [email or web address]

## 2018-06-16 NOTE — Progress Notes (Signed)
Patient's rectal temp showing 94.1, was checked twice and this was the highest.  Axillary the highest was 92.4.  Notify MD on call for orders.  Advise Dr. Raiford Simmonds that patient's BP have been soft all day SBP in the low 100s, staff in the day were having trouble getting temp.  MD stated to place some warm blankets to see if temp can go up, ask to see if we can get a bear hugger, he agreed and to place order.   I have placed some heating pads under patient's axillary areas and near hear waist, additionally, also place extra blankets.   I will recheck temp in an hour and keep monitoring patient.

## 2018-06-16 NOTE — Progress Notes (Signed)
Recheck patient's rectal temperature is at 94.8.  Patient will stay with warming blanket will recheck before shift change.

## 2018-06-16 NOTE — Progress Notes (Signed)
No change in rectal temp 96.21F. Bair Hugger temperature increased to medium heat. Skin cool to touch.

## 2018-06-16 NOTE — Progress Notes (Addendum)
Temp increased to 95.57F. Pt more alert. Speech slightly delayed. Provider at bedside. Continue with [employer], and Q2hr [employer].

## 2018-06-16 NOTE — Progress Notes (Signed)
*  PRELIMINARY RESULTS* Echocardiogram 2D Echocardiogram has been performed.  Leavy Cella 06/16/2018, 2:51 PM

## 2018-06-16 NOTE — Progress Notes (Addendum)
Rectal temp showing 94.5 which is lower than last check. Pt difficult to arouse. Barely opens eyes, and slow to speak. Blankets removed from pt and Bair hugger reapplied directly on top of pt. Provider notified of drop in temperature. Continue with [employer], and Q2hr monitoring of Rectal Temp. Orders for Rocephan 1g daily, Urine culture, and Blood Cultures x2.   Pt states she is hot and does not want the [employer]. Education provided on Hypothermia, and body's responses to temperature changes.

## 2018-06-16 NOTE — Progress Notes (Signed)
PROGRESS NOTE    Mandy Rhodes  QZE:[id number] DOB: July 27, 1941 DOA: 05/24/2018 PCP: Harlan Stains, MD  Outpatient Specialists:    Brief Narrative:  Patient is a 77 year old African-American female, with past medical history significant for combined congestive heart failure, coronary artery disease, infiltrative cardiomyopathy with EF of 20 to 25%, chronic kidney disease stage IV, and query myelodysplastic syndrome.  Patient was admitted with worsening shortness of breath, significant leg edema that has worsened, orthopnea and chest tightness.  Cardiology team was consulted, and the cardiology team is directing patient's care.  The patient was hypotensive on presentation.  Patient has been started on midodrine.  Patient is currently being diuresed.  On presentation, sodium was 129, BUN 109, serum creatinine 2.62 and Cardiac BNP 660.    UA was suggestive of possible UTI.    06/16/2018: Patient noted to be hypothermic.  We will panculture patient.  Will start patient on IV Rocephin.  Patient is currently on warming blanket.  Assessment & Plan:   Principal Problem:   Acute on chronic combined systolic and diastolic CHF  Active Problems:   CKD (chronic kidney disease) stage 4, GFR 15-29 ml/min (HCC)   Elevated troponin I level   Hyponatremia   A-fib (HCC)   Cardiac amyloidosis/infiltrative cardiomyopathy   Possible UTI    Hypothermia   1. Acute on chronic systolic and diastolic CHF  - Presents with several days of increasing bilateral leg edema and orthopnea, and 1 day of dyspnea at rest  - Denies fever/chills or productive cough; no evidence for DVT  - Suspect her sxs are secondary to CHF  - She was treated with 120 mg IV Lasix in ED  - Continue diuresis with Lasix 80 mg IV q12h, SLIV, follow daily wt and I/O's, continue beta-blocker as tolerated, update echo   06/16/2018: Cardiology input is appreciated.  On midodrine and IV diuretics.  Cardiology is directing care.  2. CKD stage IV    - SCr is stable at 2.62 on admission but BUN has increased  - Follow daily chem panel during diuresis, renally-dose medications   -06/08/2018: Slight increase in patient's serum creatinine noted (2.62 2.83).  BUN is 110.  3. Elevated troponin  - Troponin elevated to 0.34 in ED, appears to be chronic and similar to priors  - Cath from May 2017 with minor nonobstructive CAD  - Pt reports constant "chest tightness" for the past few days, but denies pain  - Plan to continue cardiac monitoring, trend troponin, continue ASA and beta-blocker, echo as above    4. Hyponatremia  - Serum sodium is 129 on admission in setting of hypervolemia  - Likely secondary to CHF, renal failure  - Follow closely with daily chem panel during diuresis   -06/16/2018: This could be of prognostic significance i.e. guarded prognosis.  Likely UTI: -Panculture patient. -Start patient on IV Rocephin.  Hypothermia: -Possibly multifactorial. -Panculture patient. -Start patient on IV antibiotics. -Warming blanket. -Optimize congestive heart failure.  Guarded prognosis.   DVT prophylaxis: sq heparin  Code Status: Full   Family Communication: Discussed with patient  Admission status: Inpatient     Consultants:   Cardiology  Procedures:   Repeat echo done, result is still pending.  Antimicrobials:   IV Rocephin.   Subjective: No new complaints.  No fever or chills Still short of breath Significant lower extremity edema persists.   Objective: Vitals:   06/16/18 1000 06/16/18 1112 06/16/18 1200 06/16/18 1400  BP:  107/88    Pulse:  87    Resp:  (!) 25    Temp: (!) 95.8 F (35.4 C)  (!) 96.4 F (35.8 C) (!) 96.5 F (35.8 C)  TempSrc: Rectal  Rectal Rectal  SpO2:  96%    Weight:      Height:        Intake/Output Summary (Last 24 hours) at 06/16/2018 1433 Last data filed at 06/16/2018 1028 Gross per 24 hour  Intake 113 ml  Output 150 ml  Net -37 ml   Filed Weights   05/29/2018  2110 06/15/18 0340 06/16/18 0421  Weight: 77.6 kg (171 lb) 82.6 kg (182 lb 1.6 oz) 81.6 kg (179 lb 14.3 oz)    Examination:  General exam: Appears calm and comfortable  Respiratory system: Clear to auscultation.  Cardiovascular system: S1 & S2 heard Gastrointestinal system: Abdomen is nondistended, soft and nontender. No organomegaly or masses felt. Normal bowel sounds heard. Central nervous system: Alert and oriented. No focal neurological deficits. Extremities: 2+ to 3 bilateral lower extremity edema.  Psychiatry: Judgement and insight appear normal. Mood & affect appropriate.     Data Reviewed: I have personally reviewed following labs and imaging studies  CBC: Recent Labs  Lab 06/13/18 1145 05/20/2018 2159 06/16/18 1032  WBC 6.6 6.3 7.2  NEUTROABS  --   --  6.1  HGB 11.3 12.4 12.2  HCT 34.8 36.1 35.3*  MCV 94 91.2 90.3  PLT 160 162 267   Basic Metabolic Panel: Recent Labs  Lab 06/13/18 1145 06/15/18 0034 06/16/18 0656  NA 133* 129* 128*  K 4.4 4.4 4.7  CL 91* 93* 93*  CO2 22 21* 23  GLUCOSE 122* 134* 126*  BUN 95* 109* 110*  CREATININE 2.48* 2.62* 2.83*  CALCIUM 9.8 9.7 9.6   GFR: Estimated Creatinine Clearance: 18.6 mL/min (A) (by C-G formula based on SCr of 2.83 mg/dL (H)). Liver Function Tests: Recent Labs  Lab 06/15/18 0034  AST 19  ALT 20  ALKPHOS 134*  BILITOT 1.5*  PROT 7.2  ALBUMIN 3.1*   No results for input(s): LIPASE, AMYLASE in the last 168 hours. No results for input(s): AMMONIA in the last 168 hours. Coagulation Profile: No results for input(s): INR, PROTIME in the last 168 hours. Cardiac Enzymes: Recent Labs  Lab 06/15/18 0358  TROPONINI 0.27*   BNP (last 3 results) Recent Labs    06/13/18 1145  PROBNP 23,467*   HbA1C: No results for input(s): HGBA1C in the last 72 hours. CBG: Recent Labs  Lab 06/15/18 2100 06/16/18 0801 06/16/18 1112  GLUCAP 134* 118* 126*   Lipid Profile: No results for input(s): CHOL, HDL,  LDLCALC, TRIG, CHOLHDL, LDLDIRECT in the last 72 hours. Thyroid Function Tests: No results for input(s): TSH, T4TOTAL, FREET4, T3FREE, THYROIDAB in the last 72 hours. Anemia Panel: No results for input(s): VITAMINB12, FOLATE, FERRITIN, TIBC, IRON, RETICCTPCT in the last 72 hours. Urine analysis:    Component Value Date/Time   COLORURINE AMBER (A) 06/15/2018 0257   APPEARANCEUR CLOUDY (A) 06/15/2018 0257   LABSPEC 1.011 06/15/2018 0257   LABSPEC 1.020 12/22/2010 1142   PHURINE 5.0 06/15/2018 0257   GLUCOSEU NEGATIVE 06/15/2018 0257   HGBUR LARGE (A) 06/15/2018 0257   BILIRUBINUR NEGATIVE 06/15/2018 0257   BILIRUBINUR Negative 12/22/2010 1142   KETONESUR NEGATIVE 06/15/2018 0257   PROTEINUR NEGATIVE 06/15/2018 0257   UROBILINOGEN 0.2 07/15/2015 1349   NITRITE NEGATIVE 06/15/2018 0257   LEUKOCYTESUR LARGE (A) 06/15/2018 0257   LEUKOCYTESUR Negative 12/22/2010 1142   Sepsis Labs: @  LABRCNTIP(procalcitonin:4,lacticidven:4)  ) Recent Results (from the past 240 hour(s))  MRSA PCR Screening     Status: None   Collection Time: 06/15/18  3:44 AM  Result Value Ref Range Status   MRSA by PCR NEGATIVE NEGATIVE Final    Comment:        The GeneXpert MRSA Assay (FDA approved for NASAL specimens only), is one component of a comprehensive MRSA colonization surveillance program. It is not intended to diagnose MRSA infection nor to guide or monitor treatment for MRSA infections. Performed at [facility], [place] [address], [place], [place] [address]          Radiology Studies: Dg Chest Port 1 View  Result Date: 06/01/2018 CLINICAL DATA:  Generalized chest pain started at 2000 hours while eating. History of congestive heart failure, coronary artery disease, hypertension EXAM: PORTABLE CHEST 1 VIEW COMPARISON:  05/26/2018 FINDINGS: Prominent cardiac enlargement. Probable left pleural effusion with left basilar atelectasis or consolidation. Right lung appears clear. No significant  change since previous study. Calcified and tortuous aorta. No pneumothorax. Degenerative changes in the spine and shoulders. IMPRESSION: Prominent cardiac enlargement. Left pleural effusion with left basilar atelectasis or consolidation. No change. Electronically Signed   By: Lucienne Capers M.D.   On: 06/07/2018 21:41        Scheduled Meds: . apixaban  5 mg Oral BID  . furosemide  80 mg Intravenous Q12H  . insulin aspart  0-5 Units Subcutaneous QHS  . insulin aspart  0-9 Units Subcutaneous TID WC  . midodrine  10 mg Oral TID WC  . multivitamin with minerals  1 tablet Oral Daily  . pantoprazole  40 mg Oral Daily  . sodium chloride flush  3 mL Intravenous Q12H   Continuous Infusions: . sodium chloride    . cefTRIAXone (ROCEPHIN)  IV 1 g (06/16/18 1028)     LOS: 1 day    Time spent: 35 Minutes.    Dana Allan, MD  [facility] Pager #: [phone] 7PM-7AM contact night coverage as above

## 2018-06-17 LAB — CBC WITH DIFFERENTIAL/PLATELET
Abs Immature Granulocytes: 0 10*3/uL (ref 0.0–0.1)
Basophils Absolute: 0 10*3/uL (ref 0.0–0.1)
Basophils Relative: 0 %
Eosinophils Absolute: 0 10*3/uL (ref 0.0–0.7)
Eosinophils Relative: 0 %
HCT: 34.6 % — ABNORMAL LOW (ref 36.0–46.0)
Hemoglobin: 12.3 g/dL (ref 12.0–15.0)
Immature Granulocytes: 1 %
Lymphocytes Relative: 11 %
Lymphs Abs: 0.6 10*3/uL — ABNORMAL LOW (ref 0.7–4.0)
MCH: 31.6 pg (ref 26.0–34.0)
MCHC: 35.5 g/dL (ref 30.0–36.0)
MCV: 88.9 fL (ref 78.0–100.0)
Monocytes Absolute: 0.7 10*3/uL (ref 0.1–1.0)
Monocytes Relative: 12 %
Neutro Abs: 4.3 10*3/uL (ref 1.7–7.7)
Neutrophils Relative %: 76 %
Platelets: 165 10*3/uL (ref 150–400)
RBC: 3.89 MIL/uL (ref 3.87–5.11)
RDW: 15.3 % (ref 11.5–15.5)
WBC: 5.6 10*3/uL (ref 4.0–10.5)

## 2018-06-17 LAB — GLUCOSE, CAPILLARY
GLUCOSE-CAPILLARY: 150 mg/dL — AB (ref 70–99)
GLUCOSE-CAPILLARY: 117 mg/dL — AB (ref 70–99)
Glucose-Capillary: 129 mg/dL — ABNORMAL HIGH (ref 70–99)
Glucose-Capillary: 160 mg/dL — ABNORMAL HIGH (ref 70–99)

## 2018-06-17 LAB — RENAL FUNCTION PANEL
Albumin: 2.8 g/dL — ABNORMAL LOW (ref 3.5–5.0)
Anion gap: 14 (ref 5–15)
BUN: 110 mg/dL — ABNORMAL HIGH (ref 8–23)
CO2: 23 mmol/L (ref 22–32)
Calcium: 9.6 mg/dL (ref 8.9–10.3)
Chloride: 93 mmol/L — ABNORMAL LOW (ref 98–111)
Creatinine, Ser: 2.92 mg/dL — ABNORMAL HIGH (ref 0.44–1.00)
GFR calc Af Amer: 17 mL/min — ABNORMAL LOW (ref >60)
GFR calc non Af Amer: 15 mL/min — ABNORMAL LOW (ref >60)
Glucose, Bld: 132 mg/dL — ABNORMAL HIGH (ref 70–99)
Phosphorus: 5.3 mg/dL — ABNORMAL HIGH (ref 2.5–4.6)
Potassium: 4.5 mmol/L (ref 3.5–5.1)
Sodium: 130 mmol/L — ABNORMAL LOW (ref 135–145)

## 2018-06-17 LAB — MAGNESIUM: Magnesium: 2.9 mg/dL — ABNORMAL HIGH (ref 1.7–2.4)

## 2018-06-17 MED ORDER — SODIUM CHLORIDE 0.9 % IV SOLN
2.0000 g | Freq: Every day | INTRAVENOUS | Status: DC
  Administered 2018-06-18: 2 g via INTRAVENOUS
  Filled 2018-06-17: qty 20

## 2018-06-17 MED ORDER — SIMETHICONE 80 MG PO CHEW
80.0000 mg | CHEWABLE_TABLET | Freq: Four times a day (QID) | ORAL | Status: DC | PRN
  Administered 2018-06-17 (×2): 80 mg via ORAL
  Filled 2018-06-17 (×2): qty 1

## 2018-06-17 NOTE — Progress Notes (Addendum)
[facility] TEAM 1 - Stepdown/ICU TEAM  Mandy Rhodes  NGE:[id number] DOB: January 27, 1941 DOA: 06/10/2018 PCP: Harlan Stains, MD    Brief Narrative:  77yo female, with a hx of coronary artery disease, infiltrative cardiomyopathy with EF of 20-25%, CKD stage IV, and query myelodysplastic syndrome who was admitted with worsening shortness of breath, significant leg edema, orthopnea, and chest tightness.  The patient was hypotensive on presentation.  On presentation, sodium was 129, BUN 109, serum creatinine 2.62.  Significant Events: 7/26 admit 7/28 developed hypothermia   Subjective: Reports that she is feeling somewhat better at the time of visit.  Denies chest pain shortness of breath nausea vomiting or abdominal pain.  Assessment & Plan:  Acute on chronic systolic CHF Care as per CHF Team - TTE 06/16/18 noted EF 30-35%, severe concentric hypertrophy, moderate diffuse HK, moderate MR, LA severely dilated, RV moderately reduced, RA severely dilated, moderate TR, mild PI, PA peak pressure 39 mmHg, trivial pericardial effusion  CKD stage IV crt 2.64 05/26/18  Hypothermia w/ Gram negative rod UTI Increase rocephin to 2g dosing - follow culture data   Elevated troponin Care per cardiology  New onset paroxysmal atrial fibrillation Care per cardiology  DVT prophylaxis: eliquis  Code Status: FULL CODE Family Communication: no family present at time of exam  Disposition Plan:   Consultants:  Cardiology - CHF Team   Antimicrobials:  Rocephin 7/28 >  Objective: Blood pressure (!) 118/26, pulse 100, temperature (!) 96.4 F (35.8 C), temperature source Rectal, resp. rate (!) 27, height 5\' 7"  (1.702 m), weight 81.9 kg (180 lb 8.9 oz), SpO2 94 %.  Intake/Output Summary (Last 24 hours) at 06/17/2018 1148 Last data filed at 06/17/2018 1100 Gross per 24 hour  Intake 513 ml  Output 350 ml  Net 163 ml   Filed Weights   06/15/18 0340 06/16/18 0421 06/17/18 0401  Weight: 82.6 kg (182 lb  1.6 oz) 81.6 kg (179 lb 14.3 oz) 81.9 kg (180 lb 8.9 oz)    Examination: General: No acute respiratory distress Lungs: Clear to auscultation bilaterally without wheezes or crackles Cardiovascular: Regular rate and rhythm without murmur gallop or rub normal S1 and S2 Abdomen: Nontender, nondistended, soft, bowel sounds positive, no rebound, no ascites, no appreciable mass Extremities: 2+ B LE edema   CBC: Recent Labs  Lab 06/18/2018 2159 06/16/18 1032 06/17/18 0444  WBC 6.3 7.2 5.6  NEUTROABS  --  6.1 4.3  HGB 12.4 12.2 12.3  HCT 36.1 35.3* 34.6*  MCV 91.2 90.3 88.9  PLT 162 153 401   Basic Metabolic Panel: Recent Labs  Lab 06/15/18 0034 06/16/18 0656 06/17/18 0444  NA 129* 128* 130*  K 4.4 4.7 4.5  CL 93* 93* 93*  CO2 21* 23 23  GLUCOSE 134* 126* 132*  BUN 109* 110* 110*  CREATININE 2.62* 2.83* 2.92*  CALCIUM 9.7 9.6 9.6  MG  --   --  2.9*  PHOS  --   --  5.3*   GFR: Estimated Creatinine Clearance: 18 mL/min (A) (by C-G formula based on SCr of 2.92 mg/dL (H)).  Liver Function Tests: Recent Labs  Lab 06/15/18 0034 06/17/18 0444  AST 19  --   ALT 20  --   ALKPHOS 134*  --   BILITOT 1.5*  --   PROT 7.2  --   ALBUMIN 3.1* 2.8*    Cardiac Enzymes: Recent Labs  Lab 06/15/18 0358  TROPONINI 0.27*    HbA1C: Hemoglobin A1C  Date/Time Value Ref  Range Status  04/03/2017 6.4  Final   Hgb A1c MFr Bld  Date/Time Value Ref Range Status  10/13/2016 05:34 AM 7.1 (H) 4.8 - 5.6 % Final    Comment:    (NOTE)         Pre-diabetes: 5.7 - 6.4         Diabetes: >6.4         Glycemic control for adults with diabetes: <7.0   03/18/2016 06:26 AM 6.9 (H) 4.8 - 5.6 % Final    Comment:    (NOTE)         Pre-diabetes: 5.7 - 6.4         Diabetes: >6.4         Glycemic control for adults with diabetes: <7.0     CBG: Recent Labs  Lab 06/16/18 0801 06/16/18 1112 06/16/18 1601 06/16/18 2125 06/17/18 0800  GLUCAP 118* 126* 138* 141* 129*    Recent Results  (from the past 240 hour(s))  MRSA PCR Screening     Status: None   Collection Time: 06/15/18  3:44 AM  Result Value Ref Range Status   MRSA by PCR NEGATIVE NEGATIVE Final    Comment:        The GeneXpert MRSA Assay (FDA approved for NASAL specimens only), is one component of a comprehensive MRSA colonization surveillance program. It is not intended to diagnose MRSA infection nor to guide or monitor treatment for MRSA infections. Performed at [facility], [place] [address], [place], [place] [address]   Culture, Urine     Status: Abnormal (Preliminary result)   Collection Time: 06/16/18  8:29 AM  Result Value Ref Range Status   Specimen Description URINE, CATHETERIZED  Final   Special Requests   Final    NONE Performed at [facility], [place] [address], [place], [place] [address]    Culture >=100,000 COLONIES/mL GRAM NEGATIVE RODS (A)  Final   Report Status PENDING  Incomplete     Scheduled Meds: . apixaban  5 mg Oral BID  . furosemide  80 mg Intravenous Q12H  . insulin aspart  0-5 Units Subcutaneous QHS  . insulin aspart  0-9 Units Subcutaneous TID WC  . midodrine  10 mg Oral TID WC  . multivitamin with minerals  1 tablet Oral Daily  . pantoprazole  40 mg Oral Daily  . sodium chloride flush  3 mL Intravenous Q12H   Continuous Infusions: . sodium chloride 250 mL (06/17/18 0852)  . cefTRIAXone (ROCEPHIN)  IV 1 g (06/17/18 0854)     LOS: 2 days   Cherene Altes, MD [facility] Office  [phone] Pager - Text Page per Amion  If 7PM-7AM, please contact night-coverage per Amion 06/17/2018, 11:48 AM

## 2018-06-17 NOTE — Progress Notes (Signed)
Removed warming blanket at 1400 per 36.8 temp. Pt's temp is frequently monitored, and has continued to drop. Dr. Haroldine Laws was notified. Replacing warming blanket per Dr. Garen Lah order's.   Stanford Breed, RN

## 2018-06-17 NOTE — Progress Notes (Addendum)
.    Advanced Heart Failure Rounding Note   Subjective:    Remains on 80 mg IV lasix BID. Minimal UOP charted (has purewick). Weight up 1 lb. SBP 100-110s with midodrine.  Remains in AF 80-90s. Creatinine trending up. 2.92 this am. BUN 110.  Feels worse today. Very weak. More SOB and orthopneic. Denies bleeding. She has a nonproductive cough.  Temp has been low, most recently 96. On bear hugger. Ucx with GNR. BCx NGTD  Echo 06/16/18: EF 30-35%, severe concentric hypertrophy, moderate diffuse HK, moderate MR, LA severely dilated, RV moderately reduced, RA severely dilated, moderate TR, mild PI, PA peak pressure 39 mmHg, trivial pericardial effusion  Objective:   Weight Range:  Vital Signs:   Temp:  [95 F (35 C)-97.9 F (36.6 C)] 95.3 F (35.2 C) (07/29 0721) Pulse Rate:  [66-100] 100 (07/29 0721) Resp:  [14-34] 27 (07/29 0721) BP: (107-118)/(26-99) 118/26 (07/29 0721) SpO2:  [94 %-96 %] 94 % (07/29 0721) Weight:  [180 lb 8.9 oz (81.9 kg)] 180 lb 8.9 oz (81.9 kg) (07/29 0401) Last BM Date: 06/15/18  Weight change: Filed Weights   06/15/18 0340 06/16/18 0421 06/17/18 0401  Weight: 182 lb 1.6 oz (82.6 kg) 179 lb 14.3 oz (81.6 kg) 180 lb 8.9 oz (81.9 kg)    Intake/Output:   Intake/Output Summary (Last 24 hours) at 06/17/2018 0943 Last data filed at 06/17/2018 0700 Gross per 24 hour  Intake 213 ml  Output 350 ml  Net -137 ml     Physical Exam: General: Elderly appearing. Appears fatigued. No resp difficulty. HEENT: Normal anicteric Neck: Supple. JVP to ear. Carotids 2+ bilat; no bruits. No thyromegaly or nodule noted. Cor: PMI nondisplaced. IRR, 2/6 TR Lungs: diminished throughout no wheeze Abdomen: Soft, non-tender, non-distended, no HSM. No bruits or masses. +BS  Extremities: No cyanosis, clubbing, or rash. R and LLE 2+ edema cool Neuro: Alert & orientedx3, cranial nerves grossly intact. moves all 4 extremities w/o difficulty. Affect flat  Telemetry: Afib  80-90s with PVCs. Personally reviewed.    Labs: Basic Metabolic Panel: Recent Labs  Lab 06/13/18 1145 06/15/18 0034 06/16/18 0656 06/17/18 0444  NA 133* 129* 128* 130*  K 4.4 4.4 4.7 4.5  CL 91* 93* 93* 93*  CO2 22 21* 23 23  GLUCOSE 122* 134* 126* 132*  BUN 95* 109* 110* 110*  CREATININE 2.48* 2.62* 2.83* 2.92*  CALCIUM 9.8 9.7 9.6 9.6  MG  --   --   --  2.9*  PHOS  --   --   --  5.3*    Liver Function Tests: Recent Labs  Lab 06/15/18 0034 06/17/18 0444  AST 19  --   ALT 20  --   ALKPHOS 134*  --   BILITOT 1.5*  --   PROT 7.2  --   ALBUMIN 3.1* 2.8*   No results for input(s): LIPASE, AMYLASE in the last 168 hours. No results for input(s): AMMONIA in the last 168 hours.  CBC: Recent Labs  Lab 06/13/18 1145 06/11/2018 2159 06/16/18 1032 06/17/18 0444  WBC 6.6 6.3 7.2 5.6  NEUTROABS  --   --  6.1 4.3  HGB 11.3 12.4 12.2 12.3  HCT 34.8 36.1 35.3* 34.6*  MCV 94 91.2 90.3 88.9  PLT 160 162 153 165    Cardiac Enzymes: Recent Labs  Lab 06/15/18 0358  TROPONINI 0.27*    BNP: BNP (last 3 results) Recent Labs    11/07/17 1353 05/26/18 0845 06/03/2018 2159  BNP  1,122.8* 1,289.3* 660.1*    ProBNP (last 3 results) Recent Labs    06/13/18 1145  PROBNP 23,467*      Other results:  Imaging: No results found.   Medications:     Scheduled Medications: . apixaban  5 mg Oral BID  . furosemide  80 mg Intravenous Q12H  . insulin aspart  0-5 Units Subcutaneous QHS  . insulin aspart  0-9 Units Subcutaneous TID WC  . midodrine  10 mg Oral TID WC  . multivitamin with minerals  1 tablet Oral Daily  . pantoprazole  40 mg Oral Daily  . sodium chloride flush  3 mL Intravenous Q12H    Infusions: . sodium chloride 250 mL (06/17/18 0852)  . cefTRIAXone (ROCEPHIN)  IV 1 g (06/17/18 0854)    PRN Medications: sodium chloride, acetaminophen, alum & mag hydroxide-simeth, diphenhydrAMINE, ondansetron (ZOFRAN) IV, simethicone, sodium chloride  flush   Assessment:   77 y/o woman with h/o HTN, CKD 4 (baseline creatinine 2.5-2.9), DM2, myeloproliferative d/o and chronic systolic HF due to NICM (cath 2017 minimal CAD) admitted with recurrent HF and hypotension in setting of possible cardiac amyloidosis   Plan/Discussion:     1. Acute on chronic systolic HF - Echo 41/28 EF 25-30% Severe LVH with starry night pattern. RV moderate HK + pericardial effusion. Personally reviewed - Echo 06/16/18: EF 30-35%, severe concentric hypertrophy, moderate diffuse HK, moderate MR, LA severely dilated, RV moderately reduced, RA severely dilated, moderate TR, mild PI, PA peak pressure 39 mmHg, trivial pericardial effusion - Volume remains elevated in setting of significant biventricular HF. I worry she may be low output. Will discuss further with Dr Haroldine Laws  - Poor diuresis with IV lasix. Creatinine up slightly. Will continue IV diuresis but with RV failure and renal failure this may be as good as we can get her. BP improved with midodrine. Now 100-110s - In looking at echo she clearly has cardiac amyloidosis. The question is whether this is AL amyloid (myeloma) or TTR amyloid. Based on previous w/u and presence of renal failure I favor AL but she has had polyclonal gammopathy. - Myeloma panel pending. Will order UPEP. Consider skeletal survey and PYP scan.  - Dr Haroldine Laws has sent a message to [place] to ask for his input. Will follow up on this.  - Not b-blocker, ACE/ARB with hypotension and CKD 4 - Refuses TED hose - With cardiac amyloid, severe LV dysfunction and CKD 4 prognosis quite poor. Will need to broach EOL discussions with her and family. Agreeable to palliative consult, but states that her goal is to get better.   2. Paroxysmal Atrial fibrillation - new onset  - This is new diagnosis. Rate is controlled. Well tolerated - Continue Eliquis 5 bid. No longer on ASA. Denies bleeding.   3. CKD 4 - Creatinine trending up with diuresis.  With severe biventricular dysfunction likely not candidate for HD - Followed by Dr. Joelyn Oms in Nephrology  4. Chest tightness with mildly elevated troponin - Cath 2017 with minimal CAD - Doubt ACS. Likely due to HF and cardiac strain. No change.   5. UTI - per primary team. Being treated with Rocephin - Temps low in 95-96. Currently has bear hugger on.    Length of Stay: [place], NP 06/17/2018, 9:43 AM  Advanced Heart Failure Team Pager [phone] (M-F; 7a - 4p)  Please contact [facility] for night-coverage after hours (4p -7a ) and weekends on [email or web address]  Patient seen and examined  with the above-signed Advanced Practice Provider and/or Housestaff. I personally reviewed laboratory data, imaging studies and relevant notes. I independently examined the patient and formulated the important aspects of the plan. I have edited the note to reflect any of my changes or salient points. I have personally discussed the plan with the patient and/or family.  Multiple issues. I am concerned that she may be terminally ill. Echo shows clear cardiac amyloid. I reviewed recent heme w/u with Dr. Beryle Beams and he agrees that this is NOT myeloma. 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 Situation now complicated by worsening renal failure in setting of GNR UTI/sepsis with refractory hypothermia and new onset AF. I am not sure she can survive this and even if she gets through the acute phase of her illness she has advanced HF and CKD in setting of cardiac amyloidosis and prognosis is likely poor. I told her that I was very concerned about her and we discussed code status and she wants to remain Full Code for now.  We will see how she does through the night. Palliative Care Consult has been placed.   Glori Bickers, MD  9:53 PM

## 2018-06-17 NOTE — Progress Notes (Signed)
Patient's rectal temp is 95.4, we have put her back on the bear hugger and will recheck temp in one hour.   Patient was educated about the reason she needed to placed on the warming blanket again . I will keep monitoring patient.

## 2018-06-18 DIAGNOSIS — Z66 Do not resuscitate: Secondary | ICD-10-CM

## 2018-06-18 DIAGNOSIS — Z515 Encounter for palliative care: Secondary | ICD-10-CM

## 2018-06-18 DIAGNOSIS — R52 Pain, unspecified: Secondary | ICD-10-CM

## 2018-06-18 DIAGNOSIS — F411 Generalized anxiety disorder: Secondary | ICD-10-CM

## 2018-06-18 LAB — CBC
HCT: 35.7 % — ABNORMAL LOW (ref 36.0–46.0)
Hemoglobin: 12.1 g/dL (ref 12.0–15.0)
MCH: 30.7 pg (ref 26.0–34.0)
MCHC: 33.9 g/dL (ref 30.0–36.0)
MCV: 90.6 fL (ref 78.0–100.0)
PLATELETS: 172 10*3/uL (ref 150–400)
RBC: 3.94 MIL/uL (ref 3.87–5.11)
RDW: 15.3 % (ref 11.5–15.5)
WBC: 5.4 10*3/uL (ref 4.0–10.5)

## 2018-06-18 LAB — HEPATIC FUNCTION PANEL
ALBUMIN: 3 g/dL — AB (ref 3.5–5.0)
ALK PHOS: 152 U/L — AB (ref 38–126)
ALT: 22 U/L (ref 0–44)
AST: 26 U/L (ref 15–41)
BILIRUBIN DIRECT: 0.5 mg/dL — AB (ref 0.0–0.2)
Indirect Bilirubin: 0.5 mg/dL (ref 0.3–0.9)
Total Bilirubin: 1 mg/dL (ref 0.3–1.2)
Total Protein: 7.4 g/dL (ref 6.5–8.1)

## 2018-06-18 LAB — BASIC METABOLIC PANEL
Anion gap: 12 (ref 5–15)
BUN: 115 mg/dL — AB (ref 8–23)
CALCIUM: 9.5 mg/dL (ref 8.9–10.3)
CO2: 23 mmol/L (ref 22–32)
Chloride: 93 mmol/L — ABNORMAL LOW (ref 98–111)
Creatinine, Ser: 3.26 mg/dL — ABNORMAL HIGH (ref 0.44–1.00)
GFR calc Af Amer: 15 mL/min — ABNORMAL LOW (ref >60)
GFR calc non Af Amer: 13 mL/min — ABNORMAL LOW (ref >60)
Glucose, Bld: 134 mg/dL — ABNORMAL HIGH (ref 70–99)
POTASSIUM: 4.8 mmol/L (ref 3.5–5.1)
SODIUM: 128 mmol/L — AB (ref 135–145)

## 2018-06-18 LAB — GLUCOSE, CAPILLARY: GLUCOSE-CAPILLARY: 122 mg/dL — AB (ref 70–99)

## 2018-06-18 MED ORDER — HYDROMORPHONE HCL 1 MG/ML IJ SOLN
1.0000 mg | INTRAMUSCULAR | Status: DC | PRN
  Administered 2018-06-19 (×2): 1 mg via INTRAVENOUS
  Filled 2018-06-18 (×2): qty 1

## 2018-06-18 MED ORDER — GLYCOPYRROLATE 0.2 MG/ML IJ SOLN
0.1000 mg | INTRAMUSCULAR | Status: DC | PRN
  Administered 2018-06-18: 0.1 mg via INTRAVENOUS
  Filled 2018-06-18: qty 1

## 2018-06-18 MED ORDER — LORAZEPAM 2 MG/ML IJ SOLN
1.0000 mg | INTRAMUSCULAR | Status: DC | PRN
  Administered 2018-06-18: 1 mg via INTRAVENOUS
  Filled 2018-06-18: qty 1

## 2018-06-18 MED ORDER — MORPHINE SULFATE (PF) 2 MG/ML IV SOLN
0.5000 mg | INTRAVENOUS | Status: DC | PRN
  Administered 2018-06-18: 0.5 mg via INTRAVENOUS
  Filled 2018-06-18: qty 1

## 2018-06-18 MED ORDER — MORPHINE SULFATE (PF) 2 MG/ML IV SOLN
1.0000 mg | INTRAVENOUS | Status: DC | PRN
  Administered 2018-06-18: 2 mg via INTRAVENOUS
  Filled 2018-06-18: qty 1

## 2018-06-18 MED ORDER — ORAL CARE MOUTH RINSE
15.0000 mL | Freq: Two times a day (BID) | OROMUCOSAL | Status: DC
  Administered 2018-06-18: 15 mL via OROMUCOSAL

## 2018-06-18 NOTE — Progress Notes (Signed)
Patient was placed in bear hugger due to temp down to 36.2 (97.2) degrees.  Need to keep patient >98 deg. Per MD orders.   I will keep monitoring patient.

## 2018-06-18 NOTE — Progress Notes (Addendum)
.    Advanced Heart Failure Rounding Note   Subjective:    Sluggish UOP on 80 mg IV lasix BID. Weight unchanged. SBP 88-100s.   Remains in AF 90s. Creatinine continues to worsen 3.26 this am. BUN 115. Na 128  She feels poorly this morning. She is SOB. + orthopnea. Weak.  Denies any pain. Says she is "ready to go". Family is coming in later today.   Remains hypothermic. Temp ~97. Using bear hugger on and off. Ucx with GNR. BCx NGTD. WBC 5.4  Echo 06/16/18: EF 30-35%, severe concentric hypertrophy, moderate diffuse HK, moderate MR, LA severely dilated, RV moderately reduced, RA severely dilated, moderate TR, mild PI, PA peak pressure 39 mmHg, trivial pericardial effusion  Objective:   Weight Range:  Vital Signs:   Temp:  [96.4 F (35.8 C)-98.4 F (36.9 C)] 97.9 F (36.6 C) (07/30 0821) Pulse Rate:  [44-114] 108 (07/30 0628) Resp:  [18-30] 22 (07/30 0750) BP: (88-118)/(66-91) 108/81 (07/30 0750) SpO2:  [92 %-97 %] 93 % (07/30 0750) Weight:  [180 lb 12.4 oz (82 kg)] 180 lb 12.4 oz (82 kg) (07/30 0628) Last BM Date: 06/17/18  Weight change: Filed Weights   06/16/18 0421 06/17/18 0401 06/18/18 0628  Weight: 179 lb 14.3 oz (81.6 kg) 180 lb 8.9 oz (81.9 kg) 180 lb 12.4 oz (82 kg)    Intake/Output:   Intake/Output Summary (Last 24 hours) at 06/18/2018 0836 Last data filed at 06/18/2018 0659 Gross per 24 hour  Intake 544.66 ml  Output 351 ml  Net 193.66 ml     Physical Exam: General: Elderly appearing. Appears fatigued. Drowsy. HEENT: Normal anicteric  Neck: Supple. JVP to ear. Carotids 2+ bilat; no bruits. No thyromegaly or nodule noted. Cor: PMI nondisplaced. IRR, 2/6 TR Lungs: diminished throughout no wheeze Abdomen: Soft, non-tender, non-distended, no HSM. No bruits or masses. +BS  Extremities: No cyanosis, clubbing, or rash. R and LLE 2+ edema.  Neuro: Alert & orientedx3, cranial nerves grossly intact. moves all 4 extremities w/o difficulty. Affect  flat   Telemetry: Afib 90s. Personally reviewed.    Labs: Basic Metabolic Panel: Recent Labs  Lab 06/13/18 1145 06/15/18 0034 06/16/18 0656 06/17/18 0444 06/18/18 0455  NA 133* 129* 128* 130* 128*  K 4.4 4.4 4.7 4.5 4.8  CL 91* 93* 93* 93* 93*  CO2 22 21* 23 23 23   GLUCOSE 122* 134* 126* 132* 134*  BUN 95* 109* 110* 110* 115*  CREATININE 2.48* 2.62* 2.83* 2.92* 3.26*  CALCIUM 9.8 9.7 9.6 9.6 9.5  MG  --   --   --  2.9*  --   PHOS  --   --   --  5.3*  --     Liver Function Tests: Recent Labs  Lab 06/15/18 0034 06/17/18 0444 06/18/18 0455  AST 19  --  26  ALT 20  --  22  ALKPHOS 134*  --  152*  BILITOT 1.5*  --  1.0  PROT 7.2  --  7.4  ALBUMIN 3.1* 2.8* 3.0*   No results for input(s): LIPASE, AMYLASE in the last 168 hours. No results for input(s): AMMONIA in the last 168 hours.  CBC: Recent Labs  Lab 06/13/18 1145 06/11/2018 2159 06/16/18 1032 06/17/18 0444 06/18/18 0455  WBC 6.6 6.3 7.2 5.6 5.4  NEUTROABS  --   --  6.1 4.3  --   HGB 11.3 12.4 12.2 12.3 12.1  HCT 34.8 36.1 35.3* 34.6* 35.7*  MCV 94 91.2 90.3 88.9 90.6  PLT 160 162 153 165 172    Cardiac Enzymes: Recent Labs  Lab 06/15/18 0358  TROPONINI 0.27*    BNP: BNP (last 3 results) Recent Labs    11/07/17 1353 05/26/18 0845 06/17/2018 2159  BNP 1,122.8* 1,289.3* 660.1*    ProBNP (last 3 results) Recent Labs    06/13/18 1145  PROBNP 23,467*      Other results:  Imaging: No results found.   Medications:     Scheduled Medications: . apixaban  5 mg Oral BID  . furosemide  80 mg Intravenous Q12H  . insulin aspart  0-5 Units Subcutaneous QHS  . insulin aspart  0-9 Units Subcutaneous TID WC  . midodrine  10 mg Oral TID WC  . multivitamin with minerals  1 tablet Oral Daily  . pantoprazole  40 mg Oral Daily  . sodium chloride flush  3 mL Intravenous Q12H    Infusions: . sodium chloride 250 mL (06/18/18 0827)  . cefTRIAXone (ROCEPHIN)  IV 2 g (06/18/18 0833)    PRN  Medications: sodium chloride, acetaminophen, alum & mag hydroxide-simeth, diphenhydrAMINE, ondansetron (ZOFRAN) IV, simethicone, sodium chloride flush   Assessment:   77 y/o woman with h/o HTN, CKD 4 (baseline creatinine 2.5-2.9), DM2, myeloproliferative d/o and chronic systolic HF due to NICM (cath 2017 minimal CAD) admitted with recurrent HF and hypotension in setting of possible cardiac amyloidosis   Plan/Discussion:     1. Acute on chronic systolic HF - Echo 93/71 EF 25-30% Severe LVH with starry night pattern. RV moderate HK + pericardial effusion. Personally reviewed - Echo 06/16/18: EF 30-35%, severe concentric hypertrophy, moderate diffuse HK, moderate MR, LA severely dilated, RV moderately reduced, RA severely dilated, moderate TR, mild PI, PA peak pressure 39 mmHg, trivial pericardial effusion - Volume remains elevated in setting of significant biventricular HF. - Poor diuresis with IV lasix. Creatinine continues to trend up. Will continue IV diuresis for now. SBP 88-110s. Hold off on increasing milrinone  - In looking at echo she clearly has cardiac amyloidosis. 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  - SPEP/UPEP pending. - Not b-blocker, ACE/ARB with hypotension and CKD 4 - Refuses TED hose - With cardiac amyloid, severe LV dysfunction and CKD 4 prognosis quite poor. Will need to broach EOL discussions with her and family. - Palliative meeting at 10 am this morning with son.   2. Paroxysmal Atrial fibrillation - new onset  - This is new diagnosis. Rate is relatively controlled.  - Continue Eliquis 5 bid. No longer on ASA. Denies bleeding.   3. CKD 4 - Creatinine continues to worsen. Now 3.26 with BUN 115 this am. With severe biventricular dysfunction likely not candidate for HD - Followed by Dr. Joelyn Oms in Nephrology  4. Chest tightness with mildly elevated troponin - Cath 2017 with minimal  CAD - Doubt ACS. Likely due to HF and cardiac strain. No change.   5. UTI - Ucx with GNR. BCx NGTD. WBC 5.4 - per primary team. Being treated with Rocephin - Temps ~97. Using bear hugger intermittently   Length of Stay: [place], NP 06/18/2018, 8:36 AM  Advanced Heart Failure Team Pager [phone] (M-F; 7a - 4p)  Please contact [facility] for night-coverage after hours (4p -7a ) and weekends on [email or web address]  Patient seen and examined with the above-signed Advanced Practice Provider and/or Housestaff. I personally reviewed laboratory data, imaging studies and relevant notes. I independently  examined the patient and formulated the important aspects of the plan. I have edited the note to reflect any of my changes or salient points. I have personally discussed the plan with the patient and/or family.  She has progressive multi-system organ failure with refractory hypothermia. Renal function worse. Now with GNR infection with advanced HF and A/CKD in setting of cardiac amyloidosis prognosis is likely poor. I do not think she will survive this hospitalization. Will await Palliative Care Consult today.   Glori Bickers, MD  9:23 AM   Addendum:  Patient now comfort care.   Heart failure team will sign off as of 06/18/18  Please call with any questions.   HF Medication Recommendations for Home: n/a  Other recommendations (Labs,testing, etc): n/a  Follow up as an outpatient: n/a  Glori Bickers, MD  2:36 PM

## 2018-06-18 NOTE — Consult Note (Signed)
Consultation Note Date: 06/18/2018   Patient Name: Mandy Rhodes  DOB: November 05, 1941  MRN: [id number]  Age / Sex: 77 y.o., female  PCP: Harlan Stains, MD Referring Physician: Cherene Altes, MD  Reason for Consultation: Establishing goals of care and Psychosocial/spiritual support  HPI/Patient Profile: 77 y.o. female   admitted on 05/21/2018 with past  medical history significant for chronic kidney disease stage IV, chronic combined systolic/diastolic CHF, myelodysplastic syndrome who  presented to the emergency department for evaluation of shortness of breath, increased bilateral leg edema, orthopnea, and chest tightness.  Echo 06/16/18: EF 30-35%, severe concentric hypertrophy, moderate diffuse HK, moderate MR, LA severely dilated, RV moderately reduced, RA severely dilated, moderate TR, mild PI, PA peak pressure 39 mmHg, trivial pericardial effusion  Per family patient has had continued physical and functional decline over the past many months.    Patient and her family face treatment option decisions,advanced directive decisions and anticipatory care needs.  Clinical Assessment and Goals of Care:  This NP Wadie Lessen reviewed medical records, received report from team, assessed the patient and then meet at the patient's bedside along with her son/Danny and god-daughter/Melba  to discuss diagnosis, prognosis, GOC, EOL wishes disposition and options.  Concept of Hospice and Palliative Care were discussed  A  discussion was had today regarding advanced directives.  Concepts specific to code status was had.  The difference between a aggressive medical intervention path  and a palliative comfort care path for this patient at this time was had.  Values and goals of care important to patient and family were attempted to be elicited.   Patient was able to communicate to her son her desire to shift focus of  care to comfort and allow for a natural death.  She tells him, "I'm ready to go"   He verbalizes an understanding and supports her decision.  Natural trajectory and expectations at EOL were discussed.  Questions and concerns addressed.   Family encouraged to call with questions or concerns.    PMT will continue to support holistically.   HCPOA/ son/ Katheren Shams    SUMMARY OF RECOMMENDATIONS    Code Status/Advance Care Planning:  DNRdocumented today   Symptom Management:   Pain/Dyspnea: Dilaudid 1 mg IV every 1 hr prn   Anxiety: Ativan 1 mg IV every 4 hrs prn  Palliative Prophylaxis:   Bowel Regimen, Frequent Pain Assessment and Oral Care  Additional Recommendations (Limitations, Scope, Preferences):  Full Comfort Care  Psycho-social/Spiritual:   Desire for further Chaplaincy support:yes- ordered consult  Additional Recommendations: Education on Hospice  Prognosis:   Hours - Days  Discharge Planning: To Be Determined      Primary Diagnoses: Present on Admission: . CKD (chronic kidney disease) stage 4, GFR 15-29 ml/min ([place]) . Acute on chronic combined systolic and diastolic CHF  . Hyponatremia . Elevated troponin I level . A-fib [facility])   I have reviewed the medical record, interviewed the patient and family, and examined the patient. The following aspects are pertinent.  Past  Medical History:  Diagnosis Date  . Anemia   . Arthritis   . CAD (coronary artery disease)    a. minimal CAD by cath in 03/2016  . CHF (congestive heart failure) ([facility])    a. 09/2016: Echo with EF of 30-35%, moderate diffuse HK, Grade 2 DD, PA peak pressure of 39 mm Hg.  Marland Kitchen Chronic kidney disease    Renal Insuffiency, see [facility] once a year  . Diabetes mellitus    type 2  . GERD (gastroesophageal reflux disease)   . History of rotator cuff tear    Torn right rotator cuff.  . History of spinal stenosis   . Hypertension   . Infiltrative cardiomyopathy ([place])    a,  presumed cardiac amyloidosis by MRI with prior nondiagnostic fat pad biopsy  . Neuropathy associated with endocrine disorder [facility])    Social History   Socioeconomic History  . Marital status: Divorced    Spouse name: Not on file  . Number of children: Not on file  . Years of education: Not on file  . Highest education level: Not on file  Occupational History  . Occupation: retired  Scientific laboratory technician  . Financial resource strain: Not on file  . Food insecurity:    Worry: Not on file    Inability: Not on file  . Transportation needs:    Medical: Not on file    Non-medical: Not on file  Tobacco Use  . Smoking status: Former Smoker    Packs/day: 0.25    Years: 33.00    Pack years: 8.25    Types: Cigarettes  . Smokeless tobacco: Never Used  . Tobacco comment: 12/02/13- quit over 20 years ago/smoked 33 years on and off  Substance and Sexual Activity  . Alcohol use: No  . Drug use: No  . Sexual activity: Not Currently  Lifestyle  . Physical activity:    Days per week: Not on file    Minutes per session: Not on file  . Stress: Not on file  Relationships  . Social connections:    Talks on phone: Not on file    Gets together: Not on file    Attends religious service: Not on file    Active member of club or organization: Not on file    Attends meetings of clubs or organizations: Not on file    Relationship status: Not on file  Other Topics Concern  . Not on file  Social History Narrative  . Not on file   Family History  Problem Relation Age of Onset  . Diabetes type II Mother   . Hypertension Father   . Diabetes type II Sister   . Ovarian cancer Sister   . Diabetes type II Other   . Stroke Neg Hx   . CAD Neg Hx   . Heart failure Neg Hx    Scheduled Meds: . apixaban  5 mg Oral BID  . furosemide  80 mg Intravenous Q12H  . insulin aspart  0-5 Units Subcutaneous QHS  . insulin aspart  0-9 Units Subcutaneous TID WC  . midodrine  10 mg Oral TID WC  . multivitamin with  minerals  1 tablet Oral Daily  . pantoprazole  40 mg Oral Daily  . sodium chloride flush  3 mL Intravenous Q12H   Continuous Infusions: . sodium chloride 250 mL (06/18/18 0827)  . cefTRIAXone (ROCEPHIN)  IV 2 g (06/18/18 0833)   PRN Meds:.sodium chloride, acetaminophen, alum & mag hydroxide-simeth, diphenhydrAMINE, ondansetron (ZOFRAN)  IV, simethicone, sodium chloride flush Medications Prior to Admission:  Prior to Admission medications   Medication Sig Start Date End Date Taking? Authorizing Provider  acetaminophen (TYLENOL) 325 MG tablet Take 650 mg by mouth every 6 (six) hours as needed for mild pain.   Yes [provider]  aspirin 81 MG EC tablet Chew 81 mg by mouth daily.    Yes [provider]  Calcium Carb-Cholecalciferol (CALCIUM 600 + D PO) Take 1 tablet by mouth daily.    Yes [provider]  ferrous sulfate 325 (65 FE) MG EC tablet Take 325 mg by mouth daily.    Yes [provider]  furosemide (LASIX) 80 MG tablet Take 160 mg by mouth 2 (two) times daily.   Yes [provider]  metoprolol succinate (TOPROL-XL) 25 MG 24 hr tablet Take 0.5 tablets (12.5 mg total) by mouth daily. 06/07/18  Yes Hilty, Nadean Corwin, MD  Multiple Vitamin (TAB-A-VITE) TABS Take 1 tablet by mouth daily.    Yes [provider]  omeprazole (PRILOSEC) 40 MG capsule Take 1 capsule (40 mg total) by mouth daily. 11/11/17  Yes Hongalgi, Lenis Dickinson, MD  potassium chloride 20 MEQ TBCR Take 20 mEq by mouth daily. 11/11/17  Yes Hongalgi, Lenis Dickinson, MD   Allergies  Allergen Reactions  . Sulfa Antibiotics Swelling   Review of Systems  Constitutional:       -generalized  "allover" pain  Psychiatric/Behavioral: The patient is nervous/anxious.     Physical Exam  Constitutional: She appears well-developed. She appears lethargic.  HENT:  Head: Normocephalic.  Cardiovascular: Tachycardia present.  Pulmonary/Chest: She has decreased breath sounds.  Neurological: She  appears lethargic.  Skin: Skin is warm and dry.    Vital Signs: BP 108/81 (BP Location: Left Arm)   Pulse (!) 108   Temp 97.9 F (36.6 C)   Resp (!) 22   Ht 5\' 7"  (1.702 m)   Wt 82 kg (180 lb 12.4 oz)   SpO2 93%   BMI 28.31 kg/m  Pain Scale: 0-10   Pain Score: 0-No pain   SpO2: SpO2: 93 % O2 Device:SpO2: 93 % O2 Flow Rate: .O2 Flow Rate (L/min): 2 L/min  IO: Intake/output summary:   Intake/Output Summary (Last 24 hours) at 06/18/2018 0936 Last data filed at 06/18/2018 8891 Gross per 24 hour  Intake 544.66 ml  Output 351 ml  Net 193.66 ml    LBM: Last BM Date: 06/17/18 Baseline Weight: Weight: 77.6 kg (171 lb) Most recent weight: Weight: 82 kg (180 lb 12.4 oz)      Palliative Assessment/Data: 20 % at best   Discussed with Dr Thereasa Solo      Time In: 1000 Time Out: 1115 Time Total: 75 minutes Greater than 50%  of this time was spent counseling and coordinating care related to the above assessment and plan.  Signed by: Wadie Lessen, NP   Please contact Palliative Medicine Team phone at [phone] for questions and concerns.  For individual provider: See Shea Evans

## 2018-06-18 NOTE — Progress Notes (Signed)
[place] TEAM 1 - Stepdown/ICU TEAM  Mandy Rhodes  FEO:[id number] DOB: 09/05/1941 DOA: 05/28/2018 PCP: Harlan Stains, MD    Brief Narrative:  77yo female, with a hx of coronary artery disease, infiltrative cardiomyopathy with EF of 20-25%, CKD stage IV, and query myelodysplastic syndrome who was admitted with worsening shortness of breath, significant leg edema, orthopnea, and chest tightness.  The patient was hypotensive on presentation.  On presentation, sodium was 129, BUN 109, serum creatinine 2.62.  Subjective: Palliative Care met with the patient and her family today.  She and her family have decided to persue comfort focused care only.  As per CHF Team "She has progressive multi-system organ failure with refractory hypothermia. Renal function worse. Now with GNR infection with advanced HF and A/CKD in setting of cardiac amyloidosis prognosis is likely poor. I do not think she will survive this hospitalization."  She has been seen by both the CHF Team and Palliative Care today.  I have opted to not disturb her.  TR[facility] remains as attending and will f/u 7/31.    Assessment & Plan:  Acute on chronic systolic CHF - cardiac amyloidosis  TTE 06/16/18 noted EF 30-35%, severe concentric hypertrophy, moderate diffuse HK, moderate MR, LA severely dilated, RV moderately reduced, RA severely dilated, moderate TR, mild PI, PA peak pressure 39 mmHg, trivial pericardial effusion  CKD stage IV crt 2.64 05/26/18 - crt has progressively worsened during this admission  Recent Labs  Lab 06/13/18 1145 06/15/18 0034 06/16/18 0656 06/17/18 0444 06/18/18 0455  CREATININE 2.48* 2.62* 2.83* 2.92* 3.26*    Persistent Hypothermia w/ Gram negative rod UTI ([place] pneumo) Did not respond to abx tx - sensitivities pending   Elevated troponin  New onset paroxysmal atrial fibrillation  Code Status: DNR - NO CODE Family Communication: Disposition Plan: comfort focused care - anticipate hospital death    Consultants:  Cardiology - CHF Team  Palliative Care   Antimicrobials:  Rocephin 7/28 > 7/30  Objective: Blood pressure 108/81, pulse (!) 108, temperature (!) 97.5 F (36.4 C), resp. rate (!) 22, height '5\' 7"'  (1.702 m), weight 82 kg (180 lb 12.4 oz), SpO2 95 %.  Intake/Output Summary (Last 24 hours) at 06/18/2018 1501 Last data filed at 06/18/2018 0943 Gross per 24 hour  Intake 149.36 ml  Output 350 ml  Net -200.64 ml   Filed Weights   06/16/18 0421 06/17/18 0401 06/18/18 0628  Weight: 81.6 kg (179 lb 14.3 oz) 81.9 kg (180 lb 8.9 oz) 82 kg (180 lb 12.4 oz)    Examination: No exam today   CBC: Recent Labs  Lab 06/16/18 1032 06/17/18 0444 06/18/18 0455  WBC 7.2 5.6 5.4  NEUTROABS 6.1 4.3  --   HGB 12.2 12.3 12.1  HCT 35.3* 34.6* 35.7*  MCV 90.3 88.9 90.6  PLT 153 165 325   Basic Metabolic Panel: Recent Labs  Lab 06/16/18 0656 06/17/18 0444 06/18/18 0455  NA 128* 130* 128*  K 4.7 4.5 4.8  CL 93* 93* 93*  CO2 '23 23 23  ' GLUCOSE 126* 132* 134*  BUN 110* 110* 115*  CREATININE 2.83* 2.92* 3.26*  CALCIUM 9.6 9.6 9.5  MG  --  2.9*  --   PHOS  --  5.3*  --    GFR: Estimated Creatinine Clearance: 16.2 mL/min (A) (by C-G formula based on SCr of 3.26 mg/dL (H)).  Liver Function Tests: Recent Labs  Lab 06/15/18 0034 06/17/18 0444 06/18/18 0455  AST 19  --  26  ALT 20  --  22  ALKPHOS 134*  --  152*  BILITOT 1.5*  --  1.0  PROT 7.2  --  7.4  ALBUMIN 3.1* 2.8* 3.0*    Cardiac Enzymes: Recent Labs  Lab 06/15/18 0358  TROPONINI 0.27*    HbA1C: Hemoglobin A1C  Date/Time Value Ref Range Status  04/03/2017 6.4  Final   Hgb A1c MFr Bld  Date/Time Value Ref Range Status  10/13/2016 05:34 AM 7.1 (H) 4.8 - 5.6 % Final    Comment:    (NOTE)         Pre-diabetes: 5.7 - 6.4         Diabetes: >6.4         Glycemic control for adults with diabetes: <7.0   03/18/2016 06:26 AM 6.9 (H) 4.8 - 5.6 % Final    Comment:    (NOTE)         Pre-diabetes: 5.7 -  6.4         Diabetes: >6.4         Glycemic control for adults with diabetes: <7.0     CBG: Recent Labs  Lab 06/17/18 0800 06/17/18 1242 06/17/18 1605 06/17/18 2131 06/18/18 0749  GLUCAP 129* 150* 117* 160* 122*    Recent Results (from the past 240 hour(s))  MRSA PCR Screening     Status: None   Collection Time: 06/15/18  3:44 AM  Result Value Ref Range Status   MRSA by PCR NEGATIVE NEGATIVE Final    Comment:        The GeneXpert MRSA Assay (FDA approved for NASAL specimens only), is one component of a comprehensive MRSA colonization surveillance program. It is not intended to diagnose MRSA infection nor to guide or monitor treatment for MRSA infections. Performed at [facility], [place] [address] [place], [place] [address]   Culture, Urine     Status: Abnormal (Preliminary result)   Collection Time: 06/16/18  8:29 AM  Result Value Ref Range Status   Specimen Description URINE, CATHETERIZED  Final   Special Requests NONE  Final   Culture (A)  Final    >=100,000 COLONIES/mL KLEBSIELLA PNEUMONIAE SUSCEPTIBILITIES TO FOLLOW Performed at [facility], [place] [address] [place], [place] [address]    Report Status PENDING  Incomplete  Culture, blood (routine x 2)     Status: None (Preliminary result)   Collection Time: 06/16/18 11:04 AM  Result Value Ref Range Status   Specimen Description BLOOD RIGHT ARM  Final   Special Requests   Final    BOTTLES DRAWN AEROBIC AND ANAEROBIC Blood Culture results may not be optimal due to an inadequate volume of blood received in culture bottles   Culture   Final    NO GROWTH 1 DAY Performed at [facility], [place] [address], [place], [place] [address]    Report Status PENDING  Incomplete  Culture, blood (routine x 2)     Status: None (Preliminary result)   Collection Time: 06/16/18 11:04 AM  Result Value Ref Range Status   Specimen Description BLOOD RIGHT ARM  Final   Special Requests   Final    BOTTLES DRAWN  AEROBIC AND ANAEROBIC Blood Culture results may not be optimal due to an inadequate volume of blood received in culture bottles   Culture   Final    NO GROWTH 1 DAY Performed at [facility], [place] [address] [place], [place] [address]    Report Status  PENDING  Incomplete     Scheduled Meds: . furosemide  80 mg Intravenous Q12H  . sodium chloride flush  3 mL Intravenous Q12H   Continuous Infusions: . sodium chloride 10 mL/hr at 06/18/18 0943     LOS: 3 days   Cherene Altes, MD [facility] Office  [phone] Pager - Text Page per Shea Evans  If 7PM-7AM, please contact night-coverage per Amion 06/18/2018, 3:01 PM

## 2018-06-18 NOTE — Progress Notes (Signed)
Nutrition Brief Note  Chart reviewed due to low Braden score. Patient is transitioning to comfort focused care.  No nutrition interventions warranted at this time.  Please consult as needed.   Molli Barrows, RD, LDN, [place] Pager [phone] After Hours Pager [phone]

## 2018-06-18 NOTE — Progress Notes (Signed)
Visited with Ms. Vernica this evening.  She doesn't awaken at the call of her name, even loudly.  Son and goddaughter had just left before I arrived.  I said a prayer for her at bedside and quietly left her room.  Thankful for the staff that is caring for her at this time.    06/18/18 2020  Clinical Encounter Type  Visited With Patient;Health care provider  Visit Type Initial;Spiritual support  Spiritual Encounters  Spiritual Needs Prayer

## 2018-06-18 NOTE — Progress Notes (Signed)
Noted that patient's temperature is 97.5, however, when attempting to put on the bear hugger, the pt stated "just let me go," and refused care. Pt. Is alert and oriented x4. Family meeting with palliative care team is scheduled for 10am. For now, will leave bear hugger off and continue to monitor pt.   Stanford Breed, RN

## 2018-06-18 NOTE — Progress Notes (Signed)
Patient's temp 98.42 at this time, bear hugger has been taken off.  I will keep monitoring the patient.

## 2018-06-19 DIAGNOSIS — K117 Disturbances of salivary secretion: Secondary | ICD-10-CM

## 2018-06-19 LAB — MULTIPLE MYELOMA PANEL, SERUM
Albumin SerPl Elph-Mcnc: 3.1 g/dL (ref 2.9–4.4)
Albumin/Glob SerPl: 0.8 (ref 0.7–1.7)
Alpha 1: 0.4 g/dL (ref 0.0–0.4)
Alpha2 Glob SerPl Elph-Mcnc: 0.5 g/dL (ref 0.4–1.0)
B-Globulin SerPl Elph-Mcnc: 1.4 g/dL — ABNORMAL HIGH (ref 0.7–1.3)
GAMMA GLOB SERPL ELPH-MCNC: 1.6 g/dL (ref 0.4–1.8)
GLOBULIN, TOTAL: 3.9 g/dL (ref 2.2–3.9)
IgA: 527 mg/dL — ABNORMAL HIGH (ref 64–422)
IgG (Immunoglobin G), Serum: 1724 mg/dL — ABNORMAL HIGH (ref 700–1600)
IgM (Immunoglobulin M), Srm: 122 mg/dL (ref 26–217)
Total Protein ELP: 7 g/dL (ref 6.0–8.5)

## 2018-06-19 LAB — URINE CULTURE: Culture: >=100000 — AB

## 2018-06-19 MED ORDER — GLYCOPYRROLATE 0.2 MG/ML IJ SOLN: 0.4000 mg | INTRAMUSCULAR | Status: DC

## 2018-06-19 MED ORDER — HYDROMORPHONE HCL 1 MG/ML IJ SOLN: 1.0000 mg | Freq: Four times a day (QID) | INTRAMUSCULAR | Status: DC

## 2018-06-20 NOTE — Progress Notes (Signed)
Pt family called out stated that pt was saying her knee hurt and if pt could have some pain medication. Pt given pain medication as ordered at 0836, pt in bed resting pt is non verbal at this time. Family at bedside video chatting family members and calling family members on the phone so they can speak to pt.

## 2018-06-20 NOTE — Care Management Note (Addendum)
Case Management Note  Patient Details  Name: SANYIAH KANZLER MRN: [id number] Date of Birth: [birth year]  Subjective/Objective:   Patient presented with SOB, chest tightness, bilateral leg swelling. Patient lives at home with granddaughter; open to [facility] for PCS. At the time of visit, patient asked for CM to follow-up at a later time.                 Action/Plan: Benefits check for Eliquis 5mg  BID completed. CM provided patient with a Eliquis 30 day card. CM called [facility] with Eliquis 5mg  quantity of 60 tablets in stock. CM will continued to follow for additional discharge needs.    S/OSIE @ [employer] [facility] # [phone] OPT- 2   ELIQUIS 5 MG BID  COVER- YES  CO-PAY- ZERO DOLLARS  TIER- NO  PRIOR APPROVAL- NO   PREFERRED PHARMACY : YES  [facility]  [facility]    Expected Discharge Date:                  Expected Discharge Plan:  [place]  In-House Referral:  NA  Discharge planning Services  CM Consult, Medication Assistance  Post Acute Care Choice:  Home Health Choice offered to:   N/A  DME Arranged:   N/A DME Agency:   N/A  HH Arranged:   N/A HH Agency:   N/A  Status of Service:  Completed If discussed at Long Length of Stay Meetings, dates discussed:    Additional Comments: July 08, 2018 [place], BSN RNCM [phone] Patient expired on 07/08/2018 at 1208.  Jul 08, 2018 [place], BSN RNCM [phone] Patient is now comfort care with Palliative Care NP following and will continue to eval if patient is a candidate for residential hospice. CM will continue to follow for transitional needs.  Georgeanna Lea, RN 06/17/2018, 12:46 PM

## 2018-06-20 NOTE — Death Summary Note (Signed)
DEATH SUMMARY   Patient Details  Name: Mandy Rhodes MRN: [id number] DOB: [birth year]  Admission/Discharge Information   Admit Date:  07/02/2018  Date of Death: Date of Death: Jul 07, 2018  Time of Death: Time of Death: 02-10-1207  Length of Stay: 4  Referring Physician: Harlan Stains, MD   Reason(s) for Hospitalization   ACUTE ON CHRONIC COMBINED SYSTOLIC/ DIASTOLIC CHF  Diagnoses  Preliminary cause of death:  Secondary Diagnoses (including complications and co-morbidities):  Principal Problem:   Acute on chronic combined systolic and diastolic CHF  Active Problems:   CKD (chronic kidney disease) stage 4, GFR 15-29 ml/min (HCC)   Elevated troponin I level   Hyponatremia   A-fib ([place])   Cardiac amyloidosis ([place])   Palliative care by specialist   DNR (do not resuscitate)   Pain, generalized   Anxiety state   [place] Hospital Course (including significant findings, care, treatment, and services provided and events leading to death)  Mandy Rhodes is a 77 y.o. year old female with a hx of coronaryartery disease,infiltrative cardiomyopathy with EF of 20-25%, CKD stage IV, and query myelodysplastic syndrome who was admitted with worsening shortness of breath, significant leg edema, orthopnea, and chest tightness.The patient was hypotensive on presentation. On presentation, sodiumwas 129, BUN 109,serum creatinine 2.62.   As per CHF Team "She has progressive multi-system organ failure with refractory hypothermia. Renal function worse. Now with GNR infection withadvanced HF and A/CKD in setting of cardiac amyloidosis prognosis is likely poor. Ido not think she will survive this hospitalization."  She has been seen by both the CHF Team and Palliative Care 06/18/2018.  Palliative Care met with the patient and her family .  She and her family have decided to persue comfort focused care only. Patient was transitioned to comfort care measures only, she was started on p.r.n. Ativan,  Dilaudid, she was kept on IV Lasix, patient passed away this afternoon, family were at bedside,  I have mid family this morning, answer all their questions.   Acute on chronic systolic CHF - cardiac amyloidosis  TTE 06/16/18 noted EF 30-35%, severe concentric hypertrophy, moderate diffuse HK, moderate MR, LA severely dilated, RV moderately reduced, RA severely dilated, moderate TR, mild PI, PA peak pressure 39 mmHg, trivial pericardial effusion  CKD stage IV crt 2.64 05/26/18 - crt has progressively worsened during this admission   Persistent Hypothermia w/ Gram negative rod UTI (Kleb pneumo) Did not respond to abx tx   Elevated troponin  New onset paroxysmal atrial fibrillation    cause of death:    respiratory failure  secondary to:  acute on chronic systolic/ diastolic CHF  cardiac amyloidosis  chronic kidney disease stage 4  urinary tract infection   Pertinent Labs and Studies  Significant Diagnostic Studies Dg Chest 2 View  Result Date: 05/26/2018 CLINICAL DATA:  Chest pain EXAM: CHEST - 2 VIEW COMPARISON:  Apr 16, 2018 FINDINGS: There is persistent left lower lobe consolidation with left pleural effusion. Right lung is clear. There is cardiomegaly with pulmonary vascularity within normal limits. No adenopathy. Bones are osteoporotic. IMPRESSION: Persistent left pleural effusion with left lower lobe consolidation. Lungs elsewhere clear. Stable cardiomegaly. Bones osteoporotic. Electronically Signed   By: Lowella Grip III M.D.   On: 05/26/2018 09:29   Dg Chest Port 1 View  Result Date: 02-Jul-2018 CLINICAL DATA:  Generalized chest pain started at 2000 hours while eating. History of congestive heart failure, coronary artery disease, hypertension EXAM: PORTABLE CHEST 1 VIEW COMPARISON:  05/26/2018 FINDINGS:  Prominent cardiac enlargement. Probable left pleural effusion with left basilar atelectasis or consolidation. Right lung appears clear. No significant change since  previous study. Calcified and tortuous aorta. No pneumothorax. Degenerative changes in the spine and shoulders. IMPRESSION: Prominent cardiac enlargement. Left pleural effusion with left basilar atelectasis or consolidation. No change. Electronically Signed   By: Lucienne Capers M.D.   On: 05/30/2018 21:41    Microbiology Recent Results (from the past 240 hour(s))  MRSA PCR Screening     Status: None   Collection Time: 06/15/18  3:44 AM  Result Value Ref Range Status   MRSA by PCR NEGATIVE NEGATIVE Final    Comment:        The GeneXpert MRSA Assay (FDA approved for NASAL specimens only), is one component of a comprehensive MRSA colonization surveillance program. It is not intended to diagnose MRSA infection nor to guide or monitor treatment for MRSA infections. Performed at [facility], [place] [address] [place], [place] [address]   Culture, Urine     Status: Abnormal   Collection Time: 06/16/18  8:29 AM  Result Value Ref Range Status   Specimen Description URINE, CATHETERIZED  Final   Special Requests   Final    NONE Performed at [facility], [place] [address] [place], [place] [address]    Culture >=100,000 COLONIES/mL KLEBSIELLA PNEUMONIAE (A)  Final   Report Status 07/06/2018 FINAL  Final   Organism ID, Bacteria KLEBSIELLA PNEUMONIAE (A)  Final      Susceptibility   Klebsiella pneumoniae - MIC*    AMPICILLIN >=32 RESISTANT Resistant     CEFAZOLIN <=4 SENSITIVE Sensitive     CEFTRIAXONE <=1 SENSITIVE Sensitive     CIPROFLOXACIN <=0.25 SENSITIVE Sensitive     GENTAMICIN <=1 SENSITIVE Sensitive     IMIPENEM 0.5 SENSITIVE Sensitive     NITROFURANTOIN 32 SENSITIVE Sensitive     TRIMETH/SULFA <=20 SENSITIVE Sensitive     AMPICILLIN/SULBACTAM >=32 RESISTANT Resistant     PIP/TAZO 8 SENSITIVE Sensitive     Extended ESBL NEGATIVE Sensitive     * >=100,000 COLONIES/mL KLEBSIELLA PNEUMONIAE  Culture, blood (routine x 2)     Status: None (Preliminary result)    Collection Time: 06/16/18 11:04 AM  Result Value Ref Range Status   Specimen Description BLOOD RIGHT ARM  Final   Special Requests   Final    BOTTLES DRAWN AEROBIC AND ANAEROBIC Blood Culture results may not be optimal due to an inadequate volume of blood received in culture bottles   Culture   Final    NO GROWTH 2 DAYS Performed at [place] [address], [place], [place] [address]    Report Status PENDING  Incomplete  Culture, blood (routine x 2)     Status: None (Preliminary result)   Collection Time: 06/16/18 11:04 AM  Result Value Ref Range Status   Specimen Description BLOOD RIGHT ARM  Final   Special Requests   Final    BOTTLES DRAWN AEROBIC AND ANAEROBIC Blood Culture results may not be optimal due to an inadequate volume of blood received in culture bottles   Culture   Final    NO GROWTH 2 DAYS Performed at [facility], [place] [address] [place], [place] [address]    Report Status PENDING  Incomplete    Lab Basic Metabolic Panel: Recent Labs  Lab 06/13/18 1145 06/15/18 0034 06/16/18 0656 06/17/18 0444 06/18/18 0455  NA 133* 129* 128* 130* 128*  K  4.4 4.4 4.7 4.5 4.8  CL 91* 93* 93* 93* 93*  CO2 22 21* '23 23 23  ' GLUCOSE 122* 134* 126* 132* 134*  BUN 95* 109* 110* 110* 115*  CREATININE 2.48* 2.62* 2.83* 2.92* 3.26*  CALCIUM 9.8 9.7 9.6 9.6 9.5  MG  --   --   --  2.9*  --   PHOS  --   --   --  5.3*  --    Liver Function Tests: Recent Labs  Lab 06/15/18 0034 06/17/18 0444 06/18/18 0455  AST 19  --  26  ALT 20  --  22  ALKPHOS 134*  --  152*  BILITOT 1.5*  --  1.0  PROT 7.2  --  7.4  ALBUMIN 3.1* 2.8* 3.0*   No results for input(s): LIPASE, AMYLASE in the last 168 hours. No results for input(s): AMMONIA in the last 168 hours. CBC: Recent Labs  Lab 06/13/18 1145 06/08/2018 2159 06/16/18 1032 06/17/18 0444 06/18/18 0455  WBC 6.6 6.3 7.2 5.6 5.4  NEUTROABS  --   --  6.1 4.3  --   HGB 11.3 12.4 12.2 12.3 12.1  HCT 34.8 36.1 35.3*  34.6* 35.7*  MCV 94 91.2 90.3 88.9 90.6  PLT 160 162 153 165 172   Cardiac Enzymes: Recent Labs  Lab 06/15/18 0358  TROPONINI 0.27*   Sepsis Labs: Recent Labs  Lab 05/25/2018 2159 06/16/18 1032 06/17/18 0444 06/18/18 0455  WBC 6.3 7.2 5.6 5.4    Procedures/Operations     Dawood Elgergawy 07-09-18, 12:24 PM

## 2018-06-20 NOTE — Progress Notes (Signed)
Patient ID: Mandy Rhodes, female   DOB: 1941-11-17, 77 y.o.   MRN: [id number]  This NP visited patient at the bedside as a follow up to  yesterday's [place].  Patient is unresponsive, audible throat secretions.      Will increase Robinol dose and frequency,  and schedule Dilaudid  Discussed with family natural trajectory and expectations at EOL.  Discussed with family that with the physical changes the prognosis is likely hours.  Questions and concerns addressed   Emotional  support offered and chaplain consult.  Discussed with bedside nursing       Total time spent on the unit was 25 minutes  PMT will continue to support holistically  Greater than 50% of the time was spent in counseling and coordination of care  Wadie Lessen NP  Palliative Medicine Team Team Phone # [phone] Pager [phone]

## 2018-06-20 NOTE — Progress Notes (Signed)
   06/21/2018 1200  Clinical Encounter Type  Visited With Patient;Patient and family together  Visit Type Initial  Referral From Nurse  Consult/Referral To Chaplain  Spiritual Encounters  Spiritual Needs Prayer;Emotional;Grief support    Pt passed. Family was present and tearful. Chaplain provided prayer and empathic compassionate presence and pt placement card.  Keldon Lassen a Medical sales representative, [employer]

## 2018-06-20 DEATH — deceased

## 2018-06-21 LAB — CULTURE, BLOOD (ROUTINE X 2)
Culture: NO GROWTH
Culture: NO GROWTH

## 2018-06-27 ENCOUNTER — Ambulatory Visit: Payer: Medicare HMO | Admitting: Oncology

## 2018-06-27 ENCOUNTER — Other Ambulatory Visit: Payer: Medicare HMO

## 2018-07-30 ENCOUNTER — Ambulatory Visit: Payer: Medicare HMO | Admitting: Pulmonary Disease

## 2018-09-13 ENCOUNTER — Ambulatory Visit: Payer: Medicare HMO | Admitting: Internal Medicine
